# Patient Record
Sex: Female | Born: 1935 | Race: White | Hispanic: No | Marital: Married | Smoking: Never smoker
Health system: Southern US, Community
[De-identification: ages and names within clinical notes are randomized; demographics above are authoritative.]

## PROBLEM LIST (undated history)

## (undated) DIAGNOSIS — M1990 Unspecified osteoarthritis, unspecified site: Secondary | ICD-10-CM

## (undated) DIAGNOSIS — D126 Benign neoplasm of colon, unspecified: Secondary | ICD-10-CM

## (undated) DIAGNOSIS — K589 Irritable bowel syndrome without diarrhea: Secondary | ICD-10-CM

## (undated) DIAGNOSIS — Z8489 Family history of other specified conditions: Secondary | ICD-10-CM

## (undated) DIAGNOSIS — I739 Peripheral vascular disease, unspecified: Secondary | ICD-10-CM

## (undated) DIAGNOSIS — H269 Unspecified cataract: Secondary | ICD-10-CM

## (undated) DIAGNOSIS — K219 Gastro-esophageal reflux disease without esophagitis: Secondary | ICD-10-CM

## (undated) DIAGNOSIS — E538 Deficiency of other specified B group vitamins: Secondary | ICD-10-CM

## (undated) DIAGNOSIS — D510 Vitamin B12 deficiency anemia due to intrinsic factor deficiency: Secondary | ICD-10-CM

## (undated) DIAGNOSIS — I6529 Occlusion and stenosis of unspecified carotid artery: Secondary | ICD-10-CM

## (undated) DIAGNOSIS — T7840XA Allergy, unspecified, initial encounter: Secondary | ICD-10-CM

## (undated) DIAGNOSIS — I2510 Atherosclerotic heart disease of native coronary artery without angina pectoris: Secondary | ICD-10-CM

## (undated) DIAGNOSIS — E785 Hyperlipidemia, unspecified: Secondary | ICD-10-CM

## (undated) DIAGNOSIS — E039 Hypothyroidism, unspecified: Secondary | ICD-10-CM

## (undated) DIAGNOSIS — M810 Age-related osteoporosis without current pathological fracture: Secondary | ICD-10-CM

## (undated) DIAGNOSIS — R197 Diarrhea, unspecified: Secondary | ICD-10-CM

## (undated) DIAGNOSIS — I774 Celiac artery compression syndrome: Secondary | ICD-10-CM

## (undated) DIAGNOSIS — R011 Cardiac murmur, unspecified: Secondary | ICD-10-CM

## (undated) DIAGNOSIS — Z8719 Personal history of other diseases of the digestive system: Secondary | ICD-10-CM

## (undated) DIAGNOSIS — M797 Fibromyalgia: Secondary | ICD-10-CM

## (undated) DIAGNOSIS — K824 Cholesterolosis of gallbladder: Secondary | ICD-10-CM

## (undated) DIAGNOSIS — Z87442 Personal history of urinary calculi: Secondary | ICD-10-CM

## (undated) DIAGNOSIS — I10 Essential (primary) hypertension: Secondary | ICD-10-CM

## (undated) HISTORY — PX: OTHER SURGICAL HISTORY: SHX169

## (undated) HISTORY — DX: Celiac artery compression syndrome: I77.4

## (undated) HISTORY — DX: Unspecified cataract: H26.9

## (undated) HISTORY — DX: Essential (primary) hypertension: I10

## (undated) HISTORY — PX: COLONOSCOPY: SHX174

## (undated) HISTORY — PX: THYROID LOBECTOMY: SHX420

## (undated) HISTORY — DX: Benign neoplasm of colon, unspecified: D12.6

## (undated) HISTORY — DX: Hypothyroidism, unspecified: E03.9

## (undated) HISTORY — PX: UPPER GASTROINTESTINAL ENDOSCOPY: SHX188

## (undated) HISTORY — PX: ABDOMINAL HYSTERECTOMY: SHX81

## (undated) HISTORY — DX: Occlusion and stenosis of unspecified carotid artery: I65.29

## (undated) HISTORY — DX: Age-related osteoporosis without current pathological fracture: M81.0

## (undated) HISTORY — PX: BIOPSY THYROID: PRO38

## (undated) HISTORY — DX: Vitamin B12 deficiency anemia due to intrinsic factor deficiency: D51.0

## (undated) HISTORY — DX: Cholesterolosis of gallbladder: K82.4

## (undated) HISTORY — DX: Allergy, unspecified, initial encounter: T78.40XA

## (undated) HISTORY — DX: Irritable bowel syndrome, unspecified: K58.9

## (undated) HISTORY — DX: Diarrhea, unspecified: R19.7

## (undated) HISTORY — DX: Peripheral vascular disease, unspecified: I73.9

## (undated) HISTORY — PX: APPENDECTOMY: SHX54

## (undated) HISTORY — DX: Hyperlipidemia, unspecified: E78.5

## (undated) HISTORY — DX: Unspecified osteoarthritis, unspecified site: M19.90

## (undated) HISTORY — DX: Deficiency of other specified B group vitamins: E53.8

## (undated) HISTORY — DX: Atherosclerotic heart disease of native coronary artery without angina pectoris: I25.10

---

## 2000-03-05 ENCOUNTER — Other Ambulatory Visit: Admission: RE | Admit: 2000-03-05 | Discharge: 2000-03-05 | Payer: Self-pay | Admitting: Gynecology

## 2000-04-09 ENCOUNTER — Ambulatory Visit (HOSPITAL_COMMUNITY): Admission: RE | Admit: 2000-04-09 | Discharge: 2000-04-09 | Payer: Self-pay | Admitting: Internal Medicine

## 2000-04-09 ENCOUNTER — Encounter: Payer: Self-pay | Admitting: Internal Medicine

## 2000-05-27 ENCOUNTER — Encounter (INDEPENDENT_AMBULATORY_CARE_PROVIDER_SITE_OTHER): Payer: Self-pay | Admitting: Specialist

## 2000-05-27 ENCOUNTER — Ambulatory Visit (HOSPITAL_COMMUNITY): Admission: RE | Admit: 2000-05-27 | Discharge: 2000-05-27 | Payer: Self-pay | Admitting: Gastroenterology

## 2001-04-29 ENCOUNTER — Ambulatory Visit (HOSPITAL_COMMUNITY): Admission: RE | Admit: 2001-04-29 | Discharge: 2001-04-29 | Payer: Self-pay | Admitting: Internal Medicine

## 2001-04-29 ENCOUNTER — Encounter: Payer: Self-pay | Admitting: Internal Medicine

## 2004-01-25 ENCOUNTER — Other Ambulatory Visit: Admission: RE | Admit: 2004-01-25 | Discharge: 2004-01-25 | Payer: Self-pay | Admitting: Gynecology

## 2004-03-28 ENCOUNTER — Encounter: Payer: Self-pay | Admitting: Internal Medicine

## 2004-03-28 LAB — CONVERTED CEMR LAB

## 2004-07-19 ENCOUNTER — Ambulatory Visit: Payer: Self-pay | Admitting: Internal Medicine

## 2004-07-23 ENCOUNTER — Ambulatory Visit: Payer: Self-pay | Admitting: Internal Medicine

## 2004-07-30 ENCOUNTER — Ambulatory Visit: Payer: Self-pay | Admitting: Internal Medicine

## 2004-08-15 ENCOUNTER — Ambulatory Visit: Payer: Self-pay | Admitting: Gastroenterology

## 2004-08-20 ENCOUNTER — Ambulatory Visit: Payer: Self-pay | Admitting: Gastroenterology

## 2004-09-04 ENCOUNTER — Ambulatory Visit: Payer: Self-pay | Admitting: Gastroenterology

## 2004-09-04 LAB — HM COLONOSCOPY: HM Colonoscopy: NORMAL

## 2005-01-20 ENCOUNTER — Ambulatory Visit: Payer: Self-pay | Admitting: Internal Medicine

## 2005-02-04 ENCOUNTER — Ambulatory Visit: Payer: Self-pay

## 2005-04-28 HISTORY — PX: CORONARY ARTERY BYPASS GRAFT: SHX141

## 2005-05-08 ENCOUNTER — Ambulatory Visit: Payer: Self-pay | Admitting: Internal Medicine

## 2005-08-27 ENCOUNTER — Ambulatory Visit: Payer: Self-pay | Admitting: Internal Medicine

## 2005-09-17 ENCOUNTER — Ambulatory Visit: Payer: Self-pay | Admitting: Internal Medicine

## 2005-09-20 ENCOUNTER — Inpatient Hospital Stay (HOSPITAL_COMMUNITY): Admission: EM | Admit: 2005-09-20 | Discharge: 2005-10-02 | Payer: Self-pay | Admitting: Family Medicine

## 2005-09-20 ENCOUNTER — Ambulatory Visit: Payer: Self-pay | Admitting: Internal Medicine

## 2005-09-24 ENCOUNTER — Encounter: Payer: Self-pay | Admitting: Vascular Surgery

## 2005-10-13 ENCOUNTER — Ambulatory Visit: Payer: Self-pay | Admitting: Cardiology

## 2005-10-18 ENCOUNTER — Ambulatory Visit: Payer: Self-pay | Admitting: Internal Medicine

## 2005-10-23 ENCOUNTER — Encounter (HOSPITAL_COMMUNITY): Admission: RE | Admit: 2005-10-23 | Discharge: 2006-01-21 | Payer: Self-pay | Admitting: Internal Medicine

## 2005-11-19 ENCOUNTER — Ambulatory Visit: Payer: Self-pay | Admitting: Internal Medicine

## 2005-11-28 ENCOUNTER — Ambulatory Visit: Payer: Self-pay | Admitting: Internal Medicine

## 2005-12-03 ENCOUNTER — Ambulatory Visit: Payer: Self-pay

## 2005-12-03 ENCOUNTER — Encounter: Payer: Self-pay | Admitting: Internal Medicine

## 2006-01-22 ENCOUNTER — Encounter (HOSPITAL_COMMUNITY): Admission: RE | Admit: 2006-01-22 | Discharge: 2006-04-22 | Payer: Self-pay | Admitting: Internal Medicine

## 2006-01-27 ENCOUNTER — Ambulatory Visit: Payer: Self-pay | Admitting: Internal Medicine

## 2006-02-05 ENCOUNTER — Ambulatory Visit: Payer: Self-pay | Admitting: Internal Medicine

## 2006-02-05 LAB — CONVERTED CEMR LAB
ALT: 12 units/L (ref 0–40)
AST: 19 units/L (ref 0–37)
BUN: 12 mg/dL (ref 6–23)
Chol/HDL Ratio, serum: 3.6
Cholesterol: 191 mg/dL (ref 0–200)
Creatinine, Ser: 1 mg/dL (ref 0.4–1.2)
HDL: 52.5 mg/dL (ref >39.0)
LDL Cholesterol: 122 mg/dL — ABNORMAL HIGH (ref 0–99)
Potassium: 3.7 meq/L (ref 3.5–5.1)
TSH: 5.11 microintl units/mL (ref 0.35–5.50)
Triglyceride fasting, serum: 85 mg/dL (ref 0–149)
VLDL: 17 mg/dL (ref 0–40)

## 2006-06-29 ENCOUNTER — Ambulatory Visit: Payer: Self-pay | Admitting: Internal Medicine

## 2006-06-29 LAB — CONVERTED CEMR LAB
ALT: 13 units/L (ref 0–40)
AST: 20 units/L (ref 0–37)
Anti Nuclear Antibody(ANA): NEGATIVE
Cholesterol: 196 mg/dL (ref 0–200)
HDL: 82.3 mg/dL (ref >39.0)
LDL Cholesterol: 94 mg/dL (ref 0–99)
Sed Rate: 13 mm/hr (ref 0–25)
Total CHOL/HDL Ratio: 2.4
Triglycerides: 98 mg/dL (ref 0–149)
VLDL: 20 mg/dL (ref 0–40)

## 2006-11-17 ENCOUNTER — Ambulatory Visit: Payer: Self-pay | Admitting: Internal Medicine

## 2006-11-18 ENCOUNTER — Ambulatory Visit: Payer: Self-pay

## 2006-11-23 ENCOUNTER — Ambulatory Visit: Payer: Self-pay | Admitting: Internal Medicine

## 2006-12-16 ENCOUNTER — Ambulatory Visit: Payer: Self-pay | Admitting: Endocrinology

## 2007-01-08 ENCOUNTER — Encounter: Payer: Self-pay | Admitting: Internal Medicine

## 2007-01-08 DIAGNOSIS — Z862 Personal history of diseases of the blood and blood-forming organs and certain disorders involving the immune mechanism: Secondary | ICD-10-CM | POA: Insufficient documentation

## 2007-01-08 DIAGNOSIS — E785 Hyperlipidemia, unspecified: Secondary | ICD-10-CM | POA: Insufficient documentation

## 2007-01-08 DIAGNOSIS — K2940 Chronic atrophic gastritis without bleeding: Secondary | ICD-10-CM | POA: Insufficient documentation

## 2007-01-08 DIAGNOSIS — E039 Hypothyroidism, unspecified: Secondary | ICD-10-CM | POA: Insufficient documentation

## 2007-01-08 DIAGNOSIS — Z87442 Personal history of urinary calculi: Secondary | ICD-10-CM | POA: Insufficient documentation

## 2007-01-08 DIAGNOSIS — I739 Peripheral vascular disease, unspecified: Secondary | ICD-10-CM | POA: Insufficient documentation

## 2007-01-08 DIAGNOSIS — I2510 Atherosclerotic heart disease of native coronary artery without angina pectoris: Secondary | ICD-10-CM | POA: Insufficient documentation

## 2007-01-08 DIAGNOSIS — I10 Essential (primary) hypertension: Secondary | ICD-10-CM | POA: Insufficient documentation

## 2007-01-08 DIAGNOSIS — Z9089 Acquired absence of other organs: Secondary | ICD-10-CM | POA: Insufficient documentation

## 2007-01-08 DIAGNOSIS — Z9079 Acquired absence of other genital organ(s): Secondary | ICD-10-CM | POA: Insufficient documentation

## 2007-01-19 ENCOUNTER — Encounter: Payer: Self-pay | Admitting: Internal Medicine

## 2007-01-19 ENCOUNTER — Ambulatory Visit: Payer: Self-pay | Admitting: Internal Medicine

## 2007-01-25 ENCOUNTER — Ambulatory Visit: Payer: Self-pay | Admitting: Internal Medicine

## 2007-01-25 LAB — CONVERTED CEMR LAB
Glucose, 1 Hour GTT: 197 mg/dL — ABNORMAL HIGH (ref 120–170)
Glucose, 2 hour: 164 mg/dL — ABNORMAL HIGH (ref 70–139)
Glucose, Fasting: 96 mg/dL (ref 70–99)
Hgb A1c MFr Bld: 5.8 % (ref 4.6–6.0)

## 2007-03-12 ENCOUNTER — Ambulatory Visit: Payer: Self-pay | Admitting: Internal Medicine

## 2007-03-12 LAB — CONVERTED CEMR LAB
Basophils Absolute: 0.1 10*3/uL (ref 0.0–0.1)
Basophils Relative: 1.8 % — ABNORMAL HIGH (ref 0.0–1.0)
Eosinophils Absolute: 0.1 10*3/uL (ref 0.0–0.6)
Eosinophils Relative: 1.2 % (ref 0.0–5.0)
HCT: 35.8 % — ABNORMAL LOW (ref 36.0–46.0)
Hemoglobin: 12.1 g/dL (ref 12.0–15.0)
Lymphocytes Relative: 15.8 % (ref 12.0–46.0)
MCHC: 33.9 g/dL (ref 30.0–36.0)
MCV: 83.5 fL (ref 78.0–100.0)
Monocytes Absolute: 0.6 10*3/uL (ref 0.2–0.7)
Monocytes Relative: 9.8 % (ref 3.0–11.0)
Neutro Abs: 4.8 10*3/uL (ref 1.4–7.7)
Neutrophils Relative %: 71.4 % (ref 43.0–77.0)
Platelets: 143 10*3/uL — ABNORMAL LOW (ref 150–400)
RBC: 4.29 M/uL (ref 3.87–5.11)
RDW: 12.9 % (ref 11.5–14.6)
Vitamin B-12: 262 pg/mL (ref 211–911)
WBC: 6.6 10*3/uL (ref 4.5–10.5)

## 2007-03-15 ENCOUNTER — Encounter: Payer: Self-pay | Admitting: Internal Medicine

## 2007-03-18 ENCOUNTER — Encounter: Payer: Self-pay | Admitting: Internal Medicine

## 2007-04-01 LAB — HM PAP SMEAR: HM Pap smear: NORMAL

## 2007-05-04 ENCOUNTER — Ambulatory Visit: Payer: Self-pay | Admitting: Internal Medicine

## 2007-05-06 ENCOUNTER — Ambulatory Visit: Payer: Self-pay | Admitting: Internal Medicine

## 2007-05-11 ENCOUNTER — Ambulatory Visit: Payer: Self-pay | Admitting: Gastroenterology

## 2007-05-11 LAB — CONVERTED CEMR LAB
ALT: 16 units/L (ref 0–35)
AST: 25 units/L (ref 0–37)
Albumin: 3.8 g/dL (ref 3.5–5.2)
Alkaline Phosphatase: 43 units/L (ref 39–117)
BUN: 18 mg/dL (ref 6–23)
Basophils Absolute: 0.1 10*3/uL (ref 0.0–0.1)
Basophils Relative: 2.6 % — ABNORMAL HIGH (ref 0.0–1.0)
Bilirubin, Direct: 0.1 mg/dL (ref 0.0–0.3)
CO2: 30 meq/L (ref 19–32)
Calcium: 9.1 mg/dL (ref 8.4–10.5)
Chloride: 106 meq/L (ref 96–112)
Creatinine, Ser: 0.7 mg/dL (ref 0.4–1.2)
Eosinophils Absolute: 0.1 10*3/uL (ref 0.0–0.6)
Eosinophils Relative: 1.8 % (ref 0.0–5.0)
GFR calc Af Amer: 106 mL/min
GFR calc non Af Amer: 87 mL/min
Glucose, Bld: 106 mg/dL — ABNORMAL HIGH (ref 70–99)
HCT: 35.9 % — ABNORMAL LOW (ref 36.0–46.0)
Hemoglobin: 12.1 g/dL (ref 12.0–15.0)
Lymphocytes Relative: 23.3 % (ref 12.0–46.0)
MCHC: 33.6 g/dL (ref 30.0–36.0)
MCV: 82.3 fL (ref 78.0–100.0)
Monocytes Absolute: 0.3 10*3/uL (ref 0.2–0.7)
Monocytes Relative: 6.8 % (ref 3.0–11.0)
Neutro Abs: 3.1 10*3/uL (ref 1.4–7.7)
Neutrophils Relative %: 65.5 % (ref 43.0–77.0)
Platelets: 162 10*3/uL (ref 150–400)
Potassium: 3.5 meq/L (ref 3.5–5.1)
RBC: 4.36 M/uL (ref 3.87–5.11)
RDW: 14.4 % (ref 11.5–14.6)
Sodium: 143 meq/L (ref 135–145)
TSH: 1.28 microintl units/mL (ref 0.35–5.50)
Total Bilirubin: 0.7 mg/dL (ref 0.3–1.2)
Total Protein: 6 g/dL (ref 6.0–8.3)
WBC: 4.7 10*3/uL (ref 4.5–10.5)

## 2007-05-14 ENCOUNTER — Ambulatory Visit: Payer: Self-pay | Admitting: Gastroenterology

## 2007-05-14 ENCOUNTER — Encounter: Payer: Self-pay | Admitting: Internal Medicine

## 2007-05-17 ENCOUNTER — Ambulatory Visit (HOSPITAL_COMMUNITY): Admission: RE | Admit: 2007-05-17 | Discharge: 2007-05-17 | Payer: Self-pay | Admitting: Gastroenterology

## 2007-05-17 ENCOUNTER — Ambulatory Visit: Payer: Self-pay | Admitting: Internal Medicine

## 2007-05-20 ENCOUNTER — Ambulatory Visit: Payer: Self-pay | Admitting: Gastroenterology

## 2007-05-20 LAB — CONVERTED CEMR LAB: CA 125: 7.1 units/mL (ref 0.0–30.2)

## 2007-05-25 ENCOUNTER — Ambulatory Visit: Payer: Self-pay | Admitting: Cardiology

## 2007-05-27 ENCOUNTER — Encounter: Payer: Self-pay | Admitting: Internal Medicine

## 2007-05-31 ENCOUNTER — Telehealth: Payer: Self-pay | Admitting: Internal Medicine

## 2007-06-21 ENCOUNTER — Ambulatory Visit: Payer: Self-pay | Admitting: Internal Medicine

## 2007-06-24 ENCOUNTER — Encounter: Payer: Self-pay | Admitting: Internal Medicine

## 2007-07-01 ENCOUNTER — Ambulatory Visit: Payer: Self-pay | Admitting: Cardiovascular Disease

## 2007-07-19 ENCOUNTER — Encounter: Payer: Self-pay | Admitting: Internal Medicine

## 2007-09-02 ENCOUNTER — Ambulatory Visit: Payer: Self-pay | Admitting: *Deleted

## 2007-09-24 ENCOUNTER — Ambulatory Visit: Payer: Self-pay | Admitting: Internal Medicine

## 2007-09-24 DIAGNOSIS — M79609 Pain in unspecified limb: Secondary | ICD-10-CM | POA: Insufficient documentation

## 2007-09-30 ENCOUNTER — Ambulatory Visit: Payer: Self-pay

## 2007-10-25 ENCOUNTER — Ambulatory Visit: Payer: Self-pay | Admitting: Internal Medicine

## 2007-10-25 DIAGNOSIS — I774 Celiac artery compression syndrome: Secondary | ICD-10-CM | POA: Insufficient documentation

## 2007-12-15 ENCOUNTER — Telehealth: Payer: Self-pay | Admitting: Internal Medicine

## 2007-12-26 ENCOUNTER — Telehealth: Payer: Self-pay | Admitting: Internal Medicine

## 2007-12-31 ENCOUNTER — Ambulatory Visit: Payer: Self-pay | Admitting: Internal Medicine

## 2008-01-07 ENCOUNTER — Ambulatory Visit: Payer: Self-pay

## 2008-01-25 ENCOUNTER — Telehealth: Payer: Self-pay | Admitting: Internal Medicine

## 2008-02-14 ENCOUNTER — Telehealth: Payer: Self-pay | Admitting: Internal Medicine

## 2008-02-18 ENCOUNTER — Ambulatory Visit: Payer: Self-pay | Admitting: Internal Medicine

## 2008-03-04 ENCOUNTER — Encounter: Payer: Self-pay | Admitting: Internal Medicine

## 2008-04-13 ENCOUNTER — Ambulatory Visit: Payer: Self-pay | Admitting: *Deleted

## 2008-07-19 ENCOUNTER — Telehealth: Payer: Self-pay | Admitting: Internal Medicine

## 2008-08-15 ENCOUNTER — Telehealth: Payer: Self-pay | Admitting: Internal Medicine

## 2008-08-18 ENCOUNTER — Ambulatory Visit: Payer: Self-pay | Admitting: Internal Medicine

## 2008-08-19 ENCOUNTER — Encounter: Payer: Self-pay | Admitting: Internal Medicine

## 2008-08-21 LAB — CONVERTED CEMR LAB
ALT: 27 units/L (ref 0–35)
AST: 40 units/L — ABNORMAL HIGH (ref 0–37)
Albumin: 4.1 g/dL (ref 3.5–5.2)
Alkaline Phosphatase: 57 units/L (ref 39–117)
BUN: 20 mg/dL (ref 6–23)
Basophils Absolute: 0 10*3/uL (ref 0.0–0.1)
Basophils Relative: 1 % (ref 0.0–3.0)
Bilirubin Urine: NEGATIVE
Bilirubin, Direct: 0.1 mg/dL (ref 0.0–0.3)
CO2: 32 meq/L (ref 19–32)
Calcium: 8.9 mg/dL (ref 8.4–10.5)
Chloride: 107 meq/L (ref 96–112)
Creatinine, Ser: 0.7 mg/dL (ref 0.4–1.2)
Eosinophils Absolute: 0.1 10*3/uL (ref 0.0–0.7)
Eosinophils Relative: 2.1 % (ref 0.0–5.0)
GFR calc non Af Amer: 86.99 mL/min (ref >60)
Glucose, Bld: 97 mg/dL (ref 70–99)
H Pylori IgG: NEGATIVE
HCT: 35.4 % — ABNORMAL LOW (ref 36.0–46.0)
Hemoglobin: 12 g/dL (ref 12.0–15.0)
Hgb A1c MFr Bld: 6 % (ref 4.6–6.5)
Ketones, ur: NEGATIVE mg/dL
Leukocytes, UA: NEGATIVE
Lipase: 46 units/L (ref 11.0–59.0)
Lymphocytes Relative: 20.2 % (ref 12.0–46.0)
Lymphs Abs: 0.8 10*3/uL (ref 0.7–4.0)
MCHC: 33.8 g/dL (ref 30.0–36.0)
MCV: 86.6 fL (ref 78.0–100.0)
Monocytes Absolute: 0.3 10*3/uL (ref 0.1–1.0)
Monocytes Relative: 7.7 % (ref 3.0–12.0)
Neutro Abs: 2.9 10*3/uL (ref 1.4–7.7)
Neutrophils Relative %: 69 % (ref 43.0–77.0)
Nitrite: NEGATIVE
Platelets: 161 10*3/uL (ref 150.0–400.0)
Potassium: 3.4 meq/L — ABNORMAL LOW (ref 3.5–5.1)
RBC: 4.08 M/uL (ref 3.87–5.11)
RDW: 13.4 % (ref 11.5–14.6)
Sodium: 146 meq/L — ABNORMAL HIGH (ref 135–145)
Specific Gravity, Urine: <=1.005 (ref 1.000–1.030)
TSH: 0.84 microintl units/mL (ref 0.35–5.50)
Total Bilirubin: 0.8 mg/dL (ref 0.3–1.2)
Total Protein, Urine: NEGATIVE mg/dL
Total Protein: 7.1 g/dL (ref 6.0–8.3)
Urine Glucose: NEGATIVE mg/dL
Urobilinogen, UA: 0.2 (ref 0.0–1.0)
WBC: 4.1 10*3/uL — ABNORMAL LOW (ref 4.5–10.5)
pH: 6.5 (ref 5.0–8.0)

## 2008-08-22 ENCOUNTER — Ambulatory Visit: Payer: Self-pay | Admitting: Gastroenterology

## 2008-09-20 ENCOUNTER — Ambulatory Visit: Payer: Self-pay | Admitting: Internal Medicine

## 2008-09-27 ENCOUNTER — Ambulatory Visit: Payer: Self-pay | Admitting: Internal Medicine

## 2008-09-27 DIAGNOSIS — H8110 Benign paroxysmal vertigo, unspecified ear: Secondary | ICD-10-CM | POA: Insufficient documentation

## 2008-09-28 ENCOUNTER — Ambulatory Visit: Payer: Self-pay | Admitting: *Deleted

## 2009-01-16 ENCOUNTER — Ambulatory Visit: Payer: Self-pay

## 2009-01-16 ENCOUNTER — Encounter: Payer: Self-pay | Admitting: Internal Medicine

## 2009-01-18 ENCOUNTER — Telehealth: Payer: Self-pay | Admitting: Internal Medicine

## 2009-01-19 ENCOUNTER — Telehealth: Payer: Self-pay | Admitting: Internal Medicine

## 2009-02-01 ENCOUNTER — Telehealth: Payer: Self-pay | Admitting: Internal Medicine

## 2009-02-05 ENCOUNTER — Ambulatory Visit: Payer: Self-pay | Admitting: Internal Medicine

## 2009-02-05 DIAGNOSIS — I498 Other specified cardiac arrhythmias: Secondary | ICD-10-CM | POA: Insufficient documentation

## 2009-02-07 LAB — CONVERTED CEMR LAB
ALT: 21 units/L (ref 0–35)
AST: 31 units/L (ref 0–37)
Albumin: 4.4 g/dL (ref 3.5–5.2)
Alkaline Phosphatase: 52 units/L (ref 39–117)
BUN: 16 mg/dL (ref 6–23)
Basophils Absolute: 0 10*3/uL (ref 0.0–0.1)
Basophils Relative: 0.6 % (ref 0.0–3.0)
Bilirubin, Direct: 0 mg/dL (ref 0.0–0.3)
CO2: 29 meq/L (ref 19–32)
Calcium: 9.5 mg/dL (ref 8.4–10.5)
Chloride: 103 meq/L (ref 96–112)
Creatinine, Ser: 0.9 mg/dL (ref 0.4–1.2)
Eosinophils Absolute: 0.1 10*3/uL (ref 0.0–0.7)
Eosinophils Relative: 1.1 % (ref 0.0–5.0)
GFR calc non Af Amer: 65 mL/min (ref >60)
Glucose, Bld: 80 mg/dL (ref 70–99)
HCT: 35.2 % — ABNORMAL LOW (ref 36.0–46.0)
Hemoglobin: 11.8 g/dL — ABNORMAL LOW (ref 12.0–15.0)
Lymphocytes Relative: 21.6 % (ref 12.0–46.0)
Lymphs Abs: 1.1 10*3/uL (ref 0.7–4.0)
MCHC: 33.5 g/dL (ref 30.0–36.0)
MCV: 87.1 fL (ref 78.0–100.0)
Monocytes Absolute: 0.5 10*3/uL (ref 0.1–1.0)
Monocytes Relative: 10.6 % (ref 3.0–12.0)
Neutro Abs: 3.4 10*3/uL (ref 1.4–7.7)
Neutrophils Relative %: 66.1 % (ref 43.0–77.0)
Platelets: 168 10*3/uL (ref 150.0–400.0)
Potassium: 4.3 meq/L (ref 3.5–5.1)
RBC: 4.04 M/uL (ref 3.87–5.11)
RDW: 14 % (ref 11.5–14.6)
Sodium: 142 meq/L (ref 135–145)
TSH: 0.62 microintl units/mL (ref 0.35–5.50)
Total Bilirubin: 0.8 mg/dL (ref 0.3–1.2)
Total Protein: 7.4 g/dL (ref 6.0–8.3)
WBC: 5.1 10*3/uL (ref 4.5–10.5)

## 2009-02-19 ENCOUNTER — Telehealth: Payer: Self-pay | Admitting: Internal Medicine

## 2009-03-15 ENCOUNTER — Telehealth: Payer: Self-pay | Admitting: Internal Medicine

## 2009-03-19 ENCOUNTER — Ambulatory Visit: Payer: Self-pay | Admitting: Internal Medicine

## 2009-04-16 ENCOUNTER — Telehealth: Payer: Self-pay | Admitting: Internal Medicine

## 2009-05-16 ENCOUNTER — Ambulatory Visit: Payer: Self-pay | Admitting: Family Medicine

## 2009-05-16 ENCOUNTER — Encounter: Payer: Self-pay | Admitting: Internal Medicine

## 2009-05-28 ENCOUNTER — Encounter: Payer: Self-pay | Admitting: Internal Medicine

## 2009-06-07 ENCOUNTER — Ambulatory Visit: Payer: Self-pay | Admitting: Internal Medicine

## 2009-06-07 DIAGNOSIS — M810 Age-related osteoporosis without current pathological fracture: Secondary | ICD-10-CM | POA: Insufficient documentation

## 2009-06-07 LAB — CONVERTED CEMR LAB
Calcium: 9.5 mg/dL (ref 8.4–10.5)
Creatinine, Ser: 0.8 mg/dL (ref 0.4–1.2)

## 2009-06-08 ENCOUNTER — Telehealth: Payer: Self-pay | Admitting: Internal Medicine

## 2009-06-12 ENCOUNTER — Encounter: Payer: Self-pay | Admitting: Internal Medicine

## 2009-06-28 ENCOUNTER — Ambulatory Visit (HOSPITAL_COMMUNITY): Admission: RE | Admit: 2009-06-28 | Discharge: 2009-06-28 | Payer: Self-pay | Admitting: Orthopedic Surgery

## 2009-07-30 ENCOUNTER — Ambulatory Visit: Payer: Self-pay | Admitting: Internal Medicine

## 2009-07-30 LAB — CONVERTED CEMR LAB
Bilirubin Urine: NEGATIVE
Blood in Urine, dipstick: NEGATIVE
Glucose, Urine, Semiquant: NEGATIVE
Ketones, urine, test strip: NEGATIVE
Nitrite: NEGATIVE
Protein, U semiquant: NEGATIVE
Specific Gravity, Urine: <1.005
Urobilinogen, UA: 0.2
WBC Urine, dipstick: NEGATIVE
pH: 5

## 2009-08-14 ENCOUNTER — Encounter: Payer: Self-pay | Admitting: Internal Medicine

## 2009-08-27 ENCOUNTER — Ambulatory Visit: Payer: Self-pay | Admitting: Internal Medicine

## 2009-08-27 DIAGNOSIS — I6529 Occlusion and stenosis of unspecified carotid artery: Secondary | ICD-10-CM | POA: Insufficient documentation

## 2009-09-20 ENCOUNTER — Telehealth: Payer: Self-pay | Admitting: Internal Medicine

## 2009-10-19 ENCOUNTER — Telehealth: Payer: Self-pay | Admitting: Internal Medicine

## 2010-01-01 ENCOUNTER — Encounter: Payer: Self-pay | Admitting: Internal Medicine

## 2010-01-01 ENCOUNTER — Ambulatory Visit: Payer: Self-pay

## 2010-01-02 ENCOUNTER — Ambulatory Visit: Payer: Self-pay | Admitting: Internal Medicine

## 2010-01-02 DIAGNOSIS — IMO0002 Reserved for concepts with insufficient information to code with codable children: Secondary | ICD-10-CM | POA: Insufficient documentation

## 2010-01-08 ENCOUNTER — Telehealth: Payer: Self-pay | Admitting: Internal Medicine

## 2010-01-09 ENCOUNTER — Ambulatory Visit: Payer: Self-pay | Admitting: Internal Medicine

## 2010-01-09 ENCOUNTER — Telehealth (INDEPENDENT_AMBULATORY_CARE_PROVIDER_SITE_OTHER): Payer: Self-pay | Admitting: *Deleted

## 2010-02-26 LAB — HM MAMMOGRAPHY: HM Mammogram: NORMAL

## 2010-04-08 ENCOUNTER — Telehealth: Payer: Self-pay | Admitting: Internal Medicine

## 2010-04-24 ENCOUNTER — Telehealth: Payer: Self-pay | Admitting: Internal Medicine

## 2010-05-20 ENCOUNTER — Encounter: Payer: Self-pay | Admitting: Internal Medicine

## 2010-05-28 NOTE — Progress Notes (Signed)
Summary: RESULTS  Phone Note Call from Patient Call back at Home Phone [phone]   Caller: Patient Call For: Jacques Navy MD Summary of Call: Pt requesting results of xrays done last week. Please advise. Initial call taken by: Verdell Face,  January 08, 2010 10:49 AM  Follow-up for Phone Call        called pt - still with a lot of pain. will see tomorrow at 11:15 Follow-up by: Jacques Navy MD,  January 08, 2010 6:36 PM

## 2010-05-28 NOTE — Progress Notes (Signed)
  Phone Note Refill Request Message from:  Fax from Pharmacy on Sep 20, 2009 4:27 PM  Refills Requested: Medication #1:  FUROSEMIDE 40 MG  TABS 1 by mouth  once daily Initial call taken by: Ami Bullins CMA,  Sep 20, 2009 4:27 PM    Prescriptions: FUROSEMIDE 40 MG  TABS (FUROSEMIDE) 1 by mouth  once daily  #30 x 6   Entered by:   Ami Bullins CMA   Authorized by:   Jacques Navy MD   Signed by:   Bill Salinas CMA on 09/20/2009   Method used:   Electronically to        [facility]* (retail)       [address]       [place], [place]  [address]       Ph: [phone]       Fax: [phone]   RxID:   [id number]

## 2010-05-28 NOTE — Procedures (Signed)
Summary: Colonoscopy   Colonoscopy  Procedure date:  09/04/2004  Findings:      Results: Normal. Location:  [facility].    Procedures Next Due Date:    Colonoscopy: 08/2009  Patient Name: [name], [name] MRN: [id number] Procedure Procedures: Colonoscopy CPT: 40981.  Personnel: Endoscopist: Vania Rea. Jarold Motto, MD.  Exam Location: Exam performed in Outpatient Clinic. Outpatient  Patient Consent: Procedure, Alternatives, Risks and Benefits discussed, consent obtained, from patient. Consent was obtained by the RN.  Indications  Surveillance of: Adenomatous Polyp(s).  History  Current Medications: Patient is not currently taking Coumadin.  Pre-Exam Physical: Performed Sep 04, 2004. Cardio-pulmonary exam, Rectal exam, Abdominal exam, Extremity exam, Mental status exam WNL.  Exam Exam: Extent of exam reached: Cecum, extent intended: Cecum.  The cecum was identified by appendiceal orifice and IC valve. Patient position: on left side. Duration of exam: 20 minutes. Colon retroflexion performed. Images taken. ASA Classification: II. Tolerance: excellent.  Monitoring: Pulse and BP monitoring, Oximetry used. Supplemental O2 given. at 2 Liters.  Colon Prep Used Golytely for colon prep. Prep results: excellent.  Sedation Meds: Patient assessed and found to be appropriate for moderate (conscious) sedation. Fentanyl 50 mcg. given IV. Versed 5 mg. given IV.  Instrument(s): CF 140L. Serial [id number].  Findings - NORMAL EXAM: Cecum to Rectum. Not Seen: Polyps. AVM's. Colitis. Tumors. Melanosis. Crohn's. Diverticulosis. Hemorrhoids.   Assessment Normal examination.  Events  Unplanned Interventions: No intervention was required.  Plans Medication Plan: Referring provider to order medications.  Patient Education: Patient given standard instructions for: Patient instructed to get routine colonoscopy every 5 years.  Disposition: After procedure patient  sent to recovery. After recovery patient sent home.  Scheduling/Referral: Follow-Up prn.   CC:   Illene Regulus, MD  This report was created from the original endoscopy report, which was reviewed and signed by the above listed endoscopist.

## 2010-05-28 NOTE — Progress Notes (Signed)
Summary: benicar  Phone Note Refill Request Message from:  Fax from Pharmacy on August 15, 2008 4:18 PM  Refills Requested: Medication #1:  BENICAR 40 MG TABS Take 1 tablet by mouth once a day   Last Refilled: 07/15/2008 [address] [phone]   Method Requested: Electronic Initial call taken by: Orlan Leavens,  August 15, 2008 4:18 PM      Prescriptions: BENICAR 40 MG TABS (OLMESARTAN MEDOXOMIL) Take 1 tablet by mouth once a day  #30 x 2   Entered by:   Orlan Leavens   Authorized by:   Jacques Navy MD   Signed by:   Orlan Leavens on 08/15/2008   Method used:   Electronically to        [facility]* (retail)       [address]       [place], [place]  [address]       Ph: [phone]       Fax: [phone]   RxID:   [id number]

## 2010-05-28 NOTE — Assessment & Plan Note (Signed)
Summary: per flag/md 1115 appt--stc   Vital Signs:  Patient profile:   77 year old female Height:      65 inches Weight:      111 pounds BMI:     18.54 O2 Sat:      98 % on Room air Temp:     98.1 degrees F oral Pulse rate:   54 / minute BP sitting:   140 / 64  (left arm) Cuff size:   regular  Vitals Entered By: Bill Salinas CMA (January 09, 2010 11:55 AM)  O2 Flow:  Room air CC: follow up visit on back pain/ ab   Primary Care Provider:  Illene Regulus, MD  CC:  follow up visit on back pain/ ab.  History of Present Illness: Patient seen by Dr. Yetta Barre for back pain - note reviewed. She did come to have lumbar spine films that reveal DDD L3-4. She was started on etodolac and Vicodin. She still has pain but it does seem a little better. She describes a shooting discomfort  down the anterior thigh, to the knee, to the foot. She also has pain in the right buttock. She denies muscle weakness, loss of sensation. Her discomfort has limited her activities.   Current Medications (verified): 1)  Benicar 40 Mg Tabs (Olmesartan Medoxomil) .... Take 1 Tablet By Mouth Once A Day 2)  Synthroid 75 Mcg Tabs (Levothyroxine Sodium) .... Take One Tablet By Mouth Once Daily. 3)  Aspirin 81 Mg  Tabs (Aspirin) .... Take One Tablet Daily 4)  Carafate 1 Gm  Tabs (Sucralfate) .... Take As Needed 5)  Neuro B-12 Forte S 1000 Mcg/ml Inj Soln (Cyanocobalamin) .... Take Monthly 6)  Coreg 12.5 Mg Tabs (Carvedilol) .... 1/2 Tab  By Mouth Two Times A Day 7)  Ayr Saline Nasal Drops 0.65 %  Soln (Saline) .... As Needed 8)  Furosemide 40 Mg  Tabs (Furosemide) .Marland Kitchen.. 1 By Mouth  Once Daily 9)  Actos 30 Mg  Tabs (Pioglitazone Hcl) .... Take 1 Tablet By Mouth Once A Day 10)  Lipitor 80 Mg  Tabs (Atorvastatin Calcium) .... Once Daily 11)  Florastor 250 Mg Caps (Saccharomyces Boulardii) .... Take One By Mouth Once Daily 12)  Norvasc 10 Mg Tabs (Amlodipine Besylate) .... Take 1 Tablet By Mouth Once A Day 13)  Calcium  .Marland Kitchen.. 1 By Mouth Daily 14)  Icaps  Caps (Multiple Vitamins-Minerals) .... Take 1 Capsule By Mouth Once A Day. 15)  Etodolac 400 Mg Tabs (Etodolac) .... One By Mouth Two Times A Day With Food As Needed For Low Back Pain 16)  Hydrocodone-Acetaminophen 7.5-500 Mg Tabs (Hydrocodone-Acetaminophen) .... One By Mouth Three Times A Day As Needed For Low Back Pain  Allergies (verified): 1)  ! Sulfa 2)  ! Codeine  Past History:  Past Medical History: Last updated: 08/27/2009 APPENDECTOMY, HX OF (ICD-V45.79) HYSTERECTOMY, HX OF (ICD-V45.77) RENAL CALCULUS, HX OF (ICD-V13.01) PERIPHERAL VASCULAR DISEASE (ICD-443.9) ANEMIA, PERNICIOUS, HX OF (ICD-V12.3) ATROPHIC GASTRITIS (ICD-535.10) HYPOTHYROIDISM (ICD-244.9) HYPERLIPIDEMIA (ICD-272.4) HYPERTENSION (ICD-401.9) CORONARY ARTERY DISEASE (ICD-414.00) s/p cabg Carotid artery stenosis    --u/s 9/10: 40-59% Bilaterally. stable.  Weight loss Median Arcuate Ligament - follwed by Dr. Madilyn Fireman.   Past Surgical History: Last updated: 08/22/2008 Coronary artery bypass graft June '07 LIMA-LAD, SVX-Cx Hysterectomy needle bx thyroid Appendectomy thyroid right lobectomy  Family History: Last updated: 08/22/2008 Family History Hypertension Family History of Breast Cancer: Neice No FH of Colon Cancer: Family History of Prostate Cancer:  Brother Family History of Colon  Polyps: Aunt Family History of Diabetes: Grandmother, Aunt, Uncle Family History of Heart Disease: Mother Family History of Kidney Disease: Sister??  Social History: Last updated: 08/22/2008 Retired Never Smoked Alcohol Use - no Illicit Drug Use - no Patient gets regular exercise.  Review of Systems       The patient complains of difficulty walking.  The patient denies anorexia, fever, weight loss, weight gain, decreased hearing, chest pain, syncope, dyspnea on exertion, headaches, abdominal pain, incontinence, muscle weakness, abnormal bleeding, and enlarged lymph nodes.     Physical Exam  General:  Well-developed,well-nourished,in no acute distress; alert,appropriate and cooperative throughout examination Head:  normocephalic and atraumatic.   Eyes:  C&S clear Neck:  supple.   Lungs:  normal respiratory effort.   Heart:  normal rate and regular rhythm.   Msk:  back exam: able to stand w/o assist; flex to 100 degrees before pain; nl gait, toe walk. Mild discomfort in the back with heel walk. Able to step-up to exam table w/o assist. Nl SLR sitting, nl DTR patellar tendon. Nl sensation to light touch. Decreased deep vibratory sensation at right ankle and foot. No CVAT. Minimal tenderness to deep palpation right buttock.  Neurologic:  alert & oriented X3 and cranial nerves II-XII intact.   Skin:  turgor normal and color normal.   Psych:  Oriented X3, normally interactive, and good eye contact.     Impression & Recommendations:  Problem # 1:  LOW BACK PAIN, ACUTE (ICD-724.2) Patient with low back pain and DDD at L3-4 by plain x-ray. Exam without radiculopathy! Pain is better  Plan - no indication of radiculopathy or need for MRI           continue NSAID and vicodin-limit as much as possible           OK to resume stretch and exercise           call for increased pain or progressive paresthesia.  Her updated medication list for this problem includes:    Aspirin 81 Mg Tabs (Aspirin) .Marland Kitchen... Take one tablet daily    Etodolac 400 Mg Tabs (Etodolac) ..... One by mouth two times a day with food as needed for low back pain    Hydrocodone-acetaminophen 7.5-500 Mg Tabs (Hydrocodone-acetaminophen) ..... One by mouth three times a day as needed for low back pain  Complete Medication List: 1)  Benicar 40 Mg Tabs (Olmesartan medoxomil) .... Take 1 tablet by mouth once a day 2)  Synthroid 75 Mcg Tabs (Levothyroxine sodium) .... Take one tablet by mouth once daily. 3)  Aspirin 81 Mg Tabs (Aspirin) .... Take one tablet daily 4)  Carafate 1 Gm Tabs (Sucralfate) .... Take  as needed 5)  Neuro B-12 Forte S 1000 Mcg/ml Inj Soln (Cyanocobalamin) .... Take monthly 6)  Coreg 12.5 Mg Tabs (Carvedilol) .... 1/2 tab  by mouth two times a day 7)  Ayr Saline Nasal Drops 0.65 % Soln (Saline) .... As needed 8)  Furosemide 40 Mg Tabs (Furosemide) .Marland Kitchen.. 1 by mouth  once daily 9)  Actos 30 Mg Tabs (Pioglitazone hcl) .... Take 1 tablet by mouth once a day 10)  Lipitor 80 Mg Tabs (Atorvastatin calcium) .... Once daily 11)  Florastor 250 Mg Caps (Saccharomyces boulardii) .... Take one by mouth once daily 12)  Norvasc 10 Mg Tabs (Amlodipine besylate) .... Take 1 tablet by mouth once a day 13)  Calcium  .Marland KitchenMarland Kitchen. 1 by mouth daily 14)  Icaps Caps (Multiple vitamins-minerals) .... Take 1 capsule  by mouth once a day. 15)  Etodolac 400 Mg Tabs (Etodolac) .... One by mouth two times a day with food as needed for low back pain 16)  Hydrocodone-acetaminophen 7.5-500 Mg Tabs (Hydrocodone-acetaminophen) .... One by mouth three times a day as needed for low back pain

## 2010-05-28 NOTE — Op Note (Signed)
Summary: [facility]  [facility]   Imported By: Lester [place] 06/14/2009 09:06:17  _____________________________________________________________________  External Attachment:    Type:   Image     Comment:   External Document

## 2010-05-28 NOTE — Miscellaneous (Signed)
Summary: Orders Update   Clinical Lists Changes  Orders: Added new Referral order of Radiology Referral (Radiology) - Signed 

## 2010-05-28 NOTE — Medication Information (Signed)
Summary: [facility]   Imported By: Lester [place] 06/14/2009 09:03:27  _____________________________________________________________________  External Attachment:    Type:   Image     Comment:   External Document

## 2010-05-28 NOTE — Assessment & Plan Note (Signed)
Summary: DR MEN PT/NO SLOT--LOWER BACK PAIN---STC--Rm 9   Vital Signs:  Patient profile:   77 year old female Height:      65 inches Weight:      110.25 pounds BMI:     18.41 O2 Sat:      96 % on Room air Temp:     98.0 degrees F oral Pulse rate:   60 / minute Pulse rhythm:   regular Resp:     18 per minute BP sitting:   130 / 54  (left arm) Cuff size:   regular  Vitals Entered By: Mervin Kung CMA Duncan Dull) (January 02, 2010 11:37 AM)  O2 Flow:  Room air CC: Rm 9  Pt states she has low back pain radiating to right leg x 1 week. OTC Tylenol not helping the pain., Back pain Pain Assessment Patient in pain? yes     Location: lower back pain radiating to right leg Onset of pain  x 1 week   Primary Care Provider:  Illene Regulus, MD  CC:  Rm 9  Pt states she has low back pain radiating to right leg x 1 week. OTC Tylenol not helping the pain. and Back pain.  History of Present Illness:  Back Pain      This is a 77 year old woman who presents with Back pain.  The symptoms began 1 week ago.  The intensity is described as moderate.  The patient reports rest pain, but denies fever, chills, weakness, loss of sensation, fecal incontinence, urinary incontinence, urinary retention, dysuria, inability to work, and inability to care for self.  The pain is located in the right low back.  The pain began gradually.  The pain radiates to the right leg below the knee.  The pain is made worse by flexion.  The pain is made better by inactivity and heat.  Risk factors for serious underlying conditions include age >= 50 years and history of osteoporosis.    Preventive Screening-Counseling & Management  Alcohol-Tobacco     Alcohol drinks/day: 0     Smoking Status: never     Tobacco Counseling: not indicated; no tobacco use  Hep-HIV-STD-Contraception     Hepatitis Risk: no risk noted     HIV Risk: no risk noted     STD Risk: no risk noted      Drug Use:  no.    Medications Prior to  Update: 1)  Benicar 40 Mg Tabs (Olmesartan Medoxomil) .... Take 1 Tablet By Mouth Once A Day 2)  Synthroid 75 Mcg Tabs (Levothyroxine Sodium) .... Take One Tablet By Mouth Once Daily. 3)  Aspirin 81 Mg  Tabs (Aspirin) .... Take One Tablet Daily 4)  Carafate 1 Gm  Tabs (Sucralfate) .... Take As Needed 5)  Neuro B-12 Forte S 1000 Mcg/ml Inj Soln (Cyanocobalamin) .... Take Monthly 6)  Coreg 12.5 Mg Tabs (Carvedilol) .... 1/2 Tab  By Mouth Two Times A Day 7)  Ayr Saline Nasal Drops 0.65 %  Soln (Saline) .... As Needed 8)  Furosemide 40 Mg  Tabs (Furosemide) .Marland Kitchen.. 1 By Mouth  Once Daily 9)  Actos 30 Mg  Tabs (Pioglitazone Hcl) .... Take 1 Tablet By Mouth Once A Day 10)  Lipitor 80 Mg  Tabs (Atorvastatin Calcium) .... Once Daily 11)  Florastor 250 Mg Caps (Saccharomyces Boulardii) .... Take One By Mouth Once Daily 12)  Norvasc 10 Mg Tabs (Amlodipine Besylate) .... Take 1 Tablet By Mouth Once A Day 13)  Calcium .Marland KitchenMarland KitchenMarland Kitchen  1 By Mouth Daily  Current Medications (verified): 1)  Benicar 40 Mg Tabs (Olmesartan Medoxomil) .... Take 1 Tablet By Mouth Once A Day 2)  Synthroid 75 Mcg Tabs (Levothyroxine Sodium) .... Take One Tablet By Mouth Once Daily. 3)  Aspirin 81 Mg  Tabs (Aspirin) .... Take One Tablet Daily 4)  Carafate 1 Gm  Tabs (Sucralfate) .... Take As Needed 5)  Neuro B-12 Forte S 1000 Mcg/ml Inj Soln (Cyanocobalamin) .... Take Monthly 6)  Coreg 12.5 Mg Tabs (Carvedilol) .... 1/2 Tab  By Mouth Two Times A Day 7)  Ayr Saline Nasal Drops 0.65 %  Soln (Saline) .... As Needed 8)  Furosemide 40 Mg  Tabs (Furosemide) .Marland Kitchen.. 1 By Mouth  Once Daily 9)  Actos 30 Mg  Tabs (Pioglitazone Hcl) .... Take 1 Tablet By Mouth Once A Day 10)  Lipitor 80 Mg  Tabs (Atorvastatin Calcium) .... Once Daily 11)  Florastor 250 Mg Caps (Saccharomyces Boulardii) .... Take One By Mouth Once Daily 12)  Norvasc 10 Mg Tabs (Amlodipine Besylate) .... Take 1 Tablet By Mouth Once A Day 13)  Calcium .Marland Kitchen.. 1 By Mouth Daily 14)  Icaps  Caps  (Multiple Vitamins-Minerals) .... Take 1 Capsule By Mouth Once A Day. 15)  Etodolac 400 Mg Tabs (Etodolac) .... One By Mouth Two Times A Day With Food As Needed For Low Back Pain 16)  Hydrocodone-Acetaminophen 7.5-500 Mg Tabs (Hydrocodone-Acetaminophen) .... One By Mouth Three Times A Day As Needed For Low Back Pain  Allergies: 1)  ! Sulfa 2)  ! Codeine  Past History:  Past Medical History: Last updated: 08/27/2009 APPENDECTOMY, HX OF (ICD-V45.79) HYSTERECTOMY, HX OF (ICD-V45.77) RENAL CALCULUS, HX OF (ICD-V13.01) PERIPHERAL VASCULAR DISEASE (ICD-443.9) ANEMIA, PERNICIOUS, HX OF (ICD-V12.3) ATROPHIC GASTRITIS (ICD-535.10) HYPOTHYROIDISM (ICD-244.9) HYPERLIPIDEMIA (ICD-272.4) HYPERTENSION (ICD-401.9) CORONARY ARTERY DISEASE (ICD-414.00) s/p cabg Carotid artery stenosis    --u/s 9/10: 40-59% Bilaterally. stable.  Weight loss Median Arcuate Ligament - follwed by Dr. Madilyn Fireman.   Past Surgical History: Last updated: 08/22/2008 Coronary artery bypass graft June '07 LIMA-LAD, SVX-Cx Hysterectomy needle bx thyroid Appendectomy thyroid right lobectomy  Family History: Last updated: 08/22/2008 Family History Hypertension Family History of Breast Cancer: Neice No FH of Colon Cancer: Family History of Prostate Cancer:  Brother Family History of Colon Polyps: Aunt Family History of Diabetes: Grandmother, Aunt, Uncle Family History of Heart Disease: Mother Family History of Kidney Disease: Sister??  Social History: Last updated: 08/22/2008 Retired Never Smoked Alcohol Use - no Illicit Drug Use - no Patient gets regular exercise.  Risk Factors: Alcohol Use: 0 (01/02/2010) Exercise: yes (08/22/2008)  Risk Factors: Smoking Status: never (01/02/2010)  Family History: Reviewed history from 08/22/2008 and no changes required. Family History Hypertension Family History of Breast Cancer: Neice No FH of Colon Cancer: Family History of Prostate Cancer:  Brother Family History  of Colon Polyps: Aunt Family History of Diabetes: Grandmother, Aunt, Uncle Family History of Heart Disease: Mother Family History of Kidney Disease: Sister??  Social History: Reviewed history from 08/22/2008 and no changes required. Retired Never Smoked Alcohol Use - no Illicit Drug Use - no Patient gets regular exercise. HIV Risk:  no risk noted Hepatitis Risk:  no risk noted STD Risk:  no risk noted  Review of Systems  The patient denies anorexia, fever, weight loss, weight gain, chest pain, syncope, dyspnea on exertion, peripheral edema, prolonged cough, abdominal pain, hematuria, suspicious skin lesions, and angioedema.   MS:  Complains of low back pain; denies loss of strength and  muscle aches. Neuro:  Denies brief paralysis, disturbances in coordination, numbness, tingling, and weakness.  Physical Exam  General:  alert, well-developed, well-nourished, well-hydrated, appropriate dress, normal appearance, healthy-appearing, and cooperative to examination.   Head:  normocephalic, atraumatic, no abnormalities observed, and no abnormalities palpated.   Neck:  supple, full ROM, no masses, no thyromegaly, no thyroid nodules or tenderness, no JVD, normal carotid upstroke, no carotid bruits, and no cervical lymphadenopathy.   Lungs:  normal respiratory effort, no intercostal retractions, no accessory muscle use, normal breath sounds, no dullness, no fremitus, no crackles, and no wheezes.   Heart:  normal rate, regular rhythm, no murmur, no gallop, no rub, and no JVD.   Abdomen:  soft, non-tender, normal bowel sounds, no distention, no masses, no guarding, no rigidity, no rebound tenderness, no abdominal hernia, no inguinal hernia, no hepatomegaly, and no splenomegaly.   Msk:  normal ROM, no joint tenderness, no joint swelling, no joint warmth, no redness over joints, no joint deformities, no joint instability, and no crepitation.     Detailed Back/Spine Exam  General:     Well-developed, well-nourished, in no acute distress; alert and oriented x 3.    Gait:    Normal heel-toe gait pattern bilaterally.    Lumbosacral Exam:  Inspection-deformity:    Normal Palpation-spinal tenderness:  Normal Range of Motion:    Forward Flexion:   10 degrees    Hyperextension:   30 degrees    Right Lateral Bend:   35 degrees    Left Lateral Bend:   35 degrees Lying Straight Leg Raise:    Right:  negative    Left:  negative Sitting Straight Leg Raise:    Right:  negative    Left:  negative Reverse Straight Leg Raise:    Right:  negative    Left:  negative Contralateral Straight Leg Raise:    Right:  negative    Left:  negative Sciatic Notch:    There is no sciatic notch tenderness. Toe Walking:    Right:  normal    Left:  normal Heel Walking:    Right:  normal    Left:  normal   Impression & Recommendations:  Problem # 1:  HERNIATED DISC (ICD-722.2) Assessment New try depo-medrol IM to reduce inflammation  Problem # 2:  LOW BACK PAIN, ACUTE (ICD-724.2) Assessment: New will check xrays to look for Vertebral fracture, DDD, lytic lesion, etc. Her updated medication list for this problem includes:    Aspirin 81 Mg Tabs (Aspirin) .Marland Kitchen... Take one tablet daily    Etodolac 400 Mg Tabs (Etodolac) ..... One by mouth two times a day with food as needed for low back pain    Hydrocodone-acetaminophen 7.5-500 Mg Tabs (Hydrocodone-acetaminophen) ..... One by mouth three times a day as needed for low back pain  Orders: T-Lumbar Spine Complete, 5 Views [id number]) Depo- Medrol 80mg  (J1040) Admin of Therapeutic Inj  intramuscular or subcutaneous (13086)  Complete Medication List: 1)  Benicar 40 Mg Tabs (Olmesartan medoxomil) .... Take 1 tablet by mouth once a day 2)  Synthroid 75 Mcg Tabs (Levothyroxine sodium) .... Take one tablet by mouth once daily. 3)  Aspirin 81 Mg Tabs (Aspirin) .... Take one tablet daily 4)  Carafate 1 Gm Tabs (Sucralfate) .... Take as  needed 5)  Neuro B-12 Forte S 1000 Mcg/ml Inj Soln (Cyanocobalamin) .... Take monthly 6)  Coreg 12.5 Mg Tabs (Carvedilol) .... 1/2 tab  by mouth two times a day 7)  Ayr Saline Nasal  Drops 0.65 % Soln (Saline) .... As needed 8)  Furosemide 40 Mg Tabs (Furosemide) .Marland Kitchen.. 1 by mouth  once daily 9)  Actos 30 Mg Tabs (Pioglitazone hcl) .... Take 1 tablet by mouth once a day 10)  Lipitor 80 Mg Tabs (Atorvastatin calcium) .... Once daily 11)  Florastor 250 Mg Caps (Saccharomyces boulardii) .... Take one by mouth once daily 12)  Norvasc 10 Mg Tabs (Amlodipine besylate) .... Take 1 tablet by mouth once a day 13)  Calcium  .Marland KitchenMarland Kitchen. 1 by mouth daily 14)  Icaps Caps (Multiple vitamins-minerals) .... Take 1 capsule by mouth once a day. 15)  Etodolac 400 Mg Tabs (Etodolac) .... One by mouth two times a day with food as needed for low back pain 16)  Hydrocodone-acetaminophen 7.5-500 Mg Tabs (Hydrocodone-acetaminophen) .... One by mouth three times a day as needed for low back pain  Patient Instructions: 1)  Please schedule a follow-up appointment in 2 weeks. 2)  Most patients (90%) with low back pain will improve with time (2-6 weeks). Keep active but avoid activities that are painful. Apply moist heat and/or ice to lower back several times a day. Prescriptions: HYDROCODONE-ACETAMINOPHEN 7.5-500 MG TABS (HYDROCODONE-ACETAMINOPHEN) One by mouth three times a day as needed for low back pain  #35 x 0   Entered and Authorized by:   Etta Grandchild MD   Signed by:   Etta Grandchild MD on 01/02/2010   Method used:   Print then Give to Patient   RxID:   [id number] ETODOLAC 400 MG TABS (ETODOLAC) One by mouth two times a day with food as needed for low back pain  #25 x 0   Entered and Authorized by:   Etta Grandchild MD   Signed by:   Etta Grandchild MD on 01/02/2010   Method used:   Print then Give to Patient   RxID:   [id number]   Current Allergies (reviewed today): ! SULFA ! CODEINE   Medication  Administration  Injection # 1:    Medication: Depo- Medrol 80mg     Diagnosis: LOW BACK PAIN, ACUTE (ICD-724.2)    Route: IM    Site: RUOQ gluteus    Exp Date: 08/25/2012    Lot #: [id number]    Mfr: [employer]    Patient tolerated injection without complications    Given by: Mervin Kung CMA (AAMA) (January 02, 2010 12:07 PM)  Orders Added: 1)  T-Lumbar Spine Complete, 5 Views [71110TC] 2)  Depo- Medrol 80mg  [J1040] 3)  Admin of Therapeutic Inj  intramuscular or subcutaneous [96372] 4)  Est. Patient Level IV [95188]

## 2010-05-28 NOTE — Progress Notes (Signed)
Summary: Benicar refill  Phone Note Refill Request Message from:  Fax from Pharmacy on October 19, 2009 11:38 AM  Refills Requested: Medication #1:  BENICAR 40 MG TABS Take 1 tablet by mouth once a day Patient aware rf was completed.  Initial call taken by: Lucious Groves,  October 19, 2009 11:38 AM    Prescriptions: BENICAR 40 MG TABS (OLMESARTAN MEDOXOMIL) Take 1 tablet by mouth once a day  #30 x 12   Entered by:   Lucious Groves   Authorized by:   Jacques Navy MD   Signed by:   Lucious Groves on 10/19/2009   Method used:   Faxed to ...       [facility]* (retail)       [address]       [place], [place]  [address]       Ph: [phone]       Fax: [phone]   RxID:   [id number]

## 2010-05-28 NOTE — Progress Notes (Signed)
  Phone Note Refill Request Message from:  Fax from Pharmacy on March 15, 2009 9:09 AM  Refills Requested: Medication #1:  FUROSEMIDE 40 MG  TABS 1 by mouth  once daily Initial call taken by: Ami Bullins CMA,  March 15, 2009 9:10 AM    Prescriptions: FUROSEMIDE 40 MG  TABS (FUROSEMIDE) 1 by mouth  once daily  #30 x 3   Entered by:   Ami Bullins CMA   Authorized by:   Jacques Navy MD   Signed by:   Bill Salinas CMA on 03/15/2009   Method used:   Electronically to        [facility]* (retail)       [address]       [place], [place]  [address]       Ph: [phone]       Fax: [phone]   RxID:   [id number]

## 2010-05-28 NOTE — Progress Notes (Signed)
Summary: Samples  Phone Note Call from Patient Call back at Home Phone [phone]   Caller: Patient Call For: Jacques Navy MD Summary of Call: Patient came into the office requesting samples for Benicar. She is in the donut hole. Initial call taken by: Irma Newness,  February 01, 2009 4:08 PM  Follow-up for Phone Call        samples given Follow-up by: Lamar Sprinkles, CMA,  February 01, 2009 4:17 PM

## 2010-05-28 NOTE — Letter (Signed)
   [facility] [address] [place], [place]  [address] Phone: [phone]      March 15, 2007   [name] [address] [place], [place] [address]  RE:  LAB RESULTS  Dear  [name],  The following is an interpretation of your most recent lab tests.  Please take note of any instructions provided or changes to medications that have resulted from your lab work.     CBC:  Stable - no changes needed  B12 level is normal  Please call for questions.   Sincerely Yours,    Jacques Navy MD

## 2010-05-28 NOTE — Progress Notes (Signed)
  Phone Note Other Incoming   Summary of Call: After reviewing pt's chart, pt will be due for Tetanus shot at her next appt. Pt has never had Zostavax. Her last Bone Density screening was 10/13/2001 on exam had shown mild osteoporosis. Her last mammogram was in Nov and was normal. this info. will be updated in preventive screening tab.  Initial call taken by: Ami Bullins CMA,  April 16, 2009 3:02 PM  Follow-up for Phone Call        Thanks! Please have the patient schedule a f/u DXA Follow-up by: Jacques Navy MD,  April 16, 2009 4:25 PM  Additional Follow-up for Phone Call Additional follow up Details #1::        Spoke with pt and sent her to schedulers to set up repeat DXA Additional Follow-up by: Ami Bullins CMA,  April 16, 2009 4:48 PM      Preventive Care Screening  Bone Density:    Date:  10/13/2001    Results:  abnormal

## 2010-05-28 NOTE — Miscellaneous (Signed)
Summary: Orders Update  Clinical Lists Changes  Orders: Added new Test order of TLB-CBC Platelet - w/Differential (85025-CBCD) - Signed Added new Test order of TLB-B12, Serum-Total ONLY (40981-X91) - Signed

## 2010-05-28 NOTE — Miscellaneous (Signed)
Summary: Orders Update  Clinical Lists Changes  Orders: Added new Referral order of Cardiology Referral (Cardiology) - Signed 

## 2010-05-28 NOTE — Assessment & Plan Note (Signed)
Summary: PER PT REC'D LETTER TO SCHED-GO OVER BONE DENSITY--STC   Vital Signs:  Patient profile:   77 year old female Height:      65 inches Weight:      105 pounds BMI:     17.54 O2 Sat:      98 % on Room air Temp:     97.9 degrees F oral Pulse rate:   59 / minute BP sitting:   142 / 68  (left arm) Cuff size:   regular  Vitals Entered By: Bill Salinas CMA (June 07, 2009 3:01 PM)  O2 Flow:  Room air CC: Office visit to discuss Bone Density results/ ab   Primary Care Provider:  Illene Regulus, MD  CC:  Office visit to discuss Bone Density results/ ab.  History of Present Illness: Patient had a DXA scan with osteoporotic readings for both hips: -2.6 left , -2.8 right. she is here to discuss treatment options.  She has been found to have an abdominal bruits   Current Medications (verified): 1)  Benicar 40 Mg Tabs (Olmesartan Medoxomil) .... Take 1 Tablet By Mouth Once A Day 2)  Synthroid 75 Mcg Tabs (Levothyroxine Sodium) .... Take One Tablet By Mouth Once Daily. 3)  Aspirin 81 Mg  Tabs (Aspirin) .... Take One Tablet Daily 4)  Carafate 1 Gm  Tabs (Sucralfate) .... Take As Needed 5)  Neuro B-12 Forte S 1000 Mcg/ml Inj Soln (Cyanocobalamin) .... Take Monthly 6)  Coreg 12.5 Mg Tabs (Carvedilol) .Marland Kitchen.. 1 By Mouth Two Times A Day 7)  Ayr Saline Nasal Drops 0.65 %  Soln (Saline) .... As Needed 8)  Furosemide 40 Mg  Tabs (Furosemide) .Marland Kitchen.. 1 By Mouth  Once Daily 9)  Actos 30 Mg  Tabs (Pioglitazone Hcl) .... Take 1 Tablet By Mouth Once A Day 10)  Lipitor 80 Mg  Tabs (Atorvastatin Calcium) .... Once Daily 11)  Florastor 250 Mg Caps (Saccharomyces Boulardii) .... Take One By Mouth Once Daily 12)  Norvasc 10 Mg Tabs (Amlodipine Besylate) .... Take 1 Tablet By Mouth Once A Day 13)  Calcium .Marland Kitchen.. 1 By Mouth Daily  Allergies (verified): 1)  ! Sulfa 2)  ! Codeine PMH-FH-SH reviewed-no changes except otherwise noted  Physical Exam  General:  Well-developed,well-nourished,in no acute  distress; alert,appropriate and cooperative throughout examination Eyes:  vision grossly intact, corneas and lenses clear, and no injection.   Lungs:  normal respiratory effort and normal breath sounds.   Heart:  normal rate and regular rhythm.   Msk:  normal ROM and no joint tenderness.   Pulses:  2+ radial Neurologic:  alert & oriented X3, cranial nerves II-XII intact, and gait normal.   Skin:  turgor normal and color normal.   Psych:  Oriented X3, normally interactive, and good eye contact.     Impression & Recommendations:  Problem # 1:  OSTEOPOROSIS (ICD-733.00) Reviewed DXA scan. Discussed treatment options.  Plan - patient is a good candidate for reclast infusion treatment and she concurs.   Orders: TLB-Calcium (82310-CA) TLB-Creatinine, Blood (82565-CREA)  (greater than 80% of visit spent on education and counseling in regard to osteoporosis and treatment.) Total face to face time 20 min.  Complete Medication List: 1)  Benicar 40 Mg Tabs (Olmesartan medoxomil) .... Take 1 tablet by mouth once a day 2)  Synthroid 75 Mcg Tabs (Levothyroxine sodium) .... Take one tablet by mouth once daily. 3)  Aspirin 81 Mg Tabs (Aspirin) .... Take one tablet daily 4)  Carafate 1 Gm  Tabs (Sucralfate) .... Take as needed 5)  Neuro B-12 Forte S 1000 Mcg/ml Inj Soln (Cyanocobalamin) .... Take monthly 6)  Coreg 12.5 Mg Tabs (Carvedilol) .Marland Kitchen.. 1 by mouth two times a day 7)  Ayr Saline Nasal Drops 0.65 % Soln (Saline) .... As needed 8)  Furosemide 40 Mg Tabs (Furosemide) .Marland Kitchen.. 1 by mouth  once daily 9)  Actos 30 Mg Tabs (Pioglitazone hcl) .... Take 1 tablet by mouth once a day 10)  Lipitor 80 Mg Tabs (Atorvastatin calcium) .... Once daily 11)  Florastor 250 Mg Caps (Saccharomyces boulardii) .... Take one by mouth once daily 12)  Norvasc 10 Mg Tabs (Amlodipine besylate) .... Take 1 tablet by mouth once a day 13)  Calcium  .Marland KitchenMarland Kitchen. 1 by mouth daily

## 2010-05-28 NOTE — Procedures (Signed)
Summary: EGD Report/[facility]  [facility]/ [facility]   Imported By: Esmeralda Links D'jimraou 05/25/2007 13:28:57  _____________________________________________________________________  External Attachment:    Type:   Image     Comment:   External Document

## 2010-05-28 NOTE — Miscellaneous (Signed)
Summary: BONE DENSITY  Clinical Lists Changes  Orders: Added new Test order of T-Bone Densitometry (77080) - Signed Added new Test order of T-Lumbar Vertebral Assessment (77082) - Signed 

## 2010-05-28 NOTE — Assessment & Plan Note (Signed)
Summary: 6 WK F/U PER PT/#/CD   Vital Signs:  Patient profile:   77 year old female Height:      65 inches Weight:      105 pounds BMI:     17.54 O2 Sat:      98 % on Room air Temp:     98.0 degrees F oral Pulse rate:   53 / minute BP sitting:   116 / 58  (left arm) Cuff size:   regular  Vitals Entered By: Bill Salinas CMA (March 19, 2009 2:09 PM)  O2 Flow:  Room air CC: Pt here for 6 week follow up on low heart rate. Since reducing her coreg pt's states her heart rate has improved. pt has never had zostavax injection and no longer get pap smears. Pt has a bone density but doesn't remember how long its been. I will order her chart to update this info/ ab   Primary Care Provider:  Illene Regulus, MD  CC:  Pt here for 6 week follow up on low heart rate. Since reducing her coreg pt's states her heart rate has improved. pt has never had zostavax injection and no longer get pap smears. Pt has a bone density but doesn't remember how long its been. I will order her chart to update this info/ ab.  History of Present Illness: See Dr. Diamantina Monks note of October 11th. She came in with bradycardia that was symptomatic with heart rate in the 40's. All lab reviewed from that visit which was normal except for Hgb 11.8. Coreg was reduced coreg to 12.5 mg two times a day. With this change she saw a rapid improvement. she has had occasional episodes of low rate but nothing as bad as before reduction in  dose. Her BP has done well on the lower dose.  Current Medications (verified): 1)  Benicar 40 Mg Tabs (Olmesartan Medoxomil) .... Take 1 Tablet By Mouth Once A Day 2)  Synthroid 75 Mcg Tabs (Levothyroxine Sodium) .... Take One Tablet By Mouth Once Daily. 3)  Aspirin 81 Mg  Tabs (Aspirin) .... Take One Tablet Daily 4)  Carafate 1 Gm  Tabs (Sucralfate) .... Take As Needed 5)  Neuro B-12 Forte S 1000 Mcg/ml Inj Soln (Cyanocobalamin) .... Take Monthly 6)  Coreg 12.5 Mg Tabs (Carvedilol) .Marland Kitchen.. 1 By Mouth  Two Times A Day 7)  Ayr Saline Nasal Drops 0.65 %  Soln (Saline) .... As Needed 8)  Furosemide 40 Mg  Tabs (Furosemide) .Marland Kitchen.. 1 By Mouth  Once Daily 9)  Actos 30 Mg  Tabs (Pioglitazone Hcl) .... Take 1 Tablet By Mouth Once A Day 10)  Lipitor 80 Mg  Tabs (Atorvastatin Calcium) .... Once Daily 11)  Florastor 250 Mg Caps (Saccharomyces Boulardii) .... Take One By Mouth Once Daily 12)  Norvasc 10 Mg Tabs (Amlodipine Besylate) .... Take 1 Tablet By Mouth Once A Day  Allergies (verified): 1)  ! Sulfa 2)  ! Codeine  Past History:  Past Medical History: Last updated: 02/05/2009 APPENDECTOMY, HX OF (ICD-V45.79) HYSTERECTOMY, HX OF (ICD-V45.77) RENAL CALCULUS, HX OF (ICD-V13.01) PERIPHERAL VASCULAR DISEASE (ICD-443.9) ANEMIA, PERNICIOUS, HX OF (ICD-V12.3) ATROPHIC GASTRITIS (ICD-535.10) HYPOTHYROIDISM (ICD-244.9) HYPERLIPIDEMIA (ICD-272.4) HYPERTENSION (ICD-401.9) CORONARY ARTERY DISEASE (ICD-414.00) s/p cabg Weight loss Median Arcuate Ligament - follwed by Dr. Madilyn Fireman.   Past Surgical History: Last updated: 08/22/2008 Coronary artery bypass graft June '07 LIMA-LAD, SVX-Cx Hysterectomy needle bx thyroid Appendectomy thyroid right lobectomy  Family History: Last updated: 08/22/2008 Family History Hypertension Family History of Breast  Cancer: Neice No FH of Colon Cancer: Family History of Prostate Cancer:  Brother Family History of Colon Polyps: Aunt Family History of Diabetes: Grandmother, Aunt, Uncle Family History of Heart Disease: Mother Family History of Kidney Disease: Sister??  Social History: Last updated: 08/22/2008 Retired Never Smoked Alcohol Use - no Illicit Drug Use - no Patient gets regular exercise.  Review of Systems  The patient denies anorexia, fever, weight loss, weight gain, hoarseness, chest pain, syncope, peripheral edema, prolonged cough, hemoptysis, abdominal pain, hematuria, genital sores, suspicious skin lesions, transient blindness, unusual weight  change, enlarged lymph nodes, and angioedema.    Physical Exam  General:  WNWD white female who appears slightly pale but in no distress Head:  normocephalic and atraumatic.   Eyes:  corneas and lenses clear and no injection.   Neck:  supple, no masses, and no thyromegaly.   Lungs:  normal respiratory effort and normal breath sounds.   Heart:  normal rate, regular rhythm, and no JVD.  I-II/VI systolic murmur at LSB Abdomen:  soft and normal bowel sounds.   Msk:  normal ROM, no joint swelling, no joint warmth, and no joint instability.   Pulses:  2+ radial pulse Neurologic:  alert & oriented X3, cranial nerves II-XII intact, strength normal in all extremities, gait normal, and DTRs symmetrical and normal.   Skin:  turgor normal, no ulcerations, and no edema.   Cervical Nodes:  no anterior cervical adenopathy and no posterior cervical adenopathy.   Psych:  Oriented X3, memory intact for recent and remote, normally interactive, and good eye contact.     Impression & Recommendations:  Problem # 1:  BRADYCARDIA (ICD-427.89) This problem is essentially resolved by reducing dose of coreg. Her BP remains well controlled.  Plan - no further evaluation at this time.          For recurrent problems with symptomatic bradycardia she is to call.  Her updated medication list for this problem includes:    Aspirin 81 Mg Tabs (Aspirin) .Marland Kitchen... Take one tablet daily    Coreg 12.5 Mg Tabs (Carvedilol) .Marland Kitchen... 1 by mouth two times a day  Complete Medication List: 1)  Benicar 40 Mg Tabs (Olmesartan medoxomil) .... Take 1 tablet by mouth once a day 2)  Synthroid 75 Mcg Tabs (Levothyroxine sodium) .... Take one tablet by mouth once daily. 3)  Aspirin 81 Mg Tabs (Aspirin) .... Take one tablet daily 4)  Carafate 1 Gm Tabs (Sucralfate) .... Take as needed 5)  Neuro B-12 Forte S 1000 Mcg/ml Inj Soln (Cyanocobalamin) .... Take monthly 6)  Coreg 12.5 Mg Tabs (Carvedilol) .Marland Kitchen.. 1 by mouth two times a day 7)  Ayr  Saline Nasal Drops 0.65 % Soln (Saline) .... As needed 8)  Furosemide 40 Mg Tabs (Furosemide) .Marland Kitchen.. 1 by mouth  once daily 9)  Actos 30 Mg Tabs (Pioglitazone hcl) .... Take 1 tablet by mouth once a day 10)  Lipitor 80 Mg Tabs (Atorvastatin calcium) .... Once daily 11)  Florastor 250 Mg Caps (Saccharomyces boulardii) .... Take one by mouth once daily 12)  Norvasc 10 Mg Tabs (Amlodipine besylate) .... Take 1 tablet by mouth once a day   Preventive Care Screening  Mammogram:    Date:  03/02/2009    Results:  normal   Last Pneumovax:    Date:  06/27/2004    Results:  given

## 2010-05-28 NOTE — Letter (Signed)
Summary: [name]   Imported By: Esmeralda Links D'jimraou 03/29/2007 13:04:20  _____________________________________________________________________  External Attachment:    Type:   Image     Comment:   External Document

## 2010-05-28 NOTE — Consult Note (Signed)
Summary: Corrin Parker, MD  Corrin Parker, MD   Imported By: Maryln Gottron 07/27/2007 14:54:44  _____________________________________________________________________  External Attachment:    Type:   Image     Comment:   External Document

## 2010-05-28 NOTE — Assessment & Plan Note (Signed)
Summary: R LEG IS SWELLING /[employer]WS $50   Vital Signs:  Patient Profile:   77 Years Old Female Height:     62 inches Weight:      104 pounds Temp:     97.2 degrees F oral Pulse rate:   57 / minute BP sitting:   159 / 62  (left arm)  Vitals Entered By: Tora Perches (Sep 24, 2007 3:49 PM)                 PCP:  Norins  Chief Complaint:  Multiple medical problems or concerns.  History of Present Illness: C/o swelling of R leg x 1 wk, some pain too    Current Allergies (reviewed today): ! SULFA ! CODEINE ! SULFA  Past Medical History:    Reviewed history from 05/06/2007 and no changes required:       BACK PAIN, LUMBAR (ICD-724.2)       ABNORMAL GLUCOSE NEC (ICD-790.29)       APPENDECTOMY, HX OF (ICD-V45.79)       HYSTERECTOMY, HX OF (ICD-V45.77)       RENAL CALCULUS, HX OF (ICD-V13.01)       PERIPHERAL VASCULAR DISEASE (ICD-443.9)       ANEMIA, PERNICIOUS, HX OF (ICD-V12.3)       ATROPHIC GASTRITIS (ICD-535.10)       HYPOTHYROIDISM (ICD-244.9)       HYPERLIPIDEMIA (ICD-272.4)       HYPERTENSION (ICD-401.9)       CORONARY ARTERY DISEASE (ICD-414.00)         Past Surgical History:    Reviewed history from 05/06/2007 and no changes required:       Coronary artery bypass graft June '07 LIMA-LAD, SVX-Cx       Hysterectomy       needle bx thyroid   Family History:    Family History Hypertension  Social History:    Retired    Never Smoked   Risk Factors:  Tobacco use:  never    Physical Exam  General:     Well-developed,well-nourished,in no acute distress; alert,appropriate and cooperative throughout examination Mouth:     Oral mucosa and oropharynx without lesions or exudates.  Teeth in good repair. Lungs:     Normal respiratory effort, chest expands symmetrically. Lungs are clear to auscultation, no crackles or wheezes. Heart:     Normal rate and regular rhythm. S1 and S2 normal without gallop, murmur, click, rub or other extra sounds. Abdomen:  Bowel sounds positive,abdomen soft and non-tender without masses, organomegaly or hernias noted. Msk:     No deformity or scoliosis noted of thoracic or lumbar spine.  Calves NT Pulses:     R and L carotid,radial,femoral,dorsalis pedis and posterior tibial pulses are full and equal bilaterally Extremities:     trace left pedal edema and trace right pedal edema, R>L.   Skin:     Intact without suspicious lesions or rashes Psych:     Cognition and judgment appear intact. Alert and cooperative with normal attention span and concentration. No apparent delusions, illusions, hallucinations    Impression & Recommendations:  Problem # 1:  EDEMA (ICD-782.3) R>L Assessment: Deteriorated Poss. due to a combination of Actos and Diltiazem (new). She will f/u with Dr Debby Bud to discuss Rx. She can use compr stockings, leg elevation Her updated medication list for this problem includes:    Furosemide 20 Mg Tabs (Furosemide) .Marland Kitchen... 2 by mouth once daily  Orders: Cardiology Referral (Cardiology)   Problem # 2:  LEG PAIN (ICD-729.5) R Assessment: New Likely due to #1. Get Ven Doppler to r/o DVT (unlikely) Orders: Cardiology Referral (Cardiology)   Complete Medication List: 1)  Benicar 40mg   .... Take one tablet daily 2)  Synthroid 50 Mcg Tabs (Levothyroxine sodium) .... Take one tablet daily 3)  Aspirin 81 Mg Tabs (Aspirin) .... Take one tablet daily 4)  Allergy Shots  .... Take weekly 5)  Carafate 1 Gm Tabs (Sucralfate) .... Take as needed 6)  Neuro B-12 Forte S 1000 Mcg/ml Inj Soln (Cyanocobalamin) .... Take monthly 7)  Ocuvite Adult Formula Caps (Multiple vitamins-minerals) .... Take one daily 8)  Coreg 25 Mg Tabs (Carvedilol) .... Take one tablet twice daily 9)  Astelin 137 Mcg/spray Soln (Azelastine hcl) .... As needed 10)  Ayr Saline Nasal Drops 0.65 % Soln (Saline) .... As needed 11)  Furosemide 20 Mg Tabs (Furosemide) .... 2 by mouth once daily 12)  Actos 30 Mg Tabs (Pioglitazone  hcl) .... Take 1 tablet by mouth once a day 13)  Cartia Xt 180 Mg Cp24 (Diltiazem hcl coated beads) .Marland Kitchen.. 1 by mouth once daily 14)  Lipitor 80 Mg Tabs (Atorvastatin calcium) .... Once daily   Patient Instructions: 1)  Please schedule a follow-up appointment in 1 month Dr Debby Bud.   ]

## 2010-05-28 NOTE — Assessment & Plan Note (Signed)
Summary: 9 MONTH ROV/SL  Medications Added SYNTHROID 75 MCG TABS (LEVOTHYROXINE SODIUM) Take one tablet by mouth once daily. NORVASC 10 MG TABS (AMLODIPINE BESYLATE) Take 1 tablet by mouth once a day      Allergies Added:   Referring Provider:  Oliver Barre, MD Primary Provider:  Illene Regulus, MD   History of Present Illness: [name] is 77 year old woman with a history of hypertension as well as coronary artery disease, status post two-vessel bypass in May 2007.  She also has hyperlipidemia, hypothyroidism, moderate carotid artery stenosis and weight loss. She returns for routine f/u.    From a cardiac standpoint doing well. Walking regularly. Denies CP or shortness of breath. Still struggling with weight loss but seems to have leveled off some. Having occasional PVCs at night and is aware of heartbeat. No orthopnea, PND, syncope.  Occasional ab pain.   Has been following with Dr. Madilyn Valdez for median arcuate ligament syndrome who feels that this is not responsible for her weight loss (I agree).     Current Medications (verified): 1)  Benicar 40 Mg Tabs (Olmesartan Medoxomil) .... Take 1 Tablet By Mouth Once A Day 2)  Synthroid 75 Mcg Tabs (Levothyroxine Sodium) .... Take One Tablet By Mouth Once Daily. 3)  Aspirin 81 Mg  Tabs (Aspirin) .... Take One Tablet Daily 4)  Carafate 1 Gm  Tabs (Sucralfate) .... Take As Needed 5)  Neuro B-12 Forte S 1000 Mcg/ml Inj Soln (Cyanocobalamin) .... Take Monthly 6)  Coreg 25 Mg  Tabs (Carvedilol) .... Take One Tablet Twice Daily 7)  Ayr Saline Nasal Drops 0.65 %  Soln (Saline) .... As Needed 8)  Furosemide 40 Mg  Tabs (Furosemide) .Marland Kitchen.. 1 By Mouth  Once Daily 9)  Actos 30 Mg  Tabs (Pioglitazone Hcl) .... Take 1 Tablet By Mouth Once A Day 10)  Lipitor 80 Mg  Tabs (Atorvastatin Calcium) .... Once Daily 11)  Florastor 250 Mg Caps (Saccharomyces Boulardii) .... Take One By Mouth Once Daily 12)  Norvasc 10 Mg Tabs (Amlodipine Besylate) .... Take 1  Tablet By Mouth Once A Day  Allergies (verified): 1)  ! Sulfa 2)  ! Codeine  Past History:  Past Medical History: BACK PAIN, LUMBAR (ICD-724.2) ABNORMAL GLUCOSE NEC (ICD-790.29) APPENDECTOMY, HX OF (ICD-V45.79) HYSTERECTOMY, HX OF (ICD-V45.77) RENAL CALCULUS, HX OF (ICD-V13.01) PERIPHERAL VASCULAR DISEASE (ICD-443.9) ANEMIA, PERNICIOUS, HX OF (ICD-V12.3) ATROPHIC GASTRITIS (ICD-535.10) HYPOTHYROIDISM (ICD-244.9) HYPERLIPIDEMIA (ICD-272.4) HYPERTENSION (ICD-401.9) CORONARY ARTERY DISEASE (ICD-414.00) s/p cabg Weight loss Median Arcuate Ligament - follwed by Dr. Madilyn Valdez.   Review of Systems       As per HPI and past medical history; otherwise all systems negative.   Vital Signs:  Patient profile:   77 year old female Height:      65 inches Weight:      102 pounds BMI:     17.04 Pulse rate:   57 / minute BP sitting:   118 / 64  (left arm)  Vitals Entered By: Laurance Flatten CMA (Sep 20, 2008 12:30 PM)  Physical Exam  General:  Very thin: well appearing. no resp difficulty HEENT: normal Neck: supple. no JVD. Carotids 2+ bilat; not bruits. No lymphadenopathy or thryomegaly appreciated. Cor: PMI nondisplaced. Regular rate & rhythm. No rubs, gallops, murmur. Lungs: clear Abdomen: soft, nontender, nondistended. No hepatosplenomegaly. + soft bruit.  No masses. Good bowel sounds. Extremities: no cyanosis, clubbing, rash, edema Neuro: alert & orientedx3, cranial nerves grossly intact. moves all 4 extremities w/o difficulty. affect pleasant  Impression & Recommendations:  Problem # 1:  CORONARY ARTERY DISEASE (ICD-414.00) Stable. No evidence of ischemia. Continue current regimen.  Problem # 2:  HYPERTENSION (ICD-401.9) Well controlled. Continue current rx.  Problem # 3:  HYPERLIPIDEMIA (ICD-272.4) Goal LDL <70.  Continue lipitor. Followed by Dr. Arthur Valdez.  Other Orders: EKG w/ Interpretation (93000)  Patient Instructions: 1)  Your physician recommends that you  schedule a follow-up appointment in: 12 MONTHS

## 2010-05-28 NOTE — Progress Notes (Signed)
Summary: SAMPLES  Phone Note Call from Patient Call back at Home Phone [phone]   Caller: Patient Call For: Jacques Navy MD Summary of Call: Pt requesting samples of Lipitor, please let pt know. Pt will be in [place] between 2 and 4pm this afternoon if she can pick up. Initial call taken by: Verdell Face,  February 19, 2009 10:21 AM  Follow-up for Phone Call        Lipitor 80 x 3 wks provided, Pt informed  Follow-up by: Lamar Sprinkles, CMA,  February 20, 2009 8:56 AM

## 2010-05-28 NOTE — Assessment & Plan Note (Signed)
Summary: gastritis flare-up/men/cd   Vital Signs:  Patient profile:   77 year old female Height:      64 inches Weight:      105.25 pounds BMI:     18.13 O2 Sat:      98 % Temp:     97.3 degrees F oral Pulse rate:   60 / minute BP sitting:   138 / 54  (right arm) Cuff size:   regular  Vitals Entered By: Windell Norfolk (August 18, 2008 1:29 PM)  CC: gastritis flare   Primary Care Provider:  Norins  CC:  gastritis flare.  History of Present Illness: hx of atrophic gastritis; now with symptoms of pain to the epigastric area; also had some diarrha /loose stool only last wk now resolved except for cramping as well; overall this episode seemed to start last wk  (about 7 days); character is dull , off and on, radiates across the whole upper abd area, has ongoing back pain so not sure it radiates to the back; lots of nausea and near vomiting; no dysphagia but some increased eructation today; does not think she has blood in the stool although one day 3 wks ago there was episode of pink smear on the paper; wt is up 1 lb from 6/09; no fever  or unusual chills; did not happen to try antacids; still taking the carafate  consistently recently although when she is feeling well she does stop it; it does seem to help; last EGD approx 04/2007 - neg except for the atrohpic gastriits; had last colonoscopy approx 4 yrs per pt - normal per pt - with recommendation at 7 yrs she thinks (no record on the EMR, and paper chart not available); had CT angio abdomen with renal stones noted, as well as critical stenosis of the celiac artery - saw dr dayes who thinks she more likely has a ligament that presses on the celiac artery and though she did not need surgury and not likely to account for any wt loss;   Problems Prior to Update: 1)  Hematochezia  (ICD-578.1) 2)  Abdominal Tenderness Other Specified Site  (ICD-789.69) 3)  Abdominal Pain, Upper  (ICD-789.09) 4)  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) 5)   Leg Pain  (ICD-729.5) 6)  Edema  (ICD-782.3) 7)  Weight Loss, Abnormal  (ICD-783.21) 8)  Back Pain, Lumbar  (ICD-724.2) 9)  Abnormal Glucose Nec  (ICD-790.29) 10)  Appendectomy, Hx of  (ICD-V45.79) 11)  Hysterectomy, Hx of  (ICD-V45.77) 12)  Renal Calculus, Hx of  (ICD-V13.01) 13)  Peripheral Vascular Disease  (ICD-443.9) 14)  Anemia, Pernicious, Hx of  (ICD-V12.3) 15)  Atrophic Gastritis  (ICD-535.10) 16)  Hypothyroidism  (ICD-244.9) 17)  Hyperlipidemia  (ICD-272.4) 18)  Hypertension  (ICD-401.9) 19)  Coronary Artery Disease  (ICD-414.00)  Medications Prior to Update: 1)  Benicar 40 Mg Tabs (Olmesartan Medoxomil) .... Take 1 Tablet By Mouth Once A Day 2)  Synthroid 50 Mcg  Tabs (Levothyroxine Sodium) .... Take One Tablet Daily 3)  Aspirin 81 Mg  Tabs (Aspirin) .... Take One Tablet Daily 4)  Allergy Shots .... Take Weekly 5)  Carafate 1 Gm  Tabs (Sucralfate) .... Take As Needed 6)  Neuro B-12 Forte S 1000 Mcg/ml Inj Soln (Cyanocobalamin) .... Take Monthly 7)  Ocuvite Adult Formula   Caps (Multiple Vitamins-Minerals) .... Take One Daily 8)  Coreg 25 Mg  Tabs (Carvedilol) .... Take One Tablet Twice Daily 9)  Astelin 137 Mcg/spray  Soln (Azelastine Hcl) .Marland KitchenMarland KitchenMarland Kitchen  As Needed 10)  Ayr Saline Nasal Drops 0.65 %  Soln (Saline) .... As Needed 11)  Furosemide 40 Mg  Tabs (Furosemide) .Marland Kitchen.. 1 By Mouth  Once Daily 12)  Actos 30 Mg  Tabs (Pioglitazone Hcl) .... Take 1 Tablet By Mouth Once A Day 13)  Dilt-Xr 240 Mg  Cp24 (Diltiazem Hcl) .Marland Kitchen.. 1 By Mouth Once Daily 14)  Lipitor 80 Mg  Tabs (Atorvastatin Calcium) .... Once Daily  Current Medications (verified): 1)  Benicar 40 Mg Tabs (Olmesartan Medoxomil) .... Take 1 Tablet By Mouth Once A Day 2)  Synthroid 50 Mcg  Tabs (Levothyroxine Sodium) .... Take One Tablet Daily 3)  Aspirin 81 Mg  Tabs (Aspirin) .... Take One Tablet Daily 4)  Allergy Shots .... Take Weekly 5)  Carafate 1 Gm  Tabs (Sucralfate) .... Take As Needed 6)  Neuro B-12 Forte S 1000  Mcg/ml Inj Soln (Cyanocobalamin) .... Take Monthly 7)  Ocuvite Adult Formula   Caps (Multiple Vitamins-Minerals) .... Take One Daily 8)  Coreg 25 Mg  Tabs (Carvedilol) .... Take One Tablet Twice Daily 9)  Astelin 137 Mcg/spray  Soln (Azelastine Hcl) .... As Needed 10)  Ayr Saline Nasal Drops 0.65 %  Soln (Saline) .... As Needed 11)  Furosemide 40 Mg  Tabs (Furosemide) .Marland Kitchen.. 1 By Mouth  Once Daily 12)  Actos 30 Mg  Tabs (Pioglitazone Hcl) .... Take 1 Tablet By Mouth Once A Day 13)  Dilt-Xr 240 Mg  Cp24 (Diltiazem Hcl) .Marland Kitchen.. 1 By Mouth Once Daily 14)  Lipitor 80 Mg  Tabs (Atorvastatin Calcium) .... Once Daily  Allergies (verified): 1)  ! Sulfa 2)  ! Codeine 3)  ! Sulfa  Past History:  Past Medical History:    BACK PAIN, LUMBAR (ICD-724.2)    ABNORMAL GLUCOSE NEC (ICD-790.29)    APPENDECTOMY, HX OF (ICD-V45.79)    HYSTERECTOMY, HX OF (ICD-V45.77)    RENAL CALCULUS, HX OF (ICD-V13.01)    PERIPHERAL VASCULAR DISEASE (ICD-443.9)    ANEMIA, PERNICIOUS, HX OF (ICD-V12.3)    ATROPHIC GASTRITIS (ICD-535.10)    HYPOTHYROIDISM (ICD-244.9)    HYPERLIPIDEMIA (ICD-272.4)    HYPERTENSION (ICD-401.9)    CORONARY ARTERY DISEASE (ICD-414.00)     (05/06/2007)  Past Surgical History:    Coronary artery bypass graft June '07 LIMA-LAD, SVX-Cx    Hysterectomy    needle bx thyroid     (05/06/2007)  Social History:    Retired    Never Smoked     (09/24/2007)  Risk Factors:    Alcohol Use: N/A    >5 drinks/d w/in last 3 months: N/A    Caffeine Use: N/A    Diet: N/A    Exercise: N/A  Risk Factors:    Smoking Status: never (09/24/2007)    Packs/Day: N/A    Cigars/wk: N/A    Pipe Use/wk: N/A    Cans of tobacco/wk: N/A    Passive Smoke Exposure: N/A  Social History:    Reviewed history from 09/24/2007 and no changes required:       Retired       Never Smoked  Review of Systems       all otherwise negative - no specific GU symptoms such as dysuria, freq or urgency  Physical Exam   General:  alert and underweight appearing.   Head:  normocephalic and atraumatic.   Eyes:  vision grossly intact, pupils equal, and pupils round.   Ears:  R ear normal and L ear normal.   Nose:  no external  deformity and no nasal discharge.   Mouth:  no gingival abnormalities and pharynx pink and moist.   Neck:  supple and no masses.   Lungs:  normal respiratory effort and normal breath sounds.   Heart:  normal rate, regular rhythm, and no gallop.   Abdomen:  soft, non-tender, and normal bowel sounds.  except for mild upper med abd , as well as lower mid suprapubic area without guarding, or rebound Msk:  no flank tenderness Extremities:  no edema, no ulcers    Impression & Recommendations:  Problem # 1:  ABDOMINAL PAIN, UPPER (ICD-789.09)  Her updated medication list for this problem includes:    Aspirin 81 Mg Tabs (Aspirin) .Marland Kitchen... Take one tablet daily  Orders: TLB-H. Pylori Abs(Helicobacter Pylori) (86677-HELICO) TLB-BMP (Basic Metabolic Panel-BMET) (80048-METABOL) TLB-CBC Platelet - w/Differential (85025-CBCD) TLB-Hepatic/Liver Function Pnl (80076-HEPATIC) TLB-Lipase (83690-LIPASE) Misc. Referral (Misc. Ref) Gastroenterology Referral (GI)  unclear etiology - to check abd u/s, h pylori and routine labs, cont carafate, f/u with GI, consider trial off lipitor  Problem # 2:  ABDOMINAL TENDERNESS OTHER SPECIFIED SITE [phone])  lower suprapubic area - diff includes UTI, proctitis,  - to check urine studies  Orders: TLB-Udip w/ Micro (81001-URINE) T-Culture, Urine (54098-11914) Gastroenterology Referral (GI)  Problem # 3:  HEMATOCHEZIA (ICD-578.1)  minimal by hx and not clear if related to chief complaint - will refer back to dr patterson/GI  Orders: Gastroenterology Referral (GI)  Complete Medication List: 1)  Benicar 40 Mg Tabs (Olmesartan medoxomil) .... Take 1 tablet by mouth once a day 2)  Synthroid 50 Mcg Tabs (Levothyroxine sodium) .... Take one tablet daily  3)  Aspirin 81 Mg Tabs (Aspirin) .... Take one tablet daily 4)  Allergy Shots  .... Take weekly 5)  Carafate 1 Gm Tabs (Sucralfate) .... Take as needed 6)  Neuro B-12 Forte S 1000 Mcg/ml Inj Soln (Cyanocobalamin) .... Take monthly 7)  Ocuvite Adult Formula Caps (Multiple vitamins-minerals) .... Take one daily 8)  Coreg 25 Mg Tabs (Carvedilol) .... Take one tablet twice daily 9)  Astelin 137 Mcg/spray Soln (Azelastine hcl) .... As needed 10)  Ayr Saline Nasal Drops 0.65 % Soln (Saline) .... As needed 11)  Furosemide 40 Mg Tabs (Furosemide) .Marland Kitchen.. 1 by mouth  once daily 12)  Actos 30 Mg Tabs (Pioglitazone hcl) .... Take 1 tablet by mouth once a day 13)  Dilt-xr 240 Mg Cp24 (Diltiazem hcl) .Marland Kitchen.. 1 by mouth once daily 14)  Lipitor 80 Mg Tabs (Atorvastatin calcium) .... Once daily  Other Orders: TLB-TSH (Thyroid Stimulating Hormone) (84443-TSH) TLB-A1C / Hgb A1C (Glycohemoglobin) (83036-A1C)  Patient Instructions: 1)  Continue all medications that you may have been taking previously 2)  Please go to the Lab in the basement for your blood and urine tests today  3)  You will be contacted about the referral(s) to: ultrasound, and GI - dr Jarold Motto 4)  Please schedule an appointment with your primary doctor as needed

## 2010-05-28 NOTE — Letter (Signed)
   [facility] [address] [place], [place]  [address] Phone: [phone]      May 28, 2009   [facility] [address] [place], [place] [address]  RE:  LAB RESULTS  Dear  Ms. SAFRAN,  The following is an interpretation of your most recent lab tests.  Please take note of any instructions provided or changes to medications that have resulted from your lab work.     Bone density study reveaals osteopenia at the spine and osteoporosis at the hips. The 10 year risk for any fracture is 26.9% (avg 17.6%), for hip fracture 12.5% (avg 5.0%)   Please schedule an appointment to discuss treatment options.   Sincerely Yours,    Jacques Navy MD

## 2010-05-28 NOTE — Progress Notes (Signed)
----   Converted from flag ---- ---- 01/08/2010 6:35 PM, Jacques Navy MD wrote: add pt to tomorrow, weds, at 11:15AM ------------------------------ 11:15A appt added to 03/11/10 appt sched w/Dr Norins

## 2010-05-28 NOTE — Assessment & Plan Note (Signed)
Summary: YEARLY F/U /CY   Visit Type:  Follow-up Referring Provider:  Oliver Barre, MD Primary Provider:  Illene Regulus, MD  CC:  NO complaints.  History of Present Illness: Ms. Loving is 77 year old woman with a history of hypertension as well as coronary artery disease, status post two-vessel bypass in May 2007.  She also has hyperlipidemia, hypothyroidism, moderate carotid artery stenosis and weight loss.   Previously followed with Dr. Madilyn Fireman for median arcuate ligament syndrome who feels that this is not responsible for her weight loss (I agree).    She returns for routine f/u.    From a cardiac standpoint doing fairly well. Walking regularly and gardening. Howeve continues to feel fatigued. HR in the 40-50s range. Wr previously cut coreg back from 25 bid to 12.5 bid. No syncope or presyncope.  Denies CP or shortness of breath. Weight loss has leveled off. +indigestion.   Bloodwork from 08/14/09: TC 185 TG 73 HDL 106 LDL 80  Current Medications (verified): 1)  Benicar 40 Mg Tabs (Olmesartan Medoxomil) .... Take 1 Tablet By Mouth Once A Day 2)  Synthroid 75 Mcg Tabs (Levothyroxine Sodium) .... Take One Tablet By Mouth Once Daily. 3)  Aspirin 81 Mg  Tabs (Aspirin) .... Take One Tablet Daily 4)  Carafate 1 Gm  Tabs (Sucralfate) .... Take As Needed 5)  Neuro B-12 Forte S 1000 Mcg/ml Inj Soln (Cyanocobalamin) .... Take Monthly 6)  Coreg 12.5 Mg Tabs (Carvedilol) .Marland Kitchen.. 1 By Mouth Two Times A Day 7)  Ayr Saline Nasal Drops 0.65 %  Soln (Saline) .... As Needed 8)  Furosemide 40 Mg  Tabs (Furosemide) .Marland Kitchen.. 1 By Mouth  Once Daily 9)  Actos 30 Mg  Tabs (Pioglitazone Hcl) .... Take 1 Tablet By Mouth Once A Day 10)  Lipitor 80 Mg  Tabs (Atorvastatin Calcium) .... Once Daily 11)  Florastor 250 Mg Caps (Saccharomyces Boulardii) .... Take One By Mouth Once Daily 12)  Norvasc 10 Mg Tabs (Amlodipine Besylate) .... Take 1 Tablet By Mouth Once A Day 13)  Calcium .Marland Kitchen.. 1 By Mouth Daily  Allergies  (verified): 1)  ! Sulfa 2)  ! Codeine  Past History:  Past Medical History: APPENDECTOMY, HX OF (ICD-V45.79) HYSTERECTOMY, HX OF (ICD-V45.77) RENAL CALCULUS, HX OF (ICD-V13.01) PERIPHERAL VASCULAR DISEASE (ICD-443.9) ANEMIA, PERNICIOUS, HX OF (ICD-V12.3) ATROPHIC GASTRITIS (ICD-535.10) HYPOTHYROIDISM (ICD-244.9) HYPERLIPIDEMIA (ICD-272.4) HYPERTENSION (ICD-401.9) CORONARY ARTERY DISEASE (ICD-414.00) s/p cabg Carotid artery stenosis    --u/s 9/10: 40-59% Bilaterally. stable.  Weight loss Median Arcuate Ligament - follwed by Dr. Madilyn Fireman.   Review of Systems       As per HPI and past medical history; otherwise all systems negative.   Vital Signs:  Patient profile:   77 year old female Height:      65 inches Weight:      108 pounds BMI:     18.04 Pulse rate:   54 / minute Resp:     16 per minute BP sitting:   132 / 60  (right arm)  Vitals Entered By: Marrion Coy, CNA (Aug 27, 2009 2:23 PM) CC: NO complaints   Physical Exam  General:  Thin. a bit fatigued appearing. no resp difficulty HEENT: normal Neck: supple. no JVD. Carotids 2+ bilat; + bilat radiated bruits. No lymphadenopathy or thryomegaly appreciated. Cor: PMI nondisplaced. Bradycardic and regular.No rubs, gallops. 2/6 systolic murmur RSB. Lungs: clear Abdomen: soft, nontender, nondistended. No hepatosplenomegaly. No bruits or masses. Good bowel sounds. Extremities: no cyanosis, clubbing, rash, edema Neuro: alert &  orientedx3, cranial nerves grossly intact. moves all 4 extremities w/o difficulty. affect pleasant    Impression & Recommendations:  Problem # 1:  CORONARY ARTERY DISEASE (ICD-414.00) Stable. No evidence of ischemia. Continue current regimen.  Problem # 2:  FATIGUE / MALAISE (ICD-780.79) Likely multifactorial. We will decrease carvedilol to 6.25 two times a day and see if this helps.  Problem # 3:  CAROTID ARTERY DISEASE (ICD-433.10) Asymptomatic. Due for f/u ultrasound in  September.  Problem # 4:  HYPERTENSION (ICD-401.9) BP looks good now. May creep up as we decrease carvedilol. Can titrate other meds as needed.  Other Orders: EKG w/ Interpretation (93000) Carotid Duplex (Carotid Duplex)  Patient Instructions: 1)  Decrease Carvedilol to 6.25mg  two times a day  2)  Your physician has requested that you have a carotid duplex. This test is an ultrasound of the carotid arteries in your neck. It looks at blood flow through these arteries that supply the brain with blood. Allow one hour for this exam. There are no restrictions or special instructions.  Needs in September 3)  Follow up in 1 year

## 2010-05-28 NOTE — Assessment & Plan Note (Signed)
Summary: FLU SHOT  MEN  STC  Nurse Visit    Prior Medications: BENICAR 40 MG TABS (OLMESARTAN MEDOXOMIL) Take 1 tablet by mouth once a day SYNTHROID 50 MCG  TABS (LEVOTHYROXINE SODIUM) take one tablet daily ASPIRIN 81 MG  TABS (ASPIRIN) take one tablet daily ALLERGY SHOTS () Take weekly CARAFATE 1 GM  TABS (SUCRALFATE) take as needed NEURO B-12 FORTE S 1000 MCG/ML INJ SOLN (CYANOCOBALAMIN) take monthly OCUVITE ADULT FORMULA   CAPS (MULTIPLE VITAMINS-MINERALS) take one daily COREG 25 MG  TABS (CARVEDILOL) take one tablet twice daily ASTELIN 137 MCG/SPRAY  SOLN (AZELASTINE HCL) as needed AYR SALINE NASAL DROPS 0.65 %  SOLN (SALINE) as needed FUROSEMIDE 40 MG  TABS (FUROSEMIDE) 1 by mouth  once daily ACTOS 30 MG  TABS (PIOGLITAZONE HCL) Take 1 tablet by mouth once a day DILT-XR 240 MG  CP24 (DILTIAZEM HCL) 1 by mouth once daily LIPITOR 80 MG  TABS (ATORVASTATIN CALCIUM) once daily Current Allergies: ! SULFA ! CODEINE ! SULFA    Orders Added: 1)  Admin 1st Vaccine [90471] 2)  Flu Vaccine 26yrs + Baden.Dew    ]        Flu Vaccine Consent Questions     Do you have a history of severe allergic reactions to this vaccine? no    Any prior history of allergic reactions to egg and/or gelatin? no    Do you have a sensitivity to the preservative Thimersol? no    Do you have a past history of Guillan-Barre Syndrome? no    Do you currently have an acute febrile illness? no    Have you ever had a severe reaction to latex? no    Vaccine information given and explained to patient? yes    Are you currently pregnant? no    Lot Number:[id number]   Exp Date:10/25/2008   Site Given  right Deltoid IMu

## 2010-05-28 NOTE — Assessment & Plan Note (Signed)
Summary: BP CK/MEN/NML  PT COMING IN AT 10:15/NML  Nurse Visit   Vital Signs:  Patient Profile:   77 Years Old Female CC:      BP CHECK Height:     62 inches Pulse rate:   58 / minute BP sitting:   177 / 74  (left arm) Cuff size:   small  Vitals Entered By: Zackery Barefoot CMA (May 17, 2007 11:07 AM)                 Prior Medications: BENICAR 40MG  () take one tablet daily SIMVASTATIN 40 MG  TABS (SIMVASTATIN) Take 1 tablet by mouth once a day SYNTHROID 50 MCG  TABS (LEVOTHYROXINE SODIUM) take one tablet daily ASPIRIN 81 MG  TABS (ASPIRIN) take one tablet daily ALLERGY SHOTS () Take weekly CARAFATE 1 GM  TABS (SUCRALFATE) take as needed NEURO B-12 FORTE S 1000 MCG/ML INJ SOLN (CYANOCOBALAMIN) take monthly OCUVITE ADULT FORMULA   CAPS (MULTIPLE VITAMINS-MINERALS) take one daily COREG 25 MG  TABS (CARVEDILOL) take one tablet twice daily ASTELIN 137 MCG/SPRAY  SOLN (AZELASTINE HCL) as needed AYR SALINE NASAL DROPS 0.65 %  SOLN (SALINE) as needed FUROSEMIDE 20 MG  TABS (FUROSEMIDE) 2 by mouth once daily ACTOS 30 MG  TABS (PIOGLITAZONE HCL) Take 1 tablet by mouth once a day Current Allergies: ! SULFA ! CODEINE ! Jaylen.Erps      ]  Appended Document: Orders Update    Clinical Lists Changes  Orders: Added new Service order of Est. Patient Level I ([address]) - Signed

## 2010-05-28 NOTE — Assessment & Plan Note (Signed)
Summary: cloudy urine,pain right side/cd   Vital Signs:  Patient profile:   77 year old female Height:      65 inches Weight:      107 pounds BMI:     17.87 O2 Sat:      98 % on Room air Temp:     97.6 degrees F oral Pulse rate:   62 / minute BP sitting:   134 / 70  (left arm) Cuff size:   regular  Vitals Entered By: Bill Salinas CMA (July 30, 2009 4:51 PM)  O2 Flow:  Room air CC: cloudy urine with right side pain/ ab   Primary Care Provider:  Illene Regulus, MD  CC:  cloudy urine with right side pain/ ab.  History of Present Illness: Patient presents for a change in her urine: hazy, dark, with a metallic odor. She has not had fever or chills, no flank pain, no pain with urination, no bladder spasm. She has had increased frequency and urgency. Dip U/A negative with sp. gr. 1.005.  Current Medications (verified): 1)  Benicar 40 Mg Tabs (Olmesartan Medoxomil) .... Take 1 Tablet By Mouth Once A Day 2)  Synthroid 75 Mcg Tabs (Levothyroxine Sodium) .... Take One Tablet By Mouth Once Daily. 3)  Aspirin 81 Mg  Tabs (Aspirin) .... Take One Tablet Daily 4)  Carafate 1 Gm  Tabs (Sucralfate) .... Take As Needed 5)  Neuro B-12 Forte S 1000 Mcg/ml Inj Soln (Cyanocobalamin) .... Take Monthly 6)  Coreg 12.5 Mg Tabs (Carvedilol) .Marland Kitchen.. 1 By Mouth Two Times A Day 7)  Ayr Saline Nasal Drops 0.65 %  Soln (Saline) .... As Needed 8)  Furosemide 40 Mg  Tabs (Furosemide) .Marland Kitchen.. 1 By Mouth  Once Daily 9)  Actos 30 Mg  Tabs (Pioglitazone Hcl) .... Take 1 Tablet By Mouth Once A Day 10)  Lipitor 80 Mg  Tabs (Atorvastatin Calcium) .... Once Daily 11)  Florastor 250 Mg Caps (Saccharomyces Boulardii) .... Take One By Mouth Once Daily 12)  Norvasc 10 Mg Tabs (Amlodipine Besylate) .... Take 1 Tablet By Mouth Once A Day 13)  Calcium .Marland Kitchen.. 1 By Mouth Daily  Allergies (verified): 1)  ! Sulfa 2)  ! Codeine  Past History:  Past Medical History: Last updated: 02/05/2009 APPENDECTOMY, HX OF  (ICD-V45.79) HYSTERECTOMY, HX OF (ICD-V45.77) RENAL CALCULUS, HX OF (ICD-V13.01) PERIPHERAL VASCULAR DISEASE (ICD-443.9) ANEMIA, PERNICIOUS, HX OF (ICD-V12.3) ATROPHIC GASTRITIS (ICD-535.10) HYPOTHYROIDISM (ICD-244.9) HYPERLIPIDEMIA (ICD-272.4) HYPERTENSION (ICD-401.9) CORONARY ARTERY DISEASE (ICD-414.00) s/p cabg Weight loss Median Arcuate Ligament - follwed by Dr. Madilyn Fireman.   Past Surgical History: Last updated: 08/22/2008 Coronary artery bypass graft June '07 LIMA-LAD, SVX-Cx Hysterectomy needle bx thyroid Appendectomy thyroid right lobectomy PMH-FH-SH reviewed-no changes except otherwise noted, PSH reviewed for relevance, FH reviewed for relevance  Review of Systems  The patient denies anorexia, fever, weight loss, weight gain, decreased hearing, chest pain, peripheral edema, headaches, melena, hematuria, incontinence, muscle weakness, difficulty walking, depression, and enlarged lymph nodes.    Physical Exam  General:  Well-developed,well-nourished,in no acute distress; alert,appropriate and cooperative throughout examination Head:  Normocephalic and atraumatic without obvious abnormalities. No apparent alopecia or balding. Lungs:  Normal respiratory effort, chest expands symmetrically. Lungs are clear to auscultation, no crackles or wheezes. Heart:  Normal rate and regular rhythm. S1 and S2 normal without gallop, murmur, click, rub or other extra sounds. Abdomen:  no flank pain. No tenderness to deep palpation.   Impression & Recommendations:  Problem # 1:  UTI (ICD-599.0)  Patient  with suspicious symptoms for a bladder infection despite a negative dip U/A  Plan cipro 250mg  two times a day x 5  Her updated medication list for this problem includes:    Ciprofloxacin Hcl 250 Mg Tabs (Ciprofloxacin hcl) .Marland Kitchen... 1 by mouth two times a day x 5 for possible bladder infection  Orders: UA Dipstick w/o Micro (manual) ([address])  Complete Medication List: 1)  Benicar 40 Mg Tabs  (Olmesartan medoxomil) .... Take 1 tablet by mouth once a day 2)  Synthroid 75 Mcg Tabs (Levothyroxine sodium) .... Take one tablet by mouth once daily. 3)  Aspirin 81 Mg Tabs (Aspirin) .... Take one tablet daily 4)  Carafate 1 Gm Tabs (Sucralfate) .... Take as needed 5)  Neuro B-12 Forte S 1000 Mcg/ml Inj Soln (Cyanocobalamin) .... Take monthly 6)  Coreg 12.5 Mg Tabs (Carvedilol) .Marland Kitchen.. 1 by mouth two times a day 7)  Ayr Saline Nasal Drops 0.65 % Soln (Saline) .... As needed 8)  Furosemide 40 Mg Tabs (Furosemide) .Marland Kitchen.. 1 by mouth  once daily 9)  Actos 30 Mg Tabs (Pioglitazone hcl) .... Take 1 tablet by mouth once a day 10)  Lipitor 80 Mg Tabs (Atorvastatin calcium) .... Once daily 11)  Florastor 250 Mg Caps (Saccharomyces boulardii) .... Take one by mouth once daily 12)  Norvasc 10 Mg Tabs (Amlodipine besylate) .... Take 1 tablet by mouth once a day 13)  Calcium  .Marland KitchenMarland Kitchen. 1 by mouth daily 14)  Ciprofloxacin Hcl 250 Mg Tabs (Ciprofloxacin hcl) .Marland Kitchen.. 1 by mouth two times a day x 5 for possible bladder infection Prescriptions: CIPROFLOXACIN HCL 250 MG TABS (CIPROFLOXACIN HCL) 1 by mouth two times a day x 5 for possible bladder infection  #10 x 0   Entered and Authorized by:   Jacques Navy MD   Signed by:   Jacques Navy MD on 07/30/2009   Method used:   Electronically to        [facility] (retail)       [address]       [place], [place]  [address]       Ph: [phone]       Fax: [phone]   RxID:   [id number]   Laboratory Results   Urine Tests   Date/Time Reported: Ami Bullins CMA  July 30, 2009 4:58 PM   Routine Urinalysis   Color: yellow Appearance: Hazy Glucose: negative   (Normal Range: Negative) Bilirubin: negative   (Normal Range: Negative) Ketone: negative   (Normal Range: Negative) Spec. Gravity: <1.005   (Normal Range: 1.003-1.035) Blood: negative   (Normal Range: Negative) pH: 5.0   (Normal Range: 5.0-8.0) Protein: negative    (Normal Range: Negative) Urobilinogen: 0.2   (Normal Range: 0-1) Nitrite: negative   (Normal Range: Negative) Leukocyte Esterace: negative   (Normal Range: Negative)

## 2010-05-28 NOTE — Letter (Signed)
   [facility] [address] [place], [place]  [address] Phone: [phone]      March 15, 2007   TYYNE CLIETT [address] [place], [place] [address]  RE:  LAB RESULTS  Dear  Ms. CROY,  The following is an interpretation of your most recent lab tests.  Please take note of any instructions provided or changes to medications that have resulted from your lab work.     CBC:  Stable - no changes needed B12 was low normal  I would recommend continuing B12 replacement.  Call for questions   Sincerely Yours,    Jacques Navy MD

## 2010-05-28 NOTE — Assessment & Plan Note (Signed)
Summary: FU-STC   Vital Signs:  Patient Profile:   77 Years Old Female Height:     62 inches Weight:      102 pounds Temp:     98.8 degrees F oral Pulse rate:   58 / minute BP sitting:   163 / 72  (left arm) Cuff size:   small  Vitals Entered By: Zackery Barefoot CMA (June 21, 2007 3:54 PM)                 PCP:  Donald Memoli  Chief Complaint:  FOLLOW UP.  History of Present Illness: Called last week: Still with wighgt loss and post-prandial nausea and discomfort. Has had a very thorough GI work-up. CT-Angio- no RAS, but critical stenosis of the celiac artery.  Patient still with elevated BP.    Current Allergies: ! SULFA ! CODEINE ! SULFA        Impression & Recommendations:  Problem # 1:  WEIGHT LOSS, ABNORMAL (ICD-783.21) Patient continues to loose weight. She has had a complete GI evaluation: endoscopy, colonoscopy, Abdominal U/s, celiac panel, multiple labs including CEA 19-9, CA 125 per Dr Jarold Motto with normal results. 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 This is a possible cause of her symptoms.  Plan: refer to either Tonny Bollman @ [facility] or Liliane Bade.  Problem # 2:  HYPERTENSION (ICD-401.9)  Her updated medication list for this problem includes:    Coreg 25 Mg Tabs (Carvedilol) .Marland Kitchen... Take one tablet twice daily    Furosemide 20 Mg Tabs (Furosemide) .Marland Kitchen... 2 by mouth once daily    Cartia Xt 180 Mg Cp24 (Diltiazem hcl coated beads) .Marland Kitchen... 1 by mouth once daily  BP today: 163/72 Prior BP: 177/74 (05/17/2007)  Labs Reviewed: Creat: 0.7 (05/11/2007) Chol: 196 (06/29/2006)   HDL: 82.3 (06/29/2006)   LDL: 94 (06/29/2006)   TG: 98 (06/29/2006) CT angio with no evidence of RAS.  Plan: continue present medications. Add diltia XT 180 mg daily to regimen.   Complete Medication List: 1)  Benicar 40mg   .... Take one tablet daily 2)   Simvastatin 40 Mg Tabs (Simvastatin) .... Take 1 tablet by mouth once a day 3)  Synthroid 50 Mcg Tabs (Levothyroxine sodium) .... Take one tablet daily 4)  Aspirin 81 Mg Tabs (Aspirin) .... Take one tablet daily 5)  Allergy Shots  .... Take weekly 6)  Carafate 1 Gm Tabs (Sucralfate) .... Take as needed 7)  Neuro B-12 Forte S 1000 Mcg/ml Inj Soln (Cyanocobalamin) .... Take monthly 8)  Ocuvite Adult Formula Caps (Multiple vitamins-minerals) .... Take one daily 9)  Coreg 25 Mg Tabs (Carvedilol) .... Take one tablet twice daily 10)  Astelin 137 Mcg/spray Soln (Azelastine hcl) .... As needed 11)  Ayr Saline Nasal Drops 0.65 % Soln (Saline) .... As needed 12)  Furosemide 20 Mg Tabs (Furosemide) .... 2 by mouth once daily 13)  Actos 30 Mg Tabs (Pioglitazone hcl) .... Take 1 tablet by mouth once a day 14)  Cartia Xt 180 Mg Cp24 (Diltiazem hcl coated beads) .Marland Kitchen.. 1 by mouth once daily     Prescriptions: CARTIA XT 180 MG  CP24 (DILTIAZEM HCL COATED BEADS) 1 by mouth once daily  #30 x prn   Entered and Authorized by:   Jacques Navy MD   Signed by:   Jacques Navy MD on 06/21/2007   Method used:  Electronically sent to ...       [facility]*       [address]       [place], [place]  [address]       Ph: [phone]       Fax: [phone]   RxID:   [id number]  ]

## 2010-05-28 NOTE — Progress Notes (Signed)
  Phone Note Other Incoming   Summary of Call: patient wrote 8/21 with BPs on increased dose of diltiazem. She has better control with  reading at home of 134/49. She did report a problem with bradycardia to the 40's with symptoms. Initial call taken by: Jacques Navy MD,  December 26, 2007 2:29 PM  Follow-up for Phone Call        called patient. She is doing OK. She has not had recurrent symptomatic bradycardia and her BP has been doing well. She does have a chronic, asymptomatic bradycardia.   Will not change her meds at this time. She is encouraged to call for an appointment with Dr. Jones Broom in September. Follow-up by: Jacques Navy MD,  December 26, 2007 2:30 PM

## 2010-05-28 NOTE — Progress Notes (Signed)
  Phone Note Refill Request   Refills Requested: Medication #1:  FUROSEMIDE 40 MG  TABS 1 by mouth  once daily Initial call taken by: Ami Bullins CMA,  January 18, 2009 4:24 PM    Prescriptions: FUROSEMIDE 40 MG  TABS (FUROSEMIDE) 1 by mouth  once daily  #30 x 0   Entered by:   Ami Bullins CMA   Authorized by:   Jacques Navy MD   Signed by:   Bill Salinas CMA on 01/18/2009   Method used:   Electronically to        [facility]* (retail)       [address]       [place], [place]  [address]       Ph: [phone]       Fax: [phone]   RxID:   [id number]

## 2010-05-28 NOTE — Progress Notes (Signed)
Summary: carvediol  Phone Note Refill Request Message from:  Fax from Pharmacy on June 08, 2009 1:41 PM  Refills Requested: Medication #1:  COREG 12.5 MG TABS 1 by mouth two times a day  Method Requested: Electronic Initial call taken by: Orlan Leavens,  June 08, 2009 1:41 PM    Prescriptions: COREG 12.5 MG TABS (CARVEDILOL) 1 by mouth two times a day  #60 x 6   Entered by:   Orlan Leavens   Authorized by:   Jacques Navy MD   Signed by:   Orlan Leavens on 06/08/2009   Method used:   Electronically to        [facility]* (retail)       [address]       [place], [place]  [address]       Ph: [phone]       Fax: [phone]   RxID:   [id number]

## 2010-05-28 NOTE — Progress Notes (Signed)
Summary: Furosemide  Phone Note Refill Request   Refills Requested: Medication #1:  FUROSEMIDE 20 MG  TABS 2 by mouth once daily Initial call taken by: Lamar Sprinkles,  May 31, 2007 4:05 PM      Prescriptions: FUROSEMIDE 20 MG  TABS (FUROSEMIDE) 2 by mouth once daily  #60 x 3   Entered by:   Lamar Sprinkles   Authorized by:   Jacques Navy MD   Signed by:   Lamar Sprinkles on 05/31/2007   Method used:   Electronically sent to ...       [facility]       [address]       [place], [place]  [address]       Ph: [phone]       Fax: [phone]   RxID:   [id number]

## 2010-05-28 NOTE — Assessment & Plan Note (Signed)
Summary: low heartrate//norins out of office   Vital Signs:  Patient profile:   77 year old female Height:      65 inches (165.10 cm) Weight:      107.4 pounds (48.82 kg) O2 Sat:      97 % Temp:     98.4 degrees F (36.89 degrees C) oral Pulse rate:   57 / minute BP sitting:   152 / 42  (left arm) Cuff size:   regular  Vitals Entered By: Orlan Leavens (February 05, 2009 3:51 PM) CC: Low heart rate/ dizziness. Pt states on sat evening  (P) drop 48. Felt fine on sunday. This am (P) 50. Check BP this am 144/50 Is Patient Diabetic? No Pain Assessment Patient in pain? no        Primary Care Provider:  Illene Regulus, MD  CC:  Low heart rate/ dizziness. Pt states on sat evening  (P) drop 48. Felt fine on sunday. This am (P) 50. Check BP this am 144/50.  History of Present Illness: here today with complaint of fatigue and low heart rate onset of symptoms was 1 month ago ago. course has been gradual onset and now occurs in intermittent pattern. symptom characterized as "sinking feeling inside my body" problem associated with dizziness and light headed feeling but not associated with CP, headache or vision changes . symptoms improved by rest - sitting to recover. symptoms worsened with exertional activity. + prior hx of same symptoms "when i had my heart attck".   Clinical Review Panels:  CBC   WBC:  4.1 (08/18/2008)   RBC:  4.08 (08/18/2008)   Hgb:  12.0 (08/18/2008)   Hct:  35.4 (08/18/2008)   Platelets:  161.0 (08/18/2008)   MCV  86.6 (08/18/2008)   MCHC  33.8 (08/18/2008)   RDW  13.4 (08/18/2008)   PMN:  69.0 (08/18/2008)   Lymphs:  20.2 (08/18/2008)   Monos:  7.7 (08/18/2008)   Eosinophils:  2.1 (08/18/2008)   Basophil:  1.0 (08/18/2008)  Complete Metabolic Panel   Glucose:  97 (08/18/2008)   Sodium:  146 (08/18/2008)   Potassium:  3.4 (08/18/2008)   Chloride:  107 (08/18/2008)   CO2:  32 (08/18/2008)   BUN:  20 (08/18/2008)   Creatinine:  0.7 (08/18/2008)  Albumin:  4.1 (08/18/2008)   Total Protein:  7.1 (08/18/2008)   Calcium:  8.9 (08/18/2008)   Total Bili:  0.8 (08/18/2008)   Alk Phos:  57 (08/18/2008)   SGPT (ALT):  27 (08/18/2008)   SGOT (AST):  40 (08/18/2008)   Current Medications (verified): 1)  Benicar 40 Mg Tabs (Olmesartan Medoxomil) .... Take 1 Tablet By Mouth Once A Day 2)  Synthroid 75 Mcg Tabs (Levothyroxine Sodium) .... Take One Tablet By Mouth Once Daily. 3)  Aspirin 81 Mg  Tabs (Aspirin) .... Take One Tablet Daily 4)  Carafate 1 Gm  Tabs (Sucralfate) .... Take As Needed 5)  Neuro B-12 Forte S 1000 Mcg/ml Inj Soln (Cyanocobalamin) .... Take Monthly 6)  Coreg 25 Mg  Tabs (Carvedilol) .... Take One Tablet Twice Daily 7)  Ayr Saline Nasal Drops 0.65 %  Soln (Saline) .... As Needed 8)  Furosemide 40 Mg  Tabs (Furosemide) .Marland Kitchen.. 1 By Mouth  Once Daily 9)  Actos 30 Mg  Tabs (Pioglitazone Hcl) .... Take 1 Tablet By Mouth Once A Day 10)  Lipitor 80 Mg  Tabs (Atorvastatin Calcium) .... Once Daily 11)  Florastor 250 Mg Caps (Saccharomyces Boulardii) .... Take One By Mouth Once Daily  12)  Norvasc 10 Mg Tabs (Amlodipine Besylate) .... Take 1 Tablet By Mouth Once A Day  Allergies (verified): 1)  ! Sulfa 2)  ! Codeine  Past History:  Past Medical History: APPENDECTOMY, HX OF (ICD-V45.79) HYSTERECTOMY, HX OF (ICD-V45.77) RENAL CALCULUS, HX OF (ICD-V13.01) PERIPHERAL VASCULAR DISEASE (ICD-443.9) ANEMIA, PERNICIOUS, HX OF (ICD-V12.3) ATROPHIC GASTRITIS (ICD-535.10) HYPOTHYROIDISM (ICD-244.9) HYPERLIPIDEMIA (ICD-272.4) HYPERTENSION (ICD-401.9) CORONARY ARTERY DISEASE (ICD-414.00) s/p cabg Weight loss Median Arcuate Ligament - follwed by Dr. Madilyn Fireman.   Past Surgical History: Reviewed history from 08/22/2008 and no changes required. Coronary artery bypass graft June '07 LIMA-LAD, SVX-Cx Hysterectomy needle bx thyroid Appendectomy thyroid right lobectomy  Review of Systems       The patient complains of weight loss.  The  patient denies anorexia, fever, weight gain, syncope, peripheral edema, abdominal pain, and depression.    Physical Exam  General:  alert, well-developed, well-nourished, and cooperative to examination.    Lungs:  normal respiratory effort, no intercostal retractions or use of accessory muscles; normal breath sounds bilaterally - no crackles and no wheezes.    Heart:  normal rate, regular rhythm, no murmur, and no rub. BLE without edema.   Impression & Recommendations:  Problem # 1:  BRADYCARDIA (ICD-427.89) mild and appears chronic based on chart review of OV thru 2009 - usually in 50s however pt feels her fatigue symptoms are worse because of HR going into 40-50s will reduce Coreg by 1/2- not on other chronotropic meds - and monitor BP control EKG reviewed - no ischemic change also check for lab abnormalities that may be contrib to fatigue - see next Her updated medication list for this problem includes:    Aspirin 81 Mg Tabs (Aspirin) .Marland Kitchen... Take one tablet daily    Coreg 12.5 Mg Tabs (Carvedilol) .Marland Kitchen... 1 by mouth two times a day  Orders: TLB-BMP (Basic Metabolic Panel-BMET) (80048-METABOL) TLB-CBC Platelet - w/Differential (85025-CBCD) TLB-Hepatic/Liver Function Pnl (80076-HEPATIC) TLB-TSH (Thyroid Stimulating Hormone) (84443-TSH)  Problem # 2:  HYPERTENSION (ICD-401.9)  Her updated medication list for this problem includes:    Benicar 40 Mg Tabs (Olmesartan medoxomil) .Marland Kitchen... Take 1 tablet by mouth once a day    Coreg 12.5 Mg Tabs (Carvedilol) .Marland Kitchen... 1 by mouth two times a day    Furosemide 40 Mg Tabs (Furosemide) .Marland Kitchen... 1 by mouth  once daily    Norvasc 10 Mg Tabs (Amlodipine besylate) .Marland Kitchen... Take 1 tablet by mouth once a day  Orders: TLB-BMP (Basic Metabolic Panel-BMET) (80048-METABOL) TLB-CBC Platelet - w/Differential (85025-CBCD) TLB-Hepatic/Liver Function Pnl (80076-HEPATIC) TLB-TSH (Thyroid Stimulating Hormone) (84443-TSH)  BP today: 152/42 Prior BP: 132/60 (09/27/2008)   Labs Reviewed: K+: 3.4 (08/18/2008) Creat: : 0.7 (08/18/2008)   Chol: 196 (06/29/2006)   HDL: 82.3 (06/29/2006)   LDL: 94 (06/29/2006)   TG: 98 (06/29/2006)  Problem # 3:  HYPOTHYROIDISM (ICD-244.9)  Her updated medication list for this problem includes:    Synthroid 75 Mcg Tabs (Levothyroxine sodium) .Marland Kitchen... Take one tablet by mouth once daily.  Orders: TLB-BMP (Basic Metabolic Panel-BMET) (80048-METABOL) TLB-CBC Platelet - w/Differential (85025-CBCD) TLB-Hepatic/Liver Function Pnl (80076-HEPATIC) TLB-TSH (Thyroid Stimulating Hormone) (84443-TSH)  Labs Reviewed: TSH: 0.84 (08/18/2008)    HgBA1c: 6.0 (08/18/2008) Chol: 196 (06/29/2006)   HDL: 82.3 (06/29/2006)   LDL: 94 (06/29/2006)   TG: 98 (06/29/2006)  Problem # 4:  FATIGUE (ICD-780.79) esp with cont weight loss and progressive symptoms in last month - r/o underlying med issues with labs  Complete Medication List: 1)  Benicar 40 Mg Tabs (Olmesartan medoxomil) .Marland KitchenMarland KitchenMarland Kitchen  Take 1 tablet by mouth once a day 2)  Synthroid 75 Mcg Tabs (Levothyroxine sodium) .... Take one tablet by mouth once daily. 3)  Aspirin 81 Mg Tabs (Aspirin) .... Take one tablet daily 4)  Carafate 1 Gm Tabs (Sucralfate) .... Take as needed 5)  Neuro B-12 Forte S 1000 Mcg/ml Inj Soln (Cyanocobalamin) .... Take monthly 6)  Coreg 12.5 Mg Tabs (Carvedilol) .Marland Kitchen.. 1 by mouth two times a day 7)  Ayr Saline Nasal Drops 0.65 % Soln (Saline) .... As needed 8)  Furosemide 40 Mg Tabs (Furosemide) .Marland Kitchen.. 1 by mouth  once daily 9)  Actos 30 Mg Tabs (Pioglitazone hcl) .... Take 1 tablet by mouth once a day 10)  Lipitor 80 Mg Tabs (Atorvastatin calcium) .... Once daily 11)  Florastor 250 Mg Caps (Saccharomyces boulardii) .... Take one by mouth once daily 12)  Norvasc 10 Mg Tabs (Amlodipine besylate) .... Take 1 tablet by mouth once a day  Patient Instructions: 1)  labwork today - your results will be posted on the phone tree for review in next 24-48 hours.  2)  reduce coreg by half  to lessen effect on your heart rate - new prescription sent to your pharmacy 3)  Please schedule a follow-up appointment in 6 weeks with dr. Debby Bud. Prescriptions: COREG 12.5 MG TABS (CARVEDILOL) 1 by mouth two times a day  #60 x 2   Entered and Authorized by:   Newt Lukes MD   Signed by:   Newt Lukes MD on 02/05/2009   Method used:   Electronically to        [facility]* (retail)       [address]       [place], [place]  [address]       Ph: [phone]       Fax: [phone]   RxID:   [id number]

## 2010-05-28 NOTE — Letter (Signed)
   [facility] [address] [place], [place]  [address] Phone: [phone]      May 27, 2007   [name] [address] [place], [place] [address]  RE:  LAB RESULTS  Dear  [name],  The following is an interpretation of your most recent lab tests.  Please take note of any instructions provided or changes to medications that have resulted from your lab work.     CT angio reveals a stenosis (narrowing) of the celiac artery.   This stenosis of the celiac artery may be causing you some abdominal pain which could be worse with eating. I will send a copy of the report to Dr. Arbie Cookey and Dr. Jarold Motto for their imput.   Sincerely Yours,    Jacques Navy MD

## 2010-05-28 NOTE — Progress Notes (Signed)
  Phone Note Refill Request Message from:  Fax from Pharmacy on February 14, 2008 4:19 PM  Refills Requested: Medication #1:  BENICAR 40MG  take one tablet daily [facility] [id number] [phone]   Method Requested: Electronic Initial call taken by: Payton Spark CMA,  February 14, 2008 4:20 PM  Follow-up for Phone Call        Rx sent electronic. Follow-up by: Zackery Barefoot CMA,  February 14, 2008 6:20 PM    New/Updated Medications: BENICAR 40 MG TABS (OLMESARTAN MEDOXOMIL) Take 1 tablet by mouth once a day   Prescriptions: BENICAR 40 MG TABS (OLMESARTAN MEDOXOMIL) Take 1 tablet by mouth once a day  #30 x 5   Entered by:   Zackery Barefoot CMA   Authorized by:   Jacques Navy MD   Signed by:   Zackery Barefoot CMA on 02/14/2008   Method used:   Electronically to        [facility] (retail)       [address]       [place], [place]  [address]       Ph: [phone]       Fax: [phone]   RxID:   [id number]

## 2010-05-30 NOTE — Progress Notes (Signed)
Summary: SAMPLES   Phone Note Call from Patient Call back at Home Phone [phone]   Summary of Call: Patient is requesting samples.  Initial call taken by: Lamar Sprinkles, CMA,  April 08, 2010 9:33 AM  Follow-up for Phone Call        Pt informed  Follow-up by: Lamar Sprinkles, CMA,  April 09, 2010 8:51 AM    Prescriptions: ACTOS 30 MG  TABS (PIOGLITAZONE HCL) Take 1 tablet by mouth once a day  #30 x 0   Entered by:   Lamar Sprinkles, CMA   Authorized by:   Jacques Navy MD   Signed by:   Lamar Sprinkles, CMA on 04/09/2010   Method used:   Samples Given   RxID:   [id number] BENICAR 40 MG TABS (OLMESARTAN MEDOXOMIL) Take 1 tablet by mouth once a day  #14 x 0   Entered by:   Lamar Sprinkles, CMA   Authorized by:   Jacques Navy MD   Signed by:   Lamar Sprinkles, CMA on 04/09/2010   Method used:   Samples Given   RxID:   [id number]

## 2010-05-30 NOTE — Progress Notes (Signed)
  Phone Note Refill Request Message from:  Fax from Pharmacy on April 24, 2010 1:35 PM  Refills Requested: Medication #1:  COREG 12.5 MG TABS 1/2 tab  by mouth two times a day Initial call taken by: Ami Bullins CMA,  April 24, 2010 1:35 PM    Prescriptions: COREG 12.5 MG TABS (CARVEDILOL) 1/2 tab  by mouth two times a day  #30 x 3   Entered by:   Ami Bullins CMA   Authorized by:   Jacques Navy MD   Signed by:   Bill Salinas CMA on 04/24/2010   Method used:   Electronically to        [facility] (retail)       [address]       [place], [place]  [address]       Ph: [phone]       Fax: [phone]   RxID:   [id number]

## 2010-06-13 ENCOUNTER — Other Ambulatory Visit: Payer: Self-pay | Admitting: Internal Medicine

## 2010-06-13 ENCOUNTER — Encounter (INDEPENDENT_AMBULATORY_CARE_PROVIDER_SITE_OTHER): Payer: Self-pay | Admitting: *Deleted

## 2010-06-13 ENCOUNTER — Other Ambulatory Visit: Payer: Medicare Other

## 2010-06-13 ENCOUNTER — Ambulatory Visit (INDEPENDENT_AMBULATORY_CARE_PROVIDER_SITE_OTHER): Payer: Medicare Other | Admitting: Internal Medicine

## 2010-06-13 ENCOUNTER — Encounter: Payer: Self-pay | Admitting: Internal Medicine

## 2010-06-13 DIAGNOSIS — I10 Essential (primary) hypertension: Secondary | ICD-10-CM

## 2010-06-13 DIAGNOSIS — E785 Hyperlipidemia, unspecified: Secondary | ICD-10-CM

## 2010-06-13 DIAGNOSIS — M810 Age-related osteoporosis without current pathological fracture: Secondary | ICD-10-CM

## 2010-06-13 DIAGNOSIS — M545 Low back pain, unspecified: Secondary | ICD-10-CM

## 2010-06-13 LAB — BASIC METABOLIC PANEL
BUN: 20 mg/dL (ref 6–23)
CO2: 29 mEq/L (ref 19–32)
Calcium: 9.1 mg/dL (ref 8.4–10.5)
Chloride: 107 mEq/L (ref 96–112)
Creatinine, Ser: 0.8 mg/dL (ref 0.4–1.2)
GFR: 74.2 mL/min (ref >60.00)
Glucose, Bld: 89 mg/dL (ref 70–99)
Potassium: 3.8 mEq/L (ref 3.5–5.1)
Sodium: 143 mEq/L (ref 135–145)

## 2010-06-13 LAB — CALCIUM: Calcium: 9.1 mg/dL (ref 8.4–10.5)

## 2010-06-13 LAB — LIPID PANEL
Cholesterol: 193 mg/dL (ref 0–200)
HDL: 90.6 mg/dL (ref >39.00)
LDL Cholesterol: 88 mg/dL (ref 0–99)
Total CHOL/HDL Ratio: 2
Triglycerides: 72 mg/dL (ref 0.0–149.0)
VLDL: 14.4 mg/dL (ref 0.0–40.0)

## 2010-06-13 LAB — HEPATIC FUNCTION PANEL
ALT: 19 U/L (ref 0–35)
AST: 33 U/L (ref 0–37)
Albumin: 4 g/dL (ref 3.5–5.2)
Alkaline Phosphatase: 37 U/L — ABNORMAL LOW (ref 39–117)
Bilirubin, Direct: 0.1 mg/dL (ref 0.0–0.3)
Total Bilirubin: 0.5 mg/dL (ref 0.3–1.2)
Total Protein: 6.8 g/dL (ref 6.0–8.3)

## 2010-06-19 NOTE — Assessment & Plan Note (Signed)
Summary: Disuss reclast, sciatica..Discuss Med changes/sls,cma   Vital Signs:  Patient profile:   77 year old female Height:      63.75 inches Weight:      110 pounds BMI:     19.10 O2 Sat:      96 % on Room air Temp:     97.9 degrees F oral Pulse rate:   57 / minute Resp:     12 per minute BP sitting:   146 / 58  (left arm)  Vitals Entered By: Burnard Leigh CMA(AAMA) (June 13, 2010 2:14 PM)  O2 Flow:  Room air CC: Discuss Reclast & Scatia..Discuss Zostavax vaccine..Medication Sample request/sls,cma Is Patient Diabetic? No   Primary Care Provider:  Illene Regulus, MD  CC:  Discuss Reclast & Shane Crutch..Discuss Zostavax vaccine..Medication Sample request/sls and cma.  History of Present Illness: [name] presents to have reclast infusion scheduled- she is more than a year out.  She continues to have sciatica - now local back pain without radiation to the legs.  She has  been followed by Dr. Jarold Motto for atrophic gastritis.   Current Medications (verified): 1)  Benicar 40 Mg Tabs (Olmesartan Medoxomil) .... Take 1 Tablet By Mouth Once A Day 2)  Synthroid 75 Mcg Tabs (Levothyroxine Sodium) .... Take One Tablet By Mouth Once Daily. 3)  Aspirin 81 Mg  Tabs (Aspirin) .... Take One Tablet Daily 4)  Carafate 1 Gm  Tabs (Sucralfate) .... Take As Needed 5)  Neuro B-12 Forte S 1000 Mcg/ml Inj Soln (Cyanocobalamin) .... Take Monthly 6)  Coreg 12.5 Mg Tabs (Carvedilol) .... 1/2 Tab  By Mouth Two Times A Day 7)  Ayr Saline Nasal Drops 0.65 %  Soln (Saline) .... As Needed 8)  Furosemide 40 Mg  Tabs (Furosemide) .Marland Kitchen.. 1 By Mouth  Once Daily 9)  Actos 30 Mg  Tabs (Pioglitazone Hcl) .... Take 1 Tablet By Mouth Once A Day 10)  Lipitor 80 Mg  Tabs (Atorvastatin Calcium) .... Once Daily 11)  Florastor 250 Mg Caps (Saccharomyces Boulardii) .... Take One By Mouth Once Daily 12)  Norvasc 10 Mg Tabs (Amlodipine Besylate) .... Take 1 Tablet By Mouth Once A Day 13)  Calcium .Marland Kitchen.. 1 By Mouth  Daily 14)  Icaps  Caps (Multiple Vitamins-Minerals) .... Take 1 Capsule By Mouth Once A Day. 15)  Etodolac 400 Mg Tabs (Etodolac) .... One By Mouth Two Times A Day With Food As Needed For Low Back Pain 16)  Hydrocodone-Acetaminophen 7.5-500 Mg Tabs (Hydrocodone-Acetaminophen) .... Take Only Prn 17)  Fluticasone Propionate 50 Mcg/act Susp (Fluticasone Propionate) .... Use 2 Sprays To Each Nostril Qd  Allergies (verified): 1)  ! Sulfa 2)  ! Codeine  Past History:  Past Medical History: Last updated: 08/27/2009 APPENDECTOMY, HX OF (ICD-V45.79) HYSTERECTOMY, HX OF (ICD-V45.77) RENAL CALCULUS, HX OF (ICD-V13.01) PERIPHERAL VASCULAR DISEASE (ICD-443.9) ANEMIA, PERNICIOUS, HX OF (ICD-V12.3) ATROPHIC GASTRITIS (ICD-535.10) HYPOTHYROIDISM (ICD-244.9) HYPERLIPIDEMIA (ICD-272.4) HYPERTENSION (ICD-401.9) CORONARY ARTERY DISEASE (ICD-414.00) s/p cabg Carotid artery stenosis    --u/s 9/10: 40-59% Bilaterally. stable.  Weight loss Median Arcuate Ligament - follwed by Dr. Madilyn Fireman.   Past Surgical History: Last updated: 08/22/2008 Coronary artery bypass graft June '07 LIMA-LAD, SVX-Cx Hysterectomy needle bx thyroid Appendectomy thyroid right lobectomy FH reviewed for relevance, SH/Risk Factors reviewed for relevance  Review of Systems  The patient denies anorexia, weight loss, weight gain, chest pain, syncope, dyspnea on exertion, abdominal pain, severe indigestion/heartburn, muscle weakness, difficulty walking, and enlarged lymph nodes.    Physical Exam  General:  Well-developed,well-nourished,in no acute distress; alert,appropriate and cooperative throughout examination Head:  normocephalic and atraumatic.   Eyes:  C&S clear Neck:  supple.   Lungs:  normal respiratory effort.   Heart:  normal rate and regular rhythm.   Neurologic:  alert & oriented X3, cranial nerves II-XII intact, and gait normal.   Skin:  turgor normal and color normal.   Psych:  Oriented X3, normally  interactive, and good eye contact.     Impression & Recommendations:  Problem # 1:  OSTEOPOROSIS (ICD-733.00) Last DXA in Jan '11, due for follow-up in Jan '13. She is due for annual reclast infusion.  Plan - lab today: creatinine and calcium          to be set up for reclast.  Problem # 2:  LOW BACK PAIN, ACUTE (ICD-724.2) A continued problem. She cannot take NSAIDs due to atrophic gastritis. Her pain has changed and is motly in her back.   Plan - continued exercise           consider return to physical therapy.  Her updated medication list for this problem includes:    Aspirin 81 Mg Tabs (Aspirin) .Marland Kitchen... Take one tablet daily    Etodolac 400 Mg Tabs (Etodolac) ..... One by mouth two times a day with food as needed for low back pain    Hydrocodone-acetaminophen 7.5-500 Mg Tabs (Hydrocodone-acetaminophen) .Marland Kitchen... Take only prn  Problem # 3:  HYPERLIPIDEMIA (ICD-272.4) Due for follow up lab  Her updated medication list for this problem includes:    Lipitor 80 Mg Tabs (Atorvastatin calcium) ..... Once daily  Orders: TLB-Lipid Panel (80061-LIPID) TLB-Hepatic/Liver Function Pnl (80076-HEPATIC)  Addendum - reasonable control of lipids with LDL 88.  Plan - continue present medication.  Complete Medication List: 1)  Benicar 40 Mg Tabs (Olmesartan medoxomil) .... Take 1 tablet by mouth once a day 2)  Synthroid 75 Mcg Tabs (Levothyroxine sodium) .... Take one tablet by mouth once daily. 3)  Aspirin 81 Mg Tabs (Aspirin) .... Take one tablet daily 4)  Carafate 1 Gm Tabs (Sucralfate) .... Take as needed 5)  Neuro B-12 Forte S 1000 Mcg/ml Inj Soln (Cyanocobalamin) .... Take monthly 6)  Coreg 12.5 Mg Tabs (Carvedilol) .... 1/2 tab  by mouth two times a day 7)  Ayr Saline Nasal Drops 0.65 % Soln (Saline) .... As needed 8)  Furosemide 40 Mg Tabs (Furosemide) .Marland Kitchen.. 1 by mouth  once daily 9)  Actos 30 Mg Tabs (Pioglitazone hcl) .... Take 1 tablet by mouth once a day 10)  Lipitor 80 Mg Tabs  (Atorvastatin calcium) .... Once daily 11)  Florastor 250 Mg Caps (Saccharomyces boulardii) .... Take one by mouth once daily 12)  Norvasc 10 Mg Tabs (Amlodipine besylate) .... Take 1 tablet by mouth once a day 13)  Calcium  .Marland KitchenMarland Kitchen. 1 by mouth daily 14)  Icaps Caps (Multiple vitamins-minerals) .... Take 1 capsule by mouth once a day. 15)  Etodolac 400 Mg Tabs (Etodolac) .... One by mouth two times a day with food as needed for low back pain 16)  Hydrocodone-acetaminophen 7.5-500 Mg Tabs (Hydrocodone-acetaminophen) .... Take only prn 17)  Fluticasone Propionate 50 Mcg/act Susp (Fluticasone propionate) .... Use 2 sprays to each nostril qd  Other Orders: TLB-BMP (Basic Metabolic Panel-BMET) (80048-METABOL) TLB-Calcium (82310-CA)  Patient: [name] Note: All result statuses are Final unless otherwise noted.  Tests: (1) BMP (METABOL)   Sodium  143 mEq/L                   135-145   Potassium                 3.8 mEq/L                   3.5-5.1   Chloride                  107 mEq/L                   96-112   Carbon Dioxide            29 mEq/L                    19-32   Glucose                   89 mg/dL                    16-10   BUN                       20 mg/dL                    9-60   Creatinine                0.8 mg/dL                   4.5-4.0   Calcium                   9.1 mg/dL                   9.8-11.9   GFR                       74.20 mL/min                >60.00  Tests: (2) Calcuim (CA)   Calcium                   9.1 mg/dL                   1.4-78.2  Tests: (3) Lipid Panel (LIPID)   Cholesterol               193 mg/dL                   9-562     ATP III Classification            Desirable:  < 200 mg/dL                    Borderline High:  200 - 239 mg/dL               High:  > = 240 mg/dL   Triglycerides             72.0 mg/dL                  1.3-086.5     Normal:  <150 mg/dL     Borderline High:  784 - 199 mg/dL   HDL                       69.62  mg/dL                 >  39.00   VLDL Cholesterol          14.4 mg/dL                  1.6-10.9   LDL Cholesterol           88 mg/dL                    6-04  CHO/HDL Ratio:  CHD Risk                             2                    Men          Women     1/2 Average Risk     3.4          3.3     Average Risk          5.0          4.4     2X Average Risk          9.6          7.1     3X Average Risk          15.0          11.0                           Tests: (4) Hepatic/Liver Function Panel (HEPATIC)   Total Bilirubin           0.5 mg/dL                   5.4-0.9   Direct Bilirubin          0.1 mg/dL                   8.1-1.9   Alkaline Phosphatase [L]  37 U/L                      39-117   AST                       33 U/L                      0-37   ALT                       19 U/L                      0-35   Total Protein             6.8 g/dL                    1.4-7.8   Albumin                   4.0 g/dL                    2.9-5.6  Orders Added: 1)  TLB-BMP (Basic Metabolic Panel-BMET) [80048-METABOL] 2)  TLB-Calcium [82310-CA] 3)  TLB-Lipid Panel [80061-LIPID] 4)  TLB-Hepatic/Liver Function Pnl [80076-HEPATIC] 5)  Est. Patient Level III [21308]   Immunization History:  Influenza Immunization History:    Influenza:  historical (02/26/2010)   Immunization History:  Influenza  Immunization History:    Influenza:  Historical (02/26/2010)   Preventive Care Screening  Mammogram:    Date:  02/26/2010    Results:  normal

## 2010-07-12 ENCOUNTER — Telehealth: Payer: Self-pay | Admitting: Internal Medicine

## 2010-07-15 ENCOUNTER — Other Ambulatory Visit: Payer: Self-pay | Admitting: Internal Medicine

## 2010-07-15 ENCOUNTER — Other Ambulatory Visit: Payer: Medicare Other

## 2010-07-15 ENCOUNTER — Encounter (INDEPENDENT_AMBULATORY_CARE_PROVIDER_SITE_OTHER): Payer: Self-pay | Admitting: *Deleted

## 2010-07-15 DIAGNOSIS — M810 Age-related osteoporosis without current pathological fracture: Secondary | ICD-10-CM

## 2010-07-15 LAB — CREATININE, SERUM: Creatinine, Ser: 0.8 mg/dL (ref 0.4–1.2)

## 2010-07-15 LAB — CALCIUM: Calcium: 8.4 mg/dL (ref 8.4–10.5)

## 2010-07-25 NOTE — Progress Notes (Signed)
Summary: Reclast labs  Phone Note Call from Patient Call back at Home Phone [phone]   Summary of Call: Pt states that hospital told her that her labs were outdated (06/13/10) for Reclast. Pt request new order for labs to be done. Initial call taken by: Burnard Leigh [facility]),  July 12, 2010 4:30 PM  Follow-up for Phone Call        labs have been ordered. Pt informed. Follow-up by: Burnard Leigh [facility]),  July 15, 2010 8:14 AM

## 2010-08-13 ENCOUNTER — Encounter (HOSPITAL_COMMUNITY): Payer: Medicare Other

## 2010-08-13 ENCOUNTER — Ambulatory Visit (HOSPITAL_COMMUNITY): Payer: Medicare Other | Attending: Internal Medicine

## 2010-08-13 DIAGNOSIS — M810 Age-related osteoporosis without current pathological fracture: Secondary | ICD-10-CM | POA: Insufficient documentation

## 2010-08-15 ENCOUNTER — Telehealth: Payer: Self-pay | Admitting: *Deleted

## 2010-08-15 DIAGNOSIS — IMO0002 Reserved for concepts with insufficient information to code with codable children: Secondary | ICD-10-CM

## 2010-08-15 DIAGNOSIS — M79609 Pain in unspecified limb: Secondary | ICD-10-CM

## 2010-08-15 NOTE — Telephone Encounter (Signed)
Patient requesting referral for physical therapy

## 2010-08-23 NOTE — Telephone Encounter (Signed)
Pt aware that referral was entered, Please help w/this as original req to MD was 4/19

## 2010-08-23 NOTE — Telephone Encounter (Signed)
K. Order in

## 2010-08-30 ENCOUNTER — Other Ambulatory Visit: Payer: Self-pay | Admitting: *Deleted

## 2010-08-30 MED ORDER — CARVEDILOL 12.5 MG PO TABS: 6.2500 mg | ORAL_TABLET | Freq: Two times a day (BID) | ORAL | Status: DC

## 2010-09-10 NOTE — Assessment & Plan Note (Signed)
[facility]                         GASTROENTEROLOGY OFFICE NOTE   NAME:[name], [name]                     MRN:          [id number]  DATE:05/11/2007                            DOB:          Jun 15, 1932    HISTORY:  [name] is a 77 year old white female who has pernicious anemia  and chronic atrophic gastritis.  She has had some nausea and vague  epigastric discomfort, associated with anorexia, weight loss, and is  referred through the courtesy of Dr. Rosalyn Gess. Norins for evaluation.   PAST MEDICAL HISTORY:  The patient is also followed by Dr. Alfonse Alpers.  Gegick and has a history of chronic thyroid dysfunction, on Synthroid 50  mcg daily.  She has had some glucose intolerance, but is not felt to be  diabetic.  She has had extensive laboratory evaluation by Dr. Dagoberto Ligas and  Dr. Debby Bud, without any etiology determined of her mild anorexia and  weight loss.  I followed her for many years and she has B12 deficiency  and is on B12 replacement therapy.  She in the past has had some  nonspecific diarrhea and has had a thorough GI workups including a  colonoscopy, multiple colon biopsies, CT scans, ultrasounds and  endoscopic examinations.  She also had a small bowel biopsy in 2002,  along with celiac antibodies, all of which were normal.   She recently has been found to have coronary artery disease and had  bypass surgery this past fall.  Associated with that was an initial  weight loss, but her anorexia and weight loss have pretty much  continued, although her weight is stable.  In addition to chronic  thyroid dysfunction and coronary artery disease, she has osteoporosis  with a long history of multiple drug allergies.   FAMILY HISTORY:  Remarkable for multiple types of cancer, but no colon  cancer.  Her mother does have cholelithiasis, and several members have  diabetes.   CURRENT MEDICATIONS:  1. Lasix 20 mg daily.  2. Benicar 40 mg daily.  3.  Carvedilol 25 mg twice daily.  4. Simvastatin 40 mg daily.  5. Synthroid 50 mcg daily.  6. Aspirin 81 mg daily.  7. B12 injections every three months.  8. Actos 30 mg daily.  9. Allergy injections.   ALLERGIES:  SULFA AND CODEINE.   LABORATORY DATA:  A recent hemoglobin A1c was 5.8%.  She had a negative  ANA, a sedimentation rate of 13, normal liver function tests.  CBC has  shown a hemoglobin of 12.1, hematocrit 35.8.   PHYSICAL EXAMINATION:  GENERAL:  She is a thin, slightly pale-appearing  white female, in no acute distress, appearing older than her stated age.  VITAL SIGNS:  Weight 106 pounds, with her normal weight being  approximately 121 pounds.  Blood pressure 218/58, pulse 64 and regular.  HEENT:  I could not appreciate stigmata of chronic liver disease.  CHEST:  Clear.  HEART:  She appeared to be in a regular rhythm without significant  murmurs, gallops or rubs.  ABDOMEN:  I could not appreciate hepatosplenomegaly, abdominal masses or  significant tenderness.  Bowel  sounds are normal.   ASSESSMENT:  1. Systolic hypertension in a patient who has coronary artery disease.  2. Anorexia and weight loss, with a long history of pernicious anemia      - rule out gastric atypia.  3. Chronic thyroid dysfunction, on Synthroid.  4. New onset glucose intolerance, raises the possibility of underlying      chronic pancreatic disease.   RECOMMENDATIONS:  1. Repeat laboratory data including electrolytes as per her wishes.  2. Outpatient ultrasonography and endoscopy.  3. Continue current medications.  4. I will have Dr. Rosalyn Gess. Norins check with her again for her      systolic hypertension.He relates to increase lasix to 40 mg/day.     Vania Rea. Jarold Motto, MD, Caleen Essex, FAGA  Electronically Signed    DRP/MedQ  DD: 05/11/2007  DT: 05/11/2007  Job #: [id number]   cc:   Rosalyn Gess. Norins, MD  Bevelyn Buckles. Bensimhon, MD

## 2010-09-10 NOTE — Assessment & Plan Note (Signed)
[facility]                            CARDIOLOGY OFFICE NOTE   NAME:[name], [name]                     MRN:          [id number]  DATE:12/31/2007                            DOB:          Oct 19, 1932    PRIMARY CARE PHYSICIAN:  Rosalyn Gess. Norins, MD   INTERVAL HISTORY:  Ms. Alguire is 77 year old woman with a history of  hypertension as well as coronary artery disease, status post two-vessel  bypass in May 2007.  She also has hyperlipidemia, hypothyroidism, and  moderate carotid artery stenosis.   Overt the past year, she has had a credible amount of difficulty with  weight loss.  She has had exhaustive evaluation by GI.  She also got a  CT scan of her belly due to hypertension to rule out renal artery  stenosis.  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  She was referred to Dr. Madilyn Fireman and Dr. Excell Seltzer for  further evaluation on looking at that they felt that she had good  collaterals and despite it was not the reason for her weight loss.   She returns today for routine visit.  Overall, she is doing fairly well.  She is exercising on a treadmill every day without any chest pain or  shortness of breath.  She does have indigestion at times.  Recently, her  blood pressure has been creeping up, usually it is in the 130s, but  recently she had 1 reading in the 190s.  Her diltiazem was increased.  Subsequently, she has now had some bradycardia and says her heart rate  has gotten as low as 41.  She denies syncope or presyncope.   CURRENT MEDICATIONS:  1. Lasix 40 mg a day.  2. Benicar 40 a day.  3. Carvedilol 25 b.i.d.  4. Simvastatin 40 a day.  5. Synthroid 50 mcg a day.  6. Aspirin 81 a day.  7. Actos 30 a day.  8. Diltiazem 240 a day.   PHYSICAL EXAMINATION:  GENERAL:  She is in no acute distress, ambulatory  around the clinic without any respiratory difficulty.  VITAL SIGNS:  Blood pressure is 200/60 and  heart rate is 55.  HEENT:  Normal.  NECK:  Supple.  No JVD.  Carotids are 2+ bilateral without bruits.  There is no lymphadenopathy or thyromegaly.  CARDIAC:  She is bradycardic and regular.  A 2/6 systolic ejection  murmur at the left sternal border.  No rub or gallop.  LUNGS:  Clear.  ABDOMEN:  Soft, nontender, and nondistended.  No hepatosplenomegaly.  No  bruits.  No masses.  Good bowel sounds.  EXTREMITIES:  Warm with no cyanosis, clubbing or edema.  No rash.  NEURO:  Alert and oriented x3.  Cranial nerves II-XII are grossly  intact.  Moves all 4 extremities difficulty.  Affect is pleasant.   EKG shows sinus bradycardia at a rate of 55 with no ST-T wave  abnormalities.   ASSESSMENT AND PLAN:  1. Coronary artery disease.  This is stable.  No evidence of ischemia.      Continue  current therapy.  2. Hypertension.  Blood pressure is elevated.  I suspect, she has a      component of white coat hypertension, given her bradycardia.  We      will switch her diltiazem over to Norvasc 10.  She will follow with      Dr. Debby Bud.  I could consider adding spironolactone for help with      her blood pressure control.  3. Carotid artery stenosis.  She is due for her ultrasound.  4. Hyperlipidemia is followed by Dr. Debby Bud as well.  Continue her      statin.  Goal LDL is less than 70.   DISPOSITION:  We will see her back in 9 months for followup.     Bevelyn Buckles. Bensimhon, MD  Electronically Signed    DRB/MedQ  DD: 12/31/2007  DT: 01/01/2008  Job #: [id number]

## 2010-09-10 NOTE — Consult Note (Signed)
NEW PATIENT CONSULTATION   [name], [name]  DOB:  02/23/1933                                       09/02/2007  ZOXWR#:[id number]   REFERRING PHYSICIAN:  Dr. Tonny Bollman.   PRIMARY CARE PHYSICIAN:  Dr. Illene Regulus.   GASTROENTEROLOGIST:  Dr. Sheryn Bison.   REASON FOR CONSULTATION:  Weight loss and chronic abdominal pain.   HISTORY:  The patient is a 77 year old female with a long history of  gastrointestinal complaints.  She has a long history of pernicious  anemia and has undergone extensive workup by Dr. Sheryn Bison for  weight loss and abdominal pain.  Workup includes an upper GI endoscopy,  colonoscopy, CT scans, abdominal ultrasounds, and small bowel biopsy.   Workup with CT angiogram of the abdomen revealed a proximal celiac  artery stenosis.  The superior mesenteric artery widely patent.  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   The patient reports significant weight loss despite reasonable appetite  and caloric intake.  She notes weight loss dating back to coronary  artery bypass that was carried out in May of 2007.  She has continued to  have approximately 20 pound weight loss since that time.  Does feel her  caloric intake is adequate.  Eats three regular meals a day including in  addition to a bedtime snack.   She notes occasional nausea.  Does have some cramping, central abdominal  discomfort, bloating, gas, and occasional loose stools.  Denies melena  or hematochezia.  No frank vomiting.   PAST MEDICAL HISTORY:  1. Coronary artery disease status post coronary artery bypass 2007.  2. Hypothyroidism.  3. Pernicious anemia.  4. Retention.  5. Hyperlipidemia.   PAST SURGICAL HISTORY:  1. Coronary artery bypass 2007.  2. Hysterectomy 1981.   MEDICATIONS:  Carvedilol 25 mg Valdez.i.d., Benicar 40 mg daily, furosemide  40 mg daily, Lipitor, or 80 mg daily, Diltiazem XR 180 mg daily, Actos  30 mg daily, aspirin 81 mg daily, allergy shots once weekly, B12 shot  once monthly, Carafate p.r.n.   ALLERGIES:  Sulfa.   FAMILY HISTORY:  Mother died of heart disease at age 1.  Father died at  age 64 of lung cancer.  She has a sister who died at age 62 of lung  cancer.   SOCIAL HISTORY:  The patient is married.  Has no children.  She lives  locally with her husband.  No history of tobacco or alcohol use.  Exercises irregularly.   REVIEW OF SYSTEMS:  Refer to patient encounter form.  Complete 12 point  review of systems was performed.  Pertinent positive findings include  weight loss, heart murmur, reflux and hiatal hernia, muscle and joint  discomfort.   PHYSICAL EXAM:  Alert, oriented 74-year female.  No acute distress.  Vital Signs:  BP is 181/76, pulse 58 per minute and regular,  respirations 16 per minute.  HEENT:  Mouth and throat are clear.  Normocephalic.  No scleral icterus.  Neck:  Supple.  No thyromegaly or  adenopathy.  Cardiovascular:  Normal heart sounds without murmurs.  Regular rate and rhythm.  No carotid bruits.  No gallops or rubs.  Chest:  Clear equal air entry bilaterally.  Abdomen:  Soft.  Mild  epigastric tenderness.  Central abdominal bruit.  No masses or  organomegaly.  Extremities:  No peripheral edema.  2+ femoral pulses  bilaterally.  Full range of motion.  Neurologic:  Cranial nerves intact.  Strength equal bilaterally.  Normal reflexes.  Lymphatics:  No  adenopathy.  Skin:  Warm, dry, and intact.   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   IMPRESSION:  1. Chronic persistent weight loss with adequate caloric intake and      mild abdominal pain.  CT scan suspicious for median arcuate      ligament compression of celiac axis.  2. Pernicious anemia.  3. Hypertension.  4.  Coronary artery disease.  5. Hypothyroidism.  6. Hyperlipidemia.   RECOMMENDATIONS:  Symptoms are vague, although the patient does have  persistent weight loss.  No defining symptoms of mesenteric ischemia  associated with median arcuate ligament syndrome.   I have recommended followup in 6 months with accurate weight measurement  and caloric intake.  Will obtain a protein levels and blood work on the  next visit.   Balinda Quails, M.D.  Electronically Signed   PGH/MEDQ  D:  09/02/2007  T:  09/03/2007  Job:  [id number]   cc:   Veverly Fells. Excell Seltzer, MD  Rosalyn Gess. Norins, MD  Vania Rea. Jarold Motto, MD, Caleen Essex, FAGA  Alfonse Alpers Dagoberto Ligas, M.D.

## 2010-09-10 NOTE — Letter (Signed)
July 01, 2007    Sally Gess. Norins, MD  [address]  [place], [place] [address]   RE:  [name], [name]  MRN:  [id number]  /  DOB:  07/02/1932   Dear Casimiro Needle,   It was my pleasure to see [name] as an outpatient at the [facility]  peripheral vascular office on July 01, 2007.   As you know, [name] is a delightful 76 year old woman who is  referred for evaluation of celiac artery stenosis in the setting of  chronic abdominal pain and weight loss.   HISTORY OF PRESENT ILLNESS:  [name] has longstanding  gastrointestinal symptoms.  She has a long history of pernicious anemia  and recently was evaluated by [name] with gastroenterology.  She  has had extensive GI evaluation in the past that has included upper  endoscopy as well as colonoscopy, multiple colon biopsies, CT scans and  abdominal ultrasounds. She has also had a small bowel biopsy that was  negative for celiac antibodies.   Her blood pressure has been exceedingly difficult to control and she  recently underwent a CT angiogram of the abdomen to evaluate for renal  artery stenosis.  While her renal arteries were widely patent, critical  stenosis of the proximal celiac artery was identified.  The superior  mesenteric artery was widely patent. With the finding of severe celiac  artery stenosis, I was asked to evaluate [name] for the clinical  significance of this finding.   She reports persistent weight loss in spite of a reasonable appetite as  well as reasonably good caloric intake.  [name] complains of  postprandial nausea as well as chronic diarrhea.  She has mild  epigastric pain with eating but this does not limit her oral intake.  She eats 3 meals a day as well as an afternoon snack and a 200 calories  bedtime snack.  In spite of her intake, she reports a nearly early 25  pound weight loss since coronary bypass surgery in May 2007. She has  been able to remain active and she  exercises regularly on a treadmill  with 30 minutes of walking 5 times a week as well as biking. She has no  exertional symptoms. She denies chest pain, dyspnea, orthopnea, PND or  edema.   CURRENT MEDICATIONS:  1. Lasix 20 mg two daily.  2. Benicar 40 mg daily.  3. Carvedilol 25 mg twice daily.  4. Simvastatin 40 mg daily.  5. Synthroid 50 mcg daily.  6. Aspirin 81 mg daily.  7. Actos 30 mg daily.  8. Diltiazem 180 mg daily.  9. B12 injections.   ALLERGIES:  SULFA and CODEINE.   PAST MEDICAL HISTORY:  1. Coronary artery disease status post CABG in 2007.  2. Hypothyroidism.  3. Pernicious anemia.  4. Total abdominal hysterectomy in 1981.  5. Malignant hypertension.  6. Hyperlipidemia.   FAMILY HISTORY:  The patient's mother died of heart disease at age 54.  Father died at age 28 of lung cancer.  She has a sister who died at age  63 of lung cancer.   SOCIAL HISTORY:  The patient is married.  She has no children.  She  lives locally with her husband.  No history of cigarettes or alcohol.  Regular exercise as noted above.   REVIEW OF SYSTEMS:  A complete 12-point review of systems was performed.  Pertinent positives included seasonal allergies, hiatal hernia, diffuse  joint pain secondary to arthritis. Other systems were  reviewed and are  negative except as outlined in the history of present illness.   PHYSICAL EXAM:  The patient is alert and oriented.  She is in no acute  distress.  She is a thin female who appears older than her stated age.  Weight 102, blood pressure 210/70 right arm, 210/80 left arm, heart rate  53, respiratory rate 16.  HEENT:  Normal.  NECK:  Normal carotid upstrokes without bruits.  Jugular venous pressure  is normal.  No thyromegaly or thyroid nodules are palpable.  LUNGS:  Clear bilaterally.  HEART:  Regular rate and rhythm without murmurs or gallops.  The apex is  discreet and nondisplaced.  ABDOMEN:  Soft and nontender.  There is a loud  epigastric bruit. The  aorta is easily palpable but is not enlarged.  BACK:  There is no CVA tenderness.  EXTREMITIES:  No clubbing, cyanosis or edema.  Peripheral pulses 2+ and  equal throughout.  SKIN:  Warm and dry without rash.  NEUROLOGIC:  Cranial nerves II-XII are intact.  Strength 5/5 and equal  in the arms and legs.  LYMPHATICS:  No adenopathy.   CT scan of the abdomen with contrast as noted above shows widely patent  renal arteries with severe stenosis of the proximal celiac artery  approximately 1 cm into the vessel.  The superior mesenteric artery is  widely patent.   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  I have  reviewed her CT angiogram in detail.  The appearance of a critical  stenosis that is not ostial in nature is classic for external  compression from the median arcuate ligament, i.e. median arcuate  ligament syndrome.  The big question is whether this has anything to do  with her clinical symptoms.  While her clinical history is not classic  for intestinal angina, she continues to lose weight in spite of what  sounds like adeuate caloric intake. She also has a widely patent  superior mesenteric artery which generally supplies intestinal  collaterals which are sufficient for intestinal blood flow.  There is a  lot of controversy regarding this syndrome because clinical response to  surgery is widely variable even in the setting of severe stenosis of the  celiac artery.  If we decide treatment is necessary, the only treatment  for this problem is surgical decompression of the vessel.  I have looked  through the literature and do not see of any reports of endovascular of  treatment.  I am going to review her study and clinical presentation  with vascular surgery to get a second opinion.   Casimiro Needle, I will be in touch after I review her CT findings with one of  the vascular surgeons.  I reviewed thes recommendations in detail with  [name]  and her husband today.   Thanks again for asking me to see this very nice lady.  Please feel free  to call at any time with questions.    Sincerely,      Veverly Fells. Excell Seltzer, MD  Electronically Signed    MDC/MedQ  DD: 07/01/2007  DT: 07/02/2007  Job #: [id number]   CC:   Vania Rea. Jarold Motto, MD, Caleen Essex, FAGA  Bevelyn Buckles. Bensimhon, MD

## 2010-09-10 NOTE — Assessment & Plan Note (Signed)
OFFICE VISIT   [name], [name]  DOB:  1932/05/18                                       04/13/2008  UEAVW#:[id number]   The patient is followed up again for her history of chronic abdominal  pain and weight loss and an issue of possible median arcuate ligament  syndrome.   She has a long history of pernicious anemia, weight loss and abdominal  pain.   CT angiography has revealed stenosis of the proximal celiac artery  consistent with median arcuate ligament syndrome.  Superior mesenteric  artery widely patent.   Her recent appetite has been fairly good and mild abdominal pain.  No  frank postprandial pain.  Her weight today is 103.5 pounds.   Denies nausea or vomiting.  No diarrhea, mild constipation.  Continues  to eat 3 meals daily with a bedtime snack.   PHYSICAL EXAMINATION:  General:  A moderately frail-appearing 77-year-  old female.  No acute distress.  Vital Signs:  BP is 165/55 and pulse 62  per minute.  Abdomen:  Soft, nontender.  Aorta easily palpable, normal  in size.  Mid abdominal bruit.  Normal bowel sounds.  No organomegaly or  masses.   The patient seems fairly stable from a GI point of view.  I do not feel  she is having significant mesenteric issues with regard to ischemia.  The question of median arcuate ligament syndrome is always difficult to  decipher.  I favor at present continued follow-up and will see her again  in 6 months.   Balinda Quails, M.D.  Electronically Signed   PGH/MEDQ  D:  04/13/2008  T:  04/14/2008  Job:  [id number]   cc:   Veverly Fells. Excell Seltzer, MD  Rosalyn Gess. Norins, MD  Vania Rea. Jarold Motto, MD, Caleen Essex, FAGA

## 2010-09-10 NOTE — Assessment & Plan Note (Signed)
[facility]                           PRIMARY CARE OFFICE NOTE   NAME:CAMPBELLImojean, Valdez                     MRN:          [id number]  DATE:11/23/2006                            DOB:          11/21/1932    Valdez Valdez is a 77 year old woman last seen June 29, 2006.  She  presents today because her blood pressure has been running high for the  past week or so with systolics greater than 180 at times, diastolics in  the high 80s.  Patient has been asymptomatic.   Patient does report she has been having increasing amounts of pain and  discomfort starting in her left neck, radiating down to  her scapular  area, trapezius area and down her entire arm to her hands.  She has had  some paresthesias with numbness and tingling.  She has had no loss of  strength.  She has had no total loss of sensation.  She does have a  known history of two bulging disks in the cervical spine.   CURRENT MEDICATIONS:  1. Benicar/hydrochlorothiazide 40/25 daily.  2. Coreg 12.5 mg b.i.d.  3. Pravachol 40 mg one half tablet daily.  4. Synthroid 50 mcg daily.  5. Aspirin 81 mg daily.  6. B12 every two to three months.  7. Allergy shots weekly.  8. Carafate 1 g a.c. and h.s.  9. Ocu-Vite.  10.Patient uses Astelin nasal spray and nasal saline p.r.n.   PHYSICAL EXAMINATION:  VITAL SIGNS:  Temperature was not taken.  Blood  pressure 187/76 on the right, 183/81 on the left, pulse 64.  GENERAL ANESTHESIA:  This is a slender woman in no acute distress.  EXTREMITIES:  Patient has good range of motion of her shoulder, elbow,  wrists and fingers.  She has normal DTRs at the biceps and radial  tendons.  She had normal grip strength.   ASSESSMENT/PLAN:  1. Arm and neck pain.  Suspect the patient has inflammation or      irritation of her cervical nerve roots possibly from her bulging      disks.  She has no clear-cut severe radiculopathy and would not be      a candidate for any  emergency surgical intervention nor is there a      need for MRI scanning.  PLAN:  Prednisone bursts and taper given      the patient's chronic gastritis and inability to take nonsteroidal      drugs using prednisone 30 mg daily x3, 20 mg daily x3, 10 mg daily      x6 and then off.  2. Hypertension.  Patient's blood pressure has been elevated in the      144 range over her last two visits, but currently is markedly      elevated at this time.  This may be secondary to pain.      Nonetheless, in the face of chronic elevation of her blood      pressure, will modify her medications by increasing her Coreg to 25      mg b.i.d., continuing Benicar and  hydrochlorothiazide at their present doses.  Should this fail to      control the patient's blood      pressure, would consider discontinuing the hydrochlorothiazide      portion of Benicar/hydrochlorothiazide and starting a root diuretic      instead.     Rosalyn Gess Norins, MD  Electronically Signed    MEN/MedQ  DD: 11/23/2006  DT: 11/24/2006  Job #: [id number]

## 2010-09-10 NOTE — Assessment & Plan Note (Signed)
[facility]                            CARDIOLOGY OFFICE NOTE   NAME:[name], Medal                     MRN:          [id number]  DATE:11/17/2006                            DOB:          1932-09-15    PRIMARY CARE PHYSICIAN:  Rosalyn Gess. Norins, MD   INTERVAL HISTORY:  Ms. Renshaw is a  77 year old woman with a history  of coronary artery disease with left main disease, status post two-  vessel bypass with Dr. Donata Clay in May 2007.  Postoperatively she had  some atrial fibrillation and was treated briefly with amiodarone.  Remainder of medical history is significant for hypertension,  hyperlipidemia, hypothyroidism and carotid artery stenosis.  She is here  for routine follow-up.   She says she is doing quite well.  She has been exercising at the gym,  walking on the treadmill about 2.2 miles an hour without any chest pain.  She said every once in awhile she does feel a transient gurgle in her  chest but no real angina.  She also notes some pain in her back and left  shoulder, which has been chronic for her.  She has not had any heart  failure.  She has been compliant with her medications.   On physical exam, she is well-appearing, in no acute distress, ambulates  around the clinic without any respiratory difficulty.  Blood pressure initially 140/70, on manual recheck 190/70, heart rate  60, weight is 114.  HEENT:  Normal.  NECK:  Supple.  There is no JVD.  Carotids are 2+ bilaterally with a  question of a bruit on the left.  No lymphadenopathy or thyromegaly.  CARDIAC:  Regular rate and rhythm.  No murmur, rub or gallop.  LUNGS:  Clear.  ABDOMEN:  Soft, nontender, nondistended, no hepatosplenomegaly, no  bruits, no masses.  Good bowel sounds.  EXTREMITIES:  Warm with no cyanosis, clubbing or edema.  No rash.  NEUROLOGIC:  Alert and oriented x3.  Cranial nerves II-XII are intact.  Moves all four extremities without difficulty.  Affect is  pleasant.   EKG shows sinus bradycardia at a rate of 55.  No significant ST-T wave  abnormalities.   ASSESSMENT AND PLAN:  1. Coronary artery disease, status post bypass.  This is stable.  No      evidence of angina.  Continue on medical therapy.  I congratulated      her on her exercise program and urged her to continue and try to      get up to 3 miles an hour on the treadmill.  2. Hypertension.  This has always been labile.  I have asked her to      keep a blood pressure log at home and follow up with me or Dr.      Debby Bud.  She says usually home blood pressure is about 130.  I told      her if her blood pressure is routinely over 140, she should contact      [place] in the near future.  3. Hyperlipidemia, followed by Dr. Debby Bud.  Given her  coronary artery      disease, would titrate her statin to keep her LDL below 70.  4. Carotid artery stenosis.  This was extensively evaluated in the      hospital and thought to be nonobstructive.  We will get a carotid      ultrasound to follow up.   DISPOSITION:  Return to clinic in 6 months.     Bevelyn Buckles. Bensimhon, MD  Electronically Signed    DRB/MedQ  DD: 11/17/2006  DT: 11/18/2006  Job #: [id number]

## 2010-09-10 NOTE — Assessment & Plan Note (Signed)
[facility]                            CARDIOLOGY OFFICE NOTE   NAME:[name], [name]                     MRN:          [id number]  DATE:05/04/2007                            DOB:          1932-08-21    PRIMARY CARE PHYSICIAN:  Rosalyn Gess. Norins, MD.   ENDOCRINOLOGIST:  Alfonse Alpers. Dagoberto Ligas, M.D.   INTERVAL HISTORY:  Ms. Kneisley is a 77 year old woman with a history of  coronary artery disease status post two vessel bypass with Dr. Donata Clay  in May of 2007.  She also has a history of hypertension, hyperlipidemia,  and hypothyroidism.  She returns for routine follow up.   From a cardiac perspective she is doing well.  She continues to walk on  her treadmill without any chest pain or shortness of breath.  However,  she has been troubled by persistent weight loss and has now lost almost  25 pounds since her bypass surgery.   CURRENT MEDICATIONS:  1. Synthroid 50 mcg a day.  2. Aspirin 81.  3. Allergy shots.  4. Coreg 25 b.i.d.  5. Benicar 40 a day.  6. Lasix 20 a day.  7. Simvastatin 40 a day.  8. Actos 30 a day.   PHYSICAL EXAMINATION:  She is thin and relatively well appearing.  No  acute distress.  Ambulates around the clinic without respiratory  difficulty.  Blood pressure is 119/80.  Heart rate is 57 and weight is  106.  HEENT:  Normal.  NECK:  Supple.  No jugular venous distension.  Carotids are 2+  bilaterally without bruits.  There is no lymphadenopathy or thyromegaly.  CARDIAC:  PMI is nondisplaced.  She is regular.  No murmur, rub or  gallop.  LUNGS:  Clear.  ABDOMEN:  Soft, nontender, nondistended.  No hepatosplenomegaly.  No  bruits.  No masses.  Good bowel sounds.  EXTREMITIES:  Warm.  No cyanosis, clubbing or edema.  No rash.  NEURO:  Alert and oriented x3.  Cranial nerves II-XII are intact.  Moves  all four extremities without difficulty.  Affect is pleasant.   EKG shows sinus bradycardia, rate of 57.  No ST-T wave  abnormalities.   ASSESSMENT/PLAN:  1. Coronary artery disease - post bypass surgery.  She is quite      stable.  Continue current therapy.  2. Hypertension - well controlled.  3. Hyperlipidemia - is followed by Dr. Dagoberto Ligas and Dr. Debby Bud.  Given      her coronary artery disease, goal of  LDL is less than 70.      Continue statin.  4. Weight loss - This is quite concerning; however, she has two very      good internists, Dr. Dagoberto Ligas and Dr. Debby Bud on the case, and      hopefully this will be self-limiting.     Bevelyn Buckles. Bensimhon, MD  Electronically Signed    DRB/MedQ  DD: 05/04/2007  DT: 05/04/2007  Job #: [id number]   cc:   Rosalyn Gess. Norins, MD  Alfonse Alpers. Dagoberto Ligas, M.D.

## 2010-09-10 NOTE — Assessment & Plan Note (Signed)
OFFICE VISIT   [name], [name]  DOB:  [birth year]                                       09/28/2008  NFAOZ#:[id number]   The patient returns to the office today for continued followup of her  chronic abdominal pain and weight loss with possible median arcuate  ligament syndrome.   She does continue to have moderate GI symptoms.  Occasional abdominal  discomfort.  She describes her weight as being very stable.  I did weigh  her in the office today and on our scale her weight is unchanged over  the past 6 months at 103.5 pounds.   She has occasional diarrhea.  Bowel habits are at least daily.  She is  able to eat three meals daily.  Denies nausea or vomiting.   PHYSICAL EXAMINATION:  General:  Evaluation reveals a 75-hour-old  female, moderately frail.  No acute distress.  Vital signs:  BP 144/57,  pulse 50 per minute, respirations 18 minute.  Abdomen:  Soft and  nontender.  No palpable masses.  No organomegaly.  Normal bowel sounds  without bruits.   The patient remains fairly stable from a GI standpoint.  Her weight is  unchanged.  I do not plan to do any further workup at this time and have  discharged her from further scheduled followup unless she should develop  any new problems.   Balinda Quails, M.D.  Electronically Signed   PGH/MEDQ  D:  09/28/2008  T:  09/29/2008  Job:  [id number]   cc:   Rosalyn Gess. Norins, MD  Barry Dienes. Eloise Harman, M.D.  Veverly Fells. Excell Seltzer, MD

## 2010-09-13 NOTE — Consult Note (Signed)
[name], [name]              ACCOUNT NO.:  [id number]   MEDICAL RECORD NO.:  [id number]          PATIENT TYPE:  INP   LOCATION:  3734                         FACILITY:  [facility]   PHYSICIAN:  Kerin Perna, M.D.  DATE OF BIRTH:  [birth year]   DATE OF CONSULTATION:  09/23/2005  DATE OF DISCHARGE:                                   CONSULTATION   PRIMARY CARE PHYSICIAN:  Dr. Debby Bud   REASON FOR CONSULTATION:  Left main stenosis with progressive unstable  angina.   CHIEF COMPLAINT:  Dizziness, chest pain, shortness of breath, decreased  exercise tolerance.   HISTORY OF PRESENT ILLNESS:  I was asked to evaluate this 77 year old white  female patient for potential surgical coronary revascularization for  recently diagnosed significant left main stenosis. The patient was admitted  to the hospital on Sep 20, 2005 for a diagnosis of unstable angina. Her  cardiac enzymes were negative and she was placed on IV nitroglycerin,  heparin and a beta blocker, aspirin and a Statin. She had a preceding  several-week history of exertional chest pain relieved by rest and  nitroglycerin. She also had associated increasing dyspnea on exertion,  decreasing exercise tolerance and easy fatigue and dizziness which was at  times more orthostatic than exertional.  She remained stable over the  holiday weekend and underwent diagnostic catheterization today by Dr.  Tedra Senegal.  A CT scan of the chest was done due to a potential right lower lobe  pulmonary nodule however this showed no pulmonary nodule and no aortic  pathology as well.  She was found to have an ostial left main stenosis of 50-  60% with a stenosis at the origin of the circumflex as well. A dominant  right coronary artery was non-diseased. Her ejection fraction was 60% and  left ventricular end diastolic pressure was 23 mmHg. There is no evidence of  significant mitral regurgitation or aortic insufficiency. Based on her  coronary symptoms and  coronary anatomy and left main stenosis she was felt  to be a potential candidate for coronary revascularization.   PAST MEDICAL HISTORY:  1.  Hypertension.  2.  Hyperlipidemia.  3.  Positive family history for coronary disease.  4.  Pernicious anemia.  5.  Hypothyroidism.  6.  Seasonal allergies.  7.  History of kidney stones.  8.  Atrophic gastritis.   PAST SURGICAL HISTORY:  Status post hysterectomy, thyroid biopsy, and  alveoloplasty augmentation of the left maxillary gum (this was performed on  Sep 09, 2005).   ALLERGIES:  SULFA.   HOME MEDICATIONS:  1.  Pravachol 20 mg daily.  2.  Benicar-HCTZ 40/25 daily.  3.  Synthroid 50 mcg daily.  4.  Norvasc 5 mg daily.  5.  Toprol XL 25 mg.   SOCIAL HISTORY:  She is married and lives in [place] with her husband. She  is retired from office work. She has no alcohol or tobacco history.   FAMILY HISTORY:  Mother died at age 32 of an MI. Her father had lung cancer.  Her husband had coronary artery bypass surgery in [facility] at [facility].   REVIEW OF SYSTEMS:  Overall a constitutional review is positive for mild  recent weight loss related to her gum surgery and poor oral intake. ENT  review is negative for difficulty swallowing. The gum surgery is healing  adequately. Thoracic review is negative for thoracic trauma or recent  productive cough or URI. Cardiac review is positive for symptoms of unstable  angina with left main stenosis.  No history of cardiac murmur or prior  cardiac disease. GI review is positive for atrophic gastritis and pernicious  anemia. Negative for GI bleeding, hepatitis or jaundice. Urologic is  positive for kidney stones. Neurologic review is negative for stroke or  seizure.  Vascular review is negative for deep venous thrombosis,  claudication or TIA. Her medical record indicates she does have a history of  femoral bruit.  Endocrine review is negative for diabetes, positive for  hypothyroidism.   Neurologic review is negative for stroke or seizure.   PHYSICAL EXAMINATION:  VITAL SIGNS:  The patient is 5 feet, 5 inches and  weights 118-120 pounds. Blood pressure 150/70, pulse 80, respirations 18.  GENERAL APPEARANCE:  Is that of a pleasant, well kept white female in no  distress.  HEENT EXAM:  Reveals her to be normocephalic. EOMs full. Pharynx clear.  Dentition good.  NECK:  Without JVD, mass or thyromegaly. She has soft bilateral carotid  bruits.  CARDIAC EXAM:  Reveals a regular rate and rhythm without S3 gallop. There is  a soft 1-6 holosystolic murmur.  LUNGS:  Clear and there is no thoracic deformity.  ABDOMEN:  Nontender without pulsatile mass, no organomegaly.  EXTREMITIES:  Reveal no clubbing, cyanosis or edema. Peripheral pulses are  2+. She has soft bilateral femoral bruits. There is no evidence of venous  insufficiency of the lower extremities.  NEUROLOGIC EXAM:  Intact. She is alert and oriented.   LABORATORY DATA:  I reviewed the coronary arteriograms with Dr. Tedra Senegal and I  feel that she has a significant left main stenosis and preserved LV systolic  function.   PLAN:  The patient would benefit from surgical revascularization with bypass  grafts to the LAD and circumflex marginal and possibly the ramus  intermediate or diagonal. I have discussed the details of surgery with the  patient  and her husband and at this point she is willing to proceed with surgery.  The surgery will be scheduled for later in the week at the first available  operative date.   Thank you for the consultation.      Kerin Perna, M.D.  Electronically Signed     PV/MEDQ  D:  09/23/2005  T:  09/23/2005  Job:  [id number]   cc:   Rosalyn Gess. Norins, M.D. [facility]  [address]  [place]  [place] [address]

## 2010-09-13 NOTE — Op Note (Signed)
NAMEJANNELL, [name]              ACCOUNT NO.:  [id number]   MEDICAL RECORD NO.:  [id number]          PATIENT TYPE:  INP   LOCATION:  2307                         FACILITY:  [facility]   PHYSICIAN:  Kerin Perna, M.D.  DATE OF BIRTH:  1933/03/05   DATE OF PROCEDURE:  09/25/2005  DATE OF DISCHARGE:                                 OPERATIVE REPORT   OPERATION:  Coronary bypass grafting x2 (left internal mammary artery to  LAD, saphenous vein graft to obtuse marginal).   PREOPERATIVE DIAGNOSIS:  Left main stenosis with unstable angina.   POSTOPERATIVE DIAGNOSIS:  Left main stenosis with unstable angina.   SURGEON:  Kerin Perna, M.D.   ASSISTANTS:  1.  Salvatore Decent Dorris Fetch, M.D.  2.  Coral Ceo, PA-C.   ANESTHESIA:  General by Dr. Sharee Holster   INDICATIONS:  The patient is a 77 year old female with history of chest  pain, weakness, dizziness and a stress test which was not diagnostic for  myocardial ischemia.  She underwent cardiac catheterization by Dr. Riley Kill.  This demonstrated a stenosis of the left main as well as the ostium of the  circumflex.  It was felt to be consistent with causing ischemia to represent  her symptoms.  A cardiothoracic surgical consultation was requested and I  reviewed the patient's cardiac catheterization and discussed the results of  that study with the patient and her family.  I agree with Dr. Riley Kill and  his associates that surgical revascularization would be indicated based on  her coronary anatomy and her coronary disease and critical locations.  I  reviewed the indications and major aspects of heart bypass surgery with the  patient and her family.  I discussed the alternatives to surgical therapy as  well.  I reviewed with them the risks to her of coronary bypass surgery  including risks of MI, CVA, bleeding, infection, and death.  She understood  these implications for the surgery and agreed to proceed with operation as  planned  under what I felt was an informed consent.   OPERATIVE FINDINGS:  The patient's saphenous vein was harvested  endoscopically from the right leg.  The internal mammary artery was small  but had good flow.  The coronaries were very small.  The circumflex marginal  was a 1 mm vessel.  The mammary artery was a 1 mm vessel.  The major and  dominant vessel on the anterior wall of the left ventricle was the diagonal  branch to LAD and the mammary artery was grafted to that vessel at its  takeoff from the LAD.  The patient required 2 units of packed cells while on  bypass for a hemoglobin of 7 g.  The patient was given the aprotinin  protocol at half-dose because of her exposure to heparin and risk for  bleeding.  Her preoperative renal function was acceptable for the aprotinin.   PROCEDURE:  The patient was brought to the operating room and placed supine  on the operating table.  General anesthesia was induced under invasive  hemodynamic monitoring.  The chest, abdomen and legs were prepped with  Betadine and draped as a sterile field.  A sternal incision was made as the  saphenous vein was harvested endoscopically from the right leg.  The  internal mammary artery was harvested as a pedicle graft from its origin at  subclavian vessels.  The sternal retractor was placed and the pericardium  was opened and suspended.  Heparin was administered and the ACT was  documented as being therapeutic.  Pursestrings were placed in the ascending  aorta and right atrium and the patient was cannulated and placed on bypass.  The coronaries were identified for grafting.  The mammary artery and vein  grafts were prepared for the distal anastomoses.  A cardioplegia catheter  was placed in the ascending aorta.  The patient was cooled to 32 degrees and  aortic crossclamp was applied.  There was 500 mL of cold blood cardioplegia  delivered to the aortic root with good cardioplegic arrest.  There was  evidence of some  mild aortic insufficiency which was dealt with by manually  compressing the left ventricle to direct the cardioplegia through the  coronaries.  It should be noted that an aortogram preoperatively did not  show significant aortic insufficiency.  After good cardioplegic arrest had  been achieved, the distal coronary anastomoses were performed.   The first distal anastomosis was the circumflex marginal.  This had a very  calcified plaque proximally.  It was a small 1 mm vessel.  A reverse  saphenous vein was sewn end-to-side with running 8-0 Prolene.  There was  adequate flow through the graft.  The second distal anastomosis was to the  diagonal branch of the LAD very close to its takeoff from the LAD.  It was a  1.4 mm vessel.  The left internal mammary artery pedicle was brought through  an opening created in the left lateral pericardium was brought down on the  LAD and sewn end-to-side with running 8-0 Prolene.  There was good flow  through the anastomosis after briefly releasing the pedicle bulldog on the  mammary pedicle.  The pedicle was secured to the epicardium and cardioplegia  was redosed.   While crossclamp was still in place, the proximal anastomosis was performed  on the aorta using a 4 mm punch and running 6-0 Prolene.  Prior to tying  down the proximal anastomosis, air was vented from the coronaries and the  left side of the heart.   The crossclamp was then removed and the air was aspirated from the vein  graft with a 27-gauge needle.  The heart was cardioverted back to a regular  rhythm.  The patient was rewarmed and reperfused.  The proximal and distal  anastomoses were checked and found to be hemostatic.  When the patient  reached 37 degrees and after pacing wires had been applied, the lungs re-  expanded and the ventilator was resumed.  The patient was then weaned off bypass on low-dose dopamine.  Blood pressure and cardiac output were stable.  Protamine was administered  without adverse reaction.  The cannula was  removed and mediastinum was irrigated warm antibiotic irrigation.  Leg  incision was irrigated and closed in a standard fashion.  The superior  pericardial fat was closed over the aorta.  Two mediastinal and a left  pleural chest tube were placed and brought out through separate incisions.  The sternum was closed with interrupted steel wire.  The pectoralis fascia  was closed with running #1 Vicryl.  Subcutaneous and skin layers were closed  with  a running Vicryl and sterile dressings were applied.  Total bypass time  was 85 minutes with crossclamp time of 46 minutes.      Kerin Perna, M.D.  Electronically Signed     PV/MEDQ  D:  09/25/2005  T:  09/26/2005  Job:  [id number]   cc:   [facility] office   [facility]

## 2010-09-13 NOTE — Cardiovascular Report (Signed)
[name], [name]              ACCOUNT NO.:  [id number]   MEDICAL RECORD NO.:  [id number]          PATIENT TYPE:  INP   LOCATION:  3734                         FACILITY:  [facility]   PHYSICIAN:  Arturo Morton. Riley Kill, M.D. [facility] OF BIRTH:  08/20/1932   DATE OF PROCEDURE:  09/23/2005  DATE OF DISCHARGE:                              CARDIAC CATHETERIZATION   INDICATIONS:  [name] is a 77 year old who has had some recurrent chest  discomfort.  Her radionuclide imaging study was negative for significant  ischemia.  However, she had onset of typical angina during exercise.  This  was last done in October, although the symptoms have been slightly worse.  The current study is done to assess coronary anatomy.   PROCEDURE:  1.  Left heart catheterization.  2.  Selective coronary arteriography.  3.  Selective left ventriculography.  4.  Aortic root aortography.   DESCRIPTION OF PROCEDURE:  The patient was brought to the catheterization  laboratory, prepped and draped in usual fashion. Through an anterior  puncture, the femoral artery was entered. A 6-French sheath was placed.  With a diagnostic left 4 catheter, there was damping in the left main.  We  then backed out, and we selected a JL-3.5 diagnostic catheter.  Prior to  this, views of the right coronary were obtained with the diagnostic 3.5  catheter; there was some very slight damping, but we were able to do some  injections.  On top of this, we went ahead and we used a JL-4 guiding  catheter to try to take nonselective shots just outside the left main to  better visualize this area.  In addition, central aortic and left  ventricular pressures were measured with the pigtail.  We did a proximal  aortic root shot to better lay out the left main as well and also to assess  for aortic regurgitation since some was seen during the guiding catheter  injections.  There were no complications. I reviewed the findings with the  patient's  husband, and the patient was taken to the holding area in  satisfactory clinical condition.   HEMODYNAMIC DATA.:  1.  Central aortic pressure 159/64, mean 105.  2.  Left ventricular pressure 168/23.  3.  No gradient on pullback across aortic valve.   ANGIOGRAPHIC DATA.:  1.  Ventriculography was done in the RAO projection.  Overall systolic      function was vigorous without definite wall motion abnormalities.  There      was some trace mitral regurgitation.  2.  Aortic root aortography reveals trace aortic regurgitation.  Aortic root      does not appear to be dilated.  3.  There is clearly some ostial narrowing that involves the left main.  The      exact severity of this is uncertain.  I had estimated it around 50%.      There is mid stenosis of about 40% and calcification in the mid portion      of the left anterior descending artery. This likewise does not appear to      be critical. The  LAD bifurcates distally into a large diagonal and LAD.      The ostium of the circumflex has about 40% narrowing, and this is also      associated with a small intermediate branch that has about 70% ostial      narrowing.  There is mild luminal irregularity in the distal circumflex.   CONCLUSION:  1.  Preserved overall left ventricular function.  2.  No evidence of aortic dissection or significant aortic regurgitation by      contrast aortography.  3.  No significant stenosis in the right coronary artery.  4.  50% ostial left main stenosis.   I suspect we will need to review this with the cardiovascular surgeons.  She  is a reasonable surgical candidate.  I will review this with my colleagues  as well.      Arturo Morton. Riley Kill, M.D. [facility]  Electronically Signed     TDS/MEDQ  D:  09/23/2005  T:  09/23/2005  Job:  [id number]   cc:   Arturo Morton. Riley Kill, M.D. [facility]  [address]  [address]  [place]  [place] [address]   Cardiovascular Laboratory   Arvilla Meres, M.D. [facility]  [facility]   [address]  [place]  [place] [address]   Rosalyn Gess. Norins, M.D. [facility]  [address]  [place]  [place] [address]

## 2010-09-13 NOTE — Discharge Summary (Signed)
[name], [name]              ACCOUNT NO.:  [id number]   MEDICAL RECORD NO.:  [id number]          PATIENT TYPE:  INP   LOCATION:  2025                         FACILITY:  [facility]   PHYSICIAN:  Kerin Perna, M.D.  DATE OF BIRTH:  1933/04/07   DATE OF ADMISSION:  09/20/2005  DATE OF DISCHARGE:  10/02/2005                                 DISCHARGE SUMMARY   CARDIOLOGIST:  Dr. Arvilla Meres.   ADMISSION DIAGNOSIS:  Recurrent chest discomfort.   DISCHARGE DIAGNOSES:  1.  Coronary artery disease with left main stenosis with unstable angina,      status post coronary artery bypass graft x2.  2.  Hypertension.  3.  Postoperative atrial fibrillation.  4.  Hyperlipidemia.  5.  Positive family history for coronary artery disease.  6.  Pernicious anemia.  7.  Hypothyroidism.  8.  Seasonal allergies.  9.  History of kidney stones.  10. Atrophic gastritis.   CONSULTATIONS:  1.  On Sep 20, 2005, Dr. Gala Romney for cardiology was consulted.  2.  On Sep 23, 2005, Dr. Kathlee Nations Trigt was consulted for cardiovascular      thoracic surgery.   PROCEDURES:  1.  On May 92, 2007, the patient underwent cardiac catheterization by Dr.      Arturo Morton. Stuckey.  2.  On Sep 25, 2005, the patient underwent coronary artery bypass grafting      x2.  Left internal mammary artery to the LAD, saphenous vein graft to      the obtuse marginal (by Dr. Kathlee Nations Trigt).   HISTORY AND PHYSICAL:  This is 77 year old white female patient for  potential surgical revascularization for recently diagnosed significant left  main stenosis.  The patient was admitted to the hospital on Sep 20, 2005 for  diagnosis of unstable angina.  Her cardiac enzymes were negative and she was  placed on IV nitroglycerin, heparin and a beta blocker, aspirin and a  statin.  The patient had received several week history of exertional chest  pain relieved by rest and nitroglycerin.  She also had associated increasing  dyspnea on  exertion, decrease exercise tolerance and easy fatigue and  dizziness which at times was more orthostatic than exertional.  She remained  stable for the holiday weekend and underwent diagnostic cardiac  catheterization evaluated by Dr. Riley Kill.  A CT scan of the chest was done  due to a potential right lower lobe pulmonary nodule; however, this showed  no pulmonary nodule and no aortic pathology as well.  She was found to have  an ostial left main stenosis of 50-60% with stenosis at the origin of the  circumflex as well.  A dominant right coronary artery with mild disease.  Her ejection fraction was 60%.  Left ventricular end-diastolic pressure was  23 mmHg.  There is no evidence of significant mitral regurgitation or aortic  insufficiency.  Based on her coronary symptoms and coronary anatomy and left  main stenosis, she was felt to be a potential candidate for coronary  revascularization.   As the patient was awaiting surgery, the patient remained chest pain  free  and stable.   HOSPITAL COURSE:  On postoperative day #1, the patient was doing well and  her vital signs were stable.  The patient was being paced for a slow heart  rate.  She was hemodynamically stable.   On postoperative day #2, the patient went into atrial fibrillation.  The  patient was started on amiodarone and she had converted into normal sinus  rhythm. The patient remained in normal sinus rhythm until postoperative day  #6 when she went back into atrial fibrillation at a rate controlled rate of  81 beats per minute.  It was best thought that the patient will then begin  Coumadin.   The patient did have a left pleural effusion on chest x-ray.  She was  receiving Lasix.  The patient went for a left thoracentesis on October 01, 2005  and 650 mL of bloody fluid was removed.   The patient did have some volume overload.  After surgery, she was treated  with Lasix and potassium.  This had resolved at the time of discharge.   The  patient is ambulating well with a steady gait on her own.  Her blood  pressure and heart rate remained stable.  The patient is continuing her  breathing exercises.   PHYSICAL EXAMINATION:  VITAL SIGNS:  Blood pressure 126/60, heart rate 68,  respirations 18, temperature 98.2, O2 saturation 94% on room air.  I&O  negative and 900.  Weight 252.4 kg (preoperative weight was 54 kg).  On  telemetry, the patient's atrial fibrillation is 81 beats per minute.  She is  in and out of normal sinus rhythm.  CARDIOVASCULAR:  Atrial fibrillation and regular rate and regular rhythm.  RESPIRATORY:  Clear to auscultation.  ABDOMEN:  Benign.  EXTREMITIES:  1+ pitting edema right lower extremity.  Left lower extremity  trace of edema.  SKIN:  Incision is clear, dry and intact.   CONDITION ON DISCHARGE:  Good.   DISPOSITION:  The patient will be discharged to home.   ALLERGIES:  The patient is allergic to SULFA reaction swelling and rash.  The patient has a MILK intolerance.   MEDICATIONS:  1.  Aspirin 325 mg p.o. daily.  2.  Toprol XL 25 mg p.o. daily.  3.  Pravachol 20 mg p.o. daily.  4.  Synthroid 0.5 mg p.o. daily.  5.  Benicar/HCTZ 40/25 mg p.o. daily.  6.  Amiodarone 400 mg b.i.d. x12 days and then 400 mg p.o. daily for 14 days      and then 200 mg daily for 3 months.  7.  Ultram 1-2 tablets every 6 hours as needed.  8.  The patient will begin Coumadin prior to discharge.  Her PT/INR will be      monitored by [facility].  The patient's dose will be      arranged at the time of discharge.   DISCHARGE INSTRUCTIONS:  The patient is instructed to follow a low-fat, low-  salt diet.  She was to continue her breathing exercise.  The patient is to  ambulate three to four times daily and increase activity as tolerated.  The  patient is to do no driving or heavy lifting greater than 10 pounds for three weeks.  The patient may shower and clean her wounds with mild soap and  water.   She is to call the office if she experiences any incision problems  such as erythema, drainage, temperature greater than 101.5.   FOLLOW UP:  The patient has a follow-up appointment with Dr. Donata Clay on  October 24, 2005 at 12:30 p.m.  The patient is to call Dr. Prescott Gum office  for an appointment in two weeks.  The patient will have a PT/INR appointment  at [facility] on October 04, 2005.      Constance Holster, [place]      Kerin Perna, M.D.  Electronically Signed    JMW/MEDQ  D:  10/01/2005  T:  10/01/2005  Job:  [id number]   cc:   Patient's chart   Kerin Perna, M.D.  [address]  [place]  [place] [address]   Arvilla Meres, M.D. [facility]  [facility]  [address]  [place]  [place] [address]   Arturo Morton. Riley Kill, M.D. [facility]  [address]  [address]  [place]  [place] [address]   Rosalyn Gess. Norins, M.D. [facility]  [address]  [place]  [place] [address]

## 2010-09-13 NOTE — H&P (Signed)
NAMEJENNAVIEVE, Valdez NO.:  [id number]   MEDICAL RECORD NO.:  [id number]          PATIENT TYPE:  EMS   LOCATION:  MAJO                         FACILITY:  [facility]   PHYSICIAN:  Arvilla Meres, M.D. LHCDATE OF BIRTH:  July 26, 1932   DATE OF ADMISSION:  09/20/2005  DATE OF DISCHARGE:                                HISTORY & PHYSICAL   PRIMARY CARE PHYSICIAN:  Illene Regulus, M.D.   PRIMARY CARDIOLOGIST:  Arvilla Meres, M.D.   CHIEF COMPLAINT:  Chest pain.   HISTORY OF PRESENT ILLNESS:  [name] is a 77 year old white female with  no known history of coronary artery disease.  She has approximately a one  week history of general malaise and orthostatic dizziness.  She has a long  history of exertional chest pain described as a pressure.  Because of this,  she had an exercise Myoview in October 2006 which was without ischemia and  normal, and her EF was normal.  In general, her symptoms are exertional and  rate a 2-3/10.  They resolve with rest.   Today, [name] had an episode of chest pain that reached a 6/10 and was  associated with shortness of breath.  It was exertional but it was more  sustained than usual and the intensity was greater.  She went to Urgent Care  where she was evaluated and sent by EMS to the emergency room.  She states  that she has had some episodes of pain in the emergency room, but her  symptoms greatly improved with sublingual nitroglycerin x2.  She also notes  that she feels her heart pounding when she sits up.  Of note, her heart rate  did increase from 93 to 17 which she sat up for the exam.   PAST MEDICAL HISTORY:  1.  Status post gait exercise treadmill Myoview on October 2006 with no      ischemia and an ejection fraction of 80%.  2.  Hypertension.  3.  Hyperlipidemia.  4.  Family history of coronary artery disease.  5.  Presumed peripheral vascular disease with a history of aortic and      bilateral femoral bruits.  6.  Pernicious anemia.  7.  Hypothyroidism.  8.  Seasonal allergies.  9.  History of nephrolithiasis.  10. Atrophic gastritis with polyps.   SURGICAL HISTORY:  1.  She is status post hysterectomy.  2.  Status post thyroid biopsy.  3.  Dental surgery on Sep 09, 2005.   ALLERGIES:  SULFA.   MEDICATIONS:  1.  Vicodin (secondary to recent dental surgery).  2.  Pravachol 20 mg every day.  3.  Benicar/HCTZ 40/25 every day.  4.  Synthroid 50 mcg every day.  5.  Norvasc 5 mg every day.  6.  She was previously taking Toprol but this was discontinued secondary to      bradycardia.   SOCIAL HISTORY:  [place] lives in [place] her husband and is retired from  office work at [employer].  She has no history of alcohol, tobacco or drug  abuse.   FAMILY HISTORY:  Mother  died at age 63 of an MI with no previous history of  heart disease.  Her father died at age 75 of lung cancer without a history  of heart disease.  She has no siblings with coronary artery disease.   REVIEW OF SYSTEMS:  She has had some chills today.  She has had some  orthostatic dizziness.  She has some dyspnea on exertion which is chronic  and has not changed recently.  She has myalgias and arthralgias.  She has  occasional reflux symptoms but none today.  Review of systems is otherwise  negative.   PHYSICAL EXAMINATION:  VITAL SIGNS:  Temperature is 97.  Initial blood  pressure 185/88, initial heart rate 124 at rest, respiratory rate 18 and O2  saturation 100% on room air.  GENERAL:  She is a well-developed elderly white female in no acute distress.  HEENT:  Her head is normocephalic and atraumatic with pupils equal, round  and  reactive to light and accommodation.  Extraocular movements intact.  Sclera clear.  Nares without discharge.  NECK:  Supple and there is no lymphadenopathy, thyromegaly or JVD noted, but  she has bilateral carotid bruits.  CARDIOVASCULAR:  Heart is regular rate and rhythm with an S1-S2 and  a soft  systolic ejection murmur of 2/6 is noted.  distal pulses are 2+ with  bilateral femoral bruits as well as an abdominal bruits noted.  LUNGS:  Clear to auscultation bilaterally.  SKIN:  No rashes or lesions are noted.  ABDOMEN:  Soft and nontender with active bowel sounds.  EXTREMITIES:  There is no cyanosis, clubbing or edema.  MUSCULOSKELETAL:  There is no joint deformity or effusions and no spine or  CVA tenderness.  NEUROLOGICAL:  She is alert and oriented.  Cranial nerves II-XII grossly  intact.   LABORATORY DATA:  EKG is sinus tachycardia with less than 1 mL of lateral ST  depression.   LABORATORY VALUES:  Sodium 137, potassium 3.6. chloride 106, CO2 27, BUN 22,  creatinine 1.2, glucose 101.  BNP 65.  Point of care markers negative x1.  D-  dimer 0.61.   IMPRESSION:  1.  Chest pain:  She will be admitted and MI will be ruled out.  Will pursue      cardiac catheterization on Tuesday.  If the cardiac catheterization is      negative for coronary artery disease consideration can be given to      evaluate her for pulmonary embolus with a chest CT with contrast.  She      will be anticoagulated with heparin and IV nitroglycerin at 3 mL.  She      will be continued on her home medications, and we will restart low dose      beta blocker with parameters.  She will be continued on her home dose      Pravachol and a lipid profile will be checked in a.m.  2.  Peripheral arterial disease:  She will follow up as an outpatient with      carotid Dopplers and ABIs.  She is asymptomatic for this and no      inpatient workup is indicated.   [name] is otherwise stable and will be continued on her home  medications.      Theodore Demark, P.A. [facility]      Arvilla Meres, M.D. [facility]  Electronically Signed    RB/MEDQ  D:  09/20/2005  T:  09/20/2005  Job:  [id number]

## 2010-09-13 NOTE — Assessment & Plan Note (Signed)
[facility]                              CARDIOLOGY OFFICE NOTE   NAME:CAMPBELLAnnella, Prowell                     MRN:          [id number]  DATE:11/28/2005                            DOB:          1932/11/20    PATIENT IDENTIFICATION:  Ms. Lietz is a 77 year old woman who returns for  her post bypass follow up.   PROBLEM LIST:  1.  Coronary artery disease.      1.  Cardiac catheterization in May of 2007 revealed significant left          main disease.  She subsequently underwent LIMA to the LAD and          saphenous vein graft to the marginal by Dr. Donata Clay.  2.  Postoperative atrial fibrillation on amiodarone.  3.  Hypertension.  4.  Hyperlipidemia.  5.  Hypothyroidism.   CURRENT MEDICATIONS:  1.  Amiodarone 200 a day.  2.  Benicar/HCTZ 40/25.  3.  Coreg 12.5 b.i.d.  4.  Pravachol 20 a day.  5.  Synthroid 50 mcg a day.  6.  Aspirin 81.  7.  Carafate.  8.  Allergy shots.   ALLERGIES:  1.  SULFA.  2.  CODEINE.   INTERVAL HISTORY:  Ms. Budge returns for followup.  Overall, she is doing  quite well.  She has been going to cardiac rehab religiously and doing well.  However, while in cardiac rehab noticed that her blood pressure has been  quite labile.  She has had systolics as high as 190, but occasionally her  blood pressure is as low as 100.  She has also noticed a significant amount  of lower extremity edema over the past few days.  We changed her Lopressor  over to Coreg, which she started last week, and noticed that her blood  pressure has come down some.  She denies any significant orthopnea or PND.  She denied any palpitations.  Her weight is up a bit.   PHYSICAL EXAM:  She is well appearing, no acute distress.  Blood pressure is  138/70 manually, her heart rate is 60, her weight is 128, which is up about  9 pounds since June.  HEENT:  Sclera anicteric, EOMI, there is no xanthelasmas.  Mucous membranes  are moist.  NECK:   Supple.  JVP is elevated to about 10 cmH2O with prominent CV waves.  CARDIAC:  Regular rate and rhythm with a 2/6 tricuspid regurgitation murmur.  LUNGS:  Clear.  ABDOMEN:  Soft, nontender, nondistended, there is no hepatosplenomegaly.  Good bowel sounds.  EXTREMITIES:  Warm with no cyanosis or clubbing.  There is about 1+ edema  bilaterally.  NEURO:  She is alert and oriented x3.  Cranial nerves II-XII are intact.  She moves all 4 extremities without difficulty.  EKG shows sinus rhythm with  first degree AV block and poor R-wave progression.   ASSESSMENT AND PLAN:  1.  Coronary artery disease.  She is doing quite well after bypass without      any signs of recurrent angina.  2.  Hypertension.  This is  quite labile.  She does have some very      significant fluid overload and will add Lasix and see how she responds.      I suspect she may need either to titrate her Coreg up or add Norvasc,      depending on her heart rate and blood pressure.  3.  Tricuspid regurgitation.  She has evidence of tricuspid regurgitation on      examination.  I suspect this may be due to fluid overload, but will get      an echocardiogram to further evaluate.  4.  Hyperlipidemia.  Followed by Dr. Debby Bud.  Given her coronary artery      disease, she would need to get an LDL below 70.  5.  Postoperative atrial fibrillation.  This is quiescent, we can stop her      amiodarone.                                Bevelyn Buckles. Bensimhon, MD    DRB/MedQ  DD:  11/28/2005  DT:  11/28/2005  Job #:  [id number]   cc:   Rosalyn Gess. Norins, MD

## 2010-09-13 NOTE — Assessment & Plan Note (Signed)
[facility]                              CARDIOLOGY OFFICE NOTE   NAME:CAMPBELLNolan, Lasser                     MRN:          [id number]  DATE:01/27/2006                            DOB:          [birth year]    PRIMARY CARE PHYSICIAN:  Rosalyn Gess. Norins, MD   PATIENT IDENTIFICATION:  [name] is a 77 year old woman who returns for  routine follow-up.   PROBLEM LIST:  1. Coronary artery disease with left main disease.      a.     Status post CABG with a LIMA to the LAD and saphenous vein graft       to the marginal by Dr. Donata Clay May 2007.  2. Postoperative atrial fibrillation, treated with short course of      amiodarone, normal LV function.  3. Hypertension.  4. Hyperlipidemia.  5. Hypothyroidism.  6. History of carotid artery stenosis, which was evaluated extensively in      the hospital prior to her bypass.  Thought to be nonobstructive.   CURRENT MEDICATIONS:  1. Benicar/HCT 40/25 mg.  2. Coreg 12.5 mg b.i.d.  3. Pravachol 20 mg a day.  4. Synthroid 50 mcg a day.  5. Aspirin 81 mg a day.  6. Lasix 20 mg a day.  7. Potassium 20 mEq a day.  8. Ocuvite.   ALLERGIES:  SULFA, CODEINE.   INTERVAL HISTORY:  Ms. Strader returns today for routine follow-up.  She is  doing quite well.  She just graduated cardiac rehab.  She denies any chest  pain.  She does have occasional minimal shortness of breath with activity.  Denies any palpitations, orthopnea or PND.  They have been watching her  blood pressure in cardiac rehab and it has been much improved.  Systolics,  however, have been running from 82 to 160 but overall about 120.   PHYSICAL EXAMINATION:  GENERAL:  She is in no acute distress.  She ambulates  around the clinic without any difficulty.  VITAL SIGNS:  Blood pressure manually is 144/54, heart rate is 55, weight is  118.  HEENT:  Sclerae anicteric, EOMI.  There is no xanthelasma.  Mucous membranes  are moist.  NECK:  Supple.   JVP is 5-6 cm.  Carotids are 2+ bilaterally.  There is a  soft left bruit.  No lymphadenopathy or thyromegaly.  CARDIAC:  Bradycardic and regular.  No murmurs, rubs or gallops.  LUNGS:  Clear.  ABDOMEN:  Soft, nontender, nondistended.  No hepatosplenomegaly.  No bruits.  No masses.  EXTREMITIES:  Warm with no cyanosis, clubbing or edema.   ASSESSMENT AND PLAN:  1. Coronary artery disease.  She is status post bypass surgery.  She is      doing quite well.  Continue current regimen.  2. Hypertension.  Fairly good control though this is somewhat labile.      Continue current regimen.  She will follow up with Dr. Debby Bud.  3. Hyperlipidemia.  Continue Pravachol.  Follow up with Dr. Debby Bud.  4. Carotid artery stenosis.  She will need a yearly ultrasound.   DISPOSITION:  Return to clinic for routine follow-up in 9 months.       Bevelyn Buckles. Bensimhon, MD     DRB/MedQ  DD:  01/27/2006  DT:  01/28/2006  Job #:  [id number]   cc:   Rosalyn Gess. Norins, MD

## 2010-09-17 ENCOUNTER — Encounter: Payer: Self-pay | Admitting: Internal Medicine

## 2010-09-20 ENCOUNTER — Encounter: Payer: Self-pay | Admitting: Internal Medicine

## 2010-09-20 ENCOUNTER — Ambulatory Visit (INDEPENDENT_AMBULATORY_CARE_PROVIDER_SITE_OTHER): Payer: Medicare Other | Admitting: Internal Medicine

## 2010-09-20 VITALS — BP 144/58 | HR 62 | Ht 64.0 in | Wt 107.0 lb

## 2010-09-20 DIAGNOSIS — I6529 Occlusion and stenosis of unspecified carotid artery: Secondary | ICD-10-CM

## 2010-09-20 DIAGNOSIS — K432 Incisional hernia without obstruction or gangrene: Secondary | ICD-10-CM

## 2010-09-20 DIAGNOSIS — I2510 Atherosclerotic heart disease of native coronary artery without angina pectoris: Secondary | ICD-10-CM

## 2010-09-20 DIAGNOSIS — I10 Essential (primary) hypertension: Secondary | ICD-10-CM

## 2010-09-20 DIAGNOSIS — R011 Cardiac murmur, unspecified: Secondary | ICD-10-CM

## 2010-09-20 NOTE — Assessment & Plan Note (Signed)
Doing well. No signiifcant angina. Continue current regimen.

## 2010-09-20 NOTE — Assessment & Plan Note (Signed)
Stable. Asymptomatic. Due for f/u u/s in September. Continue statin.

## 2010-09-20 NOTE — Assessment & Plan Note (Signed)
BP mildly elevated here but controlled at home. Continue current regimen.

## 2010-09-20 NOTE — Progress Notes (Signed)
HPI:  [name] is 77 year old woman with a history of hypertension as well as coronary artery disease, status post two-vessel bypass in May 2007.  She also has hyperlipidemia, hypothyroidism, moderate carotid artery stenosis and weight loss.   Previously followed with Dr. Madilyn Valdez for median arcuate ligament syndrome who feels that this is not responsible for her weight loss.    Carotid stenosis 40-59% bilaterally (stable) - on u/s 9/11.  She returns for routine f/u.   From a cardiac standpoint doing fairly well. Denies CP or shortness of breath.  Has been trying to continue with her walking program but struggling with sciatica.  Continues to feel fatigued. At last visit cut coreg back from  12.5 bid to 6.25 bid due to HRs in 40-50s. No syncope or presyncope.  Weight loss has leveled off.  No neuro sx. Continues to struggle with indigestion.     ROS: All systems negative except as listed in HPI, PMH and Problem List.  Past Medical History  Diagnosis Date  . Renal calculus   . PVD (peripheral vascular disease)   . Anemia, pernicious   . Atrophic gastritis   . Hypothyroidism   . HLD (hyperlipidemia)   . HTN (hypertension)   . CAD (coronary artery disease)   . Carotid artery stenosis   . Median arcuate ligament syndrome     Current Outpatient Prescriptions  Medication Sig Dispense Refill  . amLODipine (NORVASC) 10 MG tablet Take 10 mg by mouth daily.        Marland Kitchen aspirin 81 MG tablet Take 81 mg by mouth daily.        Marland Kitchen atorvastatin (LIPITOR) 80 MG tablet Take 80 mg by mouth daily.        . Calcium Citrate-Vitamin D (CITRACAL + D PO) Take by mouth daily.        . carvedilol (COREG) 12.5 MG tablet Take 0.5 tablets (6.25 mg total) by mouth 2 (two) times daily.  30 tablet  5  . cyanocobalamin (,VITAMIN B-12,) 1000 MCG/ML injection Inject 1,000 mcg into the muscle every 30 (thirty) days.        . fluticasone (FLONASE) 50 MCG/ACT nasal spray 2 sprays by Nasal route daily as needed.       .  furosemide (LASIX) 40 MG tablet Take 40 mg by mouth daily.        Marland Kitchen levothyroxine (SYNTHROID, LEVOTHROID) 75 MCG tablet Take 75 mcg by mouth daily.        . Multiple Vitamins-Minerals (ICAPS) CAPS Take by mouth daily.        Marland Kitchen olmesartan (BENICAR) 40 MG tablet Take 40 mg by mouth daily.        . pioglitazone (ACTOS) 30 MG tablet Take 30 mg by mouth daily.        . sodium chloride (OCEAN) 0.65 % nasal spray 1 spray by Nasal route as needed.        . sucralfate (CARAFATE) 1 G tablet Take 1 g by mouth as needed.        . ONE TOUCH ULTRA TEST test strip as directed.      Marland Kitchen DISCONTD: etodolac (LODINE) 400 MG tablet Take 400 mg by mouth 2 (two) times daily.        Marland Kitchen DISCONTD: HYDROcodone-acetaminophen (LORTAB) 7.5-500 MG per tablet Take 1 tablet by mouth every 6 (six) hours as needed.        Marland Kitchen DISCONTD: saccharomyces boulardii (FLORASTOR) 250 MG capsule Take 250 mg by mouth daily.  PHYSICAL EXAM: Filed Vitals:   09/20/10 1447  BP: 144/58  Pulse: 62   General:  Thin Well appearing. No resp difficulty HEENT: normal Neck: supple. JVP flat. Carotids 2+ bilaterally; + bilat bruits. No lymphadenopathy or thryomegaly appreciated. Cor: PMI normal. Regular rate & rhythm. No rubs, gallops. 2/6 systolic murmur RSB. Crisp s2 Lungs: clear Abdomen: soft, nontender, nondistended. No hepatosplenomegaly. No bruits or masses. Good bowel sounds.  Small probable incisional hernia at bottom of CABG scar. Reducible but mildly tender Extremities: no cyanosis, clubbing, rash, edema Neuro: alert & orientedx3, cranial nerves grossly intact. Moves all 4 extremities w/o difficulty. Affect pleasant.    ECG: NSR 62 TWI v1 & v2. ST scooping throughout.   ASSESSMENT & PLAN:

## 2010-09-20 NOTE — Assessment & Plan Note (Signed)
Mild reducible hernia at bottom of CABG scar. Will refer to [facility] to evaluate to reassure her.

## 2010-09-20 NOTE — Assessment & Plan Note (Signed)
Echo to check mild AS murmur.

## 2010-09-20 NOTE — Patient Instructions (Addendum)
You have been referred to [facility] for incisional hernia Your physician has requested that you have an echocardiogram. Echocardiography is a painless test that uses sound waves to create images of your heart. It provides your doctor with information about the size and shape of your heart and how well your heart's chambers and valves are working. This procedure takes approximately one hour. There are no restrictions for this procedure.  NEEDS IN [facility] Your physician has requested that you have a carotid duplex. This test is an ultrasound of the carotid arteries in your neck. It looks at blood flow through these arteries that supply the brain with blood. Allow one hour for this exam. There are no restrictions or special instructions.  NEEDS IN [place] Your physician wants you to follow-up in: 1 year.  You will receive a reminder letter in the mail two months in advance. If you don't receive a letter, please call our office to schedule the follow-up appointment.

## 2010-09-25 ENCOUNTER — Ambulatory Visit: Payer: Medicare Other | Attending: Internal Medicine | Admitting: Physical Therapy

## 2010-09-25 DIAGNOSIS — M545 Low back pain, unspecified: Secondary | ICD-10-CM | POA: Insufficient documentation

## 2010-09-25 DIAGNOSIS — IMO0001 Reserved for inherently not codable concepts without codable children: Secondary | ICD-10-CM | POA: Insufficient documentation

## 2010-09-25 DIAGNOSIS — M6281 Muscle weakness (generalized): Secondary | ICD-10-CM | POA: Insufficient documentation

## 2010-09-25 DIAGNOSIS — M25569 Pain in unspecified knee: Secondary | ICD-10-CM | POA: Insufficient documentation

## 2010-09-27 ENCOUNTER — Ambulatory Visit: Payer: Medicare Other | Attending: Internal Medicine | Admitting: Physical Therapy

## 2010-09-27 DIAGNOSIS — IMO0001 Reserved for inherently not codable concepts without codable children: Secondary | ICD-10-CM | POA: Insufficient documentation

## 2010-09-27 DIAGNOSIS — M6281 Muscle weakness (generalized): Secondary | ICD-10-CM | POA: Insufficient documentation

## 2010-09-27 DIAGNOSIS — M545 Low back pain, unspecified: Secondary | ICD-10-CM | POA: Insufficient documentation

## 2010-09-27 DIAGNOSIS — M25569 Pain in unspecified knee: Secondary | ICD-10-CM | POA: Insufficient documentation

## 2010-10-01 ENCOUNTER — Ambulatory Visit: Payer: Medicare Other | Admitting: Physical Therapy

## 2010-10-04 ENCOUNTER — Ambulatory Visit: Payer: Medicare Other | Admitting: Physical Therapy

## 2010-10-04 ENCOUNTER — Other Ambulatory Visit: Payer: Self-pay | Admitting: *Deleted

## 2010-10-04 MED ORDER — AMLODIPINE BESYLATE 10 MG PO TABS: 10.0000 mg | ORAL_TABLET | Freq: Every day | ORAL | Status: DC

## 2010-10-09 ENCOUNTER — Ambulatory Visit: Payer: Medicare Other | Admitting: Rehabilitation

## 2010-10-16 ENCOUNTER — Ambulatory Visit: Payer: Medicare Other | Admitting: Physical Therapy

## 2010-10-23 ENCOUNTER — Ambulatory Visit: Payer: Medicare Other | Admitting: Physical Therapy

## 2010-10-25 ENCOUNTER — Ambulatory Visit: Payer: Medicare Other | Admitting: Physical Therapy

## 2010-10-28 ENCOUNTER — Other Ambulatory Visit (INDEPENDENT_AMBULATORY_CARE_PROVIDER_SITE_OTHER): Payer: Medicare Other

## 2010-10-28 ENCOUNTER — Ambulatory Visit: Payer: Medicare Other | Attending: Internal Medicine | Admitting: Physical Therapy

## 2010-10-28 ENCOUNTER — Ambulatory Visit (INDEPENDENT_AMBULATORY_CARE_PROVIDER_SITE_OTHER): Payer: Medicare Other | Admitting: Internal Medicine

## 2010-10-28 ENCOUNTER — Encounter: Payer: Self-pay | Admitting: Internal Medicine

## 2010-10-28 DIAGNOSIS — M545 Low back pain, unspecified: Secondary | ICD-10-CM | POA: Insufficient documentation

## 2010-10-28 DIAGNOSIS — E785 Hyperlipidemia, unspecified: Secondary | ICD-10-CM

## 2010-10-28 DIAGNOSIS — M6281 Muscle weakness (generalized): Secondary | ICD-10-CM | POA: Insufficient documentation

## 2010-10-28 DIAGNOSIS — IMO0001 Reserved for inherently not codable concepts without codable children: Secondary | ICD-10-CM | POA: Insufficient documentation

## 2010-10-28 DIAGNOSIS — I10 Essential (primary) hypertension: Secondary | ICD-10-CM

## 2010-10-28 DIAGNOSIS — R29898 Other symptoms and signs involving the musculoskeletal system: Secondary | ICD-10-CM

## 2010-10-28 DIAGNOSIS — M25569 Pain in unspecified knee: Secondary | ICD-10-CM | POA: Insufficient documentation

## 2010-10-28 LAB — LIPID PANEL
Cholesterol: 162 mg/dL (ref 0–200)
HDL: 85.2 mg/dL (ref >39.00)
LDL Cholesterol: 60 mg/dL (ref 0–99)
Total CHOL/HDL Ratio: 2
Triglycerides: 83 mg/dL (ref 0.0–149.0)
VLDL: 16.6 mg/dL (ref 0.0–40.0)

## 2010-10-28 LAB — CK: Total CK: 168 U/L (ref 7–177)

## 2010-10-28 LAB — HEPATIC FUNCTION PANEL
ALT: 20 U/L (ref 0–35)
AST: 30 U/L (ref 0–37)
Albumin: 4.1 g/dL (ref 3.5–5.2)
Alkaline Phosphatase: 35 U/L — ABNORMAL LOW (ref 39–117)
Bilirubin, Direct: 0.1 mg/dL (ref 0.0–0.3)
Total Bilirubin: 0.5 mg/dL (ref 0.3–1.2)
Total Protein: 7 g/dL (ref 6.0–8.3)

## 2010-10-29 ENCOUNTER — Telehealth: Payer: Self-pay | Admitting: Internal Medicine

## 2010-10-29 ENCOUNTER — Encounter: Payer: Self-pay | Admitting: Internal Medicine

## 2010-10-29 LAB — ANTI-NUCLEAR AB-TITER (ANA TITER): ANA Titer 1: 1:160 {titer} — ABNORMAL HIGH

## 2010-10-29 LAB — ANA: Anti Nuclear Antibody(ANA): POSITIVE — AB

## 2010-10-29 NOTE — Telephone Encounter (Signed)
Please call patient: liver functions are normal. CK total is 168, normal is up to 177. No evidence of muscle inflammation from lipitor - please continue.

## 2010-10-29 NOTE — Progress Notes (Signed)
  Subjective:    Patient ID: [name], female    DOB: Feb 15, 1933, 77 y.o.   MRN: [id number]  HPI [name] presents with a concern that high dose lipitor may be contributing to LE weakness and muscle tenderness in the LE. She is on medication for a h/o CAD. She denies generalized diffuse myalgias. Previous labs reviewed: normal lLFTs and well controlled lipids. She has never had CK total checked.  Past Medical History  Diagnosis Date  . Renal calculus   . PVD (peripheral vascular disease)   . Anemia, pernicious   . Atrophic gastritis   . Hypothyroidism   . HLD (hyperlipidemia)   . HTN (hypertension)   . CAD (coronary artery disease)   . Carotid artery stenosis   . Median arcuate ligament syndrome    Past Surgical History  Procedure Date  . Coronary artery bypass graft   . Appendectomy   . Hysterectomy - unknown type   . Biopsy thyroid   . Thyroid lobectomy    Family History  Problem Relation Age of Onset  . Hypertension    . Breast cancer    . Prostate cancer    . Colon polyps    . Diabetes    . Heart disease    . Kidney disease    . Heart disease Mother   . Cancer Brother     prostate  . Cancer Other     niece - breast cancer   History   Social History  . Marital Status: Married    Spouse Name: N/A    Number of Children: N/A  . Years of Education: 12   Occupational History  . retired   .     Social History Main Topics  . Smoking status: Never Smoker   . Smokeless tobacco: Never Used  . Alcohol Use: No  . Drug Use: No  . Sexually Active: Not on file   Other Topics Concern  . Not on file   Social History Narrative   [name], Married. Lives with husband. Had been a very active woman.       Review of Systems  Constitutional: Negative.   Respiratory: Negative.   Cardiovascular: Negative.   Gastrointestinal: Negative.   Musculoskeletal: Positive for myalgias.       Leg weakness - working with PT  Psychiatric/Behavioral: Negative.       Objective:   Physical Exam Vitals noted Gen'l - a very thin white woman in no distress HEENT - C&S clear Pulmonary - normal respirations, lungs CTAP Cor - RRR MSK - calves are tender to palpation, no significant edema, no joint changes knee or ankle       Assessment & Plan:  Leg pain - discussed the benefit and risks of statin therapy in a person with known CAD s/p CABG.   Plan - lab: total CK, if elevated will need to consider alternative "statin" i.e. Crestor.   Addendum: Liver functions normal. CK total is 168 (nl up to 177)

## 2010-10-29 NOTE — Telephone Encounter (Signed)
Informed pt .

## 2010-10-31 ENCOUNTER — Encounter: Payer: Self-pay | Admitting: Internal Medicine

## 2010-10-31 ENCOUNTER — Other Ambulatory Visit: Payer: Self-pay | Admitting: Internal Medicine

## 2010-10-31 ENCOUNTER — Ambulatory Visit: Payer: Medicare Other | Admitting: Physical Therapy

## 2010-10-31 DIAGNOSIS — R768 Other specified abnormal immunological findings in serum: Secondary | ICD-10-CM

## 2010-10-31 DIAGNOSIS — R5381 Other malaise: Secondary | ICD-10-CM

## 2010-11-11 ENCOUNTER — Other Ambulatory Visit: Payer: Self-pay | Admitting: *Deleted

## 2010-11-11 MED ORDER — FUROSEMIDE 40 MG PO TABS: 40.0000 mg | ORAL_TABLET | Freq: Every day | ORAL | Status: DC

## 2010-11-15 ENCOUNTER — Other Ambulatory Visit: Payer: Self-pay

## 2010-11-15 MED ORDER — OLMESARTAN MEDOXOMIL 40 MG PO TABS: 40.0000 mg | ORAL_TABLET | Freq: Every day | ORAL | Status: DC

## 2010-11-15 NOTE — Telephone Encounter (Signed)
rx sent to pharmacy

## 2010-11-22 ENCOUNTER — Telehealth: Payer: Self-pay | Admitting: Internal Medicine

## 2010-11-22 NOTE — Telephone Encounter (Signed)
Will make sure ok w/DB and call her back on MON

## 2010-11-22 NOTE — Telephone Encounter (Signed)
Per pt call, pt is having eye surgery August 7th and pt MD wants pt to stop taking aspirin 7 days prior to surgery pt wants to double check w/ Dr. Gala Romney that it is all right for pt to stop taking aspirin. Please return pt call to advise/discuss.

## 2010-11-25 ENCOUNTER — Encounter: Payer: Self-pay | Admitting: Internal Medicine

## 2010-11-25 ENCOUNTER — Telehealth: Payer: Self-pay | Admitting: *Deleted

## 2010-11-25 NOTE — Telephone Encounter (Signed)
Ok for appt wed

## 2010-11-25 NOTE — Telephone Encounter (Signed)
ok 

## 2010-11-25 NOTE — Telephone Encounter (Signed)
Spoke w/pt - she is worried about her BP. It has been elevated x 3 days readings - 182/74, 173/77, 170/63 & 174/60. She c/o some dizziness Saturday and began to monitor BP more closely. Also has dull h/a in back of head. Scheduled for open apt Wed AM and advised to call back w/any change in symptoms. She has cataract surgery on 8/7 which makes her more worried about elevation.

## 2010-11-26 NOTE — Telephone Encounter (Signed)
Pt aware.

## 2010-11-27 ENCOUNTER — Ambulatory Visit (INDEPENDENT_AMBULATORY_CARE_PROVIDER_SITE_OTHER): Payer: Medicare Other | Admitting: Internal Medicine

## 2010-11-27 VITALS — BP 168/60 | HR 65 | Temp 98.2°F | Wt 103.0 lb

## 2010-11-27 DIAGNOSIS — I10 Essential (primary) hypertension: Secondary | ICD-10-CM

## 2010-11-27 MED ORDER — DILTIAZEM HCL ER COATED BEADS 300 MG PO CP24: 300.0000 mg | ORAL_CAPSULE | Freq: Every day | ORAL | Status: DC

## 2010-11-27 NOTE — Progress Notes (Signed)
  Subjective:    Patient ID: [name], female    DOB: 16-Aug-1932, 77 y.o.   MRN: [id number]  HPI [name] has had several episodes of dizziness. Past Saturday she had prolonged dizziness and felt bad/weak. She had her BP checked and it was in the 180s. This has been a pattern. No epistaxis, no double vision, no focal neurologic symptoms. Hypertension control has been a problem. Chart reviewed: she had Renal U/S '09 that was normal; CT angio to r/o RAS '09 was normal. She has been unable to increase dose of coreg due to bradycardia. She is on a maximum dose of Benicar and Norvasc. She does not want to increase her furosemide.  PMH, FamHx and SocHx reviewed for any changes and relevance.    Review of Systems Constitutional normal HEENT- normal Cor - no complaints Pul - no SOB/DOE; does have post nasal drip GI - negative.     Objective:   Physical Exam Vitals reveiwed - SBP elevated HEENT C&S Clear Cor - RRR, II/VI systolic murmur Chest - clear to A&P Abd - soft Neuro - A&O x 3, CN II-XII normal, normal gait.        Assessment & Plan:

## 2010-11-27 NOTE — Patient Instructions (Signed)
Blood pressure - running a bit high but without symptoms. Reviewed all your meds and the best choice is to increase dosing of calcium channel blockers: stop the norvasc. Start Diltiazem CD 300mg  once a day. If the BP doesn't come down the next step is to look at increasing the furosemide. Keep track of your blood pressure readings and let me know.

## 2010-11-29 ENCOUNTER — Telehealth: Payer: Self-pay | Admitting: *Deleted

## 2010-11-29 NOTE — Telephone Encounter (Signed)
Good control

## 2010-11-29 NOTE — Assessment & Plan Note (Signed)
Patinet with poorly controlled hypertension despite a 4 drug regimen.   Plan - d/c/ amlodipine           Start diltiazem 300mg  qd           BP check in 5-7 days.

## 2010-11-29 NOTE — Telephone Encounter (Signed)
Pt checked BP today and yesterday  11/28/2010 am reading 120/74 11/29/2010 am reading 122/60  FYI

## 2011-01-06 ENCOUNTER — Ambulatory Visit (HOSPITAL_COMMUNITY): Payer: Medicare Other | Attending: Internal Medicine | Admitting: Radiology

## 2011-01-06 ENCOUNTER — Encounter (INDEPENDENT_AMBULATORY_CARE_PROVIDER_SITE_OTHER): Payer: Medicare Other | Admitting: Cardiology

## 2011-01-06 DIAGNOSIS — I6529 Occlusion and stenosis of unspecified carotid artery: Secondary | ICD-10-CM

## 2011-01-06 DIAGNOSIS — I080 Rheumatic disorders of both mitral and aortic valves: Secondary | ICD-10-CM | POA: Insufficient documentation

## 2011-01-06 DIAGNOSIS — I379 Nonrheumatic pulmonary valve disorder, unspecified: Secondary | ICD-10-CM | POA: Insufficient documentation

## 2011-01-06 DIAGNOSIS — R011 Cardiac murmur, unspecified: Secondary | ICD-10-CM | POA: Insufficient documentation

## 2011-01-06 DIAGNOSIS — E785 Hyperlipidemia, unspecified: Secondary | ICD-10-CM | POA: Insufficient documentation

## 2011-01-06 DIAGNOSIS — I10 Essential (primary) hypertension: Secondary | ICD-10-CM | POA: Insufficient documentation

## 2011-01-06 DIAGNOSIS — I079 Rheumatic tricuspid valve disease, unspecified: Secondary | ICD-10-CM | POA: Insufficient documentation

## 2011-01-07 ENCOUNTER — Ambulatory Visit (INDEPENDENT_AMBULATORY_CARE_PROVIDER_SITE_OTHER): Payer: Medicare Other | Admitting: Internal Medicine

## 2011-01-07 VITALS — BP 148/78 | HR 58 | Temp 97.6°F | Wt 104.0 lb

## 2011-01-07 DIAGNOSIS — I10 Essential (primary) hypertension: Secondary | ICD-10-CM

## 2011-01-07 MED ORDER — HYDRALAZINE HCL 25 MG PO TABS: ORAL_TABLET | ORAL | Status: DC

## 2011-01-07 NOTE — Assessment & Plan Note (Signed)
Highly variable BP with excursions to the SBP = 200 range on CCB, B-blocker (coreg), ARB and diuretic.   Plan - d/c benicar after first two days of hydralazine., continue all other meds           Hydralazine 25 mg once a day x 2 days, bid x 2 days and then tid.

## 2011-01-07 NOTE — Patient Instructions (Signed)
Diificult to control blood pressure which is highly variable. No symptoms or abnormal findinds on exam.  Plan - start hydralazine 25 mg: 1 tablet per day x 2 days, then 1 tablet twice a day x 2 days and then 1 tablet three times a day           Stop the benicar after the first two days of hydralazine           Continue the cardiazem, the coreg and the furosemide           Continue to monitor your BP at home and call if too high or too low.

## 2011-01-07 NOTE — Progress Notes (Signed)
  Subjective:    Patient ID: [name], female    DOB: [birth year], 77 y.o.   MRN: [id number]  HPI Mrs. Pemberton presents for follow-up of BP. She has had readings in the 140s at home. On several occasions, e.g. For carotid doppler or 2 Decho she would have very high readings in the 200's/100's. She does have a mild headache when pressure is really high but otherwise she feels fine.  While at Dr. Ewell Poe office she had readings in the 120 range. Reviewed chart - CT angio abdomen in '09 with widely patent renal arteries.   Review of Systems System review is negative for any constitutional, cardiac, pulmonary, GI or neuro symptoms or complaints     Objective:   Physical Exam Vitals - BP noted. ON repeat BP 200/60 right and left arm. Gen'l - very thin white woman in no distress HEENT - normal Cor - RRR Neuro - A&O x 3, CN II-XII normal        Assessment & Plan:

## 2011-01-08 NOTE — Progress Notes (Signed)
Pt was notified.  

## 2011-01-13 NOTE — Progress Notes (Signed)
Pt was notified.  

## 2011-02-13 ENCOUNTER — Ambulatory Visit (INDEPENDENT_AMBULATORY_CARE_PROVIDER_SITE_OTHER): Payer: Medicare Other | Admitting: Internal Medicine

## 2011-02-13 ENCOUNTER — Encounter: Payer: Self-pay | Admitting: Internal Medicine

## 2011-02-13 VITALS — BP 160/62 | HR 65 | Temp 98.4°F | Resp 16 | Wt 106.2 lb

## 2011-02-13 DIAGNOSIS — I10 Essential (primary) hypertension: Secondary | ICD-10-CM

## 2011-02-13 MED ORDER — OLMESARTAN MEDOXOMIL-HCTZ 40-12.5 MG PO TABS: 1.0000 | ORAL_TABLET | Freq: Every day | ORAL | Status: DC

## 2011-02-13 NOTE — Patient Instructions (Signed)

## 2011-02-13 NOTE — Progress Notes (Signed)
  Subjective:    Patient ID: [name], female    DOB: [birth year], 77 y.o.   MRN: [id number]  Hypertension This is a chronic problem. The current episode started more than 1 year ago. The problem has been gradually worsening since onset. The problem is uncontrolled. Pertinent negatives include no anxiety, blurred vision, chest pain, headaches, malaise/fatigue, neck pain, orthopnea, palpitations, peripheral edema, PND, shortness of breath or sweats. Past treatments include central alpha agonists, calcium channel blockers, diuretics and beta blockers. The current treatment provides mild improvement. Compliance problems include psychosocial issues.       Review of Systems  Constitutional: Negative.  Negative for malaise/fatigue.  HENT: Negative for neck pain.   Eyes: Negative.  Negative for blurred vision.  Respiratory: Negative for apnea, cough, choking, chest tightness, shortness of breath, wheezing and stridor.   Cardiovascular: Negative for chest pain, palpitations, orthopnea, leg swelling and PND.  Gastrointestinal: Negative for nausea, vomiting, abdominal pain, diarrhea, constipation, blood in stool, abdominal distention and anal bleeding.  Genitourinary: Negative.  Negative for dysuria, hematuria and difficulty urinating.  Musculoskeletal: Negative.   Skin: Negative.   Neurological: Positive for dizziness. Negative for tremors, seizures, syncope, facial asymmetry, speech difficulty, weakness, light-headedness, numbness and headaches.  Hematological: Negative.   Psychiatric/Behavioral: Negative.        Objective:   Physical Exam  Vitals reviewed. Constitutional: She is oriented to person, place, and time. She appears well-developed and well-nourished.  HENT:  Head: Normocephalic and atraumatic.  Mouth/Throat: Oropharynx is clear and moist. No oropharyngeal exudate.  Eyes: Conjunctivae are normal. Right eye exhibits no discharge. Left eye exhibits no discharge. No scleral  icterus.  Neck: Normal range of motion. Neck supple. No JVD present. No tracheal deviation present. No thyromegaly present.  Cardiovascular: Normal rate, regular rhythm and intact distal pulses.  Exam reveals no gallop and no friction rub.   Murmur heard. Pulmonary/Chest: Effort normal and breath sounds normal. No stridor. No respiratory distress. She has no wheezes. She has no rales. She exhibits no tenderness.  Abdominal: Soft. Bowel sounds are normal. She exhibits no distension and no mass. There is no tenderness. There is no rebound and no guarding.  Musculoskeletal: Normal range of motion. She exhibits no edema and no tenderness.  Lymphadenopathy:    She has no cervical adenopathy.  Neurological: She is oriented to person, place, and time. She displays normal reflexes. She exhibits normal muscle tone.  Skin: Skin is warm and dry. No rash noted. She is not diaphoretic. No erythema. No pallor.  Psychiatric: She has a normal mood and affect. Her behavior is normal. Judgment and thought content normal.      Lab Results  Component Value Date   WBC 5.1 02/05/2009   HGB 11.8* 02/05/2009   HCT 35.2* 02/05/2009   PLT 168.0 02/05/2009   GLUCOSE 89 06/13/2010   CHOL 162 10/28/2010   TRIG 83.0 10/28/2010   HDL 85.20 10/28/2010   LDLCALC 60 10/28/2010   ALT 20 10/28/2010   AST 30 10/28/2010   NA 143 06/13/2010   K 3.8 06/13/2010   CL 107 06/13/2010   CREATININE 0.8 07/15/2010   BUN 20 06/13/2010   CO2 29 06/13/2010   TSH 0.62 02/05/2009   HGBA1C 6.0 08/18/2008      Assessment & Plan:

## 2011-02-13 NOTE — Assessment & Plan Note (Signed)
She was not taking Benicar so I put her back on that and she was only taking lasix once a day so I put her on HCTZ with the Benicar, she will continue all of the other meds

## 2011-02-14 ENCOUNTER — Ambulatory Visit: Payer: Medicare Other | Admitting: Internal Medicine

## 2011-02-18 ENCOUNTER — Telehealth: Payer: Self-pay | Admitting: *Deleted

## 2011-02-18 NOTE — Telephone Encounter (Signed)
Reviewed patient's chart - no RAS. Advised her to continue all present medications and to restart furosemide. Call back thursday

## 2011-02-18 NOTE — Telephone Encounter (Signed)
Pt calling she states her BP has been running high, She saw Dr Yetta Barre for this on 02/13/2011, Pt states she was taken off lasix and put on Benicar/ HCTZ. Pt has been keepin a check on her BP today and this afternoon about 12 noon pts BP was 206/64 Pt took her BP again while she was on the phone with me at 6:07pm her BP was 201/ 78. Pt states she is having no symptoms currently. She states she feels a little fatigue but no headache. What do you advise for pt.

## 2011-02-20 ENCOUNTER — Telehealth: Payer: Self-pay | Admitting: *Deleted

## 2011-02-20 NOTE — Telephone Encounter (Signed)
Pt calling with an update in her BP readings  This am BP reading was 148/46 This pm BP reading was 127/39 Heart Rate 53 FYI

## 2011-02-21 NOTE — Telephone Encounter (Signed)
Noted and looking good

## 2011-02-27 ENCOUNTER — Ambulatory Visit (INDEPENDENT_AMBULATORY_CARE_PROVIDER_SITE_OTHER): Payer: Medicare Other | Admitting: Internal Medicine

## 2011-02-27 ENCOUNTER — Encounter: Payer: Self-pay | Admitting: Internal Medicine

## 2011-02-27 DIAGNOSIS — I10 Essential (primary) hypertension: Secondary | ICD-10-CM

## 2011-02-28 NOTE — Progress Notes (Signed)
  Subjective:    Patient ID: [name], female    DOB: 12-20-1932, 77 y.o.   MRN: [id number]  HPI Mrs. Kirshenbaum presents for Blood pressure follow-up. She has wide excursion of BP readings and is currently on a complex regimen of 5 medications: b-blocker, CCB, ARB, diuretic and central vasodilator. Her readings from home are better with SBP generally in the 140s. She has been feeling better, w/o headaches, visual changes, epistaxis. She is tolerating the current regimen well.  I have reviewed the patient's medical history in detail and updated the computerized patient record.   Review of Systems System review is negative for any constitutional, cardiac, pulmonary, GI or neuro symptoms or complaints other than as described in the HPI.     Objective:   Physical Exam Vitals noted with BP 168/40 Gen'l - thin, white woman in no distress HEENT- C&S clear, PERRLA Cor- RRR PUlm - normal respirations       Assessment & Plan:

## 2011-02-28 NOTE — Assessment & Plan Note (Signed)
BP Readings from Last 3 Encounters:  02/27/11 168/40  02/13/11 160/62  01/07/11 148/78   Improved BP control by her report of home readings. She has r/o for RAS or other underlying cause for HTN.  Plan - continue her present regimen.           ROV 3-4 months or prn

## 2011-03-05 ENCOUNTER — Other Ambulatory Visit: Payer: Self-pay | Admitting: *Deleted

## 2011-03-05 MED ORDER — CARVEDILOL 12.5 MG PO TABS: 6.2500 mg | ORAL_TABLET | Freq: Two times a day (BID) | ORAL | Status: DC

## 2011-05-13 ENCOUNTER — Telehealth: Payer: Self-pay | Admitting: *Deleted

## 2011-05-13 MED ORDER — ATORVASTATIN CALCIUM 80 MG PO TABS: 80.0000 mg | ORAL_TABLET | Freq: Every day | ORAL | Status: DC

## 2011-05-13 NOTE — Telephone Encounter (Signed)
Refill request atorvastatin calcium 80mg .

## 2011-06-03 ENCOUNTER — Other Ambulatory Visit: Payer: Self-pay | Admitting: *Deleted

## 2011-06-03 DIAGNOSIS — I10 Essential (primary) hypertension: Secondary | ICD-10-CM

## 2011-06-03 MED ORDER — OLMESARTAN MEDOXOMIL-HCTZ 40-12.5 MG PO TABS: 1.0000 | ORAL_TABLET | Freq: Every day | ORAL | Status: DC

## 2011-06-09 ENCOUNTER — Encounter: Payer: Self-pay | Admitting: Internal Medicine

## 2011-06-09 ENCOUNTER — Ambulatory Visit (INDEPENDENT_AMBULATORY_CARE_PROVIDER_SITE_OTHER): Payer: Medicare Other | Admitting: Internal Medicine

## 2011-06-09 DIAGNOSIS — M545 Low back pain, unspecified: Secondary | ICD-10-CM

## 2011-06-09 DIAGNOSIS — M62838 Other muscle spasm: Secondary | ICD-10-CM

## 2011-06-09 MED ORDER — CYCLOBENZAPRINE HCL 5 MG PO TABS: 5.0000 mg | ORAL_TABLET | Freq: Two times a day (BID) | ORAL | Status: AC | PRN
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
## 2011-06-30 ENCOUNTER — Telehealth: Payer: Self-pay | Admitting: Internal Medicine

## 2011-06-30 NOTE — Telephone Encounter (Signed)
Busy

## 2011-06-30 NOTE — Telephone Encounter (Signed)
New msg Pt wants to know should she continue with Dr Gala Romney or see another physician please call her back

## 2011-07-01 NOTE — Telephone Encounter (Signed)
Spoke with pt, per dr bensimhon the pt can see someone else here. The pt request to see dr cooper. Follow up appt made.

## 2011-07-15 ENCOUNTER — Other Ambulatory Visit: Payer: Self-pay | Admitting: *Deleted

## 2011-07-15 MED ORDER — FUROSEMIDE 40 MG PO TABS: 40.0000 mg | ORAL_TABLET | Freq: Every day | ORAL | Status: DC

## 2011-07-31 ENCOUNTER — Ambulatory Visit: Payer: Medicare Other | Admitting: Internal Medicine

## 2011-08-25 ENCOUNTER — Encounter: Payer: Self-pay | Admitting: Internal Medicine

## 2011-08-25 ENCOUNTER — Ambulatory Visit (INDEPENDENT_AMBULATORY_CARE_PROVIDER_SITE_OTHER): Payer: Medicare Other | Admitting: Internal Medicine

## 2011-08-25 ENCOUNTER — Ambulatory Visit (INDEPENDENT_AMBULATORY_CARE_PROVIDER_SITE_OTHER)
Admission: RE | Admit: 2011-08-25 | Discharge: 2011-08-25 | Disposition: A | Discharge status: Home / Self Care | Payer: Medicare Other | Source: Ambulatory Visit | Attending: Internal Medicine | Admitting: Internal Medicine

## 2011-08-25 VITALS — BP 140/68 | HR 54 | Temp 97.8°F | Resp 16

## 2011-08-25 DIAGNOSIS — R071 Chest pain on breathing: Secondary | ICD-10-CM

## 2011-08-25 DIAGNOSIS — E785 Hyperlipidemia, unspecified: Secondary | ICD-10-CM

## 2011-08-25 DIAGNOSIS — I10 Essential (primary) hypertension: Secondary | ICD-10-CM

## 2011-08-25 DIAGNOSIS — E039 Hypothyroidism, unspecified: Secondary | ICD-10-CM

## 2011-08-25 DIAGNOSIS — J309 Allergic rhinitis, unspecified: Secondary | ICD-10-CM

## 2011-08-25 DIAGNOSIS — R7309 Other abnormal glucose: Secondary | ICD-10-CM

## 2011-08-25 DIAGNOSIS — R0789 Other chest pain: Secondary | ICD-10-CM

## 2011-08-25 MED ORDER — FLUTICASONE PROPIONATE 50 MCG/ACT NA SUSP: 2.0000 | Freq: Every day | NASAL | Status: DC | PRN

## 2011-08-25 NOTE — Progress Notes (Signed)
Subjective:    Patient ID: [name], female    DOB: 1932/10/28, 78 y.o.   MRN: [id number]  Chest Pain  This is a recurrent problem. The current episode started in the past 7 days. The onset quality is gradual. The problem occurs intermittently. The problem has been unchanged. The pain is present in the lateral region (right lateral rib cage). The pain is at a severity of 2/10. The pain is moderate. The quality of the pain is described as sharp. The pain does not radiate. Associated symptoms include back pain (chronic, unchanged). Pertinent negatives include no abdominal pain, claudication, cough, diaphoresis, dizziness, exertional chest pressure, fever, headaches, hemoptysis, irregular heartbeat, leg pain, lower extremity edema, malaise/fatigue, nausea, near-syncope, numbness, orthopnea, palpitations, PND, shortness of breath, sputum production, syncope, vomiting or weakness. The pain is aggravated by movement. She has tried nothing for the symptoms.  Pertinent negatives for past medical history include no seizures.      Review of Systems  Constitutional: Negative for fever, chills, malaise/fatigue, diaphoresis, activity change, appetite change, fatigue and unexpected weight change.  HENT: Positive for ear pain (pressure and popping in her left ear), congestion, rhinorrhea, sneezing and postnasal drip. Negative for hearing loss, nosebleeds, sore throat, facial swelling, drooling, mouth sores, trouble swallowing, neck pain, neck stiffness, dental problem, voice change, sinus pressure, tinnitus and ear discharge.   Eyes: Negative.   Respiratory: Negative for apnea, cough, hemoptysis, sputum production, choking, chest tightness, shortness of breath and stridor.   Cardiovascular: Positive for chest pain. Negative for palpitations, orthopnea, claudication, leg swelling, syncope, PND and near-syncope.  Gastrointestinal: Negative for nausea, vomiting, abdominal pain, diarrhea, constipation, blood in  stool, abdominal distention, anal bleeding and rectal pain.  Genitourinary: Negative for dysuria, urgency, frequency, hematuria, flank pain, decreased urine volume, enuresis, difficulty urinating and dyspareunia.  Musculoskeletal: Positive for back pain (chronic, unchanged). Negative for myalgias, joint swelling, arthralgias and gait problem.  Skin: Negative for color change, pallor, rash and wound.  Neurological: Negative for dizziness, tremors, seizures, syncope, facial asymmetry, speech difficulty, weakness, light-headedness, numbness and headaches.  Hematological: Negative for adenopathy. Does not bruise/bleed easily.  Psychiatric/Behavioral: Negative.        Objective:   Physical Exam  Vitals reviewed. Constitutional: She is oriented to person, place, and time. She appears well-developed and well-nourished. No distress.  HENT:  Head: No trismus in the jaw.  Right Ear: Hearing, tympanic membrane, external ear and ear canal normal.  Left Ear: Hearing, tympanic membrane, external ear and ear canal normal.  Nose: Mucosal edema and rhinorrhea present. No nose lacerations, sinus tenderness, nasal deformity, septal deviation or nasal septal hematoma. No epistaxis.  No foreign bodies. Right sinus exhibits no maxillary sinus tenderness and no frontal sinus tenderness. Left sinus exhibits no maxillary sinus tenderness and no frontal sinus tenderness.  Mouth/Throat: Mucous membranes are normal. Mucous membranes are not pale, not dry and not cyanotic. No uvula swelling. No oropharyngeal exudate, posterior oropharyngeal edema, posterior oropharyngeal erythema or tonsillar abscesses.  Eyes: Conjunctivae are normal. Right eye exhibits no discharge. Left eye exhibits no discharge. No scleral icterus.  Neck: Normal range of motion. Neck supple. No JVD present. No tracheal deviation present. No thyromegaly present.  Cardiovascular: Normal rate, regular rhythm and intact distal pulses.  Exam reveals no  gallop and no friction rub.   Murmur heard. Pulmonary/Chest: Effort normal and breath sounds normal. No accessory muscle usage or stridor. Not tachypneic. No respiratory distress. She has no decreased breath sounds. She has no wheezes.  She has no rhonchi. She has no rales. Chest wall is not dull to percussion. She exhibits no mass, no tenderness, no bony tenderness, no laceration, no crepitus, no edema, no deformity, no swelling and no retraction.  Abdominal: Soft. Bowel sounds are normal. She exhibits no distension and no mass. There is no tenderness. There is no rebound and no guarding.  Musculoskeletal: Normal range of motion. She exhibits no edema and no tenderness.  Lymphadenopathy:    She has no cervical adenopathy.  Neurological: She is oriented to person, place, and time.  Skin: Skin is warm and dry. No rash noted. She is not diaphoretic. No erythema. No pallor.  Psychiatric: She has a normal mood and affect. Her behavior is normal. Judgment and thought content normal.      Lab Results  Component Value Date   WBC 5.1 02/05/2009   HGB 11.8* 02/05/2009   HCT 35.2* 02/05/2009   PLT 168.0 02/05/2009   GLUCOSE 89 06/13/2010   CHOL 162 10/28/2010   TRIG 83.0 10/28/2010   HDL 85.20 10/28/2010   LDLCALC 60 10/28/2010   ALT 20 10/28/2010   AST 30 10/28/2010   NA 143 06/13/2010   K 3.8 06/13/2010   CL 107 06/13/2010   CREATININE 0.8 07/15/2010   BUN 20 06/13/2010   CO2 29 06/13/2010   TSH 0.62 02/05/2009   HGBA1C 6.0 08/18/2008      Assessment & Plan:

## 2011-08-25 NOTE — Assessment & Plan Note (Signed)
Her BP is well controlled, I will check her lytes and renal function 

## 2011-08-25 NOTE — Assessment & Plan Note (Signed)
I will check her TSH today 

## 2011-08-25 NOTE — Assessment & Plan Note (Signed)
She has not been using the flonase so I asked her to restart it to help with the AR and ETD

## 2011-08-25 NOTE — Patient Instructions (Signed)
Allergic Rhinitis Allergic rhinitis is when the mucous membranes in the nose respond to allergens. Allergens are particles in the air that cause your body to have an allergic reaction. This causes you to release allergic antibodies. Through a chain of events, these eventually cause you to release histamine into the blood stream (hence the use of antihistamines). Although meant to be protective to the body, it is this release that causes your discomfort, such as frequent sneezing, congestion and an itchy runny nose.  CAUSES  The pollen allergens may come from grasses, trees, and weeds. This is seasonal allergic rhinitis, or "hay fever." Other allergens cause year-round allergic rhinitis (perennial allergic rhinitis) such as house dust mite allergen, pet dander and mold spores.  SYMPTOMS   Nasal stuffiness (congestion).   Runny, itchy nose with sneezing and tearing of the eyes.   There is often an itching of the mouth, eyes and ears.  It cannot be cured, but it can be controlled with medications. DIAGNOSIS  If you are unable to determine the offending allergen, skin or blood testing may find it. TREATMENT   Avoid the allergen.   Medications and allergy shots (immunotherapy) can help.   Hay fever may often be treated with antihistamines in pill or nasal spray forms. Antihistamines block the effects of histamine. There are over-the-counter medicines that may help with nasal congestion and swelling around the eyes. Check with your caregiver before taking or giving this medicine.  If the treatment above does not work, there are many new medications your caregiver can prescribe. Stronger medications may be used if initial measures are ineffective. Desensitizing injections can be used if medications and avoidance fails. Desensitization is when a patient is given ongoing shots until the body becomes less sensitive to the allergen. Make sure you follow up with your caregiver if problems continue. SEEK  MEDICAL CARE IF:   You develop fever (more than 100.5 F (38.1 C).   You develop a cough that does not stop easily (persistent).   You have shortness of breath.   You start wheezing.   Symptoms interfere with normal daily activities.  Document Released: 01/07/2001 Document Revised: 04/03/2011 Document Reviewed: 07/19/2008 [facility] Patient Information 2012 [place], [place].Chest Wall Pain Chest wall pain is pain in or around the bones and muscles of your chest. It may take up to 6 weeks to get better. It may take longer if you must stay physically active in your work and activities.  CAUSES  Chest wall pain may happen on its own. However, it may be caused by:  A viral illness like the flu.   Injury.   Coughing.   Exercise.   Arthritis.   Fibromyalgia.   Shingles.  HOME CARE INSTRUCTIONS   Avoid overtiring physical activity. Try not to strain or perform activities that cause pain. This includes any activities using your chest or your abdominal and side muscles, especially if heavy weights are used.   Put ice on the sore area.   Put ice in a plastic bag.   Place a towel between your skin and the bag.   Leave the ice on for 15 to 20 minutes per hour while awake for the first 2 days.   Only take over-the-counter or prescription medicines for pain, discomfort, or fever as directed by your caregiver.  SEEK IMMEDIATE MEDICAL CARE IF:   Your pain increases, or you are very uncomfortable.   You have a fever.   Your chest pain becomes worse.   You have  new, unexplained symptoms.   You have nausea or vomiting.   You feel sweaty or lightheaded.   You have a cough with phlegm (sputum), or you cough up blood.  MAKE SURE YOU:   Understand these instructions.   Will watch your condition.   Will get help right away if you are not doing well or get worse.  Document Released: 04/14/2005 Document Revised: 04/03/2011 Document Reviewed: 12/09/2010 [facility] Patient  Information 2012 [place], [place].

## 2011-08-25 NOTE — Assessment & Plan Note (Signed)
I will check a CXR to look for rib fracture, pneumothorax, etc and will check a d-dimer to look for PE

## 2011-08-26 ENCOUNTER — Ambulatory Visit (INDEPENDENT_AMBULATORY_CARE_PROVIDER_SITE_OTHER): Payer: Medicare Other | Admitting: Endocrinology

## 2011-08-26 VITALS — BP 132/68 | HR 57 | Temp 97.8°F | Wt 109.0 lb

## 2011-08-26 DIAGNOSIS — B029 Zoster without complications: Secondary | ICD-10-CM

## 2011-08-26 MED ORDER — VALACYCLOVIR HCL 1 G PO TABS: 1000.0000 mg | ORAL_TABLET | Freq: Three times a day (TID) | ORAL | Status: DC

## 2011-08-26 MED ORDER — MEPERIDINE HCL 50 MG PO TABS: 50.0000 mg | ORAL_TABLET | ORAL | Status: AC | PRN

## 2011-08-26 NOTE — Progress Notes (Signed)
Subjective:    Patient ID: [name], female    DOB: 10-Mar-1933, 78 y.o.   MRN: [id number]  HPI Pt states few days of moderate pain at the left buttock.  Today she has assoc rash.   Past Medical History  Diagnosis Date  . Renal calculus   . PVD (peripheral vascular disease)   . Anemia, pernicious   . Atrophic gastritis   . Hypothyroidism   . HLD (hyperlipidemia)   . HTN (hypertension)   . CAD (coronary artery disease)   . Carotid artery stenosis   . Median arcuate ligament syndrome     Past Surgical History  Procedure Date  . Coronary artery bypass graft   . Appendectomy   . Hysterectomy - unknown type   . Biopsy thyroid   . Thyroid lobectomy   . Abdominal hysterectomy     History   Social History  . Marital Status: Married    Spouse Name: N/A    Number of Children: N/A  . Years of Education: 12   Occupational History  . retired   .     Social History Main Topics  . Smoking status: Never Smoker   . Smokeless tobacco: Never Used  . Alcohol Use: No  . Drug Use: No  . Sexually Active: Not on file   Other Topics Concern  . Not on file   Social History Narrative   [name], Married. Lives with husband. Had been a very active woman.    Current Outpatient Prescriptions on File Prior to Visit  Medication Sig Dispense Refill  . aspirin 81 MG tablet Take 81 mg by mouth daily.        Marland Kitchen atorvastatin (LIPITOR) 80 MG tablet Take 1 tablet (80 mg total) by mouth daily.  30 tablet  11  . Calcium Citrate-Vitamin D (CITRACAL + D PO) Take by mouth daily.        . carvedilol (COREG) 12.5 MG tablet Take 0.5 tablets (6.25 mg total) by mouth 2 (two) times daily.  30 tablet  5  . cholecalciferol (VITAMIN D) 1000 UNITS tablet Take 1,000 Units by mouth daily.      . cyanocobalamin (,VITAMIN B-12,) 1000 MCG/ML injection Inject 1,000 mcg into the muscle every 30 (thirty) days.        Marland Kitchen diltiazem (CARDIZEM CD) 300 MG 24 hr capsule Take 1 capsule (300 mg total) by mouth daily.  30  capsule  11  . fluticasone (FLONASE) 50 MCG/ACT nasal spray Place 2 sprays into the nose daily as needed.  16 g  11  . furosemide (LASIX) 40 MG tablet Take 1 tablet (40 mg total) by mouth daily.  30 tablet  5  . hydrALAZINE (APRESOLINE) 25 MG tablet Titrate up the medication: 1 per day x 2 days, 1 twice a day x 2 days  then 1 tablet three times a day.  90 tablet  11  . levothyroxine (SYNTHROID, LEVOTHROID) 75 MCG tablet Take 75 mcg by mouth daily.        . Multiple Vitamins-Minerals (ICAPS) CAPS Take by mouth daily.        Marland Kitchen olmesartan-hydrochlorothiazide (BENICAR HCT) 40-12.5 MG per tablet Take 1 tablet by mouth daily.  30 tablet  5  . ONE TOUCH ULTRA TEST test strip as directed.      . Potassium 99 MG TABS Take by mouth.      . sodium chloride (OCEAN) 0.65 % nasal spray 1 spray by Nasal route as needed.        Marland Kitchen  sucralfate (CARAFATE) 1 G tablet Take 1 g by mouth as needed.        . traMADol (ULTRAM) 50 MG tablet Take 50 mg by mouth every 6 (six) hours as needed.          Allergies  Allergen Reactions  . Codeine   . Sulfonamide Derivatives     Family History  Problem Relation Age of Onset  . Hypertension    . Breast cancer    . Prostate cancer    . Colon polyps    . Diabetes    . Heart disease    . Kidney disease    . Heart disease Mother   . Cancer Brother     prostate  . Cancer Other     niece - breast cancer    BP 132/68  Pulse 57  Temp(Src) 97.8 F (36.6 C) (Oral)  Wt 109 lb (49.442 kg)  SpO2 99%    Review of Systems Denies fever.      Objective:   Physical Exam VITAL SIGNS:  See vs page GENERAL: no distress Left buttock:  Moderate polypapular rash.       Assessment & Plan:  Zoster, new

## 2011-08-26 NOTE — Patient Instructions (Addendum)
Here is a stronger pain pill.  You may want to try 1/2 pill at first. i have sent a prescription to your pharmacy, for an anti-virus pill.   I hope you feel better soon.  If you don't feel better in 2 weeks, please call back.   Shingles Shingles is caused by the same virus that causes chickenpox. The first feelings may be pain or tingling. A rash will follow in a couple days. The rash may occur on any area of the body. Long-lasting pain is more likely in an elderly person. It can last months to years. There are medicines that can help prevent pain if you start taking them early. HOME CARE    Place cool cloths on the rash.   Only take medicine as told by your doctor.   You may use calamine lotion to relieve itchy skin.   Avoid touching:   Babies.   Children with inflamed skin (eczema).   People who have gotten transplanted organs.   People with chronic illnesses, such as leukemia and AIDS.   If the rash is on the face, you may need to see a specialist. Keep all appointments. Shingles must be kept away from the eyes, if possible.   Keep all appointments.   Avoid touching the eyes or eye area, if possible.  GET HELP RIGHT AWAY IF:    You have any pain on the face or eye.   Your medicines do not help.   Your redness or puffiness (swelling) spreads.   You have a fever.   You notice any red lines going away from the rash area.  MAKE SURE YOU:    Understand these instructions.   Will watch your condition.   Will get help right away if you are not doing well or get worse.  Document Released: 10/01/2007 Document Revised: 04/03/2011 Document Reviewed: 10/01/2007 [facility] Patient Information 2012 [place], [place].

## 2011-08-29 ENCOUNTER — Telehealth: Payer: Self-pay

## 2011-08-29 MED ORDER — PREGABALIN 50 MG PO CAPS: 50.0000 mg | ORAL_CAPSULE | Freq: Three times a day (TID) | ORAL | Status: DC

## 2011-08-29 NOTE — Telephone Encounter (Signed)
pls call in the lyrica

## 2011-08-29 NOTE — Telephone Encounter (Signed)
cxr shows normal lungs but she has osteopenia and arthritis in her neck

## 2011-08-29 NOTE — Telephone Encounter (Signed)
Patient notified

## 2011-08-29 NOTE — Telephone Encounter (Signed)
Please advise on xray results. Thanks.

## 2011-08-29 NOTE — Telephone Encounter (Signed)
Patient called [name] stating that she was seen by Dr Everardo All 08/26/11. Pt was diagnosed with shingles and given pain medication which is not helping. Please advise Thanks

## 2011-09-02 ENCOUNTER — Ambulatory Visit: Payer: Medicare Other | Admitting: Cardiovascular Disease

## 2011-09-16 ENCOUNTER — Other Ambulatory Visit: Payer: Self-pay | Admitting: *Deleted

## 2011-09-16 DIAGNOSIS — I10 Essential (primary) hypertension: Secondary | ICD-10-CM

## 2011-09-16 MED ORDER — OLMESARTAN MEDOXOMIL-HCTZ 40-12.5 MG PO TABS: 1.0000 | ORAL_TABLET | Freq: Every day | ORAL | Status: DC

## 2011-09-16 NOTE — Telephone Encounter (Signed)
PATIENT AWARE MEDICATION SAMPLES TO PICK UP AND TO MAKE APPT. WITH MD

## 2011-10-08 ENCOUNTER — Encounter: Payer: Self-pay | Admitting: Cardiovascular Disease

## 2011-10-08 ENCOUNTER — Ambulatory Visit (INDEPENDENT_AMBULATORY_CARE_PROVIDER_SITE_OTHER): Payer: Medicare Other | Admitting: Cardiovascular Disease

## 2011-10-08 VITALS — BP 170/60 | HR 55 | Ht 64.0 in | Wt 107.4 lb

## 2011-10-08 DIAGNOSIS — I340 Nonrheumatic mitral (valve) insufficiency: Secondary | ICD-10-CM

## 2011-10-08 DIAGNOSIS — I059 Rheumatic mitral valve disease, unspecified: Secondary | ICD-10-CM

## 2011-10-08 DIAGNOSIS — I2510 Atherosclerotic heart disease of native coronary artery without angina pectoris: Secondary | ICD-10-CM

## 2011-10-08 NOTE — Progress Notes (Signed)
HPI:  76 six-year-old woman presenting for followup evaluation. The patient has coronary artery disease status post CABG in 2007. She is also followed for asymptomatic carotid stenosis. She has been previously seen by Dr. Gala Romney and I will be assuming her cardiac care from here forward.  At the time of her visit last year, a heart murmur was noted. She underwent an echocardiogram demonstrated normal left ventricular function with moderate mitral regurgitation. 1 year followup is recommended. A carotid duplex was last performed in September 2012 and this demonstrated moderate carotid disease bilaterally in the 40-59% range. 1 year followup is recommended.  Overall the patient is feeling well. She had shingles earlier this year and had a very difficult time with that. Her recovery has been prolonged. She is back to exercising 15 minutes a day and was previously doing 30 minutes a day. She has no exertional chest tightness or shortness of breath. She denies palpitations, lightheadedness, or syncope.    Outpatient Encounter Prescriptions as of 10/08/2011  Medication Sig Dispense Refill  . aspirin 81 MG tablet Take 81 mg by mouth daily.        Marland Kitchen atorvastatin (LIPITOR) 80 MG tablet Take 1 tablet (80 mg total) by mouth daily.  30 tablet  11  . Calcium Citrate-Vitamin D (CITRACAL + D PO) Take by mouth daily.        . carvedilol (COREG) 12.5 MG tablet Take 0.5 tablets (6.25 mg total) by mouth 2 (two) times daily.  30 tablet  5  . cholecalciferol (VITAMIN D) 1000 UNITS tablet Take 2,000 Units by mouth daily.       . cyanocobalamin (,VITAMIN B-12,) 1000 MCG/ML injection Inject 1,000 mcg into the muscle every 30 (thirty) days.        Marland Kitchen diltiazem (CARDIZEM CD) 300 MG 24 hr capsule Take 1 capsule (300 mg total) by mouth daily.  30 capsule  11  . fluticasone (FLONASE) 50 MCG/ACT nasal spray Place 2 sprays into the nose daily as needed.  16 g  11  . furosemide (LASIX) 40 MG tablet Take 1 tablet (40 mg total)  by mouth daily.  30 tablet  5  . hydrALAZINE (APRESOLINE) 25 MG tablet Take 25 mg by mouth 3 (three) times daily.      Marland Kitchen levothyroxine (SYNTHROID, LEVOTHROID) 75 MCG tablet Take 75 mcg by mouth daily.        . Multiple Vitamins-Minerals (ICAPS) CAPS Take by mouth daily.        Marland Kitchen olmesartan-hydrochlorothiazide (BENICAR HCT) 40-12.5 MG per tablet Take 1 tablet by mouth daily.  28 tablet  0  . ONE TOUCH ULTRA TEST test strip as directed.      . Potassium 99 MG TABS Take by mouth.      . sodium chloride (OCEAN) 0.65 % nasal spray Place 1 spray into the nose as needed.        . sucralfate (CARAFATE) 1 G tablet Take 1 g by mouth as needed.        . traMADol (ULTRAM) 50 MG tablet Take 50 mg by mouth every 6 (six) hours as needed.        Marland Kitchen DISCONTD: hydrALAZINE (APRESOLINE) 25 MG tablet Titrate up the medication: 1 per day x 2 days, 1 twice a day x 2 days  then 1 tablet three times a day.  90 tablet  11  . DISCONTD: pregabalin (LYRICA) 50 MG capsule Take 1 capsule (50 mg total) by mouth 3 (three) times daily.  90 capsule  3  . DISCONTD: valACYclovir (VALTREX) 1000 MG tablet Take 1 tablet (1,000 mg total) by mouth 3 (three) times daily.  21 tablet  0    Allergies  Allergen Reactions  . Codeine   . Sulfonamide Derivatives     Past Medical History  Diagnosis Date  . Renal calculus   . PVD (peripheral vascular disease)   . Anemia, pernicious   . Atrophic gastritis   . Hypothyroidism   . HLD (hyperlipidemia)   . HTN (hypertension)   . CAD (coronary artery disease)   . Carotid artery stenosis   . Median arcuate ligament syndrome     ROS: Negative except as per HPI  Ht 5\' 4"  (1.626 m)  Wt 48.716 kg (107 lb 6.4 oz)  BMI 18.44 kg/m2  PHYSICAL EXAM: Pt is alert and oriented, pleasant woman in NAD HEENT: normal Neck: JVP - normal, carotids 2+= with bilateral bruits Lungs: CTA bilaterally CV: RRR with grade 2/6 holosystolic murmur at the apex, heard well with the patient in the left lateral  decubitus Abd: soft, NT, Positive BS, no hepatomegaly Ext: no C/C/E, distal pulses intact and equal Skin: warm/dry no rash  EKG:  Normal sinus rhythm with nonspecific intraventricular block, new from previous tracing.  2D ECHO: Study Conclusions  - Left ventricle: The cavity size was normal. Wall thickness was normal. Systolic function was normal. The estimated ejection fraction was in the range of 55% to 60%. Wall motion was normal; there were no regional wall motion abnormalities. Features are consistent with a pseudonormal left ventricular filling pattern, with concomitant abnormal relaxation and increased filling pressure (grade 2 diastolic dysfunction). Doppler parameters are consistent with high ventricular filling pressure. - Aortic valve: Mild regurgitation. - Mitral valve: Moderate regurgitation. - Left atrium: The atrium was mildly dilated. - Pulmonary arteries: Systolic pressure was mildly to moderately increased. PA peak pressure: 43mm Hg (S).  ASSESSMENT AND PLAN: 1. CAD status post CABG. The patient remains stable. She has no anginal symptoms. She does have a new intraventricular conduction delay, but this is nonspecific and I do not see any clinical evidence of progressive coronary disease. She had typical anginal symptoms predating her CABG and these have not recurred. She will remain on her same medical program.  2. Mitral regurgitation, moderate. This is consistent with her physical exam today. She is due for a followup echocardiogram in September.  3. Hypertension, essential. Patient's blood pressure is elevated today. She has not yet taken her Benicar. Her blood pressure yesterday morning at home was 135/50. Continue observation and current medications for now.  4. Dyslipidemia. The patient is on high-dose Lipitor. Lipids last summer demonstrated a cholesterol of 162, triglycerides 83, HDL 85, and LDL 60. She will continue her same program.  5. Carotid stenosis  without history of stroke. Continue medical management. She has moderate carotid disease will undergo repeat carotid duplex in September.  Tonny Bollman 10/08/2011 11:55 AM

## 2011-10-08 NOTE — Patient Instructions (Addendum)
Your physician wants you to follow-up in 12 months. You will receive a reminder letter in the mail two months in advance. If you don't receive a letter, please call our office to schedule the follow-up appointment.  Your physician has requested that you have an echocardiogram. Echocardiography is a painless test that uses sound waves to create images of your heart. It provides your doctor with information about the size and shape of your heart and how well your heart's chambers and valves are working. This procedure takes approximately one hour. There are no restrictions for this procedure.To be done in September 2013

## 2011-10-09 ENCOUNTER — Encounter: Payer: Self-pay | Admitting: Internal Medicine

## 2011-10-09 ENCOUNTER — Other Ambulatory Visit (INDEPENDENT_AMBULATORY_CARE_PROVIDER_SITE_OTHER): Payer: Medicare Other

## 2011-10-09 ENCOUNTER — Ambulatory Visit (INDEPENDENT_AMBULATORY_CARE_PROVIDER_SITE_OTHER): Payer: Medicare Other | Admitting: Internal Medicine

## 2011-10-09 VITALS — BP 158/70 | HR 60 | Temp 97.9°F | Resp 16 | Ht 64.0 in | Wt 107.0 lb

## 2011-10-09 DIAGNOSIS — E785 Hyperlipidemia, unspecified: Secondary | ICD-10-CM

## 2011-10-09 DIAGNOSIS — I10 Essential (primary) hypertension: Secondary | ICD-10-CM

## 2011-10-09 LAB — LIPID PANEL
Cholesterol: 155 mg/dL (ref 0–200)
HDL: 88.5 mg/dL (ref >39.00)
LDL Cholesterol: 57 mg/dL (ref 0–99)
Total CHOL/HDL Ratio: 2
Triglycerides: 46 mg/dL (ref 0.0–149.0)
VLDL: 9.2 mg/dL (ref 0.0–40.0)

## 2011-10-09 LAB — HEPATIC FUNCTION PANEL
ALT: 18 U/L (ref 0–35)
AST: 26 U/L (ref 0–37)
Albumin: 3.8 g/dL (ref 3.5–5.2)
Alkaline Phosphatase: 35 U/L — ABNORMAL LOW (ref 39–117)
Bilirubin, Direct: 0.1 mg/dL (ref 0.0–0.3)
Total Bilirubin: 0.8 mg/dL (ref 0.3–1.2)
Total Protein: 6.8 g/dL (ref 6.0–8.3)

## 2011-10-09 NOTE — Patient Instructions (Addendum)
Blood pressure - the blood pressure is always variable. In order to better manage your BP we need to get more DATA. Please for the next 7-10 days check your blood pressure a couple of times a day, at different times, so we can see the overall trend and make the appropriate adjustment in your medication as needed.   Diabetes - per Dr. Sharl Ma - please call that office to have labs faxed to me: [phone]  Cholesterol - will check labs today with recommendations to follow. Will also check liver functions, kidney function and recheck sodium and potassium (all part of a single panel)  Heart - per Dr. Excell Seltzer - you are doing well.   Arthritis - up to date with Dr. Dareen Piano.

## 2011-10-12 ENCOUNTER — Encounter: Payer: Self-pay | Admitting: Internal Medicine

## 2011-10-12 NOTE — Progress Notes (Signed)
  Subjective:    Patient ID: [name], female    DOB: January 19, 1933, 78 y.o.   MRN: [id number]  HPI [name] presents with concerns about her blood pressure. She has been having occasional elevated readings: SBP 150's. She has been asymptomatic: no headache, nosebleeds, change in vision, other symptoms.  Past Medical History  Diagnosis Date  . Renal calculus   . PVD (peripheral vascular disease)   . Anemia, pernicious   . Atrophic gastritis   . Hypothyroidism   . HLD (hyperlipidemia)   . HTN (hypertension)   . CAD (coronary artery disease)   . Carotid artery stenosis   . Median arcuate ligament syndrome    Past Surgical History  Procedure Date  . Coronary artery bypass graft   . Appendectomy   . Hysterectomy - unknown type   . Biopsy thyroid   . Thyroid lobectomy   . Abdominal hysterectomy    Family History  Problem Relation Age of Onset  . Hypertension    . Breast cancer    . Prostate cancer    . Colon polyps    . Diabetes    . Heart disease    . Kidney disease    . Heart disease Mother   . Cancer Brother     prostate  . Cancer Other     niece - breast cancer   History   Social History  . Marital Status: Married    Spouse Name: N/A    Number of Children: N/A  . Years of Education: 12   Occupational History  . retired   .     Social History Main Topics  . Smoking status: Never Smoker   . Smokeless tobacco: Never Used  . Alcohol Use: No  . Drug Use: No  . Sexually Active: Not on file   Other Topics Concern  . Not on file   Social History Narrative   [name], Married. Lives with husband. Had been a very active woman.       Review of Systems System review is negative for any constitutional, cardiac, pulmonary, GI or neuro symptoms or complaints other than as described in the HPI.     Objective:   Physical Exam Filed Vitals:   10/09/11 1003  BP: 158/70  Pulse: 60  Temp: 97.9 F (36.6 C)  Resp: 16   Slender white woman in no  distress Cor- 2+ radial pulse Pulm - normal respirations. Neuro - A&O x 3, normal gait and station.       Assessment & Plan:

## 2011-10-12 NOTE — Assessment & Plan Note (Signed)
MIld elevations in BP on a complex medical regimen with multiple drugs. She has been ruled out for RAS BP Readings from Last 3 Encounters:  10/09/11 158/70  10/08/11 170/60  08/26/11 132/68   Plan  Home monitoring with changes to be based on average readings  Plan - monitor at home  -  Call if SBP consistently above 145, diastolics above 90

## 2011-10-29 ENCOUNTER — Other Ambulatory Visit: Payer: Self-pay | Admitting: *Deleted

## 2011-10-29 DIAGNOSIS — I10 Essential (primary) hypertension: Secondary | ICD-10-CM

## 2011-10-29 MED ORDER — OLMESARTAN MEDOXOMIL-HCTZ 40-12.5 MG PO TABS: 1.0000 | ORAL_TABLET | Freq: Every day | ORAL | Status: DC

## 2011-11-11 ENCOUNTER — Other Ambulatory Visit: Payer: Self-pay | Admitting: *Deleted

## 2011-11-11 MED ORDER — CARVEDILOL 12.5 MG PO TABS: 6.2500 mg | ORAL_TABLET | Freq: Two times a day (BID) | ORAL | Status: DC

## 2011-11-11 NOTE — Telephone Encounter (Signed)
Carvedilol sent to [facility]

## 2011-11-21 ENCOUNTER — Other Ambulatory Visit: Payer: Self-pay | Admitting: *Deleted

## 2011-11-21 MED ORDER — DILTIAZEM HCL ER COATED BEADS 300 MG PO CP24: 300.0000 mg | ORAL_CAPSULE | Freq: Every day | ORAL | Status: DC

## 2011-11-21 NOTE — Telephone Encounter (Signed)
Medication sent to [email or web address].diltiazem

## 2011-11-27 ENCOUNTER — Other Ambulatory Visit: Payer: Self-pay | Admitting: Internal Medicine

## 2011-11-27 DIAGNOSIS — M810 Age-related osteoporosis without current pathological fracture: Secondary | ICD-10-CM

## 2011-11-27 MED ORDER — ZOLEDRONIC ACID 5 MG/100ML IV SOLN: 5.0000 mg | Freq: Once | INTRAVENOUS | Status: DC

## 2011-11-28 ENCOUNTER — Encounter (HOSPITAL_COMMUNITY)
Admission: RE | Admit: 2011-11-28 | Discharge: 2011-11-28 | Disposition: A | Discharge status: Home / Self Care | Payer: Medicare Other | Source: Ambulatory Visit | Attending: Internal Medicine | Admitting: Internal Medicine

## 2011-11-28 ENCOUNTER — Encounter (HOSPITAL_COMMUNITY): Payer: Medicare Other

## 2011-11-28 ENCOUNTER — Other Ambulatory Visit (HOSPITAL_COMMUNITY): Payer: Self-pay | Admitting: *Deleted

## 2011-11-28 DIAGNOSIS — M810 Age-related osteoporosis without current pathological fracture: Secondary | ICD-10-CM | POA: Insufficient documentation

## 2011-11-28 MED ORDER — ZOLEDRONIC ACID 5 MG/100ML IV SOLN
5.0000 mg | Freq: Once | INTRAVENOUS | Status: AC
  Administered 2011-11-28: 5 mg via INTRAVENOUS

## 2012-01-05 ENCOUNTER — Telehealth: Payer: Self-pay | Admitting: Internal Medicine

## 2012-01-05 NOTE — Telephone Encounter (Signed)
Caller: Raysa/Patient; Patient Name: [name]; PCP: Illene Regulus (Adults only); Best Callback Phone Number: [phone] Calling about BP elevated on and off and recently feeling dizzy. BP= 178/61  pulse 59 0943, . BP =196/71 in L arm Pulse =70 @ 1200,  She is having some Epigastric pain- more frequent episodes especailly after she takes her meds. She had ear infection 2 months ago and sometimes ear still hurts on and off and feels "full". She has Allergies, eyes feel scratcy and look puffy. Triage and Care advice per Dizziness and Hypertension Protocol and appointment advised within 4 hours. PLEASE CALL HER BACK TO LET HER KNOW IF SHE CAN BE SEEN BY DR. Debby Bud TODAY OR IF SHE NEEDS TO BE SEEN IN UC OR ER.

## 2012-01-05 NOTE — Telephone Encounter (Signed)
H/o elevated BPs in the past. Current symptoms do not sound emergent. May add on to tomorrow's schedule.  Allergy - continue flonase 1 spray each nostril bid    Ok to take allegra or claritin to help with itchy eyes., etc

## 2012-01-05 NOTE — Telephone Encounter (Signed)
SPOKE WITH PATIENT. SHE IS DOING OK. WILL CONTINUE HER CURRENT MEDS. PATIENT TO CALL IN AM AND BE WORKED IN TO SEE DR. Debby Bud.

## 2012-01-07 ENCOUNTER — Encounter: Payer: Self-pay | Admitting: Internal Medicine

## 2012-01-07 ENCOUNTER — Ambulatory Visit (INDEPENDENT_AMBULATORY_CARE_PROVIDER_SITE_OTHER): Payer: Medicare Other | Admitting: Internal Medicine

## 2012-01-07 VITALS — BP 172/60 | HR 60 | Temp 98.4°F | Resp 16 | Wt 106.0 lb

## 2012-01-07 DIAGNOSIS — I10 Essential (primary) hypertension: Secondary | ICD-10-CM

## 2012-01-07 MED ORDER — OLMESARTAN MEDOXOMIL 40 MG PO TABS: 40.0000 mg | ORAL_TABLET | Freq: Every day | ORAL | Status: DC

## 2012-01-07 NOTE — Progress Notes (Signed)
Subjective:    Patient ID: [name], female    DOB: 22-Jan-1933, 78 y.o.   MRN: [id number]  HPI [name] presents for consultation in regard to her blood pressure. She is on 5(6) medications: CCB, BB, ARB,vasodilator and diuretic, and still she will have frequent excursions of elevated SBP. She has been ruled out for pheo, she had CTangio '09 with patent renal arteries. Her BP can drop to normal range for SBP but low DBP and in general she is feeling bad. Have reviewed all her medications and recent studies.   Past Medical History  Diagnosis Date  . Renal calculus   . PVD (peripheral vascular disease)   . Anemia, pernicious   . Atrophic gastritis   . Hypothyroidism   . HLD (hyperlipidemia)   . HTN (hypertension)   . CAD (coronary artery disease)   . Carotid artery stenosis   . Median arcuate ligament syndrome    Past Surgical History  Procedure Date  . Coronary artery bypass graft   . Appendectomy   . Hysterectomy - unknown type   . Biopsy thyroid   . Thyroid lobectomy   . Abdominal hysterectomy    Family History  Problem Relation Age of Onset  . Hypertension    . Breast cancer    . Prostate cancer    . Colon polyps    . Diabetes    . Heart disease    . Kidney disease    . Heart disease Mother   . Cancer Brother     prostate  . Cancer Other     niece - breast cancer   History   Social History  . Marital Status: Married    Spouse Name: N/A    Number of Children: N/A  . Years of Education: 12   Occupational History  . retired   .     Social History Main Topics  . Smoking status: Never Smoker   . Smokeless tobacco: Never Used  . Alcohol Use: No  . Drug Use: No  . Sexually Active: Not on file   Other Topics Concern  . Not on file   Social History Narrative   [name], Married. Lives with husband. Had been a very active woman.    Current Outpatient Prescriptions on File Prior to Visit  Medication Sig Dispense Refill  . aspirin 81 MG tablet Take  81 mg by mouth daily.        Marland Kitchen atorvastatin (LIPITOR) 80 MG tablet Take 1 tablet (80 mg total) by mouth daily.  30 tablet  11  . Calcium Citrate-Vitamin D (CITRACAL + D PO) Take by mouth daily.        . carvedilol (COREG) 12.5 MG tablet Take 0.5 tablets (6.25 mg total) by mouth 2 (two) times daily.  30 tablet  11  . cholecalciferol (VITAMIN D) 1000 UNITS tablet Take 2,000 Units by mouth daily.       . cyanocobalamin (,VITAMIN B-12,) 1000 MCG/ML injection Inject 1,000 mcg into the muscle every 30 (thirty) days.        Marland Kitchen diltiazem (CARDIZEM CD) 300 MG 24 hr capsule Take 1 capsule (300 mg total) by mouth daily.  30 capsule  11  . fluticasone (FLONASE) 50 MCG/ACT nasal spray Place 2 sprays into the nose daily as needed.  16 g  11  . furosemide (LASIX) 40 MG tablet Take 1 tablet (40 mg total) by mouth daily.  30 tablet  5  . hydrALAZINE (APRESOLINE) 25 MG  tablet Take 25 mg by mouth 3 (three) times daily.      Marland Kitchen levothyroxine (SYNTHROID, LEVOTHROID) 75 MCG tablet Take 75 mcg by mouth daily.        . Multiple Vitamins-Minerals (ICAPS) CAPS Take by mouth daily.        . ONE TOUCH ULTRA TEST test strip as directed.      . Potassium 99 MG TABS Take by mouth.      . sodium chloride (OCEAN) 0.65 % nasal spray Place 1 spray into the nose as needed.        . sucralfate (CARAFATE) 1 G tablet Take 1 g by mouth as needed.        . traMADol (ULTRAM) 50 MG tablet Take 50 mg by mouth every 6 (six) hours as needed.        . zoledronic acid (RECLAST) 5 MG/100ML SOLN Inject 100 mLs (5 mg total) into the vein once. Short Stay  100 mL  0  . olmesartan (BENICAR) 40 MG tablet Take 1 tablet (40 mg total) by mouth daily.  30 tablet  11      Review of Systems System review is negative for any constitutional, cardiac, pulmonary, GI or neuro symptoms or complaints other than as described in the HPI. Generally feels lousy.      Objective:   Physical Exam Filed Vitals:   01/07/12 1139  BP: 172/60  Pulse: 60  Temp:  98.4 F (36.9 C)  Resp: 16   BP Readings from Last 3 Encounters:  01/07/12 172/60  11/28/11 146/60  10/09/11 158/70   Gen'l very thin older woman in no distress HEENT - C&S clear Cor- RRR, no murmur Pulm - normal respirations Neuro - nonfocal       Assessment & Plan:

## 2012-01-07 NOTE — Assessment & Plan Note (Signed)
Difficult to manage on 6 agents.  Plan - change Benicar/Hct to Benicar   Continue all other meds   To see Dr. Excell Seltzer Friday September 20th

## 2012-01-07 NOTE — Patient Instructions (Addendum)
Blood pressure problems: running from too high to normal level (139) at which time you feel even worse. You are on 6 medications altogether.  Plan  continue your present medications except that the Benicar/Hct is changed to Benicar alone. Until you use up your Benicar/Hct do not take the    furosemide.  We will get Dr. Earmon Phoenix help in adjusting your medications. Appointment Friday, Sept 20th at 11:30 AM

## 2012-01-08 ENCOUNTER — Other Ambulatory Visit: Payer: Self-pay

## 2012-01-08 ENCOUNTER — Ambulatory Visit (HOSPITAL_COMMUNITY): Payer: Medicare Other | Attending: Cardiovascular Disease | Admitting: Radiology

## 2012-01-08 DIAGNOSIS — I369 Nonrheumatic tricuspid valve disorder, unspecified: Secondary | ICD-10-CM | POA: Insufficient documentation

## 2012-01-08 DIAGNOSIS — I10 Essential (primary) hypertension: Secondary | ICD-10-CM | POA: Insufficient documentation

## 2012-01-08 DIAGNOSIS — I080 Rheumatic disorders of both mitral and aortic valves: Secondary | ICD-10-CM | POA: Insufficient documentation

## 2012-01-08 DIAGNOSIS — I739 Peripheral vascular disease, unspecified: Secondary | ICD-10-CM | POA: Insufficient documentation

## 2012-01-08 DIAGNOSIS — I340 Nonrheumatic mitral (valve) insufficiency: Secondary | ICD-10-CM

## 2012-01-08 DIAGNOSIS — I2510 Atherosclerotic heart disease of native coronary artery without angina pectoris: Secondary | ICD-10-CM | POA: Insufficient documentation

## 2012-01-08 DIAGNOSIS — R011 Cardiac murmur, unspecified: Secondary | ICD-10-CM | POA: Insufficient documentation

## 2012-01-09 NOTE — Progress Notes (Signed)
Echocardiogram performed on 01/08/12.  

## 2012-01-16 ENCOUNTER — Ambulatory Visit (INDEPENDENT_AMBULATORY_CARE_PROVIDER_SITE_OTHER): Payer: Medicare Other | Admitting: Cardiovascular Disease

## 2012-01-16 ENCOUNTER — Encounter: Payer: Self-pay | Admitting: Cardiovascular Disease

## 2012-01-16 VITALS — BP 138/54 | HR 58 | Resp 18 | Ht 64.0 in | Wt 108.8 lb

## 2012-01-16 DIAGNOSIS — I10 Essential (primary) hypertension: Secondary | ICD-10-CM

## 2012-01-16 NOTE — Progress Notes (Signed)
HPI:  78 year old woman presenting for followup evaluation. The patient has coronary artery disease status post CABG in 2007. She also has asymptomatic carotid stenosis and moderate mitral regurgitation. She's had a lot of difficulty with blood pressure control. She does not feel well and she attributes this to her medication. She feels tired and notes a general lack of energy. Her blood pressure has been all over the board. She has an occasional chest discomfort with inspiration. She does not have exertional chest pain or pressure. She has mild dyspnea with exertion. She denies edema, orthopnea, PND, or palpitations.  Outpatient Encounter Prescriptions as of 01/16/2012  Medication Sig Dispense Refill  . aspirin 81 MG tablet Take 81 mg by mouth daily.        Marland Kitchen atorvastatin (LIPITOR) 80 MG tablet Take 1 tablet (80 mg total) by mouth daily.  30 tablet  11  . Calcium Citrate-Vitamin D (CITRACAL + D PO) Take by mouth daily.        . carvedilol (COREG) 12.5 MG tablet Take 0.5 tablets (6.25 mg total) by mouth 2 (two) times daily.  30 tablet  11  . cholecalciferol (VITAMIN D) 1000 UNITS tablet Take 2,000 Units by mouth daily.       . cyanocobalamin (,VITAMIN B-12,) 1000 MCG/ML injection Inject 1,000 mcg into the muscle every 30 (thirty) days.        Marland Kitchen diltiazem (CARDIZEM CD) 300 MG 24 hr capsule Take 1 capsule (300 mg total) by mouth daily.  30 capsule  11  . fluticasone (FLONASE) 50 MCG/ACT nasal spray Place 2 sprays into the nose daily as needed.  16 g  11  . hydrALAZINE (APRESOLINE) 25 MG tablet Take 25 mg by mouth 3 (three) times daily.      Marland Kitchen levothyroxine (SYNTHROID, LEVOTHROID) 75 MCG tablet Take 75 mcg by mouth daily.        . Multiple Vitamins-Minerals (ICAPS) CAPS Take by mouth daily.        Marland Kitchen olmesartan (BENICAR) 40 MG tablet Take 1 tablet (40 mg total) by mouth daily.  30 tablet  11  . ONE TOUCH ULTRA TEST test strip as directed.      . Potassium 99 MG TABS Take by mouth.      . sodium  chloride (OCEAN) 0.65 % nasal spray Place 1 spray into the nose as needed.        . sucralfate (CARAFATE) 1 G tablet Take 1 g by mouth as needed.        . traMADol (ULTRAM) 50 MG tablet Take 50 mg by mouth every 6 (six) hours as needed.        . zoledronic acid (RECLAST) 5 MG/100ML SOLN Inject 100 mLs (5 mg total) into the vein once. Short Stay  100 mL  0  . furosemide (LASIX) 40 MG tablet Take 1 tablet (40 mg total) by mouth daily.  30 tablet  5    Allergies  Allergen Reactions  . Codeine   . Sulfonamide Derivatives     Past Medical History  Diagnosis Date  . Renal calculus   . PVD (peripheral vascular disease)   . Anemia, pernicious   . Atrophic gastritis   . Hypothyroidism   . HLD (hyperlipidemia)   . HTN (hypertension)   . CAD (coronary artery disease)   . Carotid artery stenosis   . Median arcuate ligament syndrome     ROS: Negative except as per HPI  BP 138/54  Pulse 58  Resp  18  Ht 5\' 4"  (1.626 m)  Wt 49.351 kg (108 lb 12.8 oz)  BMI 18.68 kg/m2  SpO2 96%  PHYSICAL EXAM: Pt is alert and oriented, pleasant, thin woman in NAD HEENT: normal Neck: JVP - normal, carotids 2+= without bruits Lungs: CTA bilaterally CV: RRR with grade 2/6 holosystolic murmur best heard at the apex, paradoxical splitting of S2  Abd: soft, NT, Positive BS, no hepatomegaly Ext: no C/C/E, distal pulses intact and equal Skin: warm/dry no rash  EKG:  Sinus bradycardia 51 beats per minute, right axis deviation, nonspecific intraventricular block.  ASSESSMENT AND PLAN: 1. Malignant hypertension, difficult control. The patient has a wide pulse pressure. Her blood pressure reading today is good, but home readings have not been well-controlled. We discussed proper technique for measuring blood pressure. She has not been sitting down and resting for 5 minutes before blood pressure readings and I think this will be important for her to do so. We may be over medicating her. For now, she will continue  on combination of carvedilol, diltiazem, hydralazine, and Benicar/HCT. She will record about 3 weeks of blood pressure readings and then will bring these in for review. We'll make further adjustments based on her home readings. I will see her back in scheduled office followup in 3 months.  2. CAD status post CABG. No anginal symptoms at present. Her chest pain is pleuritic and atypical. I think she should continue on aspirin 81 mg and Lipitor for secondary risk reduction.  3. Asymptomatic carotid stenosis. Continue serial carotid ultrasounds at 12 month intervals. Her last carotid duplex from 2012 was reviewed.  4. Mitral regurgitation. Her recent echo results were reviewed and we will continue a period of observation. Probably will followup with a repeat echo next year.  Tonny Bollman 01/16/2012 5:29 PM

## 2012-01-16 NOTE — Patient Instructions (Addendum)
Your physician wants you to follow-up in:  3 months. You will receive a reminder letter in the mail two months in advance. If you don't receive a letter, please call our office to schedule the follow-up appointment.  Please check blood pressure 4-5 days per week.  Keep record of readings and call to let us know what they are in 3-4 weeks.

## 2012-01-21 ENCOUNTER — Other Ambulatory Visit: Payer: Self-pay | Admitting: *Deleted

## 2012-01-26 ENCOUNTER — Other Ambulatory Visit: Payer: Self-pay | Admitting: *Deleted

## 2012-01-26 MED ORDER — HYDRALAZINE HCL 25 MG PO TABS: 25.0000 mg | ORAL_TABLET | Freq: Three times a day (TID) | ORAL | Status: DC

## 2012-02-24 ENCOUNTER — Other Ambulatory Visit: Payer: Self-pay

## 2012-02-24 MED ORDER — CARVEDILOL 12.5 MG PO TABS: 12.5000 mg | ORAL_TABLET | Freq: Two times a day (BID) | ORAL | Status: DC

## 2012-03-31 ENCOUNTER — Other Ambulatory Visit: Payer: Self-pay | Admitting: *Deleted

## 2012-03-31 MED ORDER — HYDRALAZINE HCL 25 MG PO TABS: 25.0000 mg | ORAL_TABLET | Freq: Three times a day (TID) | ORAL | Status: DC

## 2012-04-02 ENCOUNTER — Telehealth: Payer: Self-pay | Admitting: Cardiovascular Disease

## 2012-04-02 NOTE — Telephone Encounter (Signed)
Walk in pt Form " Pt Dropped off BP readings" Sent to Lauren/Cooper 04/02/12/KM

## 2012-04-15 ENCOUNTER — Encounter: Payer: Self-pay | Admitting: Cardiovascular Disease

## 2012-04-15 ENCOUNTER — Ambulatory Visit (INDEPENDENT_AMBULATORY_CARE_PROVIDER_SITE_OTHER): Payer: Medicare Other | Admitting: Cardiovascular Disease

## 2012-04-15 VITALS — BP 154/46 | HR 57 | Ht 64.0 in | Wt 107.0 lb

## 2012-04-15 DIAGNOSIS — I10 Essential (primary) hypertension: Secondary | ICD-10-CM

## 2012-04-15 DIAGNOSIS — I2510 Atherosclerotic heart disease of native coronary artery without angina pectoris: Secondary | ICD-10-CM

## 2012-04-15 NOTE — Patient Instructions (Addendum)
Your physician wants you to follow-up in: 6 MONTHS with Dr Excell Seltzer.  You will receive a reminder letter in the mail two months in advance. If you don't receive a letter, please call our office to schedule the follow-up appointment.  Your physician has requested that you have a carotid duplex in 6 MONTHS. This test is an ultrasound of the carotid arteries in your neck. It looks at blood flow through these arteries that supply the brain with blood. Allow one hour for this exam. There are no restrictions or special instructions.  Your physician recommends that you continue on your current medications as directed. Please refer to the Current Medication list given to you today.

## 2012-04-15 NOTE — Progress Notes (Signed)
HPI:  [name] returns for followup evaluation. She is a 78 year old woman who was seen last in September. She has a history of coronary artery disease status post CABG in 2007. She's also followed for asymptomatic carotid stenosis and moderate mitral regurgitation. She's had malignant hypertension and has had some difficulty tolerating antihypertensive medicines. She has a wide pulse pressure.  Overall she is doing fairly well at the present time. She brought in blood pressure readings last week and they look pretty good. She denies chest pain. She has some shortness of breath unchanged over time. She coughs when she takes deep breaths. She denies orthopnea, PND, or edema. She has no exertional chest discomfort. She denies palpitations or syncope, but has occasional lightheadedness when standing.  Outpatient Encounter Prescriptions as of 04/15/2012  Medication Sig Dispense Refill  . aspirin 81 MG tablet Take 81 mg by mouth daily.        Marland Kitchen atorvastatin (LIPITOR) 80 MG tablet Take 1 tablet (80 mg total) by mouth daily.  30 tablet  11  . Calcium Citrate-Vitamin D (CITRACAL + D PO) Take by mouth daily.        . carvedilol (COREG) 12.5 MG tablet Take 1 tablet (12.5 mg total) by mouth 2 (two) times daily with a meal.  60 tablet  11  . cholecalciferol (VITAMIN D) 1000 UNITS tablet Take 2,000 Units by mouth daily.       . cyanocobalamin (,VITAMIN B-12,) 1000 MCG/ML injection Inject 1,000 mcg into the muscle every 30 (thirty) days.        Marland Kitchen diltiazem (CARDIZEM CD) 300 MG 24 hr capsule Take 1 capsule (300 mg total) by mouth daily.  30 capsule  11  . fluticasone (FLONASE) 50 MCG/ACT nasal spray Place 2 sprays into the nose daily as needed.  16 g  11  . furosemide (LASIX) 40 MG tablet Take 1 tablet (40 mg total) by mouth daily.  30 tablet  5  . hydrALAZINE (APRESOLINE) 25 MG tablet Take 1 tablet (25 mg total) by mouth 3 (three) times daily.  90 tablet  1  . levothyroxine (SYNTHROID, LEVOTHROID) 75 MCG  tablet Take 75 mcg by mouth daily.        . Multiple Vitamins-Minerals (ICAPS) CAPS Take by mouth daily.        Marland Kitchen olmesartan (BENICAR) 40 MG tablet Take 1 tablet (40 mg total) by mouth daily.  30 tablet  11  . ONE TOUCH ULTRA TEST test strip as directed.      . Potassium 99 MG TABS Take by mouth.      . sodium chloride (OCEAN) 0.65 % nasal spray Place 1 spray into the nose as needed.        . sucralfate (CARAFATE) 1 G tablet Take 1 g by mouth as needed.        . traMADol (ULTRAM) 50 MG tablet Take 50 mg by mouth every 6 (six) hours as needed.        . zoledronic acid (RECLAST) 5 MG/100ML SOLN Inject 100 mLs (5 mg total) into the vein once. Short Stay  100 mL  0    Allergies  Allergen Reactions  . Codeine   . Sulfonamide Derivatives     Past Medical History  Diagnosis Date  . Renal calculus   . PVD (peripheral vascular disease)   . Anemia, pernicious   . Atrophic gastritis   . Hypothyroidism   . HLD (hyperlipidemia)   . HTN (hypertension)   .  CAD (coronary artery disease)   . Carotid artery stenosis   . Median arcuate ligament syndrome     ROS: Negative except as per HPI  BP 154/46  Pulse 57  Ht 5\' 4"  (1.626 m)  Wt 48.535 kg (107 lb)  BMI 18.37 kg/m2  SpO2 99%  PHYSICAL EXAM: Pt is alert and oriented, NAD HEENT: normal Neck: JVP - normal, carotids 2+= without bruits Lungs: CTA bilaterally CV: RRR with grade 2/6 holosystolic murmur at the apex and grade 2/6 systolic ejection murmur at the base Abd: soft, NT, Positive BS, no hepatomegaly Ext: no C/C/E, distal pulses intact and equal Skin: warm/dry no rash  Echo (9/122013):  Study Conclusions  - Left ventricle: The cavity size was normal. Wall thickness was normal. Systolic function was normal. The estimated ejection fraction was in the range of 55% to 60%. - Aortic valve: Moderate regurgitation. - Mitral valve: Moderate regurgitation. - Left atrium: The atrium was mildly dilated. - Pulmonary arteries: PA peak  pressure: 42mm Hg (S).  ASSESSMENT AND PLAN: 1. CAD status post CABG. The patient is stable without anginal symptoms. She will be continued on her current medical program which includes aspirin for antiplatelet therapy, high-dose atorvastatin, and multiple antihypertensive agents.  2. Malignant hypertension. Blood pressure control is reasonably good. I think we have struck a good balance of blood pressure control and tolerance of medication. I would like to see her back in 6 months for followup.  3. Carotid stenosis, asymptomatic. Followup carotid duplex scan when she returns for her six-month office visit.  4. Moderate mitral regurgitation and aortic insufficiency. Followup echo in September 2014 which will be a one year interval study.  [name] 04/15/2012 2:55 PM

## 2012-04-16 ENCOUNTER — Ambulatory Visit (INDEPENDENT_AMBULATORY_CARE_PROVIDER_SITE_OTHER): Payer: Medicare Other | Admitting: Internal Medicine

## 2012-04-16 ENCOUNTER — Encounter: Payer: Self-pay | Admitting: Internal Medicine

## 2012-04-16 VITALS — BP 118/52 | HR 52 | Temp 98.1°F | Ht 64.0 in | Wt 106.2 lb

## 2012-04-16 DIAGNOSIS — I10 Essential (primary) hypertension: Secondary | ICD-10-CM

## 2012-04-16 MED ORDER — MECLIZINE HCL 12.5 MG PO TABS: 12.5000 mg | ORAL_TABLET | Freq: Three times a day (TID) | ORAL | Status: DC | PRN

## 2012-04-16 NOTE — Progress Notes (Signed)
Subjective:    Patient ID: [name], female    DOB: 01/25/1933, 78 y.o.   MRN: [id number]  HPI [name] presents acutely for sudden on-set of dizziness. She reports that this is worse with change of position, neck flexion or rotation makes it worse. She has had some discomfort in the neck and the left ear. She has just a minor scratchy throat. She has been coughing with deep inspirations but no wheezing and no SOB  She saw Dr. Excell Seltzer 12/19 and is stable from a cardiac perspective and in regard to her  BP  PMH, FamHx and SocHx reviewed for any changes and relevance. Current Outpatient Prescriptions on File Prior to Visit  Medication Sig Dispense Refill  . aspirin 81 MG tablet Take 81 mg by mouth daily.        Marland Kitchen atorvastatin (LIPITOR) 80 MG tablet Take 1 tablet (80 mg total) by mouth daily.  30 tablet  11  . Calcium Citrate-Vitamin D (CITRACAL + D PO) Take by mouth daily.        . carvedilol (COREG) 12.5 MG tablet Take 1 tablet (12.5 mg total) by mouth 2 (two) times daily with a meal.  60 tablet  11  . cholecalciferol (VITAMIN D) 1000 UNITS tablet Take 2,000 Units by mouth daily.       . cyanocobalamin (,VITAMIN B-12,) 1000 MCG/ML injection Inject 1,000 mcg into the muscle every 30 (thirty) days.        Marland Kitchen diltiazem (CARDIZEM CD) 300 MG 24 hr capsule Take 1 capsule (300 mg total) by mouth daily.  30 capsule  11  . fluticasone (FLONASE) 50 MCG/ACT nasal spray Place 2 sprays into the nose daily as needed.  16 g  11  . furosemide (LASIX) 40 MG tablet Take 1 tablet (40 mg total) by mouth daily.  30 tablet  5  . hydrALAZINE (APRESOLINE) 25 MG tablet Take 1 tablet (25 mg total) by mouth 3 (three) times daily.  90 tablet  1  . levothyroxine (SYNTHROID, LEVOTHROID) 75 MCG tablet Take 75 mcg by mouth daily.        . Multiple Vitamins-Minerals (ICAPS) CAPS Take by mouth daily.        Marland Kitchen olmesartan (BENICAR) 40 MG tablet Take 1 tablet (40 mg total) by mouth daily.  30 tablet  11  . ONE TOUCH  ULTRA TEST test strip as directed.      . Potassium 99 MG TABS Take by mouth.      . sodium chloride (OCEAN) 0.65 % nasal spray Place 1 spray into the nose as needed.        . sucralfate (CARAFATE) 1 G tablet Take 1 g by mouth as needed.        . traMADol (ULTRAM) 50 MG tablet Take 50 mg by mouth every 6 (six) hours as needed.        . zoledronic acid (RECLAST) 5 MG/100ML SOLN Inject 100 mLs (5 mg total) into the vein once. Short Stay  100 mL  0       Review of Systems    System review is negative for any constitutional, cardiac, pulmonary, GI or neuro symptoms or complaints other than as described in the HPI.  Objective:   Physical Exam Filed Vitals:   04/16/12 1541  BP: 118/52  Pulse: 52  Temp: 98.1 F (36.7 C)   gen'l - thin white woman in no distress HEENT - TM left is normal but poor movement with insufflation.  Minor sinus tenderness to percussion. Cor - RRR Pulm - normal respirations, no rales or wheezes, no increased WOB. Neuro - A&O x 3, XCN II-XII normal, no tremor, nl gait       Assessment & Plan:  1. Dizziness - normal exam with no sign of any neurologic abnormality. Suspect this is labyrinthitis = inner ear. Plan Meclizine 12.5 mg every 6 hours as needed  2. Sinus congestion and eustachian tube dysfunction giving sinus pressure and fullness in the ear. Plan Behind the counter Sudafed (generic is fine) 30 mg twice a day.

## 2012-04-16 NOTE — Patient Instructions (Addendum)
Dizziness - normal exam with no sign of any neurologic abnormality. Suspect this is labyrinthitis = inner ear. Plan Meclizine 12.5 mg every 6 hours as needed  Sinus congestion and eustachian tube dysfunction giving sinus pressure and fullness in the ear. Plan Behind the counter Sudafed (generic is fine) 30 mg twice a day.  Blood pressure is great - continue present medication.   Labyrinthitis (Inner Ear Inflammation) Your exam shows you have an inner ear disturbance or labyrinthitis. The cause of this condition is not known. But it may be due to a virus infection. The symptoms of labyrinthitis include vertigo or dizziness made worse by motion, nausea and vomiting. The onset of labyrinthitis may be very sudden. It usually lasts for a few days and then clears up over 1-2 weeks. The treatment of an inner ear disturbance includes bed rest and medications to reduce dizziness, nausea, and vomiting. You should stay away from alcohol, tranquilizers, caffeine, nicotine, or any medicine your doctor thinks may make your symptoms worse. Further testing may be needed to evaluate your hearing and balance system. Please see your doctor or go to the emergency room right away if you have:  Increasing vertigo, earache, loss of hearing, or ear drainage.   Headache, blurred vision, trouble walking, fainting, or fever.   Persistent vomiting, dehydration, or extreme weakness.  Document Released: 04/14/2005 Document Revised: 07/07/2011 Document Reviewed: 09/30/2006 [facility] Patient Information 2013 [place], [place].

## 2012-04-17 ENCOUNTER — Ambulatory Visit: Payer: Medicare Other | Admitting: Family Medicine

## 2012-04-22 NOTE — Assessment & Plan Note (Signed)
BP Readings from Last 3 Encounters:  04/16/12 118/52  04/15/12 154/46  01/16/12 138/54   Good control.  Plan - continue present medications

## 2012-04-23 ENCOUNTER — Ambulatory Visit (INDEPENDENT_AMBULATORY_CARE_PROVIDER_SITE_OTHER): Payer: Medicare Other | Admitting: Internal Medicine

## 2012-04-23 ENCOUNTER — Encounter: Payer: Self-pay | Admitting: Internal Medicine

## 2012-04-23 VITALS — BP 132/60 | HR 61 | Temp 97.8°F

## 2012-04-23 DIAGNOSIS — R7309 Other abnormal glucose: Secondary | ICD-10-CM

## 2012-04-23 DIAGNOSIS — R7303 Prediabetes: Secondary | ICD-10-CM

## 2012-04-23 DIAGNOSIS — J019 Acute sinusitis, unspecified: Secondary | ICD-10-CM

## 2012-04-23 DIAGNOSIS — J9801 Acute bronchospasm: Secondary | ICD-10-CM

## 2012-04-23 MED ORDER — BENZONATATE 200 MG PO CAPS: 200.0000 mg | ORAL_CAPSULE | Freq: Three times a day (TID) | ORAL | Status: DC | PRN

## 2012-04-23 MED ORDER — LEVOFLOXACIN 500 MG PO TABS: 500.0000 mg | ORAL_TABLET | Freq: Every day | ORAL | Status: DC

## 2012-04-23 NOTE — Patient Instructions (Addendum)
It was good to see you today. Levaquin antibiotics and prescription cough medication - Your prescription(s) have been submitted to your pharmacy. Please take as directed and contact our office if you believe you are having problem(s) with the medication(s). If cough unimproved in next 72h, call to reconsider steroids as discussed Alternate between ibuprofen and tylenol for aches, pain and fever symptoms as discussed Hydrate, rest and call if worse or unimproved

## 2012-04-23 NOTE — Progress Notes (Signed)
  Subjective:    HPI  complains of head cold symptoms, ?sinusitus Onset >1 week ago, initially improved then relapsing and worse symptoms  First associated with rhinorrhea, sneezing, sore throat, mild headache and fatigue Now increased sinus pressure and mild-mod chest congestion, yellow-green discharge from chest and nasal No relief with OTC meds -robitussin Precipitated by sick contacts and weather change  Past Medical History  Diagnosis Date  . Renal calculus   . PVD (peripheral vascular disease)   . Anemia, pernicious   . Atrophic gastritis   . Hypothyroidism   . HLD (hyperlipidemia)   . HTN (hypertension)   . CAD (coronary artery disease)   . Carotid artery stenosis   . Median arcuate ligament syndrome     Review of Systems Constitutional: No night sweats, no unexpected weight change Pulmonary: No pleurisy or hemoptysis Cardiovascular: No chest pain or palpitations     Objective:   Physical Exam BP 132/60  Pulse 61  Temp 97.8 F (36.6 C) (Oral)  SpO2 97% GEN: mildly ill appearing and audible chest congestion, croupy cough spells HENT: NCAT, mild sinus tenderness bilaterally, nares with thick discharge and turbinate swelling, oropharynx mild erythema and PND, no exudate Eyes: Vision grossly intact, no conjunctivitis Lungs: few scattered rhonchi and soft end exp wheeze, no increased work of breathing Cardiovascular: Regular rate and rhythm, no bilateral edema  Lab Results  Component Value Date   WBC 5.1 02/05/2009   HGB 11.8* 02/05/2009   HCT 35.2* 02/05/2009   PLT 168.0 02/05/2009   GLUCOSE 89 06/13/2010   CHOL 155 10/09/2011   TRIG 46.0 10/09/2011   HDL 88.50 10/09/2011   LDLCALC 57 10/09/2011   ALT 18 10/09/2011   AST 26 10/09/2011   NA 143 06/13/2010   K 3.8 06/13/2010   CL 107 06/13/2010   CREATININE 0.8 07/15/2010   BUN 20 06/13/2010   CO2 29 06/13/2010   TSH 0.62 02/05/2009   HGBA1C 6.0 08/18/2008     Assessment & Plan:  Viral URI > progression to  acute sinusitis Cough, postnasal drip related to above + bronchospasm Dizziness OV 12/20 reviewed - symptoms have improved    Empiric antibiotics prescribed due to symptom duration greater than 7 days and progression despite OTC symptomatic care Consider steroids due to bronchospasm cough if unimproved with antibiotics and tessalon (hx same with allergies giving quick response but would like to avoid same due to glucose intolerance) Prescription cough suppression - new prescriptions done Symptomatic care with Tylenol or Advil, decongestants, antihistamine, hydration and rest -  Saline irrigation and salt gargle advised as needed

## 2012-05-07 ENCOUNTER — Other Ambulatory Visit: Payer: Self-pay | Admitting: *Deleted

## 2012-05-07 MED ORDER — ATORVASTATIN CALCIUM 80 MG PO TABS: 80.0000 mg | ORAL_TABLET | Freq: Every day | ORAL | Status: DC

## 2012-06-21 ENCOUNTER — Other Ambulatory Visit: Payer: Self-pay | Admitting: General Practice

## 2012-06-21 MED ORDER — HYDRALAZINE HCL 25 MG PO TABS: 25.0000 mg | ORAL_TABLET | Freq: Three times a day (TID) | ORAL | Status: DC

## 2012-06-21 NOTE — Telephone Encounter (Signed)
Med filled.  

## 2012-06-28 ENCOUNTER — Encounter: Payer: Self-pay | Admitting: Internal Medicine

## 2012-06-28 ENCOUNTER — Ambulatory Visit (INDEPENDENT_AMBULATORY_CARE_PROVIDER_SITE_OTHER): Payer: Medicare Other | Admitting: Internal Medicine

## 2012-06-28 VITALS — BP 128/68 | HR 60 | Temp 98.5°F | Resp 10 | Wt 110.0 lb

## 2012-06-28 DIAGNOSIS — J309 Allergic rhinitis, unspecified: Secondary | ICD-10-CM

## 2012-06-28 DIAGNOSIS — M26629 Arthralgia of temporomandibular joint, unspecified side: Secondary | ICD-10-CM

## 2012-06-28 DIAGNOSIS — I10 Essential (primary) hypertension: Secondary | ICD-10-CM

## 2012-06-28 DIAGNOSIS — M2669 Other specified disorders of temporomandibular joint: Secondary | ICD-10-CM

## 2012-06-28 DIAGNOSIS — R32 Unspecified urinary incontinence: Secondary | ICD-10-CM

## 2012-06-28 NOTE — Progress Notes (Signed)
Subjective:    Patient ID: [name], female    DOB: [birth year], 79 y.o.   MRN: [id number]  HPI [name] continues to have trouble with left ear pain despite use of flonase and claritin. She will have some pain that involves the neck and towards the jaw.  She is having urinary incontinence: a long standing problem. She will have spontaneous leakage without warning. She had been on medication for OAB in the past without results/improvement  Past Medical History  Diagnosis Date  . Renal calculus   . PVD (peripheral vascular disease)   . Anemia, pernicious   . Atrophic gastritis   . Hypothyroidism   . HLD (hyperlipidemia)   . HTN (hypertension)   . CAD (coronary artery disease)   . Carotid artery stenosis   . Median arcuate ligament syndrome   . Arthritis     bursitis    Past Surgical History  Procedure Laterality Date  . Coronary artery bypass graft    . Appendectomy    . Hysterectomy - unknown type    . Biopsy thyroid    . Thyroid lobectomy    . Abdominal hysterectomy     Family History  Problem Relation Age of Onset  . Hypertension    . Breast cancer    . Prostate cancer    . Colon polyps    . Diabetes    . Heart disease    . Kidney disease    . Heart disease Mother   . Cancer Brother     prostate  . Cancer Other     niece - breast cancer   History   Social History  . Marital Status: Married    Spouse Name: N/A    Number of Children: N/A  . Years of Education: 12   Occupational History  . retired   .     Social History Main Topics  . Smoking status: Never Smoker   . Smokeless tobacco: Never Used  . Alcohol Use: No  . Drug Use: No  . Sexually Active: Not on file   Other Topics Concern  . Not on file   Social History Narrative   [name], Married. Lives with husband. Had been a very active woman.    Current Outpatient Prescriptions on File Prior to Visit  Medication Sig Dispense Refill  . aspirin 81 MG tablet Take 81 mg by mouth daily.         Marland Kitchen atorvastatin (LIPITOR) 80 MG tablet Take 1 tablet (80 mg total) by mouth daily.  30 tablet  11  . benzonatate (TESSALON) 200 MG capsule Take 1 capsule (200 mg total) by mouth 3 (three) times daily as needed for cough.  30 capsule  1  . Calcium Citrate-Vitamin D (CITRACAL + D PO) Take by mouth daily.        . carvedilol (COREG) 12.5 MG tablet Take 1 tablet (12.5 mg total) by mouth 2 (two) times daily with a meal.  60 tablet  11  . cholecalciferol (VITAMIN D) 1000 UNITS tablet Take 2,000 Units by mouth daily.       . cyanocobalamin (,VITAMIN B-12,) 1000 MCG/ML injection Inject 1,000 mcg into the muscle every 30 (thirty) days.        Marland Kitchen diltiazem (CARDIZEM CD) 300 MG 24 hr capsule Take 1 capsule (300 mg total) by mouth daily.  30 capsule  11  . fluticasone (FLONASE) 50 MCG/ACT nasal spray Place 2 sprays into the nose daily as needed.  16 g  11  . furosemide (LASIX) 40 MG tablet Take 1 tablet (40 mg total) by mouth daily.  30 tablet  5  . hydrALAZINE (APRESOLINE) 25 MG tablet Take 1 tablet (25 mg total) by mouth 3 (three) times daily.  90 tablet  1  . levothyroxine (SYNTHROID, LEVOTHROID) 75 MCG tablet Take 75 mcg by mouth daily.        . meclizine (ANTIVERT) 12.5 MG tablet Take 1 tablet (12.5 mg total) by mouth 3 (three) times daily as needed.  30 tablet  0  . Multiple Vitamins-Minerals (ICAPS) CAPS Take by mouth daily.        Marland Kitchen olmesartan (BENICAR) 40 MG tablet Take 1 tablet (40 mg total) by mouth daily.  30 tablet  11  . ONE TOUCH ULTRA TEST test strip as directed.      . Potassium 99 MG TABS Take by mouth.      . sodium chloride (OCEAN) 0.65 % nasal spray Place 1 spray into the nose as needed.        . sucralfate (CARAFATE) 1 G tablet Take 1 g by mouth as needed.        . traMADol (ULTRAM) 50 MG tablet Take 50 mg by mouth every 6 (six) hours as needed.        . zoledronic acid (RECLAST) 5 MG/100ML SOLN Inject 100 mLs (5 mg total) into the vein once. Short Stay  100 mL  0   No current  facility-administered medications on file prior to visit.      Review of Systems System review is negative for any constitutional, cardiac, pulmonary, GI or neuro symptoms or complaints other than as described in the HPI.     Objective:   Physical Exam Filed Vitals:   06/28/12 0942  BP: 128/68  Pulse: 60  Temp: 98.5 F (36.9 C)  Resp: 10   Gen'l - very thin white woman in no distress HEENT - EAC left is clear, TM appears normal. Tender at the left TMJ to pressure. There is crepitus in this joint. Nodes - no adenopathy. Cor - RRR Pulm - lungs are clear       Assessment & Plan:  1. Urinary incontinence - it has been several years since any medication for over active bladder was tried.  Plan  trial of Myrbetriq 25 mg once a day - if it helps the diagnosis if made = overactive bladder  If myrbetriq does not work will refer to Urology for better and more detailed evaluation of bladder function.

## 2012-06-28 NOTE — Assessment & Plan Note (Signed)
BP Readings from Last 3 Encounters:  06/28/12 128/68  04/23/12 132/60  04/16/12 118/52   Good control on present medication.

## 2012-06-28 NOTE — Assessment & Plan Note (Signed)
Good control of symptoms on present regimen.

## 2012-06-28 NOTE — Patient Instructions (Addendum)
1. Tender on exam at the left TMJ. Pain history and distribution is consistent with TMJ inflammation.  Plan Aspercreme to external joint  Follow up with dentist to rule out malocclusion or need for bite block  NSAID of choice if needed  Small bites.   2. Urinary incontinence - it has been several years since any medication for over active bladder was tried.  Plan  trial of Myrbetriq 25 mg once a day - if it helps the diagnosis if made = overactive bladder  If myrbetriq does not work will refer to Urology for better and more detailed evaluation of bladder function.   Temporomandibular Problems  Temporomandibular joint (TMJ) dysfunction means there are problems with the joint between your jaw and your skull. This is a joint lined by cartilage like other joints in your body but also has a small disc in the joint which keeps the bones from rubbing on each other. These joints are like other joints and can get inflamed (sore) from arthritis and other problems. When this joint gets sore, it can cause headaches and pain in the jaw and the face. CAUSES  Usually the arthritic types of problems are caused by soreness in the joint. Soreness in the joint can also be caused by overuse. This may come from grinding your teeth. It may also come from mis-alignment in the joint. DIAGNOSIS Diagnosis of this condition can often be made by history and exam. Sometimes your caregiver may need X-rays or an MRI scan to determine the exact cause. It may be necessary to see your dentist to determine if your teeth and jaws are lined up correctly. TREATMENT  Most of the time this problem is not serious; however, sometimes it can persist (become chronic). When this happens medications that will cut down on inflammation (soreness) help. Sometimes a shot of cortisone into the joint will be helpful. If your teeth are not aligned it may help for your dentist to make a splint for your mouth that can help this problem. If no physical  problems can be found, the problem may come from tension. If tension is found to be the cause, biofeedback or relaxation techniques may be helpful. HOME CARE INSTRUCTIONS   Later in the day, applications of ice packs may be helpful. Ice can be used in a plastic bag with a towel around it to prevent frostbite to skin. This may be used about every 2 hours for 20 to 30 minutes, as needed while awake, or as directed by your caregiver.  Only take over-the-counter or prescription medicines for pain, discomfort, or fever as directed by your caregiver.  If physical therapy was prescribed, follow your caregiver's directions.  Wear mouth appliances as directed if they were given. Document Released: 01/07/2001 Document Revised: 07/07/2011 Document Reviewed: 04/16/2008 [facility] Patient Information 2013 [place], [place].

## 2012-06-28 NOTE — Assessment & Plan Note (Signed)
Tender on exam at the left TMJ. Pain history and distribution is consistent with TMJ inflammation.  Plan Aspercreme to external joint  Follow up with dentist to rule out malocclusion or need for bite block  NSAID of choice if needed  Small bites.

## 2012-07-07 ENCOUNTER — Other Ambulatory Visit: Payer: Self-pay

## 2012-07-07 MED ORDER — FUROSEMIDE 40 MG PO TABS: 40.0000 mg | ORAL_TABLET | Freq: Every day | ORAL | Status: DC

## 2012-07-09 ENCOUNTER — Encounter: Payer: Self-pay | Admitting: Gastroenterology

## 2012-07-27 ENCOUNTER — Encounter: Payer: Self-pay | Admitting: Gastroenterology

## 2012-07-27 ENCOUNTER — Ambulatory Visit (INDEPENDENT_AMBULATORY_CARE_PROVIDER_SITE_OTHER): Payer: Medicare Other | Admitting: Gastroenterology

## 2012-07-27 ENCOUNTER — Other Ambulatory Visit (INDEPENDENT_AMBULATORY_CARE_PROVIDER_SITE_OTHER): Payer: Medicare Other

## 2012-07-27 VITALS — BP 158/60 | HR 48 | Ht 64.0 in | Wt 107.8 lb

## 2012-07-27 DIAGNOSIS — R1013 Epigastric pain: Secondary | ICD-10-CM

## 2012-07-27 DIAGNOSIS — D649 Anemia, unspecified: Secondary | ICD-10-CM

## 2012-07-27 DIAGNOSIS — M810 Age-related osteoporosis without current pathological fracture: Secondary | ICD-10-CM

## 2012-07-27 DIAGNOSIS — G8929 Other chronic pain: Secondary | ICD-10-CM

## 2012-07-27 DIAGNOSIS — R1310 Dysphagia, unspecified: Secondary | ICD-10-CM

## 2012-07-27 LAB — FERRITIN: Ferritin: 5.6 ng/mL — ABNORMAL LOW (ref 10.0–291.0)

## 2012-07-27 LAB — IBC PANEL
Iron: 25 ug/dL — ABNORMAL LOW (ref 42–145)
Saturation Ratios: 5.6 % — ABNORMAL LOW (ref 20.0–50.0)
Transferrin: 320.4 mg/dL (ref 212.0–360.0)

## 2012-07-27 LAB — VITAMIN B12: Vitamin B-12: 857 pg/mL (ref 211–911)

## 2012-07-27 LAB — FOLATE: Folate: 23.3 ng/mL (ref >5.9)

## 2012-07-27 NOTE — Patient Instructions (Signed)
Your physician has requested that you go to the basement for the following lab work before leaving today: Anemia Panel and Celiac Panel.  You have been scheduled for an esophageal manometry at [facility] on 08-02-2012 at 1 PM. Please arrive 30 minutes prior to your procedure for registration. You will need to go to outpatient registration (1st floor of the hospital) first. Make certain to bring your insurance cards as well as a complete list of medications.  Please remember the following:  1) Nothing to eat or drink after 12:00 midnight on the night before your test.  2) Hold all diabetic medications/insulin the morning of the test. You may eat and take your medications after the test.  3) For 3 days prior to your test do not take: Dexilant, Prevacid, Nexium, Protonix, Aciphex, Zegerid, Pantoprazole, Prilosec or omeprazole.  4) For 2 days prior to your test, do not take: Reglan, Tagamet, Zantac, Axid or Pepcid.  5) You MAY use an antacid such as Rolaids or Tums up to 12 hours prior to your test.  It will take at least 2 weeks to receive the results of this test from your physician. ------------------------------------------ ABOUT ESOPHAGEAL MANOMETRY Esophageal manometry (muh-NOM-uh-tree) is a test that gauges how well your esophagus works. Your esophagus is the long, muscular tube that connects your throat to your stomach. Esophageal manometry measures the rhythmic muscle contractions (peristalsis) that occur in your esophagus when you swallow. Esophageal manometry also measures the coordination and force exerted by the muscles of your esophagus.  During esophageal manometry, a thin, flexible tube (catheter) that contains sensors is passed through your nose, down your esophagus and into your stomach. Esophageal manometry can be helpful in diagnosing some mostly uncommon disorders that affect your esophagus.  Why it's done Esophageal manometry is used to evaluate the movement  (motility) of food through the esophagus and into the stomach. The test measures how well the circular bands of muscle (sphincters) at the top and bottom of your esophagus open and close, as well as the pressure, strength and pattern of the wave of esophageal muscle contractions that moves food along.  What you can expect Esophageal manometry is an outpatient procedure done without sedation. Most people tolerate it well. You may be asked to change into a hospital gown before the test starts.  During esophageal manometry  While you are sitting up, a member of your health care team sprays your throat with a numbing medication or puts numbing gel in your nose or both.  A catheter is guided through your nose into your esophagus. The catheter may be sheathed in a water-filled sleeve. It doesn't interfere with your breathing. However, your eyes may water, and you may gag. You may have a slight nosebleed from irritation.  After the catheter is in place, you may be asked to lie on your back on an exam table, or you may be asked to remain seated.  You then swallow small sips of water. As you do, a computer connected to the catheter records the pressure, strength and pattern of your esophageal muscle contractions.  During the test, you'll be asked to breathe slowly and smoothly, remain as still as possible, and swallow only when you're asked to do so.  A member of your health care team may move the catheter down into your stomach while the catheter continues its measurements.  The catheter then is slowly withdrawn. The test usually lasts 20 to 30 minutes.  After esophageal manometry  When your  esophageal manometry is complete, you may return to your normal activities  This test typically takes 30-45 minutes to complete. _____________________________________________________________________________________________________                                               We are excited to introduce MyChart, a  new best-in-class service that provides you online access to important information in your electronic medical record. We want to make it easier for you to view your health information - all in one secure location - when and where you need it. We expect MyChart will enhance the quality of care and service we provide.  When you register for MyChart, you can:    View your test results.    Request appointments and receive appointment reminders via email.    Request medication renewals.    View your medical history, allergies, medications and immunizations.    Communicate with your physician's office through a password-protected site.    Conveniently print information such as your medication lists.  To find out if MyChart is right for you, please talk to a member of our clinical staff today. We will gladly answer your questions about this free health and wellness tool.  If you are age 82 or older and want a member of your family to have access to your record, you must provide written consent by completing a proxy form available at our office. Please speak to our clinical staff about guidelines regarding accounts for patients younger than age 60.  As you activate your MyChart account and need any technical assistance, please call the MyChart technical support line at [phone] [phone]) or email your question to [email or web address]. If you email your question(s), please include your name, a return phone number and the best time to reach you.  If you have non-urgent health-related questions, you can send a message to our office through MyChart at [email or web address]. If you have a medical emergency, call 911.  Thank you for using MyChart as your new health and wellness resource!   MyChart licensed from [employer],  4540-9811. Patents Pending.

## 2012-07-27 NOTE — Progress Notes (Signed)
History of Present Illness:  This is a very nice 79 year old Caucasian female with diffuse atherosclerosis and previous coronary artery bypass surgery with known chronic mild ischemic bowel syndrome.  Her GI complaints have markedly improved since her coronary artery bypass surgery several years ago, and she really denies current significant abdominal pain, but does have nonspecific gas and bloating.  Other diagnoses have included pernicious anemia, and she uses when necessary Carafate for regurgitation.  She apparently recently had ENT evaluation because of a postnasal drip-strangling sensation, and was apparently told by her ENT physician that she had acid reflux.  However, this patient does not make acid, and has had that confirmed previous gastric analysis, and has + serologic markers for PA.  To me, she denies dysphagia but does have nonspecific occasional belching and burping.  Her appetite is good andher weight is stable.  There is no history of melena, hematochezia, or any current hepatobiliary complaints.  She is on multiple medications listed and reviewed her chart  including many cardiovascular medications, vitamin supplements, Flonase, Synthroid, and daily aspirin 81 mg.  She has a history of chronic pernicious anemia is been on B12 replacement therapy.  Family history is noncontributory.  She is up-to-date on her colonoscopies.   I have reviewed this patient's present history, medical and surgical past history, allergies and medications.     ROS:   All systems were reviewed and are negative unless otherwise stated in the HPI.    Physical Exam: Blood pressure 158/60, pulse 48 and regular, and weight 107 with BMI of 18.49.  99% oxygen saturation.  Cannot appreciate stigmata of chronic liver disease. General well developed well nourished patient in no acute distress, appearing their stated age Eyes PERRLA, no icterus, fundoscopic exam per opthamologist Skin no lesions noted Neck supple, no  adenopathy, no thyroid enlargement, no tenderness Chest clear to percussion and auscultation Heart no significant murmurs, gallops or rubs noted Abdomen no hepatosplenomegaly masses or tenderness, BS normal.  Extremities no acute joint lesions, edema, phlebitis or evidence of cellulitis. Neurologic patient oriented x 3, cranial nerves intact, no focal neurologic deficits noted. Psychological mental status normal and normal affect.  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  Since her cardiac by-pass surgery,he is fairly asymptomatic in terms of her previous chronic abdominal pain which required numerous GI evaluations.  She has nonspecific regurgitation at this time, most assuredly of nonacidic material.  I've asked continue his when necessary Carafate, we will check followup anemia and celiac profiles.  Esophageal manometry scheduled for review.  Otherwise she is to continue all medications as listed and reviewed per Dr. Debby Bud.  Please copy this note to Dr. Illene Regulus and to Dr. Tonny Bollman in cardiology.  I do not think followup endoscopy or colonoscopy indicated at this time.   No diagnosis found.

## 2012-07-28 LAB — CELIAC PANEL 10
Endomysial Screen: NEGATIVE
Gliadin IgA: 7.3 U/mL (ref <20)
Gliadin IgG: 15.9 U/mL (ref <20)
IgA: 236 mg/dL (ref 69–380)
Tissue Transglut Ab: 9.1 U/mL (ref <20)
Tissue Transglutaminase Ab, IgA: 6.6 U/mL (ref <20)

## 2012-08-02 ENCOUNTER — Encounter (HOSPITAL_COMMUNITY)
Admission: RE | Disposition: A | Discharge status: Home / Self Care | Payer: Self-pay | Source: Ambulatory Visit | Attending: Gastroenterology

## 2012-08-02 ENCOUNTER — Ambulatory Visit (HOSPITAL_COMMUNITY)
Admission: RE | Admit: 2012-08-02 | Discharge: 2012-08-02 | Disposition: A | Discharge status: Home / Self Care | Payer: Medicare Other | Source: Ambulatory Visit | Attending: Gastroenterology | Admitting: Gastroenterology

## 2012-08-02 DIAGNOSIS — K219 Gastro-esophageal reflux disease without esophagitis: Secondary | ICD-10-CM | POA: Insufficient documentation

## 2012-08-02 HISTORY — PX: ESOPHAGEAL MANOMETRY: SHX5429

## 2012-08-02 SURGERY — MANOMETRY, ESOPHAGUS

## 2012-08-02 MED ORDER — LIDOCAINE VISCOUS 2 % MT SOLN
OROMUCOSAL | Status: AC
  Filled 2012-08-02: qty 15

## 2012-08-03 ENCOUNTER — Other Ambulatory Visit: Payer: Self-pay

## 2012-08-03 MED ORDER — FERROUS FUM-IRON POLYSACCH 162-115.2 MG PO CAPS: 1.0000 | ORAL_CAPSULE | Freq: Every day | ORAL | Status: DC

## 2012-08-04 ENCOUNTER — Encounter (HOSPITAL_COMMUNITY): Payer: Self-pay | Admitting: Gastroenterology

## 2012-08-04 NOTE — Progress Notes (Signed)
Pt called back twice yesterday when I was out. Sheri wrote her a letter giving her results and ordered Tandem This am , pt reports the script was not at the Pharmacy; called [facility] who is having trouble with electronic scripts. Gave a verbal and reminder in for repeat labs in 2 months. Pt stated understanding.

## 2012-08-12 ENCOUNTER — Telehealth: Payer: Self-pay | Admitting: Gastroenterology

## 2012-08-12 NOTE — Telephone Encounter (Signed)
Pt called for results of the EM. Informed her the Manometry was normal and continue her reflux meds. Pt reports she's never been on any reflux meds, just Carafate. Continue Carafate; she states it helps? Thanks.

## 2012-09-14 ENCOUNTER — Other Ambulatory Visit: Payer: Self-pay

## 2012-09-14 MED ORDER — HYDRALAZINE HCL 25 MG PO TABS: 25.0000 mg | ORAL_TABLET | Freq: Three times a day (TID) | ORAL | Status: DC

## 2012-10-06 ENCOUNTER — Encounter (INDEPENDENT_AMBULATORY_CARE_PROVIDER_SITE_OTHER): Payer: Medicare Other

## 2012-10-06 ENCOUNTER — Ambulatory Visit (INDEPENDENT_AMBULATORY_CARE_PROVIDER_SITE_OTHER): Payer: Medicare Other | Admitting: Cardiovascular Disease

## 2012-10-06 ENCOUNTER — Encounter: Payer: Self-pay | Admitting: Cardiovascular Disease

## 2012-10-06 VITALS — BP 160/40 | HR 50 | Ht 64.0 in | Wt 108.0 lb

## 2012-10-06 DIAGNOSIS — I10 Essential (primary) hypertension: Secondary | ICD-10-CM

## 2012-10-06 DIAGNOSIS — R011 Cardiac murmur, unspecified: Secondary | ICD-10-CM

## 2012-10-06 DIAGNOSIS — I2510 Atherosclerotic heart disease of native coronary artery without angina pectoris: Secondary | ICD-10-CM

## 2012-10-06 DIAGNOSIS — I340 Nonrheumatic mitral (valve) insufficiency: Secondary | ICD-10-CM

## 2012-10-06 DIAGNOSIS — I6529 Occlusion and stenosis of unspecified carotid artery: Secondary | ICD-10-CM

## 2012-10-06 DIAGNOSIS — I059 Rheumatic mitral valve disease, unspecified: Secondary | ICD-10-CM

## 2012-10-06 NOTE — Progress Notes (Signed)
HPI:  [name] returns for followup evaluation. She is a 79 year old woman who was seen last in September. She has a history of coronary artery disease status post CABG in 2007. She's also followed for asymptomatic carotid stenosis and moderate mitral regurgitation. She's had malignant hypertension and has had some difficulty tolerating antihypertensive medicines. She has a wide pulse pressure.  The patient is doing well from a cardiac perspective. She complains of shortness of breath with certain activities such as walking up a hill. This resolves quickly with rest. There is no change in her exertional dyspnea. She denies chest pain or chest pressure. She is most limited by back problems. She has not had leg swelling, orthopnea, PND, or palpitations. Recent blood pressure check was 130/50.   Outpatient Encounter Prescriptions as of 10/06/2012  Medication Sig Dispense Refill  . aspirin 81 MG tablet Take 81 mg by mouth daily.        Marland Kitchen atorvastatin (LIPITOR) 80 MG tablet Take 1 tablet (80 mg total) by mouth daily.  30 tablet  11  . Calcium Citrate-Vitamin D (CITRACAL + D PO) Take by mouth daily.        . carvedilol (COREG) 12.5 MG tablet Take 1 tablet (12.5 mg total) by mouth 2 (two) times daily with a meal.  60 tablet  11  . cholecalciferol (VITAMIN D) 1000 UNITS tablet Take 2,000 Units by mouth daily.       . cyanocobalamin (,VITAMIN B-12,) 1000 MCG/ML injection Inject 1,000 mcg into the muscle every 30 (thirty) days.        Marland Kitchen diltiazem (CARDIZEM CD) 300 MG 24 hr capsule Take 1 capsule (300 mg total) by mouth daily.  30 capsule  11  . ferrous fumarate-iron polysaccharide complex (TANDEM) 162-115.2 MG CAPS Take 1 capsule by mouth daily with breakfast.  30 capsule  2  . fluticasone (FLONASE) 50 MCG/ACT nasal spray Place 2 sprays into the nose daily as needed.  16 g  11  . furosemide (LASIX) 40 MG tablet Take 1 tablet (40 mg total) by mouth daily.  30 tablet  5  . hydrALAZINE (APRESOLINE) 25 MG  tablet Take 1 tablet (25 mg total) by mouth 3 (three) times daily.  90 tablet  1  . levothyroxine (SYNTHROID, LEVOTHROID) 75 MCG tablet Take 75 mcg by mouth daily.        . Multiple Vitamins-Minerals (ICAPS) CAPS Take by mouth daily.        Marland Kitchen olmesartan (BENICAR) 40 MG tablet Take 1 tablet (40 mg total) by mouth daily.  30 tablet  11  . Potassium 99 MG TABS Take by mouth.      . sodium chloride (OCEAN) 0.65 % nasal spray Place 1 spray into the nose as needed.        . sucralfate (CARAFATE) 1 G tablet Take 1 g by mouth as needed.        . traMADol (ULTRAM) 50 MG tablet Take 50 mg by mouth every 6 (six) hours as needed.        . zoledronic acid (RECLAST) 5 MG/100ML SOLN Inject 100 mLs (5 mg total) into the vein once. Short Stay  100 mL  0  . ONE TOUCH ULTRA TEST test strip as directed.       No facility-administered encounter medications on file as of 10/06/2012.    Allergies  Allergen Reactions  . Codeine   . Sulfonamide Derivatives     Past Medical History  Diagnosis Date  .  Renal calculus   . PVD (peripheral vascular disease)   . Anemia, pernicious   . Atrophic gastritis   . Hypothyroidism   . HLD (hyperlipidemia)   . HTN (hypertension)   . CAD (coronary artery disease)   . Carotid artery stenosis   . Median arcuate ligament syndrome   . Arthritis     bursitis     ROS: Negative except as per HPI  BP 160/40  Pulse 50  Ht 5\' 4"  (1.626 m)  Wt 108 lb (48.988 kg)  BMI 18.53 kg/m2  PHYSICAL EXAM: Pt is alert and oriented, pleasant, thin elderly woman in NAD HEENT: normal Neck: JVP - normal, carotids 2+= with bilateral bruits Lungs: CTA bilaterally CV: RRR with a paradoxical S2 and grade 2/6 holosystolic murmur heard at the apex in the left lateral decubitus position Abd: soft, NT, Positive BS, no hepatomegaly Ext: no C/C/E, distal pulses intact and equal Skin: warm/dry no rash  EKG:  Sinus bradycardia 50 beats per minute, left bundle branch block.  2-D echocardiogram  01/08/2012: Left ventricle: The cavity size was normal. Wall thickness was normal. Systolic function was normal. The estimated ejection fraction was in the range of 55% to 60%.  ------------------------------------------------------------ Aortic valve: Mildly thickened, mildly calcified leaflets. Doppler: Moderate regurgitation. VTI ratio of LVOT to aortic valve: 0.73. Indexed valve area: 0.99cm^2/m^2 (VTI). Peak velocity ratio of LVOT to aortic valve: 0.73. Indexed valve area: 0.99cm^2/m^2 (Vmax). Mean gradient: 10mm Hg (S). Peak gradient: 19mm Hg (S).  ------------------------------------------------------------ Mitral valve: Mildly thickened leaflets . Doppler: Moderate regurgitation. Peak gradient: 9mm Hg (D).  ------------------------------------------------------------ Left atrium: The atrium was mildly dilated.  ------------------------------------------------------------ Right ventricle: The cavity size was normal. Wall thickness was normal. Systolic function was normal.  ------------------------------------------------------------ Pulmonic valve: Structurally normal valve. Cusp separation was normal. Doppler: Transvalvular velocity was within the normal range. No regurgitation.  ------------------------------------------------------------ Tricuspid valve: Structurally normal valve. Leaflet separation was normal. Doppler: Transvalvular velocity was within the normal range. Mild regurgitation.  ------------------------------------------------------------ Right atrium: The atrium was normal in size.  ------------------------------------------------------------ Pericardium: There was no pericardial effusion.  ASSESSMENT AND PLAN: 1. Coronary artery disease, native vessel. The patient is stable without anginal symptoms. She's had previous CABG. Will continue her medical program without changes.  2. Carotid stenosis no history of stroke or TIA. Continue medical therapy  with aspirin and a statin drug. She will have a surveillance carotid duplex scan later today.  3. Mitral regurgitation, moderate. Will repeat an echocardiogram in September at a one-year interval from her previous. She does have exertional dyspnea, but no change in symptoms. Her exam is consistent with moderate MR.  4. Hypertension. Systolic blood pressure is above goal, but diastolic blood pressure is only 40 mm mercury. I think the safest thing to do is continue her current program and not escalate her antihypertensive therapy.  I'll see her back in one year for followup unless problems arise  Tonny Bollman 10/06/2012 11:42 AM

## 2012-10-06 NOTE — Patient Instructions (Addendum)
Your physician has requested that you have an echocardiogram in September. Echocardiography is a painless test that uses sound waves to create images of your heart. It provides your doctor with information about the size and shape of your heart and how well your heart's chambers and valves are working. This procedure takes approximately one hour. There are no restrictions for this procedure.  Your physician wants you to follow-up in: 1 YEAR with Dr Excell Seltzer.  You will receive a reminder letter in the mail two months in advance. If you don't receive a letter, please call our office to schedule the follow-up appointment.  Your physician recommends that you continue on your current medications as directed. Please refer to the Current Medication list given to you today.

## 2012-10-18 ENCOUNTER — Encounter: Payer: Self-pay | Admitting: *Deleted

## 2012-10-18 NOTE — Telephone Encounter (Signed)
Message copied by Florene Glen on Mon Oct 18, 2012  2:24 PM ------      Message from: Florene Glen      Created: Wed Aug 04, 2012  9:31 AM       Repeat iron studies after Tandem ------

## 2012-10-26 ENCOUNTER — Other Ambulatory Visit (INDEPENDENT_AMBULATORY_CARE_PROVIDER_SITE_OTHER): Payer: Medicare Other

## 2012-10-26 ENCOUNTER — Encounter: Payer: Self-pay | Admitting: Gastroenterology

## 2012-10-26 ENCOUNTER — Ambulatory Visit (INDEPENDENT_AMBULATORY_CARE_PROVIDER_SITE_OTHER): Payer: Medicare Other | Admitting: Gastroenterology

## 2012-10-26 VITALS — BP 180/62 | HR 55 | Ht 64.0 in | Wt 106.4 lb

## 2012-10-26 DIAGNOSIS — E039 Hypothyroidism, unspecified: Secondary | ICD-10-CM

## 2012-10-26 DIAGNOSIS — R1013 Epigastric pain: Secondary | ICD-10-CM

## 2012-10-26 DIAGNOSIS — D509 Iron deficiency anemia, unspecified: Secondary | ICD-10-CM

## 2012-10-26 DIAGNOSIS — K3189 Other diseases of stomach and duodenum: Secondary | ICD-10-CM

## 2012-10-26 DIAGNOSIS — I774 Celiac artery compression syndrome: Secondary | ICD-10-CM

## 2012-10-26 DIAGNOSIS — K30 Functional dyspepsia: Secondary | ICD-10-CM

## 2012-10-26 DIAGNOSIS — G8929 Other chronic pain: Secondary | ICD-10-CM

## 2012-10-26 LAB — CBC WITH DIFFERENTIAL/PLATELET
Basophils Absolute: 0 10*3/uL (ref 0.0–0.1)
Basophils Relative: 0.3 % (ref 0.0–3.0)
Eosinophils Absolute: 0 10*3/uL (ref 0.0–0.7)
Eosinophils Relative: 0.1 % (ref 0.0–5.0)
HCT: 38.6 % (ref 36.0–46.0)
Hemoglobin: 12.6 g/dL (ref 12.0–15.0)
Lymphocytes Relative: 19.1 % (ref 12.0–46.0)
Lymphs Abs: 1.1 10*3/uL (ref 0.7–4.0)
MCHC: 32.8 g/dL (ref 30.0–36.0)
MCV: 77.7 fl — ABNORMAL LOW (ref 78.0–100.0)
Monocytes Absolute: 0.6 10*3/uL (ref 0.1–1.0)
Monocytes Relative: 9.6 % (ref 3.0–12.0)
Neutro Abs: 4.2 10*3/uL (ref 1.4–7.7)
Neutrophils Relative %: 70.9 % (ref 43.0–77.0)
Platelets: 224 10*3/uL (ref 150.0–400.0)
RBC: 4.96 Mil/uL (ref 3.87–5.11)
RDW: 26 % — ABNORMAL HIGH (ref 11.5–14.6)
WBC: 6 10*3/uL (ref 4.5–10.5)

## 2012-10-26 LAB — BASIC METABOLIC PANEL
BUN: 21 mg/dL (ref 6–23)
CO2: 30 mEq/L (ref 19–32)
Calcium: 9.8 mg/dL (ref 8.4–10.5)
Chloride: 103 mEq/L (ref 96–112)
Creatinine, Ser: 1 mg/dL (ref 0.4–1.2)
GFR: 59.03 mL/min — ABNORMAL LOW (ref >60.00)
Glucose, Bld: 87 mg/dL (ref 70–99)
Potassium: 3.9 mEq/L (ref 3.5–5.1)
Sodium: 140 mEq/L (ref 135–145)

## 2012-10-26 LAB — IBC PANEL
Iron: 102 ug/dL (ref 42–145)
Saturation Ratios: 23.5 % (ref 20.0–50.0)
Transferrin: 309.5 mg/dL (ref 212.0–360.0)

## 2012-10-26 LAB — VITAMIN B12: Vitamin B-12: 577 pg/mL (ref 211–911)

## 2012-10-26 LAB — FOLATE: Folate: >24.8 ng/mL (ref >5.9)

## 2012-10-26 LAB — HEPATIC FUNCTION PANEL
ALT: 22 U/L (ref 0–35)
AST: 28 U/L (ref 0–37)
Albumin: 4.3 g/dL (ref 3.5–5.2)
Alkaline Phosphatase: 43 U/L (ref 39–117)
Bilirubin, Direct: 0 mg/dL (ref 0.0–0.3)
Total Bilirubin: 0.5 mg/dL (ref 0.3–1.2)
Total Protein: 7.7 g/dL (ref 6.0–8.3)

## 2012-10-26 LAB — FERRITIN: Ferritin: 9.3 ng/mL — ABNORMAL LOW (ref 10.0–291.0)

## 2012-10-26 LAB — TSH: TSH: 0.8 u[IU]/mL (ref 0.35–5.50)

## 2012-10-26 MED ORDER — ALIGN 4 MG PO CAPS: 1.0000 | ORAL_CAPSULE | Freq: Every day | ORAL | Status: DC

## 2012-10-26 MED ORDER — SUCRALFATE 1 G PO TABS: 1.0000 g | ORAL_TABLET | ORAL | Status: DC | PRN

## 2012-10-26 NOTE — Progress Notes (Signed)
History of Present Illness: This is a very complex 79 year old Caucasian female with multiple, multiple medical and cardiovascular issues with suspected chronic low grade ischemic bowel issues.  She recently had increased regurgitation, and esophageal manometry was performed and was essentially normal.  She's had some relief of her postprandial epigastric pain with liquid Carafate.  As part of her workup she was found to have rather marked iron deficiency and is been on oral iron replacement therapy which is causing dark stools and some constipation.  She has chronic functional constipation with gas and bloating.  She's on over 15 medications listed and reviewed her chart.  Despite all these complaints she's not had anorexia or significant weight loss, melena or hematochezia.  Last endoscopy and colonoscopy was several years ago but were unremarkable.  Celiac antibodies were normal.   Current Medications, Allergies, Past Medical History, Past Surgical History, Family History and Social History were reviewed in [employer] record.  ROS: All systems were reviewed and are negative unless otherwise stated in the HPI.     PE: Awake and alert no acute distress.  Blood pressure 180/62, pulse 55 and regular and weight 106 with BMI of 18.25.    Assessment and plan: Chronic iron deficiency in an elderly lady with chronic low-grade ischemic bowel problems who is otherwise doing fairly well from a GI standpoint.  She is on multiple multiple medications including daily aspirin 81 mg,  blood pressure medications, and a chronic dyspepsia seems to have responded to liquid Carafate.  I will check her stool IFOB , repeat CBC and iron studies, and refill her liquid Carafate suspension.  She is a poor candidate for any type of endoscopic procedure with her cardiovascular issues.  Colonoscopy was negative in 2006, and endoscopy in 2009.  Review of angiogram of her mesenteric arteries in 2009 shows  probable celiac axis compression from her median arcuate ligament without real atherogenesis of her other mesenteric arteries or other significant abnormalities..  She is followed by Dr. Tonny Bollman in cardiology, and Dr. Illene Regulus in internal medicine.  We could refer her to vascular surgery for consultation, but I do not think she is a good surgical candidate for any type of celiac axis surgery, but this is a consideration.  I will repeat her blood work first and decide about CVT has referral. Followed by Dr. Earmon Phoenix input and Dr. Debby Bud input also.  I've also started her on Benefiber 1 tablespoon sprinkled in her food twice a day with liberal by mouth fluids, also a trial of daily probiotic therapy.  Please copy Dr. Tonny Bollman in cardiology and Dr. Illene Regulus in internal medicine Encounter Diagnosis  Name Primary?  Marland Kitchen Anemia, iron deficiency Yes

## 2012-10-26 NOTE — Patient Instructions (Addendum)
Your physician has requested that you go to the basement for the lab work before leaving today.  We have given you samples of Align. This puts good bacteria back into your colon. You should take 1 capsule by mouth once daily. If this works well for you, it can be purchased over the counter.  We have sent the following medications to your pharmacy for you to pick up at your convenience: Carafate.  Please use over the counter Benefiber one teaspoon twice daily with meals.

## 2012-11-01 NOTE — Telephone Encounter (Signed)
Opened in error; pt has had labs drawn

## 2012-11-08 ENCOUNTER — Other Ambulatory Visit (INDEPENDENT_AMBULATORY_CARE_PROVIDER_SITE_OTHER): Payer: Medicare Other

## 2012-11-08 DIAGNOSIS — D509 Iron deficiency anemia, unspecified: Secondary | ICD-10-CM

## 2012-11-08 LAB — FECAL OCCULT BLOOD, IMMUNOCHEMICAL: Fecal Occult Bld: NEGATIVE

## 2012-11-17 ENCOUNTER — Telehealth: Payer: Self-pay | Admitting: Internal Medicine

## 2012-11-17 NOTE — Telephone Encounter (Signed)
Pt made an appt for Monday, but would like to be worked in this week if possible.  She has a rash and problem with her toe.

## 2012-11-17 NOTE — Telephone Encounter (Signed)
Ok to add on tomorrow

## 2012-11-17 NOTE — Telephone Encounter (Signed)
Pt is coming at 10:30 July 24.

## 2012-11-18 ENCOUNTER — Encounter: Payer: Self-pay | Admitting: Internal Medicine

## 2012-11-18 ENCOUNTER — Ambulatory Visit (INDEPENDENT_AMBULATORY_CARE_PROVIDER_SITE_OTHER): Payer: Medicare Other

## 2012-11-18 ENCOUNTER — Ambulatory Visit (INDEPENDENT_AMBULATORY_CARE_PROVIDER_SITE_OTHER): Payer: Medicare Other | Admitting: Internal Medicine

## 2012-11-18 ENCOUNTER — Other Ambulatory Visit: Payer: Self-pay

## 2012-11-18 VITALS — BP 130/66 | HR 51 | Temp 98.3°F | Wt 106.0 lb

## 2012-11-18 DIAGNOSIS — R233 Spontaneous ecchymoses: Secondary | ICD-10-CM

## 2012-11-18 LAB — URINALYSIS, ROUTINE W REFLEX MICROSCOPIC
Bilirubin Urine: NEGATIVE
Hgb urine dipstick: NEGATIVE
Ketones, ur: NEGATIVE
Nitrite: NEGATIVE
Specific Gravity, Urine: 1.015 (ref 1.000–1.030)
Total Protein, Urine: NEGATIVE
Urine Glucose: NEGATIVE
Urobilinogen, UA: 0.2 (ref 0.0–1.0)
pH: 6 (ref 5.0–8.0)

## 2012-11-18 MED ORDER — DILTIAZEM HCL ER COATED BEADS 300 MG PO CP24: 300.0000 mg | ORAL_CAPSULE | Freq: Every day | ORAL | Status: DC

## 2012-11-18 NOTE — Progress Notes (Signed)
Subjective:    Patient ID: [name], female    DOB: [birth year], 79 y.o.   MRN: [id number]  HPI [name] presents for evaluation of a petechial rash distal LE R>L. She has not had any symptoms of infectious disease recently: no fever, chills, cough. No bleeding from gums or other unexplained bleeding except for a subconjunctival hemorrhage. There is no pain or discomfort, the redness worsens with hot shower. No h/o hematologic disease, no new medications.  Past Medical History  Diagnosis Date  . Renal calculus   . PVD (peripheral vascular disease)   . Anemia, pernicious   . Atrophic gastritis   . Hypothyroidism   . HLD (hyperlipidemia)   . HTN (hypertension)   . CAD (coronary artery disease)   . Carotid artery stenosis   . Median arcuate ligament syndrome   . Arthritis     bursitis    Past Surgical History  Procedure Laterality Date  . Coronary artery bypass graft    . Appendectomy    . Biopsy thyroid    . Thyroid lobectomy    . Abdominal hysterectomy    . Esophageal manometry N/A 08/02/2012    Procedure: ESOPHAGEAL MANOMETRY (EM);  Surgeon: Mardella Layman, MD;  Location: [facility];  Service: Endoscopy;  Laterality: N/A;   Family History  Problem Relation Age of Onset  . Heart disease Mother   . Prostate cancer Brother   . Breast cancer Other     niece - breast cancer  . Diabetes Sister   . Lung cancer Father   . Lung cancer Sister    History   Social History  . Marital Status: Married    Spouse Name: N/A    Number of Children: N/A  . Years of Education: 12   Occupational History  . retired   .     Social History Main Topics  . Smoking status: Never Smoker   . Smokeless tobacco: Never Used  . Alcohol Use: No  . Drug Use: No  . Sexually Active: Not on file   Other Topics Concern  . Not on file   Social History Narrative   [name], Married. Lives with husband. Had been a very active woman.    Current Outpatient Prescriptions on File Prior  to Visit  Medication Sig Dispense Refill  . aspirin 81 MG tablet Take 81 mg by mouth daily.        Marland Kitchen atorvastatin (LIPITOR) 80 MG tablet Take 1 tablet (80 mg total) by mouth daily.  30 tablet  11  . Calcium Citrate-Vitamin D (CITRACAL + D PO) Take by mouth daily.        . carvedilol (COREG) 12.5 MG tablet Take 1 tablet (12.5 mg total) by mouth 2 (two) times daily with a meal.  60 tablet  11  . cholecalciferol (VITAMIN D) 1000 UNITS tablet Take 2,000 Units by mouth daily.       . cyanocobalamin (,VITAMIN B-12,) 1000 MCG/ML injection Inject 1,000 mcg into the muscle every 30 (thirty) days.        . ferrous fumarate-iron polysaccharide complex (TANDEM) 162-115.2 MG CAPS Take 1 capsule by mouth daily with breakfast.  30 capsule  2  . fluticasone (FLONASE) 50 MCG/ACT nasal spray Place 2 sprays into the nose daily as needed.  16 g  11  . furosemide (LASIX) 40 MG tablet Take 1 tablet (40 mg total) by mouth daily.  30 tablet  5  . levothyroxine (SYNTHROID, LEVOTHROID) 75 MCG tablet  Take 75 mcg by mouth daily.        . Multiple Vitamins-Minerals (ICAPS) CAPS Take by mouth daily.        Marland Kitchen olmesartan (BENICAR) 40 MG tablet Take 1 tablet (40 mg total) by mouth daily.  30 tablet  11  . ONE TOUCH ULTRA TEST test strip as directed.      . Potassium 99 MG TABS Take by mouth.      . Probiotic Product (ALIGN) 4 MG CAPS Take 1 capsule by mouth daily.  14 capsule  0  . sodium chloride (OCEAN) 0.65 % nasal spray Place 1 spray into the nose as needed.        . sucralfate (CARAFATE) 1 G tablet Take 1 tablet (1 g total) by mouth as needed.  30 tablet  11  . traMADol (ULTRAM) 50 MG tablet Take 50 mg by mouth every 6 (six) hours as needed.        . zoledronic acid (RECLAST) 5 MG/100ML SOLN Inject 100 mLs (5 mg total) into the vein once. Short Stay  100 mL  0   No current facility-administered medications on file prior to visit.      Review of Systems  Constitutional: Negative.   HENT: Negative.   Eyes: Negative.    Respiratory: Negative.   Cardiovascular: Negative.   Gastrointestinal: Negative.   Endocrine: Negative.   Genitourinary: Negative.   Musculoskeletal: Negative.   Skin: Positive for color change and rash. Negative for pallor.  Allergic/Immunologic: Negative.   Neurological: Negative.   Hematological: Negative.   Psychiatric/Behavioral: Negative.        Objective:   Physical Exam Filed Vitals:   11/18/12 1041  BP: 130/66  Pulse: 51  Temp: 98.3 F (36.8 C)   gen'l- slender woman in no distress HEENT_ C&S clear, PERRLA Nodes - negative cervical region Cor - 2+ radial pulse Pulm - normal  Derm - nonblanching pinpoint rash from ankles to mid-calve. Non tender, not hot.       Assessment & Plan:  Petechial rash - no systemic symptoms of disease Plan R/o vasculitis and other rheumatologic disease   Recent Results (from the past 2160 hour(s))  BASIC METABOLIC PANEL     Status: Abnormal   Collection Time    10/26/12  4:00 PM      Result Value Range   Sodium 140  135 - 145 mEq/L   Potassium 3.9  3.5 - 5.1 mEq/L   Chloride 103  96 - 112 mEq/L   CO2 30  19 - 32 mEq/L   Glucose, Bld 87  70 - 99 mg/dL   BUN 21  6 - 23 mg/dL   Creatinine, Ser 1.0  0.4 - 1.2 mg/dL   Calcium 9.8  8.4 - 16.1 mg/dL   GFR 09.60 (*) >45.40 mL/min  HEPATIC FUNCTION PANEL     Status: None   Collection Time    10/26/12  4:00 PM      Result Value Range   Total Bilirubin 0.5  0.3 - 1.2 mg/dL   Bilirubin, Direct 0.0  0.0 - 0.3 mg/dL   Alkaline Phosphatase 43  39 - 117 U/L   AST 28  0 - 37 U/L   ALT 22  0 - 35 U/L   Total Protein 7.7  6.0 - 8.3 g/dL   Albumin 4.3  3.5 - 5.2 g/dL  CBC WITH DIFFERENTIAL     Status: Abnormal   Collection Time    10/26/12  4:00  PM      Result Value Range   WBC 6.0  4.5 - 10.5 K/uL   RBC 4.96  3.87 - 5.11 Mil/uL   Hemoglobin 12.6  12.0 - 15.0 g/dL   HCT 40.9  81.1 - 91.4 %   MCV 77.7 (*) 78.0 - 100.0 fl   MCHC 32.8  30.0 - 36.0 g/dL   RDW 78.2 (*) 95.6 - 21.3  %   Platelets 224.0  150.0 - 400.0 K/uL   Neutrophils Relative % 70.9  43.0 - 77.0 %   Lymphocytes Relative 19.1  12.0 - 46.0 %   Monocytes Relative 9.6  3.0 - 12.0 %   Eosinophils Relative 0.1  0.0 - 5.0 %   Basophils Relative 0.3  0.0 - 3.0 %   Neutro Abs 4.2  1.4 - 7.7 K/uL   Lymphs Abs 1.1  0.7 - 4.0 K/uL   Monocytes Absolute 0.6  0.1 - 1.0 K/uL   Eosinophils Absolute 0.0  0.0 - 0.7 K/uL   Basophils Absolute 0.0  0.0 - 0.1 K/uL  TSH     Status: None   Collection Time    10/26/12  4:00 PM      Result Value Range   TSH 0.80  0.35 - 5.50 uIU/mL  IBC PANEL     Status: None   Collection Time    10/26/12  4:00 PM      Result Value Range   Iron 102  42 - 145 ug/dL   Transferrin 086.5  784.6 - 360.0 mg/dL   Saturation Ratios 96.2  20.0 - 50.0 %  FERRITIN     Status: Abnormal   Collection Time    10/26/12  4:00 PM      Result Value Range   Ferritin 9.3 (*) 10.0 - 291.0 ng/mL  VITAMIN B12     Status: None   Collection Time    10/26/12  4:00 PM      Result Value Range   Vitamin B-12 577  211 - 911 pg/mL  FOLATE     Status: None   Collection Time    10/26/12  4:00 PM      Result Value Range   Folate >24.8  >5.9 ng/mL  FECAL OCCULT BLOOD, IMMUNOCHEMICAL     Status: None   Collection Time    11/08/12 10:23 AM      Result Value Range   Fecal Occult Bld Negative  Negative  URINALYSIS, ROUTINE W REFLEX MICROSCOPIC     Status: None   Collection Time    11/18/12 11:30 AM      Result Value Range   Color, Urine LT. YELLOW  Yellow;Lt. Yellow   APPearance CLEAR  Clear   Specific Gravity, Urine 1.015  1.000-1.030   pH 6.0  5.0 - 8.0   Total Protein, Urine NEGATIVE  Negative   Urine Glucose NEGATIVE  Negative   Ketones, ur NEGATIVE  Negative   Bilirubin Urine NEGATIVE  Negative   Hgb urine dipstick NEGATIVE  Negative   Urobilinogen, UA 0.2  0.0 - 1.0   Leukocytes, UA SMALL  Negative   Nitrite NEGATIVE  Negative   WBC, UA 11-20/hpf  0-2/hpf   RBC / HPF 0-2/hpf  0-2/hpf    Mucus, UA Presence of  None   Squamous Epithelial / LPF Rare(0-4/hpf)  Rare(0-4/hpf)   Bacteria, UA Rare(<10/hpf)  None  ANA     Status: Abnormal   Collection Time    11/18/12 11:30 AM  Result Value Range   ANA POS (*) NEGATIVE  C3 AND C4     Status: Abnormal   Collection Time    11/18/12 11:30 AM      Result Value Range   C3 Complement 54 (*) 90 - 180 mg/dL   C4 Complement 24  10 - 40 mg/dL  ANTI-NUCLEAR AB-TITER (ANA TITER)     Status: Abnormal   Collection Time    11/18/12 11:30 AM      Result Value Range   ANA Titer 1 1:640 (*) <1:40     Comment:       Reference Ranges:     1:40 - 1:80 Weakly positive, usually not clinically significant.     > or = to 1:160 Result may be clinically significant.                                                                              ANA Pattern 1 HOMOGENOUS (*)   COMPLEMENT, TOTAL     Status: None   Collection Time    11/18/12 12:26 PM      Result Value Range   Compl, Total (CH50) 50  31 - 60 U/mL   Plan With normal complement, positive ANA at 1:640 will bring her back for punch biopsy of rash  May need rheumatology consult.

## 2012-11-18 NOTE — Patient Instructions (Addendum)
Rash - non blanching red spots = petechial rash. You have had a lot of labwork July 1 which revealed normal blood count, platelet count, liver functions, kidney functions, and iron levels.   Plan Ruling out other causes: urinalysis, complement levels (immune system check), ANA - connective tissue inflammation.  If these tests come back normal will need to consider a small biopsy.  Can look up more information about a petechial rash at [email or web address].  See podiatrist for your great toe nail function

## 2012-11-19 ENCOUNTER — Other Ambulatory Visit: Payer: Self-pay

## 2012-11-19 LAB — ANTI-NUCLEAR AB-TITER (ANA TITER): ANA Titer 1: 1:640 {titer} — ABNORMAL HIGH

## 2012-11-19 LAB — C3 AND C4
C3 Complement: 54 mg/dL — ABNORMAL LOW (ref 90–180)
C4 Complement: 24 mg/dL (ref 10–40)

## 2012-11-19 LAB — ANA: Anti Nuclear Antibody(ANA): POSITIVE — AB

## 2012-11-19 MED ORDER — HYDRALAZINE HCL 25 MG PO TABS: 25.0000 mg | ORAL_TABLET | Freq: Three times a day (TID) | ORAL | Status: DC

## 2012-11-20 ENCOUNTER — Encounter: Payer: Self-pay | Admitting: Internal Medicine

## 2012-11-20 LAB — COMPLEMENT, TOTAL: Compl, Total (CH50): 50 U/mL (ref 31–60)

## 2012-11-22 ENCOUNTER — Ambulatory Visit: Payer: Medicare Other | Admitting: Internal Medicine

## 2012-11-22 ENCOUNTER — Telehealth: Payer: Self-pay

## 2012-11-22 MED ORDER — CIPROFLOXACIN HCL 250 MG PO TABS: 250.0000 mg | ORAL_TABLET | Freq: Two times a day (BID) | ORAL | Status: DC

## 2012-11-22 NOTE — Telephone Encounter (Signed)
Message copied by Noreene Larsson on Mon Nov 22, 2012  9:38 AM ------      Message from: Jacques Navy      Created: Sat Nov 20, 2012 10:52 PM       1. Possible UTI - check with her about any symptoms.      2. Needs to be scheduled for a punch biopsy of the rash on her legs.            Thanks ------

## 2012-11-22 NOTE — Telephone Encounter (Signed)
cipro sent to pharmacy

## 2012-11-22 NOTE — Telephone Encounter (Signed)
Call in cipro 250 mg bid x 5 for uti. Thanks

## 2012-11-22 NOTE — Telephone Encounter (Signed)
Phone call to pt, she states she has had some burning and urinary frequency. She was transferred to the front desk to schedule the punch biopsy

## 2012-11-24 ENCOUNTER — Encounter: Payer: Self-pay | Admitting: Internal Medicine

## 2012-11-24 ENCOUNTER — Ambulatory Visit (INDEPENDENT_AMBULATORY_CARE_PROVIDER_SITE_OTHER): Payer: Medicare Other | Admitting: Internal Medicine

## 2012-11-24 VITALS — BP 134/48 | HR 44 | Temp 97.1°F | Wt 109.0 lb

## 2012-11-24 DIAGNOSIS — D489 Neoplasm of uncertain behavior, unspecified: Secondary | ICD-10-CM

## 2012-11-24 NOTE — Progress Notes (Signed)
  Subjective:    Patient ID: [name], female    DOB: 1932/06/20, 79 y.o.   MRN: [id number]  HPI [name] was seen several days ago for evaluation of a petechial rash bilateral distal LE. Labs returned with an ANA 1:640, normal complement levels. She returns for diagnostic punch biopsy.  PMH, FamHx and SocHx reviewed for any changes and relevance.  Meds - reviewed   Review of Systems System review is negative for any constitutional, cardiac, pulmonary, GI or neuro symptoms or complaints other than as described in the HPI.     Objective:   Physical Exam Filed Vitals:   11/24/12 1535  BP: 134/48  Pulse: 44  Temp: 97.1 F (36.2 C)   Derm - petechial rash distal lower extremities  Procedure: punch biopsy Indication: petechial rash r/o vasculitis.  Consent: after full explanation of risks of bleeding, infection and complications patient gives written/oral consent Location: distal right lower extremity Anesthesia: 2% xylocaine with epinephrine Prep: betadine followed by alcohol Using a  3 Mm punch the specimen was sampled. Specimen sent to pathology Wound: closed wound with 4-o ethilon, single suture Patient tolerated procedure well. They are given routine wound precautions. They will return in 1 week for suture removal.        Assessment & Plan:  Petechial rash - possibly vasculitis.

## 2012-11-26 ENCOUNTER — Encounter: Payer: Self-pay | Admitting: Internal Medicine

## 2012-12-02 ENCOUNTER — Encounter: Payer: Self-pay | Admitting: Internal Medicine

## 2012-12-02 ENCOUNTER — Ambulatory Visit (INDEPENDENT_AMBULATORY_CARE_PROVIDER_SITE_OTHER): Payer: Medicare Other | Admitting: Internal Medicine

## 2012-12-02 VITALS — BP 128/52 | HR 46 | Temp 97.1°F | Wt 107.4 lb

## 2012-12-02 DIAGNOSIS — R001 Bradycardia, unspecified: Secondary | ICD-10-CM

## 2012-12-02 DIAGNOSIS — I498 Other specified cardiac arrhythmias: Secondary | ICD-10-CM

## 2012-12-02 DIAGNOSIS — R233 Spontaneous ecchymoses: Secondary | ICD-10-CM

## 2012-12-02 DIAGNOSIS — Z4802 Encounter for removal of sutures: Secondary | ICD-10-CM

## 2012-12-02 MED ORDER — CARVEDILOL 6.25 MG PO TABS: 6.2500 mg | ORAL_TABLET | Freq: Two times a day (BID) | ORAL | Status: DC

## 2012-12-02 NOTE — Patient Instructions (Addendum)
1. Skin rash - may be a drug reaction but NOT vasculitits Plan Stop iron  2. Bradycardia (slow heart rate) - your rate did come up from 46 at rest to 60 after 30 sec step test - this is good Plan Reduce dose of carvedilol to 6.25 mg twice a day  Monitor your heart rate and let me know if it stays low.

## 2012-12-04 NOTE — Progress Notes (Signed)
  Subjective:    Patient ID: [name], female    DOB: [birth year], 79 y.o.   MRN: [id number]  HPI [name] had a punch biopsy of a petechial rash on distal lower extremities. The wound has healed nicely and she is for single suture removal.  PMH, FamHx and SocHx reviewed for any changes and relevance. Reviewed all meds - no change from last visit   Review of Systems System review is negative for any constitutional, cardiac, pulmonary, GI or neuro symptoms or complaints other than as described in the HPI.     Objective:   Physical Exam Filed Vitals:   12/02/12 1418  BP: 128/52  Pulse: 46  Temp: 97.1 F (36.2 C)   Derm- punch biopsy would distal right LE - w/o erythema or drainage. Suture remove w/o difficulty       Assessment & Plan:  1. Petechial rash - final path report: no vasculitis. Findings c/w possible mobiliform drug reaction.  Plan Stop iron supplement as last drug added.  2. Bradycardia - HR in the mid 40's. With 30 sec step-test HR came up to low 60'2.  Plan Reduce Coreg dose to 6.25 mg bid  Monitor heart rate.

## 2012-12-28 ENCOUNTER — Telehealth: Payer: Self-pay | Admitting: Internal Medicine

## 2012-12-28 NOTE — Telephone Encounter (Signed)
Rash is doing better, but is not gone

## 2012-12-28 NOTE — Telephone Encounter (Signed)
Good news.

## 2012-12-30 ENCOUNTER — Other Ambulatory Visit (HOSPITAL_COMMUNITY): Payer: Self-pay | Admitting: Internal Medicine

## 2012-12-30 ENCOUNTER — Encounter (HOSPITAL_COMMUNITY): Payer: Self-pay

## 2012-12-30 ENCOUNTER — Ambulatory Visit (HOSPITAL_COMMUNITY)
Admission: RE | Admit: 2012-12-30 | Discharge: 2012-12-30 | Disposition: A | Discharge status: Home / Self Care | Payer: Medicare Other | Source: Ambulatory Visit | Attending: Internal Medicine | Admitting: Internal Medicine

## 2012-12-30 DIAGNOSIS — M810 Age-related osteoporosis without current pathological fracture: Secondary | ICD-10-CM | POA: Insufficient documentation

## 2012-12-30 MED ORDER — SODIUM CHLORIDE 0.9 % IV SOLN
Freq: Once | INTRAVENOUS | Status: AC
  Administered 2012-12-30: 11:00:00 via INTRAVENOUS

## 2012-12-30 MED ORDER — ZOLEDRONIC ACID 5 MG/100ML IV SOLN
5.0000 mg | Freq: Once | INTRAVENOUS | Status: AC
  Administered 2012-12-30: 5 mg via INTRAVENOUS
  Filled 2012-12-30: qty 100

## 2012-12-30 NOTE — Progress Notes (Signed)
bp high on admit and d/c.  Pt states she has a bp monitor at home.  Advised pt to check her bp later at home and to follow up with MD for questions/concerns.  Pt is not lightheaded or dizzy.  Pt states I walked a long way into the building.

## 2013-01-10 ENCOUNTER — Other Ambulatory Visit (HOSPITAL_COMMUNITY): Payer: Medicare Other

## 2013-01-10 ENCOUNTER — Ambulatory Visit (HOSPITAL_COMMUNITY): Payer: Medicare Other | Attending: Cardiovascular Disease | Admitting: Radiology

## 2013-01-10 DIAGNOSIS — E785 Hyperlipidemia, unspecified: Secondary | ICD-10-CM | POA: Insufficient documentation

## 2013-01-10 DIAGNOSIS — I2510 Atherosclerotic heart disease of native coronary artery without angina pectoris: Secondary | ICD-10-CM

## 2013-01-10 DIAGNOSIS — I080 Rheumatic disorders of both mitral and aortic valves: Secondary | ICD-10-CM | POA: Insufficient documentation

## 2013-01-10 DIAGNOSIS — I498 Other specified cardiac arrhythmias: Secondary | ICD-10-CM | POA: Insufficient documentation

## 2013-01-10 DIAGNOSIS — R011 Cardiac murmur, unspecified: Secondary | ICD-10-CM

## 2013-01-10 DIAGNOSIS — I739 Peripheral vascular disease, unspecified: Secondary | ICD-10-CM | POA: Insufficient documentation

## 2013-01-10 DIAGNOSIS — I340 Nonrheumatic mitral (valve) insufficiency: Secondary | ICD-10-CM

## 2013-01-10 DIAGNOSIS — I079 Rheumatic tricuspid valve disease, unspecified: Secondary | ICD-10-CM | POA: Insufficient documentation

## 2013-01-10 DIAGNOSIS — I10 Essential (primary) hypertension: Secondary | ICD-10-CM

## 2013-01-10 DIAGNOSIS — I25810 Atherosclerosis of coronary artery bypass graft(s) without angina pectoris: Secondary | ICD-10-CM | POA: Insufficient documentation

## 2013-01-10 NOTE — Progress Notes (Signed)
Echocardiogram performed.  

## 2013-01-19 ENCOUNTER — Other Ambulatory Visit: Payer: Self-pay

## 2013-01-19 MED ORDER — OLMESARTAN MEDOXOMIL 40 MG PO TABS: 40.0000 mg | ORAL_TABLET | Freq: Every day | ORAL | Status: DC

## 2013-01-27 ENCOUNTER — Ambulatory Visit (INDEPENDENT_AMBULATORY_CARE_PROVIDER_SITE_OTHER)
Admission: RE | Admit: 2013-01-27 | Discharge: 2013-01-27 | Disposition: A | Discharge status: Home / Self Care | Payer: Medicare Other | Source: Ambulatory Visit | Attending: Internal Medicine | Admitting: Internal Medicine

## 2013-01-27 ENCOUNTER — Encounter: Payer: Self-pay | Admitting: Internal Medicine

## 2013-01-27 ENCOUNTER — Ambulatory Visit (INDEPENDENT_AMBULATORY_CARE_PROVIDER_SITE_OTHER): Payer: Medicare Other | Admitting: Internal Medicine

## 2013-01-27 ENCOUNTER — Other Ambulatory Visit (INDEPENDENT_AMBULATORY_CARE_PROVIDER_SITE_OTHER): Payer: Medicare Other

## 2013-01-27 VITALS — BP 140/64 | HR 51 | Temp 97.0°F | Wt 106.8 lb

## 2013-01-27 DIAGNOSIS — M810 Age-related osteoporosis without current pathological fracture: Secondary | ICD-10-CM

## 2013-01-27 DIAGNOSIS — D509 Iron deficiency anemia, unspecified: Secondary | ICD-10-CM

## 2013-01-27 DIAGNOSIS — R1031 Right lower quadrant pain: Secondary | ICD-10-CM

## 2013-01-27 LAB — IBC PANEL
Iron: 45 ug/dL (ref 42–145)
Saturation Ratios: 11.1 % — ABNORMAL LOW (ref 20.0–50.0)
Transferrin: 289.8 mg/dL (ref 212.0–360.0)

## 2013-01-27 LAB — HEMOGLOBIN AND HEMATOCRIT, BLOOD
HCT: 38.6 % (ref 36.0–46.0)
Hemoglobin: 12.9 g/dL (ref 12.0–15.0)

## 2013-01-27 NOTE — Progress Notes (Signed)
Subjective:    Patient ID: [name], female    DOB: 1932/12/08, 79 y.o.   MRN: [id number]  HPI In the interval since her last visit she developed a stress fracture of the right foot 2 weeks ago. Dr. Dareen Piano made the diagnosis: x-ray revealed thin bones. She is wearing a wooden shoe. She still has pain and swelling. She is to return to Dr. Dareen Piano for follow up soon.   Onalee Hua encouraged her to come in: for several weeks she has been feeling weak since she stopped iron replacement.  She is having pain in the right groin with some radiation centrally. Origin seems to be more posterior.   She is having mild nausea, frequent BM, borborygmi.  Past Medical History  Diagnosis Date  . Renal calculus   . PVD (peripheral vascular disease)   . Anemia, pernicious   . Atrophic gastritis   . Hypothyroidism   . HLD (hyperlipidemia)   . HTN (hypertension)   . CAD (coronary artery disease)   . Carotid artery stenosis   . Median arcuate ligament syndrome   . Arthritis     bursitis    Past Surgical History  Procedure Laterality Date  . Coronary artery bypass graft    . Appendectomy    . Biopsy thyroid    . Thyroid lobectomy    . Abdominal hysterectomy    . Esophageal manometry N/A 08/02/2012    Procedure: ESOPHAGEAL MANOMETRY (EM);  Surgeon: Mardella Layman, MD;  Location: [facility];  Service: Endoscopy;  Laterality: N/A;   Family History  Problem Relation Age of Onset  . Heart disease Mother   . Prostate cancer Brother   . Breast cancer Other     niece - breast cancer  . Diabetes Sister   . Lung cancer Father   . Lung cancer Sister    History   Social History  . Marital Status: Married    Spouse Name: N/A    Number of Children: N/A  . Years of Education: 12   Occupational History  . retired   .     Social History Main Topics  . Smoking status: Never Smoker   . Smokeless tobacco: Never Used  . Alcohol Use: No  . Drug Use: No  . Sexual Activity: Not on file    Other Topics Concern  . Not on file   Social History Narrative   [name], Married. Lives with husband. Had been a very active woman.    Current Outpatient Prescriptions on File Prior to Visit  Medication Sig Dispense Refill  . aspirin 81 MG tablet Take 81 mg by mouth daily.        Marland Kitchen atorvastatin (LIPITOR) 80 MG tablet Take 1 tablet (80 mg total) by mouth daily.  30 tablet  11  . Calcium Citrate-Vitamin D (CITRACAL + D PO) Take by mouth daily.        . carvedilol (COREG) 6.25 MG tablet Take 3.125 mg by mouth 2 (two) times daily with a meal.      . cholecalciferol (VITAMIN D) 1000 UNITS tablet Take 2,000 Units by mouth daily.       . ciprofloxacin (CIPRO) 250 MG tablet Take 1 tablet (250 mg total) by mouth 2 (two) times daily.  10 tablet  0  . cyanocobalamin (,VITAMIN B-12,) 1000 MCG/ML injection Inject 1,000 mcg into the muscle every 30 (thirty) days.        Marland Kitchen diltiazem (CARDIZEM CD) 300 MG 24 hr capsule Take  1 capsule (300 mg total) by mouth daily.  30 capsule  11  . fluticasone (FLONASE) 50 MCG/ACT nasal spray Place 2 sprays into the nose daily as needed.  16 g  11  . furosemide (LASIX) 40 MG tablet Take 1 tablet (40 mg total) by mouth daily.  30 tablet  5  . hydrALAZINE (APRESOLINE) 25 MG tablet Take 1 tablet (25 mg total) by mouth 3 (three) times daily.  90 tablet  1  . levothyroxine (SYNTHROID, LEVOTHROID) 75 MCG tablet Take 75 mcg by mouth daily.        . Multiple Vitamins-Minerals (ICAPS) CAPS Take by mouth daily.        Marland Kitchen olmesartan (BENICAR) 40 MG tablet Take 1 tablet (40 mg total) by mouth daily.  30 tablet  5  . ONE TOUCH ULTRA TEST test strip as directed.      . Potassium 99 MG TABS Take by mouth.      . Probiotic Product (ALIGN) 4 MG CAPS Take 1 capsule by mouth daily.  14 capsule  0  . sodium chloride (OCEAN) 0.65 % nasal spray Place 1 spray into the nose as needed.        . sucralfate (CARAFATE) 1 G tablet Take 1 tablet (1 g total) by mouth as needed.  30 tablet  11  .  traMADol (ULTRAM) 50 MG tablet Take 50 mg by mouth every 6 (six) hours as needed.        . zoledronic acid (RECLAST) 5 MG/100ML SOLN Inject 100 mLs (5 mg total) into the vein once. Short Stay  100 mL  0  . ferrous fumarate-iron polysaccharide complex (TANDEM) 162-115.2 MG CAPS Take 1 capsule by mouth daily with breakfast.  30 capsule  2  . olmesartan (BENICAR) 40 MG tablet Take 1 tablet (40 mg total) by mouth daily.  30 tablet  11   No current facility-administered medications on file prior to visit.      Review of Systems System review is negative for any constitutional, cardiac, pulmonary, GI or neuro symptoms or complaints other than as described in the HPI.     Objective:   Physical Exam Filed Vitals:   01/27/13 1533  BP: 140/64  Pulse: 51  Temp: 97 F (36.1 C)   Wt Readings from Last 3 Encounters:  01/27/13 106 lb 12.8 oz (48.444 kg)  12/02/12 107 lb 6.4 oz (48.716 kg)  11/24/12 109 lb (49.442 kg)   Gen'l - thin white woman in no distress, wearing a wooden shoe right foot. Cor- RRR Pulm - normal respirations Abd- not tender, + BS Ext - normal passive ROM hips, no tenderness with pressure against the greater trochanter. Normal weight-bearing and ambulation.  Hip X-ray:BILATERAL HIP WITH PELVIS - 4+ VIEW  COMPARISON: None  FINDINGS:  Osseous demineralization.  Hip and SI joint spaces preserved.  Bone island left ilium.  No acute fracture, dislocation, or bone destruction.  IMPRESSION:  No acute osseous abnormalities.  Osseous demineralization.         Assessment & Plan:  Left groin pain - unremarkable MSK exam and normal x-ray pelvis and hip. Suspect her discomfort is of a muscular origin.  Plan For continued pain - physical therapy consultation for groin pain.

## 2013-01-27 NOTE — Patient Instructions (Addendum)
Right groin pain - no mass or abnormality on exam. Some pain in the area with passive movement of the joint suggesting degenerative hip disease.  Plan X-rays of both hips  Recommendations to follow.  Hip Pain The hips join the upper legs to the lower pelvis. The bones, cartilage, tendons, and muscles of the hip joint perform a lot of work each day holding your body weight and allowing you to move around. Hip pain is a common symptom. It can range from a minor ache to severe pain on 1 or both hips. Pain may be felt on the inside of the hip joint near the groin, or the outside near the buttocks and upper thigh. There may be swelling or stiffness as well. It occurs more often when a person walks or performs activity. There are many reasons hip pain can develop. CAUSES  It is important to work with your caregiver to identify the cause since many conditions can impact the bones, cartilage, muscles, and tendons of the hips. Causes for hip pain include:  Broken (fractured) bones.  Separation of the thighbone from the hip socket (dislocation).  Torn cartilage of the hip joint.  Swelling (inflammation) of a tendon (tendonitis), the sac within the hip joint (bursitis), or a joint.  A weakening in the abdominal wall (hernia), affecting the nerves to the hip.  Arthritis in the hip joint or lining of the hip joint.  Pinched nerves in the back, hip, or upper thigh.  A bulging disc in the spine (herniated disc).  Rarely, bone infection or cancer. DIAGNOSIS  The location of your hip pain will help your caregiver understand what may be causing the pain. A diagnosis is based on your medical history, your symptoms, results from your physical exam, and results from diagnostic tests. Diagnostic tests may include X-ray exams, a computerized magnetic scan (magnetic resonance imaging, MRI), or bone scan. TREATMENT  Treatment will depend on the cause of your hip pain. Treatment may include:  Limiting  activities and resting until symptoms improve.  Crutches or other walking supports (a cane or brace).  Ice, elevation, and compression.  Physical therapy or home exercises.  Shoe inserts or special shoes.  Losing weight.  Medications to reduce pain.  Undergoing surgery. HOME CARE INSTRUCTIONS   Only take over-the-counter or prescription medicines for pain, discomfort, or fever as directed by your caregiver.  Put ice on the injured area:  Put ice in a plastic bag.  Place a towel between your skin and the bag.  Leave the ice on for 15-20 minutes at a time, 3-4 times a day.  Keep your leg raised (elevated) when possible to lessen swelling.  Avoid activities that cause pain.  Follow specific exercises as directed by your caregiver.  Sleep with a pillow between your legs on your most comfortable side.  Record how often you have hip pain, the location of the pain, and what it feels like. This information may be helpful to you and your caregiver.  Ask your caregiver about returning to work or sports and whether you should drive.  Follow up with your caregiver for further exams, therapy, or testing as directed. SEEK MEDICAL CARE IF:   Your pain or swelling continues or worsens after 1 week.  You are feeling unwell or have chills.  You have increasing difficulty with walking.  You have a loss of sensation or other new symptoms.  You have questions or concerns. SEEK IMMEDIATE MEDICAL CARE IF:   You cannot put  weight on the affected hip.  You have fallen.  You have a sudden increase in pain and swelling in your hip.  You have a fever. MAKE SURE YOU:   Understand these instructions.  Will watch your condition.  Will get help right away if you are not doing well or get worse. Document Released: 10/02/2009 Document Revised: 07/07/2011 Document Reviewed: 10/02/2009 [facility] Patient Information 2014 [place], [place].

## 2013-01-28 ENCOUNTER — Encounter: Payer: Self-pay | Admitting: Internal Medicine

## 2013-01-31 NOTE — Assessment & Plan Note (Signed)
Patient with generalized weakness with a h/o iron deficiency anemia.  Plan F/u H/H and Iron levels  Addendum: Fe level normal, % sat a little low, Hgb normal  (pt notified via MyChart)

## 2013-02-14 ENCOUNTER — Other Ambulatory Visit: Payer: Self-pay

## 2013-02-14 MED ORDER — HYDRALAZINE HCL 25 MG PO TABS: 25.0000 mg | ORAL_TABLET | Freq: Three times a day (TID) | ORAL | Status: DC

## 2013-04-25 ENCOUNTER — Other Ambulatory Visit: Payer: Self-pay

## 2013-04-25 MED ORDER — HYDRALAZINE HCL 25 MG PO TABS: 25.0000 mg | ORAL_TABLET | Freq: Three times a day (TID) | ORAL | Status: DC

## 2013-05-09 ENCOUNTER — Other Ambulatory Visit: Payer: Self-pay

## 2013-05-09 MED ORDER — ATORVASTATIN CALCIUM 80 MG PO TABS: 80.0000 mg | ORAL_TABLET | Freq: Every day | ORAL | Status: DC

## 2013-05-23 ENCOUNTER — Telehealth: Payer: Self-pay

## 2013-05-23 NOTE — Telephone Encounter (Signed)
Phone call to patient letting her know Benicar 40 mg samples are at our front desk for pick up. She states he husband will pick them up tomorrow.

## 2013-06-09 ENCOUNTER — Other Ambulatory Visit: Payer: Self-pay

## 2013-06-09 MED ORDER — CARVEDILOL 6.25 MG PO TABS: 3.1250 mg | ORAL_TABLET | Freq: Two times a day (BID) | ORAL | Status: DC

## 2013-06-20 ENCOUNTER — Telehealth: Payer: Self-pay | Admitting: *Deleted

## 2013-06-22 ENCOUNTER — Other Ambulatory Visit: Payer: Medicare Other

## 2013-06-22 ENCOUNTER — Ambulatory Visit (INDEPENDENT_AMBULATORY_CARE_PROVIDER_SITE_OTHER): Payer: Medicare Other | Admitting: Internal Medicine

## 2013-06-22 ENCOUNTER — Encounter: Payer: Self-pay | Admitting: Internal Medicine

## 2013-06-22 VITALS — BP 160/62 | HR 59 | Temp 97.4°F | Wt 105.0 lb

## 2013-06-22 DIAGNOSIS — R109 Unspecified abdominal pain: Secondary | ICD-10-CM

## 2013-06-22 LAB — URINALYSIS, ROUTINE W REFLEX MICROSCOPIC
Bilirubin Urine: NEGATIVE
Hgb urine dipstick: NEGATIVE
Ketones, ur: NEGATIVE
Leukocytes, UA: NEGATIVE
Nitrite: NEGATIVE
Specific Gravity, Urine: 1.02 (ref 1.000–1.030)
Total Protein, Urine: NEGATIVE
Urine Glucose: NEGATIVE
Urobilinogen, UA: 0.2 (ref 0.0–1.0)
WBC, UA: NONE SEEN — AB (ref 0–?)
pH: 7 (ref 5.0–8.0)

## 2013-06-22 NOTE — Progress Notes (Signed)
I took patient's blood pressure a second time and it is 190/72

## 2013-06-22 NOTE — Patient Instructions (Signed)
Pain in the right flank - on exam no mass, no enlarged liver, no rebound to suggest entrapment, not tender to percussion over the kidney. Could this be an unusual presentation of a stone; possibly a pulled muscle.  Plan Urinalysis today  If the urinalysis is normal and the pain continues or gets worse the next diagnostic test will be a CT scan.

## 2013-06-22 NOTE — Progress Notes (Signed)
Pre visit review using our clinic review tool, if applicable. No additional management support is needed unless otherwise documented below in the visit note. 

## 2013-06-22 NOTE — Progress Notes (Signed)
Subjective:    Patient ID: [name], female    DOB: Sep 13, 1932, 80 y.o.   MRN: [id number]  HPI Mrs. Wyeth presents for pain in the right flank, just below her rib cage and above the pelvic crest. The pain started about 1 week ago. No recollection of injury or strain, no urinary track symptoms, no change in urine. Aggravating factor - get up out of a chair hurts, walking hurts. Pain does not keep her up at night. No paresthesia right leg. No meal associated pain.   Past Medical History  Diagnosis Date  . Renal calculus   . PVD (peripheral vascular disease)   . Anemia, pernicious   . Atrophic gastritis   . Hypothyroidism   . HLD (hyperlipidemia)   . HTN (hypertension)   . CAD (coronary artery disease)   . Carotid artery stenosis   . Median arcuate ligament syndrome   . Arthritis     bursitis    Past Surgical History  Procedure Laterality Date  . Coronary artery bypass graft    . Appendectomy    . Biopsy thyroid    . Thyroid lobectomy    . Abdominal hysterectomy    . Esophageal manometry N/A 08/02/2012    Procedure: ESOPHAGEAL MANOMETRY (EM);  Surgeon: Sable Feil, MD;  Location: [facility];  Service: Endoscopy;  Laterality: N/A;   Family History  Problem Relation Age of Onset  . Heart disease Mother   . Prostate cancer Brother   . Breast cancer Other     niece - breast cancer  . Diabetes Sister   . Lung cancer Father   . Lung cancer Sister    History   Social History  . Marital Status: Married    Spouse Name: N/A    Number of Children: N/A  . Years of Education: 12   Occupational History  . retired   .     Social History Main Topics  . Smoking status: Never Smoker   . Smokeless tobacco: Never Used  . Alcohol Use: No  . Drug Use: No  . Sexual Activity: Not on file   Other Topics Concern  . Not on file   Social History Narrative   [name], Married. Lives with husband. Had been a very active woman.    Current Outpatient Prescriptions on  File Prior to Visit  Medication Sig Dispense Refill  . aspirin 81 MG tablet Take 81 mg by mouth daily.        Marland Kitchen atorvastatin (LIPITOR) 80 MG tablet Take 1 tablet (80 mg total) by mouth daily.  30 tablet  11  . Calcium Citrate-Vitamin D (CITRACAL + D PO) Take by mouth daily.        . carvedilol (COREG) 6.25 MG tablet Take 0.5 tablets (3.125 mg total) by mouth 2 (two) times daily with a meal.  60 tablet  5  . cholecalciferol (VITAMIN D) 1000 UNITS tablet Take 2,000 Units by mouth daily.       . cyanocobalamin (,VITAMIN B-12,) 1000 MCG/ML injection Inject 1,000 mcg into the muscle every 30 (thirty) days.        Marland Kitchen diltiazem (CARDIZEM CD) 300 MG 24 hr capsule Take 1 capsule (300 mg total) by mouth daily.  30 capsule  11  . fluticasone (FLONASE) 50 MCG/ACT nasal spray Place 2 sprays into the nose daily as needed.  16 g  11  . furosemide (LASIX) 40 MG tablet Take 1 tablet (40 mg total) by mouth daily.  30 tablet  5  . hydrALAZINE (APRESOLINE) 25 MG tablet Take 1 tablet (25 mg total) by mouth 3 (three) times daily.  90 tablet  1  . levothyroxine (SYNTHROID, LEVOTHROID) 75 MCG tablet Take 75 mcg by mouth daily.        . Multiple Vitamins-Minerals (ICAPS) CAPS Take by mouth daily.        Marland Kitchen olmesartan (BENICAR) 40 MG tablet Take 1 tablet (40 mg total) by mouth daily.  30 tablet  5  . ONE TOUCH ULTRA TEST test strip as directed.      . Potassium 99 MG TABS Take by mouth.      . Probiotic Product (ALIGN) 4 MG CAPS Take 1 capsule by mouth daily.  14 capsule  0  . sodium chloride (OCEAN) 0.65 % nasal spray Place 1 spray into the nose as needed.        . sucralfate (CARAFATE) 1 G tablet Take 1 tablet (1 g total) by mouth as needed.  30 tablet  11  . traMADol (ULTRAM) 50 MG tablet Take 50 mg by mouth every 6 (six) hours as needed.        . zoledronic acid (RECLAST) 5 MG/100ML SOLN Inject 100 mLs (5 mg total) into the vein once. Short Stay  100 mL  0  . olmesartan (BENICAR) 40 MG tablet Take 1 tablet (40 mg  total) by mouth daily.  30 tablet  11   No current facility-administered medications on file prior to visit.      Review of Systems Current Outpatient Prescriptions on File Prior to Visit  Medication Sig Dispense Refill  . aspirin 81 MG tablet Take 81 mg by mouth daily.        Marland Kitchen atorvastatin (LIPITOR) 80 MG tablet Take 1 tablet (80 mg total) by mouth daily.  30 tablet  11  . Calcium Citrate-Vitamin D (CITRACAL + D PO) Take by mouth daily.        . carvedilol (COREG) 6.25 MG tablet Take 0.5 tablets (3.125 mg total) by mouth 2 (two) times daily with a meal.  60 tablet  5  . cholecalciferol (VITAMIN D) 1000 UNITS tablet Take 2,000 Units by mouth daily.       . cyanocobalamin (,VITAMIN B-12,) 1000 MCG/ML injection Inject 1,000 mcg into the muscle every 30 (thirty) days.        Marland Kitchen diltiazem (CARDIZEM CD) 300 MG 24 hr capsule Take 1 capsule (300 mg total) by mouth daily.  30 capsule  11  . fluticasone (FLONASE) 50 MCG/ACT nasal spray Place 2 sprays into the nose daily as needed.  16 g  11  . furosemide (LASIX) 40 MG tablet Take 1 tablet (40 mg total) by mouth daily.  30 tablet  5  . hydrALAZINE (APRESOLINE) 25 MG tablet Take 1 tablet (25 mg total) by mouth 3 (three) times daily.  90 tablet  1  . levothyroxine (SYNTHROID, LEVOTHROID) 75 MCG tablet Take 75 mcg by mouth daily.        . Multiple Vitamins-Minerals (ICAPS) CAPS Take by mouth daily.        Marland Kitchen olmesartan (BENICAR) 40 MG tablet Take 1 tablet (40 mg total) by mouth daily.  30 tablet  5  . ONE TOUCH ULTRA TEST test strip as directed.      . Potassium 99 MG TABS Take by mouth.      . Probiotic Product (ALIGN) 4 MG CAPS Take 1 capsule by mouth daily.  14 capsule  0  . sodium chloride (OCEAN) 0.65 % nasal spray Place 1 spray into the nose as needed.        . sucralfate (CARAFATE) 1 G tablet Take 1 tablet (1 g total) by mouth as needed.  30 tablet  11  . traMADol (ULTRAM) 50 MG tablet Take 50 mg by mouth every 6 (six) hours as needed.        .  zoledronic acid (RECLAST) 5 MG/100ML SOLN Inject 100 mLs (5 mg total) into the vein once. Short Stay  100 mL  0  . olmesartan (BENICAR) 40 MG tablet Take 1 tablet (40 mg total) by mouth daily.  30 tablet  11   No current facility-administered medications on file prior to visit.       Objective:   Physical Exam Filed Vitals:   06/22/13 1537  BP: 160/62  Pulse: 59  Temp: 97.4 F (36.3 C)   Wt Readings from Last 3 Encounters:  06/22/13 105 lb (47.628 kg)  01/27/13 106 lb 12.8 oz (48.444 kg)  12/02/12 107 lb 6.4 oz (48.716 kg)   BP Readings from Last 3 Encounters:  06/22/13 160/62  01/27/13 140/64  12/30/12 180/52   Gen'l- slender woman in no distress Cor - 2+ RRR, quiet precordium PUlm - normal respirations Abd - Hypoactive BS, no HSM, no guarding or rebound, definite tenderness to percussion and deep palpation right abd 4 cm below costal margin, no mass.  Neuro - A&O x 3       Assessment & Plan:  Abdominal pain R flank - no masses on exam. Concern for possible nephrolithiasis or other  Plan U/A for microscopic hematuria  Addendum - U/A negative  Plan For persistent pain will obtain CT abd/pelvis

## 2013-06-23 ENCOUNTER — Encounter: Payer: Self-pay | Admitting: Internal Medicine

## 2013-07-06 ENCOUNTER — Ambulatory Visit (INDEPENDENT_AMBULATORY_CARE_PROVIDER_SITE_OTHER): Payer: Medicare Other | Admitting: Internal Medicine

## 2013-07-06 ENCOUNTER — Encounter: Payer: Self-pay | Admitting: Internal Medicine

## 2013-07-06 VITALS — BP 150/58 | HR 50 | Temp 98.2°F | Resp 14 | Wt 106.5 lb

## 2013-07-06 DIAGNOSIS — R195 Other fecal abnormalities: Secondary | ICD-10-CM

## 2013-07-06 MED ORDER — LOSARTAN POTASSIUM 100 MG PO TABS: 100.0000 mg | ORAL_TABLET | Freq: Every day | ORAL | Status: DC

## 2013-07-06 NOTE — Progress Notes (Signed)
Pre visit review using our clinic review tool, if applicable. No additional management support is needed unless otherwise documented below in the visit note. 

## 2013-07-06 NOTE — Progress Notes (Signed)
Subjective:    Patient ID: [name], female    DOB: [birth year], 80 y.o.   MRN: [id number]  HPI Mrs. Bevans presents for several days of rectal bleeding with blood on the toilet tissue at time of BM. She did have abdominal cramping, no rectal pain. No blood on the underwear. Last colonoscopy '06 - negative study but had a adenomatous polyp in '02. No hemorrhoids were noted on '06 study.  Past Medical History  Diagnosis Date  . Renal calculus   . PVD (peripheral vascular disease)   . Anemia, pernicious   . Atrophic gastritis   . Hypothyroidism   . HLD (hyperlipidemia)   . HTN (hypertension)   . CAD (coronary artery disease)   . Carotid artery stenosis   . Median arcuate ligament syndrome   . Arthritis     bursitis    Past Surgical History  Procedure Laterality Date  . Coronary artery bypass graft    . Appendectomy    . Biopsy thyroid    . Thyroid lobectomy    . Abdominal hysterectomy    . Esophageal manometry N/A 08/02/2012    Procedure: ESOPHAGEAL MANOMETRY (EM);  Surgeon: Sable Feil, MD;  Location: [facility];  Service: Endoscopy;  Laterality: N/A;   Family History  Problem Relation Age of Onset  . Heart disease Mother   . Prostate cancer Brother   . Breast cancer Other     niece - breast cancer  . Diabetes Sister   . Lung cancer Father   . Lung cancer Sister    History   Social History  . Marital Status: Married    Spouse Name: N/A    Number of Children: N/A  . Years of Education: 12   Occupational History  . retired   .     Social History Main Topics  . Smoking status: Never Smoker   . Smokeless tobacco: Never Used  . Alcohol Use: No  . Drug Use: No  . Sexual Activity: Not on file   Other Topics Concern  . Not on file   Social History Narrative   [name], Married. Lives with husband. Had been a very active woman.    Current Outpatient Prescriptions on File Prior to Visit  Medication Sig Dispense Refill  . aspirin 81 MG tablet Take  81 mg by mouth daily.        Marland Kitchen atorvastatin (LIPITOR) 80 MG tablet Take 1 tablet (80 mg total) by mouth daily.  30 tablet  11  . Calcium Citrate-Vitamin D (CITRACAL + D PO) Take by mouth daily.        . carvedilol (COREG) 6.25 MG tablet Take 0.5 tablets (3.125 mg total) by mouth 2 (two) times daily with a meal.  60 tablet  5  . cholecalciferol (VITAMIN D) 1000 UNITS tablet Take 2,000 Units by mouth daily.       . cyanocobalamin (,VITAMIN B-12,) 1000 MCG/ML injection Inject 1,000 mcg into the muscle every 30 (thirty) days.        Marland Kitchen diltiazem (CARDIZEM CD) 300 MG 24 hr capsule Take 1 capsule (300 mg total) by mouth daily.  30 capsule  11  . fluticasone (FLONASE) 50 MCG/ACT nasal spray Place 2 sprays into the nose daily as needed.  16 g  11  . furosemide (LASIX) 40 MG tablet Take 1 tablet (40 mg total) by mouth daily.  30 tablet  5  . hydrALAZINE (APRESOLINE) 25 MG tablet Take 1 tablet (25 mg  total) by mouth 3 (three) times daily.  90 tablet  1  . levothyroxine (SYNTHROID, LEVOTHROID) 75 MCG tablet Take 75 mcg by mouth daily.        . Multiple Vitamins-Minerals (ICAPS) CAPS Take by mouth daily.        Marland Kitchen olmesartan (BENICAR) 40 MG tablet Take 1 tablet (40 mg total) by mouth daily.  30 tablet  11  . olmesartan (BENICAR) 40 MG tablet Take 1 tablet (40 mg total) by mouth daily.  30 tablet  5  . ONE TOUCH ULTRA TEST test strip as directed.      . Potassium 99 MG TABS Take by mouth.      . Probiotic Product (ALIGN) 4 MG CAPS Take 1 capsule by mouth daily.  14 capsule  0  . sodium chloride (OCEAN) 0.65 % nasal spray Place 1 spray into the nose as needed.        . sucralfate (CARAFATE) 1 G tablet Take 1 tablet (1 g total) by mouth as needed.  30 tablet  11  . traMADol (ULTRAM) 50 MG tablet Take 50 mg by mouth every 6 (six) hours as needed.        . zoledronic acid (RECLAST) 5 MG/100ML SOLN Inject 100 mLs (5 mg total) into the vein once. Short Stay  100 mL  0   No current facility-administered medications  on file prior to visit.      Review of Systems System review is negative for any constitutional, cardiac, pulmonary, GI or neuro symptoms or complaints other than as described in the HPI.     Objective:   Physical Exam Filed Vitals:   07/06/13 0906  BP: 150/58  Pulse: 50  Temp: 98.2 F (36.8 C)  Resp: 14   WNWD woman in no distress Cor - RRR Rectal - NST, no external lesions, no palpable internal lesion, mucus heme positive       Assessment & Plan:  Blood in the stool - she has a h/o adenomatous polyp. She is hemodynamically stable  Plan Refer to GI - diagnostic colonoscopy,.

## 2013-07-06 NOTE — Patient Instructions (Signed)
I hope your headache gets better.  On exam no hemorrhoids were seen or felt. There is blood in the mucus/stool. You do have a history of polyps in '02  Plan Referral to GI for further evaluation.

## 2013-07-07 ENCOUNTER — Encounter: Payer: Self-pay | Admitting: Gastroenterology

## 2013-07-13 ENCOUNTER — Other Ambulatory Visit: Payer: Self-pay

## 2013-07-13 MED ORDER — HYDRALAZINE HCL 25 MG PO TABS: 25.0000 mg | ORAL_TABLET | Freq: Three times a day (TID) | ORAL | Status: DC

## 2013-08-01 ENCOUNTER — Telehealth: Payer: Self-pay

## 2013-08-01 ENCOUNTER — Telehealth: Payer: Self-pay | Admitting: *Deleted

## 2013-08-01 MED ORDER — FUROSEMIDE 40 MG PO TABS: 40.0000 mg | ORAL_TABLET | Freq: Every day | ORAL | Status: DC

## 2013-08-01 NOTE — Telephone Encounter (Signed)
The patient called and is hoping to get her Lasix 40mg  refilled.  She knows she is no longer a patient of Dr.Norins and is in the process of finding another physician.  She states, she was told she would be able to get this medicine filled by a different physician in the practice without becoming a patient of that provider   Pharmacy - [facility]

## 2013-08-01 NOTE — Telephone Encounter (Signed)
Wants to know if Dr Burt Knack can refill rx until she has a PCP. Former Dr Linda Hedges pt.

## 2013-08-04 NOTE — Telephone Encounter (Signed)
I spoke with Dr Burt Knack and he is okay with helping fill the pt's medications while they are obtaining a new PCP. I spoke with the pt and she said that Dr Daylene Posey office just called her and they are going to help with prescriptions. She will call back if she has any further problems.

## 2013-08-19 ENCOUNTER — Ambulatory Visit (INDEPENDENT_AMBULATORY_CARE_PROVIDER_SITE_OTHER): Payer: Medicare Other | Admitting: Gastroenterology

## 2013-08-19 ENCOUNTER — Encounter: Payer: Self-pay | Admitting: Gastroenterology

## 2013-08-19 VITALS — BP 140/60 | HR 64 | Ht 64.0 in | Wt 107.6 lb

## 2013-08-19 DIAGNOSIS — R195 Other fecal abnormalities: Secondary | ICD-10-CM

## 2013-08-19 MED ORDER — MOVIPREP 100 G PO SOLR: 1.0000 | Freq: Once | ORAL | Status: DC

## 2013-08-19 NOTE — Patient Instructions (Signed)
You will be set up for a colonoscopy for Hemocult positive stool, change in her bowels.

## 2013-08-19 NOTE — Progress Notes (Signed)
HPI: This is a    very pleasant 80 year old woman whom I am meeting for the first time today.  3rd week of bronchitis, recently added prednisone and has been on abx.  She also had seasonal allergies.  She has had several colonoscopies through Dr. Verl Valdez over the years. Her first was in 1993, her next one was in 2002, her next Was in 2006. I reviewed all of these reports and any available associated pathology. I cannot find any evidence that she has ever had a polyp removed from her colon despite it being written that she had adenomatous polyps removed on index examination.  FOBT testing Dr. Sharlett Valdez 2014 was negative.  Cbc 01/2013 was normal  Was heme + on rectal exam by Dr. Linda Valdez 06/2013.  About 3 weeks with abdominal pains. Post prandial nausea.  This has improved.  Also lower abd discomfort.   Minor change in her bowels.  Her bowels have returned to normal.  The pain has resolved.  Overall her weight has been fairly stable.  She has seen intermittent red blood in her stool   Review of systems: Pertinent positive and negative review of systems were noted in the above HPI section. Complete review of systems was performed and was otherwise normal.    Past Medical History  Diagnosis Date  . Renal calculus   . PVD (peripheral vascular disease)   . Anemia, pernicious   . Atrophic gastritis   . Hypothyroidism   . HLD (hyperlipidemia)   . HTN (hypertension)   . CAD (coronary artery disease)   . Carotid artery stenosis   . Median arcuate ligament syndrome   . Arthritis     bursitis     Past Surgical History  Procedure Laterality Date  . Coronary artery bypass graft    . Appendectomy    . Biopsy thyroid    . Thyroid lobectomy    . Abdominal hysterectomy    . Esophageal manometry N/A 08/02/2012    Procedure: ESOPHAGEAL MANOMETRY (EM);  Surgeon: Sally Feil, MD;  Location: [facility];  Service: Endoscopy;  Laterality: N/A;    Current Outpatient  Prescriptions  Medication Sig Dispense Refill  . aspirin 81 MG tablet Take 81 mg by mouth daily.        Marland Kitchen atorvastatin (LIPITOR) 80 MG tablet Take 1 tablet (80 mg total) by mouth daily.  30 tablet  11  . Calcium Citrate-Vitamin D (CITRACAL + D PO) Take by mouth daily.        . carvedilol (COREG) 6.25 MG tablet Take 0.5 tablets (3.125 mg total) by mouth 2 (two) times daily with a meal.  60 tablet  5  . cholecalciferol (VITAMIN D) 1000 UNITS tablet Take 2,000 Units by mouth daily.       . cyanocobalamin (,VITAMIN B-12,) 1000 MCG/ML injection Inject 1,000 mcg into the muscle every 30 (thirty) days.        Marland Kitchen diltiazem (CARDIZEM CD) 300 MG 24 hr capsule Take 1 capsule (300 mg total) by mouth daily.  30 capsule  11  . fluticasone (FLONASE) 50 MCG/ACT nasal spray Place 2 sprays into the nose daily as needed.  16 g  11  . furosemide (LASIX) 40 MG tablet Take 1 tablet (40 mg total) by mouth daily.  30 tablet  5  . hydrALAZINE (APRESOLINE) 25 MG tablet Take 1 tablet (25 mg total) by mouth 3 (three) times daily.  90 tablet  1  . levothyroxine (SYNTHROID, LEVOTHROID) 75 MCG tablet  Take 75 mcg by mouth daily.        Marland Kitchen losartan (COZAAR) 100 MG tablet Take 1 tablet (100 mg total) by mouth daily.  30 tablet  11  . Multiple Vitamins-Minerals (ICAPS) CAPS Take by mouth daily.        Marland Kitchen olmesartan (BENICAR) 40 MG tablet Take 1 tablet (40 mg total) by mouth daily.  30 tablet  11  . ONE TOUCH ULTRA TEST test strip as directed.      . Potassium 99 MG TABS Take by mouth.      . Probiotic Product (ALIGN) 4 MG CAPS Take 1 capsule by mouth daily.  14 capsule  0  . sodium chloride (OCEAN) 0.65 % nasal spray Place 1 spray into the nose as needed.        . sucralfate (CARAFATE) 1 G tablet Take 1 tablet (1 g total) by mouth as needed.  30 tablet  11  . traMADol (ULTRAM) 50 MG tablet Take 50 mg by mouth every 6 (six) hours as needed.        . zoledronic acid (RECLAST) 5 MG/100ML SOLN Inject 100 mLs (5 mg total) into the vein  once. Short Stay  100 mL  0   No current facility-administered medications for this visit.    Allergies as of 08/19/2013 - Review Complete 07/07/2013  Allergen Reaction Noted  . Codeine  01/08/2007  . Sulfonamide derivatives  01/08/2007    Family History  Problem Relation Age of Onset  . Heart disease Mother   . Prostate cancer Brother   . Breast cancer Other     niece - breast cancer  . Diabetes Sister   . Lung cancer Father   . Lung cancer Sister     History   Social History  . Marital Status: Married    Spouse Name: N/A    Number of Children: N/A  . Years of Education: 12   Occupational History  . retired   .     Social History Main Topics  . Smoking status: Never Smoker   . Smokeless tobacco: Never Used  . Alcohol Use: No  . Drug Use: No  . Sexual Activity: Not on file   Other Topics Concern  . Not on file   Social History Narrative   [name], Married. Lives with husband. Had been a very active woman.       Physical Exam: BP 140/60  Pulse 64  Ht 5\' 4"  (1.626 m)  Wt 107 lb 9.6 oz (48.807 kg)  BMI 18.46 kg/m2 Constitutional: generally well-appearing Psychiatric: alert and oriented x3 Eyes: extraocular movements intact Mouth: oral pharynx moist, no lesions Neck: supple no lymphadenopathy Cardiovascular: heart regular rate and rhythm Lungs: clear to auscultation bilaterally Abdomen: soft, nontender, nondistended, no obvious ascites, no peritoneal signs, normal bowel sounds Extremities: no lower extremity edema bilaterally Skin: no lesions on visible extremities  rectal exam deferred for upcoming colonoscopy   Assessment and plan: 80 y.o. female with  Hemoccult-positive stool, recent change in bowels, lower abdominal discomfort  I would like to proceed with colonoscopy at her soonest convenience. Her abdominal discomfort and her change in bowels have really returned to normal however she is Hemoccult-positive on examination by her primary care  physician last month. She does not have a history of adenomatous polyps despite charting otherwise in previous records.

## 2013-08-25 ENCOUNTER — Encounter: Payer: Self-pay | Admitting: Family Medicine

## 2013-08-25 ENCOUNTER — Ambulatory Visit (INDEPENDENT_AMBULATORY_CARE_PROVIDER_SITE_OTHER): Payer: Medicare Other | Admitting: Family Medicine

## 2013-08-25 VITALS — BP 132/68 | HR 70 | Wt 106.0 lb

## 2013-08-25 DIAGNOSIS — E785 Hyperlipidemia, unspecified: Secondary | ICD-10-CM

## 2013-08-25 DIAGNOSIS — I2510 Atherosclerotic heart disease of native coronary artery without angina pectoris: Secondary | ICD-10-CM

## 2013-08-25 DIAGNOSIS — Z23 Encounter for immunization: Secondary | ICD-10-CM

## 2013-08-25 DIAGNOSIS — I10 Essential (primary) hypertension: Secondary | ICD-10-CM

## 2013-08-25 DIAGNOSIS — M810 Age-related osteoporosis without current pathological fracture: Secondary | ICD-10-CM

## 2013-08-25 DIAGNOSIS — E039 Hypothyroidism, unspecified: Secondary | ICD-10-CM

## 2013-08-25 NOTE — Progress Notes (Signed)
Pre visit review using our clinic review tool, if applicable. No additional management support is needed unless otherwise documented below in the visit note. 

## 2013-08-25 NOTE — Progress Notes (Signed)
Subjective:    Patient ID: [name], female    DOB: 11/16/1932, 80 y.o.   MRN: [id number]  HPI Patient is seen to establish care. She has multiple past medical problems. History of hypothyroidism, hyperlipidemia, hypertension, CAD, peripheral vascular disease, history of kidney stone, osteoporosis. She is seen by multiple specialists including cardiology, endocrinology, in rheumatology. It was thought at one point she had lupus but this apparently was ruled out.  She has bilateral carotid disease with carotid stenosis 40-59% range and gets yearly carotid Dopplers. She is followed by endocrinology regarding her hypothyroidism as well as osteoporosis and hyperlipidemia.  Hypertension with multiple medications. Blood pressure stable. No recent orthostasis.  She has had cough mostly nonproductive over past month or so. She took cough suppressants intermittently. No fever. No chills. No dyspnea. She was recently placed on course of prednisone for some wheezing and that has helped her cough somewhat. She is not aware of any obvious wheezing at this time. No reported appetite or weight changes. No history of Prevnar 13.  Past Medical History  Diagnosis Date  . Renal calculus   . PVD (peripheral vascular disease)   . Anemia, pernicious   . Atrophic gastritis   . Hypothyroidism   . HLD (hyperlipidemia)   . HTN (hypertension)   . CAD (coronary artery disease)   . Carotid artery stenosis   . Median arcuate ligament syndrome   . Arthritis     bursitis   . Irritable bowel syndrome (IBS)    Past Surgical History  Procedure Laterality Date  . Coronary artery bypass graft    . Appendectomy    . Biopsy thyroid    . Thyroid lobectomy    . Abdominal hysterectomy    . Esophageal manometry N/A 08/02/2012    Procedure: ESOPHAGEAL MANOMETRY (EM);  Surgeon: Sable Feil, MD;  Location: [facility];  Service: Endoscopy;  Laterality: N/A;    reports that she has never smoked. She has never  used smokeless tobacco. She reports that she does not drink alcohol or use illicit drugs. family history includes Breast cancer in her other; Diabetes in her sister; Heart disease in her mother; Lung cancer in her father and sister; Prostate cancer in her brother; Stomach cancer in her cousin. There is no history of Colon cancer or Esophageal cancer. Allergies  Allergen Reactions  . Codeine   . Sulfonamide Derivatives       Review of Systems  Constitutional: Negative for fatigue and unexpected weight change.  Eyes: Negative for visual disturbance.  Respiratory: Negative for cough, chest tightness, shortness of breath and wheezing.   Cardiovascular: Negative for chest pain, palpitations and leg swelling.  Endocrine: Negative for polydipsia and polyuria.  Neurological: Negative for dizziness, seizures, syncope, weakness, light-headedness and headaches.       Objective:   Physical Exam  Constitutional: She appears well-developed and well-nourished.  Neck: Neck supple. No thyromegaly present.  Cardiovascular: Normal rate and regular rhythm.   Pulmonary/Chest: Effort normal and breath sounds normal. No respiratory distress. She has no wheezes. She has no rales.  Musculoskeletal: She exhibits no edema.          Assessment & Plan:  #1 hypothyroidism. Followed by endocrinology #2 history of peripheral vascular disease. She gets yearly carotid Dopplers through vascular lab #3 health maintenance. Prevnar 13 given #4 hypertension which is stable. Continue current medications #5 hyperlipidemia. Continue Lipitor. Lipids are also being followed through endocrinology #6 osteoporosis. She gets once yearly Reclast and  takes regular calcium and vitamin D. No recent falls #7 recent cough. Presumably secondary to acute bronchitis. Nonfocal exam today cough is slowly improving. Observe for now

## 2013-08-26 ENCOUNTER — Telehealth: Payer: Self-pay | Admitting: Family Medicine

## 2013-08-26 DIAGNOSIS — D126 Benign neoplasm of colon, unspecified: Secondary | ICD-10-CM

## 2013-08-26 HISTORY — DX: Benign neoplasm of colon, unspecified: D12.6

## 2013-08-26 NOTE — Telephone Encounter (Signed)
emmi mailed to patient °

## 2013-08-31 ENCOUNTER — Ambulatory Visit (AMBULATORY_SURGERY_CENTER): Payer: Medicare Other | Admitting: Gastroenterology

## 2013-08-31 ENCOUNTER — Encounter: Payer: Self-pay | Admitting: Gastroenterology

## 2013-08-31 VITALS — BP 167/65 | HR 56 | Temp 98.1°F | Resp 16 | Ht 64.0 in | Wt 107.0 lb

## 2013-08-31 DIAGNOSIS — D126 Benign neoplasm of colon, unspecified: Secondary | ICD-10-CM

## 2013-08-31 DIAGNOSIS — R195 Other fecal abnormalities: Secondary | ICD-10-CM

## 2013-08-31 MED ORDER — SODIUM CHLORIDE 0.9 % IV SOLN: 500.0000 mL | INTRAVENOUS | Status: DC

## 2013-08-31 NOTE — Progress Notes (Signed)
Called to room to assist during endoscopic procedure.  Patient ID and intended procedure confirmed with present staff. Received instructions for my participation in the procedure from the performing physician.  

## 2013-08-31 NOTE — Progress Notes (Signed)
  [facility] Post-op Note  Patient: [name]  Procedure(s) Performed: colonoscopy  Patient Location: [facility] - Recovery Area  Anesthesia Type: Deep Sedation/Propofol  Level of Consciousness: awake, oriented and patient cooperative  Airway and Oxygen Therapy: Patient Spontanous Breathing  Post-op Pain: none  Post-op Assessment:  Post-op Vital signs reviewed, Patient's Cardiovascular Status Stable, Respiratory Function Stable, Patent Airway, No signs of Nausea or vomiting and Pain level controlled  Post-op Vital Signs: Reviewed and stable  Complications: No apparent anesthesia complications  Myrtha Tonkovich E Ellsie Violette 10:50 AM

## 2013-08-31 NOTE — Patient Instructions (Signed)
YOU HAD AN ENDOSCOPIC PROCEDURE TODAY AT THE [facility]: Refer to the procedure report that was given to you for any specific questions about what was found during the examination.  If the procedure report does not answer your questions, please call your gastroenterologist to clarify.  If you requested that your care partner not be given the details of your procedure findings, then the procedure report has been included in a sealed envelope for you to review at your convenience later.  YOU SHOULD EXPECT: Some feelings of bloating in the abdomen. Passage of more gas than usual.  Walking can help get rid of the air that was put into your GI tract during the procedure and reduce the bloating. If you had a lower endoscopy (such as a colonoscopy or flexible sigmoidoscopy) you may notice spotting of blood in your stool or on the toilet paper. If you underwent a bowel prep for your procedure, then you may not have a normal bowel movement for a few days.  DIET: Your first meal following the procedure should be a light meal and then it is ok to progress to your normal diet.  A half-sandwich or bowl of soup is an example of a good first meal.  Heavy or fried foods are harder to digest and may make you feel nauseous or bloated.  Likewise meals heavy in dairy and vegetables can cause extra gas to form and this can also increase the bloating.  Drink plenty of fluids but you should avoid alcoholic beverages for 24 hours.  ACTIVITY: Your care partner should take you home directly after the procedure.  You should plan to take it easy, moving slowly for the rest of the day.  You can resume normal activity the day after the procedure however you should NOT DRIVE or use heavy machinery for 24 hours (because of the sedation medicines used during the test).    SYMPTOMS TO REPORT IMMEDIATELY: A gastroenterologist can be reached at any hour.  During normal business hours, 8:30 AM to 5:00 PM Monday through Friday,  call [phone].  After hours and on weekends, please call the GI answering service at [phone] who will take a message and have the physician on call contact you.   Following lower endoscopy (colonoscopy or flexible sigmoidoscopy):  Excessive amounts of blood in the stool  Significant tenderness or worsening of abdominal pains  Swelling of the abdomen that is new, acute  Fever of 100F or higher  FOLLOW UP: If any biopsies were taken you will be contacted by phone or by letter within the next 1-3 weeks.  Call your gastroenterologist if you have not heard about the biopsies in 3 weeks.  Our staff will call the home number listed on your records the next business day following your procedure to check on you and address any questions or concerns that you may have at that time regarding the information given to you following your procedure. This is a courtesy call and so if there is no answer at the home number and we have not heard from you through the emergency physician on call, we will assume that you have returned to your regular daily activities without incident.  SIGNATURES/CONFIDENTIALITY: You and/or your care partner have signed paperwork which will be entered into your electronic medical record.  These signatures attest to the fact that that the information above on your After Visit Summary has been reviewed and is understood.  Full responsibility of the confidentiality of this   discharge information lies with you and/or your care-partner.    Resume medications. Information given on polyps with discharge instructions. 

## 2013-08-31 NOTE — Op Note (Signed)
[facility]  [address] [place], [address]   COLONOSCOPY PROCEDURE REPORT  PATIENT: [name], [name]  MR#: [id number] BIRTHDATE: 11-08-1932 , 17  yrs. old GENDER: Female ENDOSCOPIST: Milus Banister, MD PROCEDURE DATE:  08/31/2013 PROCEDURE:   Colonoscopy with snare polypectomy First Screening Colonoscopy - Avg.  risk and is 50 yrs.  old or older - No.  Prior Negative Screening - Now for repeat screening. N/A  History of Adenoma - Now for follow-up colonoscopy & has been > or = to 3 yrs.  N/A  Polyps Removed Today? Yes. ASA CLASS:   Class II INDICATIONS:FOBT positive stool. MEDICATIONS: MAC sedation, administered by CRNA and propofol (Diprivan) 150mg  IV  DESCRIPTION OF PROCEDURE:   After the risks benefits and alternatives of the procedure were thoroughly explained, informed consent was obtained.  A digital rectal exam revealed no abnormalities of the rectum.   The LB [id number]  endoscope was introduced through the anus and advanced to the cecum, which was identified by both the appendix and ileocecal valve. No adverse events experienced.   The quality of the prep was excellent.  The instrument was then slowly withdrawn as the colon was fully examined.  COLON FINDINGS: Two polyps were found, removed and sent to pathology.  One was 98mm across, sessile, located in descending segment, removed with cold snare.  The other was 1.5cm across, heaped up, soft, located in rectum (10cm from anus).  This was removed with snare/cautery and sent to path (jar 2).  The examination was otherwise normal.  Retroflexed views revealed no abnormalities. The time to cecum=3 minutes 42 seconds.  Withdrawal time=10 minutes 55 seconds.  The scope was withdrawn and the procedure completed. COMPLICATIONS: There were no complications.  ENDOSCOPIC IMPRESSION: Two polyps were found, removed and sent to pathology. The examination was otherwise  normal.  RECOMMENDATIONS: Await pathology results   eSigned:  Milus Banister, MD 08/31/2013 10:50 AM   cc: Eduard Clos, MD

## 2013-09-01 ENCOUNTER — Telehealth: Payer: Self-pay | Admitting: *Deleted

## 2013-09-01 NOTE — Telephone Encounter (Signed)
  Follow up Call-  Call back number 08/31/2013  Post procedure Call Back phone  # [phone]  Permission to leave phone message Yes     Patient questions:  Do you have a fever, pain , or abdominal swelling? no Pain Score  0 *  Have you tolerated food without any problems? yes  Have you been able to return to your normal activities? yes  Do you have any questions about your discharge instructions: Diet   no Medications  no Follow up visit  no  Do you have questions or concerns about your Care? no  Actions: * If pain score is 4 or above: No action needed, pain <4.

## 2013-09-09 ENCOUNTER — Encounter: Payer: Self-pay | Admitting: Gastroenterology

## 2013-09-13 ENCOUNTER — Ambulatory Visit: Payer: Medicare Other | Admitting: Family Medicine

## 2013-09-23 ENCOUNTER — Telehealth: Payer: Self-pay | Admitting: Family Medicine

## 2013-09-23 MED ORDER — HYDRALAZINE HCL 25 MG PO TABS: 25.0000 mg | ORAL_TABLET | Freq: Three times a day (TID) | ORAL | Status: DC

## 2013-09-23 NOTE — Telephone Encounter (Signed)
[name], [name] is requesting a re-fill on hydrALAZINE (APRESOLINE) 25 MG tablet

## 2013-09-23 NOTE — Telephone Encounter (Signed)
Rx sent to pharmacy   

## 2013-10-06 ENCOUNTER — Ambulatory Visit (INDEPENDENT_AMBULATORY_CARE_PROVIDER_SITE_OTHER): Payer: Medicare Other | Admitting: Cardiovascular Disease

## 2013-10-06 ENCOUNTER — Encounter: Payer: Self-pay | Admitting: Cardiovascular Disease

## 2013-10-06 VITALS — BP 169/64 | HR 67 | Ht 64.0 in | Wt 103.0 lb

## 2013-10-06 DIAGNOSIS — I739 Peripheral vascular disease, unspecified: Secondary | ICD-10-CM

## 2013-10-06 DIAGNOSIS — E785 Hyperlipidemia, unspecified: Secondary | ICD-10-CM

## 2013-10-06 DIAGNOSIS — I2510 Atherosclerotic heart disease of native coronary artery without angina pectoris: Secondary | ICD-10-CM

## 2013-10-06 NOTE — Patient Instructions (Addendum)
We will request recent lab results from Dr Cindra Eves office.  If you did not have a CBC checked then we will order this test.  Your physician wants you to follow-up in: 1 YEAR with Dr Burt Knack. You will receive a reminder letter in the mail two months in advance. If you don't receive a letter, please call our office to schedule the follow-up appointment.  Your physician has requested that you have an echocardiogram in 1 YEAR. Echocardiography is a painless test that uses sound waves to create images of your heart. It provides your doctor with information about the size and shape of your heart and how well your heart's chambers and valves are working. This procedure takes approximately one hour. There are no restrictions for this procedure.  Your physician recommends that you continue on your current medications as directed. Please refer to the Current Medication list given to you today.  Your physician has requested that you have a carotid duplex. This test is an ultrasound of the carotid arteries in your neck. It looks at blood flow through these arteries that supply the brain with blood. Allow one hour for this exam. There are no restrictions or special instructions.

## 2013-10-08 NOTE — Progress Notes (Signed)
HPI:  [name] returns for followup evaluation. She is a 80 year old woman who was seen last one year ago. She has a history of coronary artery disease status post CABG in 2007. She's also followed for asymptomatic carotid stenosis and moderate mitral regurgitation. She's had malignant hypertension and has had some difficulty tolerating antihypertensive medicines. She has a wide pulse pressure.  The patient reports a difficult Spring as she had a long bout with bronchitis. Has several complaints including generalized fatigue, dyspnea with exertion, and dizziness. No reports of near syncope. Minor chest pain but no exertional component. Pain located in sternal area. Also complains of red spots on hands and concerned about these. No associated pain in hands.  Outpatient Encounter Prescriptions as of 10/06/2013  Medication Sig  . albuterol (PROVENTIL HFA;VENTOLIN HFA) 108 (90 BASE) MCG/ACT inhaler Inhale 1 puff into the lungs every 6 (six) hours as needed for wheezing or shortness of breath.  Marland Kitchen aspirin 81 MG tablet Take 81 mg by mouth daily.    Marland Kitchen atorvastatin (LIPITOR) 80 MG tablet Take 1 tablet (80 mg total) by mouth daily.  . Calcium Citrate-Vitamin D (CITRACAL + D PO) Take by mouth daily.    . carvedilol (COREG) 6.25 MG tablet Take 0.5 tablets (3.125 mg total) by mouth 2 (two) times daily with a meal.  . cholecalciferol (VITAMIN D) 1000 UNITS tablet Take 2,000 Units by mouth daily.   . cyanocobalamin (,VITAMIN B-12,) 1000 MCG/ML injection Inject 1,000 mcg into the muscle every 30 (thirty) days.    Marland Kitchen diltiazem (CARDIZEM CD) 300 MG 24 hr capsule Take 1 capsule (300 mg total) by mouth daily.  . fluticasone (FLONASE) 50 MCG/ACT nasal spray Place 2 sprays into the nose daily as needed.  . furosemide (LASIX) 40 MG tablet Take 1 tablet (40 mg total) by mouth daily.  . hydrALAZINE (APRESOLINE) 25 MG tablet Take 1 tablet (25 mg total) by mouth 3 (three) times daily.  Marland Kitchen levothyroxine (SYNTHROID,  LEVOTHROID) 75 MCG tablet Take 75 mcg by mouth daily.    Marland Kitchen losartan (COZAAR) 100 MG tablet Take 1 tablet (100 mg total) by mouth daily.  . Multiple Vitamins-Minerals (ICAPS) CAPS Take by mouth daily.    . ONE TOUCH ULTRA TEST test strip as directed.  . Potassium 99 MG TABS Take by mouth.  . Probiotic Product (ALIGN) 4 MG CAPS Take 1 capsule by mouth daily.  . sodium chloride (OCEAN) 0.65 % nasal spray Place 1 spray into the nose as needed.    . sucralfate (CARAFATE) 1 G tablet Take 1 tablet (1 g total) by mouth as needed.  . traMADol (ULTRAM) 50 MG tablet Take 50 mg by mouth every 6 (six) hours as needed.   . zoledronic acid (RECLAST) 5 MG/100ML SOLN Inject 100 mLs (5 mg total) into the vein once. Short Stay  . [DISCONTINUED] chlorpheniramine-HYDROcodone (TUSSIONEX) 10-8 MG/5ML LQCR Take 5 mLs by mouth every 6 (six) hours as needed for cough.    Allergies  Allergen Reactions  . Actos [Pioglitazone]     Swelling  . Codeine   . Sulfonamide Derivatives     Past Medical History  Diagnosis Date  . Renal calculus   . PVD (peripheral vascular disease)   . Anemia, pernicious   . Atrophic gastritis   . Hypothyroidism   . HLD (hyperlipidemia)   . HTN (hypertension)   . CAD (coronary artery disease)   . Carotid artery stenosis   . Median arcuate ligament syndrome   .  Arthritis     bursitis   . Irritable bowel syndrome (IBS)     ROS: Negative except as per HPI  BP 169/64  Pulse 67  Ht 5\' 4"  (1.626 m)  Wt 46.72 kg (103 lb)  BMI 17.67 kg/m2  PHYSICAL EXAM: Pt is alert and oriented, pleasant, thin elderly woman in NAD HEENT: normal Neck: JVP - normal, carotids 2+= with bilateral bruits Lungs: CTA bilaterally CV: RRR with a paradoxical S2 and grade 2/6 holosystolic murmur heard at the apex  Abd: soft, NT, Positive BS, no hepatomegaly Ext: no C/C/E, distal pulses intact and equal Skin: warm/dry, petechial rash on hands  EKG:  Sinus bradycardia 56 beats per minute, left bundle  branch block. No change from last year's tracing.  2-D echocardiogram 01/10/2013: Study Conclusions  - Left ventricle: The cavity size was normal. There was mild focal basal hypertrophy of the septum. Systolic function was normal. The estimated ejection fraction was in the range of 55% to 60%. Wall motion was normal; there were no regional wall motion abnormalities. Doppler parameters are consistent with abnormal left ventricular relaxation (grade 1 diastolic dysfunction). Doppler parameters are consistent with high ventricular filling pressure. - Aortic valve: Mild regurgitation. - Mitral valve: Mild regurgitation.  ASSESSMENT AND PLAN: 1. Coronary artery disease, native vessel. The patient is stable without anginal symptoms. She's had previous CABG. Will continue her medical program without changes. Chest pain is not consistent with angina.  2. Carotid stenosis no history of stroke or TIA. Continue medical therapy with aspirin and a statin drug. Last doppler showed stable 40-59% bilateral ICA stenosis. Due for a repeat study.  3. Mitral regurgitation, mild-moderate. Will repeat an echocardiogram in September at a one-year interval from her previous. Her exam is consistent with moderate MR.  4. Hypertension. Reports BP earlier today an another MD office was 130/70. Will continue same Rx.  5. Petechial rash: will review recent labs from Dr Cindra Eves office. Will add a CBC if this hasn't been checked. No signs of major bleeding.  I'll see her back in one year for followup unless problems arise  Sherren Mocha 10/08/2013 2:31 PM

## 2013-10-13 ENCOUNTER — Other Ambulatory Visit (HOSPITAL_COMMUNITY): Payer: Self-pay | Admitting: Cardiology

## 2013-10-13 DIAGNOSIS — I6529 Occlusion and stenosis of unspecified carotid artery: Secondary | ICD-10-CM

## 2013-10-19 ENCOUNTER — Ambulatory Visit (HOSPITAL_COMMUNITY): Payer: Medicare Other | Attending: Cardiology | Admitting: Cardiology

## 2013-10-19 DIAGNOSIS — Z951 Presence of aortocoronary bypass graft: Secondary | ICD-10-CM | POA: Insufficient documentation

## 2013-10-19 DIAGNOSIS — I10 Essential (primary) hypertension: Secondary | ICD-10-CM | POA: Insufficient documentation

## 2013-10-19 DIAGNOSIS — E785 Hyperlipidemia, unspecified: Secondary | ICD-10-CM | POA: Insufficient documentation

## 2013-10-19 DIAGNOSIS — I6529 Occlusion and stenosis of unspecified carotid artery: Secondary | ICD-10-CM

## 2013-10-19 DIAGNOSIS — I2510 Atherosclerotic heart disease of native coronary artery without angina pectoris: Secondary | ICD-10-CM | POA: Insufficient documentation

## 2013-10-19 DIAGNOSIS — I658 Occlusion and stenosis of other precerebral arteries: Secondary | ICD-10-CM | POA: Insufficient documentation

## 2013-10-19 NOTE — Progress Notes (Signed)
Carotid duplex completed 

## 2013-11-10 ENCOUNTER — Telehealth: Payer: Self-pay | Admitting: Gastroenterology

## 2013-11-10 NOTE — Telephone Encounter (Signed)
Pt has epigastric discomfort with nausea when she eats,  Loose stools with occasional fecal incontinence.  She is using carafate tablet with little relief.  I spoke with Dr Hilarie Fredrickson who suggested she try omeprazole OTC 40 mg q am and make a slurry with her carafate.  She was advised and I will send to Dr Ardis Hughs for review and further recommendations.

## 2013-11-11 NOTE — Telephone Encounter (Signed)
I have scheduled her for an office visit with Tye Savoy RNP for 11/17/13 at 2:00.  Patient is aware of the appt date and time.

## 2013-11-11 NOTE — Telephone Encounter (Signed)
I agree, needs rov, next available with me or extender.  Thanks

## 2013-11-16 ENCOUNTER — Encounter: Payer: Self-pay | Admitting: *Deleted

## 2013-11-17 ENCOUNTER — Encounter: Payer: Self-pay | Admitting: Nurse Practitioner

## 2013-11-17 ENCOUNTER — Other Ambulatory Visit (INDEPENDENT_AMBULATORY_CARE_PROVIDER_SITE_OTHER): Payer: Medicare Other

## 2013-11-17 ENCOUNTER — Ambulatory Visit (INDEPENDENT_AMBULATORY_CARE_PROVIDER_SITE_OTHER): Payer: Medicare Other | Admitting: Nurse Practitioner

## 2013-11-17 VITALS — BP 178/68 | HR 72 | Ht 64.0 in | Wt 102.0 lb

## 2013-11-17 DIAGNOSIS — D510 Vitamin B12 deficiency anemia due to intrinsic factor deficiency: Secondary | ICD-10-CM

## 2013-11-17 DIAGNOSIS — K219 Gastro-esophageal reflux disease without esophagitis: Secondary | ICD-10-CM

## 2013-11-17 DIAGNOSIS — R110 Nausea: Secondary | ICD-10-CM

## 2013-11-17 DIAGNOSIS — R1013 Epigastric pain: Secondary | ICD-10-CM

## 2013-11-17 DIAGNOSIS — R634 Abnormal weight loss: Secondary | ICD-10-CM

## 2013-11-17 LAB — CBC
HCT: 41.6 % (ref 36.0–46.0)
Hemoglobin: 13.5 g/dL (ref 12.0–15.0)
MCHC: 32.6 g/dL (ref 30.0–36.0)
MCV: 84.2 fl (ref 78.0–100.0)
Platelets: 210 10*3/uL (ref 150.0–400.0)
RBC: 4.94 Mil/uL (ref 3.87–5.11)
RDW: 15 % (ref 11.5–15.5)
WBC: 5.4 10*3/uL (ref 4.0–10.5)

## 2013-11-17 NOTE — Progress Notes (Signed)
     History of Present Illness:   Patient is a 80 year old female known to Dr. Ardis Hughs. She was evaluated late April for Hemoccult positive stools, abdominal pain and bowel changes. Colonoscopy done early May with removal of an adenomatous colon polyp and a tubulovillous adenomatous polyp (with high-grade dysplasia).    Patient comes in today for what she describes as a flareup of gastritis. Last week patient called the office, was advised to take an antacid which helped. No further pain but complains of belching and postprandial nausea . She has taken Carafate as needed for years and now taking it on a regular basis. She has not started any new medications other than losartan 3 months ago. Her weight is down 5-6 pounds over the last couple of months. Patient unsure when the symptoms actually started although I see she mentioned abdominal pain and nausea at her visit here in late April. She takes a baby aspirin, no other NSAIDs. Patient apparently had labs done at [facility] recently, I have requested those.   Current Medications, Allergies, Past Medical History, Past Surgical History, Family History and Social History were reviewed in [employer] record.  Physical Exam: General: Pleasant, thin, white female in no acute distress Head: Normocephalic and atraumatic Eyes:  sclerae anicteric, conjunctiva pink  Ears: Normal auditory acuity Lungs: Clear throughout to auscultation Heart: Regular rate and rhythm Abdomen: Soft, non distended, non-tender. No masses, no hepatomegaly. Normal bowel sounds Musculoskeletal: Symmetrical with no gross deformities  Extremities: No edema  Neurological: Alert oriented x 4, grossly nonfocal Psychological:  Alert and cooperative. Normal mood and affect  Assessment and Recommendations:   80 year old female with at least a two month history of intermittent epigastric pain, belching and postprandial nausea associated with a documented 5 pound  weight loss. Recent comprehensive metabolic profile by PCP was unremarkable. Will check a CBC. We discussed upper endoscopy as well as ultrasound. Patient hesitant about doing EGD. Will obtain ultrasound of the abdomen but if negative we may need to proceed with upper endoscopy if she is willing. Will discuss this again with her at time ultrasound results given. She is concerned about adding a daily PPI. Follow up with Dr. Ardis Hughs in 2 months

## 2013-11-17 NOTE — Patient Instructions (Addendum)
You have been scheduled for an endoscopy with Dr. Ardis Hughs. Please follow written instructions given to you at your visit today. If you use inhalers (even only as needed), please bring them with you on the day of your procedure. Your physician has requested that you go to www.startemmi.com and enter the access code given to you at your visit today. This web site gives a general overview about your procedure. However, you should still follow specific instructions given to you by our office regarding your preparation for the procedure.  You have a follow up visit scheduled with Dr. Ardis Hughs for January 27, 2014 at 845 am. To reschedule you can call [phone].  You have been scheduled for an abdominal ultrasound at [facility] (1st floor of hospital) on 11-22-2013 at 8 am. Please arrive 15 minutes prior to your appointment for registration. Make certain not to have anything to eat or drink 6 hours prior to your appointment. Should you need to reschedule your appointment, please contact radiology at [phone]. This test typically takes about 30 minutes to perform.  Your physician has requested that you go to the basement for the following lab work before leaving today: CBC

## 2013-11-18 DIAGNOSIS — R634 Abnormal weight loss: Secondary | ICD-10-CM | POA: Insufficient documentation

## 2013-11-18 DIAGNOSIS — R110 Nausea: Secondary | ICD-10-CM | POA: Insufficient documentation

## 2013-11-18 DIAGNOSIS — R1013 Epigastric pain: Secondary | ICD-10-CM | POA: Insufficient documentation

## 2013-11-20 NOTE — Progress Notes (Signed)
I agree with the plan above 

## 2013-11-21 ENCOUNTER — Encounter: Payer: Self-pay | Admitting: Gastroenterology

## 2013-11-22 ENCOUNTER — Ambulatory Visit (HOSPITAL_COMMUNITY)
Admission: RE | Admit: 2013-11-22 | Discharge: 2013-11-22 | Disposition: A | Discharge status: Home / Self Care | Payer: Medicare Other | Source: Ambulatory Visit | Attending: Nurse Practitioner | Admitting: Nurse Practitioner

## 2013-11-22 DIAGNOSIS — N281 Cyst of kidney, acquired: Secondary | ICD-10-CM | POA: Insufficient documentation

## 2013-11-22 DIAGNOSIS — K824 Cholesterolosis of gallbladder: Secondary | ICD-10-CM | POA: Diagnosis not present

## 2013-11-22 DIAGNOSIS — K219 Gastro-esophageal reflux disease without esophagitis: Secondary | ICD-10-CM

## 2013-11-22 DIAGNOSIS — D510 Vitamin B12 deficiency anemia due to intrinsic factor deficiency: Secondary | ICD-10-CM

## 2013-11-22 DIAGNOSIS — K8020 Calculus of gallbladder without cholecystitis without obstruction: Secondary | ICD-10-CM | POA: Insufficient documentation

## 2013-11-22 DIAGNOSIS — R634 Abnormal weight loss: Secondary | ICD-10-CM | POA: Diagnosis not present

## 2013-11-22 DIAGNOSIS — R110 Nausea: Secondary | ICD-10-CM

## 2013-11-23 ENCOUNTER — Telehealth: Payer: Self-pay | Admitting: Family Medicine

## 2013-11-23 MED ORDER — CARVEDILOL 6.25 MG PO TABS: 3.1250 mg | ORAL_TABLET | Freq: Two times a day (BID) | ORAL | Status: DC

## 2013-11-23 NOTE — Telephone Encounter (Signed)
Rx sent to pharmacy   

## 2013-11-23 NOTE — Telephone Encounter (Signed)
[place], [place] is requesting re-fill on diltiazem (CARDIZEM CD) 300 MG 24 hr capsule

## 2013-11-28 ENCOUNTER — Telehealth: Payer: Self-pay | Admitting: Family Medicine

## 2013-11-28 MED ORDER — DILTIAZEM HCL ER COATED BEADS 300 MG PO CP24: 300.0000 mg | ORAL_CAPSULE | Freq: Every day | ORAL | Status: DC

## 2013-11-28 NOTE — Telephone Encounter (Signed)
Rx sent to pharmacy   

## 2013-11-28 NOTE — Telephone Encounter (Signed)
Pt req rx on diltiazem (CARDIZEM CD) 300 MG 24 hr capsule   Pharmacy; [facility]    Phone number; [phone]

## 2013-11-29 ENCOUNTER — Telehealth: Payer: Self-pay | Admitting: Gastroenterology

## 2013-11-29 NOTE — Telephone Encounter (Signed)
Returned pt's call @1650 . Pt called because she had 3 diarrhea loose stools and did not know whether to come for EGD tomorrow . Diarrhea seems to have stopped now and with pt's history and reason for EGD ,told pt I felt like that was another reason she may need to continue with the procedure . Pt said she also felt like she needed to have the procedure. Pt.and I discussed keeping herself hydrated with gatorade and crackers if she could tolerate i and unless she gets worse or shows other symptoms to procede with endoscopy.

## 2013-11-30 ENCOUNTER — Ambulatory Visit (AMBULATORY_SURGERY_CENTER): Payer: Medicare Other | Admitting: Gastroenterology

## 2013-11-30 ENCOUNTER — Encounter: Payer: Self-pay | Admitting: Gastroenterology

## 2013-11-30 VITALS — BP 156/50 | HR 48 | Temp 98.9°F | Resp 20 | Ht 64.0 in | Wt 102.0 lb

## 2013-11-30 DIAGNOSIS — K2940 Chronic atrophic gastritis without bleeding: Secondary | ICD-10-CM

## 2013-11-30 DIAGNOSIS — K2970 Gastritis, unspecified, without bleeding: Secondary | ICD-10-CM

## 2013-11-30 DIAGNOSIS — K2990 Gastroduodenitis, unspecified, without bleeding: Secondary | ICD-10-CM

## 2013-11-30 DIAGNOSIS — K3189 Other diseases of stomach and duodenum: Secondary | ICD-10-CM

## 2013-11-30 DIAGNOSIS — K219 Gastro-esophageal reflux disease without esophagitis: Secondary | ICD-10-CM

## 2013-11-30 MED ORDER — SODIUM CHLORIDE 0.9 % IV SOLN: 500.0000 mL | INTRAVENOUS | Status: DC

## 2013-11-30 MED ORDER — OMEPRAZOLE 20 MG PO CPDR: 20.0000 mg | DELAYED_RELEASE_CAPSULE | Freq: Every day | ORAL | Status: DC

## 2013-11-30 NOTE — Patient Instructions (Signed)
Discharge instructions given with verbal understanding. Biopsies taken. Resume previous medications. YOU HAD AN ENDOSCOPIC PROCEDURE TODAY AT THE [facility]: Refer to the procedure report that was given to you for any specific questions about what was found during the examination.  If the procedure report does not answer your questions, please call your gastroenterologist to clarify.  If you requested that your care partner not be given the details of your procedure findings, then the procedure report has been included in a sealed envelope for you to review at your convenience later.  YOU SHOULD EXPECT: Some feelings of bloating in the abdomen. Passage of more gas than usual.  Walking can help get rid of the air that was put into your GI tract during the procedure and reduce the bloating. If you had a lower endoscopy (such as a colonoscopy or flexible sigmoidoscopy) you may notice spotting of blood in your stool or on the toilet paper. If you underwent a bowel prep for your procedure, then you may not have a normal bowel movement for a few days.  DIET: Your first meal following the procedure should be a light meal and then it is ok to progress to your normal diet.  A half-sandwich or bowl of soup is an example of a good first meal.  Heavy or fried foods are harder to digest and may make you feel nauseous or bloated.  Likewise meals heavy in dairy and vegetables can cause extra gas to form and this can also increase the bloating.  Drink plenty of fluids but you should avoid alcoholic beverages for 24 hours.  ACTIVITY: Your care partner should take you home directly after the procedure.  You should plan to take it easy, moving slowly for the rest of the day.  You can resume normal activity the day after the procedure however you should NOT DRIVE or use heavy machinery for 24 hours (because of the sedation medicines used during the test).    SYMPTOMS TO REPORT IMMEDIATELY: A gastroenterologist  can be reached at any hour.  During normal business hours, 8:30 AM to 5:00 PM Monday through Friday, call [phone].  After hours and on weekends, please call the GI answering service at [phone] who will take a message and have the physician on call contact you.   Following upper endoscopy (EGD)  Vomiting of blood or coffee ground material  New chest pain or pain under the shoulder blades  Painful or persistently difficult swallowing  New shortness of breath  Fever of 100F or higher  Black, tarry-looking stools  FOLLOW UP: If any biopsies were taken you will be contacted by phone or by letter within the next 1-3 weeks.  Call your gastroenterologist if you have not heard about the biopsies in 3 weeks.  Our staff will call the home number listed on your records the next business day following your procedure to check on you and address any questions or concerns that you may have at that time regarding the information given to you following your procedure. This is a courtesy call and so if there is no answer at the home number and we have not heard from you through the emergency physician on call, we will assume that you have returned to your regular daily activities without incident.  SIGNATURES/CONFIDENTIALITY: You and/or your care partner have signed paperwork which will be entered into your electronic medical record.  These signatures attest to the fact that that the information above on your After   Visit Summary has been reviewed and is understood.  Full responsibility of the confidentiality of this discharge information lies with you and/or your care-partner. 

## 2013-11-30 NOTE — Progress Notes (Signed)
A/ox3, pleased with MAC, report to RN 

## 2013-11-30 NOTE — Op Note (Signed)
[place]  [employer] [place], [id number]   ENDOSCOPY PROCEDURE REPORT  PATIENT: [name], [name]  MR#: [id number] BIRTHDATE: 21-Apr-1933 , 78  yrs. old GENDER: Female ENDOSCOPIST: Milus Banister, MD PROCEDURE DATE:  11/30/2013 PROCEDURE:  EGD w/ biopsy ASA CLASS:     Class III INDICATIONS:  nausea, dyspepsia. MEDICATIONS: MAC sedation, administered by CRNA and Propofol (Diprivan) 160 mg IV TOPICAL ANESTHETIC: none  DESCRIPTION OF PROCEDURE: After the risks benefits and alternatives of the procedure were thoroughly explained, informed consent was obtained.  The LB [id number] endoscope was introduced through the mouth and advanced to the second portion of the duodenum. Without limitations.  The instrument was slowly withdrawn as the mucosa was fully examined.   There was mild to moderate pan-gastritis.  The antrum was biopsied and sent to pathology.  There was a 37mm soft polypoid nodule in mid body of the stomach, this was biopsied and sent to pathology.  The examination was otherwise normal.  Retroflexed views revealed no abnormalities.     The scope was then withdrawn from the patient and the procedure completed.  COMPLICATIONS: There were no complications. ENDOSCOPIC IMPRESSION: There was mild to moderate pan-gastritis.  The antrum was biopsied and sent to pathology.  There was a 44mm soft polypoid nodule in mid body of the stomach, this was biopsied and sent to pathology.  The examination was otherwise normal.  RECOMMENDATIONS: Await final pathology results.  For now, please start once daily OTC prilosec or prevacid for your gastritis.  If biopsies show H. pylori, you will be started on appropriate antibiotics.  If there is no H.  pylori and your symptoms do not improve after 3-4 weeks of DAILY antiacid medicine, will likely refer you to a general surgeon to consider gallbladder removal for possibly symptomatic gallstones.   eSigned:   Milus Banister, MD 11/30/2013 11:31 AM   CC: Carolann Littler, MD

## 2013-12-01 ENCOUNTER — Telehealth: Payer: Self-pay | Admitting: *Deleted

## 2013-12-01 NOTE — Telephone Encounter (Signed)
  Follow up Call-  Call back number 11/30/2013 08/31/2013  Post procedure Call Back phone  # [phone]  Permission to leave phone message Yes Yes     Patient questions:  Do you have a fever, pain , or abdominal swelling? No. Pain Score  0 *  Have you tolerated food without any problems? Yes.    Have you been able to return to your normal activities? Yes.    Do you have any questions about your discharge instructions: Diet   No. Medications  No. Follow up visit  No.  Do you have questions or concerns about your Care? No.  Actions: * If pain score is 4 or above: No action needed, pain <4.

## 2013-12-06 ENCOUNTER — Encounter: Payer: Self-pay | Admitting: Gastroenterology

## 2013-12-29 ENCOUNTER — Other Ambulatory Visit: Payer: Self-pay

## 2013-12-29 MED ORDER — HYDRALAZINE HCL 25 MG PO TABS: 25.0000 mg | ORAL_TABLET | Freq: Three times a day (TID) | ORAL | Status: DC

## 2014-01-16 ENCOUNTER — Ambulatory Visit (HOSPITAL_COMMUNITY): Payer: Medicare Other

## 2014-01-16 ENCOUNTER — Ambulatory Visit (HOSPITAL_COMMUNITY)
Admission: RE | Admit: 2014-01-16 | Discharge: 2014-01-16 | Disposition: A | Discharge status: Home / Self Care | Payer: Medicare Other | Source: Ambulatory Visit | Attending: Internal Medicine | Admitting: Internal Medicine

## 2014-01-16 ENCOUNTER — Other Ambulatory Visit (HOSPITAL_COMMUNITY): Payer: Self-pay | Admitting: Internal Medicine

## 2014-01-16 DIAGNOSIS — M810 Age-related osteoporosis without current pathological fracture: Secondary | ICD-10-CM | POA: Diagnosis not present

## 2014-01-16 MED ORDER — SODIUM CHLORIDE 0.9 % IV SOLN
Freq: Once | INTRAVENOUS | Status: AC
  Administered 2014-01-16: 11:00:00 via INTRAVENOUS

## 2014-01-16 MED ORDER — ZOLEDRONIC ACID 5 MG/100ML IV SOLN
5.0000 mg | Freq: Once | INTRAVENOUS | Status: AC
  Administered 2014-01-16: 5 mg via INTRAVENOUS
  Filled 2014-01-16: qty 100

## 2014-01-16 NOTE — Progress Notes (Signed)
Pt's BP on arrival to Short Stay elevated and at discharge elevated, pt. To follow-up with primary doctor regarding BP. Pt. Stated" Sometimes my medication quits working and they have to change it around.

## 2014-01-16 NOTE — Discharge Instructions (Signed)

## 2014-01-18 ENCOUNTER — Ambulatory Visit (INDEPENDENT_AMBULATORY_CARE_PROVIDER_SITE_OTHER): Payer: Medicare Other | Admitting: Family Medicine

## 2014-01-18 ENCOUNTER — Encounter: Payer: Self-pay | Admitting: Family Medicine

## 2014-01-18 VITALS — BP 180/60 | HR 59 | Wt 103.0 lb

## 2014-01-18 DIAGNOSIS — I10 Essential (primary) hypertension: Secondary | ICD-10-CM

## 2014-01-18 MED ORDER — SPIRONOLACTONE 25 MG PO TABS: 25.0000 mg | ORAL_TABLET | Freq: Every day | ORAL | Status: DC

## 2014-01-18 NOTE — Progress Notes (Signed)
Pre visit review using our clinic review tool, if applicable. No additional management support is needed unless otherwise documented below in the visit note. 

## 2014-01-18 NOTE — Patient Instructions (Addendum)
HOLD potassium supplement for now Start Spironolactone once daily

## 2014-01-18 NOTE — Progress Notes (Signed)
Subjective:    Patient ID: [name], female    DOB: 04-15-1933, 80 y.o.   MRN: [id number]  Hypertension Associated symptoms include headaches. Pertinent negatives include no chest pain, palpitations or shortness of breath.   Patient has long standing history of resistant hypertension. She's had several elevated readings recently as high as 192/60. She's also some occasional headaches. She takes several medications for blood pressure currently including Coreg, diltiazem, furosemide, hydralazine, and losartan. She's compliant with therapy. Low sodium diet. No alcohol.  Patient had CT angiogram 2009 no renal artery stenosis. She thinks she may have had previous intolerance with amlodipine but is not sure. She had a skin rash with some type of blood pressure medication previously  Her chronic problems include history of gastritis, peripheral vascular disease, CAD, hypothyroidism, hyperlipidemia, osteoporosis. Nonsmoker  Past Medical History  Diagnosis Date  . Renal calculus   . PVD (peripheral vascular disease)   . Anemia, pernicious   . Atrophic gastritis   . Hypothyroidism   . HLD (hyperlipidemia)   . HTN (hypertension)   . CAD (coronary artery disease)   . Carotid artery stenosis   . Median arcuate ligament syndrome   . Arthritis     bursitis   . Irritable bowel syndrome (IBS)   . Tubular adenoma of colon 08/2013    with focal high grade dysplasia  . B12 deficiency   . Gallbladder polyp    Past Surgical History  Procedure Laterality Date  . Coronary artery bypass graft    . Appendectomy    . Biopsy thyroid    . Thyroid lobectomy    . Abdominal hysterectomy    . Esophageal manometry N/A 08/02/2012    Procedure: ESOPHAGEAL MANOMETRY (EM);  Surgeon: Sable Feil, MD;  Location: [facility];  Service: Endoscopy;  Laterality: N/A;    reports that she has never smoked. She has never used smokeless tobacco. She reports that she does not drink alcohol or use illicit  drugs. family history includes Breast cancer in her other; Cholelithiasis in her mother; Diabetes in her sister; Heart disease in her mother; Lung cancer in her father and sister; Prostate cancer in her brother; Stomach cancer in her cousin. There is no history of Colon cancer or Esophageal cancer. Allergies  Allergen Reactions  . Actos [Pioglitazone]     Swelling  . Codeine   . Sulfonamide Derivatives       Review of Systems  Constitutional: Negative for fatigue.  Eyes: Negative for visual disturbance.  Respiratory: Negative for cough, chest tightness, shortness of breath and wheezing.   Cardiovascular: Negative for chest pain, palpitations and leg swelling.  Neurological: Positive for headaches. Negative for dizziness, seizures, syncope, weakness and light-headedness.       Objective:   Physical Exam  Constitutional: She is oriented to person, place, and time. She appears well-developed and well-nourished. No distress.  Cardiovascular: Normal rate and regular rhythm.  Exam reveals no gallop.   Murmur heard. Pulmonary/Chest: Effort normal and breath sounds normal. No respiratory distress. She has no wheezes. She has no rales.  Musculoskeletal: She exhibits no edema.  Neurological: She is alert and oriented to person, place, and time.          Assessment & Plan:  Resistant hypertension. Patient currently on five drug regimen. No evidence for renal artery stenosis by previous screening test and these were several years ago. She has isolated systolic hypertension. History of normal renal function by most recent labs. Start Aldactone  25 mg once daily. Hold potassium supplement. Continue other medications. Followup in 2 weeks and check basic metabolic panel then. Consider renal duplex scan if not improving with the above. Also, confirm she's had previous intolerance to amlodipine-if not, might consider changing diltiazem to amlodipine.  We took her blood pressure today both sitting  and standing and obtained 338 systolic with both readings.

## 2014-01-25 ENCOUNTER — Telehealth: Payer: Self-pay | Admitting: Gastroenterology

## 2014-01-26 NOTE — Telephone Encounter (Signed)
Pt aware not kepp appt for friday

## 2014-01-27 ENCOUNTER — Ambulatory Visit: Payer: Medicare Other | Admitting: Gastroenterology

## 2014-02-01 ENCOUNTER — Encounter: Payer: Self-pay | Admitting: Family Medicine

## 2014-02-01 ENCOUNTER — Ambulatory Visit (INDEPENDENT_AMBULATORY_CARE_PROVIDER_SITE_OTHER): Payer: Medicare Other | Admitting: Family Medicine

## 2014-02-01 VITALS — BP 170/60 | HR 55 | Temp 97.6°F | Wt 103.0 lb

## 2014-02-01 DIAGNOSIS — I10 Essential (primary) hypertension: Secondary | ICD-10-CM

## 2014-02-01 DIAGNOSIS — Z23 Encounter for immunization: Secondary | ICD-10-CM

## 2014-02-01 LAB — BASIC METABOLIC PANEL
BUN: 18 mg/dL (ref 6–23)
CO2: 25 mEq/L (ref 19–32)
Calcium: 9.4 mg/dL (ref 8.4–10.5)
Chloride: 105 mEq/L (ref 96–112)
Creatinine, Ser: 1 mg/dL (ref 0.4–1.2)
GFR: 57.47 mL/min — ABNORMAL LOW (ref >60.00)
Glucose, Bld: 84 mg/dL (ref 70–99)
Potassium: 3.5 mEq/L (ref 3.5–5.1)
Sodium: 140 mEq/L (ref 135–145)

## 2014-02-01 MED ORDER — AMLODIPINE BESYLATE 5 MG PO TABS: 5.0000 mg | ORAL_TABLET | Freq: Every day | ORAL | Status: DC

## 2014-02-01 NOTE — Progress Notes (Signed)
Pre visit review using our clinic review tool, if applicable. No additional management support is needed unless otherwise documented below in the visit note. 

## 2014-02-01 NOTE — Patient Instructions (Signed)
Stop the Diltiazem Start Amlodipine 5 mg once daily

## 2014-02-01 NOTE — Progress Notes (Signed)
   Subjective:    Patient ID: [name], female    DOB: 1933/02/23, 80 y.o.   MRN: [id number]  HPI Followup persistent hypertension. Refer to recent note  "Patient has long standing history of resistant hypertension. She's had several elevated readings recently as high as 192/60. She's also some occasional headaches. She takes several medications for blood pressure currently including Coreg, diltiazem, furosemide, hydralazine, and losartan. She's compliant with therapy. Low sodium diet. No alcohol.  Patient had CT angiogram 2009 no renal artery stenosis. She thinks she may have had previous intolerance with amlodipine but is not sure. She had a skin rash with some type of blood pressure medication previously "  We held her potassium and started aldosterone antagonist with Aldactone. She has not seen much improvement a home readings yet. No headaches. No chest pains. No increased edema. Nonsmoker. No alcohol use. No nonsteroidal use.  Past Medical History  Diagnosis Date  . Renal calculus   . PVD (peripheral vascular disease)   . Anemia, pernicious   . Atrophic gastritis   . Hypothyroidism   . HLD (hyperlipidemia)   . HTN (hypertension)   . CAD (coronary artery disease)   . Carotid artery stenosis   . Median arcuate ligament syndrome   . Arthritis     bursitis   . Irritable bowel syndrome (IBS)   . Tubular adenoma of colon 08/2013    with focal high grade dysplasia  . B12 deficiency   . Gallbladder polyp    Past Surgical History  Procedure Laterality Date  . Coronary artery bypass graft    . Appendectomy    . Biopsy thyroid    . Thyroid lobectomy    . Abdominal hysterectomy    . Esophageal manometry N/A 08/02/2012    Procedure: ESOPHAGEAL MANOMETRY (EM);  Surgeon: Sable Feil, MD;  Location: [facility];  Service: Endoscopy;  Laterality: N/A;    reports that she has never smoked. She has never used smokeless tobacco. She reports that she does not drink alcohol or use  illicit drugs. family history includes Breast cancer in her other; Cholelithiasis in her mother; Diabetes in her sister; Heart disease in her mother; Lung cancer in her father and sister; Prostate cancer in her brother; Stomach cancer in her cousin. There is no history of Colon cancer or Esophageal cancer. Allergies  Allergen Reactions  . Actos [Pioglitazone]     Swelling  . Codeine   . Sulfonamide Derivatives          Review of Systems  Constitutional: Negative for fatigue.  Eyes: Negative for visual disturbance.  Respiratory: Negative for cough, chest tightness, shortness of breath and wheezing.   Cardiovascular: Negative for chest pain, palpitations and leg swelling.  Neurological: Negative for dizziness, seizures, syncope, weakness, light-headedness and headaches.       Objective:   Physical Exam  Constitutional: She appears well-developed and well-nourished.  Cardiovascular: Normal rate and regular rhythm.   Pulmonary/Chest: Effort normal and breath sounds normal. No respiratory distress. She has no wheezes. She has no rales.  Musculoskeletal: She exhibits no edema.          Assessment & Plan:  Resistant hypertension. She has basically isolated systolic high blood pressure. Try amlodipine 5 mg daily in place of diltiazem. Continue all other medications. Check basic metabolic panel. Followup in 2 weeks. Renal duplex scan at that point if not improving

## 2014-02-15 ENCOUNTER — Encounter: Payer: Self-pay | Admitting: Family Medicine

## 2014-02-15 ENCOUNTER — Ambulatory Visit (INDEPENDENT_AMBULATORY_CARE_PROVIDER_SITE_OTHER): Payer: Medicare Other | Admitting: Family Medicine

## 2014-02-15 VITALS — BP 150/74 | HR 68 | Temp 98.0°F | Wt 100.0 lb

## 2014-02-15 DIAGNOSIS — I10 Essential (primary) hypertension: Secondary | ICD-10-CM

## 2014-02-15 NOTE — Progress Notes (Signed)
   Subjective:    Patient ID: [name], female    DOB: 24-Nov-1932, 80 y.o.   MRN: [id number]  Abdominal Pain Associated symptoms include nausea. Pertinent negatives include no vomiting.   Patient is here for followup hypertension. She has resistant hypertension has had previous renal duplex scan did not show any renal artery stenosis. We had switched her from diltiazem to amlodipine last visit for persistent elevated systolic blood pressure. She also remains on losartan, carvedilol, spirinolactone, furosemide, and hydralazine. Blood pressure is improved around 712 range systolic.  She's had frequent abdominal cramping and some intermittent nausea without vomiting and diarrhea. She's had chronic GI complaints and had EGD over the summer which showed some nonspecific gastritis. She has refused acid suppressors in the past. She is encouraged to followup with GI  Past Medical History  Diagnosis Date  . Renal calculus   . PVD (peripheral vascular disease)   . Anemia, pernicious   . Atrophic gastritis   . Hypothyroidism   . HLD (hyperlipidemia)   . HTN (hypertension)   . CAD (coronary artery disease)   . Carotid artery stenosis   . Median arcuate ligament syndrome   . Arthritis     bursitis   . Irritable bowel syndrome (IBS)   . Tubular adenoma of colon 08/2013    with focal high grade dysplasia  . B12 deficiency   . Gallbladder polyp    Past Surgical History  Procedure Laterality Date  . Coronary artery bypass graft    . Appendectomy    . Biopsy thyroid    . Thyroid lobectomy    . Abdominal hysterectomy    . Esophageal manometry N/A 08/02/2012    Procedure: ESOPHAGEAL MANOMETRY (EM);  Surgeon: Sable Feil, MD;  Location: [facility];  Service: Endoscopy;  Laterality: N/A;    reports that she has never smoked. She has never used smokeless tobacco. She reports that she does not drink alcohol or use illicit drugs. family history includes Breast cancer in her other;  Cholelithiasis in her mother; Diabetes in her sister; Heart disease in her mother; Lung cancer in her father and sister; Prostate cancer in her brother; Stomach cancer in her cousin. There is no history of Colon cancer or Esophageal cancer. Allergies  Allergen Reactions  . Actos [Pioglitazone]     Swelling  . Codeine   . Sulfonamide Derivatives       Review of Systems  Constitutional: Negative for appetite change and unexpected weight change.  Respiratory: Negative for shortness of breath.   Cardiovascular: Negative for chest pain and leg swelling.  Gastrointestinal: Positive for nausea and abdominal pain. Negative for vomiting and blood in stool.       Objective:   Physical Exam  Constitutional: She appears well-developed and well-nourished.  Neck: Neck supple. No thyromegaly present.  Cardiovascular: Normal rate and regular rhythm.   Pulmonary/Chest: Effort normal and breath sounds normal. No respiratory distress. She has no wheezes. She has no rales.  Musculoskeletal: She exhibits no edema.          Assessment & Plan:  Hypertension. Improved with repeat reading left arm seated 152/70. This is almost 20 points down compared to last visit.  recommend continue amlodipine and offer other usual medications. Routine followup 3 months. She is encouraged followup with GI regarding her chronic abdominal issues

## 2014-03-09 ENCOUNTER — Telehealth: Payer: Self-pay | Admitting: *Deleted

## 2014-03-09 MED ORDER — SUCRALFATE 1 GM/10ML PO SUSP: 1.0000 g | Freq: Two times a day (BID) | ORAL | Status: DC

## 2014-03-09 NOTE — Telephone Encounter (Signed)
The pt has been given carafate suspension instead of tablet, she states it does not work as well.  She will keep appt with Dr Ardis Hughs

## 2014-04-13 ENCOUNTER — Telehealth: Payer: Self-pay

## 2014-04-13 MED ORDER — HYDRALAZINE HCL 25 MG PO TABS: 25.0000 mg | ORAL_TABLET | Freq: Three times a day (TID) | ORAL | Status: DC

## 2014-04-13 NOTE — Telephone Encounter (Signed)
Rx request for hydralazine HCL 25 mg tab- Atke 1 tablet by mouth 3 times daily #90  Pharm:  [facility]

## 2014-04-13 NOTE — Telephone Encounter (Signed)
Rx sent to pharmacy   

## 2014-04-17 ENCOUNTER — Encounter: Payer: Self-pay | Admitting: Gastroenterology

## 2014-04-17 ENCOUNTER — Ambulatory Visit (INDEPENDENT_AMBULATORY_CARE_PROVIDER_SITE_OTHER): Payer: Medicare Other | Admitting: Gastroenterology

## 2014-04-17 ENCOUNTER — Other Ambulatory Visit (INDEPENDENT_AMBULATORY_CARE_PROVIDER_SITE_OTHER): Payer: Medicare Other

## 2014-04-17 VITALS — BP 140/50 | HR 72 | Ht 64.0 in | Wt 100.0 lb

## 2014-04-17 DIAGNOSIS — R109 Unspecified abdominal pain: Secondary | ICD-10-CM

## 2014-04-17 DIAGNOSIS — R634 Abnormal weight loss: Secondary | ICD-10-CM

## 2014-04-17 LAB — COMPREHENSIVE METABOLIC PANEL
ALT: 37 U/L — ABNORMAL HIGH (ref 0–35)
AST: 34 U/L (ref 0–37)
Albumin: 4 g/dL (ref 3.5–5.2)
Alkaline Phosphatase: 46 U/L (ref 39–117)
BUN: 17 mg/dL (ref 6–23)
CO2: 30 mEq/L (ref 19–32)
Calcium: 8.8 mg/dL (ref 8.4–10.5)
Chloride: 106 mEq/L (ref 96–112)
Creatinine, Ser: 0.8 mg/dL (ref 0.4–1.2)
GFR: 77.93 mL/min (ref >60.00)
Glucose, Bld: 70 mg/dL (ref 70–99)
Potassium: 3.4 mEq/L — ABNORMAL LOW (ref 3.5–5.1)
Sodium: 143 mEq/L (ref 135–145)
Total Bilirubin: 0.5 mg/dL (ref 0.2–1.2)
Total Protein: 7 g/dL (ref 6.0–8.3)

## 2014-04-17 LAB — CBC WITH DIFFERENTIAL/PLATELET
Basophils Absolute: 0 10*3/uL (ref 0.0–0.1)
Basophils Relative: 0.5 % (ref 0.0–3.0)
Eosinophils Absolute: 0 10*3/uL (ref 0.0–0.7)
Eosinophils Relative: 0 % (ref 0.0–5.0)
HCT: 39.9 % (ref 36.0–46.0)
Hemoglobin: 13 g/dL (ref 12.0–15.0)
Lymphocytes Relative: 20 % (ref 12.0–46.0)
Lymphs Abs: 1.4 10*3/uL (ref 0.7–4.0)
MCHC: 32.6 g/dL (ref 30.0–36.0)
MCV: 85 fl (ref 78.0–100.0)
Monocytes Absolute: 0.7 10*3/uL (ref 0.1–1.0)
Monocytes Relative: 10.1 % (ref 3.0–12.0)
Neutro Abs: 4.8 10*3/uL (ref 1.4–7.7)
Neutrophils Relative %: 69.4 % (ref 43.0–77.0)
Platelets: 202 10*3/uL (ref 150.0–400.0)
RBC: 4.69 Mil/uL (ref 3.87–5.11)
RDW: 16.3 % — ABNORMAL HIGH (ref 11.5–15.5)
WBC: 6.9 10*3/uL (ref 4.0–10.5)

## 2014-04-17 NOTE — Patient Instructions (Addendum)
You will have labs checked today in the basement lab.  Please head down after you check out with the front desk  (cbc, cmet).   You have been scheduled for a CT scan of the abdomen and pelvis at [facility] ([address]---this is in the same building as [facility]).   You are scheduled on 04/19/14 at 1030 am. You should arrive 15 minutes prior to your appointment time for registration. Please follow the written instructions below on the day of your exam:  WARNING: IF YOU ARE ALLERGIC TO IODINE/X-RAY DYE, PLEASE NOTIFY RADIOLOGY IMMEDIATELY AT [phone]! YOU WILL BE GIVEN A 13 HOUR PREMEDICATION PREP.  1) Do not eat or drink anything after 630 am (4 hours prior to your test) 2) You have been given 2 bottles of oral contrast to drink. The solution may taste better if refrigerated, but do NOT add ice or any other liquid to this solution. Shake well before drinking.    Drink 1 bottle of contrast @ 930 am (2 hours prior to your exam)  Drink 1 bottle of contrast @ 830 am (1 hour prior to your exam)  You may take any medications as prescribed with a small amount of water except for the following: Metformin, Glucophage, Glucovance, Avandamet, Riomet, Fortamet, Actoplus Met, Janumet, Glumetza or Metaglip. The above medications must be held the day of the exam AND 48 hours after the exam.  The purpose of you drinking the oral contrast is to aid in the visualization of your intestinal tract. The contrast solution may cause some diarrhea. Before your exam is started, you will be given a small amount of fluid to drink. Depending on your individual set of symptoms, you may also receive an intravenous injection of x-ray contrast/dye. Plan on being at [facility] for 30 minutes or long, depending on the type of exam you are having performed.  This test typically takes 30-45 minutes to complete.  If you have any questions regarding your exam or if you need to reschedule, you may  call the CT department at [phone] between the hours of 8:00 am and 5:00 pm, Monday-Friday.  ________________________________________________________________________  If these tests are not helpful, you will likely be referred to see surgeon to consider GB removal.

## 2014-04-17 NOTE — Progress Notes (Signed)
Review of pertinent gastrointestinal problems: 1. Adenomatous polyps in colon: several colonoscopies Dr. Verl Blalock over the years. Her first was in 1993, her next one was in 2002, her next was in 2006. No polyps removed in those 3 procedures. Colonoscopy Dr. Ardis Hughs 08/2013 (for FOBT + stool); 2 polyps removed, both TAs, the largest (1.5cm rectum) contained HGD but appeared completely removed endoscopically. 2. Nausea, dyspepsia: EGD Ardis Hughs 12/2013 found mild to moderate gastritis, biopsies from stomach showed no H. Pylori, no neoplasm 3. Cholelithiasis: noted on [place] 10/2013   HPI: This is a  very pleasant 80 year old woman whom I last saw at the time of an upper endoscopy 2-3 months ago. See those results summarized above.  Has been feeling poorly for months.   Carafate is helping,  Has 3-4 good days per week.    Can still have episodes of abd pains, last for 2-3 hours, epigastric, associated with nausea.  Also has alternating bowel habits (loose to scibbolous stools).  Weighed 107#  08/2013 in GI office.  Today 100#    Past Medical History  Diagnosis Date  . Renal calculus   . PVD (peripheral vascular disease)   . Anemia, pernicious   . Atrophic gastritis   . Hypothyroidism   . HLD (hyperlipidemia)   . HTN (hypertension)   . CAD (coronary artery disease)   . Carotid artery stenosis   . Median arcuate ligament syndrome   . Arthritis     bursitis   . Irritable bowel syndrome (IBS)   . Tubular adenoma of colon 08/2013    with focal high grade dysplasia  . B12 deficiency   . Gallbladder polyp     Past Surgical History  Procedure Laterality Date  . Coronary artery bypass graft    . Appendectomy    . Biopsy thyroid    . Thyroid lobectomy    . Abdominal hysterectomy    . Esophageal manometry N/A 08/02/2012    Procedure: ESOPHAGEAL MANOMETRY (EM);  Surgeon: Sable Feil, MD;  Location: [facility];  Service: Endoscopy;  Laterality: N/A;    Current Outpatient  Prescriptions  Medication Sig Dispense Refill  . amLODipine (NORVASC) 5 MG tablet Take 1 tablet (5 mg total) by mouth daily. 30 tablet 6  . aspirin 81 MG tablet Take 81 mg by mouth daily.      Marland Kitchen atorvastatin (LIPITOR) 80 MG tablet Take 1 tablet (80 mg total) by mouth daily. 30 tablet 11  . Calcium Citrate-Vitamin D (CITRACAL + D PO) Take by mouth daily.      . carvedilol (COREG) 6.25 MG tablet Take 0.5 tablets (3.125 mg total) by mouth 2 (two) times daily with a meal. 60 tablet 5  . cholecalciferol (VITAMIN D) 1000 UNITS tablet Take 2,000 Units by mouth daily.     . cyanocobalamin (,VITAMIN B-12,) 1000 MCG/ML injection Inject 1,000 mcg into the muscle every 30 (thirty) days.      . fluticasone (FLONASE) 50 MCG/ACT nasal spray Place 2 sprays into the nose daily as needed. 16 g 11  . furosemide (LASIX) 40 MG tablet Take 1 tablet (40 mg total) by mouth daily. 30 tablet 5  . hydrALAZINE (APRESOLINE) 25 MG tablet Take 1 tablet (25 mg total) by mouth 3 (three) times daily. 90 tablet 2  . levothyroxine (SYNTHROID, LEVOTHROID) 75 MCG tablet Take 75 mcg by mouth daily.      Marland Kitchen losartan (COZAAR) 100 MG tablet Take 1 tablet (100 mg total) by mouth daily. 30 tablet 11  .  Multiple Vitamins-Minerals (ICAPS) CAPS Take by mouth daily.      Marland Kitchen omeprazole (PRILOSEC) 20 MG capsule Take 1 capsule (20 mg total) by mouth daily. 90 capsule 3  . ONE TOUCH ULTRA TEST test strip as directed.    . Probiotic Product (ALIGN) 4 MG CAPS Take 1 capsule by mouth daily. 14 capsule 0  . sodium chloride (OCEAN) 0.65 % nasal spray Place 1 spray into the nose as needed.      Marland Kitchen spironolactone (ALDACTONE) 25 MG tablet Take 1 tablet (25 mg total) by mouth daily. 30 tablet 5  . sucralfate (CARAFATE) 1 GM/10ML suspension Take 10 mLs (1 g total) by mouth 2 (two) times daily. 420 mL 3  . traMADol (ULTRAM) 50 MG tablet Take 50 mg by mouth every 6 (six) hours as needed.     . zoledronic acid (RECLAST) 5 MG/100ML SOLN Inject 100 mLs (5 mg  total) into the vein once. Short Stay 100 mL 0   No current facility-administered medications for this visit.    Allergies as of 04/17/2014 - Review Complete 04/17/2014  Allergen Reaction Noted  . Actos [pioglitazone]  10/06/2013  . Codeine  01/08/2007  . Sulfonamide derivatives  01/08/2007    Family History  Problem Relation Age of Onset  . Heart disease Mother   . Prostate cancer Brother   . Breast cancer Other     niece - breast cancer  . Diabetes Sister   . Lung cancer Father   . Lung cancer Sister   . Colon cancer Neg Hx   . Esophageal cancer Neg Hx   . Stomach cancer Cousin   . Cholelithiasis Mother     History   Social History  . Marital Status: Married    Spouse Name: N/A    Number of Children: N/A  . Years of Education: 12   Occupational History  . retired   .     Social History Main Topics  . Smoking status: Never Smoker   . Smokeless tobacco: Never Used  . Alcohol Use: No  . Drug Use: No  . Sexual Activity: Not on file   Other Topics Concern  . Not on file   Social History Narrative   [name], Married. Lives with husband. Had been a very active woman.      Physical Exam: BP 140/50 mmHg  Pulse 72  Ht 5\' 4"  (1.626 m)  Wt 100 lb (45.36 kg)  BMI 17.16 kg/m2 Constitutional: generally well-appearing Psychiatric: alert and oriented x3 Abdomen: soft, mildly tender mid epigastrium, nondistended, no obvious ascites, no peritoneal signs, normal bowel sounds     Assessment and plan: 80 y.o. female with intermittent abdominal pains, chronic nausea, weight loss  She has lost 7 pounds since her visit to our office about 6 months ago. She has chronic nausea, intermittent abdominal discomforts. She does have gallstones or gallbladder perhaps these are causing her symptoms. I am more struck with her weight loss and her abdominal mild tenderness on exam. She has not had cross-sectional imaging and I will set up CT scan abdomen and pelvis. I do have moderate  concern today for occult neoplasm such as pancreatic. She is going to have a basic set of labs today as well including CBC and complete metabolic profile. If the workup above is negative then I will likely send her to a surgeon to discuss gallbladder resection.

## 2014-04-19 ENCOUNTER — Ambulatory Visit (INDEPENDENT_AMBULATORY_CARE_PROVIDER_SITE_OTHER)
Admission: RE | Admit: 2014-04-19 | Discharge: 2014-04-19 | Disposition: A | Discharge status: Home / Self Care | Payer: Medicare Other | Source: Ambulatory Visit | Attending: Gastroenterology | Admitting: Gastroenterology

## 2014-04-19 DIAGNOSIS — R634 Abnormal weight loss: Secondary | ICD-10-CM

## 2014-04-19 DIAGNOSIS — R109 Unspecified abdominal pain: Secondary | ICD-10-CM

## 2014-04-19 MED ORDER — IOHEXOL 300 MG/ML  SOLN
100.0000 mL | Freq: Once | INTRAMUSCULAR | Status: AC | PRN
  Administered 2014-04-19: 100 mL via INTRAVENOUS

## 2014-04-20 ENCOUNTER — Encounter: Payer: Self-pay | Admitting: Family Medicine

## 2014-04-20 ENCOUNTER — Other Ambulatory Visit: Payer: Self-pay

## 2014-04-20 ENCOUNTER — Ambulatory Visit (INDEPENDENT_AMBULATORY_CARE_PROVIDER_SITE_OTHER): Payer: Medicare Other | Admitting: Family Medicine

## 2014-04-20 VITALS — BP 140/68 | HR 69 | Temp 98.2°F | Wt 99.9 lb

## 2014-04-20 DIAGNOSIS — M25552 Pain in left hip: Secondary | ICD-10-CM

## 2014-04-20 DIAGNOSIS — M7062 Trochanteric bursitis, left hip: Secondary | ICD-10-CM

## 2014-04-20 DIAGNOSIS — R21 Rash and other nonspecific skin eruption: Secondary | ICD-10-CM

## 2014-04-20 DIAGNOSIS — M5410 Radiculopathy, site unspecified: Secondary | ICD-10-CM

## 2014-04-20 DIAGNOSIS — K8020 Calculus of gallbladder without cholecystitis without obstruction: Secondary | ICD-10-CM

## 2014-04-20 MED ORDER — METHYLPREDNISOLONE ACETATE 40 MG/ML IJ SUSP
40.0000 mg | Freq: Once | INTRAMUSCULAR | Status: AC
  Administered 2014-04-20: 40 mg via INTRA_ARTICULAR

## 2014-04-20 NOTE — Progress Notes (Signed)
Subjective:    Patient ID: [name], female    DOB: 09/03/1932, 80 y.o.   MRN: [id number]  HPI Patient seen with left hip and left lower extremity pain. Her location of pain is mostly lateral hip but she also has some radiation occasionally down to the lower leg. Denies any consistent back pain. Possibly some mild numbness leg and thigh times at and occasional weakness. No loss of bladder or bowel control. Pain is achy quality 8 out of 10 in severity. She's tried Tylenol and tramadol without much improvement. She also has some lateral tenderness in the left hip.  Also describing somewhat petechial type rash lower legs. No recent increased edema. No easy bleeding or bruising.  Slightly pruritic rash. No alleviating or aggravating factors.    Past Medical History  Diagnosis Date  . Renal calculus   . PVD (peripheral vascular disease)   . Anemia, pernicious   . Atrophic gastritis   . Hypothyroidism   . HLD (hyperlipidemia)   . HTN (hypertension)   . CAD (coronary artery disease)   . Carotid artery stenosis   . Median arcuate ligament syndrome   . Arthritis     bursitis   . Irritable bowel syndrome (IBS)   . Tubular adenoma of colon 08/2013    with focal high grade dysplasia  . B12 deficiency   . Gallbladder polyp    Past Surgical History  Procedure Laterality Date  . Coronary artery bypass graft    . Appendectomy    . Biopsy thyroid    . Thyroid lobectomy    . Abdominal hysterectomy    . Esophageal manometry N/A 08/02/2012    Procedure: ESOPHAGEAL MANOMETRY (EM);  Surgeon: Sable Feil, MD;  Location: [facility];  Service: Endoscopy;  Laterality: N/A;    reports that she has never smoked. She has never used smokeless tobacco. She reports that she does not drink alcohol or use illicit drugs. family history includes Breast cancer in her other; Cholelithiasis in her mother; Diabetes in her sister; Heart disease in her mother; Lung cancer in her father and sister;  Prostate cancer in her brother; Stomach cancer in her cousin. There is no history of Colon cancer or Esophageal cancer. Allergies  Allergen Reactions  . Actos [Pioglitazone]     Swelling  . Codeine   . Sulfonamide Derivatives       Review of Systems  Constitutional: Negative for fever, chills and appetite change.  Respiratory: Negative for cough and shortness of breath.   Cardiovascular: Negative for leg swelling.  Gastrointestinal: Negative for abdominal pain.  Musculoskeletal: Negative for back pain.  Skin: Positive for rash.  Neurological: Positive for weakness and numbness.  Hematological: Negative for adenopathy.       Objective:   Physical Exam  Constitutional: She appears well-developed and well-nourished.  Cardiovascular: Normal rate and regular rhythm.   Pulmonary/Chest: Effort normal and breath sounds normal. No respiratory distress. She has no wheezes. She has no rales.  Musculoskeletal:  Left hip reveals slightly restricted range of motion. She has tenderness over the greater trochanteric bursa region. Straight leg raise is negative. No pitting edema leg and no calf tenderness  Neurological:  She has mild weakness with dorsiflexion on the left. Trace ankle reflex bilaterally and 1+ knee bilaterally  Skin: Rash noted.  Lower legs- few scattered non-blanching petechiae.  She also has a few scattered slightly scaly slightly erythematous eczematous lesions.  These are nontender.  Assessment & Plan:  Left lower extremity pain. She has some definite tenderness over the greater trochanteric bursa but not convinced this is causing all of her pain. Question component of lumbar radiculitis.  We discussed risks and benefits of corticosteroid injection into her left greater trochanter bursa. Suspect she has some component of neuropathic pain as well. Consider low-dose gabapentin.  After discussing risks and benefits of corticosteroid injection, pt consented.  We  prepped skin over her left greater trochanteric bursa with betadine.  Using 25 gauge one and one half inch needle, injected 2 cc of plain xylocaine and 40 mg of depomedrol with no difficulty and pt tolerated well.  Skin rash.  She has a few petechiae only lower legs and some areas of eczematous rash.  Use OTC steroid cream and  Prescription steroid if not improving with that.

## 2014-04-20 NOTE — Progress Notes (Signed)
Pre visit review using our clinic review tool, if applicable. No additional management support is needed unless otherwise documented below in the visit note. 

## 2014-04-20 NOTE — Patient Instructions (Signed)
Hip Bursitis Bursitis is a swelling and soreness (inflammation) of a fluid-filled sac (bursa). This sac overlies and protects the joints.  CAUSES   Injury.  Overuse of the muscles surrounding the joint.  Arthritis.  Gout.  Infection.  Cold weather.  Inadequate warm-up and conditioning prior to activities. The cause may not be known.  SYMPTOMS   Mild to severe irritation.  Tenderness and swelling over the outside of the hip.  Pain with motion of the hip.  If the bursa becomes infected, a fever may be present. Redness, tenderness, and warmth will develop over the hip. Symptoms usually lessen in 3 to 4 weeks with treatment, but can come back. TREATMENT If conservative treatment does not work, your caregiver may advise draining the bursa and injecting cortisone into the area. This may speed up the healing process. This may also be used as an initial treatment of choice. HOME CARE INSTRUCTIONS   Apply ice to the affected area for 15-20 minutes every 3 to 4 hours while awake for the first 2 days. Put the ice in a plastic bag and place a towel between the bag of ice and your skin.  Rest the painful joint as much as possible, but continue to put the joint through a normal range of motion at least 4 times per day. When the pain lessens, begin normal, slow movements and usual activities to help prevent stiffness of the hip.  Only take over-the-counter or prescription medicines for pain, discomfort, or fever as directed by your caregiver.  Use crutches to limit weight bearing on the hip joint, if advised.  Elevate your painful hip to reduce swelling. Use pillows for propping and cushioning your legs and hips.  Gentle massage may provide comfort and decrease swelling. SEEK IMMEDIATE MEDICAL CARE IF:   Your pain increases even during treatment, or you are not improving.  You have a fever.  You have heat and inflammation over the involved bursa.  You have any other questions or  concerns. MAKE SURE YOU:   Understand these instructions.  Will watch your condition.  Will get help right away if you are not doing well or get worse. Document Released: 10/04/2001 Document Revised: 07/07/2011 Document Reviewed: 05/03/2008 ExitCare Patient Information 2015 [employer]. This information is not intended to replace advice given to you by your health care provider. Make sure you discuss any questions you have with your health care provider.  

## 2014-05-11 ENCOUNTER — Ambulatory Visit (INDEPENDENT_AMBULATORY_CARE_PROVIDER_SITE_OTHER): Payer: Self-pay | Admitting: General Surgery

## 2014-05-11 DIAGNOSIS — K8020 Calculus of gallbladder without cholecystitis without obstruction: Secondary | ICD-10-CM | POA: Diagnosis not present

## 2014-05-11 NOTE — H&P (Signed)
History of Present Illness [name]; 05/11/2014 11:03 AM) Patient words: new patient- eval GB.  The patient is a 81 year old female who presents for evaluation of gall stones. The patient is a very nice 81 year old female who is referred by Dr. Ardis Valdez for an evaluation of symptomatic cholelithiasis. Patient states that she has had some weight loss, and bouts of abdominal pain that is a 60 state with nausea and vomiting and diarrhea. Patient has had an extensive workup to include endoscopy, ultrasound and CT scans. Patient is unable to arrive at conclusion to her symptomatology. Ultrasound has revealed a 3 mm polyp as well as multiple stones.   Other Problems [name], [place]; 05/11/2014 10:43 AM) Arthritis Back Pain Cholelithiasis Gastroesophageal Reflux Disease Heart murmur High blood pressure Kidney Stone  Past Surgical History [name], [place]; 05/11/2014 10:43 AM) Cataract Surgery Bilateral. Coronary Artery Bypass Graft Hysterectomy (due to cancer) - Partial Oral Surgery Thyroid Surgery  Diagnostic Studies History [name], [place]; 05/11/2014 10:43 AM) Colonoscopy within last year Mammogram within last year Pap Smear >5 years ago  Allergies [name]; 05/11/2014 10:45 AM) Actos *ANTIDIABETICS* Codeine Phosphate *ANALGESICS - OPIOID* Sulfonamide Derivatives  Medication History [name]; 05/11/2014 10:49 AM) Carafate (1GM/10ML Suspension, Oral as directed) Active. AmLODIPine Besylate (5MG  Tablet, Oral daily) Active. Aspirin Childrens (81MG  Tablet Chewable, dai Oral) Active. Atorvastatin Calcium (80MG  Tablet, Oral daily) Active. Calcium 600 (1500MG  Tablet, Oral daily) Active. Vitamin D (1000UNIT Capsule, Oral daily) Active. Coreg (6.25MG  Tablet, Oral daily) Active. Flonase Allergy Relief (50MCG/ACT Suspension, Nasal as needed) Active. Lasix (40MG  Tablet, Oral daily) Active. HydrALAZINE HCl (25MG  Tablet,  Oral daily) Active. Synthroid (75MCG Tablet, Oral daily) Active. Losartan Potassium (100MG  Tablet, Oral daily) Active. Multivitamin Adults 50+ (Oral daily) Active. Probiotic + Omega-3 (Oral daily) Active. Spironolactone (25MG  Tablet, Oral daily) Active. TraMADol HCl (50MG  Tablet, Oral as needed) Active. Reclast (5MG /100ML Solution, Intravenous yearly) Active.  Social History [name], [place]; 05/11/2014 10:43 AM) Caffeine use Coffee. No alcohol use No drug use Tobacco use Never smoker.  Family History [name], [place]; 05/11/2014 10:43 AM) Arthritis Mother, Sister. Hypertension Brother, Sister. Prostate Cancer Brother. Respiratory Condition Father.  Pregnancy / Birth History [name], [place]; 05/11/2014 10:43 AM) Age at menarche 65 years. Gravida 0 Para 0  Review of Systems [name]; 05/11/2014 11:03 AM) General Present- Weight Loss. Not Present- Appetite Loss, Chills, Fatigue, Fever, Night Sweats and Weight Gain. Skin Present- Dryness. Not Present- Change in Wart/Mole, Hives, Jaundice, New Lesions, Non-Healing Wounds, Rash and Ulcer. HEENT Present- Seasonal Allergies and Wears glasses/contact lenses. Not Present- Earache, Hearing Loss, Hoarseness, Nose Bleed, Oral Ulcers, Ringing in the Ears, Sinus Pain, Sore Throat, Visual Disturbances and Yellow Eyes. Respiratory Not Present- Bloody sputum, Chronic Cough, Difficulty Breathing, Snoring and Wheezing. Breast Not Present- Breast Mass, Breast Pain, Nipple Discharge and Skin Changes. Cardiovascular Not Present- Chest Pain, Difficulty Breathing Lying Down, Leg Cramps, Palpitations, Rapid Heart Rate, Shortness of Breath and Swelling of Extremities. Gastrointestinal Present- Abdominal Pain, Indigestion and Nausea. Not Present- Bloating, Bloody Stool, Change in Bowel Habits, Chronic diarrhea, Constipation, Difficulty Swallowing, Excessive gas, Gets full quickly at meals, Hemorrhoids, Rectal Pain and  Vomiting. Female Genitourinary Present- Frequency and Urgency. Not Present- Nocturia, Painful Urination and Pelvic Pain. Musculoskeletal Present- Back Pain, Joint Pain and Muscle Pain. Not Present- Joint Stiffness, Muscle Weakness and Swelling of Extremities. Neurological Not Present- Decreased Memory, Fainting, Headaches, Numbness, Seizures, Tingling, Tremor, Trouble walking and Weakness. Psychiatric Not Present- Anxiety, Bipolar, Change  in Sleep Pattern, Depression, Fearful and Frequent crying. Endocrine Not Present- Cold Intolerance, Excessive Hunger, Hair Changes, Heat Intolerance, Hot flashes and New Diabetes. Hematology Not Present- Easy Bruising, Excessive bleeding, Gland problems, HIV and Persistent Infections.   Vitals [name] [place] Valdez; 05/11/2014 10:44 AM) 05/11/2014 10:44 AM Weight: 97.2 lb Height: 64in Body Surface Area: 1.41 m Body Mass Index: 16.68 kg/m Temp.: 98.89F(Oral)  Pulse: 64 (Regular)  Resp.: 12 (Unlabored)  BP: 142/78 (Sitting, Left Arm, Standard)    Physical Exam [name]; 05/11/2014 11:03 AM) General Mental Status-Alert. General Appearance-Consistent with stated age. Hydration-Well hydrated. Voice-Normal.  Head and Neck Head-normocephalic, atraumatic with no lesions or palpable masses.  Chest and Lung Exam Chest and lung exam reveals -quiet, even and easy respiratory effort with no use of accessory muscles and on auscultation, normal breath sounds, no adventitious sounds and normal vocal resonance. Inspection Chest Wall - Normal. Back - normal.  Cardiovascular Cardiovascular examination reveals -on palpation PMI is normal in location and amplitude, no palpable S3 or S4. Normal cardiac borders., normal heart sounds, regular rate and rhythm with no murmurs, carotid auscultation reveals no bruits and normal pedal pulses bilaterally.  Abdomen Inspection Normal Exam - No Hernias. Skin - Scar - no surgical  scars. Palpation/Percussion Normal exam - Soft, Non Tender, No Rebound tenderness, No Rigidity (guarding) and No hepatosplenomegaly. Auscultation Normal exam - Bowel sounds normal.  Neurologic Neurologic evaluation reveals -alert and oriented x 3 with no impairment of recent or remote memory. Mental Status-Normal.  Musculoskeletal Normal Exam - Left-Upper Extremity Strength Normal and Lower Extremity Strength Normal. Normal Exam - Right-Upper Extremity Strength Normal, Lower Extremity Weakness.    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 I did have long discussion with the patient in regards to the fact that secondary to the fact that her symptomatology is not necessarily classic this may not resolve her symptoms. I did offer her a HIDA scan to check the function of her gallbladder. The patient states that secondary to timing with the upcoming valve replacement of her husband she would like to proceed with surgery at this time.  1. Will proceed with laparoscopic cholecystectomy 2. Risks and benefits were discussed with the patient to generally include, but not limited to: infection, bleeding, possible need for post op ERCP, damage to the bile ducts, bile leak, and possible need for further surgery. Alternatives were offered and described. All questions were answered and the patient voiced understanding of the procedure and wishes to proceed at this point with a laparoscopic cholecystectomy

## 2014-05-15 ENCOUNTER — Other Ambulatory Visit: Payer: Self-pay

## 2014-05-15 MED ORDER — ATORVASTATIN CALCIUM 80 MG PO TABS: 80.0000 mg | ORAL_TABLET | Freq: Every day | ORAL | Status: DC

## 2014-05-15 NOTE — Telephone Encounter (Signed)
Rx refill for atorvastatin calcium 80 mg- Take 1 tablet by mouth daily #30  Pharm:  [facility]  Rx sent to pharmacy.

## 2014-05-17 DIAGNOSIS — E539 Vitamin B deficiency, unspecified: Secondary | ICD-10-CM | POA: Diagnosis not present

## 2014-05-18 ENCOUNTER — Encounter: Payer: Self-pay | Admitting: Family Medicine

## 2014-05-18 ENCOUNTER — Ambulatory Visit (INDEPENDENT_AMBULATORY_CARE_PROVIDER_SITE_OTHER): Payer: Medicare Other | Admitting: Family Medicine

## 2014-05-18 VITALS — BP 134/68 | HR 64 | Temp 97.4°F | Wt 99.0 lb

## 2014-05-18 DIAGNOSIS — I10 Essential (primary) hypertension: Secondary | ICD-10-CM | POA: Diagnosis not present

## 2014-05-18 NOTE — Progress Notes (Signed)
   Subjective:    Patient ID: [name], female    DOB: [birth year], 81 y.o.   MRN: [id number]  HPI Patient here for follow-up hypertension. She is on multiple medications. A few months ago we added spirinolactone and changed her from diltiazem to amlodipine. Her blood pressures have been much better. By home readings she is consistently getting less than 150/90. She's not had any recent dizziness. She's had some gallbladder issues with gallstones and feels this is impaired her appetite and she has had some recent mild weight loss. She plans to have surgery soon. Recent electrolytes in December were unremarkable except potassium 3.4. She is focusing on high potassium foods  Past Medical History  Diagnosis Date  . Renal calculus   . PVD (peripheral vascular disease)   . Anemia, pernicious   . Atrophic gastritis   . Hypothyroidism   . HLD (hyperlipidemia)   . HTN (hypertension)   . CAD (coronary artery disease)   . Carotid artery stenosis   . Median arcuate ligament syndrome   . Arthritis     bursitis   . Irritable bowel syndrome (IBS)   . Tubular adenoma of colon 08/2013    with focal high grade dysplasia  . B12 deficiency   . Gallbladder polyp    Past Surgical History  Procedure Laterality Date  . Coronary artery bypass graft    . Appendectomy    . Biopsy thyroid    . Thyroid lobectomy    . Abdominal hysterectomy    . Esophageal manometry N/A 08/02/2012    Procedure: ESOPHAGEAL MANOMETRY (EM);  Surgeon: Sable Feil, MD;  Location: [facility];  Service: Endoscopy;  Laterality: N/A;    reports that she has never smoked. She has never used smokeless tobacco. She reports that she does not drink alcohol or use illicit drugs. family history includes Breast cancer in her other; Cholelithiasis in her mother; Diabetes in her sister; Heart disease in her mother; Lung cancer in her father and sister; Prostate cancer in her brother; Stomach cancer in her cousin. There is no history  of Colon cancer or Esophageal cancer. Allergies  Allergen Reactions  . Actos [Pioglitazone]     Swelling  . Codeine   . Sulfonamide Derivatives       Review of Systems  Constitutional: Negative for fatigue.  Eyes: Negative for visual disturbance.  Respiratory: Negative for cough, chest tightness, shortness of breath and wheezing.   Cardiovascular: Negative for chest pain, palpitations and leg swelling.  Neurological: Negative for dizziness, seizures, syncope, weakness, light-headedness and headaches.       Objective:   Physical Exam  Constitutional: She appears well-developed and well-nourished.  Neck: Neck supple. No JVD present.  Cardiovascular: Normal rate and regular rhythm.   Pulmonary/Chest: Effort normal and breath sounds normal. No respiratory distress. She has no wheezes. She has no rales.  Musculoskeletal: She exhibits no edema.          Assessment & Plan:  Hypertension. Improved. Continue current medications. Information on DASH diet given. We'll plan routine follow-up in 6 months and sooner as needed

## 2014-05-18 NOTE — Patient Instructions (Signed)
DASH Eating Plan °DASH stands for "Dietary Approaches to Stop Hypertension." The DASH eating plan is a healthy eating plan that has been shown to reduce high blood pressure (hypertension). Additional health benefits may include reducing the risk of type 2 diabetes mellitus, heart disease, and stroke. The DASH eating plan may also help with weight loss. °WHAT DO I NEED TO KNOW ABOUT THE DASH EATING PLAN? °For the DASH eating plan, you will follow these general guidelines: °· Choose foods with a percent daily value for sodium of less than 5% (as listed on the food label). °· Use salt-free seasonings or herbs instead of table salt or sea salt. °· Check with your health care provider or pharmacist before using salt substitutes. °· Eat lower-sodium products, often labeled as "lower sodium" or "no salt added." °· Eat fresh foods. °· Eat more vegetables, fruits, and low-fat dairy products. °· Choose whole grains. Look for the word "whole" as the first word in the ingredient list. °· Choose fish and skinless chicken or turkey more often than red meat. Limit fish, poultry, and meat to 6 oz (170 g) each day. °· Limit sweets, desserts, sugars, and sugary drinks. °· Choose heart-healthy fats. °· Limit cheese to 1 oz (28 g) per day. °· Eat more home-cooked food and less restaurant, buffet, and fast food. °· Limit fried foods. °· Cook foods using methods other than frying. °· Limit canned vegetables. If you do use them, rinse them well to decrease the sodium. °· When eating at a restaurant, ask that your food be prepared with less salt, or no salt if possible. °WHAT FOODS CAN I EAT? °Seek help from a dietitian for individual calorie needs. °Grains °Whole grain or whole wheat bread. Brown rice. Whole grain or whole wheat pasta. Quinoa, bulgur, and whole grain cereals. Low-sodium cereals. Corn or whole wheat flour tortillas. Whole grain cornbread. Whole grain crackers. Low-sodium crackers. °Vegetables °Fresh or frozen vegetables  (raw, steamed, roasted, or grilled). Low-sodium or reduced-sodium tomato and vegetable juices. Low-sodium or reduced-sodium tomato sauce and paste. Low-sodium or reduced-sodium canned vegetables.  °Fruits °All fresh, canned (in natural juice), or frozen fruits. °Meat and Other Protein Products °Ground beef (85% or leaner), grass-fed beef, or beef trimmed of fat. Skinless chicken or turkey. Ground chicken or turkey. Pork trimmed of fat. All fish and seafood. Eggs. Dried beans, peas, or lentils. Unsalted nuts and seeds. Unsalted canned beans. °Dairy °Low-fat dairy products, such as skim or 1% milk, 2% or reduced-fat cheeses, low-fat ricotta or cottage cheese, or plain low-fat yogurt. Low-sodium or reduced-sodium cheeses. °Fats and Oils °Tub margarines without trans fats. Light or reduced-fat mayonnaise and salad dressings (reduced sodium). Avocado. Safflower, olive, or canola oils. Natural peanut or almond butter. °Other °Unsalted popcorn and pretzels. °The items listed above may not be a complete list of recommended foods or beverages. Contact your dietitian for more options. °WHAT FOODS ARE NOT RECOMMENDED? °Grains °White bread. White pasta. White rice. Refined cornbread. Bagels and croissants. Crackers that contain trans fat. °Vegetables °Creamed or fried vegetables. Vegetables in a cheese sauce. Regular canned vegetables. Regular canned tomato sauce and paste. Regular tomato and vegetable juices. °Fruits °Dried fruits. Canned fruit in light or heavy syrup. Fruit juice. °Meat and Other Protein Products °Fatty cuts of meat. Ribs, chicken wings, bacon, sausage, bologna, salami, chitterlings, fatback, hot dogs, bratwurst, and packaged luncheon meats. Salted nuts and seeds. Canned beans with salt. °Dairy °Whole or 2% milk, cream, half-and-half, and cream cheese. Whole-fat or sweetened yogurt. Full-fat   cheeses or blue cheese. Nondairy creamers and whipped toppings. Processed cheese, cheese spreads, or cheese  curds. °Condiments °Onion and garlic salt, seasoned salt, table salt, and sea salt. Canned and packaged gravies. Worcestershire sauce. Tartar sauce. Barbecue sauce. Teriyaki sauce. Soy sauce, including reduced sodium. Steak sauce. Fish sauce. Oyster sauce. Cocktail sauce. Horseradish. Ketchup and mustard. Meat flavorings and tenderizers. Bouillon cubes. Hot sauce. Tabasco sauce. Marinades. Taco seasonings. Relishes. °Fats and Oils °Butter, stick margarine, lard, shortening, ghee, and bacon fat. Coconut, palm kernel, or palm oils. Regular salad dressings. °Other °Pickles and olives. Salted popcorn and pretzels. °The items listed above may not be a complete list of foods and beverages to avoid. Contact your dietitian for more information. °WHERE CAN I FIND MORE INFORMATION? °National Heart, [employer], [employer] Institute: www.nhlbi.nih.gov/health/health-topics/topics/dash/ °Document Released: 04/03/2011 Document Revised: 08/29/2013 Document Reviewed: 02/16/2013 °ExitCare® Patient Information ©2015 [employer], [employer]. This information is not intended to replace advice given to you by your health care provider. Make sure you discuss any questions you have with your health care provider. ° °

## 2014-05-18 NOTE — Progress Notes (Signed)
Pre visit review using our clinic review tool, if applicable. No additional management support is needed unless otherwise documented below in the visit note. 

## 2014-05-25 DIAGNOSIS — L300 Nummular dermatitis: Secondary | ICD-10-CM | POA: Diagnosis not present

## 2014-06-02 ENCOUNTER — Telehealth: Payer: Self-pay | Admitting: Cardiovascular Disease

## 2014-06-02 NOTE — Telephone Encounter (Signed)
I spoke with the pt and she needs to have her gallbladder removed. The pt has been evaluated by [facility] and they will not schedule surgery until the pt is cleared by Cardiology. Pt scheduled to see Dr Burt Knack 06/14/14.

## 2014-06-02 NOTE — Telephone Encounter (Signed)
New Message      Patient needs surgical clearance for a procedure.  Please give patient a call.  Thanks

## 2014-06-14 ENCOUNTER — Encounter: Payer: Self-pay | Admitting: Cardiovascular Disease

## 2014-06-14 ENCOUNTER — Ambulatory Visit (INDEPENDENT_AMBULATORY_CARE_PROVIDER_SITE_OTHER): Payer: Medicare Other | Admitting: Cardiovascular Disease

## 2014-06-14 VITALS — BP 115/62 | HR 69 | Ht 64.0 in | Wt 97.8 lb

## 2014-06-14 DIAGNOSIS — I2510 Atherosclerotic heart disease of native coronary artery without angina pectoris: Secondary | ICD-10-CM | POA: Diagnosis not present

## 2014-06-14 DIAGNOSIS — I10 Essential (primary) hypertension: Secondary | ICD-10-CM | POA: Diagnosis not present

## 2014-06-14 DIAGNOSIS — I340 Nonrheumatic mitral (valve) insufficiency: Secondary | ICD-10-CM

## 2014-06-14 NOTE — Patient Instructions (Signed)
Your physician has requested that you have an echocardiogram. Echocardiography is a painless test that uses sound waves to create images of your heart. It provides your doctor with information about the size and shape of your heart and how well your heart's chambers and valves are working. This procedure takes approximately one hour. There are no restrictions for this procedure.  Your physician has recommended you make the following change in your medication: 1. STOP Hydralazine  Your physician wants you to follow-up in: 6 MONTHS with Dr Burt Knack.  You will receive a reminder letter in the mail two months in advance. If you don't receive a letter, please call our office to schedule the follow-up appointment.

## 2014-06-14 NOTE — Progress Notes (Addendum)
Cardiology Office Note   Date:  06/14/2014   ID:  [name], DOB 11/07/1932, MRN [id number]  PCP:  [name], MD  Cardiologist:  Sherren Mocha, MD    Chief Complaint  Patient presents with  . Surgical clearance  . Coronary Artery Disease     History of Present Illness: [name] is a 81 y.o. female who presents for preoperative cardiac assessment for upcoming cholecystectomy. She's developed decreased appetite with postprandial abdominal discomfort and diarrhea. She has been diagnosed with cholelithiasis.   She has a history of coronary artery disease status post CABG in 2007. She's also followed for asymptomatic carotid stenosis and moderate mitral regurgitation. She's had malignant hypertension and has had some difficulty tolerating antihypertensive medicines. She has a wide pulse pressure.  Since I saw her last in June 2015, there have been some changes in her antihypertensive regimen. Diltiazem was changed to amlodipine and aldactone was added.   From a symptomatic perspective, she complains of generalized fatigue. Sometimes she feels 'groggy.' She denies chest pain, shortness of breath, edema, orthopnea, or PND. Rare palpitations are unchanged over time. She does complain of episodes of dizziness sometimes with postural changes.  Past Medical History  Diagnosis Date  . Renal calculus   . PVD (peripheral vascular disease)   . Anemia, pernicious   . Atrophic gastritis   . Hypothyroidism   . HLD (hyperlipidemia)   . HTN (hypertension)   . CAD (coronary artery disease)   . Carotid artery stenosis   . Median arcuate ligament syndrome   . Arthritis     bursitis   . Irritable bowel syndrome (IBS)   . Tubular adenoma of colon 08/2013    with focal high grade dysplasia  . B12 deficiency   . Gallbladder polyp     Past Surgical History  Procedure Laterality Date  . Coronary artery bypass graft    . Appendectomy    . Biopsy thyroid    . Thyroid  lobectomy    . Abdominal hysterectomy    . Esophageal manometry N/A 08/02/2012    Procedure: ESOPHAGEAL MANOMETRY (EM);  Surgeon: Sable Feil, MD;  Location: [facility];  Service: Endoscopy;  Laterality: N/A;    Current Outpatient Prescriptions  Medication Sig Dispense Refill  . amLODipine (NORVASC) 5 MG tablet Take 1 tablet (5 mg total) by mouth daily. 30 tablet 6  . aspirin 81 MG tablet Take 81 mg by mouth daily.      Marland Kitchen atorvastatin (LIPITOR) 80 MG tablet Take 1 tablet (80 mg total) by mouth daily. 30 tablet 5  . Calcium Citrate-Vitamin D (CITRACAL + D PO) Take by mouth daily.      . carvedilol (COREG) 6.25 MG tablet Take 0.5 tablets (3.125 mg total) by mouth 2 (two) times daily with a meal. 60 tablet 5  . cholecalciferol (VITAMIN D) 1000 UNITS tablet Take 2,000 Units by mouth daily.     . cyanocobalamin (,VITAMIN B-12,) 1000 MCG/ML injection Inject 1,000 mcg into the muscle every 30 (thirty) days.    . fluticasone (FLONASE) 50 MCG/ACT nasal spray Place 2 sprays into the nose daily as needed. 16 g 11  . levothyroxine (SYNTHROID, LEVOTHROID) 75 MCG tablet Take 75 mcg by mouth daily.      Marland Kitchen losartan (COZAAR) 100 MG tablet Take 1 tablet (100 mg total) by mouth daily. 30 tablet 11  . Multiple Vitamins-Minerals (ICAPS) CAPS Take by mouth daily.      . Probiotic Product (ALIGN)  4 MG CAPS Take 1 capsule by mouth daily. 14 capsule 0  . sodium chloride (OCEAN) 0.65 % nasal spray Place 1 spray into the nose as needed.      Marland Kitchen spironolactone (ALDACTONE) 25 MG tablet Take 1 tablet (25 mg total) by mouth daily. 30 tablet 5  . sucralfate (CARAFATE) 1 GM/10ML suspension Take 10 mLs (1 g total) by mouth 2 (two) times daily. 420 mL 3  . traMADol (ULTRAM) 50 MG tablet Take 50 mg by mouth every 6 (six) hours as needed.      No current facility-administered medications for this visit.    Allergies:   Actos; Sulfonamide derivatives; and Codeine   Social History:  The patient  reports that she has  never smoked. She has never used smokeless tobacco. She reports that she does not drink alcohol or use illicit drugs.   Family History:  The patient's  family history includes Breast cancer in her other; Cholelithiasis in her mother; Diabetes in her sister; Heart disease in her mother; Lung cancer in her father and sister; Prostate cancer in her brother; Stomach cancer in her cousin. There is no history of Colon cancer or Esophageal cancer.    ROS:  Please see the history of present illness.  Otherwise, review of systems is positive for left hip pain, abdominal pain, diarrhea, nausea, decreased appetite, back pain, easy bruising.  All other systems are reviewed and negative.    PHYSICAL EXAM: VS:  BP 115/62 mmHg  Pulse 69  Ht 5\' 4"  (1.626 m)  Wt 97 lb 12.8 oz (44.362 kg)  BMI 16.78 kg/m2  SpO2 99% , BMI Body mass index is 16.78 kg/(m^2). GEN: Thin, very pleasant woman, in no acute distress HEENT: normal Neck: no JVD, no masses, no carotid bruits Cardiac: Regular rate and rhythm with grade 2/6 holosystolic murmur at the LV apex                Respiratory:  clear to auscultation bilaterally, normal work of breathing GI: soft, mild upper abdominal tenderness without rebound or guarding, nondistended, + BS MS: no deformity or atrophy Ext: no pretibial edema Skin: warm and dry, no rash Neuro:  Strength and sensation are intact Psych: euthymic mood, full affect  EKG:  EKG is ordered today. The ekg ordered today shows sinus bradycardia with left bundle branch block pattern, no change from previous.  Recent Labs: 04/17/2014: ALT 37*; BUN 17; Creatinine 0.8; Hemoglobin 13.0; Platelets 202.0; Potassium 3.4*; Sodium 143   Lipid Panel     Component Value Date/Time   CHOL 155 10/09/2011 1111   TRIG 46.0 10/09/2011 1111   TRIG 85 02/05/2006 0818   HDL 88.50 10/09/2011 1111   CHOLHDL 2 10/09/2011 1111   CHOLHDL 3.6 CALC 02/05/2006 0818   VLDL 9.2 10/09/2011 1111   LDLCALC 57 10/09/2011  1111      Wt Readings from Last 3 Encounters:  06/14/14 97 lb 12.8 oz (44.362 kg)  05/18/14 99 lb (44.906 kg)  04/20/14 99 lb 14.4 oz (45.314 kg)     ASSESSMENT AND PLAN: 1.  Coronary artery disease, native vessel. The patient has no symptoms of angina. She can achieve greater than 4 metabolic equivalents without any cardiopulmonary symptoms. Will continue her current medical program with aspirin, a beta blocker, and a statin drug. His long as her echocardiogram is stable, she can proceed with gallbladder surgery without further cardiac evaluation and I would anticipate that she would be at low risk of cardiac complications.  2. Carotid stenosis no history of stroke or TIA. Continue medical therapy with aspirin and a statin drug. Last doppler reviewed from 6.25.2015 showed stable 40-59% bilateral ICA stenosis. Yearly follow-up recommended.  3. Mitral regurgitation, moderate. Last assessment by echo September 2014 as above.   I would like for her to have a repeat echocardiogram to evaluate degree of mitral regurgitation and exclude any changes in LV function or dimensions.  4. Hypertension. Med changes as outlined in the HPI with better control of BP since addition of aldactone and change to amlodipine. However, the patient has progressive fatigue and some episodes of lightheadedness. Her blood pressure is relatively low at her office visit today. She also reports some low readings at home. I recommended that she hold hydralazine for the time being. She will continue to monitor blood pressure. She will call if readings increase to greater than 140/90.   Current medicines are reviewed with the patient today.  The patient does not have concerns regarding medicines.  The following changes have been made:  Hold hydralazine  Labs/ tests ordered today include:   Orders Placed This Encounter  Procedures  . EKG 12-Lead  . 2D Echocardiogram without contrast    Disposition:   FU 6  months  Signed, Sherren Mocha, MD  06/14/2014 9:24 AM    [facility] [place], [place], [place]  [address] Phone: [phone]; Fax: [phone]   ADDENDUM: Echo stable - see result below: 2D Echo: Study Conclusions  - Left ventricle: The cavity size was normal. Systolic function was normal. The estimated ejection fraction was in the range of 55% to 60%. Wall motion was normal; there were no regional wall motion abnormalities. Features are consistent with a pseudonormal left ventricular filling pattern, with concomitant abnormal relaxation and increased filling pressure (grade 2 diastolic dysfunction). - Ventricular septum: Septal motion showed moderate paradox. These changes are consistent with intraventricular conduction delay. - Aortic valve: Moderate thickening and calcification, consistent with sclerosis. There was moderate regurgitation. - Mitral valve: Mildly thickened leaflets . There was mild regurgitation. - Atrial septum: There was increased thickness of the septum, consistent with lipomatous hypertrophy. - Tricuspid valve: There was mild regurgitation.  As above, can proceed with cholecystectomy at low risk of cardiac complications.  Sherren Mocha 07/02/2014 8:42 PM

## 2014-06-15 ENCOUNTER — Encounter: Payer: Self-pay | Admitting: Cardiovascular Disease

## 2014-06-19 ENCOUNTER — Other Ambulatory Visit (HOSPITAL_COMMUNITY): Payer: Medicare Other

## 2014-06-22 ENCOUNTER — Ambulatory Visit (HOSPITAL_COMMUNITY): Payer: Medicare Other | Attending: Cardiology | Admitting: Radiology

## 2014-06-22 DIAGNOSIS — I340 Nonrheumatic mitral (valve) insufficiency: Secondary | ICD-10-CM

## 2014-06-22 NOTE — Progress Notes (Signed)
Echocardiogram performed.  

## 2014-06-26 ENCOUNTER — Telehealth: Payer: Self-pay | Admitting: Cardiovascular Disease

## 2014-06-26 ENCOUNTER — Encounter: Payer: Self-pay | Admitting: Cardiovascular Disease

## 2014-06-26 NOTE — Telephone Encounter (Signed)
This encounter was created in error - please disregard.

## 2014-06-26 NOTE — Telephone Encounter (Signed)
New msg       Pt returning call from earlier.    Please call back.

## 2014-06-26 NOTE — Telephone Encounter (Signed)
error 

## 2014-06-29 DIAGNOSIS — H04123 Dry eye syndrome of bilateral lacrimal glands: Secondary | ICD-10-CM | POA: Diagnosis not present

## 2014-06-29 DIAGNOSIS — Z961 Presence of intraocular lens: Secondary | ICD-10-CM | POA: Diagnosis not present

## 2014-06-29 DIAGNOSIS — H16103 Unspecified superficial keratitis, bilateral: Secondary | ICD-10-CM | POA: Diagnosis not present

## 2014-06-29 DIAGNOSIS — H3531 Nonexudative age-related macular degeneration: Secondary | ICD-10-CM | POA: Diagnosis not present

## 2014-07-06 DIAGNOSIS — D519 Vitamin B12 deficiency anemia, unspecified: Secondary | ICD-10-CM | POA: Diagnosis not present

## 2014-07-07 ENCOUNTER — Other Ambulatory Visit: Payer: Self-pay | Admitting: *Deleted

## 2014-07-07 MED ORDER — LOSARTAN POTASSIUM 100 MG PO TABS: 100.0000 mg | ORAL_TABLET | Freq: Every day | ORAL | Status: DC

## 2014-07-18 DIAGNOSIS — M9903 Segmental and somatic dysfunction of lumbar region: Secondary | ICD-10-CM | POA: Diagnosis not present

## 2014-07-18 DIAGNOSIS — M9905 Segmental and somatic dysfunction of pelvic region: Secondary | ICD-10-CM | POA: Diagnosis not present

## 2014-07-18 DIAGNOSIS — M5413 Radiculopathy, cervicothoracic region: Secondary | ICD-10-CM | POA: Diagnosis not present

## 2014-07-18 DIAGNOSIS — M9902 Segmental and somatic dysfunction of thoracic region: Secondary | ICD-10-CM | POA: Diagnosis not present

## 2014-07-18 DIAGNOSIS — M5416 Radiculopathy, lumbar region: Secondary | ICD-10-CM | POA: Diagnosis not present

## 2014-07-19 ENCOUNTER — Other Ambulatory Visit: Payer: Self-pay | Admitting: Family Medicine

## 2014-07-19 MED ORDER — SPIRONOLACTONE 25 MG PO TABS: 25.0000 mg | ORAL_TABLET | Freq: Every day | ORAL | Status: DC

## 2014-09-12 ENCOUNTER — Other Ambulatory Visit: Payer: Self-pay | Admitting: *Deleted

## 2014-09-12 MED ORDER — AMLODIPINE BESYLATE 5 MG PO TABS: 5.0000 mg | ORAL_TABLET | Freq: Every day | ORAL | Status: DC

## 2014-09-21 DIAGNOSIS — D513 Other dietary vitamin B12 deficiency anemia: Secondary | ICD-10-CM | POA: Diagnosis not present

## 2014-09-26 DIAGNOSIS — H43813 Vitreous degeneration, bilateral: Secondary | ICD-10-CM | POA: Diagnosis not present

## 2014-09-26 DIAGNOSIS — H3531 Nonexudative age-related macular degeneration: Secondary | ICD-10-CM | POA: Diagnosis not present

## 2014-09-28 DIAGNOSIS — M150 Primary generalized (osteo)arthritis: Secondary | ICD-10-CM | POA: Diagnosis not present

## 2014-09-28 DIAGNOSIS — M797 Fibromyalgia: Secondary | ICD-10-CM | POA: Diagnosis not present

## 2014-10-04 ENCOUNTER — Ambulatory Visit: Payer: Self-pay | Admitting: General Surgery

## 2014-10-04 DIAGNOSIS — K8020 Calculus of gallbladder without cholecystitis without obstruction: Secondary | ICD-10-CM | POA: Diagnosis not present

## 2014-10-04 NOTE — H&P (Signed)
istory of Present Illness Ralene Ok MD; 10/04/2014 2:41 PM) The patient is a 81 year old female who presents for evaluation of gall stones. The patient is a very nice 81 year old female who is referred by Dr. Ardis Hughs for an evaluation of symptomatic cholelithiasis. Patient states that she has had some weight loss, and bouts of abdominal pain that is a 60 state with nausea and vomiting and diarrhea. Patient has had an extensive workup to include endoscopy, ultrasound and CT scans. Patient is unable to arrive at conclusion to her symptomatology. Ultrasound has revealed a 3 mm polyp as well as multiple stones. Patient had put off her surgery secondary to her husband having a valve replacement. Patient has continued with similar symptoms.   Allergies Elbert Ewings, CMA; 10/04/2014 2:11 PM) Actos *ANTIDIABETICS* Codeine Phosphate *ANALGESICS - OPIOID* Sulfonamide Derivatives  Medication History Elbert Ewings, CMA; 10/04/2014 2:12 PM) Carafate (1GM/10ML Suspension, Oral as directed) Active. AmLODIPine Besylate (5MG  Tablet, Oral daily) Active. Aspirin Childrens (81MG  Tablet Chewable, dai Oral) Active. Atorvastatin Calcium (80MG  Tablet, Oral daily) Active. Calcium 600 (1500MG  Tablet, Oral daily) Active. Vitamin D (1000UNIT Capsule, Oral daily) Active. Coreg (6.25MG  Tablet, Oral daily) Active. Flonase Allergy Relief (50MCG/ACT Suspension, Nasal as needed) Active. Lasix (40MG  Tablet, Oral daily) Active. HydrALAZINE HCl (25MG  Tablet, Oral daily) Active. Synthroid (75MCG Tablet, Oral daily) Active. Losartan Potassium (100MG  Tablet, Oral daily) Active. Multivitamin Adults 50+ (Oral daily) Active. Probiotic + Omega-3 (Oral daily) Active. Spironolactone (25MG  Tablet, Oral daily) Active. TraMADol HCl (50MG  Tablet, Oral as needed) Active. Reclast (5MG /100ML Solution, Intravenous yearly) Active. Medications Reconciled  Review of Systems Ralene Ok MD; 10/04/2014 2:41 PM) General  Present- Weight Loss. Not Present- Appetite Loss, Chills, Fatigue, Fever, Night Sweats and Weight Gain. Skin Present- Dryness. Not Present- Change in Wart/Mole, Hives, Jaundice, New Lesions, Non-Healing Wounds, Rash and Ulcer. HEENT Present- Seasonal Allergies and Wears glasses/contact lenses. Not Present- Earache, Hearing Loss, Hoarseness, Nose Bleed, Oral Ulcers, Ringing in the Ears, Sinus Pain, Sore Throat, Visual Disturbances and Yellow Eyes. Respiratory Not Present- Bloody sputum, Chronic Cough, Difficulty Breathing, Snoring and Wheezing. Breast Not Present- Breast Mass, Breast Pain, Nipple Discharge and Skin Changes. Cardiovascular Not Present- Chest Pain, Difficulty Breathing Lying Down, Leg Cramps, Palpitations, Rapid Heart Rate, Shortness of Breath and Swelling of Extremities. Gastrointestinal Present- Abdominal Pain, Indigestion and Nausea. Not Present- Bloating, Bloody Stool, Change in Bowel Habits, Chronic diarrhea, Constipation, Difficulty Swallowing, Excessive gas, Gets full quickly at meals, Hemorrhoids, Rectal Pain and Vomiting. Female Genitourinary Present- Frequency and Urgency. Not Present- Nocturia, Painful Urination and Pelvic Pain. Musculoskeletal Present- Back Pain, Joint Pain and Muscle Pain. Not Present- Joint Stiffness, Muscle Weakness and Swelling of Extremities. Neurological Not Present- Decreased Memory, Fainting, Headaches, Numbness, Seizures, Tingling, Tremor, Trouble walking and Weakness. Psychiatric Not Present- Anxiety, Bipolar, Change in Sleep Pattern, Depression, Fearful and Frequent crying. Endocrine Not Present- Cold Intolerance, Excessive Hunger, Hair Changes, Heat Intolerance, Hot flashes and New Diabetes. Hematology Not Present- Easy Bruising, Excessive bleeding, Gland problems, HIV and Persistent Infections.   Vitals Elbert Ewings CMA; 10/04/2014 2:12 PM) 10/04/2014 2:12 PM Weight: 99.4 lb Height: 64in Body Surface Area: 1.43 m Body Mass Index: 17.06  kg/m Temp.: 98.72F(Oral)  Pulse: 76 (Regular)  Resp.: 17 (Unlabored)  BP: 116/90 (Sitting, Left Arm, Standard)    Physical Exam Ralene Ok MD; 10/04/2014 2:41 PM) General Mental Status-Alert. General Appearance-Consistent with stated age. Hydration-Well hydrated. Voice-Normal.  Head and Neck Head-normocephalic, atraumatic with no lesions or palpable masses.  Chest and Lung Exam Chest  and lung exam reveals -quiet, even and easy respiratory effort with no use of accessory muscles and on auscultation, normal breath sounds, no adventitious sounds and normal vocal resonance. Inspection Chest Wall - Normal. Back - normal.  Cardiovascular Cardiovascular examination reveals -on palpation PMI is normal in location and amplitude, no palpable S3 or S4. Normal cardiac borders., normal heart sounds, regular rate and rhythm with no murmurs, carotid auscultation reveals no bruits and normal pedal pulses bilaterally.  Abdomen Inspection Normal Exam - No Hernias. Skin - Scar - no surgical scars. Palpation/Percussion Normal exam - Soft, Non Tender, No Rebound tenderness, No Rigidity (guarding) and No hepatosplenomegaly. Auscultation Normal exam - Bowel sounds normal.  Neurologic Neurologic evaluation reveals -alert and oriented x 3 with no impairment of recent or remote memory. Mental Status-Normal.  Musculoskeletal Normal Exam - Left-Upper Extremity Strength Normal and Lower Extremity Strength Normal. Normal Exam - Right-Upper Extremity Strength Normal, Lower Extremity Weakness.    Assessment & Plan Ralene Ok MD; 10/04/2014 2:43 PM) SYMPTOMATIC CHOLELITHIASIS (574.20  K80.20) Impression: 81 year old female with likely symptomatic cholelithiasis. Patient continued with similar type of abdominal pain. I rediscussed the possibility of obtaining a HIDA scan. The patient will like to proceed with surgery at this time and avoid HIDA scan. I did discuss  with her there is a possibility that this may not relieve her entire abdominal symptomatology. The patient is aware of this would like to proceed with surgery.  1. Will proceed with laparoscopic cholecystectomy 2. Risks and benefits were discussed with the patient to generally include, but not limited to: infection, bleeding, possible need for post op ERCP, damage to the bile ducts, bile leak, and possible need for further surgery. Alternatives were offered and described. All questions were answered and the patient voiced understanding of the procedure and wishes to proceed at this point with a laparoscopic cholecystectomy

## 2014-10-09 DIAGNOSIS — R7309 Other abnormal glucose: Secondary | ICD-10-CM | POA: Diagnosis not present

## 2014-10-09 DIAGNOSIS — Z8639 Personal history of other endocrine, nutritional and metabolic disease: Secondary | ICD-10-CM | POA: Diagnosis not present

## 2014-10-09 DIAGNOSIS — M810 Age-related osteoporosis without current pathological fracture: Secondary | ICD-10-CM | POA: Diagnosis not present

## 2014-10-11 DIAGNOSIS — M810 Age-related osteoporosis without current pathological fracture: Secondary | ICD-10-CM | POA: Diagnosis not present

## 2014-10-11 DIAGNOSIS — Z8639 Personal history of other endocrine, nutritional and metabolic disease: Secondary | ICD-10-CM | POA: Diagnosis not present

## 2014-10-11 DIAGNOSIS — R636 Underweight: Secondary | ICD-10-CM | POA: Diagnosis not present

## 2014-10-11 DIAGNOSIS — R7309 Other abnormal glucose: Secondary | ICD-10-CM | POA: Diagnosis not present

## 2014-10-16 ENCOUNTER — Encounter (HOSPITAL_COMMUNITY)
Admission: RE | Admit: 2014-10-16 | Discharge: 2014-10-16 | Disposition: A | Discharge status: Home / Self Care | Payer: Medicare Other | Source: Ambulatory Visit | Attending: General Surgery | Admitting: General Surgery

## 2014-10-16 ENCOUNTER — Encounter (HOSPITAL_COMMUNITY): Payer: Self-pay

## 2014-10-16 DIAGNOSIS — Z01812 Encounter for preprocedural laboratory examination: Secondary | ICD-10-CM | POA: Insufficient documentation

## 2014-10-16 DIAGNOSIS — K8080 Other cholelithiasis without obstruction: Secondary | ICD-10-CM | POA: Insufficient documentation

## 2014-10-16 HISTORY — DX: Gastro-esophageal reflux disease without esophagitis: K21.9

## 2014-10-16 HISTORY — DX: Family history of other specified conditions: Z84.89

## 2014-10-16 HISTORY — DX: Personal history of other diseases of the digestive system: Z87.19

## 2014-10-16 HISTORY — DX: Fibromyalgia: M79.7

## 2014-10-16 HISTORY — DX: Personal history of urinary calculi: Z87.442

## 2014-10-16 HISTORY — DX: Cardiac murmur, unspecified: R01.1

## 2014-10-16 LAB — BASIC METABOLIC PANEL
Anion gap: 6 (ref 5–15)
BUN: 14 mg/dL (ref 6–20)
CO2: 28 mmol/L (ref 22–32)
Calcium: 9 mg/dL (ref 8.9–10.3)
Chloride: 110 mmol/L (ref 101–111)
Creatinine, Ser: 0.86 mg/dL (ref 0.44–1.00)
GFR calc Af Amer: >60 mL/min (ref >60)
GFR calc non Af Amer: >60 mL/min (ref >60)
Glucose, Bld: 103 mg/dL — ABNORMAL HIGH (ref 65–99)
Potassium: 3.8 mmol/L (ref 3.5–5.1)
Sodium: 144 mmol/L (ref 135–145)

## 2014-10-16 LAB — CBC
HCT: 39.9 % (ref 36.0–46.0)
Hemoglobin: 12.9 g/dL (ref 12.0–15.0)
MCH: 27.7 pg (ref 26.0–34.0)
MCHC: 32.3 g/dL (ref 30.0–36.0)
MCV: 85.8 fL (ref 78.0–100.0)
Platelets: 157 10*3/uL (ref 150–400)
RBC: 4.65 MIL/uL (ref 3.87–5.11)
RDW: 13.7 % (ref 11.5–15.5)
WBC: 4.6 10*3/uL (ref 4.0–10.5)

## 2014-10-16 NOTE — Pre-Procedure Instructions (Addendum)
[name]  10/16/2014      [facility] - [place], [place] [address] Phone: [phone] Fax: [phone]  [id number] - [place], [place] [address] [facility] [place] [address] Phone: [phone] Fax: [phone]    Your procedure is scheduled on 10/24/14  Report to [facility] short stay admitting at 800 A.M.  Call this number if you have problems the morning of surgery:  [phone]   Remember:  Do not eat food or drink liquids after midnight.  Take these medicines the morning of surgery with A SIP OF WATER tramadol, tylenol if needed,amlodipine, carvedilol(coreg),sucralfate    STOP all herbel meds, nsaids (aleve,naproxen,advil,ibuprofen) 5 days prior to surgery starting 10/19/14 including all vitamins ,calcium,aspirin,    Do not wear jewelry, make-up or nail polish.  Do not wear lotions, powders, or perfumes.  You may wear deodorant.  Do not shave 48 hours prior to surgery.  Men may shave face and neck.  Do not bring valuables to the hospital.  [facility] is not responsible for any belongings or valuables.  Contacts, dentures or bridgework may not be worn into surgery.  Leave your suitcase in the car.  After surgery it may be brought to your room.  For patients admitted to the hospital, discharge time will be determined by your treatment team.  Patients discharged the day of surgery will not be allowed to drive home.   Name and phone number of your driver:    Special instructions:   Special Instructions: [facility] - Preparing for Surgery  Before surgery, you can play an important role.  Because skin is not sterile, your skin needs to be as free of germs as possible.  You can reduce the number of germs on you skin by washing with CHG (chlorahexidine gluconate) soap before surgery.  CHG is an antiseptic cleaner which kills germs and bonds with the skin to continue killing germs even  after washing.  Please DO NOT use if you have an allergy to CHG or antibacterial soaps.  If your skin becomes reddened/irritated stop using the CHG and inform your nurse when you arrive at Short Stay.  Do not shave (including legs and underarms) for at least 48 hours prior to the first CHG shower.  You may shave your face.  Please follow these instructions carefully:   1.  Shower with CHG Soap the night before surgery and the morning of Surgery.  2.  If you choose to wash your hair, wash your hair first as usual with your normal shampoo.  3.  After you shampoo, rinse your hair and body thoroughly to remove the Shampoo.  4.  Use CHG as you would any other liquid soap.  You can apply chg directly  to the skin and wash gently with scrungie or a clean washcloth.  5.  Apply the CHG Soap to your body ONLY FROM THE NECK DOWN.  Do not use on open wounds or open sores.  Avoid contact with your eyes ears, mouth and genitals (private parts).  Wash genitals (private parts)       with your normal soap.  6.  Wash thoroughly, paying special attention to the area where your surgery will be performed.  7.  Thoroughly rinse your body with warm water from the neck down.  8.  DO NOT shower/wash with your normal soap after using and rinsing off the CHG Soap.  9.  Pat yourself dry with a clean towel.            10.  Wear clean pajamas.            11.  Place clean sheets on your bed the night of your first shower and do not sleep with pets.  Day of Surgery  Do not apply any lotions/deodorants the morning of surgery.  Please wear clean clothes to the hospital/surgery center.  Please read over the following fact sheets that you were given. Pain Booklet, Coughing and Deep Breathing and Surgical Site Infection Prevention

## 2014-10-23 MED ORDER — CEFAZOLIN SODIUM-DEXTROSE 2-3 GM-% IV SOLR
2.0000 g | INTRAVENOUS | Status: AC
  Administered 2014-10-24: 2 g via INTRAVENOUS
  Filled 2014-10-23: qty 50

## 2014-10-24 ENCOUNTER — Encounter (HOSPITAL_COMMUNITY): Payer: Self-pay | Admitting: *Deleted

## 2014-10-24 ENCOUNTER — Ambulatory Visit (HOSPITAL_COMMUNITY)
Admission: RE | Admit: 2014-10-24 | Discharge: 2014-10-24 | Disposition: A | Discharge status: Home / Self Care | Payer: Medicare Other | Source: Ambulatory Visit | Attending: General Surgery | Admitting: General Surgery

## 2014-10-24 ENCOUNTER — Encounter (HOSPITAL_COMMUNITY)
Admission: RE | Disposition: A | Discharge status: Home / Self Care | Payer: Self-pay | Source: Ambulatory Visit | Attending: General Surgery

## 2014-10-24 ENCOUNTER — Ambulatory Visit (HOSPITAL_COMMUNITY): Payer: Medicare Other | Admitting: Anesthesiology

## 2014-10-24 DIAGNOSIS — I080 Rheumatic disorders of both mitral and aortic valves: Secondary | ICD-10-CM | POA: Insufficient documentation

## 2014-10-24 DIAGNOSIS — D649 Anemia, unspecified: Secondary | ICD-10-CM | POA: Diagnosis not present

## 2014-10-24 DIAGNOSIS — M1990 Unspecified osteoarthritis, unspecified site: Secondary | ICD-10-CM | POA: Insufficient documentation

## 2014-10-24 DIAGNOSIS — I2510 Atherosclerotic heart disease of native coronary artery without angina pectoris: Secondary | ICD-10-CM | POA: Diagnosis not present

## 2014-10-24 DIAGNOSIS — M797 Fibromyalgia: Secondary | ICD-10-CM | POA: Insufficient documentation

## 2014-10-24 DIAGNOSIS — Z951 Presence of aortocoronary bypass graft: Secondary | ICD-10-CM | POA: Diagnosis not present

## 2014-10-24 DIAGNOSIS — K589 Irritable bowel syndrome without diarrhea: Secondary | ICD-10-CM | POA: Diagnosis not present

## 2014-10-24 DIAGNOSIS — I10 Essential (primary) hypertension: Secondary | ICD-10-CM | POA: Diagnosis not present

## 2014-10-24 DIAGNOSIS — I739 Peripheral vascular disease, unspecified: Secondary | ICD-10-CM | POA: Diagnosis not present

## 2014-10-24 DIAGNOSIS — E039 Hypothyroidism, unspecified: Secondary | ICD-10-CM | POA: Diagnosis not present

## 2014-10-24 DIAGNOSIS — K8010 Calculus of gallbladder with chronic cholecystitis without obstruction: Secondary | ICD-10-CM | POA: Insufficient documentation

## 2014-10-24 DIAGNOSIS — K8020 Calculus of gallbladder without cholecystitis without obstruction: Secondary | ICD-10-CM | POA: Diagnosis not present

## 2014-10-24 HISTORY — PX: CHOLECYSTECTOMY: SHX55

## 2014-10-24 SURGERY — LAPAROSCOPIC CHOLECYSTECTOMY
Anesthesia: General | Site: Abdomen

## 2014-10-24 MED ORDER — ROCURONIUM BROMIDE 50 MG/5ML IV SOLN
INTRAVENOUS | Status: AC
  Filled 2014-10-24: qty 1

## 2014-10-24 MED ORDER — ONDANSETRON HCL 4 MG/2ML IJ SOLN
INTRAMUSCULAR | Status: DC | PRN
  Administered 2014-10-24: 4 mg via INTRAVENOUS

## 2014-10-24 MED ORDER — SODIUM CHLORIDE 0.9 % IJ SOLN: 3.0000 mL | Freq: Two times a day (BID) | INTRAMUSCULAR | Status: DC

## 2014-10-24 MED ORDER — HYDRALAZINE HCL 20 MG/ML IJ SOLN
INTRAMUSCULAR | Status: AC
  Filled 2014-10-24: qty 1

## 2014-10-24 MED ORDER — SODIUM CHLORIDE 0.9 % IJ SOLN: 3.0000 mL | INTRAMUSCULAR | Status: DC | PRN

## 2014-10-24 MED ORDER — ROCURONIUM BROMIDE 100 MG/10ML IV SOLN
INTRAVENOUS | Status: DC | PRN
  Administered 2014-10-24: 30 mg via INTRAVENOUS

## 2014-10-24 MED ORDER — ACETAMINOPHEN 325 MG PO TABS: 650.0000 mg | ORAL_TABLET | ORAL | Status: DC | PRN

## 2014-10-24 MED ORDER — ARTIFICIAL TEARS OP OINT
TOPICAL_OINTMENT | OPHTHALMIC | Status: AC
  Filled 2014-10-24: qty 3.5

## 2014-10-24 MED ORDER — HYDROMORPHONE HCL 1 MG/ML IJ SOLN
0.2500 mg | INTRAMUSCULAR | Status: DC | PRN
  Administered 2014-10-24: 0.5 mg via INTRAVENOUS
  Administered 2014-10-24 (×2): 0.25 mg via INTRAVENOUS

## 2014-10-24 MED ORDER — PROPOFOL 10 MG/ML IV BOLUS
INTRAVENOUS | Status: DC | PRN
  Administered 2014-10-24: 70 mg via INTRAVENOUS

## 2014-10-24 MED ORDER — FENTANYL CITRATE (PF) 250 MCG/5ML IJ SOLN
INTRAMUSCULAR | Status: AC
  Filled 2014-10-24: qty 5

## 2014-10-24 MED ORDER — DEXAMETHASONE SODIUM PHOSPHATE 4 MG/ML IJ SOLN
INTRAMUSCULAR | Status: DC | PRN
  Administered 2014-10-24: 4 mg via INTRAVENOUS

## 2014-10-24 MED ORDER — OXYCODONE HCL 5 MG PO TABS: 5.0000 mg | ORAL_TABLET | ORAL | Status: DC | PRN

## 2014-10-24 MED ORDER — HYDROMORPHONE HCL 1 MG/ML IJ SOLN
INTRAMUSCULAR | Status: AC
  Administered 2014-10-24: 0.25 mg via INTRAVENOUS
  Filled 2014-10-24: qty 1

## 2014-10-24 MED ORDER — OXYCODONE-ACETAMINOPHEN 5-325 MG PO TABS: 1.0000 | ORAL_TABLET | ORAL | Status: DC | PRN

## 2014-10-24 MED ORDER — LIDOCAINE HCL (CARDIAC) 20 MG/ML IV SOLN
INTRAVENOUS | Status: DC | PRN
  Administered 2014-10-24: 60 mg via INTRATRACHEAL
  Administered 2014-10-24: 60 mg via INTRAVENOUS

## 2014-10-24 MED ORDER — SUCCINYLCHOLINE CHLORIDE 20 MG/ML IJ SOLN
INTRAMUSCULAR | Status: AC
  Filled 2014-10-24: qty 1

## 2014-10-24 MED ORDER — SUGAMMADEX SODIUM 200 MG/2ML IV SOLN
INTRAVENOUS | Status: AC
  Filled 2014-10-24: qty 2

## 2014-10-24 MED ORDER — 0.9 % SODIUM CHLORIDE (POUR BTL) OPTIME
TOPICAL | Status: DC | PRN
  Administered 2014-10-24: 1000 mL

## 2014-10-24 MED ORDER — PROPOFOL 10 MG/ML IV BOLUS
INTRAVENOUS | Status: AC
  Filled 2014-10-24: qty 20

## 2014-10-24 MED ORDER — SUGAMMADEX SODIUM 200 MG/2ML IV SOLN
INTRAVENOUS | Status: DC | PRN
  Administered 2014-10-24: 100 mg via INTRAVENOUS

## 2014-10-24 MED ORDER — LIDOCAINE HCL (CARDIAC) 20 MG/ML IV SOLN
INTRAVENOUS | Status: AC
  Filled 2014-10-24: qty 15

## 2014-10-24 MED ORDER — CHLORHEXIDINE GLUCONATE 4 % EX LIQD
1.0000 | Freq: Once | CUTANEOUS | Status: DC
Start: 2014-10-25 — End: 2014-10-24

## 2014-10-24 MED ORDER — LACTATED RINGERS IV SOLN
INTRAVENOUS | Status: DC
  Administered 2014-10-24 (×2): via INTRAVENOUS

## 2014-10-24 MED ORDER — DEXAMETHASONE SODIUM PHOSPHATE 4 MG/ML IJ SOLN
INTRAMUSCULAR | Status: AC
Start: 2014-10-24 — End: 2014-10-24
  Filled 2014-10-24: qty 1

## 2014-10-24 MED ORDER — ONDANSETRON HCL 4 MG/2ML IJ SOLN
INTRAMUSCULAR | Status: AC
  Filled 2014-10-24: qty 2

## 2014-10-24 MED ORDER — BUPIVACAINE HCL (PF) 0.25 % IJ SOLN
INTRAMUSCULAR | Status: AC
  Filled 2014-10-24: qty 30

## 2014-10-24 MED ORDER — FENTANYL CITRATE (PF) 100 MCG/2ML IJ SOLN
INTRAMUSCULAR | Status: DC | PRN
  Administered 2014-10-24 (×3): 50 ug via INTRAVENOUS

## 2014-10-24 MED ORDER — ACETAMINOPHEN 650 MG RE SUPP: 650.0000 mg | RECTAL | Status: DC | PRN

## 2014-10-24 MED ORDER — BUPIVACAINE HCL 0.25 % IJ SOLN
INTRAMUSCULAR | Status: DC | PRN
  Administered 2014-10-24: 5 mL

## 2014-10-24 MED ORDER — OXYCODONE HCL 5 MG PO TABS
ORAL_TABLET | ORAL | Status: AC
  Filled 2014-10-24: qty 2

## 2014-10-24 MED ORDER — SODIUM CHLORIDE 0.9 % IV SOLN: 250.0000 mL | INTRAVENOUS | Status: DC | PRN

## 2014-10-24 SURGICAL SUPPLY — 49 items
2/3MM BLADELESS TROCAR ×6 IMPLANT
APL SKNCLS STERI-STRIP NONHPOA (GAUZE/BANDAGES/DRESSINGS) ×1
BAG SPEC RTRVL LRG 6X4 10 (ENDOMECHANICALS)
BENZOIN TINCTURE PRP APPL 2/3 (GAUZE/BANDAGES/DRESSINGS) ×3 IMPLANT
CANISTER SUCTION 2500CC (MISCELLANEOUS) ×3 IMPLANT
CHLORAPREP W/TINT 26ML (MISCELLANEOUS) ×3 IMPLANT
CLIP LIGATING HEMO O LOK GREEN (MISCELLANEOUS) ×5 IMPLANT
CLOSURE WOUND 1/2 X4 (GAUZE/BANDAGES/DRESSINGS) ×1
COVER SURGICAL LIGHT HANDLE (MISCELLANEOUS) ×3 IMPLANT
COVER TRANSDUCER ULTRASND (DRAPES) ×3 IMPLANT
DEVICE TROCAR PUNCTURE CLOSURE (ENDOMECHANICALS) ×3 IMPLANT
ELECT REM PT RETURN 9FT ADLT (ELECTROSURGICAL) ×3
ELECTRODE REM PT RTRN 9FT ADLT (ELECTROSURGICAL) ×1 IMPLANT
GAUZE SPONGE 2X2 8PLY STRL LF (GAUZE/BANDAGES/DRESSINGS) ×1 IMPLANT
GLOVE BIO SURGEON STRL SZ 6.5 (GLOVE) ×1 IMPLANT
GLOVE BIO SURGEON STRL SZ7 (GLOVE) ×2 IMPLANT
GLOVE BIO SURGEON STRL SZ7.5 (GLOVE) ×3 IMPLANT
GLOVE BIO SURGEONS STRL SZ 6.5 (GLOVE) ×1
GLOVE BIOGEL PI IND STRL 7.0 (GLOVE) IMPLANT
GLOVE BIOGEL PI IND STRL 7.5 (GLOVE) IMPLANT
GLOVE BIOGEL PI IND STRL 8 (GLOVE) IMPLANT
GLOVE BIOGEL PI INDICATOR 7.0 (GLOVE) ×6
GLOVE BIOGEL PI INDICATOR 7.5 (GLOVE) ×2
GLOVE BIOGEL PI INDICATOR 8 (GLOVE) ×2
GOWN STRL REUS W/ TWL LRG LVL3 (GOWN DISPOSABLE) ×2 IMPLANT
GOWN STRL REUS W/ TWL XL LVL3 (GOWN DISPOSABLE) ×1 IMPLANT
GOWN STRL REUS W/TWL LRG LVL3 (GOWN DISPOSABLE) ×9
GOWN STRL REUS W/TWL XL LVL3 (GOWN DISPOSABLE) ×6
KIT BASIN OR (CUSTOM PROCEDURE TRAY) ×3 IMPLANT
KIT ROOM TURNOVER OR (KITS) ×3 IMPLANT
NDL INSUFFLATION 14GA 120MM (NEEDLE) ×1 IMPLANT
NEEDLE INSUFFLATION 14GA 120MM (NEEDLE) ×3 IMPLANT
NS IRRIG 1000ML POUR BTL (IV SOLUTION) ×3 IMPLANT
PAD ARMBOARD 7.5X6 YLW CONV (MISCELLANEOUS) ×6 IMPLANT
POUCH SPECIMEN RETRIEVAL 10MM (ENDOMECHANICALS) IMPLANT
SCISSORS LAP 5X35 DISP (ENDOMECHANICALS) ×3 IMPLANT
SET IRRIG TUBING LAPAROSCOPIC (IRRIGATION / IRRIGATOR) ×1 IMPLANT
SLEEVE ENDOPATH XCEL 5M (ENDOMECHANICALS) ×1 IMPLANT
SPECIMEN JAR SMALL (MISCELLANEOUS) ×3 IMPLANT
SPONGE GAUZE 2X2 STER 10/PKG (GAUZE/BANDAGES/DRESSINGS) ×2
STRIP CLOSURE SKIN 1/2X4 (GAUZE/BANDAGES/DRESSINGS) ×1 IMPLANT
SUT MNCRL AB 3-0 PS2 18 (SUTURE) ×3 IMPLANT
TAPE CLOTH SOFT 2X10 (GAUZE/BANDAGES/DRESSINGS) ×2 IMPLANT
TOWEL OR 17X24 6PK STRL BLUE (TOWEL DISPOSABLE) ×3 IMPLANT
TOWEL OR 17X26 10 PK STRL BLUE (TOWEL DISPOSABLE) ×1 IMPLANT
TRAY LAPAROSCOPIC (CUSTOM PROCEDURE TRAY) ×3 IMPLANT
TROCAR XCEL NON-BLD 11X100MML (ENDOMECHANICALS) ×1 IMPLANT
TROCAR XCEL NON-BLD 5MMX100MML (ENDOMECHANICALS) ×3 IMPLANT
TUBING INSUFFLATION (TUBING) ×3 IMPLANT

## 2014-10-24 NOTE — Op Note (Signed)
10/24/2014  10:55 AM  PATIENT:  [name]  81 y.o. female  PRE-OPERATIVE DIAGNOSIS:  GALLSTONES  POST-OPERATIVE DIAGNOSIS:  GALLSTONES  PROCEDURE:  Procedure(s): Microscopic CHOLECYSTECTOMY (N/A)  SURGEON:  Surgeon(s) and Role:    * Ralene Ok, MD - Primary   ASSISTANTS: Jacqulyn Ducking, RN   ANESTHESIA:   local and general  EBL:  Total I/O In: 1000 [I.V.:1000] Out: -   BLOOD ADMINISTERED:none  DRAINS: none   LOCAL MEDICATIONS USED:  BUPIVICAINE   SPECIMEN:  Source of Specimen:  gallbladder  DISPOSITION OF SPECIMEN:  PATHOLOGY  COUNTS:  YES  TOURNIQUET:  * No tourniquets in log *  DICTATION: .Dragon Dictation The patient was taken to the operating and placed in the supine position with bilateral SCDs in place. The patient was prepped and draped in the usual sterile fashion. A time out was called and all facts were verified. A pneumoperitoneum was obtained via A Veress needle technique to a pressure of 69mm of mercury.  A 27mm trochar was then placed in the right upper quadrant under visualization, and there were no injuries to any abdominal organs. A 5 mm port was then placed in the umbilical region after infiltrating with local anesthesia under direct visualization. A second and third epigastric 81mm port were placed in the epigastrum and right lower quadrant port placement under direct visualization, respectively.The gallbladder was identified and retracted, the peritoneum was then sharply dissected from the gallbladder and this dissection was carried down to Calot's triangle. The gallbladder was identified and stripped away circumferentially and seen going into the gallbladder 360, the critical angle was obtained.  2 clips were placed proximally one distally and the cystic duct transected. The cystic artery was identified and 2 clips placed proximally and one distally and transected. We then proceeded to remove the gallbladder off the hepatic fossa with Bovie  cautery. A retrieval bag was then placed in the abdomen and gallbladder placed in the bag. The hepatic fossa was then reexamined and hemostasis was achieved with Bovie cautery and was excellent at the end of the case. The subhepatic fossa and perihepatic fossa was then irrigated until the effluent was clear. The 15mm mm trocar fascia was reapproximated with the Endo Close #1 Vicryl x1. The pneumoperitoneum was evacuated and all trochars removed under direct visulalization. The skin was then closed with 4-0 Monocryl and the skin dressed with Steri-Strips, gauze, and tape. The patient was awaken from general anesthesia and taken to the recovery room in stable condition.   PLAN OF CARE: Discharge to home after PACU  PATIENT DISPOSITION:  PACU - hemodynamically stable.   Delay start of Pharmacological VTE agent (>24hrs) due to surgical blood loss or risk of bleeding: not applicable

## 2014-10-24 NOTE — Anesthesia Postprocedure Evaluation (Signed)
  Anesthesia Post-op Note  Patient: [name]  Procedure(s) Performed: Procedure(s): MICROSCOPIC  CHOLECYSTECTOMY (N/A)  Patient Location: PACU  Anesthesia Type:General  Level of Consciousness: awake and alert   Airway and Oxygen Therapy: Patient Spontanous Breathing  Post-op Pain: mild  Post-op Assessment: Post-op Vital signs reviewed, Patient's Cardiovascular Status Stable and Respiratory Function Stable  Post-op Vital Signs: Reviewed  Filed Vitals:   10/24/14 1140  BP:   Pulse: 58  Temp:   Resp: 14    Complications: No apparent anesthesia complications

## 2014-10-24 NOTE — Discharge Instructions (Signed)
[facility] ______[facility] °LAPAROSCOPIC SURGERY: POST OP INSTRUCTIONS °Always review your discharge instruction sheet given to you by the facility where your surgery was performed. °IF YOU HAVE DISABILITY OR FAMILY LEAVE FORMS, YOU MUST BRING THEM TO THE OFFICE FOR PROCESSING.   °DO NOT GIVE THEM TO YOUR DOCTOR. ° °1. A prescription for pain medication may be given to you upon discharge.  Take your pain medication as prescribed, if needed.  If narcotic pain medicine is not needed, then you may take acetaminophen (Tylenol) or ibuprofen (Advil) as needed. °2. Take your usually prescribed medications unless otherwise directed. °3. If you need a refill on your pain medication, please contact your pharmacy.  They will contact our office to request authorization. Prescriptions will not be filled after 5pm or on week-ends. °4. You should follow a light diet the first few days after arrival home, such as soup and crackers, etc.  Be sure to include lots of fluids daily. °5. Most patients will experience some swelling and bruising in the area of the incisions.  Ice packs will help.  Swelling and bruising can take several days to resolve.  °6. It is common to experience some constipation if taking pain medication after surgery.  Increasing fluid intake and taking a stool softener (such as Colace) will usually help or prevent this problem from occurring.  A mild laxative (Milk of Magnesia or Miralax) should be taken according to package instructions if there are no bowel movements after 48 hours. °7. Unless discharge instructions indicate otherwise, you may remove your bandages 24-48 hours after surgery, and you may shower at that time.  You may have steri-strips (small skin tapes) in place directly over the incision.  These strips should be left on the skin for 7-10 days.  If your surgeon used skin glue on the incision, you may shower in 24 hours.  The glue will flake off over the next 2-3 weeks.  Any sutures or  staples will be removed at the office during your follow-up visit. °8. ACTIVITIES:  You may resume regular (light) daily activities beginning the next day--such as daily self-care, walking, climbing stairs--gradually increasing activities as tolerated.  You may have sexual intercourse when it is comfortable.  Refrain from any heavy lifting or straining until approved by your doctor. °a. You may drive when you are no longer taking prescription pain medication, you can comfortably wear a seatbelt, and you can safely maneuver your car and apply brakes. °b. RETURN TO WORK:  __________________________________________________________ °9. You should see your doctor in the office for a follow-up appointment approximately 2-3 weeks after your surgery.  Make sure that you call for this appointment within a day or two after you arrive home to insure a convenient appointment time. °10. OTHER INSTRUCTIONS: __________________________________________________________________________________________________________________________ __________________________________________________________________________________________________________________________ °WHEN TO CALL YOUR DOCTOR: °1. Fever over 101.0 °2. Inability to urinate °3. Continued bleeding from incision. °4. Increased pain, redness, or drainage from the incision. °5. Increasing abdominal pain ° °The clinic staff is available to answer your questions during regular business hours.  Please don’t hesitate to call and ask to speak to one of the nurses for clinical concerns.  If you have a medical emergency, go to the nearest emergency room or call 911.  A surgeon from [facility] is always on call at the hospital. °[address], [address], [place], [place]  [address] ? [address] [address], [place], [place]   [address] °[phone] ? [phone] ? FAX [phone] °Web site:   [email or web address] °

## 2014-10-24 NOTE — Anesthesia Preprocedure Evaluation (Addendum)
Anesthesia Evaluation  Patient identified by MRN, date of birth, ID band Patient awake    Reviewed: Allergy & Precautions, H&P , NPO status , Patient's Chart, lab work & pertinent test results, reviewed documented beta blocker date and time   Airway Mallampati: II  TM Distance: >3 FB Neck ROM: Full    Dental no notable dental hx. (+) Partial Upper, Partial Lower, Dental Advisory Given   Pulmonary neg pulmonary ROS,  breath sounds clear to auscultation  Pulmonary exam normal       Cardiovascular hypertension, Pt. on medications and Pt. on home beta blockers + CAD, + CABG and + Peripheral Vascular Disease + dysrhythmias + Valvular Problems/Murmurs AI and MR Rhythm:Regular Rate:Normal     Neuro/Psych negative neurological ROS  negative psych ROS   GI/Hepatic Neg liver ROS, GERD-  Medicated,  Endo/Other  Hypothyroidism   Renal/GU negative Renal ROS  negative genitourinary   Musculoskeletal  (+) Arthritis -, Osteoarthritis,  Fibromyalgia -  Abdominal   Peds  Hematology negative hematology ROS (+) anemia ,   Anesthesia Other Findings   Reproductive/Obstetrics negative OB ROS                            Anesthesia Physical Anesthesia Plan  ASA: III  Anesthesia Plan: General   Post-op Pain Management:    Induction: Intravenous  Airway Management Planned: Oral ETT  Additional Equipment:   Intra-op Plan:   Post-operative Plan: Extubation in OR  Informed Consent: I have reviewed the patients History and Physical, chart, labs and discussed the procedure including the risks, benefits and alternatives for the proposed anesthesia with the patient or authorized representative who has indicated his/her understanding and acceptance.   Dental advisory given  Plan Discussed with: CRNA  Anesthesia Plan Comments:         Anesthesia Quick Evaluation

## 2014-10-24 NOTE — H&P (View-Only) (Signed)
istory of Present Illness Ralene Ok MD; 10/04/2014 2:41 PM) The patient is a 81 year old female who presents for evaluation of gall stones. The patient is a very nice 81 year old female who is referred by Dr. Ardis Hughs for an evaluation of symptomatic cholelithiasis. Patient states that she has had some weight loss, and bouts of abdominal pain that is a 60 state with nausea and vomiting and diarrhea. Patient has had an extensive workup to include endoscopy, ultrasound and CT scans. Patient is unable to arrive at conclusion to her symptomatology. Ultrasound has revealed a 3 mm polyp as well as multiple stones. Patient had put off her surgery secondary to her husband having a valve replacement. Patient has continued with similar symptoms.   Allergies Elbert Ewings, CMA; 10/04/2014 2:11 PM) Actos *ANTIDIABETICS* Codeine Phosphate *ANALGESICS - OPIOID* Sulfonamide Derivatives  Medication History Elbert Ewings, CMA; 10/04/2014 2:12 PM) Carafate (1GM/10ML Suspension, Oral as directed) Active. AmLODIPine Besylate (5MG  Tablet, Oral daily) Active. Aspirin Childrens (81MG  Tablet Chewable, dai Oral) Active. Atorvastatin Calcium (80MG  Tablet, Oral daily) Active. Calcium 600 (1500MG  Tablet, Oral daily) Active. Vitamin D (1000UNIT Capsule, Oral daily) Active. Coreg (6.25MG  Tablet, Oral daily) Active. Flonase Allergy Relief (50MCG/ACT Suspension, Nasal as needed) Active. Lasix (40MG  Tablet, Oral daily) Active. HydrALAZINE HCl (25MG  Tablet, Oral daily) Active. Synthroid (75MCG Tablet, Oral daily) Active. Losartan Potassium (100MG  Tablet, Oral daily) Active. Multivitamin Adults 50+ (Oral daily) Active. Probiotic + Omega-3 (Oral daily) Active. Spironolactone (25MG  Tablet, Oral daily) Active. TraMADol HCl (50MG  Tablet, Oral as needed) Active. Reclast (5MG /100ML Solution, Intravenous yearly) Active. Medications Reconciled  Review of Systems Ralene Ok MD; 10/04/2014 2:41 PM) General  Present- Weight Loss. Not Present- Appetite Loss, Chills, Fatigue, Fever, Night Sweats and Weight Gain. Skin Present- Dryness. Not Present- Change in Wart/Mole, Hives, Jaundice, New Lesions, Non-Healing Wounds, Rash and Ulcer. HEENT Present- Seasonal Allergies and Wears glasses/contact lenses. Not Present- Earache, Hearing Loss, Hoarseness, Nose Bleed, Oral Ulcers, Ringing in the Ears, Sinus Pain, Sore Throat, Visual Disturbances and Yellow Eyes. Respiratory Not Present- Bloody sputum, Chronic Cough, Difficulty Breathing, Snoring and Wheezing. Breast Not Present- Breast Mass, Breast Pain, Nipple Discharge and Skin Changes. Cardiovascular Not Present- Chest Pain, Difficulty Breathing Lying Down, Leg Cramps, Palpitations, Rapid Heart Rate, Shortness of Breath and Swelling of Extremities. Gastrointestinal Present- Abdominal Pain, Indigestion and Nausea. Not Present- Bloating, Bloody Stool, Change in Bowel Habits, Chronic diarrhea, Constipation, Difficulty Swallowing, Excessive gas, Gets full quickly at meals, Hemorrhoids, Rectal Pain and Vomiting. Female Genitourinary Present- Frequency and Urgency. Not Present- Nocturia, Painful Urination and Pelvic Pain. Musculoskeletal Present- Back Pain, Joint Pain and Muscle Pain. Not Present- Joint Stiffness, Muscle Weakness and Swelling of Extremities. Neurological Not Present- Decreased Memory, Fainting, Headaches, Numbness, Seizures, Tingling, Tremor, Trouble walking and Weakness. Psychiatric Not Present- Anxiety, Bipolar, Change in Sleep Pattern, Depression, Fearful and Frequent crying. Endocrine Not Present- Cold Intolerance, Excessive Hunger, Hair Changes, Heat Intolerance, Hot flashes and New Diabetes. Hematology Not Present- Easy Bruising, Excessive bleeding, Gland problems, HIV and Persistent Infections.   Vitals Elbert Ewings CMA; 10/04/2014 2:12 PM) 10/04/2014 2:12 PM Weight: 99.4 lb Height: 64in Body Surface Area: 1.43 m Body Mass Index: 17.06  kg/m Temp.: 98.37F(Oral)  Pulse: 76 (Regular)  Resp.: 17 (Unlabored)  BP: 116/90 (Sitting, Left Arm, Standard)    Physical Exam Ralene Ok MD; 10/04/2014 2:41 PM) General Mental Status-Alert. General Appearance-Consistent with stated age. Hydration-Well hydrated. Voice-Normal.  Head and Neck Head-normocephalic, atraumatic with no lesions or palpable masses.  Chest and Lung Exam Chest  and lung exam reveals -quiet, even and easy respiratory effort with no use of accessory muscles and on auscultation, normal breath sounds, no adventitious sounds and normal vocal resonance. Inspection Chest Wall - Normal. Back - normal.  Cardiovascular Cardiovascular examination reveals -on palpation PMI is normal in location and amplitude, no palpable S3 or S4. Normal cardiac borders., normal heart sounds, regular rate and rhythm with no murmurs, carotid auscultation reveals no bruits and normal pedal pulses bilaterally.  Abdomen Inspection Normal Exam - No Hernias. Skin - Scar - no surgical scars. Palpation/Percussion Normal exam - Soft, Non Tender, No Rebound tenderness, No Rigidity (guarding) and No hepatosplenomegaly. Auscultation Normal exam - Bowel sounds normal.  Neurologic Neurologic evaluation reveals -alert and oriented x 3 with no impairment of recent or remote memory. Mental Status-Normal.  Musculoskeletal Normal Exam - Left-Upper Extremity Strength Normal and Lower Extremity Strength Normal. Normal Exam - Right-Upper Extremity Strength Normal, Lower Extremity Weakness.    Assessment & Plan Ralene Ok MD; 10/04/2014 2:43 PM) SYMPTOMATIC CHOLELITHIASIS (574.20  K80.20) Impression: 81 year old female with likely symptomatic cholelithiasis. Patient continued with similar type of abdominal pain. I rediscussed the possibility of obtaining a HIDA scan. The patient will like to proceed with surgery at this time and avoid HIDA scan. I did discuss  with her there is a possibility that this may not relieve her entire abdominal symptomatology. The patient is aware of this would like to proceed with surgery.  1. Will proceed with laparoscopic cholecystectomy 2. Risks and benefits were discussed with the patient to generally include, but not limited to: infection, bleeding, possible need for post op ERCP, damage to the bile ducts, bile leak, and possible need for further surgery. Alternatives were offered and described. All questions were answered and the patient voiced understanding of the procedure and wishes to proceed at this point with a laparoscopic cholecystectomy

## 2014-10-24 NOTE — Interval H&P Note (Signed)
History and Physical Interval Note:  10/24/2014 7:40 AM  [name]  has presented today for surgery, with the diagnosis of GALLSTONES  The various methods of treatment have been discussed with the patient and family. After consideration of risks, benefits and other options for treatment, the patient has consented to  Procedure(s): MICROSCOPIC  CHOLECYSTECTOMY (N/A) as a surgical intervention .  The patient's history has been reviewed, patient examined, no change in status, stable for surgery.  I have reviewed the patient's chart and labs.  Questions were answered to the patient's satisfaction.     Rosario Jacks., Anne Hahn

## 2014-10-24 NOTE — Transfer of Care (Signed)
Immediate Anesthesia Transfer of Care Note  Patient: [name]  Procedure(s) Performed: Procedure(s): MICROSCOPIC  CHOLECYSTECTOMY (N/A)  Patient Location: PACU  Anesthesia Type:General  Level of Consciousness: awake, alert  and oriented  Airway & Oxygen Therapy: Patient Spontanous Breathing and Patient connected to nasal cannula oxygen  Post-op Assessment: Report given to RN and Post -op Vital signs reviewed and stable  Post vital signs: Reviewed and stable  Last Vitals:  Filed Vitals:   10/24/14 0839  BP: 174/49  Pulse: 57  Temp: 49.6 C    Complications: No apparent anesthesia complications

## 2014-10-24 NOTE — Anesthesia Procedure Notes (Signed)
Procedure Name: Intubation Date/Time: 10/24/2014 10:02 AM Performed by: Maryland Pink Pre-anesthesia Checklist: Patient identified, Emergency Drugs available, Suction available, Patient being monitored and Timeout performed Patient Re-evaluated:Patient Re-evaluated prior to inductionOxygen Delivery Method: Circle system utilized Preoxygenation: Pre-oxygenation with 100% oxygen Intubation Type: IV induction Ventilation: Mask ventilation without difficulty and Oral airway inserted - appropriate to patient size Laryngoscope Size: Mac and 3 Grade View: Grade I Tube type: Oral Tube size: 7.0 mm Number of attempts: 1 Airway Equipment and Method: Stylet and LTA kit utilized Placement Confirmation: ETT inserted through vocal cords under direct vision,  positive ETCO2 and breath sounds checked- equal and bilateral Secured at: 19 cm Tube secured with: Tape Dental Injury: Teeth and Oropharynx as per pre-operative assessment

## 2014-10-25 ENCOUNTER — Encounter (HOSPITAL_COMMUNITY): Payer: Self-pay | Admitting: General Surgery

## 2014-10-31 ENCOUNTER — Other Ambulatory Visit (HOSPITAL_COMMUNITY): Payer: Self-pay | Admitting: Cardiovascular Disease

## 2014-10-31 DIAGNOSIS — I6523 Occlusion and stenosis of bilateral carotid arteries: Secondary | ICD-10-CM

## 2014-11-03 DIAGNOSIS — D519 Vitamin B12 deficiency anemia, unspecified: Secondary | ICD-10-CM | POA: Diagnosis not present

## 2014-11-06 ENCOUNTER — Encounter (HOSPITAL_COMMUNITY): Payer: Medicare Other

## 2014-11-07 ENCOUNTER — Other Ambulatory Visit: Payer: Self-pay | Admitting: *Deleted

## 2014-11-07 MED ORDER — ATORVASTATIN CALCIUM 80 MG PO TABS: 80.0000 mg | ORAL_TABLET | Freq: Every day | ORAL | Status: DC

## 2014-11-07 NOTE — Telephone Encounter (Signed)
Rx done. 

## 2014-11-09 ENCOUNTER — Ambulatory Visit (HOSPITAL_COMMUNITY): Payer: Medicare Other | Attending: Internal Medicine

## 2014-11-09 DIAGNOSIS — I6523 Occlusion and stenosis of bilateral carotid arteries: Secondary | ICD-10-CM | POA: Diagnosis not present

## 2014-11-16 ENCOUNTER — Ambulatory Visit (INDEPENDENT_AMBULATORY_CARE_PROVIDER_SITE_OTHER): Payer: Medicare Other | Admitting: Family Medicine

## 2014-11-16 ENCOUNTER — Encounter: Payer: Self-pay | Admitting: Family Medicine

## 2014-11-16 VITALS — BP 140/70 | HR 60 | Temp 98.0°F | Wt 100.0 lb

## 2014-11-16 DIAGNOSIS — I10 Essential (primary) hypertension: Secondary | ICD-10-CM | POA: Diagnosis not present

## 2014-11-16 DIAGNOSIS — E785 Hyperlipidemia, unspecified: Secondary | ICD-10-CM

## 2014-11-16 DIAGNOSIS — I2510 Atherosclerotic heart disease of native coronary artery without angina pectoris: Secondary | ICD-10-CM | POA: Diagnosis not present

## 2014-11-16 NOTE — Patient Instructions (Signed)
Check to see if Dr Buddy Duty checked lipid panel recently If not, go ahead and schedule fasting lipid panel.

## 2014-11-16 NOTE — Progress Notes (Signed)
Pre visit review using our clinic review tool, if applicable. No additional management support is needed unless otherwise documented below in the visit note. 

## 2014-11-16 NOTE — Progress Notes (Signed)
Subjective:    Patient ID: [name], female    DOB: 1933/03/13, 81 y.o.   MRN: [id number]  HPI Patient seen for routine six-month follow-up. She had gallbladder removed 3 weeks ago and that went without complication. She is regaining her appetite and has gained 1 pound from last visit. She has chronic problems of hyperlipidemia, hypertension, CAD, peripheral vascular disease, osteoporosis history, hypothyroidism. Hypothyroidism followed by endocrinology. She had recent carotid Dopplers with no progression of disease.  Blood pressures been challenging to control. Currently takes regimen of amlodipine, carvedilol, Aldactone, and losartan. No recent dizziness. No chest pains. No peripheral edema issues.  Past Medical History  Diagnosis Date  . PVD (peripheral vascular disease)   . Anemia, pernicious   . Atrophic gastritis   . Hypothyroidism   . HLD (hyperlipidemia)   . HTN (hypertension)   . CAD (coronary artery disease)   . Carotid artery stenosis   . Median arcuate ligament syndrome   . Arthritis     bursitis   . Irritable bowel syndrome (IBS)   . Tubular adenoma of colon 08/2013    with focal high grade dysplasia  . B12 deficiency   . Gallbladder polyp   . Family history of adverse reaction to anesthesia     extreme nausea brother and sister  . Heart murmur   . History of kidney stones   . GERD (gastroesophageal reflux disease)   . History of hiatal hernia   . Fibromyalgia    Past Surgical History  Procedure Laterality Date  . Appendectomy    . Biopsy thyroid    . Thyroid lobectomy    . Abdominal hysterectomy    . Esophageal manometry N/A 08/02/2012    Procedure: ESOPHAGEAL MANOMETRY (EM);  Surgeon: Sable Feil, MD;  Location: [facility];  Service: Endoscopy;  Laterality: N/A;  . Coronary artery bypass graft  07  . Cholecystectomy N/A 10/24/2014    Procedure: MICROSCOPIC  CHOLECYSTECTOMY;  Surgeon: Ralene Ok, MD;  Location: [place];  Service: General;   Laterality: N/A;    reports that she has never smoked. She has never used smokeless tobacco. She reports that she does not drink alcohol or use illicit drugs. family history includes Breast cancer in her other; Cholelithiasis in her mother; Diabetes in her sister; Heart disease in her mother; Lung cancer in her father and sister; Prostate cancer in her brother; Stomach cancer in her cousin. There is no history of Colon cancer or Esophageal cancer. Allergies  Allergen Reactions  . Actos [Pioglitazone] Swelling    Swelling  . Sulfonamide Derivatives Swelling  . Codeine Nausea And Vomiting      Review of Systems  Constitutional: Negative for appetite change, fatigue and unexpected weight change.  Eyes: Negative for visual disturbance.  Respiratory: Negative for cough, chest tightness, shortness of breath and wheezing.   Cardiovascular: Negative for chest pain, palpitations and leg swelling.  Gastrointestinal: Negative for nausea and vomiting.  Endocrine: Negative for polydipsia and polyuria.  Genitourinary: Negative for dysuria.  Neurological: Negative for dizziness, seizures, syncope, weakness, light-headedness and headaches.  Psychiatric/Behavioral: Negative for confusion.       Objective:   Physical Exam  Constitutional: She is oriented to person, place, and time. She appears well-developed and well-nourished. No distress.  Neck: Neck supple. No thyromegaly present.  Cardiovascular: Normal rate and regular rhythm.   Pulmonary/Chest: Effort normal and breath sounds normal. No respiratory distress. She has no wheezes. She has no rales.  Musculoskeletal: She  exhibits no edema.  Neurological: She is alert and oriented to person, place, and time.          Assessment & Plan:  #1 hypertension. Stable. Continue current medications #2 hyperlipidemia. Ordered future fasting labs with lipid panel and hepatic panel. #3 CAD history. No recent chest pains. She is encouraged to keep  regular follow-up with cardiology.  Reminder for yearly flu vaccine

## 2014-11-21 ENCOUNTER — Other Ambulatory Visit: Payer: Self-pay | Admitting: Family Medicine

## 2014-11-21 ENCOUNTER — Telehealth: Payer: Self-pay | Admitting: Family Medicine

## 2014-11-21 ENCOUNTER — Other Ambulatory Visit: Payer: Medicare Other

## 2014-11-21 DIAGNOSIS — E785 Hyperlipidemia, unspecified: Secondary | ICD-10-CM

## 2014-11-21 NOTE — Telephone Encounter (Signed)
Due to pt's husband having an appointment at [facility] tomorrow morning, pt is requesting to have labs drawn over there.  Please enter labs that were scheduled at [facility] for today to have collected at [facility] tomorrow.  Those labs were lipid, BMP and hepatic function.

## 2014-11-21 NOTE — Telephone Encounter (Signed)
Labs ordered and changed to [place] office.

## 2014-11-22 ENCOUNTER — Other Ambulatory Visit (INDEPENDENT_AMBULATORY_CARE_PROVIDER_SITE_OTHER): Payer: Medicare Other

## 2014-11-22 DIAGNOSIS — E785 Hyperlipidemia, unspecified: Secondary | ICD-10-CM | POA: Diagnosis not present

## 2014-11-22 LAB — BASIC METABOLIC PANEL
BUN: 18 mg/dL (ref 6–23)
CO2: 30 mEq/L (ref 19–32)
Calcium: 9.1 mg/dL (ref 8.4–10.5)
Chloride: 107 mEq/L (ref 96–112)
Creatinine, Ser: 0.77 mg/dL (ref 0.40–1.20)
GFR: 76.65 mL/min (ref >60.00)
Glucose, Bld: 86 mg/dL (ref 70–99)
Potassium: 4 mEq/L (ref 3.5–5.1)
Sodium: 143 mEq/L (ref 135–145)

## 2014-11-22 LAB — LIPID PANEL
Cholesterol: 146 mg/dL (ref 0–200)
HDL: 69.2 mg/dL (ref >39.00)
LDL Cholesterol: 66 mg/dL (ref 0–99)
NonHDL: 76.8
Total CHOL/HDL Ratio: 2
Triglycerides: 53 mg/dL (ref 0.0–149.0)
VLDL: 10.6 mg/dL (ref 0.0–40.0)

## 2014-11-22 LAB — HEPATIC FUNCTION PANEL
ALT: 13 U/L (ref 0–35)
AST: 22 U/L (ref 0–37)
Albumin: 3.8 g/dL (ref 3.5–5.2)
Alkaline Phosphatase: 38 U/L — ABNORMAL LOW (ref 39–117)
Bilirubin, Direct: 0.1 mg/dL (ref 0.0–0.3)
Total Bilirubin: 0.5 mg/dL (ref 0.2–1.2)
Total Protein: 6.6 g/dL (ref 6.0–8.3)

## 2014-11-30 ENCOUNTER — Other Ambulatory Visit: Payer: Self-pay | Admitting: *Deleted

## 2014-11-30 ENCOUNTER — Other Ambulatory Visit: Payer: Self-pay | Admitting: Gastroenterology

## 2014-11-30 DIAGNOSIS — M5416 Radiculopathy, lumbar region: Secondary | ICD-10-CM | POA: Diagnosis not present

## 2014-11-30 DIAGNOSIS — M5413 Radiculopathy, cervicothoracic region: Secondary | ICD-10-CM | POA: Diagnosis not present

## 2014-11-30 DIAGNOSIS — M9902 Segmental and somatic dysfunction of thoracic region: Secondary | ICD-10-CM | POA: Diagnosis not present

## 2014-11-30 DIAGNOSIS — M9905 Segmental and somatic dysfunction of pelvic region: Secondary | ICD-10-CM | POA: Diagnosis not present

## 2014-11-30 DIAGNOSIS — M9903 Segmental and somatic dysfunction of lumbar region: Secondary | ICD-10-CM | POA: Diagnosis not present

## 2014-11-30 MED ORDER — SPIRONOLACTONE 25 MG PO TABS: 25.0000 mg | ORAL_TABLET | Freq: Every day | ORAL | Status: DC

## 2014-11-30 NOTE — Telephone Encounter (Signed)
Rx done. 

## 2014-12-19 DIAGNOSIS — Z1231 Encounter for screening mammogram for malignant neoplasm of breast: Secondary | ICD-10-CM | POA: Diagnosis not present

## 2015-01-02 ENCOUNTER — Encounter: Payer: Self-pay | Admitting: Family Medicine

## 2015-01-04 DIAGNOSIS — E539 Vitamin B deficiency, unspecified: Secondary | ICD-10-CM | POA: Diagnosis not present

## 2015-01-11 DIAGNOSIS — M9903 Segmental and somatic dysfunction of lumbar region: Secondary | ICD-10-CM | POA: Diagnosis not present

## 2015-01-11 DIAGNOSIS — M5413 Radiculopathy, cervicothoracic region: Secondary | ICD-10-CM | POA: Diagnosis not present

## 2015-01-11 DIAGNOSIS — M9905 Segmental and somatic dysfunction of pelvic region: Secondary | ICD-10-CM | POA: Diagnosis not present

## 2015-01-11 DIAGNOSIS — M5416 Radiculopathy, lumbar region: Secondary | ICD-10-CM | POA: Diagnosis not present

## 2015-01-11 DIAGNOSIS — M9902 Segmental and somatic dysfunction of thoracic region: Secondary | ICD-10-CM | POA: Diagnosis not present

## 2015-01-19 ENCOUNTER — Other Ambulatory Visit: Payer: Self-pay | Admitting: Family Medicine

## 2015-02-02 DIAGNOSIS — E539 Vitamin B deficiency, unspecified: Secondary | ICD-10-CM | POA: Diagnosis not present

## 2015-02-06 ENCOUNTER — Other Ambulatory Visit: Payer: Self-pay | Admitting: Family Medicine

## 2015-02-12 DIAGNOSIS — M810 Age-related osteoporosis without current pathological fracture: Secondary | ICD-10-CM | POA: Diagnosis not present

## 2015-03-01 DIAGNOSIS — M5413 Radiculopathy, cervicothoracic region: Secondary | ICD-10-CM | POA: Diagnosis not present

## 2015-03-01 DIAGNOSIS — M9905 Segmental and somatic dysfunction of pelvic region: Secondary | ICD-10-CM | POA: Diagnosis not present

## 2015-03-01 DIAGNOSIS — M5416 Radiculopathy, lumbar region: Secondary | ICD-10-CM | POA: Diagnosis not present

## 2015-03-01 DIAGNOSIS — M9902 Segmental and somatic dysfunction of thoracic region: Secondary | ICD-10-CM | POA: Diagnosis not present

## 2015-03-01 DIAGNOSIS — M9903 Segmental and somatic dysfunction of lumbar region: Secondary | ICD-10-CM | POA: Diagnosis not present

## 2015-03-02 ENCOUNTER — Encounter: Payer: Self-pay | Admitting: Family Medicine

## 2015-03-02 ENCOUNTER — Ambulatory Visit (INDEPENDENT_AMBULATORY_CARE_PROVIDER_SITE_OTHER): Payer: Medicare Other | Admitting: Family Medicine

## 2015-03-02 VITALS — BP 100/80 | HR 64 | Temp 97.8°F | Ht 64.0 in | Wt 101.4 lb

## 2015-03-02 DIAGNOSIS — M25512 Pain in left shoulder: Secondary | ICD-10-CM

## 2015-03-02 NOTE — Progress Notes (Signed)
HPI:  Acute visit for L shoulder pain: -started about 5 days ago after she tripped  and caught herself on her L knee and L arm -moderate soreness (achy and sometimes sharp) developed over the next few days in the L shoulder, L upper back and L arm -denies inability to use arm or shoulder, severe initial pain or pop, weakness, numbness, head or neck injury, malaise or LOC ROS: See pertinent positives and negatives per HPI.  Past Medical History  Diagnosis Date  . PVD (peripheral vascular disease) ([place])   . Anemia, pernicious   . Atrophic gastritis   . Hypothyroidism   . HLD (hyperlipidemia)   . HTN (hypertension)   . CAD (coronary artery disease)   . Carotid artery stenosis   . Median arcuate ligament syndrome ([place])   . Arthritis     bursitis   . Irritable bowel syndrome (IBS)   . Tubular adenoma of colon 08/2013    with focal high grade dysplasia  . B12 deficiency   . Gallbladder polyp   . Family history of adverse reaction to anesthesia     extreme nausea brother and sister  . Heart murmur   . History of kidney stones   . GERD (gastroesophageal reflux disease)   . History of hiatal hernia   . Fibromyalgia     Past Surgical History  Procedure Laterality Date  . Appendectomy    . Biopsy thyroid    . Thyroid lobectomy    . Abdominal hysterectomy    . Esophageal manometry N/A 08/02/2012    Procedure: ESOPHAGEAL MANOMETRY (EM);  Surgeon: Sable Feil, MD;  Location: [facility];  Service: Endoscopy;  Laterality: N/A;  . Coronary artery bypass graft  07  . Cholecystectomy N/A 10/24/2014    Procedure: MICROSCOPIC  CHOLECYSTECTOMY;  Surgeon: Ralene Ok, MD;  Location: [facility] OR;  Service: General;  Laterality: N/A;    Family History  Problem Relation Age of Onset  . Heart disease Mother   . Prostate cancer Brother   . Breast cancer Other     niece - breast cancer  . Diabetes Sister   . Lung cancer Father   . Lung cancer Sister   . Colon cancer Neg Hx   .  Esophageal cancer Neg Hx   . Stomach cancer Cousin   . Cholelithiasis Mother     Social History   Social History  . Marital Status: Married    Spouse Name: N/A  . Number of Children: N/A  . Years of Education: 12   Occupational History  . retired   .     Social History Main Topics  . Smoking status: Never Smoker   . Smokeless tobacco: Never Used  . Alcohol Use: No  . Drug Use: No  . Sexual Activity: Not Asked   Other Topics Concern  . None   Social History Narrative   [name], Married. Lives with husband. Had been a very active woman.     Current outpatient prescriptions:  .  acetaminophen (TYLENOL) 500 MG tablet, Take 500 mg by mouth every 6 (six) hours as needed (pain)., Disp: , Rfl:  .  amLODipine (NORVASC) 5 MG tablet, Take 1 tablet (5 mg total) by mouth daily., Disp: 30 tablet, Rfl: 6 .  aspirin EC 81 MG tablet, Take 81 mg by mouth at bedtime., Disp: , Rfl:  .  atorvastatin (LIPITOR) 80 MG tablet, TAKE 1 TABLET BY MOUTH AT BEDTIME., Disp: 30 tablet, Rfl: 5 .  azelastine (  ASTELIN) 0.1 % nasal spray, Place 1 spray into both nostrils 2 (two) times daily as needed for rhinitis or allergies. Use in each nostril as directed, Disp: , Rfl:  .  Calcium Carbonate-Vitamin D (CALTRATE 600+D PO), Take 600 mg by mouth daily with lunch., Disp: , Rfl:  .  CARAFATE 1 GM/10ML suspension, TAKE 10 MLS (1 G TOTAL) BY MOUTH 2 (TWO) TIMES DAILY., Disp: 420 mL, Rfl: 3 .  carvedilol (COREG) 6.25 MG tablet, TAKE 1/2 TABLET BY MOUTH 2 TIMES DAILY, Disp: 60 tablet, Rfl: 2 .  Cholecalciferol (VITAMIN D) 2000 UNITS tablet, Take 2,000 Units by mouth daily with lunch., Disp: , Rfl:  .  cyanocobalamin (,VITAMIN B-12,) 1000 MCG/ML injection, Inject 1,000 mcg into the muscle every 30 (thirty) days. On or about the end of each month, Disp: , Rfl:  .  fluticasone (FLONASE) 50 MCG/ACT nasal spray, Place 2 sprays into the nose daily as needed. (Patient taking differently: Place 1 spray into both nostrils daily  as needed for allergies. ), Disp: 16 g, Rfl: 11 .  levothyroxine (SYNTHROID, LEVOTHROID) 75 MCG tablet, Take 75 mcg by mouth at bedtime. , Disp: , Rfl:  .  losartan (COZAAR) 100 MG tablet, Take 1 tablet (100 mg total) by mouth daily. (Patient taking differently: Take 100 mg by mouth at bedtime. ), Disp: 30 tablet, Rfl: 11 .  Multiple Vitamins-Minerals (PRESERVISION AREDS 2) CAPS, Take 1 tablet by mouth daily with lunch., Disp: , Rfl:  .  OVER THE COUNTER MEDICATION, Place 1 drop into both eyes 2 (two) times daily as needed (dry eyes). Preservative free over the counter eye drop for dry eyes, Disp: , Rfl:  .  oxyCODONE-acetaminophen (ROXICET) 5-325 MG per tablet, Take 1-2 tablets by mouth every 4 (four) hours as needed., Disp: 30 tablet, Rfl: 0 .  saccharomyces boulardii (FLORASTOR) 250 MG capsule, Take 250 mg by mouth daily with lunch., Disp: , Rfl:  .  sodium chloride (OCEAN) 0.65 % nasal spray, Place 1 spray into both nostrils 3 (three) times daily as needed for congestion. , Disp: , Rfl:  .  spironolactone (ALDACTONE) 25 MG tablet, Take 1 tablet (25 mg total) by mouth daily., Disp: 30 tablet, Rfl: 5 .  traMADol (ULTRAM) 50 MG tablet, Take 50 mg by mouth every 6 (six) hours as needed (pain). , Disp: , Rfl:   EXAM:  Filed Vitals:   03/02/15 1307  BP: 100/80  Pulse: 64  Temp: 97.8 F (36.6 C)    Body mass index is 17.4 kg/(m^2).  GENERAL: vitals reviewed and listed above, alert, oriented, appears well hydrated and in no acute distress  HEENT: atraumatic, conjunttiva clear, no obvious abnormalities on inspection of external nose and ears  NECK: no obvious masses on inspection  MS: moves all extremities without noticeable abnormality; she can easily remove her shirt and lift both arms above her head and in all directions, no signs of trauma, bruising or swelling on inspection of arms, shoulders, chest and back, normal ROM of head/neck and arms; some muscle TTP in multiple muscle groups  (RTC, traps, rhomboids and triceps), no bony TTP, normal strength, sensation to light touch and DTRs in upper extremities bilat, normal radial pulses  PSYCH: pleasant and cooperative, no obvious depression or anxiety  ASSESSMENT AND PLAN:  Discussed the following assessment and plan:  Shoulder pain, left  -suspect muscular strain given symptoms and exam -discussed potential etiologies and fx or nerve injury unlikely -opted for observation, analgesic, heat and HEP  -  follow up in 4 weeks -Patient advised to return or notify a doctor immediately if symptoms worsen or persist or new concerns arise.  Patient Instructions  BEFORE YOU LEAVE: -rotator cuff exercises -schedule follow up in 1 month, follow up sooner if worsening or other concerns  Tylenol 500-1000mg  up to 3 times daily as needed for pain  Do the exercises 3-4 days per week  Heat can also help muscle soreness      Montee Tallman R.

## 2015-03-02 NOTE — Progress Notes (Signed)
Pre visit review using our clinic review tool, if applicable. No additional management support is needed unless otherwise documented below in the visit note. 

## 2015-03-02 NOTE — Patient Instructions (Signed)
BEFORE YOU LEAVE: -rotator cuff exercises -schedule follow up in 1 month, follow up sooner if worsening or other concerns  Tylenol 500-1000mg  up to 3 times daily as needed for pain  Do the exercises 3-4 days per week  Heat can also help muscle soreness

## 2015-03-07 DIAGNOSIS — M5413 Radiculopathy, cervicothoracic region: Secondary | ICD-10-CM | POA: Diagnosis not present

## 2015-03-07 DIAGNOSIS — M9905 Segmental and somatic dysfunction of pelvic region: Secondary | ICD-10-CM | POA: Diagnosis not present

## 2015-03-07 DIAGNOSIS — M5416 Radiculopathy, lumbar region: Secondary | ICD-10-CM | POA: Diagnosis not present

## 2015-03-07 DIAGNOSIS — M9902 Segmental and somatic dysfunction of thoracic region: Secondary | ICD-10-CM | POA: Diagnosis not present

## 2015-03-07 DIAGNOSIS — M9903 Segmental and somatic dysfunction of lumbar region: Secondary | ICD-10-CM | POA: Diagnosis not present

## 2015-03-13 DIAGNOSIS — M5413 Radiculopathy, cervicothoracic region: Secondary | ICD-10-CM | POA: Diagnosis not present

## 2015-03-13 DIAGNOSIS — M9902 Segmental and somatic dysfunction of thoracic region: Secondary | ICD-10-CM | POA: Diagnosis not present

## 2015-03-13 DIAGNOSIS — M9905 Segmental and somatic dysfunction of pelvic region: Secondary | ICD-10-CM | POA: Diagnosis not present

## 2015-03-13 DIAGNOSIS — M5416 Radiculopathy, lumbar region: Secondary | ICD-10-CM | POA: Diagnosis not present

## 2015-03-13 DIAGNOSIS — M9903 Segmental and somatic dysfunction of lumbar region: Secondary | ICD-10-CM | POA: Diagnosis not present

## 2015-03-14 ENCOUNTER — Ambulatory Visit (HOSPITAL_COMMUNITY)
Admission: RE | Admit: 2015-03-14 | Discharge: 2015-03-14 | Disposition: A | Discharge status: Home / Self Care | Payer: Medicare Other | Source: Ambulatory Visit | Attending: Internal Medicine | Admitting: Internal Medicine

## 2015-03-14 ENCOUNTER — Encounter (HOSPITAL_COMMUNITY): Payer: Self-pay

## 2015-03-14 DIAGNOSIS — M810 Age-related osteoporosis without current pathological fracture: Secondary | ICD-10-CM | POA: Diagnosis not present

## 2015-03-14 DIAGNOSIS — K219 Gastro-esophageal reflux disease without esophagitis: Secondary | ICD-10-CM | POA: Insufficient documentation

## 2015-03-14 MED ORDER — ZOLEDRONIC ACID 5 MG/100ML IV SOLN
5.0000 mg | Freq: Once | INTRAVENOUS | Status: AC
  Administered 2015-03-14: 5 mg via INTRAVENOUS
  Filled 2015-03-14: qty 100

## 2015-03-14 MED ORDER — SODIUM CHLORIDE 0.9 % IV SOLN
INTRAVENOUS | Status: DC
  Administered 2015-03-14: 14:00:00 via INTRAVENOUS

## 2015-03-14 NOTE — Discharge Instructions (Signed)

## 2015-03-15 DIAGNOSIS — R062 Wheezing: Secondary | ICD-10-CM | POA: Diagnosis not present

## 2015-03-15 DIAGNOSIS — K219 Gastro-esophageal reflux disease without esophagitis: Secondary | ICD-10-CM | POA: Diagnosis not present

## 2015-03-15 DIAGNOSIS — J301 Allergic rhinitis due to pollen: Secondary | ICD-10-CM | POA: Diagnosis not present

## 2015-03-26 DIAGNOSIS — D519 Vitamin B12 deficiency anemia, unspecified: Secondary | ICD-10-CM | POA: Diagnosis not present

## 2015-03-27 DIAGNOSIS — H353132 Nonexudative age-related macular degeneration, bilateral, intermediate dry stage: Secondary | ICD-10-CM | POA: Diagnosis not present

## 2015-04-05 ENCOUNTER — Ambulatory Visit: Payer: Medicare Other | Admitting: Family Medicine

## 2015-04-23 ENCOUNTER — Other Ambulatory Visit: Payer: Self-pay | Admitting: Family Medicine

## 2015-05-02 DIAGNOSIS — E539 Vitamin B deficiency, unspecified: Secondary | ICD-10-CM | POA: Diagnosis not present

## 2015-05-02 DIAGNOSIS — D519 Vitamin B12 deficiency anemia, unspecified: Secondary | ICD-10-CM | POA: Diagnosis not present

## 2015-05-21 ENCOUNTER — Ambulatory Visit (INDEPENDENT_AMBULATORY_CARE_PROVIDER_SITE_OTHER): Payer: Medicare Other | Admitting: Family Medicine

## 2015-05-21 ENCOUNTER — Encounter: Payer: Self-pay | Admitting: Family Medicine

## 2015-05-21 VITALS — BP 160/64 | HR 72 | Temp 98.1°F | Ht 64.0 in | Wt 101.7 lb

## 2015-05-21 DIAGNOSIS — I10 Essential (primary) hypertension: Secondary | ICD-10-CM

## 2015-05-21 DIAGNOSIS — M5416 Radiculopathy, lumbar region: Secondary | ICD-10-CM | POA: Diagnosis not present

## 2015-05-21 MED ORDER — AMLODIPINE BESYLATE 10 MG PO TABS: 10.0000 mg | ORAL_TABLET | Freq: Every day | ORAL | Status: DC

## 2015-05-21 NOTE — Progress Notes (Signed)
Subjective:    Patient ID: [name], female    DOB: Feb 07, 1933, 82 y.o.   MRN: [id number]  HPI  patient here for the following issues   Hypertension. Currently on regimen including amlodipine, carvedilol , losartan , and Aldactone. Compliant with therapy. She's had some recent elevated readings one was as high as 99991111 systolic. She's not had any chest pains or headaches. Denies any peripheral edema. She does not use alcohol. No recent nonsteroidal use.   She has history of CAD. No recent chest pains. She has hypothyroidism followed by endocrinology.   She complains of some recent pains radiating from her left lower lumbar area into the buttock and occasionally down the left lower extremity. She has not had difficulties with ambulation. No loss of urine or bowel control. She's not had any recent appetite or weight changes. She's never had any imaging of her lumbar spine.  Past Medical History  Diagnosis Date  . PVD (peripheral vascular disease) ([place])   . Anemia, pernicious   . Atrophic gastritis   . Hypothyroidism   . HLD (hyperlipidemia)   . HTN (hypertension)   . CAD (coronary artery disease)   . Carotid artery stenosis   . Median arcuate ligament syndrome ([place])   . Arthritis     bursitis   . Irritable bowel syndrome (IBS)   . Tubular adenoma of colon 08/2013    with focal high grade dysplasia  . B12 deficiency   . Gallbladder polyp   . Family history of adverse reaction to anesthesia     extreme nausea brother and sister  . Heart murmur   . History of kidney stones   . GERD (gastroesophageal reflux disease)   . History of hiatal hernia   . Fibromyalgia    Past Surgical History  Procedure Laterality Date  . Appendectomy    . Biopsy thyroid    . Thyroid lobectomy    . Abdominal hysterectomy    . Esophageal manometry N/A 08/02/2012    Procedure: ESOPHAGEAL MANOMETRY (EM);  Surgeon: Sable Feil, MD;  Location: [facility];  Service: Endoscopy;  Laterality: N/A;   . Coronary artery bypass graft  07  . Cholecystectomy N/A 10/24/2014    Procedure: MICROSCOPIC  CHOLECYSTECTOMY;  Surgeon: Ralene Ok, MD;  Location: [facility];  Service: General;  Laterality: N/A;    reports that she has never smoked. She has never used smokeless tobacco. She reports that she does not drink alcohol or use illicit drugs. family history includes Breast cancer in her other; Cholelithiasis in her mother; Diabetes in her sister; Heart disease in her mother; Lung cancer in her father and sister; Prostate cancer in her brother; Stomach cancer in her cousin. There is no history of Colon cancer or Esophageal cancer. Allergies  Allergen Reactions  . Actos [Pioglitazone] Swelling    Swelling  . Sulfonamide Derivatives Swelling  . Codeine Nausea And Vomiting      Review of Systems  Constitutional: Negative for fatigue.  Eyes: Negative for visual disturbance.  Respiratory: Negative for cough, chest tightness, shortness of breath and wheezing.   Cardiovascular: Negative for chest pain, palpitations and leg swelling.  Neurological: Negative for dizziness, seizures, syncope, weakness, light-headedness and headaches.       Objective:   Physical Exam  Constitutional: She appears well-developed and well-nourished.  Cardiovascular: Normal rate and regular rhythm.   Pulmonary/Chest: Effort normal and breath sounds normal. No respiratory distress. She has no wheezes. She has no rales.  Musculoskeletal: She exhibits no edema.  Straight leg raise are negative  Neurological:  Symmetric knee. Slightly diminished reflex ankle her right. She has slight decreased strength with left dorsiflexion compared to the right. Plantar flexion is maintained          Assessment & Plan:   #1 hypertension. Suboptimal control. Titrate amlodipine to 10 mg daily. Watch closely for peripheral edema issues  #2 left lumbar radiculopathy symptoms. Patient does have question of slight loss of strength  with left dorsiflexion also slightly diminished reflex-left ankle. Watch closely for any progression. Her symptoms have been worse at night but fairly tolerable overall. She is ambulating without much difficulty. Reassess at 1 month at follow-up. If not improved at that point consider MRI lumbar spine and sooner if she develops any weakness between now and then

## 2015-05-21 NOTE — Progress Notes (Signed)
Pre visit review using our clinic review tool, if applicable. No additional management support is needed unless otherwise documented below in the visit note. 

## 2015-05-28 ENCOUNTER — Telehealth: Payer: Self-pay | Admitting: Family Medicine

## 2015-05-28 NOTE — Telephone Encounter (Signed)
Patient Name: SERENIA SMARR  DOB: 12-08-1932    Initial Comment caller states she has a question about her medication   Nurse Assessment  Nurse: Verlin Fester, RN, [facility] Date/Time Eilene Ghazi Time): 05/28/2015 9:53:30 AM  Confirm and document reason for call. If symptomatic, describe symptoms. You must click the next button to save text entered. ---,Patient states she saw the doctor recently and he told her she could take her Coreg 1/2 tablet twice a daily at just one tablet dailly. When she got her medicine her Amlodipine 5mg  was changed to 10 mg. States she didn't remember him telling her that he was increasing this medicine.  Has the patient traveled out of the country within the last 30 days? ---Not Applicable  Does the patient have any new or worsening symptoms? ---No  Please document clinical information provided and list any resource used. ---Looked up patient record and instructed her that it does say increase to 10mg  on 05/21/15. Patient asks that we check with Dr. to make sure that is correct     Guidelines    Guideline Title Affirmed Question Affirmed Notes       Final Disposition User   Clinical Call Verlin Fester, RN, [facility]

## 2015-05-28 NOTE — Telephone Encounter (Signed)
Spoke with patient and did make her aware that her amlodipine was increased to 10mg  daily. She is agreeable.

## 2015-06-07 ENCOUNTER — Other Ambulatory Visit: Payer: Self-pay | Admitting: Family Medicine

## 2015-06-11 DIAGNOSIS — D519 Vitamin B12 deficiency anemia, unspecified: Secondary | ICD-10-CM | POA: Diagnosis not present

## 2015-06-22 ENCOUNTER — Ambulatory Visit (INDEPENDENT_AMBULATORY_CARE_PROVIDER_SITE_OTHER): Payer: Medicare Other | Admitting: Family Medicine

## 2015-06-22 VITALS — BP 150/70 | HR 70 | Temp 97.8°F | Ht 64.0 in | Wt 102.8 lb

## 2015-06-22 DIAGNOSIS — I10 Essential (primary) hypertension: Secondary | ICD-10-CM

## 2015-06-22 DIAGNOSIS — Z862 Personal history of diseases of the blood and blood-forming organs and certain disorders involving the immune mechanism: Secondary | ICD-10-CM

## 2015-06-22 LAB — COMPREHENSIVE METABOLIC PANEL
ALT: 18 U/L (ref 0–35)
AST: 28 U/L (ref 0–37)
Albumin: 4.3 g/dL (ref 3.5–5.2)
Alkaline Phosphatase: 40 U/L (ref 39–117)
BUN: 24 mg/dL — ABNORMAL HIGH (ref 6–23)
CO2: 31 mEq/L (ref 19–32)
Calcium: 9.8 mg/dL (ref 8.4–10.5)
Chloride: 105 mEq/L (ref 96–112)
Creatinine, Ser: 1.25 mg/dL — ABNORMAL HIGH (ref 0.40–1.20)
GFR: 43.76 mL/min — ABNORMAL LOW (ref >60.00)
Glucose, Bld: 95 mg/dL (ref 70–99)
Potassium: 5.1 mEq/L (ref 3.5–5.1)
Sodium: 142 mEq/L (ref 135–145)
Total Bilirubin: 0.5 mg/dL (ref 0.2–1.2)
Total Protein: 7 g/dL (ref 6.0–8.3)

## 2015-06-22 LAB — VITAMIN B12: Vitamin B-12: 584 pg/mL (ref 211–911)

## 2015-06-22 NOTE — Progress Notes (Signed)
Subjective:    Patient ID: [name], female    DOB: October 02, 1932, 82 y.o.   MRN: [id number]  HPI Follow-up hypertension. Refer to recent note. We increased her amlodipine to 10 mg once daily. She's not had any headaches or peripheral edema or other side effects. Blood pressure stable by home readings. Denies any recent dizziness. Compliant with therapy. She also remains on Aldactone and losartan.  History of reported pernicious anemia. She apparently has gotten B12 injections from a clinic down in [facility] for quite some time. No recent B12 levels. She has not had evidence for any recent anemia. Requesting B12 level today.  Past Medical History  Diagnosis Date  . PVD (peripheral vascular disease) ([place])   . Anemia, pernicious   . Atrophic gastritis   . Hypothyroidism   . HLD (hyperlipidemia)   . HTN (hypertension)   . CAD (coronary artery disease)   . Carotid artery stenosis   . Median arcuate ligament syndrome ([place])   . Arthritis     bursitis   . Irritable bowel syndrome (IBS)   . Tubular adenoma of colon 08/2013    with focal high grade dysplasia  . B12 deficiency   . Gallbladder polyp   . Family history of adverse reaction to anesthesia     extreme nausea brother and sister  . Heart murmur   . History of kidney stones   . GERD (gastroesophageal reflux disease)   . History of hiatal hernia   . Fibromyalgia    Past Surgical History  Procedure Laterality Date  . Appendectomy    . Biopsy thyroid    . Thyroid lobectomy    . Abdominal hysterectomy    . Esophageal manometry N/A 08/02/2012    Procedure: ESOPHAGEAL MANOMETRY (EM);  Surgeon: Sable Feil, MD;  Location: [facility];  Service: Endoscopy;  Laterality: N/A;  . Coronary artery bypass graft  07  . Cholecystectomy N/A 10/24/2014    Procedure: MICROSCOPIC  CHOLECYSTECTOMY;  Surgeon: Ralene Ok, MD;  Location: [place];  Service: General;  Laterality: N/A;    reports that she has never smoked. She  has never used smokeless tobacco. She reports that she does not drink alcohol or use illicit drugs. family history includes Breast cancer in her other; Cholelithiasis in her mother; Diabetes in her sister; Heart disease in her mother; Lung cancer in her father and sister; Prostate cancer in her brother; Stomach cancer in her cousin. There is no history of Colon cancer or Esophageal cancer. Allergies  Allergen Reactions  . Actos [Pioglitazone] Swelling    Swelling  . Sulfonamide Derivatives Swelling  . Codeine Nausea And Vomiting      Review of Systems  Constitutional: Negative for fatigue.  Eyes: Negative for visual disturbance.  Respiratory: Negative for cough, chest tightness, shortness of breath and wheezing.   Cardiovascular: Negative for chest pain, palpitations and leg swelling.  Neurological: Negative for dizziness, seizures, syncope, weakness, light-headedness and headaches.       Objective:   Physical Exam  Constitutional: She appears well-developed and well-nourished.  Eyes: Pupils are equal, round, and reactive to light.  Neck: Neck supple. No JVD present. No thyromegaly present.  Cardiovascular: Normal rate and regular rhythm.  Exam reveals no gallop.   Pulmonary/Chest: Effort normal and breath sounds normal. No respiratory distress. She has no wheezes. She has no rales.  Musculoskeletal: She exhibits no edema.  Neurological: She is alert.          Assessment &  Plan:  #1 hypertension. Improved. Continue current medications. Continue close monitoring Repeat basic metabolic panel  #2 history of pernicious anemia. Recheck B12 level

## 2015-06-22 NOTE — Progress Notes (Signed)
Pre visit review using our clinic review tool, if applicable. No additional management support is needed unless otherwise documented below in the visit note. 

## 2015-07-02 DIAGNOSIS — H5213 Myopia, bilateral: Secondary | ICD-10-CM | POA: Diagnosis not present

## 2015-07-02 DIAGNOSIS — H353132 Nonexudative age-related macular degeneration, bilateral, intermediate dry stage: Secondary | ICD-10-CM | POA: Diagnosis not present

## 2015-07-02 DIAGNOSIS — H04123 Dry eye syndrome of bilateral lacrimal glands: Secondary | ICD-10-CM | POA: Diagnosis not present

## 2015-07-02 DIAGNOSIS — Z961 Presence of intraocular lens: Secondary | ICD-10-CM | POA: Diagnosis not present

## 2015-07-04 ENCOUNTER — Encounter: Payer: Self-pay | Admitting: Family Medicine

## 2015-07-04 ENCOUNTER — Ambulatory Visit (INDEPENDENT_AMBULATORY_CARE_PROVIDER_SITE_OTHER): Payer: Medicare Other | Admitting: Family Medicine

## 2015-07-04 VITALS — BP 160/70 | HR 67 | Temp 98.3°F | Wt 104.1 lb

## 2015-07-04 DIAGNOSIS — M7582 Other shoulder lesions, left shoulder: Secondary | ICD-10-CM

## 2015-07-04 DIAGNOSIS — M778 Other enthesopathies, not elsewhere classified: Secondary | ICD-10-CM

## 2015-07-04 MED ORDER — METHYLPREDNISOLONE ACETATE 80 MG/ML IJ SUSP
80.0000 mg | Freq: Once | INTRAMUSCULAR | Status: AC
  Administered 2015-07-04: 80 mg via INTRA_ARTICULAR

## 2015-07-04 NOTE — Progress Notes (Signed)
Pre visit review using our clinic review tool, if applicable. No additional management support is needed unless otherwise documented below in the visit note. 

## 2015-07-04 NOTE — Progress Notes (Signed)
Subjective:    Patient ID: [name], female    DOB: May 01, 1932, 82 y.o.   MRN: [id number]  HPI Patient seen for left shoulder pain. Back in November 2016 she fell on outstretched hand and noticed some shoulder pain then. She was seen here but no x-rays were done. She denied any history of bruising or swelling. Her pain eventually improved after couple weeks but then last week she noticed recurrence of left shoulder pain. Poorly localized. Pain is mostly subacromial region and achy quality Worse at night. Not improved with ice, heat, or Tylenol. Worse with abduction.  Occasional pain base cervical spine but no classic radiculopathy symptoms. Denies upper extremity numbness or weakness.  Past Medical History  Diagnosis Date  . PVD (peripheral vascular disease) ([place])   . Anemia, pernicious   . Atrophic gastritis   . Hypothyroidism   . HLD (hyperlipidemia)   . HTN (hypertension)   . CAD (coronary artery disease)   . Carotid artery stenosis   . Median arcuate ligament syndrome ([place])   . Arthritis     bursitis   . Irritable bowel syndrome (IBS)   . Tubular adenoma of colon 08/2013    with focal high grade dysplasia  . B12 deficiency   . Gallbladder polyp   . Family history of adverse reaction to anesthesia     extreme nausea brother and sister  . Heart murmur   . History of kidney stones   . GERD (gastroesophageal reflux disease)   . History of hiatal hernia   . Fibromyalgia    Past Surgical History  Procedure Laterality Date  . Appendectomy    . Biopsy thyroid    . Thyroid lobectomy    . Abdominal hysterectomy    . Esophageal manometry N/A 08/02/2012    Procedure: ESOPHAGEAL MANOMETRY (EM);  Surgeon: Sable Feil, MD;  Location: [facility];  Service: Endoscopy;  Laterality: N/A;  . Coronary artery bypass graft  07  . Cholecystectomy N/A 10/24/2014    Procedure: MICROSCOPIC  CHOLECYSTECTOMY;  Surgeon: Ralene Ok, MD;  Location: [facility];  Service: General;   Laterality: N/A;    reports that she has never smoked. She has never used smokeless tobacco. She reports that she does not drink alcohol or use illicit drugs. family history includes Breast cancer in her other; Cholelithiasis in her mother; Diabetes in her sister; Heart disease in her mother; Lung cancer in her father and sister; Prostate cancer in her brother; Stomach cancer in her cousin. There is no history of Colon cancer or Esophageal cancer. Allergies  Allergen Reactions  . Actos [Pioglitazone] Swelling    Swelling  . Sulfonamide Derivatives Swelling  . Codeine Nausea And Vomiting      Review of Systems  Constitutional: Negative for fever and chills.  Respiratory: Negative for cough and shortness of breath.   Cardiovascular: Negative for chest pain.  Neurological: Negative for weakness and numbness.       Objective:   Physical Exam  Constitutional: She appears well-developed and well-nourished.  Cardiovascular: Normal rate and regular rhythm.   Pulmonary/Chest: Effort normal and breath sounds normal. No respiratory distress. She has no wheezes. She has no rales.  Musculoskeletal: She exhibits no edema.  Full range of motion both shoulders. No acromioclavicular tenderness. No biceps tenderness. Patient has pain with abduction against resistance but none with internal rotation. Tender to palpation left subacromial region. No visible swelling. No erythema or warmth  Neurological:  No upper extremity weakness noted.  Symmetric reflexes.          Assessment & Plan:  Left shoulder pain. Doubt rotator cuff tear. Suspect rotator cuff tendinitis. Not relieved with ice or heat.  Discussed risks and benefits of corticosteroid injection and patient consented.  After prepping skin with betadine, injected 1 mL depomedrol and 2 cc of plain xylocaine with 25 gauge one and one half inch needle using posterior lateral approach and pt tolerated well. Recommend gentle range of motion  exercises. Consider x-ray next week if not improving.

## 2015-07-04 NOTE — Patient Instructions (Signed)
Rotator Cuff Tendinitis  Rotator cuff tendinitis is inflammation of the tough, cord-like bands that connect muscle to bone (tendons) in your rotator cuff. Your rotator cuff is the collection of all the muscles and tendons that connect your arm to your shoulder. Your rotator cuff holds the head of your upper arm bone (humerus) in the cup (fossa) of your shoulder blade (scapula).  CAUSES  Rotator cuff tendinitis is usually caused by overusing the joint involved.   SIGNS AND SYMPTOMS  · Deep ache in the shoulder also felt on the outside upper arm over the shoulder muscle.  · Point tenderness over the area that is injured.  · Pain comes on gradually and becomes worse with lifting the arm to the side (abduction) or turning it inward (internal rotation).  · May lead to a chronic tear: When a rotator cuff tendon becomes inflamed, it runs the risk of losing its blood supply, causing some tendon fibers to die. This increases the risk that the tendon can fray and partially or completely tear.  DIAGNOSIS  Rotator cuff tendinitis is diagnosed by taking a medical history, performing a physical exam, and reviewing results of imaging exams. The medical history is useful to help determine the type of rotator cuff injury. The physical exam will include looking at the injured shoulder, feeling the injured area, and watching you do range-of-motion exercises. X-ray exams are typically done to rule out other causes of shoulder pain, such as fractures. MRI is the imaging exam usually used for significant shoulder injuries. Sometimes a dye study called CT arthrogram is done, but it is not as widely used as MRI. In some institutions, special ultrasound tests may also be used to aid in the diagnosis.  TREATMENT   Less Severe Cases  · Use of a sling to rest the shoulder for a short period of time. Prolonged use of the sling can cause stiffness, weakness, and loss of motion of the shoulder joint.  · Anti-inflammatory medicines, such as  ibuprofen or naproxen sodium, may be prescribed.  More Severe Cases  · Physical therapy.  · Use of steroid injections into the shoulder joint.  · Surgery.  HOME CARE INSTRUCTIONS   · Use a sling or splint until the pain decreases. Prolonged use of the sling can cause stiffness, weakness, and loss of motion of the shoulder joint.  · Apply ice to the injured area:    Put ice in a plastic bag.    Place a towel between your skin and the bag.    Leave the ice on for 20 minutes, 2-3 times a day.  · Try to avoid use other than gentle range of motion while your shoulder is painful. Use the shoulder and exercise only as directed by your health care provider. Stop exercises or range of motion if pain or discomfort increases, unless directed otherwise by your health care provider.  · Only take over-the-counter or prescription medicines for pain, discomfort, or fever as directed by your health care provider.  · If you were given a shoulder sling and straps (immobilizer), do not remove it except as directed, or until you see a health care provider for a follow-up exam. If you need to remove it, move your arm as little as possible or as directed.  · You may want to sleep on several pillows at night to lessen swelling and pain.  SEEK IMMEDIATE MEDICAL CARE IF:   · Your shoulder pain increases or new pain develops in your arm, hand,   or fingers and is not relieved with medicines.  · You have new, unexplained symptoms, especially increased numbness in the hands or loss of strength.  · You develop any worsening of the problems that brought you in for care.  · Your arm, hand, or fingers are numb or tingling.  · Your arm, hand, or fingers are swollen, painful, or turn white or blue.  MAKE SURE YOU:  · Understand these instructions.  · Will watch your condition.  · Will get help right away if you are not doing well or get worse.     This information is not intended to replace advice given to you by your health care provider. Make sure  you discuss any questions you have with your health care provider.     Document Released: 07/05/2003 Document Revised: 05/05/2014 Document Reviewed: 11/24/2012  Elsevier Interactive Patient Education ©2016 [employer]

## 2015-07-09 ENCOUNTER — Other Ambulatory Visit: Payer: Self-pay | Admitting: Family Medicine

## 2015-07-11 ENCOUNTER — Telehealth: Payer: Self-pay | Admitting: Family Medicine

## 2015-07-11 DIAGNOSIS — M25512 Pain in left shoulder: Secondary | ICD-10-CM

## 2015-07-11 NOTE — Telephone Encounter (Signed)
I have ordered left shoulder X-ray for [name].  Let pt know   Thx!

## 2015-07-11 NOTE — Telephone Encounter (Signed)
Per note next step would an xray please advise.

## 2015-07-11 NOTE — Telephone Encounter (Signed)
Pt was seen on 07-04-15 for shoulder pain and was given an injection. Pt said pain has return. Please advise

## 2015-07-12 ENCOUNTER — Other Ambulatory Visit (INDEPENDENT_AMBULATORY_CARE_PROVIDER_SITE_OTHER): Payer: Medicare Other

## 2015-07-12 ENCOUNTER — Ambulatory Visit (INDEPENDENT_AMBULATORY_CARE_PROVIDER_SITE_OTHER)
Admission: RE | Admit: 2015-07-12 | Discharge: 2015-07-12 | Disposition: A | Discharge status: Home / Self Care | Payer: Medicare Other | Source: Ambulatory Visit | Attending: Family Medicine | Admitting: Family Medicine

## 2015-07-12 DIAGNOSIS — M25512 Pain in left shoulder: Secondary | ICD-10-CM

## 2015-07-12 DIAGNOSIS — R0989 Other specified symptoms and signs involving the circulatory and respiratory systems: Secondary | ICD-10-CM

## 2015-07-12 DIAGNOSIS — E785 Hyperlipidemia, unspecified: Secondary | ICD-10-CM | POA: Diagnosis not present

## 2015-07-12 LAB — BASIC METABOLIC PANEL
BUN: 20 mg/dL (ref 6–23)
CO2: 28 mEq/L (ref 19–32)
Calcium: 9.3 mg/dL (ref 8.4–10.5)
Chloride: 105 mEq/L (ref 96–112)
Creatinine, Ser: 0.97 mg/dL (ref 0.40–1.20)
GFR: 58.63 mL/min — ABNORMAL LOW (ref >60.00)
Glucose, Bld: 97 mg/dL (ref 70–99)
Potassium: 4.3 mEq/L (ref 3.5–5.1)
Sodium: 140 mEq/L (ref 135–145)

## 2015-07-12 NOTE — Telephone Encounter (Signed)
Pt is aware and will have xray today.

## 2015-07-13 ENCOUNTER — Ambulatory Visit (INDEPENDENT_AMBULATORY_CARE_PROVIDER_SITE_OTHER)
Admission: RE | Admit: 2015-07-13 | Discharge: 2015-07-13 | Disposition: A | Discharge status: Home / Self Care | Payer: Medicare Other | Source: Ambulatory Visit | Attending: Family Medicine | Admitting: Family Medicine

## 2015-07-13 DIAGNOSIS — J984 Other disorders of lung: Secondary | ICD-10-CM | POA: Diagnosis not present

## 2015-07-13 DIAGNOSIS — R918 Other nonspecific abnormal finding of lung field: Secondary | ICD-10-CM | POA: Diagnosis not present

## 2015-07-13 DIAGNOSIS — R0989 Other specified symptoms and signs involving the circulatory and respiratory systems: Secondary | ICD-10-CM

## 2015-07-17 DIAGNOSIS — D519 Vitamin B12 deficiency anemia, unspecified: Secondary | ICD-10-CM | POA: Diagnosis not present

## 2015-07-18 ENCOUNTER — Telehealth: Payer: Self-pay | Admitting: Family Medicine

## 2015-07-18 NOTE — Telephone Encounter (Signed)
Pt states her shoulder is better but still has pain down her left arm, especially during the day. Pt states she is sleeping better as well. The pain has not gone away in her arm at all.

## 2015-07-18 NOTE — Telephone Encounter (Signed)
Any other recommendations for her?

## 2015-07-19 NOTE — Telephone Encounter (Signed)
Shoulder x-rays showed no acute abnormalities. Still possible she has some cervical nerve impingement. If no weakness and pain not severe would observe for now. Next step would likely be cervical MRI. I would not pursue this unless her pain is severe or weakness

## 2015-07-20 NOTE — Telephone Encounter (Signed)
Pt is aware. Her sx seemed to have improved some over the last few days. She will report any worsening sx.

## 2015-07-25 ENCOUNTER — Other Ambulatory Visit: Payer: Self-pay | Admitting: Family Medicine

## 2015-08-08 ENCOUNTER — Other Ambulatory Visit: Payer: Self-pay | Admitting: Family Medicine

## 2015-08-27 ENCOUNTER — Ambulatory Visit (INDEPENDENT_AMBULATORY_CARE_PROVIDER_SITE_OTHER): Payer: Medicare Other | Admitting: Family Medicine

## 2015-08-27 DIAGNOSIS — D509 Iron deficiency anemia, unspecified: Secondary | ICD-10-CM

## 2015-08-27 MED ORDER — CYANOCOBALAMIN 1000 MCG/ML IJ SOLN
1000.0000 ug | INTRAMUSCULAR | Status: DC
  Administered 2015-08-27: 1000 ug via INTRAMUSCULAR

## 2015-08-27 NOTE — Progress Notes (Signed)
Pre visit review using our clinic review tool, if applicable. No additional management support is needed unless otherwise documented below in the visit note. 

## 2015-09-25 DIAGNOSIS — H353112 Nonexudative age-related macular degeneration, right eye, intermediate dry stage: Secondary | ICD-10-CM | POA: Diagnosis not present

## 2015-09-25 DIAGNOSIS — H43813 Vitreous degeneration, bilateral: Secondary | ICD-10-CM | POA: Diagnosis not present

## 2015-10-02 ENCOUNTER — Ambulatory Visit (INDEPENDENT_AMBULATORY_CARE_PROVIDER_SITE_OTHER): Payer: Medicare Other | Admitting: Family Medicine

## 2015-10-02 DIAGNOSIS — E538 Deficiency of other specified B group vitamins: Secondary | ICD-10-CM

## 2015-10-02 MED ORDER — CYANOCOBALAMIN 1000 MCG/ML IJ SOLN
1000.0000 ug | Freq: Once | INTRAMUSCULAR | Status: AC
  Administered 2015-10-02: 1000 ug via INTRAMUSCULAR

## 2015-10-05 ENCOUNTER — Encounter: Payer: Self-pay | Admitting: Gastroenterology

## 2015-10-05 ENCOUNTER — Other Ambulatory Visit (INDEPENDENT_AMBULATORY_CARE_PROVIDER_SITE_OTHER): Payer: Medicare Other

## 2015-10-05 ENCOUNTER — Ambulatory Visit (INDEPENDENT_AMBULATORY_CARE_PROVIDER_SITE_OTHER): Payer: Medicare Other | Admitting: Gastroenterology

## 2015-10-05 VITALS — BP 150/60 | HR 56 | Ht 64.0 in | Wt 101.8 lb

## 2015-10-05 DIAGNOSIS — R197 Diarrhea, unspecified: Secondary | ICD-10-CM | POA: Diagnosis not present

## 2015-10-05 DIAGNOSIS — R109 Unspecified abdominal pain: Secondary | ICD-10-CM | POA: Diagnosis not present

## 2015-10-05 LAB — COMPREHENSIVE METABOLIC PANEL
ALT: 16 U/L (ref 0–35)
AST: 25 U/L (ref 0–37)
Albumin: 4.1 g/dL (ref 3.5–5.2)
Alkaline Phosphatase: 38 U/L — ABNORMAL LOW (ref 39–117)
BUN: 26 mg/dL — ABNORMAL HIGH (ref 6–23)
CO2: 28 mEq/L (ref 19–32)
Calcium: 9.6 mg/dL (ref 8.4–10.5)
Chloride: 107 mEq/L (ref 96–112)
Creatinine, Ser: 0.84 mg/dL (ref 0.40–1.20)
GFR: 69.18 mL/min (ref >60.00)
Glucose, Bld: 96 mg/dL (ref 70–99)
Potassium: 4.2 mEq/L (ref 3.5–5.1)
Sodium: 141 mEq/L (ref 135–145)
Total Bilirubin: 0.5 mg/dL (ref 0.2–1.2)
Total Protein: 7 g/dL (ref 6.0–8.3)

## 2015-10-05 LAB — CBC WITH DIFFERENTIAL/PLATELET
Basophils Absolute: 0 10*3/uL (ref 0.0–0.1)
Basophils Relative: 0.4 % (ref 0.0–3.0)
Eosinophils Absolute: 0.1 10*3/uL (ref 0.0–0.7)
Eosinophils Relative: 1.6 % (ref 0.0–5.0)
HCT: 42.6 % (ref 36.0–46.0)
Hemoglobin: 13.9 g/dL (ref 12.0–15.0)
Lymphocytes Relative: 15.9 % (ref 12.0–46.0)
Lymphs Abs: 0.9 10*3/uL (ref 0.7–4.0)
MCHC: 32.8 g/dL (ref 30.0–36.0)
MCV: 83 fl (ref 78.0–100.0)
Monocytes Absolute: 0.7 10*3/uL (ref 0.1–1.0)
Monocytes Relative: 12.2 % — ABNORMAL HIGH (ref 3.0–12.0)
Neutro Abs: 3.9 10*3/uL (ref 1.4–7.7)
Neutrophils Relative %: 69.9 % (ref 43.0–77.0)
Platelets: 188 10*3/uL (ref 150.0–400.0)
RBC: 5.13 Mil/uL — ABNORMAL HIGH (ref 3.87–5.11)
RDW: 15 % (ref 11.5–15.5)
WBC: 5.5 10*3/uL (ref 4.0–10.5)

## 2015-10-05 NOTE — Progress Notes (Signed)
Review of pertinent gastrointestinal problems: 1. Adenomatous polyps in colon: several colonoscopies Dr. Verl Blalock over the years. Her first was in 1993, her next one was in 2002, her next was in 2006. No polyps removed in those 3 procedures. Colonoscopy Dr. Ardis Hughs 08/2013 (for FOBT + stool); 2 polyps removed, both TAs, the largest (1.5cm rectum) contained HGD but appeared completely removed endoscopically. 2. Nausea, dyspepsia: EGD Ardis Hughs 12/2013 found mild to moderate gastritis, biopsies from stomach showed no H. Pylori, no neoplasm 3. Cholelithiasis: noted on [place] 10/2013; underwent lap chole 2017; was inflamed per patient.  HPI: This is a  very pleasant 83 year old woman whom I last saw about 2 years ago  Chief complaint is epigastric discomfort, change in bowels  Weight up 1 pound since last visit about 2 years ago.  She is having more frequent stools lately.  These occasionally have happened in the past.  In about 3 weeks.  She's had increased frequency, sensation of incomplete evacuation and some leakage.  Can have messy, soft stools. No nocturnal stooling.  She feels full early.  Nausea, dyspepsia. These are chronic issues for her.  Her dyspepsia is mainly in the epigastrium, she says it seems a bit worse lately. Complains of some early satiety however she has not lost weight on our scale here    Past Medical History  Diagnosis Date  . PVD (peripheral vascular disease) ([place])   . Anemia, pernicious   . Atrophic gastritis   . Hypothyroidism   . HLD (hyperlipidemia)   . HTN (hypertension)   . CAD (coronary artery disease)   . Carotid artery stenosis   . Median arcuate ligament syndrome ([place])   . Arthritis     bursitis   . Irritable bowel syndrome (IBS)   . Tubular adenoma of colon 08/2013    with focal high grade dysplasia  . B12 deficiency   . Gallbladder polyp   . Family history of adverse reaction to anesthesia     extreme nausea brother and sister  . Heart murmur   .  History of kidney stones   . GERD (gastroesophageal reflux disease)   . History of hiatal hernia   . Fibromyalgia     Past Surgical History  Procedure Laterality Date  . Appendectomy    . Biopsy thyroid    . Thyroid lobectomy    . Abdominal hysterectomy    . Esophageal manometry N/A 08/02/2012    Procedure: ESOPHAGEAL MANOMETRY (EM);  Surgeon: Sable Feil, MD;  Location: [facility];  Service: Endoscopy;  Laterality: N/A;  . Coronary artery bypass graft  07  . Cholecystectomy N/A 10/24/2014    Procedure: MICROSCOPIC  CHOLECYSTECTOMY;  Surgeon: Ralene Ok, MD;  Location: [facility];  Service: General;  Laterality: N/A;    Current Outpatient Prescriptions  Medication Sig Dispense Refill  . acetaminophen (TYLENOL) 500 MG tablet Take 500 mg by mouth every 6 (six) hours as needed (pain).    Marland Kitchen amLODipine (NORVASC) 10 MG tablet Take 1 tablet (10 mg total) by mouth daily. 30 tablet 11  . aspirin EC 81 MG tablet Take 81 mg by mouth at bedtime.    Marland Kitchen atorvastatin (LIPITOR) 80 MG tablet TAKE 1 TABLET BY MOUTH ONCE DAILY AT BEDTIME. 30 tablet 11  . azelastine (ASTELIN) 0.1 % nasal spray Place 1 spray into both nostrils 2 (two) times daily as needed for rhinitis or allergies. Use in each nostril as directed    . Calcium Carbonate-Vitamin D (CALTRATE 600+D PO)  Take 600 mg by mouth daily with lunch.    Marland Kitchen CARAFATE 1 GM/10ML suspension TAKE 10 MLS (1 G TOTAL) BY MOUTH 2 (TWO) TIMES DAILY. 420 mL 3  . carvedilol (COREG) 6.25 MG tablet TAKE 1/2 TABLET BY MOUTH TWICE DAILY 60 tablet 11  . Cholecalciferol (VITAMIN D) 2000 UNITS tablet Take 2,000 Units by mouth daily with lunch.    . cyanocobalamin (,VITAMIN B-12,) 1000 MCG/ML injection Inject 1,000 mcg into the muscle every 30 (thirty) days. On or about the end of each month    . fluticasone (FLONASE) 50 MCG/ACT nasal spray Place 2 sprays into the nose daily as needed. (Patient taking differently: Place 1 spray into both nostrils daily as needed for  allergies. ) 16 g 11  . levothyroxine (SYNTHROID, LEVOTHROID) 75 MCG tablet Take 75 mcg by mouth at bedtime.     Marland Kitchen losartan (COZAAR) 100 MG tablet TAKE 1 TABLET BY MOUTH DAILY. 30 tablet 11  . Multiple Vitamins-Minerals (PRESERVISION AREDS 2) CAPS Take 1 tablet by mouth daily with lunch.    Marland Kitchen OVER THE COUNTER MEDICATION Place 1 drop into both eyes 2 (two) times daily as needed (dry eyes). Preservative free over the counter eye drop for dry eyes    . saccharomyces boulardii (FLORASTOR) 250 MG capsule Take 250 mg by mouth daily with lunch.    . sodium chloride (OCEAN) 0.65 % nasal spray Place 1 spray into both nostrils 3 (three) times daily as needed for congestion.     Marland Kitchen spironolactone (ALDACTONE) 25 MG tablet TAKE 1 TABLET BY MOUTH DAILY. 30 tablet 6  . traMADol (ULTRAM) 50 MG tablet Take 50 mg by mouth every 6 (six) hours as needed (pain).      No current facility-administered medications for this visit.    Allergies as of 10/05/2015 - Review Complete 10/05/2015  Allergen Reaction Noted  . Actos [pioglitazone] Swelling 10/06/2013  . Sulfonamide derivatives Swelling 01/08/2007  . Codeine Nausea And Vomiting 01/08/2007    Family History  Problem Relation Age of Onset  . Heart disease Mother   . Prostate cancer Brother   . Breast cancer Other     niece - breast cancer  . Diabetes Sister   . Lung cancer Father   . Lung cancer Sister   . Colon cancer Neg Hx   . Esophageal cancer Neg Hx   . Stomach cancer Cousin   . Cholelithiasis Mother     Social History   Social History  . Marital Status: Married    Spouse Name: N/A  . Number of Children: N/A  . Years of Education: 12   Occupational History  . retired   .     Social History Main Topics  . Smoking status: Never Smoker   . Smokeless tobacco: Never Used  . Alcohol Use: No  . Drug Use: No  . Sexual Activity: Not on file   Other Topics Concern  . Not on file   Social History Narrative   [name], Married. Lives with  husband. Had been a very active woman.     Physical Exam: BP 150/60 mmHg  Pulse 56  Ht 5\' 4"  (1.626 m)  Wt 101 lb 12.8 oz (46.176 kg)  BMI 17.47 kg/m2 Constitutional: generally well-appearing Psychiatric: alert and oriented x3 Abdomen: soft, nontender, nondistended, no obvious ascites, no peritoneal signs, normal bowel sounds ; question subtle firm mass in her epigastrium   Assessment and plan: 82 y.o. female with epigastric discomfort, change in bowels, question  mass in abdomen on examination  I did not mention to her that I thought there might be a mass in her abdomen. I would like to wait to see if CAT scan confirms this finding or not. She does have epigastric discomfort that is a bit worse than usual for her. She is not losing weight fortunately. She has noticed a change in bowels and had a 1.5 cm polyp in her rectum removed that had some focal high-grade dysplasia 2 years ago. If CAT scan is not helpful or lab tests are not helpful for explaining her symptoms then I think she would likely likely need repeat colonoscopy.   Owens Loffler, MD [facility] 10/05/2015, 9:11 AM

## 2015-10-05 NOTE — Patient Instructions (Addendum)
You will have labs checked today in the basement lab.  Please head down after you leave here (cmet, cbc).  You have been scheduled for a CT scan of the abdomen and pelvis at [facility] ([address]---this is in the same building as [facility]).   You are scheduled on Wednesdsay 10/10/15 at 11:30 am. You should arrive 15 minutes prior to your appointment time for registration. Please follow the written instructions below on the day of your exam:  WARNING: IF YOU ARE ALLERGIC TO IODINE/X-RAY DYE, PLEASE NOTIFY RADIOLOGY IMMEDIATELY AT [phone]! YOU WILL BE GIVEN A 13 HOUR PREMEDICATION PREP.  1) Do not eat or drink anything after 7:30 am (4 hours prior to your test) 2) You have been given 2 bottles of oral contrast to drink. The solution may taste better if refrigerated, but do NOT add ice or any other liquid to this solution. Shake well before drinking.    Drink 1 bottle of contrast @ 9:30 am (2 hours prior to your exam)  Drink 1 bottle of contrast @ 10:30 am (1 hour prior to your exam)  You may take any medications as prescribed with a small amount of water except for the following: Metformin, Glucophage, Glucovance, Avandamet, Riomet, Fortamet, Actoplus Met, Janumet, Glumetza or Metaglip. The above medications must be held the day of the exam AND 48 hours after the exam.  The purpose of you drinking the oral contrast is to aid in the visualization of your intestinal tract. The contrast solution may cause some diarrhea. Before your exam is started, you will be given a small amount of fluid to drink. Depending on your individual set of symptoms, you may also receive an intravenous injection of x-ray contrast/dye. Plan on being at [facility] for 30 minutes or longer, depending on the type of exam you are having performed.  This test typically takes 30-45 minutes to complete.  If you have any questions regarding your exam or if you need to reschedule, you may  call the CT department at [phone] between the hours of 8:00 am and 5:00 pm, Monday-Friday.  ________________________________________________________________________  Depending on the CT results, you may need repeat colonoscopy.  Please start taking citrucel (orange flavored) powder fiber supplement.  This may cause some bloating at first but that usually goes away. Begin with a small spoonful and work your way up to a large, heaping spoonful daily over a week.

## 2015-10-10 ENCOUNTER — Ambulatory Visit (INDEPENDENT_AMBULATORY_CARE_PROVIDER_SITE_OTHER)
Admission: RE | Admit: 2015-10-10 | Discharge: 2015-10-10 | Disposition: A | Discharge status: Home / Self Care | Payer: Medicare Other | Source: Ambulatory Visit | Attending: Gastroenterology | Admitting: Gastroenterology

## 2015-10-10 DIAGNOSIS — N200 Calculus of kidney: Secondary | ICD-10-CM | POA: Diagnosis not present

## 2015-10-10 DIAGNOSIS — R109 Unspecified abdominal pain: Secondary | ICD-10-CM

## 2015-10-10 DIAGNOSIS — R197 Diarrhea, unspecified: Secondary | ICD-10-CM | POA: Diagnosis not present

## 2015-10-10 MED ORDER — IOPAMIDOL (ISOVUE-300) INJECTION 61%
100.0000 mL | Freq: Once | INTRAVENOUS | Status: AC | PRN
  Administered 2015-10-10: 80 mL via INTRAVENOUS

## 2015-10-12 ENCOUNTER — Telehealth: Payer: Self-pay | Admitting: Gastroenterology

## 2015-10-12 NOTE — Telephone Encounter (Signed)
Dr Ardis Hughs have you reviewed this CT?

## 2015-11-02 ENCOUNTER — Ambulatory Visit (INDEPENDENT_AMBULATORY_CARE_PROVIDER_SITE_OTHER): Payer: Medicare Other | Admitting: Family Medicine

## 2015-11-02 DIAGNOSIS — M810 Age-related osteoporosis without current pathological fracture: Secondary | ICD-10-CM | POA: Diagnosis not present

## 2015-11-02 DIAGNOSIS — Z8639 Personal history of other endocrine, nutritional and metabolic disease: Secondary | ICD-10-CM | POA: Diagnosis not present

## 2015-11-02 DIAGNOSIS — Z862 Personal history of diseases of the blood and blood-forming organs and certain disorders involving the immune mechanism: Secondary | ICD-10-CM | POA: Diagnosis not present

## 2015-11-02 DIAGNOSIS — R7303 Prediabetes: Secondary | ICD-10-CM | POA: Diagnosis not present

## 2015-11-02 MED ORDER — CYANOCOBALAMIN 1000 MCG/ML IJ SOLN
1000.0000 ug | INTRAMUSCULAR | Status: DC
  Administered 2015-11-02: 1000 ug via INTRAMUSCULAR

## 2015-11-06 ENCOUNTER — Ambulatory Visit: Payer: Medicare Other | Admitting: Family Medicine

## 2015-11-12 ENCOUNTER — Ambulatory Visit (INDEPENDENT_AMBULATORY_CARE_PROVIDER_SITE_OTHER): Payer: Medicare Other | Admitting: Family Medicine

## 2015-11-12 ENCOUNTER — Encounter: Payer: Self-pay | Admitting: Family Medicine

## 2015-11-12 ENCOUNTER — Encounter: Payer: Self-pay | Admitting: Gastroenterology

## 2015-11-12 VITALS — BP 140/70 | HR 64 | Temp 98.2°F | Ht 64.0 in | Wt 101.7 lb

## 2015-11-12 DIAGNOSIS — R600 Localized edema: Secondary | ICD-10-CM

## 2015-11-12 NOTE — Patient Instructions (Signed)
Schedule follow up with your doctor in 2-4 weeks  Elevate legs for 30 minutes twice daily  Compression sock during the day

## 2015-11-12 NOTE — Progress Notes (Signed)
HPI:  SEASON GOLDTOOTH is a very sweet 82 year old here for an acute visit for LE edema. Reports history of such and on fluid pill and compression socks in the past. Reports better in recent years then noticed some swelling in R ankle last few days and elevated legs last night with improvement today. Denies injury to foot or ankle, leg pain, fevers, malaise, CP, SOB, DOE or orthopnea.  ROS: See pertinent positives and negatives per HPI.  Past Medical History  Diagnosis Date  . PVD (peripheral vascular disease) ([place])   . Anemia, pernicious   . Atrophic gastritis   . Hypothyroidism   . HLD (hyperlipidemia)   . HTN (hypertension)   . CAD (coronary artery disease)   . Carotid artery stenosis   . Median arcuate ligament syndrome ([place])   . Arthritis     bursitis   . Irritable bowel syndrome (IBS)   . Tubular adenoma of colon 08/2013    with focal high grade dysplasia  . B12 deficiency   . Gallbladder polyp   . Family history of adverse reaction to anesthesia     extreme nausea brother and sister  . Heart murmur   . History of kidney stones   . GERD (gastroesophageal reflux disease)   . History of hiatal hernia   . Fibromyalgia     Past Surgical History  Procedure Laterality Date  . Appendectomy    . Biopsy thyroid    . Thyroid lobectomy    . Abdominal hysterectomy    . Esophageal manometry N/A 08/02/2012    Procedure: ESOPHAGEAL MANOMETRY (EM);  Surgeon: Sable Feil, MD;  Location: [facility];  Service: Endoscopy;  Laterality: N/A;  . Coronary artery bypass graft  07  . Cholecystectomy N/A 10/24/2014    Procedure: MICROSCOPIC  CHOLECYSTECTOMY;  Surgeon: Ralene Ok, MD;  Location: [facility] OR;  Service: General;  Laterality: N/A;    Family History  Problem Relation Age of Onset  . Heart disease Mother   . Prostate cancer Brother   . Breast cancer Other     niece - breast cancer  . Diabetes Sister   . Lung cancer Father   . Lung cancer Sister   . Colon cancer Neg  Hx   . Esophageal cancer Neg Hx   . Stomach cancer Cousin   . Cholelithiasis Mother     Social History   Social History  . Marital Status: Married    Spouse Name: N/A  . Number of Children: N/A  . Years of Education: 12   Occupational History  . retired   .     Social History Main Topics  . Smoking status: Never Smoker   . Smokeless tobacco: Never Used  . Alcohol Use: No  . Drug Use: No  . Sexual Activity: Not Asked   Other Topics Concern  . None   Social History Narrative   [name], Married. Lives with husband. Had been a very active woman.     Current outpatient prescriptions:  .  acetaminophen (TYLENOL) 500 MG tablet, Take 500 mg by mouth every 6 (six) hours as needed (pain)., Disp: , Rfl:  .  amLODipine (NORVASC) 10 MG tablet, Take 1 tablet (10 mg total) by mouth daily., Disp: 30 tablet, Rfl: 11 .  aspirin EC 81 MG tablet, Take 81 mg by mouth at bedtime., Disp: , Rfl:  .  atorvastatin (LIPITOR) 80 MG tablet, TAKE 1 TABLET BY MOUTH ONCE DAILY AT BEDTIME., Disp: 30 tablet,  Rfl: 11 .  azelastine (ASTELIN) 0.1 % nasal spray, Place 1 spray into both nostrils 2 (two) times daily as needed for rhinitis or allergies. Use in each nostril as directed, Disp: , Rfl:  .  Calcium Carbonate-Vitamin D (CALTRATE 600+D PO), Take 600 mg by mouth daily with lunch., Disp: , Rfl:  .  CARAFATE 1 GM/10ML suspension, TAKE 10 MLS (1 G TOTAL) BY MOUTH 2 (TWO) TIMES DAILY., Disp: 420 mL, Rfl: 3 .  carvedilol (COREG) 6.25 MG tablet, TAKE 1/2 TABLET BY MOUTH TWICE DAILY, Disp: 60 tablet, Rfl: 11 .  Cholecalciferol (VITAMIN D) 2000 UNITS tablet, Take 2,000 Units by mouth daily with lunch., Disp: , Rfl:  .  cyanocobalamin (,VITAMIN B-12,) 1000 MCG/ML injection, Inject 1,000 mcg into the muscle every 30 (thirty) days. On or about the end of each month, Disp: , Rfl:  .  fluticasone (FLONASE) 50 MCG/ACT nasal spray, Place 2 sprays into the nose daily as needed. (Patient taking differently: Place 1 spray  into both nostrils daily as needed for allergies. ), Disp: 16 g, Rfl: 11 .  levothyroxine (SYNTHROID, LEVOTHROID) 75 MCG tablet, Take 75 mcg by mouth at bedtime. , Disp: , Rfl:  .  losartan (COZAAR) 100 MG tablet, TAKE 1 TABLET BY MOUTH DAILY., Disp: 30 tablet, Rfl: 11 .  Multiple Vitamins-Minerals (PRESERVISION AREDS 2) CAPS, Take 1 tablet by mouth daily with lunch., Disp: , Rfl:  .  OVER THE COUNTER MEDICATION, Place 1 drop into both eyes 2 (two) times daily as needed (dry eyes). Preservative free over the counter eye drop for dry eyes, Disp: , Rfl:  .  saccharomyces boulardii (FLORASTOR) 250 MG capsule, Take 250 mg by mouth daily with lunch., Disp: , Rfl:  .  sodium chloride (OCEAN) 0.65 % nasal spray, Place 1 spray into both nostrils 3 (three) times daily as needed for congestion. , Disp: , Rfl:  .  spironolactone (ALDACTONE) 25 MG tablet, TAKE 1 TABLET BY MOUTH DAILY., Disp: 30 tablet, Rfl: 6 .  traMADol (ULTRAM) 50 MG tablet, Take 50 mg by mouth every 6 (six) hours as needed (pain). , Disp: , Rfl:   EXAM:  Filed Vitals:   11/12/15 1626  BP: 140/70  Pulse: 64  Temp: 98.2 F (36.8 C)    Body mass index is 17.45 kg/(m^2).  GENERAL: vitals reviewed and listed above, alert, oriented, appears well hydrated and in no acute distress  HEENT: atraumatic, conjunttiva clear, no obvious abnormalities on inspection of external nose and ears  NECK: no obvious masses on inspection  LUNGS: clear to auscultation bilaterally, no wheezes, rales or rhonchi, good air movement  CV: HRRR, tr bilat LE edema to lower 1/3 calves, a little worse in the R ankle, no sig bony or soft tissue TTP in the foot or ankle, no pain on palpation area of deep veins, some mild venous stasis dermatitis bilat  MS: moves all extremities without noticeable abnormality  PSYCH: pleasant and cooperative, no obvious depression or anxiety  ASSESSMENT AND PLAN:  Discussed the following assessment and plan:  Bilateral  edema of lower extremity  -hx vein harvesting R per pt - suspect this is why she has mildly R>L peripheral edema, suspect venous insufficiency -mild findings today and opted for elevation and compression -she is anxious, will have her follow up with PCP in 2-4 weeks  -Patient advised to return or notify a doctor immediately if symptoms worsen or persist or new concerns arise.  Patient Instructions  Schedule follow up  with your doctor in 2-4 weeks  Elevate legs for 30 minutes twice daily  Compression sock during the day    Colin Benton R., DO

## 2015-11-12 NOTE — Progress Notes (Signed)
Pre visit review using our clinic review tool, if applicable. No additional management support is needed unless otherwise documented below in the visit note. 

## 2015-11-13 DIAGNOSIS — M810 Age-related osteoporosis without current pathological fracture: Secondary | ICD-10-CM | POA: Diagnosis not present

## 2015-11-13 DIAGNOSIS — R7303 Prediabetes: Secondary | ICD-10-CM | POA: Diagnosis not present

## 2015-11-13 DIAGNOSIS — Z8639 Personal history of other endocrine, nutritional and metabolic disease: Secondary | ICD-10-CM | POA: Diagnosis not present

## 2015-11-20 ENCOUNTER — Other Ambulatory Visit: Payer: Self-pay | Admitting: Cardiovascular Disease

## 2015-11-20 DIAGNOSIS — I6523 Occlusion and stenosis of bilateral carotid arteries: Secondary | ICD-10-CM

## 2015-11-28 ENCOUNTER — Encounter: Payer: Medicare Other | Admitting: Gastroenterology

## 2015-11-28 ENCOUNTER — Ambulatory Visit (HOSPITAL_COMMUNITY)
Admission: RE | Admit: 2015-11-28 | Discharge: 2015-11-28 | Disposition: A | Discharge status: Home / Self Care | Payer: Medicare Other | Source: Ambulatory Visit | Attending: Cardiovascular Disease | Admitting: Cardiovascular Disease

## 2015-11-28 DIAGNOSIS — I2510 Atherosclerotic heart disease of native coronary artery without angina pectoris: Secondary | ICD-10-CM | POA: Insufficient documentation

## 2015-11-28 DIAGNOSIS — K219 Gastro-esophageal reflux disease without esophagitis: Secondary | ICD-10-CM | POA: Insufficient documentation

## 2015-11-28 DIAGNOSIS — E039 Hypothyroidism, unspecified: Secondary | ICD-10-CM | POA: Insufficient documentation

## 2015-11-28 DIAGNOSIS — I10 Essential (primary) hypertension: Secondary | ICD-10-CM | POA: Diagnosis not present

## 2015-11-28 DIAGNOSIS — E785 Hyperlipidemia, unspecified: Secondary | ICD-10-CM | POA: Diagnosis not present

## 2015-11-28 DIAGNOSIS — I6523 Occlusion and stenosis of bilateral carotid arteries: Secondary | ICD-10-CM | POA: Insufficient documentation

## 2015-12-05 ENCOUNTER — Ambulatory Visit (INDEPENDENT_AMBULATORY_CARE_PROVIDER_SITE_OTHER): Payer: Medicare Other | Admitting: Family Medicine

## 2015-12-05 ENCOUNTER — Ambulatory Visit: Payer: Medicare Other | Admitting: Family Medicine

## 2015-12-05 VITALS — BP 120/60 | HR 63 | Temp 97.6°F | Ht 64.0 in | Wt 100.8 lb

## 2015-12-05 DIAGNOSIS — L603 Nail dystrophy: Secondary | ICD-10-CM | POA: Diagnosis not present

## 2015-12-05 DIAGNOSIS — R600 Localized edema: Secondary | ICD-10-CM | POA: Diagnosis not present

## 2015-12-05 DIAGNOSIS — E538 Deficiency of other specified B group vitamins: Secondary | ICD-10-CM

## 2015-12-05 DIAGNOSIS — I10 Essential (primary) hypertension: Secondary | ICD-10-CM | POA: Diagnosis not present

## 2015-12-05 MED ORDER — CYANOCOBALAMIN 1000 MCG/ML IJ SOLN
1000.0000 ug | Freq: Once | INTRAMUSCULAR | Status: AC
  Administered 2015-12-05: 1000 ug via INTRAMUSCULAR

## 2015-12-05 NOTE — Patient Instructions (Signed)

## 2015-12-05 NOTE — Progress Notes (Signed)
Subjective:     Patient ID: [name], female   DOB: [birth year], 82 y.o.   MRN: [id number]  HPI Follow-up regarding bilateral leg edema. Suspected venous stasis. Weight is stable.  On amlodipine 10 mg for hypertension. Denies any recent orthopnea or dyspnea with exertion. Hx of CAD but no CHF. No recent chest pain. BP has been stable.  Recently saw her endocrinologist and thyroid over replaced and Synthroid dosage reduced to 50 g. Appetite weight are stable.  Dystrophic nails. Changing colors. Occasional soreness around the nail base of both great toes.  No drainage or foul odor. Denies any recent injury  Past Medical History:  Diagnosis Date  . Anemia, pernicious   . Arthritis    bursitis   . Atrophic gastritis   . B12 deficiency   . CAD (coronary artery disease)   . Carotid artery stenosis   . Family history of adverse reaction to anesthesia    extreme nausea brother and sister  . Fibromyalgia   . Gallbladder polyp   . GERD (gastroesophageal reflux disease)   . Heart murmur   . History of hiatal hernia   . History of kidney stones   . HLD (hyperlipidemia)   . HTN (hypertension)   . Hypothyroidism   . Irritable bowel syndrome (IBS)   . Median arcuate ligament syndrome ([place])   . PVD (peripheral vascular disease) ([place])   . Tubular adenoma of colon 08/2013   with focal high grade dysplasia   Past Surgical History:  Procedure Laterality Date  . ABDOMINAL HYSTERECTOMY    . APPENDECTOMY    . BIOPSY THYROID    . CHOLECYSTECTOMY N/A 10/24/2014   Procedure: MICROSCOPIC  CHOLECYSTECTOMY;  Surgeon: Ralene Ok, MD;  Location: [facility];  Service: General;  Laterality: N/A;  . CORONARY ARTERY BYPASS GRAFT  07  . ESOPHAGEAL MANOMETRY N/A 08/02/2012   Procedure: ESOPHAGEAL MANOMETRY (EM);  Surgeon: Sable Feil, MD;  Location: [facility];  Service: Endoscopy;  Laterality: N/A;  . THYROID LOBECTOMY      reports that she has never smoked. She has never used smokeless  tobacco. She reports that she does not drink alcohol or use drugs. family history includes Breast cancer in her other; Cholelithiasis in her mother; Diabetes in her sister; Heart disease in her mother; Lung cancer in her father and sister; Prostate cancer in her brother; Stomach cancer in her cousin. Allergies  Allergen Reactions  . Actos [Pioglitazone] Swelling    Swelling  . Sulfonamide Derivatives Swelling  . Codeine Nausea And Vomiting  ]    Review of Systems  Constitutional: Positive for fatigue.  Eyes: Negative for visual disturbance.  Respiratory: Negative for cough, chest tightness, shortness of breath and wheezing.   Cardiovascular: Positive for leg swelling. Negative for chest pain and palpitations.  Neurological: Negative for dizziness, seizures, syncope, weakness, light-headedness and headaches.       Objective:   Physical Exam  Constitutional: She appears well-developed and well-nourished.  Eyes: Pupils are equal, round, and reactive to light.  Neck: Neck supple. No JVD present. No thyromegaly present.  Cardiovascular: Normal rate and regular rhythm.  Exam reveals no gallop.   Pulmonary/Chest: Effort normal and breath sounds normal. No respiratory distress. She has no wheezes. She has no rales.  Musculoskeletal: She exhibits edema.  Only very trace edema ankles and dorsum of both feet  Neurological: She is alert.       Assessment:     #1 bilateral leg edema. Stable. Suspected  venous stasis and related medications-amlodipine  #2 dystrophic toenails. Question onycholysis more likely than onychomycosis.  #3 hypertension stable  #4 B12 deficiency    Plan:     -Continue frequent elevation and compression -Prefer not to add additional diuretics at this time -Patient given name of local podiatrist to consult with regarding her dystrophic toenails -B12 injection given.    [name] MD [facility]

## 2015-12-10 ENCOUNTER — Encounter: Payer: Self-pay | Admitting: Cardiovascular Disease

## 2015-12-10 NOTE — Telephone Encounter (Signed)
NEW MESSAGE   Pt verbalized that she is calling to get her carotid test results

## 2015-12-10 NOTE — Telephone Encounter (Signed)
This encounter was created in error - please disregard.

## 2015-12-12 ENCOUNTER — Encounter: Payer: Self-pay | Admitting: Podiatry

## 2015-12-12 ENCOUNTER — Ambulatory Visit (INDEPENDENT_AMBULATORY_CARE_PROVIDER_SITE_OTHER): Payer: Medicare Other | Admitting: Podiatry

## 2015-12-12 VITALS — BP 170/60 | HR 64 | Ht 62.0 in | Wt 100.0 lb

## 2015-12-12 DIAGNOSIS — M79606 Pain in leg, unspecified: Secondary | ICD-10-CM | POA: Diagnosis not present

## 2015-12-12 DIAGNOSIS — B351 Tinea unguium: Secondary | ICD-10-CM | POA: Diagnosis not present

## 2015-12-12 NOTE — Patient Instructions (Signed)
Seen for thick dystrophic nails. All nails debrided. No other abnormal findings noted. Return in 3 months or as needed.

## 2015-12-12 NOTE — Progress Notes (Signed)
SUBJECTIVE: 82 y.o. year old female presents with proe.blematic toe nails. They are too thick and difficult to handle.  Patient was referred by Dr. Elease Hashimoto. Marland Kitchen REVIEW OF SYSTEMS: A comprehensive review of systems was negative except for: Hypertension under control, Pernicious anemia due to B12 deficiency, history of hart surgery 11 years ago and osteoporosis affecting lower back.   OBJECTIVE: DERMATOLOGIC EXAMINATION: Thick dystrophic nail devitalized right great toe at nail bed.  No open skin lesions.   VASCULAR EXAMINATION OF LOWER LIMBS: Pedal pulses: All pedal pulses are palpable with normal pulsation.  Capillary Filling times within 3 seconds in all digits.  Both feet are cold to touch.   NEUROLOGIC EXAMINATION OF THE LOWER LIMBS: Achilles DTR is present and within normal. Monofilament (Semmes-Weinstein 10-gm) sensory testing positive 6 out of 6, bilateral. Vibratory sensations(128Hz  turning fork) perception deficit noted on both feet. Sharp and Dull discriminatory sensations at the plantar ball of hallux is intact bilateral.   MUSCULOSKELETAL EXAMINATION: No gross deformities noted.   ASSESSMENT: Mycotic nails both great toe with thick devitalized detached right great toe nail.  PLAN:  Reviewed findings and available treatment options. All nails debrided. Removed detatched nail plate from right great toe. Return in 3 month for routine foot care.

## 2015-12-17 ENCOUNTER — Encounter: Payer: Self-pay | Admitting: Family Medicine

## 2015-12-18 ENCOUNTER — Encounter: Payer: Self-pay | Admitting: Gastroenterology

## 2015-12-18 ENCOUNTER — Ambulatory Visit (AMBULATORY_SURGERY_CENTER): Payer: Self-pay

## 2015-12-18 VITALS — Ht 64.0 in | Wt 101.8 lb

## 2015-12-18 DIAGNOSIS — Z800 Family history of malignant neoplasm of digestive organs: Secondary | ICD-10-CM

## 2015-12-18 DIAGNOSIS — Z86010 Personal history of colonic polyps: Secondary | ICD-10-CM

## 2015-12-18 MED ORDER — NA SULFATE-K SULFATE-MG SULF 17.5-3.13-1.6 GM/177ML PO SOLN: ORAL | 0 refills | Status: DC

## 2015-12-18 NOTE — Progress Notes (Signed)
Per pt, no allergies to soy or egg products.Pt not taking any weight loss meds or using  O2 at home. 

## 2015-12-21 ENCOUNTER — Ambulatory Visit: Payer: Medicare Other | Admitting: Family Medicine

## 2016-01-01 ENCOUNTER — Encounter: Payer: Self-pay | Admitting: Gastroenterology

## 2016-01-01 ENCOUNTER — Ambulatory Visit (AMBULATORY_SURGERY_CENTER): Payer: Medicare Other | Admitting: Gastroenterology

## 2016-01-01 VITALS — BP 124/52 | HR 51 | Temp 99.3°F | Resp 17 | Ht 64.0 in | Wt 101.0 lb

## 2016-01-01 DIAGNOSIS — D128 Benign neoplasm of rectum: Secondary | ICD-10-CM

## 2016-01-01 DIAGNOSIS — R194 Change in bowel habit: Secondary | ICD-10-CM

## 2016-01-01 DIAGNOSIS — Z860100 Personal history of colon polyps, unspecified: Secondary | ICD-10-CM

## 2016-01-01 DIAGNOSIS — K52832 Lymphocytic colitis: Secondary | ICD-10-CM

## 2016-01-01 DIAGNOSIS — D129 Benign neoplasm of anus and anal canal: Secondary | ICD-10-CM

## 2016-01-01 DIAGNOSIS — I739 Peripheral vascular disease, unspecified: Secondary | ICD-10-CM | POA: Diagnosis not present

## 2016-01-01 DIAGNOSIS — Z800 Family history of malignant neoplasm of digestive organs: Secondary | ICD-10-CM | POA: Diagnosis not present

## 2016-01-01 MED ORDER — SODIUM CHLORIDE 0.9 % IV SOLN: 500.0000 mL | INTRAVENOUS | Status: DC

## 2016-01-01 NOTE — Op Note (Signed)
[facility] Patient Name: [name] Procedure Date: 01/01/2016 10:57 AM MRN: [id number] Endoscopist: Milus Banister , MD Age: 82 Referring MD:  Date of Birth: December 09, 1932 Gender: Female Account #: [id number] Procedure:                Colonoscopy Indications:              Change in bowel habits; looser than usual,                            intermittently. also Adenomatous polyps in                            colon:several colonoscopies Dr. Verl Blalock                            over the years. Her first was in 1993, her next one                            was in 2002, her next was in 2006. No polyps                            removed in those 3 procedures. Colonoscopy Dr.                            Ardis Hughs 08/2013 (for FOBT + stool); 2 polyps removed,                            both TAs, the largest (1.5cm rectum) contained HGD                            but appeared completely removed endoscopically Medicines:                Monitored Anesthesia Care Procedure:                Pre-Anesthesia Assessment:                           - Prior to the procedure, a History and Physical                            was performed, and patient medications and                            allergies were reviewed. The patient's tolerance of                            previous anesthesia was also reviewed. The risks                            and benefits of the procedure and the sedation                            options and risks were discussed with the patient.  All questions were answered, and informed consent                            was obtained. Prior Anticoagulants: The patient has                            taken no previous anticoagulant or antiplatelet                            agents. ASA Grade Assessment: II - A patient with                            mild systemic disease. After reviewing the risks                            and benefits, the  patient was deemed in                            satisfactory condition to undergo the procedure.                           After obtaining informed consent, the colonoscope                            was passed under direct vision. Throughout the                            procedure, the patient's blood pressure, pulse, and                            oxygen saturations were monitored continuously. The                            Model CF-HQ190L [id number]) scope was introduced                            through the anus and advanced to the the terminal                            ileum. The colonoscopy was performed without                            difficulty. The patient tolerated the procedure                            well. The quality of the bowel preparation was                            good. The terminal ileum, ileocecal valve,                            appendiceal orifice, and rectum were photographed. Scope In: 11:04:09 AM Scope Out: 11:18:22 AM Scope Withdrawal Time: 0 hours 11 minutes 17 seconds  Total  Procedure Duration: 0 hours 14 minutes 13 seconds  Findings:                 The terminal ileum appeared normal.                           A 14 mm polyp was found in the rectum. The polyp                            was semi-sessile. The polyp was removed with a hot                            snare, (required piecemeal fashion). Resection and                            retrieval were complete.                           The exam was otherwise without abnormality on                            direct and retroflexion views.                           Biopsies for histology were taken with a cold                            forceps from the entire colon for evaluation of                            microscopic colitis. Complications:            No immediate complications. Estimated blood loss:                            None. Estimated Blood Loss:     Estimated blood loss:  none. Impression:               - The examined portion of the ileum was normal.                           - One 14 mm polyp in the rectum, removed with a hot                            snare. Resected and retrieved.                           - The examination was otherwise normal on direct                            and retroflexion views.                           - Biopsies were taken with a cold forceps from the  entire colon for evaluation of microscopic colitis. Recommendation:           - Patient has a contact number available for                            emergencies. The signs and symptoms of potential                            delayed complications were discussed with the                            patient. Return to normal activities tomorrow.                            Written discharge instructions were provided to the                            patient.                           - Resume previous diet.                           - Continue present medications. Please start once                            daily OTC fiber supplement (such as orange powder                            citrucel).                           - Await pathology results. Milus Banister, MD 01/01/2016 11:28:21 AM This report has been signed electronically.

## 2016-01-01 NOTE — Progress Notes (Signed)
Called to room to assist during endoscopic procedure.  Patient ID and intended procedure confirmed with present staff. Received instructions for my participation in the procedure from the performing physician.  

## 2016-01-01 NOTE — Patient Instructions (Signed)
Discharge instructions given. Handout on polyps. Resume previous medications. YOU HAD AN ENDOSCOPIC PROCEDURE TODAY AT THE [facility]:   Refer to the procedure report that was given to you for any specific questions about what was found during the examination.  If the procedure report does not answer your questions, please call your gastroenterologist to clarify.  If you requested that your care partner not be given the details of your procedure findings, then the procedure report has been included in a sealed envelope for you to review at your convenience later.  YOU SHOULD EXPECT: Some feelings of bloating in the abdomen. Passage of more gas than usual.  Walking can help get rid of the air that was put into your GI tract during the procedure and reduce the bloating. If you had a lower endoscopy (such as a colonoscopy or flexible sigmoidoscopy) you may notice spotting of blood in your stool or on the toilet paper. If you underwent a bowel prep for your procedure, you may not have a normal bowel movement for a few days.  Please Note:  You might notice some irritation and congestion in your nose or some drainage.  This is from the oxygen used during your procedure.  There is no need for concern and it should clear up in a day or so.  SYMPTOMS TO REPORT IMMEDIATELY:   Following lower endoscopy (colonoscopy or flexible sigmoidoscopy):  Excessive amounts of blood in the stool  Significant tenderness or worsening of abdominal pains  Swelling of the abdomen that is new, acute  Fever of 100F or higher   For urgent or emergent issues, a gastroenterologist can be reached at any hour by calling [phone].   DIET:  We do recommend a small meal at first, but then you may proceed to your regular diet.  Drink plenty of fluids but you should avoid alcoholic beverages for 24 hours.  ACTIVITY:  You should plan to take it easy for the rest of today and you should NOT DRIVE or use heavy  machinery until tomorrow (because of the sedation medicines used during the test).    FOLLOW UP: Our staff will call the number listed on your records the next business day following your procedure to check on you and address any questions or concerns that you may have regarding the information given to you following your procedure. If we do not reach you, we will leave a message.  However, if you are feeling well and you are not experiencing any problems, there is no need to return our call.  We will assume that you have returned to your regular daily activities without incident.  If any biopsies were taken you will be contacted by phone or by letter within the next 1-3 weeks.  Please call us at [phone] if you have not heard about the biopsies in 3 weeks.    SIGNATURES/CONFIDENTIALITY: You and/or your care partner have signed paperwork which will be entered into your electronic medical record.  These signatures attest to the fact that that the information above on your After Visit Summary has been reviewed and is understood.  Full responsibility of the confidentiality of this discharge information lies with you and/or your care-partner. 

## 2016-01-01 NOTE — Progress Notes (Signed)
Report to PACU, RN, vss, BBS= Clear.  

## 2016-01-02 ENCOUNTER — Telehealth: Payer: Self-pay | Admitting: *Deleted

## 2016-01-02 NOTE — Telephone Encounter (Signed)
  Follow up Call-  Call back number 01/01/2016 11/30/2013 08/31/2013  Post procedure Call Back phone  # [phone]  Permission to leave phone message Yes Yes Yes  Some recent data might be hidden     Patient questions:  Do you have a fever, pain , or abdominal swelling? No. Pain Score  0 *  Have you tolerated food without any problems? Yes.    Have you been able to return to your normal activities? Yes.    Do you have any questions about your discharge instructions: Diet   No. Medications  No. Follow up visit  No.  Do you have questions or concerns about your Care? No.  Actions: * If pain score is 4 or above: No action needed, pain <4.

## 2016-01-07 ENCOUNTER — Ambulatory Visit: Payer: Medicare Other | Admitting: Family Medicine

## 2016-01-08 ENCOUNTER — Ambulatory Visit: Payer: Medicare Other | Admitting: Family Medicine

## 2016-01-08 ENCOUNTER — Other Ambulatory Visit: Payer: Self-pay | Admitting: Family Medicine

## 2016-01-10 ENCOUNTER — Other Ambulatory Visit: Payer: Self-pay

## 2016-01-10 MED ORDER — BUDESONIDE 3 MG PO CPEP: 9.0000 mg | ORAL_CAPSULE | Freq: Every day | ORAL | 3 refills | Status: DC

## 2016-01-11 ENCOUNTER — Ambulatory Visit (INDEPENDENT_AMBULATORY_CARE_PROVIDER_SITE_OTHER): Payer: Medicare Other | Admitting: Family Medicine

## 2016-01-11 DIAGNOSIS — E538 Deficiency of other specified B group vitamins: Secondary | ICD-10-CM

## 2016-01-11 DIAGNOSIS — D510 Vitamin B12 deficiency anemia due to intrinsic factor deficiency: Secondary | ICD-10-CM

## 2016-01-11 LAB — HM COLONOSCOPY

## 2016-01-11 MED ORDER — CYANOCOBALAMIN 1000 MCG/ML IJ SOLN
1000.0000 ug | Freq: Once | INTRAMUSCULAR | Status: AC
  Administered 2016-01-11: 1000 ug via INTRAMUSCULAR

## 2016-01-14 DIAGNOSIS — Z1231 Encounter for screening mammogram for malignant neoplasm of breast: Secondary | ICD-10-CM | POA: Diagnosis not present

## 2016-01-14 DIAGNOSIS — Z8639 Personal history of other endocrine, nutritional and metabolic disease: Secondary | ICD-10-CM | POA: Diagnosis not present

## 2016-01-14 LAB — HM MAMMOGRAPHY: HM Mammogram: NORMAL (ref 0–4)

## 2016-01-18 ENCOUNTER — Encounter: Payer: Self-pay | Admitting: Family Medicine

## 2016-01-21 DIAGNOSIS — N959 Unspecified menopausal and perimenopausal disorder: Secondary | ICD-10-CM | POA: Diagnosis not present

## 2016-01-23 ENCOUNTER — Encounter: Payer: Self-pay | Admitting: Family Medicine

## 2016-02-02 ENCOUNTER — Other Ambulatory Visit: Payer: Self-pay | Admitting: Gastroenterology

## 2016-02-06 ENCOUNTER — Ambulatory Visit: Payer: Medicare Other | Admitting: Family Medicine

## 2016-02-11 ENCOUNTER — Ambulatory Visit (INDEPENDENT_AMBULATORY_CARE_PROVIDER_SITE_OTHER): Payer: Medicare Other | Admitting: Family Medicine

## 2016-02-11 DIAGNOSIS — E538 Deficiency of other specified B group vitamins: Secondary | ICD-10-CM | POA: Diagnosis not present

## 2016-02-11 DIAGNOSIS — D509 Iron deficiency anemia, unspecified: Secondary | ICD-10-CM

## 2016-02-11 MED ORDER — CYANOCOBALAMIN 1000 MCG/ML IJ SOLN
1000.0000 ug | Freq: Once | INTRAMUSCULAR | Status: AC
  Administered 2016-02-11: 1000 ug via INTRAMUSCULAR

## 2016-02-18 DIAGNOSIS — R7303 Prediabetes: Secondary | ICD-10-CM | POA: Diagnosis not present

## 2016-02-22 DIAGNOSIS — Z23 Encounter for immunization: Secondary | ICD-10-CM | POA: Diagnosis not present

## 2016-03-13 ENCOUNTER — Encounter: Payer: Self-pay | Admitting: Cardiovascular Disease

## 2016-03-13 ENCOUNTER — Ambulatory Visit (INDEPENDENT_AMBULATORY_CARE_PROVIDER_SITE_OTHER): Payer: Medicare Other | Admitting: Cardiovascular Disease

## 2016-03-13 VITALS — BP 150/74 | HR 70 | Ht 64.0 in | Wt 102.1 lb

## 2016-03-13 DIAGNOSIS — I6523 Occlusion and stenosis of bilateral carotid arteries: Secondary | ICD-10-CM | POA: Diagnosis not present

## 2016-03-13 DIAGNOSIS — I10 Essential (primary) hypertension: Secondary | ICD-10-CM

## 2016-03-13 DIAGNOSIS — R062 Wheezing: Secondary | ICD-10-CM | POA: Diagnosis not present

## 2016-03-13 DIAGNOSIS — J301 Allergic rhinitis due to pollen: Secondary | ICD-10-CM | POA: Diagnosis not present

## 2016-03-13 DIAGNOSIS — K219 Gastro-esophageal reflux disease without esophagitis: Secondary | ICD-10-CM | POA: Diagnosis not present

## 2016-03-13 NOTE — Patient Instructions (Signed)
Medication Instructions:  Your physician recommends that you continue on your current medications as directed. Please refer to the Current Medication list given to you today.  Labwork: No new orders.   Testing/Procedures: Your physician has requested that you have a carotid duplex in August 2018. This test is an ultrasound of the carotid arteries in your neck. It looks at blood flow through these arteries that supply the brain with blood. Allow one hour for this exam. There are no restrictions or special instructions.  Follow-Up: Your physician wants you to follow-up in: 1 YEAR with Dr Burt Knack.  You will receive a reminder letter in the mail two months in advance. If you don't receive a letter, please call our office to schedule the follow-up appointment.  Any Other Special Instructions Will Be Listed Below (If Applicable).     If you need a refill on your cardiac medications before your next appointment, please call your pharmacy.

## 2016-03-13 NOTE — Progress Notes (Signed)
Cardiology Office Note Date:  03/13/2016   ID:  [name], DOB 04-20-1933, MRN [id number]  PCP:  [name], MD  Cardiologist:  Sherren Mocha, MD    Chief Complaint  Patient presents with  . Coronary Artery Disease   History of Present Illness: [name] is a 82 y.o. female who presents for follow-up of coronary artery disease and mitral regurgitation. The patient has had previous CABG in 2007. Been followed for asymptomatic carotid stenosis. She's been noted to have moderate mitral regurgitation.  She's been doing well until the past week. States she has a sinus infection and is just starting on antibiotics. Complaining of headache, congestion, drainage, and cough.   From a cardiac perspective she is doing pretty well. She can climb up and down stairs without shortness of breath or chest discomfort. She's had some mild swelling in the right leg. No orthopnea or PND. No heart palpitations.  Past Medical History:  Diagnosis Date  . Anemia, pernicious   . Arthritis    bursitis   . Atrophic gastritis   . B12 deficiency   . CAD (coronary artery disease)   . Carotid artery stenosis   . Diarrhea    on and off for months  . Family history of adverse reaction to anesthesia    extreme nausea brother and sister  . Fibromyalgia   . Gallbladder polyp   . GERD (gastroesophageal reflux disease)   . Heart murmur   . History of hiatal hernia   . History of kidney stones   . HLD (hyperlipidemia)   . HTN (hypertension)   . Hypothyroidism   . Irritable bowel syndrome (IBS)   . Median arcuate ligament syndrome ([place])   . PVD (peripheral vascular disease) ([place])   . Tubular adenoma of colon 08/2013   with focal high grade dysplasia    Past Surgical History:  Procedure Laterality Date  . ABDOMINAL HYSTERECTOMY    . APPENDECTOMY    . BIOPSY THYROID    . CHOLECYSTECTOMY N/A 10/24/2014   Procedure: MICROSCOPIC  CHOLECYSTECTOMY;  Surgeon: Ralene Ok, MD;   Location: [facility];  Service: General;  Laterality: N/A;  . CORONARY ARTERY BYPASS GRAFT  07  . ESOPHAGEAL MANOMETRY N/A 08/02/2012   Procedure: ESOPHAGEAL MANOMETRY (EM);  Surgeon: Sable Feil, MD;  Location: [facility];  Service: Endoscopy;  Laterality: N/A;  . THYROID LOBECTOMY      Current Outpatient Prescriptions  Medication Sig Dispense Refill  . acetaminophen (TYLENOL) 500 MG tablet Take 500 mg by mouth every 6 (six) hours as needed (pain).    . AFLURIA QUADRIVALENT injection Inject as directed once.    Marland Kitchen amLODipine (NORVASC) 10 MG tablet Take 1 tablet (10 mg total) by mouth daily. 30 tablet 11  . aspirin EC 81 MG tablet Take 81 mg by mouth at bedtime.    Marland Kitchen atorvastatin (LIPITOR) 80 MG tablet TAKE 1 TABLET BY MOUTH ONCE DAILY AT BEDTIME. 30 tablet 11  . azelastine (ASTELIN) 0.1 % nasal spray Place 1 spray into both nostrils 2 (two) times daily as needed for rhinitis or allergies. Use in each nostril as directed    . budesonide (ENTOCORT EC) 3 MG 24 hr capsule Take 3 capsules (9 mg total) by mouth daily. 90 capsule 3  . Calcium Carbonate-Vitamin D (CALTRATE 600+D PO) Take 600 mg by mouth daily with lunch.    Marland Kitchen CARAFATE 1 GM/10ML suspension TAKE 10 MLS (1 G TOTAL) BY MOUTH 2 (TWO) TIMES DAILY.  420 mL 1  . carvedilol (COREG) 6.25 MG tablet TAKE 1/2 TABLET BY MOUTH TWICE DAILY 60 tablet 11  . Cholecalciferol (VITAMIN D) 2000 UNITS tablet Take 2,000 Units by mouth daily with lunch.    . cyanocobalamin (,VITAMIN B-12,) 1000 MCG/ML injection Inject 1,000 mcg into the muscle every 30 (thirty) days. On or about the end of each month    . fluticasone (FLONASE) 50 MCG/ACT nasal spray Place 2 sprays into the nose daily as needed. (Patient taking differently: Place 1 spray into both nostrils daily as needed for allergies. ) 16 g 11  . levothyroxine (SYNTHROID, LEVOTHROID) 50 MCG tablet Take 50 mcg by mouth daily before breakfast.    . losartan (COZAAR) 100 MG tablet TAKE 1 TABLET BY MOUTH DAILY.  30 tablet 11  . Methylcellulose, Laxative, (CITRUCEL PO) Take by mouth as needed.    . Multiple Vitamins-Minerals (PRESERVISION AREDS 2) CAPS Take 1 tablet by mouth daily with lunch.    Marland Kitchen OVER THE COUNTER MEDICATION Place 1 drop into both eyes 2 (two) times daily as needed (dry eyes). Preservative free over the counter eye drop for dry eyes    . saccharomyces boulardii (FLORASTOR) 250 MG capsule Take 250 mg by mouth daily with lunch.    . sodium chloride (OCEAN) 0.65 % nasal spray Place 1 spray into both nostrils 3 (three) times daily as needed for congestion.     Marland Kitchen spironolactone (ALDACTONE) 25 MG tablet TAKE 1 TABLET BY MOUTH ONCE DAILY. 30 tablet 11  . traMADol (ULTRAM) 50 MG tablet Take 50 mg by mouth every 6 (six) hours as needed (pain).      Current Facility-Administered Medications  Medication Dose Route Frequency Provider Last Rate Last Dose  . 0.9 %  sodium chloride infusion  500 mL Intravenous Continuous Milus Banister, MD        Allergies:   Actos [pioglitazone]; Sulfonamide derivatives; and Codeine   Social History:  The patient  reports that she has never smoked. She has never used smokeless tobacco. She reports that she does not drink alcohol or use drugs.   Family History:  The patient's family history includes Breast cancer in her other; Cholelithiasis in her mother; Colon cancer in her brother; Diabetes in her sister; Heart disease in her mother; Lung cancer in her father and sister; Prostate cancer in her brother; Stomach cancer in her cousin.    ROS:  Please see the history of present illness.  All other systems are reviewed and negative.    PHYSICAL EXAM: VS:  BP (!) 150/74 (BP Location: Left Arm, Patient Position: Sitting, Cuff Size: Normal)   Pulse 70   Ht 5\' 4"  (1.626 m)   Wt 102 lb 1.9 oz (46.3 kg)   BMI 17.53 kg/m  , BMI Body mass index is 17.53 kg/m. GEN: Thin, elderly woman in no acute distress  BP on my check 138/60 mmHg HEENT: normal  Neck: no JVD, no  masses. No carotid bruits Cardiac: RRR with 3/6 systolic murmur at the apex, unchanged from prior exams               Respiratory:  clear to auscultation bilaterally, normal work of breathing GI: soft, nontender, nondistended, + BS MS: no deformity or atrophy  Ext: 1+ pretibial edema on the right, none on the left, pedal pulses 2+= bilaterally Skin: warm and dry, no rash Neuro:  Strength and sensation are intact Psych: euthymic mood, full affect  EKG:  EKG is ordered  today. The ekg ordered today shows normal sinus rhythm 70 bpm, nonspecific intraventricular conduction. No significant change from previous.  Recent Labs: 10/05/2015: ALT 16; BUN 26; Creatinine, Ser 0.84; Hemoglobin 13.9; Platelets 188.0; Potassium 4.2; Sodium 141   Lipid Panel     Component Value Date/Time   CHOL 146 11/22/2014 0919   TRIG 53.0 11/22/2014 0919   TRIG 85 02/05/2006 0818   HDL 69.20 11/22/2014 0919   CHOLHDL 2 11/22/2014 0919   VLDL 10.6 11/22/2014 0919   LDLCALC 66 11/22/2014 0919      Wt Readings from Last 3 Encounters:  03/13/16 102 lb 1.9 oz (46.3 kg)  01/01/16 101 lb (45.8 kg)  12/18/15 101 lb 12.8 oz (46.2 kg)     Cardiac Studies Reviewed: Carotid Duplex 2015/12/14: Impressions Duplex imaging, with color Doppler, of the carotid arteries reveals heterogeneous plaque in the bilateral bifurcations. Bilateral ICA velocities are elevated and stable. Bilateral subclavian artery velocities are within normal range. The vertebral arteries are patent with antegrade flow, bilaterally. Technologist Notes Plaque Plaque Examination Data cm/s cm/s Heterogeneous plaque, bilaterally. Stable 40-59% bilateral ICA stenosis. Normal subclavian arteries, bilaterally. Patent vertebral arteries with antegrade flow. f/u 1 year  2D Echo 06-22-2014: Study Conclusions  - Left ventricle: The cavity size was normal. Systolic function was normal. The estimated ejection fraction was in the range of 55% to 60%.  Wall motion was normal; there were no regional wall motion abnormalities. Features are consistent with a pseudonormal left ventricular filling pattern, with concomitant abnormal relaxation and increased filling pressure (grade 2 diastolic dysfunction). - Ventricular septum: Septal motion showed moderate paradox. These changes are consistent with intraventricular conduction delay. - Aortic valve: Moderate thickening and calcification, consistent with sclerosis. There was moderate regurgitation. - Mitral valve: Mildly thickened leaflets . There was mild regurgitation. - Atrial septum: There was increased thickness of the septum, consistent with lipomatous hypertrophy. - Tricuspid valve: There was mild regurgitation.  ASSESSMENT AND PLAN: 1.  Coronary artery disease, native vessel: Doing well without angina. Her medical program is reviewed and will be continued without change.  2. Carotid stenosis without history of stroke or TIA: Most recent Doppler with 40-59% bilateral ICA stenosis. Follow-up duplex scan next year.  3. Moderate mitral regurgitation: NYHA functional class I symptoms. Most recent echo from 2016 reviewed. Clinical follow-up in one year. The patient has long-standing mitral regurgitation.  4. Hypertension: Blood pressure has been controlled. On my recheck today it is lower. She's been to multiple doctors visits and running around town this afternoon. Continue to monitor on current medications.  5. Hyperlipidemia: treated with atorvastatin. Lipids as above. LFT's 10-05-2015 reviewed and within normal limits.   Current medicines are reviewed with the patient today.  The patient does not have concerns regarding medicines.  Labs/ tests ordered today include:   Orders Placed This Encounter  Procedures  . EKG 12-Lead   Disposition:   FU one year  Signed, Sherren Mocha, MD  03/13/2016 5:24 PM    [address],  [place], [place]  [address] Phone: [phone]; Fax: [phone]

## 2016-03-17 ENCOUNTER — Ambulatory Visit: Payer: Medicare Other | Admitting: Cardiovascular Disease

## 2016-03-17 ENCOUNTER — Ambulatory Visit: Payer: Medicare Other

## 2016-03-18 ENCOUNTER — Ambulatory Visit (INDEPENDENT_AMBULATORY_CARE_PROVIDER_SITE_OTHER): Payer: Medicare Other

## 2016-03-18 DIAGNOSIS — Z862 Personal history of diseases of the blood and blood-forming organs and certain disorders involving the immune mechanism: Secondary | ICD-10-CM | POA: Diagnosis not present

## 2016-03-18 MED ORDER — CYANOCOBALAMIN 1000 MCG/ML IJ SOLN
1000.0000 ug | Freq: Once | INTRAMUSCULAR | Status: AC
  Administered 2016-03-18: 1000 ug via INTRAMUSCULAR

## 2016-03-19 ENCOUNTER — Encounter: Payer: Self-pay | Admitting: Podiatry

## 2016-03-19 ENCOUNTER — Ambulatory Visit (INDEPENDENT_AMBULATORY_CARE_PROVIDER_SITE_OTHER): Payer: Medicare Other | Admitting: Podiatry

## 2016-03-19 DIAGNOSIS — M79671 Pain in right foot: Secondary | ICD-10-CM

## 2016-03-19 DIAGNOSIS — B351 Tinea unguium: Secondary | ICD-10-CM | POA: Diagnosis not present

## 2016-03-19 DIAGNOSIS — M79672 Pain in left foot: Secondary | ICD-10-CM | POA: Diagnosis not present

## 2016-03-19 NOTE — Patient Instructions (Signed)
Seen for hypertrophic nails. All nails debrided. Return in 3 months or as needed.  

## 2016-03-19 NOTE — Progress Notes (Signed)
SUBJECTIVE: 82 y.o. year old female presents with painful thick toe nails.  OBJECTIVE: DERMATOLOGIC EXAMINATION: Hypertrophic nails x 10. Thick dystrophic nail devitalized right great toe at nail bed.  No open skin lesions.   VASCULAR EXAMINATION OF LOWER LIMBS: All pedal pulses are palpable with normal pulsation.  Capillary Filling times within 3 seconds in all digits.  Both feet are cold to touch.   NEUROLOGIC EXAMINATION OF THE LOWER LIMBS: All epicritic and tactile sensations grossly intactl.   MUSCULOSKELETAL EXAMINATION: No gross deformities noted.   ASSESSMENT: Mycotic nails and deformed right great toe nail.   PLAN:  Reviewed findings and available treatment options. All nails debrided.  Return in 3 month for routine foot care.

## 2016-03-25 DIAGNOSIS — M810 Age-related osteoporosis without current pathological fracture: Secondary | ICD-10-CM | POA: Diagnosis not present

## 2016-03-25 DIAGNOSIS — H353121 Nonexudative age-related macular degeneration, left eye, early dry stage: Secondary | ICD-10-CM | POA: Diagnosis not present

## 2016-03-25 DIAGNOSIS — H353111 Nonexudative age-related macular degeneration, right eye, early dry stage: Secondary | ICD-10-CM | POA: Diagnosis not present

## 2016-03-25 DIAGNOSIS — R7303 Prediabetes: Secondary | ICD-10-CM | POA: Diagnosis not present

## 2016-03-25 LAB — BASIC METABOLIC PANEL
BUN: 18 mg/dL (ref 4–21)
Creatinine: 0.9 mg/dL (ref 0.5–1.1)
Glucose: 96 mg/dL
Potassium: 4.4 mmol/L (ref 3.4–5.3)
Sodium: 142 mmol/L (ref 137–147)

## 2016-03-25 LAB — HEPATIC FUNCTION PANEL
ALT: 21 U/L (ref 7–35)
AST: 28 U/L (ref 13–35)

## 2016-03-26 ENCOUNTER — Ambulatory Visit: Payer: Medicare Other

## 2016-03-31 ENCOUNTER — Encounter: Payer: Self-pay | Admitting: Gastroenterology

## 2016-03-31 ENCOUNTER — Ambulatory Visit (INDEPENDENT_AMBULATORY_CARE_PROVIDER_SITE_OTHER): Payer: Medicare Other | Admitting: Gastroenterology

## 2016-03-31 VITALS — BP 132/50 | HR 72 | Ht 64.0 in | Wt 103.5 lb

## 2016-03-31 DIAGNOSIS — K52832 Lymphocytic colitis: Secondary | ICD-10-CM | POA: Diagnosis not present

## 2016-03-31 NOTE — Progress Notes (Signed)
Review of pertinent gastrointestinal problems: 1. Adenomatous polyps in colon: several colonoscopies Dr. Verl Blalock over the years. Her first was in 1993, her next one was in 2002, her next was in 2006. No polyps removed in those 3 procedures. Colonoscopy Dr. Ardis Hughs 08/2013 (for FOBT + stool); 2 polyps removed, both TAs, the largest (1.5cm rectum) contained HGD but appeared completely removed endoscopically.  Repeat colonoscopy 12/2015; 60mm rectal polyp, TA on path; no recall given her age. 2. Nausea, dyspepsia: EGD Ardis Hughs 12/2013 found mild to moderate gastritis, biopsies from stomach showed no H. Pylori, no neoplasm 3. Cholelithiasis: noted on [place] 10/2013; underwent lap chole 2017; was inflamed per patient. 4. Lymphocytic colitis: 12/2015 colonoscopy for change in bowels, found above polyp. TI was normal.  Random colon biopsies showed LC.  She was started on budesonide.   HPI: This is a   very pleasant 82 year old woman whom I last saw about 3 months ago the time of colonoscopy. See those results summarized above    Chief complaint is lymphocytic colitis  Sinus infection 2-3 weeks ago, slowly getting over it.  Was on antibiotics.  Feels like she is having incomplete, then has a leakage.  She has been taking budesonide 9mg  daily for 2-3 months per.  She was going 2-4 bms per day.   After the budesonide she's been going once per day.  Takes citrucel daily.   ROS: complete GI ROS as described in HPI.  Constitutional:  No unintentional weight loss   Past Medical History:  Diagnosis Date  . Anemia, pernicious   . Arthritis    bursitis   . Atrophic gastritis   . B12 deficiency   . CAD (coronary artery disease)   . Carotid artery stenosis   . Diarrhea    on and off for months  . Family history of adverse reaction to anesthesia    extreme nausea brother and sister  . Fibromyalgia   . Gallbladder polyp   . GERD (gastroesophageal reflux disease)   . Heart murmur   . History of  hiatal hernia   . History of kidney stones   . HLD (hyperlipidemia)   . HTN (hypertension)   . Hypothyroidism   . Irritable bowel syndrome (IBS)   . Median arcuate ligament syndrome ([place])   . PVD (peripheral vascular disease) ([place])   . Tubular adenoma of colon 08/2013   with focal high grade dysplasia    Past Surgical History:  Procedure Laterality Date  . ABDOMINAL HYSTERECTOMY    . APPENDECTOMY    . BIOPSY THYROID    . CHOLECYSTECTOMY N/A 10/24/2014   Procedure: MICROSCOPIC  CHOLECYSTECTOMY;  Surgeon: Ralene Ok, MD;  Location: [facility];  Service: General;  Laterality: N/A;  . CORONARY ARTERY BYPASS GRAFT  07  . ESOPHAGEAL MANOMETRY N/A 08/02/2012   Procedure: ESOPHAGEAL MANOMETRY (EM);  Surgeon: Sable Feil, MD;  Location: [facility];  Service: Endoscopy;  Laterality: N/A;  . THYROID LOBECTOMY      Current Outpatient Prescriptions  Medication Sig Dispense Refill  . acetaminophen (TYLENOL) 500 MG tablet Take 500 mg by mouth every 6 (six) hours as needed (pain).    . AFLURIA QUADRIVALENT injection Inject as directed once.    Marland Kitchen amLODipine (NORVASC) 10 MG tablet Take 1 tablet (10 mg total) by mouth daily. 30 tablet 11  . aspirin EC 81 MG tablet Take 81 mg by mouth at bedtime.    Marland Kitchen atorvastatin (LIPITOR) 80 MG tablet TAKE 1 TABLET BY MOUTH ONCE  DAILY AT BEDTIME. 30 tablet 11  . azelastine (ASTELIN) 0.1 % nasal spray Place 1 spray into both nostrils 2 (two) times daily as needed for rhinitis or allergies. Use in each nostril as directed    . budesonide (ENTOCORT EC) 3 MG 24 hr capsule Take 3 capsules (9 mg total) by mouth daily. 90 capsule 3  . Calcium Carbonate-Vitamin D (CALTRATE 600+D PO) Take 600 mg by mouth daily with lunch.    Marland Kitchen CARAFATE 1 GM/10ML suspension TAKE 10 MLS (1 G TOTAL) BY MOUTH 2 (TWO) TIMES DAILY. 420 mL 1  . carvedilol (COREG) 6.25 MG tablet TAKE 1/2 TABLET BY MOUTH TWICE DAILY 60 tablet 11  . Cholecalciferol (VITAMIN D) 2000 UNITS tablet Take 2,000 Units  by mouth daily with lunch.    . cyanocobalamin (,VITAMIN B-12,) 1000 MCG/ML injection Inject 1,000 mcg into the muscle every 30 (thirty) days. On or about the end of each month    . fluticasone (FLONASE) 50 MCG/ACT nasal spray Place 2 sprays into the nose daily as needed. (Patient taking differently: Place 1 spray into both nostrils daily as needed for allergies. ) 16 g 11  . levothyroxine (SYNTHROID, LEVOTHROID) 50 MCG tablet Take 50 mcg by mouth daily before breakfast.    . losartan (COZAAR) 100 MG tablet TAKE 1 TABLET BY MOUTH DAILY. 30 tablet 11  . Methylcellulose, Laxative, (CITRUCEL PO) Take by mouth as needed.    . Multiple Vitamins-Minerals (PRESERVISION AREDS 2) CAPS Take 1 tablet by mouth daily with lunch.    Marland Kitchen OVER THE COUNTER MEDICATION Place 1 drop into both eyes 2 (two) times daily as needed (dry eyes). Preservative free over the counter eye drop for dry eyes    . saccharomyces boulardii (FLORASTOR) 250 MG capsule Take 250 mg by mouth daily with lunch.    . sodium chloride (OCEAN) 0.65 % nasal spray Place 1 spray into both nostrils 3 (three) times daily as needed for congestion.     Marland Kitchen spironolactone (ALDACTONE) 25 MG tablet TAKE 1 TABLET BY MOUTH ONCE DAILY. 30 tablet 11  . traMADol (ULTRAM) 50 MG tablet Take 50 mg by mouth every 6 (six) hours as needed (pain).      No current facility-administered medications for this visit.     Allergies as of 03/31/2016 - Review Complete 03/31/2016  Allergen Reaction Noted  . Actos [pioglitazone] Swelling 10/06/2013  . Sulfonamide derivatives Swelling 01/08/2007  . Codeine Nausea And Vomiting 01/08/2007    Family History  Problem Relation Age of Onset  . Heart disease Mother   . Cholelithiasis Mother   . Prostate cancer Brother   . Colon cancer Brother   . Breast cancer Other     niece - breast cancer  . Diabetes Sister   . Lung cancer Father   . Lung cancer Sister   . Stomach cancer Cousin   . Esophageal cancer Neg Hx      Social History   Social History  . Marital status: Married    Spouse name: N/A  . Number of children: N/A  . Years of education: 45   Occupational History  . retired   .  Retired   Social History Main Topics  . Smoking status: Never Smoker  . Smokeless tobacco: Never Used  . Alcohol use No  . Drug use: No  . Sexual activity: Not on file   Other Topics Concern  . Not on file   Social History Narrative   [name], Married. Lives with  husband. Had been a very active woman.     Physical Exam: BP (!) 132/50   Pulse 72   Ht 5\' 4"  (1.626 m)   Wt 103 lb 8 oz (46.9 kg)   BMI 17.77 kg/m  Constitutional: generally well-appearing Psychiatric: alert and oriented x3 Abdomen: soft, nontender, nondistended, no obvious ascites, no peritoneal signs, normal bowel sounds No peripheral edema noted in lower extremities  Assessment and plan: 82 y.o. female with Lymphocytic colitis  Her loose stools are under better control since starting budesonide 9 mg once daily. She is going to decrease to 6 mg starting tomorrow. She will continue that dose for 6 weeks and then decrease to 3 mg once daily. She'll return to see me in the office in 3-4 months. I suspect her recent slight worsening of her bowel habits is likely related to antibiotics that she has been taking for sinus infection.   Owens Loffler, MD [facility] 03/31/2016, 2:17 PM

## 2016-03-31 NOTE — Patient Instructions (Signed)
Stop your citrucel supplement. Decrease the budesonide to 2 pills once daily for 6 weeks. Then decrease to one pill once daily. Please return to see Dr. Ardis Hughs in 3-4 months.  Sooner if any troubles. Call if you need refills of the budesonide.

## 2016-04-08 ENCOUNTER — Encounter: Payer: Self-pay | Admitting: Family Medicine

## 2016-04-16 ENCOUNTER — Ambulatory Visit (HOSPITAL_COMMUNITY)
Admission: RE | Admit: 2016-04-16 | Discharge: 2016-04-16 | Disposition: A | Discharge status: Home / Self Care | Payer: Medicare Other | Source: Ambulatory Visit | Attending: Internal Medicine | Admitting: Internal Medicine

## 2016-04-16 ENCOUNTER — Encounter (HOSPITAL_COMMUNITY): Payer: Self-pay

## 2016-04-16 DIAGNOSIS — K219 Gastro-esophageal reflux disease without esophagitis: Secondary | ICD-10-CM | POA: Diagnosis not present

## 2016-04-16 DIAGNOSIS — M810 Age-related osteoporosis without current pathological fracture: Secondary | ICD-10-CM | POA: Insufficient documentation

## 2016-04-16 MED ORDER — ZOLEDRONIC ACID 5 MG/100ML IV SOLN
5.0000 mg | Freq: Once | INTRAVENOUS | Status: AC
Start: 2016-04-16 — End: 2016-04-16
  Administered 2016-04-16: 5 mg via INTRAVENOUS
  Filled 2016-04-16: qty 100

## 2016-04-16 MED ORDER — SODIUM CHLORIDE 0.9 % IV SOLN
Freq: Once | INTRAVENOUS | Status: AC
  Administered 2016-04-16: 12:00:00 via INTRAVENOUS

## 2016-04-16 NOTE — Discharge Instructions (Signed)
Zoledronic Acid injection (Paget's Disease, Osteoporosis)/ reclast What is this medicine? ZOLEDRONIC ACID (ZOE le dron ik AS id) lowers the amount of calcium loss from bone. It is used to treat Paget's disease and osteoporosis in women. COMMON BRAND NAME(S): Reclast, Zometa What should I tell my health care provider before I take this medicine? They need to know if you have any of these conditions: -aspirin-sensitive asthma -cancer, especially if you are receiving medicines used to treat cancer -dental disease or wear dentures -infection -kidney disease -low levels of calcium in the blood -past surgery on the parathyroid gland or intestines -receiving corticosteroids like dexamethasone or prednisone -an unusual or allergic reaction to zoledronic acid, other medicines, foods, dyes, or preservatives -pregnant or trying to get pregnant -breast-feeding How should I use this medicine? This medicine is for infusion into a vein. It is given by a health care professional in a hospital or clinic setting. Talk to your pediatrician regarding the use of this medicine in children. This medicine is not approved for use in children. What if I miss a dose? It is important not to miss your dose. Call your doctor or health care professional if you are unable to keep an appointment. What may interact with this medicine? -certain antibiotics given by injection -NSAIDs, medicines for pain and inflammation, like ibuprofen or naproxen -some diuretics like bumetanide, furosemide -teriparatide What should I watch for while using this medicine? Visit your doctor or health care professional for regular checkups. It may be some time before you see the benefit from this medicine. Do not stop taking your medicine unless your doctor tells you to. Your doctor may order blood tests or other tests to see how you are doing. Women should inform their doctor if they wish to become pregnant or think they might be pregnant.  There is a potential for serious side effects to an unborn child. Talk to your health care professional or pharmacist for more information. You should make sure that you get enough calcium and vitamin D while you are taking this medicine. Discuss the foods you eat and the vitamins you take with your health care professional. Some people who take this medicine have severe bone, joint, and/or muscle pain. This medicine may also increase your risk for jaw problems or a broken thigh bone. Tell your doctor right away if you have severe pain in your jaw, bones, joints, or muscles. Tell your doctor if you have any pain that does not go away or that gets worse. Tell your dentist and dental surgeon that you are taking this medicine. You should not have major dental surgery while on this medicine. See your dentist to have a dental exam and fix any dental problems before starting this medicine. Take good care of your teeth while on this medicine. Make sure you see your dentist for regular follow-up appointments. What side effects may I notice from receiving this medicine? Side effects that you should report to your doctor or health care professional as soon as possible: -allergic reactions like skin rash, itching or hives, swelling of the face, lips, or tongue -anxiety, confusion, or depression -breathing problems -changes in vision -eye pain -feeling faint or lightheaded, falls -jaw pain, especially after dental work -mouth sores -muscle cramps, stiffness, or weakness -redness, blistering, peeling or loosening of the skin, including inside the mouth -trouble passing urine or change in the amount of urine Side effects that usually do not require medical attention (report to your doctor or health care professional  if they continue or are bothersome): -bone, joint, or muscle pain -constipation -diarrhea -fever -hair loss -irritation at site where injected -loss of appetite -nausea, vomiting -stomach  upset -trouble sleeping -trouble swallowing -weak or tired Where should I keep my medicine? This drug is given in a hospital or clinic and will not be stored at home.  2017 Elsevier/Gold Standard (2013-09-10 14:19:57)

## 2016-04-17 ENCOUNTER — Ambulatory Visit (INDEPENDENT_AMBULATORY_CARE_PROVIDER_SITE_OTHER): Payer: Medicare Other

## 2016-04-17 DIAGNOSIS — E538 Deficiency of other specified B group vitamins: Secondary | ICD-10-CM | POA: Diagnosis not present

## 2016-04-17 MED ORDER — CYANOCOBALAMIN 1000 MCG/ML IJ SOLN
1000.0000 ug | Freq: Once | INTRAMUSCULAR | Status: AC
  Administered 2016-04-17: 1000 ug via INTRAMUSCULAR

## 2016-05-19 ENCOUNTER — Ambulatory Visit: Payer: Medicare Other

## 2016-05-19 DIAGNOSIS — Z8639 Personal history of other endocrine, nutritional and metabolic disease: Secondary | ICD-10-CM | POA: Diagnosis not present

## 2016-05-19 DIAGNOSIS — M810 Age-related osteoporosis without current pathological fracture: Secondary | ICD-10-CM | POA: Diagnosis not present

## 2016-05-19 DIAGNOSIS — R7303 Prediabetes: Secondary | ICD-10-CM | POA: Diagnosis not present

## 2016-05-20 ENCOUNTER — Ambulatory Visit (INDEPENDENT_AMBULATORY_CARE_PROVIDER_SITE_OTHER): Payer: Medicare Other

## 2016-05-20 DIAGNOSIS — E538 Deficiency of other specified B group vitamins: Secondary | ICD-10-CM

## 2016-05-20 MED ORDER — CYANOCOBALAMIN 1000 MCG/ML IJ SOLN
1000.0000 ug | Freq: Once | INTRAMUSCULAR | Status: AC
  Administered 2016-05-20: 1000 ug via INTRAMUSCULAR

## 2016-05-20 NOTE — Progress Notes (Signed)
Patient received her B12 injection for Vit B12 deficiency on 05/20/2016 at 10.50am. Given by Rosealee Albee CMA.

## 2016-05-22 ENCOUNTER — Other Ambulatory Visit: Payer: Self-pay | Admitting: Family Medicine

## 2016-06-19 ENCOUNTER — Ambulatory Visit (INDEPENDENT_AMBULATORY_CARE_PROVIDER_SITE_OTHER): Payer: Medicare Other | Admitting: Podiatry

## 2016-06-19 DIAGNOSIS — B351 Tinea unguium: Secondary | ICD-10-CM

## 2016-06-19 DIAGNOSIS — L600 Ingrowing nail: Secondary | ICD-10-CM | POA: Diagnosis not present

## 2016-06-19 DIAGNOSIS — M79672 Pain in left foot: Secondary | ICD-10-CM

## 2016-06-19 DIAGNOSIS — M79671 Pain in right foot: Secondary | ICD-10-CM | POA: Diagnosis not present

## 2016-06-19 NOTE — Progress Notes (Signed)
SUBJECTIVE: 83 y.o.year old femalepresents with painful thick toe nails.  OBJECTIVE: DERMATOLOGIC EXAMINATION: Hypertrophic nails x 10. Thick dystrophic nail devitalized right great toe at nail bed.  No open skin lesions.   VASCULAR EXAMINATION OF LOWER LIMBS: All pedal pulses are palpable with normal pulsation.  Capillary Filling times within 3 seconds in all digits.  Both feet are cold to touch.   NEUROLOGIC EXAMINATION OF THE LOWER LIMBS: All epicritic and tactile sensations grossly intactl.   MUSCULOSKELETAL EXAMINATION: No gross deformities noted.   ASSESSMENT: Mycotic nails and deformed right great toe nail.  Painful feet.  PLAN: Reviewed findings and available treatment options. All nails debrided.  Return in 3 month for routine foot care.

## 2016-06-19 NOTE — Patient Instructions (Signed)
Seen for hypertrophic nails. All nails debrided. Return in 3 months or as needed.  

## 2016-06-20 ENCOUNTER — Encounter: Payer: Self-pay | Admitting: Podiatry

## 2016-06-24 ENCOUNTER — Ambulatory Visit (INDEPENDENT_AMBULATORY_CARE_PROVIDER_SITE_OTHER): Payer: Medicare Other

## 2016-06-24 DIAGNOSIS — E538 Deficiency of other specified B group vitamins: Secondary | ICD-10-CM

## 2016-06-24 DIAGNOSIS — Z780 Asymptomatic menopausal state: Secondary | ICD-10-CM | POA: Diagnosis not present

## 2016-06-24 DIAGNOSIS — M810 Age-related osteoporosis without current pathological fracture: Secondary | ICD-10-CM | POA: Diagnosis not present

## 2016-06-24 MED ORDER — CYANOCOBALAMIN 1000 MCG/ML IJ SOLN
1000.0000 ug | Freq: Once | INTRAMUSCULAR | Status: AC
  Administered 2016-06-24: 1000 ug via INTRAMUSCULAR

## 2016-06-24 NOTE — Progress Notes (Signed)
Patient got her  B12 injection for Vit B deficiency on 06/24/2016 at 4.20pm. Given by Rosealee Albee CMA.

## 2016-07-01 ENCOUNTER — Ambulatory Visit (INDEPENDENT_AMBULATORY_CARE_PROVIDER_SITE_OTHER): Payer: Medicare Other | Admitting: Gastroenterology

## 2016-07-01 ENCOUNTER — Encounter: Payer: Self-pay | Admitting: Gastroenterology

## 2016-07-01 VITALS — BP 144/52 | HR 56 | Ht 64.0 in | Wt 102.2 lb

## 2016-07-01 DIAGNOSIS — K52832 Lymphocytic colitis: Secondary | ICD-10-CM | POA: Diagnosis not present

## 2016-07-01 MED ORDER — CHOLESTYRAMINE 4 GM/DOSE PO POWD: 4.0000 g | Freq: Every day | ORAL | 11 refills | Status: DC

## 2016-07-01 NOTE — Patient Instructions (Addendum)
Trial of cholestyramine powder, once daily. 4grm powder once daily, disp one month with 11 refills. Call 4-5 weeks to report on your progress.

## 2016-07-01 NOTE — Progress Notes (Signed)
Review of pertinent gastrointestinal problems: 1. Adenomatous polyps in colon:several colonoscopies Dr. Verl Blalock over the years. Her first was in 1993, her next one was in 2002, her next was in 2006. No polyps removed in those 3 procedures. Colonoscopy Dr. Ardis Hughs 08/2013 (for FOBT + stool); 2 polyps removed, both TAs, the largest (1.5cm rectum) contained HGD but appeared completely removed endoscopically.  Repeat colonoscopy 12/2015; 62mm rectal polyp, TA on path; no recall given her age. 2. Nausea, dyspepsia: EGD Ardis Hughs 12/2013 found mild to moderate gastritis, biopsies from stomach showed no H. Pylori, no neoplasm 3. Cholelithiasis:noted on [place] 10/2013; underwent lap chole 2017; was inflamed per patient. 4. Lymphocytic colitis: 12/2015 colonoscopy for change in bowels, found above polyp. TI was normal.  Random colon biopsies showed LC.  She was started on budesonide with eventual improvement in her symptoms.  HPI: [name]  She is off the budesonide as of about a week ago.  Bowels are under much better control.  She was having significant watery diarrhea 3-4 times a day. Soon after starting the budesonide her symptoms dramatically improved.  She is bothered by intermittent loose issues stools, feeling of incomplete evacuation, crampiness, abdominal discomfort  Can still have some unformed stools.  Very scattered historian.   ROS: complete GI ROS as described in HPI.  Constitutional:  No unintentional weight loss   Past Medical History:  Diagnosis Date  . Anemia, pernicious   . Arthritis    bursitis   . Atrophic gastritis   . B12 deficiency   . CAD (coronary artery disease)   . Carotid artery stenosis   . Diarrhea    on and off for months  . Family history of adverse reaction to anesthesia    extreme nausea brother and sister  . Fibromyalgia   . Gallbladder polyp   . GERD (gastroesophageal reflux disease)   .  Heart murmur   . History of hiatal hernia   . History of kidney stones   . HLD (hyperlipidemia)   . HTN (hypertension)   . Hypothyroidism   . Irritable bowel syndrome (IBS)   . Median arcuate ligament syndrome ([place])   . PVD (peripheral vascular disease) ([place])   . Tubular adenoma of colon 08/2013   with focal high grade dysplasia    Past Surgical History:  Procedure Laterality Date  . ABDOMINAL HYSTERECTOMY    . APPENDECTOMY    . BIOPSY THYROID    . CHOLECYSTECTOMY N/A 10/24/2014   Procedure: MICROSCOPIC  CHOLECYSTECTOMY;  Surgeon: Ralene Ok, MD;  Location: [facility];  Service: General;  Laterality: N/A;  . CORONARY ARTERY BYPASS GRAFT  07  . ESOPHAGEAL MANOMETRY N/A 08/02/2012   Procedure: ESOPHAGEAL MANOMETRY (EM);  Surgeon: Sable Feil, MD;  Location: [facility];  Service: Endoscopy;  Laterality: N/A;  . THYROID LOBECTOMY      Current Outpatient Prescriptions  Medication Sig Dispense Refill  . acetaminophen (TYLENOL) 500 MG tablet Take 500 mg by mouth every 6 (six) hours as needed (pain).    . AFLURIA QUADRIVALENT injection Inject as directed once.    Marland Kitchen amLODipine (NORVASC) 10 MG tablet TAKE 1 TABLET BY MOUTH DAILY 90 tablet 2  . aspirin EC 81 MG tablet Take 81 mg by mouth at bedtime.    Marland Kitchen atorvastatin (LIPITOR) 80 MG tablet TAKE 1 TABLET BY MOUTH ONCE DAILY AT BEDTIME. 30 tablet 11  . azelastine (ASTELIN) 0.1 %  nasal spray Place 1 spray into both nostrils 2 (two) times daily as needed for rhinitis or allergies. Use in each nostril as directed    . Calcium Carbonate-Vitamin D (CALTRATE 600+D PO) Take 600 mg by mouth daily with lunch.    Marland Kitchen CARAFATE 1 GM/10ML suspension TAKE 10 MLS (1 G TOTAL) BY MOUTH 2 (TWO) TIMES DAILY. 420 mL 1  . carvedilol (COREG) 6.25 MG tablet TAKE 1/2 TABLET BY MOUTH TWICE DAILY 60 tablet 11  . Cholecalciferol (VITAMIN D) 2000 UNITS tablet Take 2,000 Units by mouth daily with lunch.    . cyanocobalamin (,VITAMIN B-12,) 1000 MCG/ML injection Inject  1,000 mcg into the muscle every 30 (thirty) days. On or about the end of each month    . fluticasone (FLONASE) 50 MCG/ACT nasal spray Place 2 sprays into the nose daily as needed. (Patient taking differently: Place 1 spray into both nostrils daily as needed for allergies. ) 16 g 11  . levothyroxine (SYNTHROID, LEVOTHROID) 50 MCG tablet Take 50 mcg by mouth daily before breakfast.    . losartan (COZAAR) 100 MG tablet TAKE 1 TABLET BY MOUTH DAILY. 30 tablet 11  . Methylcellulose, Laxative, (CITRUCEL PO) Take by mouth as needed.    . Multiple Vitamins-Minerals (PRESERVISION AREDS 2) CAPS Take 1 tablet by mouth daily with lunch.    Marland Kitchen OVER THE COUNTER MEDICATION Place 1 drop into both eyes 2 (two) times daily as needed (dry eyes). Preservative free over the counter eye drop for dry eyes    . saccharomyces boulardii (FLORASTOR) 250 MG capsule Take 250 mg by mouth daily with lunch.    . sodium chloride (OCEAN) 0.65 % nasal spray Place 1 spray into both nostrils 3 (three) times daily as needed for congestion.     Marland Kitchen spironolactone (ALDACTONE) 25 MG tablet TAKE 1 TABLET BY MOUTH ONCE DAILY. 30 tablet 11  . traMADol (ULTRAM) 50 MG tablet Take 50 mg by mouth every 6 (six) hours as needed (pain).      No current facility-administered medications for this visit.     Allergies as of 07/01/2016 - Review Complete 07/01/2016  Allergen Reaction Noted  . Actos [pioglitazone] Swelling 10/06/2013  . Sulfonamide derivatives Swelling 01/08/2007  . Codeine Nausea And Vomiting 01/08/2007    Family History  Problem Relation Age of Onset  . Heart disease Mother   . Cholelithiasis Mother   . Prostate cancer Brother   . Colon cancer Brother   . Breast cancer Other     niece - breast cancer  . Diabetes Sister   . Lung cancer Father   . Lung cancer Sister   . Stomach cancer Cousin   . Esophageal cancer Neg Hx     Social History   Social History  . Marital status: Married    Spouse name: N/A  . Number of  children: N/A  . Years of education: 39   Occupational History  . retired   .  Retired   Social History Main Topics  . Smoking status: Never Smoker  . Smokeless tobacco: Never Used  . Alcohol use No  . Drug use: No  . Sexual activity: Not on file   Other Topics Concern  . Not on file   Social History Narrative   [name], Married. Lives with husband. Had been a very active woman.     Physical Exam: BP (!) 144/52   Pulse (!) 56   Ht 5\' 4"  (1.626 m)   Wt 102 lb 3.2  oz (46.4 kg)   BMI 17.54 kg/m  Constitutional: generally well-appearing Psychiatric: alert and oriented x3 Abdomen: soft, nontender, nondistended, no obvious ascites, no peritoneal signs, normal bowel sounds No peripheral edema noted in lower extremities  Assessment and plan: 83 y.o. female with lymphocytic colitis, much improved. Still irregular bowel habits  I do not think her current irregular bowel habits are related to ongoing lymphocytic colitis. She had such a dramatic response to the budesonide. Her watery diarrhea really improved. I think we are left more with loose stools related to gallbladder surgery about a year ago, some mild cramping sounds, some functional symptoms. Going to try her on cholestyramine powder 4 g once daily and she will call to report on her response in 4-6 weeks.  Please see the "Patient Instructions" section for addition details about the plan.  Owens Loffler, MD [facility] 07/01/2016, 3:35 PM

## 2016-07-02 DIAGNOSIS — H353112 Nonexudative age-related macular degeneration, right eye, intermediate dry stage: Secondary | ICD-10-CM | POA: Diagnosis not present

## 2016-07-02 DIAGNOSIS — H04123 Dry eye syndrome of bilateral lacrimal glands: Secondary | ICD-10-CM | POA: Diagnosis not present

## 2016-07-02 DIAGNOSIS — H524 Presbyopia: Secondary | ICD-10-CM | POA: Diagnosis not present

## 2016-07-02 DIAGNOSIS — H16103 Unspecified superficial keratitis, bilateral: Secondary | ICD-10-CM | POA: Diagnosis not present

## 2016-07-14 ENCOUNTER — Other Ambulatory Visit: Payer: Self-pay | Admitting: Family Medicine

## 2016-07-22 ENCOUNTER — Ambulatory Visit: Payer: Medicare Other

## 2016-07-24 ENCOUNTER — Other Ambulatory Visit: Payer: Self-pay | Admitting: Family Medicine

## 2016-07-29 ENCOUNTER — Ambulatory Visit (INDEPENDENT_AMBULATORY_CARE_PROVIDER_SITE_OTHER): Payer: Medicare Other

## 2016-07-29 DIAGNOSIS — E538 Deficiency of other specified B group vitamins: Secondary | ICD-10-CM | POA: Diagnosis not present

## 2016-07-29 MED ORDER — CYANOCOBALAMIN 1000 MCG/ML IJ SOLN
1000.0000 ug | Freq: Once | INTRAMUSCULAR | Status: AC
  Administered 2016-07-29: 1000 ug via INTRAMUSCULAR

## 2016-07-30 ENCOUNTER — Telehealth: Payer: Self-pay | Admitting: Gastroenterology

## 2016-07-30 NOTE — Telephone Encounter (Signed)
Pt states she is doing very well on cholestyramine, her bowels are now normal and her appetite is back.  She has more energy.  Had one episode of very pale, slight rectal bleeding on TP.  She will continue to monitor for further bleeding and call if it worsens.

## 2016-07-31 NOTE — Telephone Encounter (Signed)
I agree, thanks!

## 2016-08-15 ENCOUNTER — Ambulatory Visit (INDEPENDENT_AMBULATORY_CARE_PROVIDER_SITE_OTHER): Payer: Medicare Other | Admitting: Family Medicine

## 2016-08-15 ENCOUNTER — Encounter: Payer: Self-pay | Admitting: Family Medicine

## 2016-08-15 VITALS — BP 140/70 | HR 57 | Temp 98.0°F | Wt 102.9 lb

## 2016-08-15 DIAGNOSIS — M545 Low back pain, unspecified: Secondary | ICD-10-CM

## 2016-08-15 DIAGNOSIS — R8290 Unspecified abnormal findings in urine: Secondary | ICD-10-CM | POA: Diagnosis not present

## 2016-08-15 LAB — POCT URINALYSIS DIPSTICK
Bilirubin, UA: NEGATIVE
Glucose, UA: NEGATIVE
Ketones, UA: NEGATIVE
Nitrite, UA: NEGATIVE
Protein, UA: NEGATIVE
Spec Grav, UA: 1.01 (ref 1.010–1.025)
Urobilinogen, UA: 0.2 E.U./dL
pH, UA: 6.5 (ref 5.0–8.0)

## 2016-08-15 MED ORDER — TRAMADOL HCL 50 MG PO TABS: 50.0000 mg | ORAL_TABLET | Freq: Four times a day (QID) | ORAL | 1 refills | Status: DC | PRN

## 2016-08-15 NOTE — Progress Notes (Signed)
Subjective:     Patient ID: [name], female   DOB: 1932/11/01, 83 y.o.   MRN: [id number]  HPI Patient seen for multiple issues as follows:  She's had history of low back pain in the past multiple times. Denies any recent fall or injury. Her location is lower lumbar with radiation bilateral. Occasional radiculitis type symptoms. She usually feels somewhat better when moving around. Denies any focal weakness. No fevers or chills. Back pain is starting to interfere more with sleep. She's tried Tylenol without improvement.  Has also tried heat and ice without improvement. She had physical therapy years ago which seemed to help. She had DEXA scan recently through endocrinology and apparently bone density was stable.  Second issue is that she complains of increased "odor" with urine over the past couple weeks. Some increased frequency but no burning. No fevers or chills. No flank pain.  No nausea or vomiting.  Past Medical History:  Diagnosis Date  . Anemia, pernicious   . Arthritis    bursitis   . Atrophic gastritis   . B12 deficiency   . CAD (coronary artery disease)   . Carotid artery stenosis   . Diarrhea    on and off for months  . Family history of adverse reaction to anesthesia    extreme nausea brother and sister  . Fibromyalgia   . Gallbladder polyp   . GERD (gastroesophageal reflux disease)   . Heart murmur   . History of hiatal hernia   . History of kidney stones   . HLD (hyperlipidemia)   . HTN (hypertension)   . Hypothyroidism   . Irritable bowel syndrome (IBS)   . Median arcuate ligament syndrome ([place])   . PVD (peripheral vascular disease) ([place])   . Tubular adenoma of colon 08/2013   with focal high grade dysplasia   Past Surgical History:  Procedure Laterality Date  . ABDOMINAL HYSTERECTOMY    . APPENDECTOMY    . BIOPSY THYROID    . CHOLECYSTECTOMY N/A 10/24/2014   Procedure: MICROSCOPIC  CHOLECYSTECTOMY;  Surgeon: Ralene Ok, MD;  Location: [facility];   Service: General;  Laterality: N/A;  . CORONARY ARTERY BYPASS GRAFT  07  . ESOPHAGEAL MANOMETRY N/A 08/02/2012   Procedure: ESOPHAGEAL MANOMETRY (EM);  Surgeon: Sable Feil, MD;  [facility];  Service: Endoscopy;  Laterality: N/A;  . THYROID LOBECTOMY      reports that she has never smoked. She has never used smokeless tobacco. She reports that she does not drink alcohol or use drugs. family history includes Breast cancer in her other; Cholelithiasis in her mother; Colon cancer in her brother; Diabetes in her sister; Heart disease in her mother; Lung cancer in her father and sister; Prostate cancer in her brother; Stomach cancer in her cousin. Allergies  Allergen Reactions  . Actos [Pioglitazone] Swelling    Swelling  . Sulfonamide Derivatives Swelling  . Codeine Nausea And Vomiting     Review of Systems  Constitutional: Positive for fatigue. Negative for chills and fever.  Respiratory: Negative for shortness of breath.   Cardiovascular: Negative for chest pain.  Genitourinary: Positive for frequency. Negative for decreased urine volume, flank pain, hematuria and vaginal pain.  Musculoskeletal: Positive for back pain.  Neurological: Negative for weakness and numbness.       Objective:   Physical Exam  Constitutional: She appears well-developed and well-nourished.  Cardiovascular: Normal rate and regular rhythm.   Pulmonary/Chest: Effort normal and breath sounds normal. No respiratory distress.  She has no wheezes. She has no rales.  Musculoskeletal:  No spinal tenderness. Straight leg raises are negative bilaterally  Neurological:  refelxes are symmetric knee and ankle bilaterally. She is generally weak throughout with dorsiflexion, plantarflexion, and knee extension. No sensory loss       Assessment:     #1 bilateral lumbar back pain with nonfocal neuro exam  #2 dysuria    Plan:     -Check urinalysis= small leukocytes and trace blood. -Set up physical  therapy regarding her somewhat chronic low back pain. She has some generalized weakness and may benefit from strengthening and stretching exercises -Refill tramadol 1 every 6 hours as needed for severe pain #40  [name] MD [facility]

## 2016-08-15 NOTE — Progress Notes (Signed)
Pre visit review using our clinic review tool, if applicable. No additional management support is needed unless otherwise documented below in the visit note. 

## 2016-08-17 LAB — URINE CULTURE

## 2016-08-18 ENCOUNTER — Other Ambulatory Visit: Payer: Self-pay | Admitting: Family Medicine

## 2016-08-18 MED ORDER — CEPHALEXIN 500 MG PO CAPS: 500.0000 mg | ORAL_CAPSULE | Freq: Four times a day (QID) | ORAL | 0 refills | Status: DC

## 2016-08-19 ENCOUNTER — Ambulatory Visit: Payer: Medicare Other | Attending: Family Medicine | Admitting: Physical Therapy

## 2016-08-19 ENCOUNTER — Encounter: Payer: Self-pay | Admitting: Physical Therapy

## 2016-08-19 ENCOUNTER — Telehealth: Payer: Self-pay | Admitting: Family Medicine

## 2016-08-19 DIAGNOSIS — M62830 Muscle spasm of back: Secondary | ICD-10-CM | POA: Diagnosis not present

## 2016-08-19 DIAGNOSIS — M6281 Muscle weakness (generalized): Secondary | ICD-10-CM | POA: Diagnosis not present

## 2016-08-19 DIAGNOSIS — M545 Low back pain, unspecified: Secondary | ICD-10-CM

## 2016-08-19 DIAGNOSIS — G8929 Other chronic pain: Secondary | ICD-10-CM | POA: Insufficient documentation

## 2016-08-19 NOTE — Telephone Encounter (Signed)
Received a call from [facility].  They received Keflex prescription at 500 mg to take qid #14.  Pharmacy thought tablet amount was incorrect.  Looked at urine culture from 08/15/16.  Dr. Sharmaine Base directions are take 500 mg tid for seven days.  Amount should be #21.  Pharmacist notified and stated they will call the pt to notify her how to take correctly.  They will give her additional amount of 7 tabs as the pt was given #14 earlier today.

## 2016-08-19 NOTE — Therapy (Signed)
[place], [place], [address] Phone: [phone]   Fax:  [phone]  Physical Therapy Evaluation  Patient Details  Name: [name] MRN: [id number] Date of Birth: 1933-02-27 Referring Provider: Dr Carolann Littler  Encounter Date: 08/19/2016      PT End of Session - 08/19/16 1159    Visit Number 1   Number of Visits 16   Date for PT Re-Evaluation 10/14/16   Authorization Type [employer] Medicare complete   PT Start Time 1050   PT Stop Time 1143   PT Time Calculation (min) 53 min   Activity Tolerance Patient tolerated treatment well   Behavior During Therapy [facility] for tasks assessed/performed      Past Medical History:  Diagnosis Date  . Anemia, pernicious   . Arthritis    bursitis   . Atrophic gastritis   . B12 deficiency   . CAD (coronary artery disease)   . Carotid artery stenosis   . Diarrhea    on and off for months  . Family history of adverse reaction to anesthesia    extreme nausea brother and sister  . Fibromyalgia   . Gallbladder polyp   . GERD (gastroesophageal reflux disease)   . Heart murmur   . History of hiatal hernia   . History of kidney stones   . HLD (hyperlipidemia)   . HTN (hypertension)   . Hypothyroidism   . Irritable bowel syndrome (IBS)   . Median arcuate ligament syndrome ([place])   . PVD (peripheral vascular disease) ([place])   . Tubular adenoma of colon 08/2013   with focal high grade dysplasia    Past Surgical History:  Procedure Laterality Date  . ABDOMINAL HYSTERECTOMY    . APPENDECTOMY    . BIOPSY THYROID    . CHOLECYSTECTOMY N/A 10/24/2014   Procedure: MICROSCOPIC  CHOLECYSTECTOMY;  Surgeon: Ralene Ok, MD;  Location: [facility];  Service: General;  Laterality: N/A;  . CORONARY ARTERY BYPASS GRAFT  07  . ESOPHAGEAL MANOMETRY N/A 08/02/2012   Procedure: ESOPHAGEAL MANOMETRY (EM);  Surgeon: Sable Feil, MD;  [facility];  Service: Endoscopy;   Laterality: N/A;  . THYROID LOBECTOMY      There were no vitals filed for this visit.       Subjective Assessment - 08/19/16 1100    Subjective Patient has a history of lower back pain. Over the past 2-3 months she has been having increased pain when she is sleeping. She has consistent pain during the day but it increases at night. She has had vascular problems on her right leg. She has had swelling on her right side.    Limitations Standing;Walking  lying in bed.   How long can you sit comfortably? No limit    How long can you stand comfortably? < 10 minutes standing    How long can you walk comfortably? Limited distances before she starts to have pain    Diagnostic tests Nothing recently    Patient Stated Goals to have less pain   Currently in Pain? Yes   Pain Score 7    Pain Location Back   Pain Orientation Mid   Pain Descriptors / Indicators Aching   Pain Type Chronic pain   Pain Radiating Towards nothing at this time but the pain can run into the legs    Pain Onset More than a month ago   Pain Frequency Constant   Aggravating Factors  standing, sleeping,  Pain Relieving Factors sitting in a certain psoition    Effect of Pain on Daily Activities difficulty sleeping             [facility] PT Assessment - 08/19/16 0001      Assessment   Medical Diagnosis Low back pain    Referring Provider Dr Carolann Littler   Onset Date/Surgical Date --  Chronic but increased over the past 2-3 months    Next MD Visit Nothing scheduled    Prior Therapy Several years ago for her lower back.      Precautions   Precautions None     Restrictions   Weight Bearing Restrictions No     Balance Screen   Has the patient fallen in the past 6 months No   Has the patient had a decrease in activity level because of a fear of falling?  No   Is the patient reluctant to leave their home because of a fear of falling?  No     Home Environment   Additional Comments Has stairs in her hosue but she  does not have to use them. Has somedifficulty going down. Lives with her husband      Prior Function   Level of Independence Independent   Leisure walking, tredmill, bike, goingto the gym Gardening      Cognition   Overall Cognitive Status Within Functional Limits for tasks assessed   Attention Focused   Focused Attention Appears intact   Memory Appears intact   Awareness Appears intact   Problem Solving Appears intact   Executive Function --     Observation/Other Assessments   Observations flat lumbar lordosis    Focus on Therapeutic Outcomes (FOTO)  47% limitation      Sensation   Additional Comments Sciatica      Coordination   Gross Motor Movements are Fluid and Coordinated Yes   Fine Motor Movements are Fluid and Coordinated Yes     Posture/Postural Control   Posture Comments rounded shoulders      ROM / Strength   AROM / PROM / Strength AROM;Strength;PROM     AROM   AROM Assessment Site Cervical;Lumbar   Lumbar Flexion limited 75% with pain    Lumbar Extension limited 75%    Lumbar - Right Side Bend Pain but normal limits    Lumbar - Left Side Bend Pain but normal limits    Lumbar - Right Rotation pain with rotation    Lumbar - Left Rotation Pain with rotation      PROM   Overall PROM Comments pain with external and internal roation    PROM Assessment Site Hip   Right/Left Hip Right;Left   Right Hip External Rotation  18   Left Hip Flexion 110 with pain    Left Hip External Rotation  10     Strength   Overall Strength Comments fair core contraction    Strength Assessment Site Knee;Hip     Right Hip   Right Hip Flexion --  110 with pain      Flexibility   Soft Tissue Assessment /Muscle Length yes   Hamstrings 90/90 30 degrees bilateral with pain      Palpation   Spinal mobility Limited pA movement of the lumbar and thoracic spine    Palpation comment Spasming in the bilateral lumbar paraspinals      Special Tests    Special Tests --  SLRI (-)  bilateral      Transfers   Comments  Patient requires a few seconds to straighten out after standing.      Ambulation/Gait   Gait Comments decreased hip rotation with gait                    [facility] Adult PT Treatment/Exercise - 08/19/16 0001      Lumbar Exercises: Stretches   Passive Hamstring Stretch Limitations setad 2x30sec holds bilateral with cuing    Lower Trunk Rotation Limitations x10 to improve ER/IR    Piriformis Stretch Limitations 2x20sec bilateral to rduce gluteal spasming      Lumbar Exercises: Supine   Heel Slides Limitations x10 each with abdominal bracing    Bent Knee Raise Limitations x10 each with abdominal bracing                 PT Education - 08/19/16 1158    Education provided Yes   Education Details HEP, symptom mangement, impotance of strengthening    Person(s) Educated Patient   Methods Explanation;Demonstration;Tactile cues;Verbal cues   Comprehension Verbalized understanding;Returned demonstration;Tactile cues required;Verbal cues required          PT Short Term Goals - 08/19/16 1215      PT SHORT TERM GOAL #1   Title Patient will be independent with inital HEP    Time 4   Period Weeks   Status New     PT SHORT TERM GOAL #2   Title Patient will increase gorss right LE strength to 4+/5    Time 4   Period Weeks   Status New     PT SHORT TERM GOAL #3   Title Patient will demsotrate WNL hip internal and external rotation    Time 4   Period Weeks   Status New           PT Long Term Goals - 08/19/16 1216      PT LONG TERM GOAL #1   Title Patient will return to the gym without a significant increase in pain    Time 8   Period Weeks   Status New     PT LONG TERM GOAL #2   Title Patient will bend down to the ground without pain in order to perfrom ADL's    Time 8   Period Weeks   Status New     PT LONG TERM GOAL #3   Title Patient will sleep 4-6 hours straight without increased pain    Time 8   Period Weeks    Status New     PT LONG TERM GOAL #4   Title Patient will demonstrate a 42% limitation on FOTO    Time 8   Period Weeks   Status New               Plan - 08/19/16 1203    Clinical Impression Statement Patient is an 83 year old female with low back pain. Signs and symptoms are consistnet with the diagnosis of lumbar spinal stenoisis. She has significant spasming in her lower back. She has limited mobility of her spine. She is already doing some stretching at home. She has tightness in her hips. She also has hip flexion and abfduction weakness R > L. She is very motivated to decrease her pain and return to her active lifestyle. she would benefit from furhter skilled therapy to adress the above deficits. She was seen for a low comlexity evaluation.    Rehab Potential Good   PT Frequency 2x / week   PT Duration  8 weeks   PT Treatment/Interventions ADLs/Self Care Home Management;Cryotherapy;Electrical Stimulation;Iontophoresis 4mg /ml Dexamethasone;Traction;Ultrasound;Gait training;Stair training;Functional mobility training;Patient/family education;Therapeutic exercise;Therapeutic activities;Neuromuscular re-education;Manual techniques;Passive range of motion;Taping   PT Next Visit Plan Patient also has some upper back pain review scapular sttrnegthening/ postural exercises extension; retraction; Reviewe clamshells; Consider low bridges if she can tolerate them; Review HEP; light gross lumbar mobilization. Keep in mid she does have osteoperosis.    PT Home Exercise Plan supine hip flexion; supine heel slide, lateral trunk rotation, piriformis stretch; hamstring stretch    Consulted and Agree with Plan of Care Patient      Patient will benefit from skilled therapeutic intervention in order to improve the following deficits and impairments:  Decreased range of motion, Decreased endurance, Decreased safety awareness, Decreased strength, Impaired sensation, Pain, Increased muscle spasms,  Increased fascial restricitons, Abnormal gait, Postural dysfunction  Visit Diagnosis: Chronic bilateral low back pain without sciatica - Plan: PT plan of care cert/re-cert  Muscle spasm of back - Plan: PT plan of care cert/re-cert  Muscle weakness (generalized) - Plan: PT plan of care cert/re-cert      G-Codes - 03/52/48 1531    Functional Assessment Tool Used (Outpatient Only) FOTO, clinical decision making    Functional Limitation Changing and maintaining body position   Changing and Maintaining Body Position Current Status (L8590) At least 40 percent but less than 60 percent impaired, limited or restricted   Changing and Maintaining Body Position Goal Status (B3112) At least 20 percent but less than 40 percent impaired, limited or restricted       Problem List Patient Active Problem List   Diagnosis Date Noted  . Loss of weight 11/18/2013  . Nausea alone 11/18/2013  . Abdominal pain, epigastric 11/18/2013  . Anemia, iron deficiency 01/27/2013  . Temporomandibular joint-pain-dysfunction syndrome (TMJ) 06/28/2012  . Other abnormal glucose 08/25/2011  . Allergic rhinitis, cause unspecified 08/25/2011  . Right-sided chest wall pain 08/25/2011  . Murmur 09/20/2010  . Incisional hernia 09/20/2010  . HERNIATED DISC 01/02/2010  . CAROTID ARTERY DISEASE 08/27/2009  . OSTEOPOROSIS 06/07/2009  . BRADYCARDIA 02/05/2009  . BENIGN POSITIONAL VERTIGO 09/27/2008  . CELIAC ARTERY COMPRESSION SYNDROME 10/25/2007  . HYPOTHYROIDISM 01/08/2007  . Hyperlipidemia 01/08/2007  . Essential hypertension 01/08/2007  . Coronary atherosclerosis 01/08/2007  . PERIPHERAL VASCULAR DISEASE 01/08/2007  . ATROPHIC GASTRITIS 01/08/2007  . ANEMIA, PERNICIOUS, HX OF 01/08/2007  . RENAL CALCULUS, HX OF 01/08/2007  . HYSTERECTOMY, HX OF 01/08/2007  . APPENDECTOMY, HX OF 01/08/2007    Carney Living  PT DPT  08/19/2016, 4:15 PM  [place], [place], [address] Phone: [phone]   Fax:  [phone]  Name: [name] MRN: [id number] Date of Birth: Sep 22, 1932

## 2016-08-27 ENCOUNTER — Ambulatory Visit: Payer: Medicare Other | Admitting: Physical Therapy

## 2016-08-28 ENCOUNTER — Ambulatory Visit: Payer: Medicare Other | Admitting: Physical Therapy

## 2016-09-01 ENCOUNTER — Ambulatory Visit: Payer: Medicare Other | Attending: Family Medicine | Admitting: Physical Therapy

## 2016-09-01 ENCOUNTER — Encounter: Payer: Self-pay | Admitting: Physical Therapy

## 2016-09-01 ENCOUNTER — Ambulatory Visit (INDEPENDENT_AMBULATORY_CARE_PROVIDER_SITE_OTHER): Payer: Medicare Other | Admitting: Family Medicine

## 2016-09-01 DIAGNOSIS — M6281 Muscle weakness (generalized): Secondary | ICD-10-CM

## 2016-09-01 DIAGNOSIS — E538 Deficiency of other specified B group vitamins: Secondary | ICD-10-CM

## 2016-09-01 DIAGNOSIS — M62830 Muscle spasm of back: Secondary | ICD-10-CM | POA: Diagnosis not present

## 2016-09-01 DIAGNOSIS — G8929 Other chronic pain: Secondary | ICD-10-CM | POA: Insufficient documentation

## 2016-09-01 DIAGNOSIS — M545 Low back pain, unspecified: Secondary | ICD-10-CM

## 2016-09-01 MED ORDER — CYANOCOBALAMIN 1000 MCG/ML IJ SOLN
1000.0000 ug | Freq: Once | INTRAMUSCULAR | Status: AC
  Administered 2016-09-01: 1000 ug via INTRAMUSCULAR

## 2016-09-01 NOTE — Therapy (Signed)
[place], [place], [address] Phone: [phone]   Fax:  [phone]  Physical Therapy Treatment  Patient Details  Name: [name] MRN: [id number] Date of Birth: May 11, 1932 Referring Provider: Dr Carolann Littler  Encounter Date: 09/01/2016      PT End of Session - 09/01/16 1427    Visit Number 2   Number of Visits 16   Date for PT Re-Evaluation 10/14/16   Authorization Type UHC Medicare complete   PT Start Time 6269   PT Stop Time 1457   PT Time Calculation (min) 42 min   Activity Tolerance Patient tolerated treatment well   Behavior During Therapy [facility] for tasks assessed/performed      Past Medical History:  Diagnosis Date  . Anemia, pernicious   . Arthritis    bursitis   . Atrophic gastritis   . B12 deficiency   . CAD (coronary artery disease)   . Carotid artery stenosis   . Diarrhea    on and off for months  . Family history of adverse reaction to anesthesia    extreme nausea brother and sister  . Fibromyalgia   . Gallbladder polyp   . GERD (gastroesophageal reflux disease)   . Heart murmur   . History of hiatal hernia   . History of kidney stones   . HLD (hyperlipidemia)   . HTN (hypertension)   . Hypothyroidism   . Irritable bowel syndrome (IBS)   . Median arcuate ligament syndrome ([place])   . PVD (peripheral vascular disease) ([place])   . Tubular adenoma of colon 08/2013   with focal high grade dysplasia    Past Surgical History:  Procedure Laterality Date  . ABDOMINAL HYSTERECTOMY    . APPENDECTOMY    . BIOPSY THYROID    . CHOLECYSTECTOMY N/A 10/24/2014   Procedure: MICROSCOPIC  CHOLECYSTECTOMY;  Surgeon: Ralene Ok, MD;  Location: [place];  Service: General;  Laterality: N/A;  . CORONARY ARTERY BYPASS GRAFT  07  . ESOPHAGEAL MANOMETRY N/A 08/02/2012   Procedure: ESOPHAGEAL MANOMETRY (EM);  Surgeon: Sable Feil, MD;  [facility];  Service: Endoscopy;   Laterality: N/A;  . THYROID LOBECTOMY      There were no vitals filed for this visit.      Subjective Assessment - 09/01/16 1421    Subjective Patient reports she has been sleeping better at night but overall she does not feel too much diffierence. She has been doing her exercises and stretches.    Limitations Standing;Walking   How long can you sit comfortably? No limit    How long can you stand comfortably? < 10 minutes standing    How long can you walk comfortably? Limited distances before she starts to have pain    Diagnostic tests Nothing recently    Currently in Pain? Yes   Pain Score 2    Pain Location Back   Pain Orientation Mid   Pain Descriptors / Indicators Aching   Pain Type Chronic pain   Pain Onset More than a month ago   Pain Frequency Constant   Aggravating Factors  standing and sleeping    Pain Relieving Factors sitting in certain positions    Effect of Pain on Daily Activities difficulty sleeping                                  PT Education - 09/01/16 1426  Education provided Yes   Education Details HEP symtpom management    Person(s) Educated Patient   Methods Explanation;Demonstration;Tactile cues;Verbal cues   Comprehension Returned demonstration;Verbalized understanding;Verbal cues required          PT Short Term Goals - 08/19/16 1215      PT SHORT TERM GOAL #1   Title Patient will be independent with inital HEP    Time 4   Period Weeks   Status New     PT SHORT TERM GOAL #2   Title Patient will increase gorss right LE strength to 4+/5    Time 4   Period Weeks   Status New     PT SHORT TERM GOAL #3   Title Patient will demsotrate WNL hip internal and external rotation    Time 4   Period Weeks   Status New           PT Long Term Goals - 08/19/16 1216      PT LONG TERM GOAL #1   Title Patient will return to the gym without a significant increase in pain    Time 8   Period Weeks   Status New     PT  LONG TERM GOAL #2   Title Patient will bend down to the ground without pain in order to perfrom ADL's    Time 8   Period Weeks   Status New     PT LONG TERM GOAL #3   Title Patient will sleep 4-6 hours straight without increased pain    Time 8   Period Weeks   Status New     PT LONG TERM GOAL #4   Title Patient will demonstrate a 42% limitation on FOTO    Time 8   Period Weeks   Status New               Plan - 09/01/16 1708    Clinical Impression Statement Patient tolerated treatment well. Therapy reviewed exercises for her thoriacic spine and posture to reduce pain and help posture to reduce effects of osteoperosis. She will come back to review exercises next week.    Rehab Potential Good   PT Frequency 2x / week   PT Duration 8 weeks   PT Treatment/Interventions ADLs/Self Care Home Management;Cryotherapy;Electrical Stimulation;Iontophoresis 4mg /ml Dexamethasone;Traction;Ultrasound;Gait training;Stair training;Functional mobility training;Patient/family education;Therapeutic exercise;Therapeutic activities;Neuromuscular re-education;Manual techniques;Passive range of motion;Taping   PT Next Visit Plan Patient also has some upper back pain review scapular sttrnegthening/ postural exercises extension; retraction; Reviewe clamshells; Consider low bridges if she can tolerate them; Review HEP; light gross lumbar mobilization. Keep in mid she does have osteoperosis.    PT Home Exercise Plan supine hip flexion; supine heel slide, lateral trunk rotation, piriformis stretch; hamstring stretch    Consulted and Agree with Plan of Care Patient      Patient will benefit from skilled therapeutic intervention in order to improve the following deficits and impairments:  Decreased range of motion, Decreased endurance, Decreased safety awareness, Decreased strength, Impaired sensation, Pain, Increased muscle spasms, Increased fascial restricitons, Abnormal gait, Postural dysfunction  Visit  Diagnosis: Chronic bilateral low back pain without sciatica  Muscle spasm of back  Muscle weakness (generalized)     Problem List Patient Active Problem List   Diagnosis Date Noted  . Loss of weight 11/18/2013  . Nausea alone 11/18/2013  . Abdominal pain, epigastric 11/18/2013  . Anemia, iron deficiency 01/27/2013  . Temporomandibular joint-pain-dysfunction syndrome (TMJ) 06/28/2012  . Other abnormal glucose 08/25/2011  .  Allergic rhinitis, cause unspecified 08/25/2011  . Right-sided chest wall pain 08/25/2011  . Murmur 09/20/2010  . Incisional hernia 09/20/2010  . HERNIATED DISC 01/02/2010  . CAROTID ARTERY DISEASE 08/27/2009  . OSTEOPOROSIS 06/07/2009  . BRADYCARDIA 02/05/2009  . BENIGN POSITIONAL VERTIGO 09/27/2008  . CELIAC ARTERY COMPRESSION SYNDROME 10/25/2007  . HYPOTHYROIDISM 01/08/2007  . Hyperlipidemia 01/08/2007  . Essential hypertension 01/08/2007  . Coronary atherosclerosis 01/08/2007  . PERIPHERAL VASCULAR DISEASE 01/08/2007  . ATROPHIC GASTRITIS 01/08/2007  . ANEMIA, PERNICIOUS, HX OF 01/08/2007  . RENAL CALCULUS, HX OF 01/08/2007  . HYSTERECTOMY, HX OF 01/08/2007  . APPENDECTOMY, HX OF 01/08/2007    Carney Living PT DPT  09/01/2016, 5:13 PM  [facility] [address] [place], [place], [address] Phone: [phone]   Fax:  [phone]  Name: SMT LOKEY MRN: [id number] Date of Birth: 04/09/1933

## 2016-09-02 ENCOUNTER — Ambulatory Visit: Payer: Medicare Other

## 2016-09-09 ENCOUNTER — Ambulatory Visit: Payer: Medicare Other | Admitting: Physical Therapy

## 2016-09-16 ENCOUNTER — Ambulatory Visit: Payer: Medicare Other | Admitting: Podiatry

## 2016-09-18 ENCOUNTER — Encounter: Payer: Self-pay | Admitting: Physical Therapy

## 2016-09-18 ENCOUNTER — Ambulatory Visit: Payer: Medicare Other | Admitting: Physical Therapy

## 2016-09-18 DIAGNOSIS — M545 Low back pain, unspecified: Secondary | ICD-10-CM

## 2016-09-18 DIAGNOSIS — G8929 Other chronic pain: Secondary | ICD-10-CM | POA: Diagnosis not present

## 2016-09-18 DIAGNOSIS — M6281 Muscle weakness (generalized): Secondary | ICD-10-CM

## 2016-09-18 DIAGNOSIS — M62830 Muscle spasm of back: Secondary | ICD-10-CM | POA: Diagnosis not present

## 2016-09-18 NOTE — Therapy (Signed)
[place], [place], [address] Phone: [phone]   Fax:  [phone]  Physical Therapy Treatment  Patient Details  Name: [name] MRN: [id number] Date of Birth: 1933-02-01 Referring Provider: Dr Carolann Littler  Encounter Date: 09/18/2016      PT End of Session - 09/18/16 1109    Visit Number 3   Number of Visits 16   Date for PT Re-Evaluation 10/14/16   Authorization Type UHC Medicare complete   PT Start Time 1100   PT Stop Time 1144   PT Time Calculation (min) 44 min   Activity Tolerance Patient tolerated treatment well   Behavior During Therapy [facility] for tasks assessed/performed      Past Medical History:  Diagnosis Date  . Anemia, pernicious   . Arthritis    bursitis   . Atrophic gastritis   . B12 deficiency   . CAD (coronary artery disease)   . Carotid artery stenosis   . Diarrhea    on and off for months  . Family history of adverse reaction to anesthesia    extreme nausea brother and sister  . Fibromyalgia   . Gallbladder polyp   . GERD (gastroesophageal reflux disease)   . Heart murmur   . History of hiatal hernia   . History of kidney stones   . HLD (hyperlipidemia)   . HTN (hypertension)   . Hypothyroidism   . Irritable bowel syndrome (IBS)   . Median arcuate ligament syndrome ([place])   . PVD (peripheral vascular disease) ([place])   . Tubular adenoma of colon 08/2013   with focal high grade dysplasia    Past Surgical History:  Procedure Laterality Date  . ABDOMINAL HYSTERECTOMY    . APPENDECTOMY    . BIOPSY THYROID    . CHOLECYSTECTOMY N/A 10/24/2014   Procedure: MICROSCOPIC  CHOLECYSTECTOMY;  Surgeon: Ralene Ok, MD;  Location: [place];  Service: General;  Laterality: N/A;  . CORONARY ARTERY BYPASS GRAFT  07  . ESOPHAGEAL MANOMETRY N/A 08/02/2012   Procedure: ESOPHAGEAL MANOMETRY (EM);  Surgeon: Sable Feil, MD;  [facility];  Service: Endoscopy;   Laterality: N/A;  . THYROID LOBECTOMY      There were no vitals filed for this visit.      Subjective Assessment - 09/18/16 1104    Subjective Patient reports she has been sleeping better at night but overall she does not feel too much diffierence. She has been doing her exercises and stretches.    Limitations Standing;Walking   How long can you sit comfortably? No limit    How long can you stand comfortably? < 10 minutes standing    How long can you walk comfortably? Limited distances before she starts to have pain    Diagnostic tests Nothing recently    Patient Stated Goals to have less pain   Currently in Pain? Yes   Pain Score 2    Pain Location Back   Pain Orientation Mid   Pain Descriptors / Indicators Aching   Pain Type Chronic pain   Pain Radiating Towards nothing at this time   Pain Onset More than a month ago   Pain Frequency Constant   Aggravating Factors  standing and sleeping    Pain Relieving Factors Sitting in certain position    Effect of Pain on Daily Activities difficulty sleeping  [place] Adult PT Treatment/Exercise - 09/18/16 0001      Lumbar Exercises: Stretches   Passive Hamstring Stretch Limitations setad 2x30sec holds bilateral with cuing    Lower Trunk Rotation Limitations x10 to improve ER/IR    Piriformis Stretch Limitations 2x20sec bilateral to rduce gluteal spasming      Lumbar Exercises: Supine   Heel Slides Limitations x10 each with abdominal bracing    Bent Knee Raise Limitations x10 each with abdominal bracing      Manual Therapy   Manual therapy comments Manual distraction of the right hip; Soft tissue mobilization to glut med area as well as lumbar paraspinals.                   PT Short Term Goals - 08/19/16 1215      PT SHORT TERM GOAL #1   Title Patient will be independent with inital HEP    Time 4   Period Weeks   Status New     PT SHORT TERM GOAL #2   Title Patient will  increase gorss right LE strength to 4+/5    Time 4   Period Weeks   Status New     PT SHORT TERM GOAL #3   Title Patient will demsotrate WNL hip internal and external rotation    Time 4   Period Weeks   Status New           PT Long Term Goals - 08/19/16 1216      PT LONG TERM GOAL #1   Title Patient will return to the gym without a significant increase in pain    Time 8   Period Weeks   Status New     PT LONG TERM GOAL #2   Title Patient will bend down to the ground without pain in order to perfrom ADL's    Time 8   Period Weeks   Status New     PT LONG TERM GOAL #3   Title Patient will sleep 4-6 hours straight without increased pain    Time 8   Period Weeks   Status New     PT LONG TERM GOAL #4   Title Patient will demonstrate a 42% limitation on FOTO    Time 8   Period Weeks   Status New               Plan - 09/18/16 1121    Clinical Impression Statement Patient required cuing for technique with her stretches. When done corrrectly she had improved pain and improved movement. Patient encouraged to continue stretching and exercises at home. She was also encouraged to come for 1-2 more visits to xcontinue progression and work on technique. She is somewhat discoruaged that she is not better but she has only been to 1 visit and her eval and is sporadily doing her exercises. She was encouraged to be consitent and patient. FOTO evaulated 2nd to 1 month of treatment. 43% limitation noted. Patient on pace to meet FOTO goal.    Rehab Potential Good   PT Frequency 2x / week   PT Duration 8 weeks   PT Treatment/Interventions ADLs/Self Care Home Management;Cryotherapy;Electrical Stimulation;Iontophoresis 4mg /ml Dexamethasone;Traction;Ultrasound;Gait training;Stair training;Functional mobility training;Patient/family education;Therapeutic exercise;Therapeutic activities;Neuromuscular re-education;Manual techniques;Passive range of motion;Taping   PT Next Visit Plan  Patient also has some upper back pain review scapular sttrnegthening/ postural exercises extension; retraction; Reviewe clamshells; Consider low bridges if she can tolerate them; Review HEP; light gross lumbar mobilization. Keep in mid  she does have osteoperosis.    PT Home Exercise Plan supine hip flexion; supine heel slide, lateral trunk rotation, piriformis stretch; hamstring stretch    Consulted and Agree with Plan of Care Patient      Patient will benefit from skilled therapeutic intervention in order to improve the following deficits and impairments:  Decreased range of motion, Decreased endurance, Decreased safety awareness, Decreased strength, Impaired sensation, Pain, Increased muscle spasms, Increased fascial restricitons, Abnormal gait, Postural dysfunction  Visit Diagnosis: Chronic bilateral low back pain without sciatica  Muscle spasm of back  Muscle weakness (generalized)     Problem List Patient Active Problem List   Diagnosis Date Noted  . Loss of weight 11/18/2013  . Nausea alone 11/18/2013  . Abdominal pain, epigastric 11/18/2013  . Anemia, iron deficiency 01/27/2013  . Temporomandibular joint-pain-dysfunction syndrome (TMJ) 06/28/2012  . Other abnormal glucose 08/25/2011  . Allergic rhinitis, cause unspecified 08/25/2011  . Right-sided chest wall pain 08/25/2011  . Murmur 09/20/2010  . Incisional hernia 09/20/2010  . HERNIATED DISC 01/02/2010  . CAROTID ARTERY DISEASE 08/27/2009  . OSTEOPOROSIS 06/07/2009  . BRADYCARDIA 02/05/2009  . BENIGN POSITIONAL VERTIGO 09/27/2008  . CELIAC ARTERY COMPRESSION SYNDROME 10/25/2007  . HYPOTHYROIDISM 01/08/2007  . Hyperlipidemia 01/08/2007  . Essential hypertension 01/08/2007  . Coronary atherosclerosis 01/08/2007  . PERIPHERAL VASCULAR DISEASE 01/08/2007  . ATROPHIC GASTRITIS 01/08/2007  . ANEMIA, PERNICIOUS, HX OF 01/08/2007  . RENAL CALCULUS, HX OF 01/08/2007  . HYSTERECTOMY, HX OF 01/08/2007  . APPENDECTOMY, HX  OF 01/08/2007    Carney Living PT DPT  09/18/2016, 3:19 PM  [facility] [address] [place], [place], [address] Phone: [phone]   Fax:  [phone]  Name: NEFERTITI MOHAMAD MRN: [id number] Date of Birth: Apr 10, 1933

## 2016-09-23 ENCOUNTER — Encounter: Payer: Self-pay | Admitting: Podiatry

## 2016-09-23 ENCOUNTER — Ambulatory Visit: Payer: Medicare Other | Admitting: Physical Therapy

## 2016-09-23 ENCOUNTER — Ambulatory Visit (INDEPENDENT_AMBULATORY_CARE_PROVIDER_SITE_OTHER): Payer: Medicare Other | Admitting: Podiatry

## 2016-09-23 DIAGNOSIS — M62830 Muscle spasm of back: Secondary | ICD-10-CM | POA: Diagnosis not present

## 2016-09-23 DIAGNOSIS — L600 Ingrowing nail: Secondary | ICD-10-CM | POA: Diagnosis not present

## 2016-09-23 DIAGNOSIS — B351 Tinea unguium: Secondary | ICD-10-CM

## 2016-09-23 DIAGNOSIS — M545 Low back pain, unspecified: Secondary | ICD-10-CM

## 2016-09-23 DIAGNOSIS — G8929 Other chronic pain: Secondary | ICD-10-CM | POA: Diagnosis not present

## 2016-09-23 DIAGNOSIS — M79671 Pain in right foot: Secondary | ICD-10-CM | POA: Diagnosis not present

## 2016-09-23 DIAGNOSIS — M6281 Muscle weakness (generalized): Secondary | ICD-10-CM

## 2016-09-23 DIAGNOSIS — M79672 Pain in left foot: Secondary | ICD-10-CM | POA: Diagnosis not present

## 2016-09-23 NOTE — Patient Instructions (Signed)
Seen for hypertrophic nails. All nails debrided. Return in 3 months or as needed.  

## 2016-09-23 NOTE — Progress Notes (Signed)
SUBJECTIVE: 83 y.o.year old femalepresents with painful thick toe nails. Has a poor eye sight and having difficulty seen her nails.  OBJECTIVE: DERMATOLOGIC EXAMINATION: Hypertrophic nails x 10. Thick dystrophic nail devitalized right great toe at nail bed.  No open skin lesions.   VASCULAR EXAMINATION OF LOWER LIMBS: All pedal pulses are palpable with normal pulsation.  Capillary Filling times within 3 seconds in all digits.  Both feet are cold to touch.   NEUROLOGIC EXAMINATION OF THE LOWER LIMBS: All epicritic and tactile sensations grossly intactl.   MUSCULOSKELETAL EXAMINATION: No gross deformities noted.   ASSESSMENT: Mycotic nails and deformed right great toe nail.  Painful feet.  PLAN: Reviewed findings and available treatment options. All nails debrided.  Return in 3 month for routine foot care.

## 2016-09-24 ENCOUNTER — Encounter: Payer: Self-pay | Admitting: Physical Therapy

## 2016-09-24 NOTE — Therapy (Signed)
[place], [place], [address] Phone: [phone]   Fax:  [phone]  Physical Therapy Treatment  Patient Details  Name: [name] MRN: [id number] Date of Birth: 02/09/1933 Referring Provider: Dr Carolann Littler  Encounter Date: 09/23/2016      PT End of Session - 09/24/16 0757    Visit Number 4   Number of Visits 16   Date for PT Re-Evaluation 10/14/16   Authorization Type UHC Medicare complete   PT Start Time 1104   PT Stop Time 1145   PT Time Calculation (min) 41 min   Activity Tolerance Patient tolerated treatment well   Behavior During Therapy [facility] for tasks assessed/performed      Past Medical History:  Diagnosis Date  . Anemia, pernicious   . Arthritis    bursitis   . Atrophic gastritis   . B12 deficiency   . CAD (coronary artery disease)   . Carotid artery stenosis   . Diarrhea    on and off for months  . Family history of adverse reaction to anesthesia    extreme nausea brother and sister  . Fibromyalgia   . Gallbladder polyp   . GERD (gastroesophageal reflux disease)   . Heart murmur   . History of hiatal hernia   . History of kidney stones   . HLD (hyperlipidemia)   . HTN (hypertension)   . Hypothyroidism   . Irritable bowel syndrome (IBS)   . Median arcuate ligament syndrome ([place])   . PVD (peripheral vascular disease) ([place])   . Tubular adenoma of colon 08/2013   with focal high grade dysplasia    Past Surgical History:  Procedure Laterality Date  . ABDOMINAL HYSTERECTOMY    . APPENDECTOMY    . BIOPSY THYROID    . CHOLECYSTECTOMY N/A 10/24/2014   Procedure: MICROSCOPIC  CHOLECYSTECTOMY;  Surgeon: Ralene Ok, MD;  Location: [facility];  Service: General;  Laterality: N/A;  . CORONARY ARTERY BYPASS GRAFT  07  . ESOPHAGEAL MANOMETRY N/A 08/02/2012   Procedure: ESOPHAGEAL MANOMETRY (EM);  Surgeon: Sable Feil, MD;  [facility];  Service: Endoscopy;   Laterality: N/A;  . THYROID LOBECTOMY      There were no vitals filed for this visit.      Subjective Assessment - 09/24/16 0751    Subjective Patrient reports her back still hurts when she does things like gardening. She is sleeping better and she feels like overall she is able to do more activity. She has a full exercise program.    Limitations Standing;Walking   How long can you sit comfortably? No limit    How long can you stand comfortably? < 10 minutes standing    How long can you walk comfortably? Limited distances before she starts to have pain    Diagnostic tests Nothing recently    Patient Stated Goals to have less pain   Currently in Pain? Yes   Pain Score 3    Pain Location Back   Pain Orientation Mid   Pain Descriptors / Indicators Aching   Pain Type Chronic pain   Pain Onset More than a month ago   Pain Frequency Constant   Aggravating Factors  standing and sleeping    Pain Relieving Factors sitting in certain positintions, gardening    Effect of Pain on Daily Activities diffilutly slpeeping  PT Education - 09/24/16 0756    Education provided Yes   Education Details Continue exercises, continue to work on Editor, commissioning) Educated Patient   Methods Explanation;Demonstration;Tactile cues;Verbal cues   Comprehension Verbalized understanding;Verbal cues required;Returned demonstration          PT Short Term Goals - 08/19/16 1215      PT SHORT TERM GOAL #1   Title Patient will be independent with inital HEP    Time 4   Period Weeks   Status New     PT SHORT TERM GOAL #2   Title Patient will increase gorss right LE strength to 4+/5    Time 4   Period Weeks   Status New     PT SHORT TERM GOAL #3   Title Patient will demsotrate WNL hip internal and external rotation    Time 4   Period Weeks   Status New           PT Long Term Goals - 08/19/16 1216      PT LONG TERM GOAL #1    Title Patient will return to the gym without a significant increase in pain    Time 8   Period Weeks   Status New     PT LONG TERM GOAL #2   Title Patient will bend down to the ground without pain in order to perfrom ADL's    Time 8   Period Weeks   Status New     PT LONG TERM GOAL #3   Title Patient will sleep 4-6 hours straight without increased pain    Time 8   Period Weeks   Status New     PT LONG TERM GOAL #4   Title Patient will demonstrate a 42% limitation on FOTO    Time 8   Period Weeks   Status New               Plan - 09/24/16 5361    Clinical Impression Statement Patient demsotrated improved technique with her stretches. She perfromed upper extremity exercises with min cuing. She was encourged to continue with her exercises to build strength.    Rehab Potential Good   PT Frequency 2x / week   PT Duration 8 weeks   PT Treatment/Interventions ADLs/Self Care Home Management;Cryotherapy;Electrical Stimulation;Iontophoresis 4mg /ml Dexamethasone;Traction;Ultrasound;Gait training;Stair training;Functional mobility training;Patient/family education;Therapeutic exercise;Therapeutic activities;Neuromuscular re-education;Manual techniques;Passive range of motion;Taping   PT Next Visit Plan Patient also has some upper back pain review scapular sttrnegthening/ postural exercises extension; retraction; Reviewe clamshells; Consider low bridges if she can tolerate them; Review HEP; light gross lumbar mobilization. Keep in mid she does have osteoperosis.    PT Home Exercise Plan supine hip flexion; supine heel slide, lateral trunk rotation, piriformis stretch; hamstring stretch    Consulted and Agree with Plan of Care Patient      Patient will benefit from skilled therapeutic intervention in order to improve the following deficits and impairments:  Decreased range of motion, Decreased endurance, Decreased safety awareness, Decreased strength, Impaired sensation, Pain,  Increased muscle spasms, Increased fascial restricitons, Abnormal gait, Postural dysfunction  Visit Diagnosis: Chronic bilateral low back pain without sciatica  Muscle spasm of back  Muscle weakness (generalized)     Problem List Patient Active Problem List   Diagnosis Date Noted  . Loss of weight 11/18/2013  . Nausea alone 11/18/2013  . Abdominal pain, epigastric 11/18/2013  . Anemia, iron deficiency 01/27/2013  . Temporomandibular joint-pain-dysfunction syndrome (TMJ) 06/28/2012  .  Other abnormal glucose 08/25/2011  . Allergic rhinitis, cause unspecified 08/25/2011  . Right-sided chest wall pain 08/25/2011  . Murmur 09/20/2010  . Incisional hernia 09/20/2010  . HERNIATED DISC 01/02/2010  . CAROTID ARTERY DISEASE 08/27/2009  . OSTEOPOROSIS 06/07/2009  . BRADYCARDIA 02/05/2009  . BENIGN POSITIONAL VERTIGO 09/27/2008  . CELIAC ARTERY COMPRESSION SYNDROME 10/25/2007  . HYPOTHYROIDISM 01/08/2007  . Hyperlipidemia 01/08/2007  . Essential hypertension 01/08/2007  . Coronary atherosclerosis 01/08/2007  . PERIPHERAL VASCULAR DISEASE 01/08/2007  . ATROPHIC GASTRITIS 01/08/2007  . ANEMIA, PERNICIOUS, HX OF 01/08/2007  . RENAL CALCULUS, HX OF 01/08/2007  . HYSTERECTOMY, HX OF 01/08/2007  . APPENDECTOMY, HX OF 01/08/2007    Carney Living PT DPT  09/24/2016, 8:02 AM  [facility] [address] [place], [place], [address] Phone: [phone]   Fax:  [phone]  Name: [name] MRN: [id number] Date of Birth: 03/02/1933

## 2016-09-30 ENCOUNTER — Ambulatory Visit: Payer: Medicare Other | Attending: Family Medicine | Admitting: Physical Therapy

## 2016-09-30 ENCOUNTER — Encounter: Payer: Self-pay | Admitting: Physical Therapy

## 2016-09-30 DIAGNOSIS — M545 Low back pain, unspecified: Secondary | ICD-10-CM

## 2016-09-30 DIAGNOSIS — M6281 Muscle weakness (generalized): Secondary | ICD-10-CM | POA: Insufficient documentation

## 2016-09-30 DIAGNOSIS — G8929 Other chronic pain: Secondary | ICD-10-CM | POA: Diagnosis not present

## 2016-09-30 DIAGNOSIS — M62830 Muscle spasm of back: Secondary | ICD-10-CM | POA: Insufficient documentation

## 2016-10-01 DIAGNOSIS — M62830 Muscle spasm of back: Secondary | ICD-10-CM | POA: Diagnosis not present

## 2016-10-01 DIAGNOSIS — G8929 Other chronic pain: Secondary | ICD-10-CM | POA: Diagnosis not present

## 2016-10-01 DIAGNOSIS — M6281 Muscle weakness (generalized): Secondary | ICD-10-CM | POA: Diagnosis not present

## 2016-10-01 DIAGNOSIS — M545 Low back pain: Secondary | ICD-10-CM | POA: Diagnosis not present

## 2016-10-01 NOTE — Therapy (Signed)
[facility] [place], [place], [address] Phone: [phone]   Fax:  [phone]  Physical Therapy Treatment/ Discharge  Patient Details  Name: [name] MRN: [id number] Date of Birth: 1933/04/18 Referring Provider: Dr Carolann Littler  Encounter Date: 09/30/2016      PT End of Session - 09/30/16 1111    Visit Number 5   Number of Visits 16   Date for PT Re-Evaluation 10/14/16   Authorization Type [employer] Medicare complete   PT Start Time 1100   PT Stop Time 1138   PT Time Calculation (min) 38 min   Activity Tolerance Patient tolerated treatment well   Behavior During Therapy [facility] for tasks assessed/performed      Past Medical History:  Diagnosis Date  . Anemia, pernicious   . Arthritis    bursitis   . Atrophic gastritis   . B12 deficiency   . CAD (coronary artery disease)   . Carotid artery stenosis   . Diarrhea    on and off for months  . Family history of adverse reaction to anesthesia    extreme nausea brother and sister  . Fibromyalgia   . Gallbladder polyp   . GERD (gastroesophageal reflux disease)   . Heart murmur   . History of hiatal hernia   . History of kidney stones   . HLD (hyperlipidemia)   . HTN (hypertension)   . Hypothyroidism   . Irritable bowel syndrome (IBS)   . Median arcuate ligament syndrome ([place])   . PVD (peripheral vascular disease) ([place])   . Tubular adenoma of colon 08/2013   with focal high grade dysplasia    Past Surgical History:  Procedure Laterality Date  . ABDOMINAL HYSTERECTOMY    . APPENDECTOMY    . BIOPSY THYROID    . CHOLECYSTECTOMY N/A 10/24/2014   Procedure: MICROSCOPIC  CHOLECYSTECTOMY;  Surgeon: Ralene Ok, MD;  Location: [facility];  Service: General;  Laterality: N/A;  . CORONARY ARTERY BYPASS GRAFT  07  . ESOPHAGEAL MANOMETRY N/A 08/02/2012   Procedure: ESOPHAGEAL MANOMETRY (EM);  Surgeon: Sable Feil, MD;  Location: [facility];  Service:  Endoscopy;  Laterality: N/A;  . THYROID LOBECTOMY      There were no vitals filed for this visit.      Subjective Assessment - 09/30/16 1705    Subjective Patient still has moments when she reports her back is tender but she is using her stretches and strengthening exercises to keep her out of pain. She feels comfortable with discharge today.    Limitations Standing;Walking   How long can you sit comfortably? No limit    How long can you stand comfortably? < 10 minutes standing    How long can you walk comfortably? Limited distances before she starts to have pain    Diagnostic tests Nothing recently    Patient Stated Goals to have less pain   Currently in Pain? No/denies            [facility] PT Assessment - 10/01/16 0001      Observation/Other Assessments   Focus on Therapeutic Outcomes (FOTO)  35%     AROM   Lumbar Flexion 50% limitation    Lumbar Extension 50% limitation      PROM   Left Hip Flexion 115poiunds without pain      Strength   Overall Strength Comments hip strength 4+/5 gross      Flexibility   Hamstrings 20 degrees bilateral  Palpation   Spinal mobility Limited pA movement of the lumbar and thoracic spine                      [facility] Adult PT Treatment/Exercise - 10/01/16 0001      Lumbar Exercises: Stretches   Passive Hamstring Stretch Limitations seated 2x30sec holds bilateral with cuing    Lower Trunk Rotation Limitations x10 to improve ER/IR    Piriformis Stretch Limitations 2x20sec bilateral to rduce gluteal spasming      Lumbar Exercises: Standing   Other Standing Lumbar Exercises soulder extension red 2x10; scpa retraction red 2x10      Lumbar Exercises: Supine   Heel Slides Limitations x10 each with abdominal bracing    Bent Knee Raise Limitations x10 each with abdominal bracing                 PT Education - 10/01/16 0825    Education provided Yes   Education Details continue with stretching and exercises     Person(s) Educated Patient   Methods Explanation;Demonstration;Verbal cues   Comprehension Verbal cues required;Returned demonstration;Verbalized understanding          PT Short Term Goals - 10/01/16 0826      PT SHORT TERM GOAL #1   Title Patient will be independent with inital HEP    Baseline perfroming at home    Time 4   Period Weeks   Status Achieved     PT SHORT TERM GOAL #2   Title Patient will increase gorss right LE strength to 4+/5    Time 4   Period Weeks   Status Achieved     PT SHORT TERM GOAL #3   Title Patient will demsotrate WNL hip internal and external rotation    Baseline full internal and external without pain    Time 4   Period Weeks   Status Achieved           PT Long Term Goals - 10/01/16 4268      PT LONG TERM GOAL #1   Title Patient will return to the gym without a significant increase in pain    Baseline will likely return soon. reviewed bike and exercises to avod    Time 8   Period Weeks   Status Achieved     PT LONG TERM GOAL #2   Title Patient will bend down to the ground without pain in order to perfrom ADL's    Baseline picking up items much better without pain    Time 8   Period Weeks   Status Achieved     PT LONG TERM GOAL #3   Title Patient will sleep 4-6 hours straight without increased pain    Baseline per patient sleeping thorugh the night    Time 8   Period Weeks   Status Achieved     PT LONG TERM GOAL #4   Title Patient will demonstrate a 42% limitation on FOTO    Baseline 38% limitation   Time 8   Period Weeks   Status Achieved               Plan - 10/01/16 3419    Clinical Impression Statement Patient has reached goals for therapy. She continues to have intermittent pain with activity but she has a good plan to [facility] her pain. Therapy reivewed technique with all exercises.    Clinical Presentation Stable   Clinical Decision Making Low   Rehab Potential Good  PT Frequency 2x / week   PT Duration  8 weeks   PT Treatment/Interventions ADLs/Self Care Home Management;Cryotherapy;Electrical Stimulation;Iontophoresis 33m/ml Dexamethasone;Traction;Ultrasound;Gait training;Stair training;Functional mobility training;Patient/family education;Therapeutic exercise;Therapeutic activities;Neuromuscular re-education;Manual techniques;Passive range of motion;Taping   PT Next Visit Plan Patient also has some upper back pain review scapular sttrnegthening/ postural exercises extension; retraction; Reviewe clamshells; Consider low bridges if she can tolerate them; Review HEP; light gross lumbar mobilization. Keep in mid she does have osteoperosis.    PT Home Exercise Plan supine hip flexion; supine heel slide, lateral trunk rotation, piriformis stretch; hamstring stretch    Consulted and Agree with Plan of Care Patient      Patient will benefit from skilled therapeutic intervention in order to improve the following deficits and impairments:  Decreased range of motion, Decreased endurance, Decreased safety awareness, Decreased strength, Impaired sensation, Pain, Increased muscle spasms, Increased fascial restricitons, Abnormal gait, Postural dysfunction  Visit Diagnosis: Chronic bilateral low back pain without sciatica  Muscle spasm of back  Muscle weakness (generalized)       G-Codes - 0July 04, [id number]    Functional Assessment Tool Used (Outpatient Only) FOTO, clinical decision making    Functional Limitation Changing and maintaining body position   Changing and Maintaining Body Position Current Status (([id number] At least 40 percent but less than 60 percent impaired, limited or restricted   Changing and Maintaining Body Position Goal Status ((U0454 At least 20 percent but less than 40 percent impaired, limited or restricted   Changing and Maintaining Body Position Discharge Status ((U9811 At least 20 percent but less than 40 percent impaired, limited or restricted      Problem List Patient Active  Problem List   Diagnosis Date Noted  . Loss of weight 11/18/2013  . Nausea alone 11/18/2013  . Abdominal pain, epigastric 11/18/2013  . Anemia, iron deficiency 01/27/2013  . Temporomandibular joint-pain-dysfunction syndrome (TMJ) 06/28/2012  . Other abnormal glucose 08/25/2011  . Allergic rhinitis, cause unspecified 08/25/2011  . Right-sided chest wall pain 08/25/2011  . Murmur 09/20/2010  . Incisional hernia 09/20/2010  . HERNIATED DISC 01/02/2010  . CAROTID ARTERY DISEASE 08/27/2009  . OSTEOPOROSIS 06/07/2009  . BRADYCARDIA 02/05/2009  . BENIGN POSITIONAL VERTIGO 09/27/2008  . CELIAC ARTERY COMPRESSION SYNDROME 10/25/2007  . HYPOTHYROIDISM 01/08/2007  . Hyperlipidemia 01/08/2007  . Essential hypertension 01/08/2007  . Coronary atherosclerosis 01/08/2007  . PERIPHERAL VASCULAR DISEASE 01/08/2007  . ATROPHIC GASTRITIS 01/08/2007  . ANEMIA, PERNICIOUS, HX OF 01/08/2007  . RENAL CALCULUS, HX OF 01/08/2007  . HYSTERECTOMY, HX OF 01/08/2007  . APPENDECTOMY, HX OF 01/08/2007   PHYSICAL THERAPY DISCHARGE SUMMARY  Visits from Start of Care:5 Current functional level related to goals / functional outcomes: Improved sleep and ability to perform functional movements    Remaining deficits: Intermittent pain when she does moderate work around [employer]/ Equipment: HEP Plan: Patient agrees to discharge.  Patient goals were not met. Patient is being discharged due to meeting the stated rehab goals.  ?????      DCarney LivingPT DPT   6July 04, 2018 8:32 AM  [facility] [address]Phone: [phone]  Fax:  [phone] Name: JMIJA EFFERTZMRN: [id number] of Birth: [birth year]

## 2016-10-06 ENCOUNTER — Ambulatory Visit (INDEPENDENT_AMBULATORY_CARE_PROVIDER_SITE_OTHER): Payer: Medicare Other | Admitting: *Deleted

## 2016-10-06 DIAGNOSIS — E538 Deficiency of other specified B group vitamins: Secondary | ICD-10-CM | POA: Diagnosis not present

## 2016-10-06 MED ORDER — CYANOCOBALAMIN 1000 MCG/ML IJ SOLN
1000.0000 ug | Freq: Once | INTRAMUSCULAR | Status: AC
  Administered 2016-10-06: 1000 ug via INTRAMUSCULAR

## 2016-10-09 ENCOUNTER — Other Ambulatory Visit: Payer: Self-pay | Admitting: Family Medicine

## 2016-10-23 DIAGNOSIS — H353132 Nonexudative age-related macular degeneration, bilateral, intermediate dry stage: Secondary | ICD-10-CM | POA: Diagnosis not present

## 2016-10-23 DIAGNOSIS — H43813 Vitreous degeneration, bilateral: Secondary | ICD-10-CM | POA: Diagnosis not present

## 2016-11-05 ENCOUNTER — Telehealth: Payer: Self-pay | Admitting: Family Medicine

## 2016-11-05 ENCOUNTER — Ambulatory Visit (INDEPENDENT_AMBULATORY_CARE_PROVIDER_SITE_OTHER): Payer: Medicare Other

## 2016-11-05 DIAGNOSIS — E538 Deficiency of other specified B group vitamins: Secondary | ICD-10-CM

## 2016-11-05 MED ORDER — CYANOCOBALAMIN 1000 MCG/ML IJ SOLN
1000.0000 ug | Freq: Once | INTRAMUSCULAR | Status: AC
  Administered 2016-11-05: 1000 ug via INTRAMUSCULAR

## 2016-11-05 NOTE — Telephone Encounter (Signed)
Noted. I will call patient.

## 2016-11-05 NOTE — Telephone Encounter (Signed)
The patient wanted to know if she can have [facility] do her B12 injection when she comes in with her husband for coumadin to [facility]. She states that it would be a lot easier on her and not as far as a drive for her injection. Please advise patient

## 2016-11-07 ENCOUNTER — Ambulatory Visit: Payer: Medicare Other

## 2016-11-17 ENCOUNTER — Telehealth: Payer: Self-pay | Admitting: Gastroenterology

## 2016-11-17 DIAGNOSIS — D510 Vitamin B12 deficiency anemia due to intrinsic factor deficiency: Secondary | ICD-10-CM | POA: Diagnosis not present

## 2016-11-17 DIAGNOSIS — R7309 Other abnormal glucose: Secondary | ICD-10-CM | POA: Diagnosis not present

## 2016-11-17 DIAGNOSIS — Z8639 Personal history of other endocrine, nutritional and metabolic disease: Secondary | ICD-10-CM | POA: Diagnosis not present

## 2016-11-17 DIAGNOSIS — M810 Age-related osteoporosis without current pathological fracture: Secondary | ICD-10-CM | POA: Diagnosis not present

## 2016-11-17 NOTE — Telephone Encounter (Signed)
Pt has been on cholestyramine and states she has started to have diarrhea episodes again.  1-2 episodes daily and occasionally 4-5 times.  Some days she has no problems.  She states fruit makes it worse, dark greens make it worse.  Has lost down to 97.6 from 102 lbs.  Stopped budesonide at last office visit 07/01/16.  She will keep a food diary and see what makes the diarrhea worse.  She will continue her meds as directed and call back in Monday.  She was given an appt to follow up with Dr Ardis Hughs and advised to increase drinking boost to twice daily instead of once daily.

## 2016-11-18 LAB — BASIC METABOLIC PANEL: BUN: 24 — AB (ref 4–21)

## 2016-11-18 LAB — LIPID PANEL
Cholesterol: 135 (ref 0–200)
Triglycerides: 54 (ref 40–160)

## 2016-11-18 LAB — HEMOGLOBIN A1C: Hemoglobin A1C: 5.8

## 2016-11-18 LAB — TSH: TSH: 3.8 (ref <=5.90)

## 2016-11-20 DIAGNOSIS — Z8639 Personal history of other endocrine, nutritional and metabolic disease: Secondary | ICD-10-CM | POA: Diagnosis not present

## 2016-11-20 DIAGNOSIS — E784 Other hyperlipidemia: Secondary | ICD-10-CM | POA: Diagnosis not present

## 2016-11-20 DIAGNOSIS — R7303 Prediabetes: Secondary | ICD-10-CM | POA: Diagnosis not present

## 2016-11-20 DIAGNOSIS — D510 Vitamin B12 deficiency anemia due to intrinsic factor deficiency: Secondary | ICD-10-CM | POA: Diagnosis not present

## 2016-11-20 DIAGNOSIS — M810 Age-related osteoporosis without current pathological fracture: Secondary | ICD-10-CM | POA: Diagnosis not present

## 2016-11-20 LAB — BASIC METABOLIC PANEL: Glucose: 78

## 2016-11-20 LAB — VITAMIN B12: Vitamin B-12: 583

## 2016-11-24 ENCOUNTER — Encounter: Payer: Self-pay | Admitting: Family Medicine

## 2016-11-24 ENCOUNTER — Telehealth: Payer: Self-pay | Admitting: Gastroenterology

## 2016-11-24 NOTE — Telephone Encounter (Signed)
Patient reports that she is feeling better using the suggestions that Patty gave her on 11/17/16.  She has some occasional diarrhea. She has gained about 2 lbs back.  She has decreased the amount of fruit she is eating.  See phone note from 11/17/16. She is only having one - two stools a day not always diarrhea.  She wanted you to know she is "doing well now".

## 2016-12-05 ENCOUNTER — Ambulatory Visit: Payer: Medicare Other

## 2016-12-08 ENCOUNTER — Ambulatory Visit: Payer: Medicare Other | Admitting: Family Medicine

## 2016-12-09 ENCOUNTER — Ambulatory Visit: Payer: Medicare Other

## 2016-12-09 ENCOUNTER — Ambulatory Visit (INDEPENDENT_AMBULATORY_CARE_PROVIDER_SITE_OTHER): Payer: Medicare Other

## 2016-12-09 ENCOUNTER — Ambulatory Visit (INDEPENDENT_AMBULATORY_CARE_PROVIDER_SITE_OTHER): Payer: Medicare Other | Admitting: *Deleted

## 2016-12-09 VITALS — BP 144/70 | HR 70 | Ht 63.0 in | Wt 99.5 lb

## 2016-12-09 DIAGNOSIS — D509 Iron deficiency anemia, unspecified: Secondary | ICD-10-CM | POA: Diagnosis not present

## 2016-12-09 DIAGNOSIS — E538 Deficiency of other specified B group vitamins: Secondary | ICD-10-CM

## 2016-12-09 DIAGNOSIS — Z862 Personal history of diseases of the blood and blood-forming organs and certain disorders involving the immune mechanism: Secondary | ICD-10-CM

## 2016-12-09 DIAGNOSIS — Z0000 Encounter for general adult medical examination without abnormal findings: Secondary | ICD-10-CM | POA: Diagnosis not present

## 2016-12-09 MED ORDER — CYANOCOBALAMIN 1000 MCG/ML IJ SOLN
1000.0000 ug | Freq: Once | INTRAMUSCULAR | Status: AC
  Administered 2016-12-09: 1000 ug via INTRAMUSCULAR

## 2016-12-09 NOTE — Patient Instructions (Addendum)
[name] , Thank you for taking time to come for your Medicare Wellness Visit. I appreciate your ongoing commitment to your health goals. Please review the following plan we discussed and let me know if I can assist you in the future.   Good Luck to you!  A Tetanus is recommended every 10 years. Medicare covers a tetanus if you have a cut or wound; otherwise, there may be a charge. If you had not had a tetanus with pertusses, known as the Tdap, you can take this anytime.   Shingrix is a vaccine for the prevention of Shingles in Adults 50 and older.  If you are on Medicare, you can request a prescription from your doctor to be filled at a pharmacy.  Please check with your benefits regarding applicable copays or out of pocket expenses.  The Shingrix is given in 2 vaccines approx 8 weeks apart. You must receive the 2nd dose prior to 6 months from receipt of the first.   Will try to complete AD; Given copy  Referred to [facility] for questions [facility] offers free advance directive forms, as well as assistance in completing the forms themselves. For assistance, contact the Spiritual Care Department at [phone], or the Clinical Social Work Department at [phone].      These are the goals we discussed: Goals    . Attending meditation / other stress management classes          Would do "relaxation"  Would love to do tai chi again  "A Matter of Balance" Class  Wednesdays - April 2 to May 21  Date: 08/11/2012 3:00 PM - 4:30 PM  Cost: Free  Location: [facility] [address] [place], [place] [address]  Add to my Calendar   This is an evidence-based program designed to reduce the fear of falling and improve activity levels among older adults. Classes include group discussions, mutual problem solving, and exercises to improve strength, coordination and balance. Registration is required, as space is limited. Attendance at all sessions is necessary for successful  completion of the class. Please call [phone] to register. Funding for the class is made possible by a grant from the [employer] [facility] [employer]. Contributions are accepted. The last day to join the class is Wednesday, April 16.     Parks and recreation; Tai Chi  Can go to [place] if needed   ;    . Exercise 150 minutes per week (moderate activity)          Is considering aerobics        This is a list of the screening recommended for you and due dates:  Health Maintenance  Topic Date Due  . Tetanus Vaccine  10/29/1951  . Flu Shot  11/26/2016  . Mammogram  01/13/2017  . DEXA scan (bone density measurement)  Completed  . Pneumonia vaccines  Completed   [id number]   Health Maintenance for Postmenopausal Women Menopause is a normal process in which your reproductive ability comes to an end. This process happens gradually over a span of months to years, usually between the ages of 48 and 55. Menopause is complete when you have missed 12 consecutive menstrual periods. It is important to talk with your health care provider about some of the most common conditions that affect postmenopausal women, such as heart disease, cancer, and bone loss (osteoporosis). Adopting a healthy lifestyle and getting preventive care can help to promote your health and wellness. Those actions   can also lower your chances of developing some of these common conditions. What should I know about menopause? During menopause, you may experience a number of symptoms, such as:  Moderate-to-severe hot flashes.  Night sweats.  Decrease in sex drive.  Mood swings.  Headaches.  Tiredness.  Irritability.  Memory problems.  Insomnia.  Choosing to treat or not to treat menopausal changes is an individual decision that you make with your health care provider. What should I know about hormone replacement therapy and supplements? Hormone therapy products are effective for  treating symptoms that are associated with menopause, such as hot flashes and night sweats. Hormone replacement carries certain risks, especially as you become older. If you are thinking about using estrogen or estrogen with progestin treatments, discuss the benefits and risks with your health care provider. What should I know about heart disease and stroke? Heart disease, heart attack, and stroke become more likely as you age. This may be due, in part, to the hormonal changes that your body experiences during menopause. These can affect how your body processes dietary fats, triglycerides, and cholesterol. Heart attack and stroke are both medical emergencies. There are many things that you can do to help prevent heart disease and stroke:  Have your blood pressure checked at least every 1-2 years. High blood pressure causes heart disease and increases the risk of stroke.  If you are 68-72 years old, ask your health care provider if you should take aspirin to prevent a heart attack or a stroke.  Do not use any tobacco products, including cigarettes, chewing tobacco, or electronic cigarettes. If you need help quitting, ask your health care provider.  It is important to eat a healthy diet and maintain a healthy weight. ? Be sure to include plenty of vegetables, fruits, low-fat dairy products, and lean protein. ? Avoid eating foods that are high in solid fats, added sugars, or salt (sodium).  Get regular exercise. This is one of the most important things that you can do for your health. ? Try to exercise for at least 150 minutes each week. The type of exercise that you do should increase your heart rate and make you sweat. This is known as moderate-intensity exercise. ? Try to do strengthening exercises at least twice each week. Do these in addition to the moderate-intensity exercise.  Know your numbers.Ask your health care provider to check your cholesterol and your blood glucose. Continue to have  your blood tested as directed by your health care provider.  What should I know about cancer screening? There are several types of cancer. Take the following steps to reduce your risk and to catch any cancer development as early as possible. Breast Cancer  Practice breast self-awareness. ? This means understanding how your breasts normally appear and feel. ? It also means doing regular breast self-exams. Let your health care provider know about any changes, no matter how small.  If you are 67 or older, have a clinician do a breast exam (clinical breast exam or CBE) every year. Depending on your age, family history, and medical history, it may be recommended that you also have a yearly breast X-ray (mammogram).  If you have a family history of breast cancer, talk with your health care provider about genetic screening.  If you are at high risk for breast cancer, talk with your health care provider about having an MRI and a mammogram every year.  Breast cancer (BRCA) gene test is recommended for women who have family members  with BRCA-related cancers. Results of the assessment will determine the need for genetic counseling and BRCA1 and for BRCA2 testing. BRCA-related cancers include these types: ? Breast. This occurs in males or females. ? Ovarian. ? Tubal. This may also be called fallopian tube cancer. ? Cancer of the abdominal or pelvic lining (peritoneal cancer). ? Prostate. ? Pancreatic.  Cervical, Uterine, and Ovarian Cancer Your health care provider may recommend that you be screened regularly for cancer of the pelvic organs. These include your ovaries, uterus, and vagina. This screening involves a pelvic exam, which includes checking for microscopic changes to the surface of your cervix (Pap test).  For women ages 21-65, health care providers may recommend a pelvic exam and a Pap test every three years. For women ages 30-65, they may recommend the Pap test and pelvic exam, combined  with testing for human papilloma virus (HPV), every five years. Some types of HPV increase your risk of cervical cancer. Testing for HPV may also be done on women of any age who have unclear Pap test results.  Other health care providers may not recommend any screening for nonpregnant women who are considered low risk for pelvic cancer and have no symptoms. Ask your health care provider if a screening pelvic exam is right for you.  If you have had past treatment for cervical cancer or a condition that could lead to cancer, you need Pap tests and screening for cancer for at least 20 years after your treatment. If Pap tests have been discontinued for you, your risk factors (such as having a new sexual partner) need to be reassessed to determine if you should start having screenings again. Some women have medical problems that increase the chance of getting cervical cancer. In these cases, your health care provider may recommend that you have screening and Pap tests more often.  If you have a family history of uterine cancer or ovarian cancer, talk with your health care provider about genetic screening.  If you have vaginal bleeding after reaching menopause, tell your health care provider.  There are currently no reliable tests available to screen for ovarian cancer.  Lung Cancer Lung cancer screening is recommended for adults 55-80 years old who are at high risk for lung cancer because of a history of smoking. A yearly low-dose CT scan of the lungs is recommended if you:  Currently smoke.  Have a history of at least 30 pack-years of smoking and you currently smoke or have quit within the past 15 years. A pack-year is smoking an average of one pack of cigarettes per day for one year.  Yearly screening should:  Continue until it has been 15 years since you quit.  Stop if you develop a health problem that would prevent you from having lung cancer treatment.  Colorectal Cancer  This type of  cancer can be detected and can often be prevented.  Routine colorectal cancer screening usually begins at age 50 and continues through age 75.  If you have risk factors for colon cancer, your health care provider may recommend that you be screened at an earlier age.  If you have a family history of colorectal cancer, talk with your health care provider about genetic screening.  Your health care provider may also recommend using home test kits to check for hidden blood in your stool.  A small camera at the end of a tube can be used to examine your colon directly (sigmoidoscopy or colonoscopy). This is done to check for   the earliest forms of colorectal cancer.  Direct examination of the colon should be repeated every 5-10 years until age 75. However, if early forms of precancerous polyps or small growths are found or if you have a family history or genetic risk for colorectal cancer, you may need to be screened more often.  Skin Cancer  Check your skin from head to toe regularly.  Monitor any moles. Be sure to tell your health care provider: ? About any new moles or changes in moles, especially if there is a change in a mole's shape or color. ? If you have a mole that is larger than the size of a pencil eraser.  If any of your family members has a history of skin cancer, especially at a young age, talk with your health care provider about genetic screening.  Always use sunscreen. Apply sunscreen liberally and repeatedly throughout the day.  Whenever you are outside, protect yourself by wearing long sleeves, pants, a wide-brimmed hat, and sunglasses.  What should I know about osteoporosis? Osteoporosis is a condition in which bone destruction happens more quickly than new bone creation. After menopause, you may be at an increased risk for osteoporosis. To help prevent osteoporosis or the bone fractures that can happen because of osteoporosis, the following is recommended:  If you are  19-50 years old, get at least 1,000 mg of calcium and at least 600 mg of vitamin D per day.  If you are older than age 50 but younger than age 70, get at least 1,200 mg of calcium and at least 600 mg of vitamin D per day.  If you are older than age 70, get at least 1,200 mg of calcium and at least 800 mg of vitamin D per day.  Smoking and excessive alcohol intake increase the risk of osteoporosis. Eat foods that are rich in calcium and vitamin D, and do weight-bearing exercises several times each week as directed by your health care provider. What should I know about how menopause affects my mental health? Depression may occur at any age, but it is more common as you become older. Common symptoms of depression include:  Low or sad mood.  Changes in sleep patterns.  Changes in appetite or eating patterns.  Feeling an overall lack of motivation or enjoyment of activities that you previously enjoyed.  Frequent crying spells.  Talk with your health care provider if you think that you are experiencing depression. What should I know about immunizations? It is important that you get and maintain your immunizations. These include:  Tetanus, diphtheria, and pertussis (Tdap) booster vaccine.  Influenza every year before the flu season begins.  Pneumonia vaccine.  Shingles vaccine.  Your health care provider may also recommend other immunizations. This information is not intended to replace advice given to you by your health care provider. Make sure you discuss any questions you have with your health care provider. Document Released: 06/06/2005 Document Revised: 11/02/2015 Document Reviewed: 01/16/2015 Elsevier Interactive Patient Education  2018 [employer]    Fall Prevention in the Home Falls can cause injuries and can affect people from all age groups. There are many simple things that you can do to make your home safe and to help prevent falls. What can I do on the outside of my  home?  Regularly repair the edges of walkways and driveways and fix any cracks.  Remove high doorway thresholds.  Trim any shrubbery on the main path into your home.  Use bright outdoor   lighting.  Clear walkways of debris and clutter, including tools and rocks.  Regularly check that handrails are securely fastened and in good repair. Both sides of any steps should have handrails.  Install guardrails along the edges of any raised decks or porches.  Have leaves, snow, and ice cleared regularly.  Use sand or salt on walkways during winter months.  In the garage, clean up any spills right away, including grease or oil spills. What can I do in the bathroom?  Use night lights.  Install grab bars by the toilet and in the tub and shower. Do not use towel bars as grab bars.  Use non-skid mats or decals on the floor of the tub or shower.  If you need to sit down while you are in the shower, use a plastic, non-slip stool.  Keep the floor dry. Immediately clean up any water that spills on the floor.  Remove soap buildup in the tub or shower on a regular basis.  Attach bath mats securely with double-sided non-slip rug tape.  Remove throw rugs and other tripping hazards from the floor. What can I do in the bedroom?  Use night lights.  Make sure that a bedside light is easy to reach.  Do not use oversized bedding that drapes onto the floor.  Have a firm chair that has side arms to use for getting dressed.  Remove throw rugs and other tripping hazards from the floor. What can I do in the kitchen?  Clean up any spills right away.  Avoid walking on wet floors.  Place frequently used items in easy-to-reach places.  If you need to reach for something above you, use a sturdy step stool that has a grab bar.  Keep electrical cables out of the way.  Do not use floor polish or wax that makes floors slippery. If you have to use wax, make sure that it is non-skid floor  wax.  Remove throw rugs and other tripping hazards from the floor. What can I do in the stairways?  Do not leave any items on the stairs.  Make sure that there are handrails on both sides of the stairs. Fix handrails that are broken or loose. Make sure that handrails are as long as the stairways.  Check any carpeting to make sure that it is firmly attached to the stairs. Fix any carpet that is loose or worn.  Avoid having throw rugs at the top or bottom of stairways, or secure the rugs with carpet tape to prevent them from moving.  Make sure that you have a light switch at the top of the stairs and the bottom of the stairs. If you do not have them, have them installed. What are some other fall prevention tips?  Wear closed-toe shoes that fit well and support your feet. Wear shoes that have rubber soles or low heels.  When you use a stepladder, make sure that it is completely opened and that the sides are firmly locked. Have someone hold the ladder while you are using it. Do not climb a closed stepladder.  Add color or contrast paint or tape to grab bars and handrails in your home. Place contrasting color strips on the first and last steps.  Use mobility aids as needed, such as canes, walkers, scooters, and crutches.  Turn on lights if it is dark. Replace any light bulbs that burn out.  Set up furniture so that there are clear paths. Keep the furniture in the same spot.    Fix any uneven floor surfaces.  Choose a carpet design that does not hide the edge of steps of a stairway.  Be aware of any and all pets.  Review your medicines with your healthcare provider. Some medicines can cause dizziness or changes in blood pressure, which increase your risk of falling. Talk with your health care provider about other ways that you can decrease your risk of falls. This may include working with a physical therapist or trainer to improve your strength, balance, and endurance. This information is  not intended to replace advice given to you by your health care provider. Make sure you discuss any questions you have with your health care provider. Document Released: 04/04/2002 Document Revised: 09/11/2015 Document Reviewed: 05/19/2014 Elsevier Interactive Patient Education  2017 [employer]   

## 2016-12-09 NOTE — Progress Notes (Addendum)
Subjective:   [name] is a 83 y.o. female who presents for Medicare Annual (Subsequent) preventive examination.  The Patient was informed that the wellness visit is to identify future health risk and educate and initiate measures that can reduce risk for increased disease through the lifespan.    Annual Wellness Assessment  Reports health as  Had hx CABG  Mammogram 12/2015 due 12/2016 - will repeat this year dexa scan 04/2016 ( taking reclast q year) still take reclast The patient reported it was better   Dr Edison Nasuti following colitis which she is just recovering from and weight is down currently    Preventive Screening -Counseling & Management  Medicare Annual Preventive Care Visit - Subsequent Last OV/ 07/2016  Colonoscopy 12/2015  Home is one level that they live in Has 2 walk in showers No children   VS reviewed;  States BP runs up around 977 systolic at times  Diet  Spoke with Dr. Ardis Hughs nurse Is doing a food diary -did really well Eats lots of vegetables and fruits; (Gallbladder out)  Can't eat dark greens Cut back on fruit Eat more bland foods; Eats eggs; eat cheese, yogurt, peanut butter Use boost ( now 2 per day)    BMI 17   Exercise She does housework Shop  [employer]  Dr. Elease Hashimoto ordered therapy in April and it did help her  Has a treadmill and bike at home Has not felt like doing this lately  Hearing Screening Comments: Hearing vision Is good  Vision Screening Comments: Does have MD Sees the retinal specialist q 6 months Dr. Juanito Doom Dr [place] is her specialist    Fall x 1 in shower drying off after she completed her therapy [place] on right buttocks; states it was tender but she was not hurt   Dental just saw her dentist  Stressors: no issues x health Did update her home and other things that created a few financial issues but ok now  Sleep patterns: normally yes  Pain? Still has some back pain but it is  manageable    Cardiac Risk Factors Addressed Hyperlipidemia - good / chol/hdl ratio is 2  A1c 5.8  Advanced Directives no Will try to complete AD; Given copy  Referred to [facility] for questions [place] offers free advance directive forms, as well as assistance in completing the forms themselves. For assistance, contact the Spiritual Care Department at [phone], or the Clinical Social Work Department at [phone].     Patient Care Team: [name], MD as PCP - General (Family Medicine) Sherren Mocha, MD (Cardiology) Unice Bailey, MD (Internal Medicine) Delrae Rend, MD (Endocrinology) Shon Hough, MD (Ophthalmology) Barbaraann Rondo, MD (Obstetrics and Gynecology) Rolm Bookbinder, MD (Dermatology) Assessed for additional providers  Immunization History  Administered Date(s) Administered  . Influenza Whole 02/18/2008, 02/26/2010  . Influenza,inj,Quad PF,36+ Mos 02/01/2014  . Influenza-Unspecified 01/27/2012, 01/27/2015, 02/22/2016  . Pneumococcal Conjugate-13 08/25/2013  . Pneumococcal Polysaccharide-23 06/27/2004  . Zoster 01/27/2015   Required Immunizations needed today  Screening test up to date or reviewed for plan of completion Health Maintenance Due  Topic Date Due  . TETANUS/TDAP  10/29/1951  . INFLUENZA VACCINE  11/26/2016     Keep in mind the flu shot is an inactivated vaccine and takes at least 2 weeks to build immunity. The flu virus can be dormant for 4 days prior to symptoms Taking the flu shot at the beginning of the season can reduce the risk for  the entire community.    A Tetanus is recommended every 10 years. Medicare covers a tetanus if you have a cut or wound; otherwise, there may be a charge. If you had not had a tetanus with pertusses, known as the Tdap, you can take this anytime.   Shingrix is a vaccine for the prevention of Shingles in Adults 50 and older.  If you are on Medicare, you can request a prescription from  your doctor to be filled at a pharmacy.  Please check with your benefits regarding applicable copays or out of pocket expenses.  The Shingrix is given in 2 vaccines approx 8 weeks apart. You must receive the 2nd dose prior to 6 months from receipt of the first.       Cardiac Risk Factors include: advanced age (>72mn, >>29women);dyslipidemia;hypertension     Objective:     Vitals: BP (!) 144/70   Pulse 70   Ht _0  (1.6 m)   Wt 99 lb 8 oz (45.1 kg)   SpO2 92%   BMI 17.63 kg/m   Body mass index is 17.63 kg/m.   Tobacco History  Smoking Status  . Never Smoker  Smokeless Tobacco  . Never Used     Counseling given: Yes   Past Medical History:  Diagnosis Date  . Anemia, pernicious   . Arthritis    bursitis   . Atrophic gastritis   . B12 deficiency   . CAD (coronary artery disease)   . Carotid artery stenosis   . Diarrhea    on and off for months  . Family history of adverse reaction to anesthesia    extreme nausea brother and sister  . Fibromyalgia   . Gallbladder polyp   . GERD (gastroesophageal reflux disease)   . Heart murmur   . History of hiatal hernia   . History of kidney stones   . HLD (hyperlipidemia)   . HTN (hypertension)   . Hypothyroidism   . Irritable bowel syndrome (IBS)   . Median arcuate ligament syndrome ([place]   . PVD (peripheral vascular disease) ([place]   . Tubular adenoma of colon 08/2013   with focal high grade dysplasia   Past Surgical History:  Procedure Laterality Date  . ABDOMINAL HYSTERECTOMY    . APPENDECTOMY    . BIOPSY THYROID    . CHOLECYSTECTOMY N/A 10/24/2014   Procedure: MICROSCOPIC  CHOLECYSTECTOMY;  Surgeon: ARalene Ok MD;  [place]  Service: General;  Laterality: N/A;  . CORONARY ARTERY BYPASS GRAFT  07  . ESOPHAGEAL MANOMETRY N/A 08/02/2012   Procedure: ESOPHAGEAL MANOMETRY (EM);  Surgeon: DSable Feil MD;  Location: [facility];  Service: Endoscopy;  Laterality: N/A;  . THYROID LOBECTOMY      Family History  Problem Relation Age of Onset  . Heart disease Mother   . Cholelithiasis Mother   . Prostate cancer Brother   . Colon cancer Brother   . Breast cancer Other        niece - breast cancer  . Diabetes Sister   . Lung cancer Father   . Lung cancer Sister   . Stomach cancer Cousin   . Esophageal cancer Neg Hx    History  Sexual Activity  . Sexual activity: Not on file    Outpatient Encounter Prescriptions as of 12/09/2016  Medication Sig  . acetaminophen (TYLENOL) 500 MG tablet Take 500 mg by mouth every 6 (six) hours as needed (pain).  .Marland KitchenamLODipine (NORVASC) 10 MG tablet TAKE  1 TABLET BY MOUTH DAILY  . aspirin EC 81 MG tablet Take 81 mg by mouth at bedtime.  Marland Kitchen atorvastatin (LIPITOR) 80 MG tablet TAKE 1 TABLET BY MOUTH AT BEDTIME.  Marland Kitchen azelastine (ASTELIN) 0.1 % nasal spray Place 1 spray into both nostrils 2 (two) times daily as needed for rhinitis or allergies. Use in each nostril as directed  . Calcium Carbonate-Vitamin D (CALTRATE 600+D PO) Take 600 mg by mouth daily with lunch.  Marland Kitchen CARAFATE 1 GM/10ML suspension TAKE 10 MLS (1 G TOTAL) BY MOUTH 2 (TWO) TIMES DAILY.  . carvedilol (COREG) 6.25 MG tablet TAKE 1/2 TABLET BY MOUTH 2 TIMES DAILY.  Marland Kitchen Cholecalciferol (VITAMIN D) 2000 UNITS tablet Take 2,000 Units by mouth daily with lunch.  . cholestyramine (QUESTRAN) 4 GM/DOSE powder Take 1 packet (4 g total) by mouth daily.  . cyanocobalamin (,VITAMIN B-12,) 1000 MCG/ML injection Inject 1,000 mcg into the muscle every 30 (thirty) days. On or about the end of each month  . fluticasone (FLONASE) 50 MCG/ACT nasal spray Place 2 sprays into the nose daily as needed. (Patient taking differently: Place 1 spray into both nostrils daily as needed for allergies. )  . levothyroxine (SYNTHROID, LEVOTHROID) 50 MCG tablet Take 50 mcg by mouth daily before breakfast.  . losartan (COZAAR) 100 MG tablet TAKE ONE TABLET BY MOUTH DAILY  . Multiple Vitamins-Minerals (PRESERVISION AREDS 2) CAPS  Take 1 tablet by mouth daily with lunch.  Marland Kitchen OVER THE COUNTER MEDICATION Place 1 drop into both eyes 2 (two) times daily as needed (dry eyes). Preservative free over the counter eye drop for dry eyes  . saccharomyces boulardii (FLORASTOR) 250 MG capsule Take 250 mg by mouth daily with lunch.  . sodium chloride (OCEAN) 0.65 % nasal spray Place 1 spray into both nostrils 3 (three) times daily as needed for congestion.   Marland Kitchen spironolactone (ALDACTONE) 25 MG tablet TAKE 1 TABLET BY MOUTH ONCE DAILY.  . traMADol (ULTRAM) 50 MG tablet Take 1 tablet (50 mg total) by mouth every 6 (six) hours as needed (pain).  . AFLURIA QUADRIVALENT injection Inject as directed once.  . [DISCONTINUED] cephALEXin (KEFLEX) 500 MG capsule Take 1 capsule (500 mg total) by mouth 4 (four) times daily.   No facility-administered encounter medications on file as of 12/09/2016.     Activities of Daily Living In your present state of health, do you have any difficulty performing the following activities: 12/09/2016 04/16/2016  Hearing? N N  Vision? N N  Difficulty concentrating or making decisions? N N  Walking or climbing stairs? N N  Dressing or bathing? N N  Doing errands, shopping? N -  Preparing Food and eating ? N -  Using the Toilet? N -  Do you have problems with loss of bowel control? Y -  Comment having some colitis symptoms but resolving  -  Managing your Medications? N -  Managing your Finances? N -  Housekeeping or managing your Housekeeping? N -  Some recent data might be hidden    Patient Care Team: [name], MD as PCP - General (Family Medicine) Sherren Mocha, MD (Cardiology) Unice Bailey, MD (Internal Medicine) Delrae Rend, MD (Endocrinology) Shon Hough, MD (Ophthalmology) Barbaraann Rondo, MD (Obstetrics and Gynecology) Rolm Bookbinder, MD (Dermatology)    Assessment:     Exercise Activities and Dietary recommendations Current Exercise Habits: Home exercise routine,  Intensity: Mild  Goals    . Attending meditation / other stress management classes  Would do "relaxation"  Would love to do tai chi again  "A Matter of Balance" Class  Wednesdays - April 2 to May 21  Date: 08/11/2012 3:00 PM - 4:30 PM  Cost: Free  Location: [facility] [place], [place]  Add to my Calendar   This is an evidence-based program designed to reduce the fear of falling and improve activity levels among older adults. Classes include group discussions, mutual problem solving, and exercises to improve strength, coordination and balance. Registration is required, as space is limited. Attendance at all sessions is necessary for successful completion of the class. Please call [phone] to register. Funding for the class is made possible by a grant from the [employer]. Contributions are accepted. The last day to join the class is Wednesday, April 16.    [place] and recreation; Tai Chi  Can go to [facility] if needed   ;    . Exercise 150 minutes per week (moderate activity)          Is considering aerobics       Fall Risk Fall Risk  12/09/2016 05/21/2015 11/16/2014 08/25/2013  Falls in the past year? No Yes No No  Number falls in past yr: - 1 - -  Risk for fall due to : - Other (Comment) - -   Depression Screen PHQ 2/9 Scores 12/09/2016 05/21/2015 11/16/2014 08/25/2013  PHQ - 2 Score 0 0 0 0     Cognitive Function Ad8 score reviewed for issues:  Issues making decisions:  Less interest in hobbies / activities:  Repeats questions, stories (family complaining):  Trouble using ordinary gadgets (microwave, computer, phone):  Forgets the month or year:   Mismanaging finances:   Remembering appts:  Daily problems with thinking and/or memory: Ad8 score is=0          Immunization History  Administered Date(s) Administered  . Influenza Whole 02/18/2008,  02/26/2010  . Influenza,inj,Quad PF,36+ Mos 02/01/2014  . Influenza-Unspecified 01/27/2012, 01/27/2015, 02/22/2016  . Pneumococcal Conjugate-13 08/25/2013  . Pneumococcal Polysaccharide-23 06/27/2004  . Zoster 01/27/2015   Screening Tests Health Maintenance  Topic Date Due  . TETANUS/TDAP  10/29/1953  . INFLUENZA VACCINE  11/26/2016  . MAMMOGRAM  01/13/2017  . DEXA SCAN  Completed  . PNA vac Low Risk Adult  Completed      Plan:     PCP Notes   Health Maintenance Labs brought from Dr. Buddy Duty and wnl  Ms. Dierolf brought labs from Dr. Cindra Eves office. All within normal limits Dr. Jarold Song reviewed last B12 level and will go ahead and repeat B12. CMA will give B12 shot today.  Continue is already classed for her osteoporosis. Sustained 1 fall recently coming out of shower. No injury noted.  Will consider tetanus   Will take her flu vaccine when available  Abnormal Screens all labs were normal States she has macular degeneration that her vision is good at present.   Referrals  No referrals  Patient concerns; None voiced  Nurse Concerns; Biggest issue is her resolving colitis. States it is getting better. Dr. Ardis Hughs following. Is taking boost one time a day and trying to drink too if she is able. Fall prevention information given. Patient has 1 level home but does have a basement. Managing with her spells in home at the present time without issues.   Next PCP apt As needed     I have personally reviewed and noted the following  in the patient's chart:   . Medical and social history . Use of alcohol, tobacco or illicit drugs  . Current medications and supplements . Functional ability and status . Nutritional status . Physical activity . Advanced directives . List of other physicians . Hospitalizations, surgeries, and ER visits in previous 12 months . Vitals . Screenings to include cognitive, depression, and falls . Referrals and appointments  In addition, I  have reviewed and discussed with patient certain preventive protocols, quality metrics, and best practice recommendations. A written personalized care plan for preventive services as well as general preventive health recommendations were provided to patient.     QBHAL,PFXTK, RN  12/09/2016  Agree with assessment as above.  [name] MD [facility]

## 2016-12-15 ENCOUNTER — Other Ambulatory Visit: Payer: Self-pay | Admitting: Cardiovascular Disease

## 2016-12-15 DIAGNOSIS — I6523 Occlusion and stenosis of bilateral carotid arteries: Secondary | ICD-10-CM

## 2016-12-24 ENCOUNTER — Encounter: Payer: Self-pay | Admitting: Podiatry

## 2016-12-24 ENCOUNTER — Ambulatory Visit (INDEPENDENT_AMBULATORY_CARE_PROVIDER_SITE_OTHER): Payer: Medicare Other | Admitting: Podiatry

## 2016-12-24 DIAGNOSIS — B351 Tinea unguium: Secondary | ICD-10-CM

## 2016-12-24 DIAGNOSIS — M79672 Pain in left foot: Secondary | ICD-10-CM | POA: Diagnosis not present

## 2016-12-24 DIAGNOSIS — L600 Ingrowing nail: Secondary | ICD-10-CM

## 2016-12-24 DIAGNOSIS — M79671 Pain in right foot: Secondary | ICD-10-CM | POA: Diagnosis not present

## 2016-12-24 NOTE — Patient Instructions (Signed)
Seen for hypertrophic nails. All nails debrided. Bleeding right great toe nail cleansed with Iodine and dressing applied.  Remove bandage when taking shower. Return in 3 months or as needed.

## 2016-12-24 NOTE — Progress Notes (Signed)
SUBJECTIVE: 83 y.o.year old femalepresents with painful thick toe nails.  Left big toe nails has been hurting on the corners. Has a poor eye sight and having difficulty seen her nails.  OBJECTIVE: DERMATOLOGIC EXAMINATION: Hypertrophic nails x 10. Thick dystrophic nail devitalized right great toe at nail bed.  Ingrown nail with pain on left great toe.  No open skin lesions.   VASCULAR EXAMINATION OF LOWER LIMBS: All pedal pulses are palpable with normal pulsation.  Capillary Filling times within 3 seconds in all digits.  Both feet are cold to touch.   NEUROLOGIC EXAMINATION OF THE LOWER LIMBS: All epicritic and tactile sensations grossly intactl.   MUSCULOSKELETAL EXAMINATION: No gross deformities noted.   ASSESSMENT: Mycotic nails x 10. Ingrown nail symptomatic left great toe and deformed right great toe nail.  Painful feet.  PLAN: Reviewed findings and available treatment options. All nails debrided.  Bleeding right great toe nail bed cleansed and dressing applied with Iodine. Return in 3 month for routine foot care.

## 2016-12-25 ENCOUNTER — Ambulatory Visit (HOSPITAL_COMMUNITY)
Admission: RE | Admit: 2016-12-25 | Discharge: 2016-12-25 | Disposition: A | Discharge status: Home / Self Care | Payer: Medicare Other | Source: Ambulatory Visit | Attending: Cardiology | Admitting: Cardiology

## 2016-12-25 DIAGNOSIS — I10 Essential (primary) hypertension: Secondary | ICD-10-CM | POA: Diagnosis not present

## 2016-12-25 DIAGNOSIS — E785 Hyperlipidemia, unspecified: Secondary | ICD-10-CM | POA: Insufficient documentation

## 2016-12-25 DIAGNOSIS — I6523 Occlusion and stenosis of bilateral carotid arteries: Secondary | ICD-10-CM | POA: Diagnosis not present

## 2016-12-25 DIAGNOSIS — Z951 Presence of aortocoronary bypass graft: Secondary | ICD-10-CM | POA: Insufficient documentation

## 2016-12-25 DIAGNOSIS — I2510 Atherosclerotic heart disease of native coronary artery without angina pectoris: Secondary | ICD-10-CM | POA: Diagnosis not present

## 2016-12-27 ENCOUNTER — Other Ambulatory Visit: Payer: Self-pay | Admitting: Gastroenterology

## 2017-01-05 ENCOUNTER — Other Ambulatory Visit: Payer: Self-pay | Admitting: Family Medicine

## 2017-01-07 DIAGNOSIS — J019 Acute sinusitis, unspecified: Secondary | ICD-10-CM | POA: Diagnosis not present

## 2017-01-07 DIAGNOSIS — K219 Gastro-esophageal reflux disease without esophagitis: Secondary | ICD-10-CM | POA: Diagnosis not present

## 2017-01-07 DIAGNOSIS — J301 Allergic rhinitis due to pollen: Secondary | ICD-10-CM | POA: Diagnosis not present

## 2017-01-07 DIAGNOSIS — R062 Wheezing: Secondary | ICD-10-CM | POA: Diagnosis not present

## 2017-01-08 ENCOUNTER — Other Ambulatory Visit: Payer: Self-pay | Admitting: Family Medicine

## 2017-01-13 ENCOUNTER — Ambulatory Visit (INDEPENDENT_AMBULATORY_CARE_PROVIDER_SITE_OTHER): Payer: Medicare Other | Admitting: Family Medicine

## 2017-01-13 ENCOUNTER — Ambulatory Visit: Payer: Medicare Other

## 2017-01-13 ENCOUNTER — Encounter: Payer: Self-pay | Admitting: Family Medicine

## 2017-01-13 VITALS — BP 120/68 | HR 58 | Temp 98.1°F | Wt 98.6 lb

## 2017-01-13 DIAGNOSIS — M5412 Radiculopathy, cervical region: Secondary | ICD-10-CM

## 2017-01-13 MED ORDER — HYDROCODONE-ACETAMINOPHEN 5-325 MG PO TABS: 1.0000 | ORAL_TABLET | Freq: Four times a day (QID) | ORAL | 0 refills | Status: DC | PRN

## 2017-01-13 NOTE — Patient Instructions (Signed)
Cervical Radiculopathy Cervical radiculopathy happens when a nerve in the neck (cervical nerve) is pinched or bruised. This condition can develop because of an injury or as part of the normal aging process. Pressure on the cervical nerves can cause pain or numbness that runs from the neck all the way down into the arm and fingers. Usually, this condition gets better with rest. Treatment may be needed if the condition does not improve. What are the causes? This condition may be caused by:  Injury.  Slipped (herniated) disk.  Muscle tightness in the neck because of overuse.  Arthritis.  Breakdown or degeneration in the bones and joints of the spine (spondylosis) due to aging.  Bone spurs that may develop near the cervical nerves.  What are the signs or symptoms? Symptoms of this condition include:  Pain that runs from the neck to the arm and hand. The pain can be severe or irritating. It may be worse when the neck is moved.  Numbness or weakness in the affected arm and hand.  How is this diagnosed? This condition may be diagnosed based on symptoms, medical history, and a physical exam. You may also have tests, including:  X-rays.  CT scan.  MRI.  Electromyogram (EMG).  Nerve conduction tests.  How is this treated? In many cases, treatment is not needed for this condition. With rest, the condition usually gets better over time. If treatment is needed, options may include:  Wearing a soft neck collar for short periods of time.  Physical therapy to strengthen your neck muscles.  Medicines, such as NSAIDs, oral corticosteroids, or spinal injections.  Surgery. This may be needed if other treatments do not help. Various types of surgery may be done depending on the cause of your problems.  Follow these instructions at home: Managing pain  Take over-the-counter and prescription medicines only as told by your health care provider.  If directed, apply ice to the affected  area. ? Put ice in a plastic bag. ? Place a towel between your skin and the bag. ? Leave the ice on for 20 minutes, 2-3 times per day.  If ice does not help, you can try using heat. Take a warm shower or warm bath, or use a heat pack as told by your health care provider.  Try a gentle neck and shoulder massage to help relieve symptoms. Activity  Rest as needed. Follow instructions from your health care provider about any restrictions on activities.  Do stretching and strengthening exercises as told by your health care provider or physical therapist. General instructions  If you were given a soft collar, wear it as told by your health care provider.  Use a flat pillow when you sleep.  Keep all follow-up visits as told by your health care provider. This is important. Contact a health care provider if:  Your condition does not improve with treatment. Get help right away if:  Your pain gets much worse and cannot be controlled with medicines.  You have weakness or numbness in your hand, arm, face, or leg.  You have a high fever.  You have a stiff, rigid neck.  You lose control of your bowels or your bladder (have incontinence).  You have trouble with walking, balance, or speaking. This information is not intended to replace advice given to you by your health care provider. Make sure you discuss any questions you have with your health care provider. Document Released: 01/07/2001 Document Revised: 09/20/2015 Document Reviewed: 06/08/2014 Elsevier Interactive Patient Education    2018 Elsevier Inc.  

## 2017-01-13 NOTE — Progress Notes (Signed)
Subjective:     Patient ID: [name], female   DOB: 12/09/1932, 83 y.o.   MRN: [id number]  HPI Patient seen with left upper back, left neck, and left upper extremity pain. Onset couple days ago. She woke up with pain. Denies any injury. Her pain is sometimes sharp and sometimes burning quality and 8 out of 10 severity is worse. Radiates from the base of neck down the left upper extremity all the way to the hand. Denies any numbness or weakness. No fevers or chills. She tried Tylenol, tramadol, and heat without relief. Pain is waking her up at night.  Patient states she had MRI scan about 20 years ago which showed "bulging disc". She's never had a neck surgery. No recent fall. She also try some topical massage without relief.  Patient states she just saw her allergist last week and was given some sort type of cortisone injection and also taking some type of antibiotic for sinus infection  Past Medical History:  Diagnosis Date  . Anemia, pernicious   . Arthritis    bursitis   . Atrophic gastritis   . B12 deficiency   . CAD (coronary artery disease)   . Carotid artery stenosis   . Diarrhea    on and off for months  . Family history of adverse reaction to anesthesia    extreme nausea brother and sister  . Fibromyalgia   . Gallbladder polyp   . GERD (gastroesophageal reflux disease)   . Heart murmur   . History of hiatal hernia   . History of kidney stones   . HLD (hyperlipidemia)   . HTN (hypertension)   . Hypothyroidism   . Irritable bowel syndrome (IBS)   . Median arcuate ligament syndrome ([place])   . PVD (peripheral vascular disease) ([place])   . Tubular adenoma of colon 08/2013   with focal high grade dysplasia   Past Surgical History:  Procedure Laterality Date  . ABDOMINAL HYSTERECTOMY    . APPENDECTOMY    . BIOPSY THYROID    . CHOLECYSTECTOMY N/A 10/24/2014   Procedure: MICROSCOPIC  CHOLECYSTECTOMY;  Surgeon: Ralene Ok, MD;  Location: [facility];  Service: General;   Laterality: N/A;  . CORONARY ARTERY BYPASS GRAFT  07  . ESOPHAGEAL MANOMETRY N/A 08/02/2012   Procedure: ESOPHAGEAL MANOMETRY (EM);  Surgeon: Sable Feil, MD;  Location[facility];  Service: Endoscopy;  Laterality: N/A;  . THYROID LOBECTOMY      reports that she has never smoked. She has never used smokeless tobacco. She reports that she does not drink alcohol or use drugs. family history includes Breast cancer in her other; Cholelithiasis in her mother; Colon cancer in her brother; Diabetes in her sister; Heart disease in her mother; Lung cancer in her father and sister; Prostate cancer in her brother; Stomach cancer in her cousin. Allergies  Allergen Reactions  . Actos [Pioglitazone] Swelling    Swelling  . Sulfonamide Derivatives Swelling  . Codeine Nausea And Vomiting     Review of Systems  Constitutional: Negative for appetite change, chills, fever and unexpected weight change.  Respiratory: Negative for shortness of breath.   Cardiovascular: Negative for chest pain.  Musculoskeletal: Positive for neck pain.  Neurological: Negative for weakness and numbness.       Objective:   Physical Exam  Constitutional: She appears well-developed and well-nourished.  Cardiovascular: Normal rate and regular rhythm.   Pulmonary/Chest: Effort normal and breath sounds normal. No respiratory distress. She has no wheezes. She  has no rales.  Musculoskeletal:  Full range of motion both shoulders. She has pain with lateral bending neck to the left side.  She has some tenderness somewhat bilaterally. No localized shoulder tenderness.  Neurological:  Full strength upper extremities. Symmetric reflexes bilaterally and normal sensory function to touch       Assessment:     Acute onset couple days ago of left-sided cervical radiculitis symptoms. Nonfocal neuro exam    Plan:     -Recent steroid injection as above per allergist. She is reluctant to take any oral prednisone -Limited  Vicodin 5 mg one every 6 hours as need for severe pain as she has not gotten relief with several over-the-counter medications -Follow-up immediately for any numbness or weakness or progressive pain -May need MRI scan cervical spine if not improving of the next several weeks  [name] MD [facility]

## 2017-01-16 ENCOUNTER — Ambulatory Visit (INDEPENDENT_AMBULATORY_CARE_PROVIDER_SITE_OTHER): Payer: Medicare Other | Admitting: Family Medicine

## 2017-01-16 ENCOUNTER — Encounter: Payer: Self-pay | Admitting: Family Medicine

## 2017-01-16 ENCOUNTER — Ambulatory Visit (INDEPENDENT_AMBULATORY_CARE_PROVIDER_SITE_OTHER): Payer: Medicare Other | Admitting: *Deleted

## 2017-01-16 VITALS — BP 150/56 | HR 62 | Temp 98.1°F | Ht 63.0 in | Wt 99.6 lb

## 2017-01-16 DIAGNOSIS — E538 Deficiency of other specified B group vitamins: Secondary | ICD-10-CM

## 2017-01-16 DIAGNOSIS — M5412 Radiculopathy, cervical region: Secondary | ICD-10-CM

## 2017-01-16 MED ORDER — CYANOCOBALAMIN 1000 MCG/ML IJ SOLN
1000.0000 ug | Freq: Once | INTRAMUSCULAR | Status: AC
  Administered 2017-01-16: 1000 ug via INTRAMUSCULAR

## 2017-01-16 NOTE — Progress Notes (Addendum)
Patient here for B12 injection. Patient tolerated injection well.  Agree with the above.  [name] MD [facility] at [facility]

## 2017-01-16 NOTE — Progress Notes (Signed)
Subjective:     Patient ID: [name], female   DOB: 1932/11/08, 83 y.o.   MRN: [id number]  HPI Patient seen with persistent left upper back pain and pain to the base of the cervical spine radiating down the left upper extremity. Refer to recent note from a few days ago.  01-13-17 visit: "Patient seen with left upper back, left neck, and left upper extremity pain. Onset couple days ago. She woke up with pain. Denies any injury. Her pain is sometimes sharp and sometimes burning quality and 8 out of 10 severity is worse. Radiates from the base of neck down the left upper extremity all the way to the hand. Denies any numbness or weakness. No fevers or chills. She tried Tylenol, tramadol, and heat without relief. Pain is waking her up at night.  Patient states she had MRI scan about 20 years ago which showed "bulging disc". She's never had a neck surgery. No recent fall. She also try some topical massage without relief.  Patient states she just saw her allergist last week and was given some sort type of cortisone injection and also taking some type of antibiotic for sinus infection"  Pain is with a really no worse and also no better. She does think she may have little bit of tingling sensation left hand occasionally. No weakness. She's been taking Vicodin about twice per day with some relief of pain.  Past Medical History:  Diagnosis Date  . Anemia, pernicious   . Arthritis    bursitis   . Atrophic gastritis   . B12 deficiency   . CAD (coronary artery disease)   . Carotid artery stenosis   . Diarrhea    on and off for months  . Family history of adverse reaction to anesthesia    extreme nausea brother and sister  . Fibromyalgia   . Gallbladder polyp   . GERD (gastroesophageal reflux disease)   . Heart murmur   . History of hiatal hernia   . History of kidney stones   . HLD (hyperlipidemia)   . HTN (hypertension)   . Hypothyroidism   . Irritable bowel syndrome (IBS)   . Median  arcuate ligament syndrome ([place])   . PVD (peripheral vascular disease) ([place])   . Tubular adenoma of colon 08/2013   with focal high grade dysplasia   Past Surgical History:  Procedure Laterality Date  . ABDOMINAL HYSTERECTOMY    . APPENDECTOMY    . BIOPSY THYROID    . CHOLECYSTECTOMY N/A 10/24/2014   Procedure: MICROSCOPIC  CHOLECYSTECTOMY;  Surgeon: Ralene Ok, MD;  Location: [facility];  Service: General;  Laterality: N/A;  . CORONARY ARTERY BYPASS GRAFT  07  . ESOPHAGEAL MANOMETRY N/A 08/02/2012   Procedure: ESOPHAGEAL MANOMETRY (EM);  Surgeon: Sable Feil, MD;  Location: [facility];  Service: Endoscopy;  Laterality: N/A;  . THYROID LOBECTOMY      reports that she has never smoked. She has never used smokeless tobacco. She reports that she does not drink alcohol or use drugs. family history includes Breast cancer in her other; Cholelithiasis in her mother; Colon cancer in her brother; Diabetes in her sister; Heart disease in her mother; Lung cancer in her father and sister; Prostate cancer in her brother; Stomach cancer in her cousin. Allergies  Allergen Reactions  . Actos [Pioglitazone] Swelling    Swelling  . Sulfonamide Derivatives Swelling  . Codeine Nausea And Vomiting     Review of Systems  Constitutional: Negative for chills  and fever.  Respiratory: Negative for cough and shortness of breath.   Cardiovascular: Negative for chest pain.  Musculoskeletal: Positive for neck pain.  Neurological: Negative for weakness.       Objective:   Physical Exam  Constitutional: She appears well-developed and well-nourished.  Cardiovascular: Normal rate and regular rhythm.   Musculoskeletal: She exhibits no edema.  Mild tenderness left trapezius  Neurological:  Full strength upper extremities. Symmetric reflexes.       Assessment:     Left upper back pain and left suspected cervical radiculitis symptoms. Neuro exam remains nonfocal.    Plan:     -We again reiterated  that imminent indications for MRI would be progressive neurologic deficits such as weakness or unbearable pain but current pain has been manageable with Vicodin -Touch base by next week if no further improved  [name] MD [facility] at [facility]

## 2017-01-16 NOTE — Patient Instructions (Signed)
Touch base by next week if pain no better Watch out for any weakness or numbness (hand or arm)

## 2017-01-23 ENCOUNTER — Ambulatory Visit: Payer: Medicare Other | Admitting: Gastroenterology

## 2017-01-26 ENCOUNTER — Telehealth: Payer: Self-pay | Admitting: Family Medicine

## 2017-01-26 NOTE — Telephone Encounter (Signed)
Pt is doing better and pain is still radiating down her L arm and the pain has decreased tremendously and later in the day she state is when the pain increases.  Pt state that this morning that she got a little dizzy when she was sitting and had blurred and dim vision.  Pt state that the dizziness is not all the time and think that it may be when her Bp is dropping when standing.  Pt would like to have a call back to discuss in detail.

## 2017-01-26 NOTE — Telephone Encounter (Signed)
We probably need to get her back in and check some orthostatics.

## 2017-01-26 NOTE — Telephone Encounter (Signed)
Would you like for her to schedule an office visit?

## 2017-01-27 ENCOUNTER — Ambulatory Visit (INDEPENDENT_AMBULATORY_CARE_PROVIDER_SITE_OTHER): Payer: Medicare Other | Admitting: Family Medicine

## 2017-01-27 ENCOUNTER — Encounter: Payer: Self-pay | Admitting: Family Medicine

## 2017-01-27 VITALS — BP 180/80 | HR 66 | Temp 98.3°F

## 2017-01-27 DIAGNOSIS — M5412 Radiculopathy, cervical region: Secondary | ICD-10-CM | POA: Diagnosis not present

## 2017-01-27 DIAGNOSIS — I10 Essential (primary) hypertension: Secondary | ICD-10-CM

## 2017-01-27 MED ORDER — HYDRALAZINE HCL 25 MG PO TABS: 25.0000 mg | ORAL_TABLET | Freq: Three times a day (TID) | ORAL | 1 refills | Status: DC

## 2017-01-27 NOTE — Progress Notes (Signed)
Subjective:     Patient ID: [name], female   DOB: March 11, 1933, 83 y.o.   MRN: [id number]  HPI Patient here to follow-up regarding her recent left lower cervical neck pain with leftward a colitis symptoms. We have prescribed some hydrocodone which has not helped much. Her symptoms are somewhat intermittent. Worse with movement. No weakness. On side around middle of September. She's not had MRI of the cervical spine several years. Denies any consistent numbness of the left upper extremity.  Patient has chronic problems including hypertension, history of for for vascular disease, osteoporosis.  She is on several blood pressure medications including amlodipine, carvedilol, losartan, and Aldactone. Compliant with medications. She's had some general fatigue lately but no headaches. No exertional chest pains.  Past Medical History:  Diagnosis Date  . Anemia, pernicious   . Arthritis    bursitis   . Atrophic gastritis   . B12 deficiency   . CAD (coronary artery disease)   . Carotid artery stenosis   . Diarrhea    on and off for months  . Family history of adverse reaction to anesthesia    extreme nausea brother and sister  . Fibromyalgia   . Gallbladder polyp   . GERD (gastroesophageal reflux disease)   . Heart murmur   . History of hiatal hernia   . History of kidney stones   . HLD (hyperlipidemia)   . HTN (hypertension)   . Hypothyroidism   . Irritable bowel syndrome (IBS)   . Median arcuate ligament syndrome ([place])   . PVD (peripheral vascular disease) ([place])   . Tubular adenoma of colon 08/2013   with focal high grade dysplasia   Past Surgical History:  Procedure Laterality Date  . ABDOMINAL HYSTERECTOMY    . APPENDECTOMY    . BIOPSY THYROID    . CHOLECYSTECTOMY N/A 10/24/2014   Procedure: MICROSCOPIC  CHOLECYSTECTOMY;  Surgeon: Ralene Ok, MD;  Location: [facility];  Service: General;  Laterality: N/A;  . CORONARY ARTERY BYPASS GRAFT  07  . ESOPHAGEAL MANOMETRY N/A  08/02/2012   Procedure: ESOPHAGEAL MANOMETRY (EM);  Surgeon: Sable Feil, MD;  Location: [facility];  Service: Endoscopy;  Laterality: N/A;  . THYROID LOBECTOMY      reports that she has never smoked. She has never used smokeless tobacco. She reports that she does not drink alcohol or use drugs. family history includes Breast cancer in her other; Cholelithiasis in her mother; Colon cancer in her brother; Diabetes in her sister; Heart disease in her mother; Lung cancer in her father and sister; Prostate cancer in her brother; Stomach cancer in her cousin. Allergies  Allergen Reactions  . Actos [Pioglitazone] Swelling    Swelling  . Sulfonamide Derivatives Swelling  . Codeine Nausea And Vomiting      Review of Systems  Constitutional: Negative for fatigue.  Eyes: Negative for visual disturbance.  Respiratory: Negative for cough, chest tightness, shortness of breath and wheezing.   Cardiovascular: Negative for chest pain, palpitations and leg swelling.  Musculoskeletal: Positive for neck pain.  Neurological: Negative for dizziness, seizures, syncope, weakness, light-headedness and headaches.       Objective:   Physical Exam  Constitutional: She appears well-developed and well-nourished.  Eyes: Pupils are equal, round, and reactive to light.  Neck: Neck supple. No JVD present. No thyromegaly present.  Cardiovascular: Normal rate and regular rhythm.  Exam reveals no gallop.   Pulmonary/Chest: Effort normal and breath sounds normal. No respiratory distress. She has no wheezes. She  has no rales.  Musculoskeletal: She exhibits no edema.  Neurological: She is alert.  Strength remains full upper extremities with symmetric reflexes. Normal sensory function throughout.       Assessment:     #1 hypertension significantly elevated today with 532 systolic and this was confirmed by repeat reading. Her diastolics around 80. ?exacerbated by her neck pain.  She also had recent systolic  reading 023 couple weeks ago  #2 persistent left cervical neck pains with radiculitis symptoms without focal neurologic deficits    Plan:     -We discussed her blood pressure at some length. She is already on 4  blood pressure medications. We recommended trial of hydralazine 25 mg 3 times a day and office follow-up within 2 weeks to reassess -Watch sodium intake closely and keep less than 2000 mg daily -We discussed possible indications for MRI cervical spine. At this point, she works does not have any neurologic deficits  [name] MD [facility] at [facility]

## 2017-01-27 NOTE — Telephone Encounter (Signed)
Patient is aware and an appointment made 

## 2017-02-10 ENCOUNTER — Ambulatory Visit (INDEPENDENT_AMBULATORY_CARE_PROVIDER_SITE_OTHER): Payer: Medicare Other | Admitting: Family Medicine

## 2017-02-10 ENCOUNTER — Encounter: Payer: Self-pay | Admitting: Family Medicine

## 2017-02-10 VITALS — BP 158/64 | HR 68 | Temp 98.6°F | Wt 99.8 lb

## 2017-02-10 DIAGNOSIS — I10 Essential (primary) hypertension: Secondary | ICD-10-CM

## 2017-02-10 DIAGNOSIS — Z23 Encounter for immunization: Secondary | ICD-10-CM | POA: Diagnosis not present

## 2017-02-10 DIAGNOSIS — Z862 Personal history of diseases of the blood and blood-forming organs and certain disorders involving the immune mechanism: Secondary | ICD-10-CM

## 2017-02-10 DIAGNOSIS — M5412 Radiculopathy, cervical region: Secondary | ICD-10-CM

## 2017-02-10 MED ORDER — CYANOCOBALAMIN 1000 MCG/ML IJ SOLN
1000.0000 ug | Freq: Once | INTRAMUSCULAR | Status: AC
  Administered 2017-02-10: 1000 ug via INTRAMUSCULAR

## 2017-02-10 MED ORDER — HYDRALAZINE HCL 50 MG PO TABS: 50.0000 mg | ORAL_TABLET | Freq: Three times a day (TID) | ORAL | 5 refills | Status: DC

## 2017-02-10 NOTE — Progress Notes (Signed)
Subjective:     Patient ID: [name], female   DOB: [birth year], 83 y.o.   MRN: [id number]  HPI Patient seen for follow up regarding hypertension and ongoing left-sided neck pain with left cervical radiculitis type symptoms.  Refer to multiple recent notes. She's had several weeks now of pain radiating to the base of her neck down to her hand at times. Pain is intense at times. No alleviating factors. No exacerbating factors. Pain radiates occasionally toward the left scapular region but mostly into the shoulder and down to the hand. Denies recent injury. Sometimes interferes with sleep. Interfering with daily activities.  Hypertension. Poorly controlled. Seen a few weeks ago with systolic blood pressure 371 confirmed by repeat after rest. We started hydralazine 25 motor 3 times a day. Tolerating without side effects. Home blood pressures mostly 696V systolic  Past Medical History:  Diagnosis Date  . Anemia, pernicious   . Arthritis    bursitis   . Atrophic gastritis   . B12 deficiency   . CAD (coronary artery disease)   . Carotid artery stenosis   . Diarrhea    on and off for months  . Family history of adverse reaction to anesthesia    extreme nausea brother and sister  . Fibromyalgia   . Gallbladder polyp   . GERD (gastroesophageal reflux disease)   . Heart murmur   . History of hiatal hernia   . History of kidney stones   . HLD (hyperlipidemia)   . HTN (hypertension)   . Hypothyroidism   . Irritable bowel syndrome (IBS)   . Median arcuate ligament syndrome ([place])   . PVD (peripheral vascular disease) ([place])   . Tubular adenoma of colon 08/2013   with focal high grade dysplasia   Past Surgical History:  Procedure Laterality Date  . ABDOMINAL HYSTERECTOMY    . APPENDECTOMY    . BIOPSY THYROID    . CHOLECYSTECTOMY N/A 10/24/2014   Procedure: MICROSCOPIC  CHOLECYSTECTOMY;  Surgeon: Ralene Ok, MD;  Location: [facility];  Service: General;  Laterality: N/A;  . CORONARY  ARTERY BYPASS GRAFT  07  . ESOPHAGEAL MANOMETRY N/A 08/02/2012   Procedure: ESOPHAGEAL MANOMETRY (EM);  Surgeon: Sable Feil, MD;  Location: [facility];  Service: Endoscopy;  Laterality: N/A;  . THYROID LOBECTOMY      reports that she has never smoked. She has never used smokeless tobacco. She reports that she does not drink alcohol or use drugs. family history includes Breast cancer in her other; Cholelithiasis in her mother; Colon cancer in her brother; Diabetes in her sister; Heart disease in her mother; Lung cancer in her father and sister; Prostate cancer in her brother; Stomach cancer in her cousin. Allergies  Allergen Reactions  . Actos [Pioglitazone] Swelling    Swelling  . Sulfonamide Derivatives Swelling  . Codeine Nausea And Vomiting     Review of Systems  Constitutional: Negative for fatigue.  Eyes: Negative for visual disturbance.  Respiratory: Negative for cough, chest tightness, shortness of breath and wheezing.   Cardiovascular: Negative for chest pain, palpitations and leg swelling.  Musculoskeletal: Positive for neck pain.  Neurological: Negative for dizziness, seizures, syncope, weakness, light-headedness and headaches.       Objective:   Physical Exam  Constitutional: She appears well-developed and well-nourished.  Neck:  Patient has pain with lateral bending of the neck to the left side greater than right. Minimal pain with neck flexion and extension  Cardiovascular: Normal rate and regular rhythm.  Pulmonary/Chest: Effort normal and breath sounds normal. No respiratory distress. She has no wheezes. She has no rales.  Musculoskeletal: She exhibits no edema.  Neurological:  Full strength upper extremities. Symmetric reflexes. Normal sensory function throughout.       Assessment:     #1 hypertension improved but not to goal  #2 several week history of cervical neck pains with left upper extremity radiculitis symptoms not improving with conservative  therapy    Plan:     -Increase hydralazine to 50 mg 3 times a day. Continue all of her other regular blood pressure medications -Reassess blood pressures in 3-4 weeks and continue home monitoring -Set up MRI cervical spine to further assess  [name] MD [facility] at [facility]

## 2017-02-10 NOTE — Patient Instructions (Signed)
Go ahead and increase the Hydralazine to 50 mg three times daily We will set up the MRI.

## 2017-02-16 ENCOUNTER — Other Ambulatory Visit: Payer: Self-pay | Admitting: *Deleted

## 2017-02-16 ENCOUNTER — Other Ambulatory Visit: Payer: Self-pay | Admitting: Family Medicine

## 2017-02-16 DIAGNOSIS — I6523 Occlusion and stenosis of bilateral carotid arteries: Secondary | ICD-10-CM

## 2017-02-23 ENCOUNTER — Ambulatory Visit
Admission: RE | Admit: 2017-02-23 | Discharge: 2017-02-23 | Disposition: A | Discharge status: Home / Self Care | Payer: Medicare Other | Source: Ambulatory Visit | Attending: Family Medicine | Admitting: Family Medicine

## 2017-02-23 DIAGNOSIS — M5412 Radiculopathy, cervical region: Secondary | ICD-10-CM

## 2017-02-23 DIAGNOSIS — M4802 Spinal stenosis, cervical region: Secondary | ICD-10-CM | POA: Diagnosis not present

## 2017-03-03 ENCOUNTER — Other Ambulatory Visit: Payer: Self-pay | Admitting: Family Medicine

## 2017-03-03 DIAGNOSIS — R9389 Abnormal findings on diagnostic imaging of other specified body structures: Secondary | ICD-10-CM

## 2017-03-11 ENCOUNTER — Encounter: Payer: Self-pay | Admitting: Family Medicine

## 2017-03-11 ENCOUNTER — Ambulatory Visit (INDEPENDENT_AMBULATORY_CARE_PROVIDER_SITE_OTHER): Payer: Medicare Other | Admitting: Family Medicine

## 2017-03-11 VITALS — BP 140/40 | HR 65 | Temp 97.8°F | Wt 103.8 lb

## 2017-03-11 DIAGNOSIS — M4722 Other spondylosis with radiculopathy, cervical region: Secondary | ICD-10-CM | POA: Diagnosis not present

## 2017-03-11 DIAGNOSIS — Z862 Personal history of diseases of the blood and blood-forming organs and certain disorders involving the immune mechanism: Secondary | ICD-10-CM

## 2017-03-11 DIAGNOSIS — I10 Essential (primary) hypertension: Secondary | ICD-10-CM

## 2017-03-11 DIAGNOSIS — M47812 Spondylosis without myelopathy or radiculopathy, cervical region: Secondary | ICD-10-CM | POA: Insufficient documentation

## 2017-03-11 MED ORDER — CYANOCOBALAMIN 1000 MCG/ML IJ SOLN
1000.0000 ug | Freq: Once | INTRAMUSCULAR | Status: AC
  Administered 2017-03-11: 1000 ug via INTRAMUSCULAR

## 2017-03-11 NOTE — Patient Instructions (Signed)
Your blood pressure is better Let me know if you do not hear back from neurosurgeon in one week.

## 2017-03-11 NOTE — Progress Notes (Signed)
Subjective:     Patient ID: [name], female   DOB: 1932/07/29, 83 y.o.   MRN: [id number]  HPI   Patient's has some ongoing left cervical radiculitis symptoms. Present for several months and progressive. She is still waiting to hear back from neurosurgeon. No significant change in symptoms. Pain is impairing her daily activities.  She's here for follow-up hypertension. We had recently started hydralazine and titrated up to 50 mg 3 times a day. She denies any side effects. Home blood pressures mostly ranging 841L to 244W systolic. Denies any medication side effects. She takes several other blood pressure medications including Aldactone, losartan, amlodipine, and carvedilol. Denies any headaches. No dizziness. No orthostatic symptoms.  History of B12 deficiency and requesting repeat B12 injection.  She had B12 level done in June which was over 500.  Past Medical History:  Diagnosis Date  . Anemia, pernicious   . Arthritis    bursitis   . Atrophic gastritis   . B12 deficiency   . CAD (coronary artery disease)   . Carotid artery stenosis   . Diarrhea    on and off for months  . Family history of adverse reaction to anesthesia    extreme nausea brother and sister  . Fibromyalgia   . Gallbladder polyp   . GERD (gastroesophageal reflux disease)   . Heart murmur   . History of hiatal hernia   . History of kidney stones   . HLD (hyperlipidemia)   . HTN (hypertension)   . Hypothyroidism   . Irritable bowel syndrome (IBS)   . Median arcuate ligament syndrome ([place])   . PVD (peripheral vascular disease) ([place])   . Tubular adenoma of colon 08/2013   with focal high grade dysplasia   Past Surgical History:  Procedure Laterality Date  . ABDOMINAL HYSTERECTOMY    . APPENDECTOMY    . BIOPSY THYROID    . CORONARY ARTERY BYPASS GRAFT  07  . THYROID LOBECTOMY      reports that  has never smoked. she has never used smokeless tobacco. She reports that she does not drink alcohol or use  drugs. family history includes Breast cancer in her other; Cholelithiasis in her mother; Colon cancer in her brother; Diabetes in her sister; Heart disease in her mother; Lung cancer in her father and sister; Prostate cancer in her brother; Stomach cancer in her cousin. Allergies  Allergen Reactions  . Actos [Pioglitazone] Swelling    Swelling  . Sulfonamide Derivatives Swelling  . Codeine Nausea And Vomiting     Review of Systems  Constitutional: Negative for fatigue.  Eyes: Negative for visual disturbance.  Respiratory: Negative for cough, chest tightness, shortness of breath and wheezing.   Cardiovascular: Negative for chest pain, palpitations and leg swelling.  Endocrine: Negative for polydipsia and polyuria.  Musculoskeletal: Positive for neck pain.  Neurological: Negative for dizziness, seizures, syncope, weakness, light-headedness and headaches.       Objective:   Physical Exam  Constitutional: She appears well-developed and well-nourished.  Eyes: Pupils are equal, round, and reactive to light.  Neck: Neck supple. No JVD present. No thyromegaly present.  Cardiovascular: Normal rate and regular rhythm. Exam reveals no gallop.  Pulmonary/Chest: Effort normal and breath sounds normal. No respiratory distress. She has no wheezes. She has no rales.  Musculoskeletal: She exhibits no edema.  Neurological: She is alert.       Assessment:     #1 Hypertension. Improved with follow-up left arm seated after rest 140/40  #  2 B12 deficiency. Injection given today per nurse  #3 cervical spondylosis at multiple levels with left cervical radiculitis symptoms.    Plan:     -Continue current blood pressure medication regimen -Continue low-sodium diet -Routine follow-up in 3 months and sooner as needed -We had our office call neurosurgical office and they state they will be in touch with her within one week regarding appointment -Continue to monitor blood pressure at home and be in  touch if systolic consistently over 140.  [name] MD [facility] at [facility]

## 2017-03-16 ENCOUNTER — Encounter: Payer: Self-pay | Admitting: Cardiovascular Disease

## 2017-03-16 ENCOUNTER — Ambulatory Visit: Payer: Medicare Other | Admitting: Cardiovascular Disease

## 2017-03-16 VITALS — BP 140/52 | HR 66 | Ht 63.5 in | Wt 101.8 lb

## 2017-03-16 DIAGNOSIS — I10 Essential (primary) hypertension: Secondary | ICD-10-CM

## 2017-03-16 DIAGNOSIS — I2510 Atherosclerotic heart disease of native coronary artery without angina pectoris: Secondary | ICD-10-CM

## 2017-03-16 DIAGNOSIS — E785 Hyperlipidemia, unspecified: Secondary | ICD-10-CM | POA: Diagnosis not present

## 2017-03-16 DIAGNOSIS — I340 Nonrheumatic mitral (valve) insufficiency: Secondary | ICD-10-CM | POA: Diagnosis not present

## 2017-03-16 NOTE — Progress Notes (Signed)
Cardiology Office Note Date:  03/16/2017   ID:  [name], DOB 1932/07/17, MRN [id number]  PCP:  [name], MD  Cardiologist:  Janne Lab, MD    Chief Complaint  Patient presents with  . essential hypertension     History of Present Illness: [name] is a 83 y.o. female who presents for follow-up evaluation. She has CAD with history of CABG in 2007, moderate mitral regurgitation, and asymptomatic carotid stenosis.   She denies shortness of breath, but complains of heart palpitations.   She has been having pain in the back, down the left arm. Has had imaging studies done and awaiting neurosurgical evaluation.  She has had some pains going from the back forward into the chest.  She feels like this is probably related to her arthritis.  She does not feel the way she felt before bypass surgery.  She denies symptoms of exertional chest pain or pressure.  She denies orthopnea or PND.  She has mild swelling in her right ankle but states she has had this ever since veins were harvested from that leg.   Past Medical History:  Diagnosis Date  . Anemia, pernicious   . Arthritis    bursitis   . Atrophic gastritis   . B12 deficiency   . CAD (coronary artery disease)   . Carotid artery stenosis   . Diarrhea    on and off for months  . Family history of adverse reaction to anesthesia    extreme nausea brother and sister  . Fibromyalgia   . Gallbladder polyp   . GERD (gastroesophageal reflux disease)   . Heart murmur   . History of hiatal hernia   . History of kidney stones   . HLD (hyperlipidemia)   . HTN (hypertension)   . Hypothyroidism   . Irritable bowel syndrome (IBS)   . Median arcuate ligament syndrome ([place])   . PVD (peripheral vascular disease) ([place])   . Tubular adenoma of colon 08/2013   with focal high grade dysplasia    Past Surgical History:  Procedure Laterality Date  . ABDOMINAL HYSTERECTOMY    . APPENDECTOMY    . BIOPSY THYROID    .  CORONARY ARTERY BYPASS GRAFT  07  . ESOPHAGEAL MANOMETRY (EM) N/A 08/02/2012   Performed by Sable Feil, MD at [facility]  . MICROSCOPIC  CHOLECYSTECTOMY N/A 10/24/2014   Performed by Ralene Ok, MD at [facility]      Current Outpatient Medications  Medication Sig Dispense Refill  . acetaminophen (TYLENOL) 500 MG tablet Take 500 mg by mouth every 6 (six) hours as needed (pain).    Marland Kitchen amLODipine (NORVASC) 10 MG tablet TAKE 1 TABLET BY MOUTH DAILY 90 tablet 1  . aspirin EC 81 MG tablet Take 81 mg by mouth at bedtime.    Marland Kitchen atorvastatin (LIPITOR) 80 MG tablet TAKE 1 TABLET BY MOUTH AT BEDTIME 90 tablet 3  . azelastine (ASTELIN) 0.1 % nasal spray Place 1 spray into both nostrils 2 (two) times daily as needed for rhinitis or allergies. Use in each nostril as directed    . Calcium Carbonate-Vitamin D (CALTRATE 600+D PO) Take 600 mg by mouth daily with lunch.    Marland Kitchen CARAFATE 1 GM/10ML suspension TAKE 10 MLS (1 G TOTAL) BY MOUTH 2 (TWO) TIMES DAILY. 420 mL 1  . carvedilol (COREG) 6.25 MG tablet TAKE 1/2 TABLET BY MOUTH 2 TIMES DAILY. 60 tablet 5  . Cholecalciferol (  VITAMIN D) 2000 UNITS tablet Take 2,000 Units by mouth daily with lunch.    . cholestyramine (QUESTRAN) 4 GM/DOSE powder Take 1 packet (4 g total) by mouth daily. 120 g 11  . cyanocobalamin (,VITAMIN B-12,) 1000 MCG/ML injection Inject 1,000 mcg into the muscle every 30 (thirty) days. On or about the end of each month    . Cyanocobalamin (B-12) 1000 MCG/ML KIT cyanocobalamin (vit B-12) 1,000 mcg/mL injection kit  Inject 1 mL every month by intramuscular route.    . fluticasone (FLONASE) 50 MCG/ACT nasal spray Place 1 spray daily as needed into both nostrils for allergies or rhinitis.    . hydrALAZINE (APRESOLINE) 50 MG tablet Take 1 tablet (50 mg total) by mouth 3 (three) times daily. 90 tablet 5  . levothyroxine (SYNTHROID, LEVOTHROID) 50 MCG tablet Take 50 mcg by mouth daily before breakfast.    . losartan  (COZAAR) 100 MG tablet TAKE ONE TABLET BY MOUTH DAILY 90 tablet 1  . Multiple Vitamins-Minerals (OCUVITE-LUTEIN PO) PreserVision AREDS 2    . Multiple Vitamins-Minerals (PRESERVISION AREDS 2) CAPS Take 1 tablet by mouth daily with lunch.    Marland Kitchen OVER THE COUNTER MEDICATION Place 1 drop into both eyes 2 (two) times daily as needed (dry eyes). Preservative free over the counter eye drop for dry eyes    . POTASSIUM PO potassium    . sodium chloride (OCEAN) 0.65 % nasal spray Place 1 spray into both nostrils 3 (three) times daily as needed for congestion.     Marland Kitchen spironolactone (ALDACTONE) 25 MG tablet TAKE 1 TABLET BY MOUTH ONCE DAILY 90 tablet 0  . traMADol (ULTRAM) 50 MG tablet Take 1 tablet (50 mg total) by mouth every 6 (six) hours as needed (pain). 40 tablet 1  . zoledronic acid (RECLAST) 5 MG/100ML SOLN injection Reclast     No current facility-administered medications for this visit.     Allergies:   Actos [pioglitazone]; Sulfonamide derivatives; and Codeine   Social History:  The patient  reports that  has never smoked. she has never used smokeless tobacco. She reports that she does not drink alcohol or use drugs.   Family History:  The patient's family history includes Breast cancer in her other; Cholelithiasis in her mother; Colon cancer in her brother; Diabetes in her sister; Heart disease in her mother; Lung cancer in her father and sister; Prostate cancer in her brother; Stomach cancer in her cousin.    ROS:  Please see the history of present illness.  Otherwise, review of systems is positive for diarrhea, back pain, irregular heartbeats.  All other systems are reviewed and negative.    PHYSICAL EXAM: VS:  BP (!) 140/52   Pulse 66   Ht 5' 3.5" (1.613 m)   Wt 101 lb 12.8 oz (46.2 kg)   BMI 17.75 kg/m  , BMI Body mass index is 17.75 kg/m. GEN: Well nourished, well developed, in no acute distress  HEENT: normal  Neck: no JVD, no masses. bilateral carotid bruits Cardiac: RRR with  2/6 Systolic murmur at the RUSB and LLSB               Respiratory:  clear to auscultation bilaterally, normal work of breathing GI: soft, nontender, nondistended, + BS MS: no deformity or atrophy  Ext: trace right ankle edema, pedal pulses 2+= bilaterally Skin: warm and dry, no rash Neuro:  Strength and sensation are intact Psych: euthymic mood, full affect  EKG:  EKG is ordered today. The ekg  ordered today shows normal sinus rhythm 66 bpm, left bundle branch block  Recent Labs: 03/25/2016: ALT 21; Creatinine 0.9; Potassium 4.4; Sodium 142 11/18/2016: BUN 24; TSH 3.80   Lipid Panel     Component Value Date/Time   CHOL 135 11/18/2016   TRIG 54 11/18/2016   TRIG 85 02/05/2006 0818   HDL 69.20 11/22/2014 0919   CHOLHDL 2 11/22/2014 0919   VLDL 10.6 11/22/2014 0919   LDLCALC 66 11/22/2014 0919      Wt Readings from Last 3 Encounters:  03/16/17 101 lb 12.8 oz (46.2 kg)  03/11/17 103 lb 12.8 oz (47.1 kg)  02/10/17 99 lb 12.8 oz (45.3 kg)     Cardiac Studies Reviewed: 2D Echo 06-22-2014: Study Conclusions  - Left ventricle: The cavity size was normal. Systolic function was normal. The estimated ejection fraction was in the range of 55% to 60%. Wall motion was normal; there were no regional wall motion abnormalities. Features are consistent with a pseudonormal left ventricular filling pattern, with concomitant abnormal relaxation and increased filling pressure (grade 2 diastolic dysfunction). - Ventricular septum: Septal motion showed moderate paradox. These changes are consistent with intraventricular conduction delay. - Aortic valve: Moderate thickening and calcification, consistent with sclerosis. There was moderate regurgitation. - Mitral valve: Mildly thickened leaflets . There was mild regurgitation. - Atrial septum: There was increased thickness of the septum, consistent with lipomatous hypertrophy. - Tricuspid valve: There was mild  regurgitation.  ASSESSMENT AND PLAN: 1.  Coronary artery disease, native vessel: Patient appears stable.  She is not having any symptoms like those that predated her bypass surgery.  I suspect the chest pain that she is experiencing which begins in her back is related to her spine disease as she is having a lot of problems with this at present.  She will continue current medicines.  2.  Hypertension: Blood pressure is controlled on a combination of amlodipine, carvedilol, hydralazine, and losartan.  The patient always has a wide pulse pressure likely related to decreased arterial compliance.  3.  Moderate valvular disease: The patient's last echo is reviewed which demonstrated mild to moderate mitral regurgitation and moderate aortic insufficiency.  Recommend a follow-up echocardiogram as it has been over 2 years since her last study.  Current medicines are reviewed with the patient today.  The patient does not have concerns regarding medicines.  Labs/ tests ordered today include:   Orders Placed This Encounter  Procedures  . EKG 12-Lead  . ECHOCARDIOGRAM COMPLETE    Disposition:   FU one year  Signed, Janne Lab, MD  03/16/2017 5:00 PM    [address] [address], [place], [place]  [address] Phone: [phone]; Fax: [phone]

## 2017-03-16 NOTE — Patient Instructions (Signed)
Medication Instructions:  Your provider recommends that you continue on your current medications as directed. Please refer to the Current Medication list given to you today.    Labwork: None  Testing/Procedures: Your provider has requested that you have an echocardiogram. Echocardiography is a painless test that uses sound waves to create images of your heart. It provides your doctor with information about the size and shape of your heart and how well your heart's chambers and valves are working. This procedure takes approximately one hour. There are no restrictions for this procedure.  Follow-Up: Your provider wants you to follow-up in: 1 year with Dr. Cooper. You will receive a reminder letter in the mail two months in advance. If you don't receive a letter, please call our office to schedule the follow-up appointment.    Any Other Special Instructions Will Be Listed Below (If Applicable).     If you need a refill on your cardiac medications before your next appointment, please call your pharmacy.   

## 2017-03-26 ENCOUNTER — Ambulatory Visit (HOSPITAL_COMMUNITY): Payer: Medicare Other | Attending: Internal Medicine

## 2017-03-26 ENCOUNTER — Other Ambulatory Visit: Payer: Self-pay

## 2017-03-26 ENCOUNTER — Ambulatory Visit: Payer: Medicare Other | Admitting: Podiatry

## 2017-03-26 DIAGNOSIS — Z951 Presence of aortocoronary bypass graft: Secondary | ICD-10-CM | POA: Diagnosis not present

## 2017-03-26 DIAGNOSIS — R011 Cardiac murmur, unspecified: Secondary | ICD-10-CM | POA: Diagnosis not present

## 2017-03-26 DIAGNOSIS — I081 Rheumatic disorders of both mitral and tricuspid valves: Secondary | ICD-10-CM | POA: Insufficient documentation

## 2017-03-26 DIAGNOSIS — I340 Nonrheumatic mitral (valve) insufficiency: Secondary | ICD-10-CM | POA: Diagnosis not present

## 2017-03-26 DIAGNOSIS — Z8249 Family history of ischemic heart disease and other diseases of the circulatory system: Secondary | ICD-10-CM | POA: Insufficient documentation

## 2017-03-26 DIAGNOSIS — E785 Hyperlipidemia, unspecified: Secondary | ICD-10-CM | POA: Insufficient documentation

## 2017-03-26 DIAGNOSIS — I739 Peripheral vascular disease, unspecified: Secondary | ICD-10-CM | POA: Diagnosis not present

## 2017-03-26 DIAGNOSIS — I10 Essential (primary) hypertension: Secondary | ICD-10-CM | POA: Insufficient documentation

## 2017-03-26 DIAGNOSIS — I2510 Atherosclerotic heart disease of native coronary artery without angina pectoris: Secondary | ICD-10-CM | POA: Diagnosis not present

## 2017-03-26 DIAGNOSIS — R002 Palpitations: Secondary | ICD-10-CM | POA: Diagnosis not present

## 2017-03-26 DIAGNOSIS — M5021 Other cervical disc displacement,  high cervical region: Secondary | ICD-10-CM | POA: Diagnosis not present

## 2017-03-31 DIAGNOSIS — M2560 Stiffness of unspecified joint, not elsewhere classified: Secondary | ICD-10-CM | POA: Diagnosis not present

## 2017-03-31 DIAGNOSIS — R293 Abnormal posture: Secondary | ICD-10-CM | POA: Diagnosis not present

## 2017-03-31 DIAGNOSIS — M5412 Radiculopathy, cervical region: Secondary | ICD-10-CM | POA: Diagnosis not present

## 2017-03-31 DIAGNOSIS — M542 Cervicalgia: Secondary | ICD-10-CM | POA: Diagnosis not present

## 2017-04-01 ENCOUNTER — Other Ambulatory Visit: Payer: Self-pay | Admitting: Family Medicine

## 2017-04-02 ENCOUNTER — Ambulatory Visit: Payer: Medicare Other | Admitting: Podiatry

## 2017-04-02 DIAGNOSIS — M5412 Radiculopathy, cervical region: Secondary | ICD-10-CM | POA: Diagnosis not present

## 2017-04-02 DIAGNOSIS — M2560 Stiffness of unspecified joint, not elsewhere classified: Secondary | ICD-10-CM | POA: Diagnosis not present

## 2017-04-02 DIAGNOSIS — M542 Cervicalgia: Secondary | ICD-10-CM | POA: Diagnosis not present

## 2017-04-02 DIAGNOSIS — R293 Abnormal posture: Secondary | ICD-10-CM | POA: Diagnosis not present

## 2017-04-09 DIAGNOSIS — R293 Abnormal posture: Secondary | ICD-10-CM | POA: Diagnosis not present

## 2017-04-09 DIAGNOSIS — M5412 Radiculopathy, cervical region: Secondary | ICD-10-CM | POA: Diagnosis not present

## 2017-04-09 DIAGNOSIS — M2560 Stiffness of unspecified joint, not elsewhere classified: Secondary | ICD-10-CM | POA: Diagnosis not present

## 2017-04-09 DIAGNOSIS — M542 Cervicalgia: Secondary | ICD-10-CM | POA: Diagnosis not present

## 2017-04-13 ENCOUNTER — Other Ambulatory Visit: Payer: Self-pay | Admitting: Family Medicine

## 2017-04-13 DIAGNOSIS — M2560 Stiffness of unspecified joint, not elsewhere classified: Secondary | ICD-10-CM | POA: Diagnosis not present

## 2017-04-13 DIAGNOSIS — M5412 Radiculopathy, cervical region: Secondary | ICD-10-CM | POA: Diagnosis not present

## 2017-04-13 DIAGNOSIS — R293 Abnormal posture: Secondary | ICD-10-CM | POA: Diagnosis not present

## 2017-04-13 DIAGNOSIS — M542 Cervicalgia: Secondary | ICD-10-CM | POA: Diagnosis not present

## 2017-04-14 ENCOUNTER — Encounter: Payer: Self-pay | Admitting: Podiatry

## 2017-04-14 ENCOUNTER — Ambulatory Visit: Payer: Medicare Other | Admitting: Podiatry

## 2017-04-14 DIAGNOSIS — L600 Ingrowing nail: Secondary | ICD-10-CM

## 2017-04-14 DIAGNOSIS — M79672 Pain in left foot: Secondary | ICD-10-CM

## 2017-04-14 DIAGNOSIS — B351 Tinea unguium: Secondary | ICD-10-CM

## 2017-04-14 DIAGNOSIS — M79671 Pain in right foot: Secondary | ICD-10-CM | POA: Diagnosis not present

## 2017-04-14 NOTE — Patient Instructions (Signed)
Seen for hypertrophic nails. All nails debrided. Reviewed proper shoe gear. Return in 3 months or as needed.

## 2017-04-14 NOTE — Progress Notes (Signed)
Subjective: 83 y.o. year old female patient presents complaining of painful nails. Patient requests toe nails trimmed. Both big toe nails been hurting. Been having pain at plantar lateral aspect left foot. Has poor eye sight.  Objective: Dermatologic: Thick yellow deformed nails x 10. Ingrown hallucal nail left great toe without infection. Vascular: Pedal pulses are all palpable. Orthopedic: High arched foot with lateral weight shifting left foot. Neurologic: All epicritic and tactile sensations grossly intact.  Assessment: Dystrophic mycotic nails x 10. Ingrown hallucal nails painful both great toes.  Treatment: All mycotic nails debrided.  Advised to wear lace up tennis shoe to stabilize her foot. Return in 3 months or as needed.

## 2017-04-15 ENCOUNTER — Ambulatory Visit: Payer: Medicare Other

## 2017-04-16 ENCOUNTER — Ambulatory Visit (INDEPENDENT_AMBULATORY_CARE_PROVIDER_SITE_OTHER): Payer: Medicare Other

## 2017-04-16 DIAGNOSIS — M5412 Radiculopathy, cervical region: Secondary | ICD-10-CM | POA: Diagnosis not present

## 2017-04-16 DIAGNOSIS — E538 Deficiency of other specified B group vitamins: Secondary | ICD-10-CM

## 2017-04-16 DIAGNOSIS — R293 Abnormal posture: Secondary | ICD-10-CM | POA: Diagnosis not present

## 2017-04-16 DIAGNOSIS — M542 Cervicalgia: Secondary | ICD-10-CM | POA: Diagnosis not present

## 2017-04-16 DIAGNOSIS — M2560 Stiffness of unspecified joint, not elsewhere classified: Secondary | ICD-10-CM | POA: Diagnosis not present

## 2017-04-16 MED ORDER — CYANOCOBALAMIN 1000 MCG/ML IJ SOLN
1000.0000 ug | Freq: Once | INTRAMUSCULAR | Status: AC
  Administered 2017-04-16: 1000 ug via INTRAMUSCULAR

## 2017-04-27 DIAGNOSIS — M2560 Stiffness of unspecified joint, not elsewhere classified: Secondary | ICD-10-CM | POA: Diagnosis not present

## 2017-04-27 DIAGNOSIS — M542 Cervicalgia: Secondary | ICD-10-CM | POA: Diagnosis not present

## 2017-04-27 DIAGNOSIS — M5412 Radiculopathy, cervical region: Secondary | ICD-10-CM | POA: Diagnosis not present

## 2017-04-27 DIAGNOSIS — R293 Abnormal posture: Secondary | ICD-10-CM | POA: Diagnosis not present

## 2017-04-29 DIAGNOSIS — M5412 Radiculopathy, cervical region: Secondary | ICD-10-CM | POA: Diagnosis not present

## 2017-04-29 DIAGNOSIS — M542 Cervicalgia: Secondary | ICD-10-CM | POA: Diagnosis not present

## 2017-04-29 DIAGNOSIS — M2560 Stiffness of unspecified joint, not elsewhere classified: Secondary | ICD-10-CM | POA: Diagnosis not present

## 2017-04-29 DIAGNOSIS — R293 Abnormal posture: Secondary | ICD-10-CM | POA: Diagnosis not present

## 2017-04-30 DIAGNOSIS — H43813 Vitreous degeneration, bilateral: Secondary | ICD-10-CM | POA: Diagnosis not present

## 2017-04-30 DIAGNOSIS — H353131 Nonexudative age-related macular degeneration, bilateral, early dry stage: Secondary | ICD-10-CM | POA: Diagnosis not present

## 2017-05-14 DIAGNOSIS — M5021 Other cervical disc displacement,  high cervical region: Secondary | ICD-10-CM | POA: Diagnosis not present

## 2017-05-25 DIAGNOSIS — Z8639 Personal history of other endocrine, nutritional and metabolic disease: Secondary | ICD-10-CM | POA: Diagnosis not present

## 2017-05-25 DIAGNOSIS — M810 Age-related osteoporosis without current pathological fracture: Secondary | ICD-10-CM | POA: Diagnosis not present

## 2017-05-25 DIAGNOSIS — R7303 Prediabetes: Secondary | ICD-10-CM | POA: Diagnosis not present

## 2017-05-26 ENCOUNTER — Ambulatory Visit (INDEPENDENT_AMBULATORY_CARE_PROVIDER_SITE_OTHER): Payer: Medicare Other

## 2017-05-26 DIAGNOSIS — E538 Deficiency of other specified B group vitamins: Secondary | ICD-10-CM

## 2017-05-26 MED ORDER — CYANOCOBALAMIN 1000 MCG/ML IJ SOLN
1000.0000 ug | Freq: Once | INTRAMUSCULAR | Status: AC
  Administered 2017-05-26: 1000 ug via INTRAMUSCULAR

## 2017-06-01 ENCOUNTER — Other Ambulatory Visit (HOSPITAL_COMMUNITY): Payer: Self-pay | Admitting: *Deleted

## 2017-06-10 ENCOUNTER — Ambulatory Visit: Payer: Medicare Other | Admitting: Family Medicine

## 2017-06-18 ENCOUNTER — Ambulatory Visit (HOSPITAL_COMMUNITY)
Admission: RE | Admit: 2017-06-18 | Discharge: 2017-06-18 | Disposition: A | Discharge status: Home / Self Care | Payer: Medicare Other | Source: Ambulatory Visit | Attending: Internal Medicine | Admitting: Internal Medicine

## 2017-06-18 ENCOUNTER — Encounter (HOSPITAL_COMMUNITY): Payer: Self-pay

## 2017-06-18 DIAGNOSIS — M810 Age-related osteoporosis without current pathological fracture: Secondary | ICD-10-CM | POA: Diagnosis not present

## 2017-06-18 MED ORDER — ZOLEDRONIC ACID 5 MG/100ML IV SOLN
5.0000 mg | Freq: Once | INTRAVENOUS | Status: AC
  Administered 2017-06-18: 5 mg via INTRAVENOUS
  Filled 2017-06-18: qty 100

## 2017-06-18 MED ORDER — SODIUM CHLORIDE 0.9 % IV SOLN
Freq: Once | INTRAVENOUS | Status: AC
  Administered 2017-06-18: 13:00:00 via INTRAVENOUS

## 2017-06-18 NOTE — Discharge Instructions (Signed)

## 2017-06-22 DIAGNOSIS — Z01419 Encounter for gynecological examination (general) (routine) without abnormal findings: Secondary | ICD-10-CM | POA: Diagnosis not present

## 2017-06-23 ENCOUNTER — Ambulatory Visit (INDEPENDENT_AMBULATORY_CARE_PROVIDER_SITE_OTHER): Payer: Medicare Other | Admitting: Family Medicine

## 2017-06-23 ENCOUNTER — Encounter: Payer: Self-pay | Admitting: Family Medicine

## 2017-06-23 VITALS — BP 138/60 | HR 68 | Temp 98.1°F | Wt 101.1 lb

## 2017-06-23 DIAGNOSIS — M810 Age-related osteoporosis without current pathological fracture: Secondary | ICD-10-CM | POA: Diagnosis not present

## 2017-06-23 DIAGNOSIS — D509 Iron deficiency anemia, unspecified: Secondary | ICD-10-CM | POA: Diagnosis not present

## 2017-06-23 DIAGNOSIS — M4722 Other spondylosis with radiculopathy, cervical region: Secondary | ICD-10-CM | POA: Diagnosis not present

## 2017-06-23 DIAGNOSIS — I2510 Atherosclerotic heart disease of native coronary artery without angina pectoris: Secondary | ICD-10-CM | POA: Diagnosis not present

## 2017-06-23 DIAGNOSIS — Z862 Personal history of diseases of the blood and blood-forming organs and certain disorders involving the immune mechanism: Secondary | ICD-10-CM | POA: Diagnosis not present

## 2017-06-23 DIAGNOSIS — I10 Essential (primary) hypertension: Secondary | ICD-10-CM

## 2017-06-23 MED ORDER — CYANOCOBALAMIN 1000 MCG/ML IJ SOLN
1000.0000 ug | Freq: Once | INTRAMUSCULAR | Status: AC
  Administered 2017-06-23: 1000 ug via INTRAMUSCULAR

## 2017-06-23 NOTE — Progress Notes (Signed)
Subjective:     Patient ID: [name], female   DOB: 02/27/1933, 84 y.o.   MRN: [id number]  HPI Patient seen for medical follow-up. She has history of hypertension, CAD, hypothyroidism, osteoporosis, cervical spondylosis, hyperlipidemia, and B12 deficiency  She is followed in endocrinology and is on Reclast infusions once yearly. She had this recently. She had recent labs done per endocrinology and these were reviewed and stable. She has history of prediabetes and recent fasting blood sugar was 94.  Cervical spondylosis issues in the fall and she is improved with physical therapy. Denies any recent chest pains.  She's had difficulty gaining weight for several years.  Hypertension which has been stable recently. Challenging to control on multiple medications. Compliant with all  Past Medical History:  Diagnosis Date  . Anemia, pernicious   . Arthritis    bursitis   . Atrophic gastritis   . B12 deficiency   . CAD (coronary artery disease)   . Carotid artery stenosis   . Diarrhea    on and off for months  . Family history of adverse reaction to anesthesia    extreme nausea brother and sister  . Fibromyalgia   . Gallbladder polyp   . GERD (gastroesophageal reflux disease)   . Heart murmur   . History of hiatal hernia   . History of kidney stones   . HLD (hyperlipidemia)   . HTN (hypertension)   . Hypothyroidism   . Irritable bowel syndrome (IBS)   . Median arcuate ligament syndrome ([place])   . PVD (peripheral vascular disease) ([place])   . Tubular adenoma of colon 08/2013   with focal high grade dysplasia   Past Surgical History:  Procedure Laterality Date  . ABDOMINAL HYSTERECTOMY    . APPENDECTOMY    . BIOPSY THYROID    . CHOLECYSTECTOMY N/A 10/24/2014   Procedure: MICROSCOPIC  CHOLECYSTECTOMY;  Surgeon: Ralene Ok, MD;  Location: [facility];  Service: General;  Laterality: N/A;  . CORONARY ARTERY BYPASS GRAFT  07  . ESOPHAGEAL MANOMETRY N/A 08/02/2012   Procedure:  ESOPHAGEAL MANOMETRY (EM);  Surgeon: Sable Feil, MD;  Location: [facility];  Service: Endoscopy;  Laterality: N/A;  . THYROID LOBECTOMY      reports that  has never smoked. she has never used smokeless tobacco. She reports that she does not drink alcohol or use drugs. family history includes Breast cancer in her other; Cholelithiasis in her mother; Colon cancer in her brother; Diabetes in her sister; Heart disease in her mother; Lung cancer in her father and sister; Prostate cancer in her brother; Stomach cancer in her cousin. Allergies  Allergen Reactions  . Actos [Pioglitazone] Swelling    Swelling  . Sulfonamide Derivatives Swelling  . Codeine Nausea And Vomiting     Review of Systems  Constitutional: Negative for fatigue and unexpected weight change.  Eyes: Negative for visual disturbance.  Respiratory: Negative for cough, chest tightness, shortness of breath and wheezing.   Cardiovascular: Negative for chest pain, palpitations and leg swelling.  Endocrine: Negative for polydipsia and polyuria.  Genitourinary: Negative for dysuria.  Neurological: Negative for dizziness, seizures, syncope, weakness, light-headedness and headaches.  Psychiatric/Behavioral: Negative for confusion and dysphoric mood.       Objective:   Physical Exam  Constitutional: She appears well-developed and well-nourished.  Eyes: Pupils are equal, round, and reactive to light.  Neck: Neck supple. No JVD present. No thyromegaly present.  Cardiovascular: Normal rate and regular rhythm. Exam reveals no gallop.  Pulmonary/Chest:  Effort normal and breath sounds normal. No respiratory distress. She has no wheezes. She has no rales.  Musculoskeletal: She exhibits no edema.  Neurological: She is alert.       Assessment:     #1 hypertension stable  #2 patient is underweight for height.  #3 history of hypothyroidism followed by endocrinology  #4 osteoporosis  #5 history of CAD  #6 cervical  spondylosis improved following physical therapy  #7 B12 deficiency    Plan:     -Discussed strategies for healthy weight gain -Hopefully she can pickup some with exercise activities now that her neck symptoms are improving -B12 injection was given today -Routine follow-up in 6 months and sooner as needed  [name] MD [facility]

## 2017-06-30 ENCOUNTER — Other Ambulatory Visit: Payer: Self-pay | Admitting: Family Medicine

## 2017-07-02 DIAGNOSIS — H04123 Dry eye syndrome of bilateral lacrimal glands: Secondary | ICD-10-CM | POA: Diagnosis not present

## 2017-07-02 DIAGNOSIS — H353132 Nonexudative age-related macular degeneration, bilateral, intermediate dry stage: Secondary | ICD-10-CM | POA: Diagnosis not present

## 2017-07-02 DIAGNOSIS — H52203 Unspecified astigmatism, bilateral: Secondary | ICD-10-CM | POA: Diagnosis not present

## 2017-07-02 DIAGNOSIS — H43813 Vitreous degeneration, bilateral: Secondary | ICD-10-CM | POA: Diagnosis not present

## 2017-07-03 DIAGNOSIS — Z1231 Encounter for screening mammogram for malignant neoplasm of breast: Secondary | ICD-10-CM | POA: Diagnosis not present

## 2017-07-06 ENCOUNTER — Ambulatory Visit: Payer: Medicare Other | Admitting: Gastroenterology

## 2017-07-06 ENCOUNTER — Encounter: Payer: Self-pay | Admitting: Gastroenterology

## 2017-07-06 VITALS — BP 156/56 | HR 60 | Ht 63.5 in | Wt 100.0 lb

## 2017-07-06 DIAGNOSIS — R1084 Generalized abdominal pain: Secondary | ICD-10-CM | POA: Diagnosis not present

## 2017-07-06 DIAGNOSIS — R195 Other fecal abnormalities: Secondary | ICD-10-CM | POA: Insufficient documentation

## 2017-07-06 DIAGNOSIS — K625 Hemorrhage of anus and rectum: Secondary | ICD-10-CM

## 2017-07-06 MED ORDER — HYOSCYAMINE SULFATE SL 0.125 MG SL SUBL: SUBLINGUAL_TABLET | SUBLINGUAL | 0 refills | Status: DC

## 2017-07-06 NOTE — Patient Instructions (Addendum)
We sent a prescription for Levsin SL, dissolvable tablet, to [facility].  Start Benefiber or Citrucel powder daily.  Call us back in 1 week with an  Update, you can ask for Patty, Jessica's nurse.  [phone], choose option 2 ask for Patty.   If you are age 82 or older, your body mass index should be between 23-30. Your Body mass index is 17.44 kg/m. If this is out of the aforementioned range listed, please consider follow up with your Primary Care Provider.

## 2017-07-06 NOTE — Progress Notes (Signed)
07/06/2017 [name] [id number] 1932-12-19   HISTORY OF PRESENT ILLNESS: This is a pleasant 84 year old female who is known to Dr. Ardis Hughs.  She has lymphocytic colitis, which had been treated and responded well to budesonide in the past.  She also has possibly some component of bile salt induced diarrhea postcholecystectomy and is on Questran powder once daily as maintenance.  She tells me that this weekend on Friday night she had some severe diarrhea.  Has had a lot of gas and bloating as well.  Saturday she was very nauseous and yesterday she felt improved and even more so today other than some mid abdominal discomfort and gas.  She says that when she had the diarrhea she had one episode of rectal bleeding that was more than just a smear so she decided to the evaluated.  Her last colonoscopy was in  September 2017 at which time she was found to have one large polyp that was 14 mm in size that was removed and was a tubular adenoma on pathology.  Random biopsies throughout the colon for complaints of diarrhea confirmed lymphocytic colitis.  She also reports to me that she intermittently has issues with anal leakage.   Past Medical History:  Diagnosis Date  . Anemia, pernicious   . Arthritis    bursitis   . Atrophic gastritis   . B12 deficiency   . CAD (coronary artery disease)   . Carotid artery stenosis   . Diarrhea    on and off for months  . Family history of adverse reaction to anesthesia    extreme nausea brother and sister  . Fibromyalgia   . Gallbladder polyp   . GERD (gastroesophageal reflux disease)   . Heart murmur   . History of hiatal hernia   . History of kidney stones   . HLD (hyperlipidemia)   . HTN (hypertension)   . Hypothyroidism   . Irritable bowel syndrome (IBS)   . Median arcuate ligament syndrome ([place])   . PVD (peripheral vascular disease) ([place])   . Tubular adenoma of colon 08/2013   with focal high grade dysplasia   Past Surgical History:    Procedure Laterality Date  . ABDOMINAL HYSTERECTOMY    . APPENDECTOMY    . BIOPSY THYROID    . CHOLECYSTECTOMY N/A 10/24/2014   Procedure: MICROSCOPIC  CHOLECYSTECTOMY;  Surgeon: Ralene Ok, MD;  Location: [facility];  Service: General;  Laterality: N/A;  . CORONARY ARTERY BYPASS GRAFT  07  . ESOPHAGEAL MANOMETRY N/A 08/02/2012   Procedure: ESOPHAGEAL MANOMETRY (EM);  Surgeon: Sable Feil, MD;  Location: [facility];  Service: Endoscopy;  Laterality: N/A;  . THYROID LOBECTOMY      reports that  has never smoked. she has never used smokeless tobacco. She reports that she does not drink alcohol or use drugs. family history includes Breast cancer in her other; Cholelithiasis in her mother; Colon cancer in her brother; Diabetes in her sister; Heart disease in her mother; Lung cancer in her father and sister; Prostate cancer in her brother; Stomach cancer in her cousin. Allergies  Allergen Reactions  . Actos [Pioglitazone] Swelling    Swelling  . Sulfonamide Derivatives Swelling  . Codeine Nausea And Vomiting      Outpatient Encounter Medications as of 07/06/2017  Medication Sig  . acetaminophen (TYLENOL) 500 MG tablet Take 500 mg by mouth every 6 (six) hours as needed (pain).  Marland Kitchen amLODipine (NORVASC) 10 MG tablet TAKE 1 TABLET BY MOUTH  DAILY  . aspirin EC 81 MG tablet Take 81 mg by mouth at bedtime.  Marland Kitchen atorvastatin (LIPITOR) 80 MG tablet TAKE 1 TABLET BY MOUTH AT BEDTIME  . azelastine (ASTELIN) 0.1 % nasal spray Place 1 spray into both nostrils 2 (two) times daily as needed for rhinitis or allergies. Use in each nostril as directed  . Calcium Carbonate-Vitamin D (CALTRATE 600+D PO) Take 600 mg by mouth daily with lunch.  Marland Kitchen CARAFATE 1 GM/10ML suspension TAKE 10 MLS (1 G TOTAL) BY MOUTH 2 (TWO) TIMES DAILY.  . carvedilol (COREG) 6.25 MG tablet TAKE 1/2 TABLET BY MOUTH 2 TIMES DAILY.  Marland Kitchen Cholecalciferol (VITAMIN D) 2000 UNITS tablet Take 2,000 Units by mouth daily with lunch.  .  cholestyramine (QUESTRAN) 4 GM/DOSE powder Take 1 packet (4 g total) by mouth daily.  . cyanocobalamin (,VITAMIN B-12,) 1000 MCG/ML injection Inject 1,000 mcg into the muscle every 30 (thirty) days. On or about the end of each month  . Cyanocobalamin (B-12) 1000 MCG/ML KIT cyanocobalamin (vit B-12) 1,000 mcg/mL injection kit  Inject 1 mL every month by intramuscular route.  . fluticasone (FLONASE) 50 MCG/ACT nasal spray Place 1 spray daily as needed into both nostrils for allergies or rhinitis.  . hydrALAZINE (APRESOLINE) 50 MG tablet Take 1 tablet (50 mg total) by mouth 3 (three) times daily.  Marland Kitchen levothyroxine (SYNTHROID, LEVOTHROID) 50 MCG tablet Take 50 mcg by mouth daily before breakfast.  . losartan (COZAAR) 100 MG tablet TAKE 1 TABLET BY MOUTH DAILY  . Multiple Vitamins-Minerals (OCUVITE-LUTEIN PO) PreserVision AREDS 2  . Multiple Vitamins-Minerals (PRESERVISION AREDS 2) CAPS Take 1 tablet by mouth daily with lunch.  Marland Kitchen OVER THE COUNTER MEDICATION Place 1 drop into both eyes 2 (two) times daily as needed (dry eyes). Preservative free over the counter eye drop for dry eyes  . POTASSIUM PO potassium  . sodium chloride (OCEAN) 0.65 % nasal spray Place 1 spray into both nostrils 3 (three) times daily as needed for congestion.   Marland Kitchen spironolactone (ALDACTONE) 25 MG tablet TAKE 1 TABLET BY MOUTH ONCE DAILY  . zoledronic acid (RECLAST) 5 MG/100ML SOLN injection Reclast   No facility-administered encounter medications on file as of 07/06/2017.      REVIEW OF SYSTEMS  : All other systems reviewed and negative except where noted in the History of Present Illness.   PHYSICAL EXAM: Ht 5' 3.5" (1.613 m)   Wt 100 lb (45.4 kg)   BMI 17.44 kg/m  General: Well developed white female in no acute distress Head: Normocephalic and atraumatic Eyes:  Sclerae anicteric, conjunctiva pink. Ears: Normal auditory acuity Lungs: Clear throughout to auscultation; no increased WOB Heart: Regular rate and rhythm;  no M/R/G. Abdomen: Soft, non-distended.  BS present.  Mild mid-abdominal TTP. Musculoskeletal: Symmetrical with no gross deformities  Skin: No lesions on visible extremities Extremities: No edema  Neurological: Alert oriented x 4, grossly non-focal Psychological:  Alert and cooperative. Normal mood and affect  ASSESSMENT AND PLAN: *84 year old female with history of lymphocytic colitis and also possibly some bile salt induced diarrhea.  Here today complaining of an acute flare of her diarrhea with one episode of rectal bleeding.  I do not think this represents her lymphocytic colitis.  I think that it is possible that she had some infectious gastroenteritis of some sort.  Symptoms improved and almost resolved except for some mild mid-abdominal cramping and gas.  Also mentions fecal leakage on and off for quite some time.  I am  going to have her add daily Benefiber or Citrucel powder to her bowel regimen to help with bulking the stools and see if that helps with the leakage.  Will give some levsin to use prn as well.  Otherwise will observe for now and call back next week with an update.   CC:  [name], MD

## 2017-07-07 NOTE — Progress Notes (Signed)
I agree with the above note, plan 

## 2017-07-14 ENCOUNTER — Ambulatory Visit: Payer: Medicare Other | Admitting: Podiatry

## 2017-07-15 ENCOUNTER — Other Ambulatory Visit: Payer: Self-pay | Admitting: Gastroenterology

## 2017-07-15 ENCOUNTER — Other Ambulatory Visit: Payer: Self-pay | Admitting: Family Medicine

## 2017-07-21 ENCOUNTER — Other Ambulatory Visit: Payer: Self-pay | Admitting: Family Medicine

## 2017-07-21 ENCOUNTER — Ambulatory Visit (INDEPENDENT_AMBULATORY_CARE_PROVIDER_SITE_OTHER): Payer: Medicare Other | Admitting: Family Medicine

## 2017-07-21 DIAGNOSIS — E538 Deficiency of other specified B group vitamins: Secondary | ICD-10-CM

## 2017-07-21 MED ORDER — CYANOCOBALAMIN 1000 MCG/ML IJ SOLN
1000.0000 ug | Freq: Once | INTRAMUSCULAR | Status: AC
  Administered 2017-07-21: 1000 ug via INTRAMUSCULAR

## 2017-07-21 NOTE — Progress Notes (Signed)
Per orders of Dr. Burchette, injection of Vitamin B 12 given by Alexiss Iturralde ANN. Patient tolerated injection well. 

## 2017-07-23 ENCOUNTER — Ambulatory Visit: Payer: Medicare Other | Admitting: Podiatry

## 2017-07-23 ENCOUNTER — Encounter: Payer: Self-pay | Admitting: Podiatry

## 2017-07-23 DIAGNOSIS — B351 Tinea unguium: Secondary | ICD-10-CM | POA: Diagnosis not present

## 2017-07-23 DIAGNOSIS — M79672 Pain in left foot: Secondary | ICD-10-CM | POA: Diagnosis not present

## 2017-07-23 DIAGNOSIS — L600 Ingrowing nail: Secondary | ICD-10-CM | POA: Diagnosis not present

## 2017-07-23 DIAGNOSIS — M79671 Pain in right foot: Secondary | ICD-10-CM

## 2017-07-23 NOTE — Progress Notes (Signed)
Subjective: 84 y.o. year old female patient presents complaining of painful nails. Patient requests toe nails trimmed.   Objective: Dermatologic: Thick yellow deformed nails x 10. Ingrown hallucal nails on both great toes with pain. Vascular: Pedal pulses are all palpable. Orthopedic: No gross deformities. Neurologic: All epicritic and tactile sensations grossly intact.  Assessment: Dystrophic mycotic nails x 10. Ingrown hallucal nails. Pain in both feet.  Treatment: All mycotic nails debrided.  Left great toe nail border cleansed with Iodine and compression bandage applied. Return in 3 months or as needed.

## 2017-07-23 NOTE — Patient Instructions (Signed)
Seen for hypertrophic nails. All nails debrided. Return in 3 months or as needed.  

## 2017-07-29 DIAGNOSIS — R062 Wheezing: Secondary | ICD-10-CM | POA: Diagnosis not present

## 2017-07-29 DIAGNOSIS — L299 Pruritus, unspecified: Secondary | ICD-10-CM | POA: Diagnosis not present

## 2017-07-29 DIAGNOSIS — K219 Gastro-esophageal reflux disease without esophagitis: Secondary | ICD-10-CM | POA: Diagnosis not present

## 2017-07-29 DIAGNOSIS — J301 Allergic rhinitis due to pollen: Secondary | ICD-10-CM | POA: Diagnosis not present

## 2017-08-17 ENCOUNTER — Other Ambulatory Visit: Payer: Self-pay | Admitting: Family Medicine

## 2017-09-03 ENCOUNTER — Ambulatory Visit (INDEPENDENT_AMBULATORY_CARE_PROVIDER_SITE_OTHER): Payer: Medicare Other | Admitting: Family Medicine

## 2017-09-03 ENCOUNTER — Ambulatory Visit: Payer: Medicare Other

## 2017-09-03 DIAGNOSIS — E538 Deficiency of other specified B group vitamins: Secondary | ICD-10-CM | POA: Diagnosis not present

## 2017-09-03 MED ORDER — CYANOCOBALAMIN 1000 MCG/ML IJ SOLN
1000.0000 ug | Freq: Once | INTRAMUSCULAR | Status: AC
  Administered 2017-09-03: 1000 ug via INTRAMUSCULAR

## 2017-09-03 NOTE — Progress Notes (Signed)
Per orders of Dr. Banks, injection of Vitamin B 12 given by NIMMONS, SYLVIA ANN. Patient tolerated injection well. 

## 2017-10-06 ENCOUNTER — Ambulatory Visit: Payer: Medicare Other | Admitting: Family Medicine

## 2017-10-07 ENCOUNTER — Encounter: Payer: Self-pay | Admitting: Family Medicine

## 2017-10-07 ENCOUNTER — Ambulatory Visit: Payer: Medicare Other | Admitting: Family Medicine

## 2017-10-07 VITALS — BP 162/68 | HR 61 | Temp 98.0°F | Wt 101.7 lb

## 2017-10-07 DIAGNOSIS — G8929 Other chronic pain: Secondary | ICD-10-CM | POA: Diagnosis not present

## 2017-10-07 DIAGNOSIS — M6530 Trigger finger, unspecified finger: Secondary | ICD-10-CM | POA: Diagnosis not present

## 2017-10-07 DIAGNOSIS — E538 Deficiency of other specified B group vitamins: Secondary | ICD-10-CM | POA: Diagnosis not present

## 2017-10-07 DIAGNOSIS — M545 Low back pain: Secondary | ICD-10-CM | POA: Diagnosis not present

## 2017-10-07 MED ORDER — CYANOCOBALAMIN 1000 MCG/ML IJ SOLN
1000.0000 ug | Freq: Once | INTRAMUSCULAR | Status: AC
  Administered 2017-10-07: 1000 ug via INTRAMUSCULAR

## 2017-10-07 NOTE — Progress Notes (Signed)
Subjective:     Patient ID: [name], female   DOB: 05/04/1932, 84 y.o.   MRN: [id number]  HPI  Patient seen for the following issues  Progressive low back pain for several months. Pain is lower lumbar region somewhat bilateral. She feels she is having increased problems with ambulation. She especially has had some problems with weakness and numbness left lower extremity. Her numbness mostly involves left lateral foot. She has some weakness with dorsiflexion at times after periods of sitting. Pain is worse with ambulation. No loss of urine or stool control. She has been using some Tylenol with mild to moderate relief  Denies any recent back injury. No fevers or chills. No appetite or weight changes. Increased fear of falling.  Other issue is she states has had several fingers recently that get "locked ". She is not sure specifically which fingers but apparently had different fingers on different hands. She sometimes has to manually open her fingers. She cannot reproduce this on exam at this time  Past Medical History:  Diagnosis Date  . Anemia, pernicious   . Arthritis    bursitis   . Atrophic gastritis   . B12 deficiency   . CAD (coronary artery disease)   . Carotid artery stenosis   . Diarrhea    on and off for months  . Family history of adverse reaction to anesthesia    extreme nausea brother and sister  . Fibromyalgia   . Gallbladder polyp   . GERD (gastroesophageal reflux disease)   . Heart murmur   . History of hiatal hernia   . History of kidney stones   . HLD (hyperlipidemia)   . HTN (hypertension)   . Hypothyroidism   . Irritable bowel syndrome (IBS)   . Median arcuate ligament syndrome ([place])   . PVD (peripheral vascular disease) ([place])   . Tubular adenoma of colon 08/2013   with focal high grade dysplasia   Past Surgical History:  Procedure Laterality Date  . ABDOMINAL HYSTERECTOMY    . APPENDECTOMY    . BIOPSY THYROID    . CHOLECYSTECTOMY N/A 10/24/2014    Procedure: MICROSCOPIC  CHOLECYSTECTOMY;  Surgeon: Ralene Ok, MD;  Location: [facility];  Service: General;  Laterality: N/A;  . CORONARY ARTERY BYPASS GRAFT  07  . ESOPHAGEAL MANOMETRY N/A 08/02/2012   Procedure: ESOPHAGEAL MANOMETRY (EM);  Surgeon: Sable Feil, MD;  Location: [facility];  Service: Endoscopy;  Laterality: N/A;  . THYROID LOBECTOMY      reports that she has never smoked. She has never used smokeless tobacco. She reports that she does not drink alcohol or use drugs. family history includes Breast cancer in her other; Cholelithiasis in her mother; Colon cancer in her brother; Diabetes in her sister; Heart disease in her mother; Lung cancer in her father and sister; Prostate cancer in her brother; Stomach cancer in her cousin. Allergies  Allergen Reactions  . Actos [Pioglitazone] Swelling    Swelling  . Sulfonamide Derivatives Swelling  . Codeine Nausea And Vomiting    Review of Systems  Constitutional: Negative for appetite change, chills, fever and unexpected weight change.  Respiratory: Negative for shortness of breath.   Cardiovascular: Negative for chest pain.  Genitourinary: Negative for dysuria.  Musculoskeletal: Positive for back pain.  Neurological: Positive for weakness and numbness.  Hematological: Negative for adenopathy.       Objective:   Physical Exam  Constitutional: She appears well-developed and well-nourished.  Cardiovascular: Normal rate and regular rhythm.  Pulmonary/Chest: Effort normal and breath sounds normal.  Musculoskeletal: She exhibits no edema.  She has some mild tenderness lower lumbar region bilaterally. No spinal tenderness. Straight leg raise are negative.  Neurological:  Trace reflex ankle bilaterally and 1+ knee bilaterally. She has weakness with dorsiflexion on left compared to the right. She has some mild weakness with leg extension bilaterally.       Assessment:     #1 progressive bilateral lumbar back pain.  Suspect lumbar stenosis. Appears to be developing some weakness with left foot dorsi flexion- but no true foot drop.   #2 probable trigger finger-involving more than one finger    Plan:     -Discussed pathophysiology of trigger finger. At this point she will try to pay attention to which fingers are involved and come back for follow-up if this is becoming more progressive problem  -Plan to get x-rays of lumbosacral spine neck week to further assess.  May eventually need MRI to further assess.    [name] MD [facility]

## 2017-10-07 NOTE — Patient Instructions (Signed)
Spinal Stenosis Spinal stenosis occurs when the open space (spinal canal) between the bones of your spine (vertebrae) narrows, putting pressure on the spinal cord or nerves. What are the causes? This condition is caused by areas of bone pushing into the central canals of your vertebrae. This condition may be present at birth (congenital), or it may be caused by:  Arthritic deterioration of your vertebrae (spinal degeneration). This usually starts around age 25.  Injury or trauma to the spine.  Tumors in the spine.  Calcium deposits in the spine.  What are the signs or symptoms? Symptoms of this condition include:  Pain in the neck or back that is generally worse with activities, particularly when standing and walking.  Numbness, tingling, hot or cold sensations, weakness, or weariness in your legs.  Pain going up and down the leg (sciatica).  Frequent episodes of falling.  A foot-slapping gait that leads to muscle weakness.  In more serious cases, you may develop:  Problemspassing stool or passing urine.  Difficulty having sex.  Loss of feeling in part or all of your leg.  Symptoms may come on slowly and get worse over time. How is this diagnosed? This condition is diagnosed based on your medical history and a physical exam. Tests will also be done, such as:  MRI.  CT scan.  X-ray.  How is this treated? Treatment for this condition often focuses on managing your pain and any other symptoms. Treatment may include:  Practicing good posture to lessen pressure on your nerves.  Exercising to strengthen muscles, build endurance, improve balance, and maintain good joint movement (range of motion).  Losing weight, if needed.  Taking medicines to reduce swelling, inflammation, or pain.  Assistive devices, such as a corset or brace.  In some cases, surgery may be needed. The most common procedure is decompression laminectomy. This is done to remove excess bone that  puts pressure on your nerve roots. Follow these instructions at home: Managing pain, stiffness, and swelling  Do all exercises and stretches as told by your health care provider.  Practice good posture. If you were given a brace or a corset, wear it as told by your health care provider.  Do not do any activities that cause pain. Ask your health care provider what activities are safe for you.  Do not lift anything that is heavier than 10 lb (4.5 kg) or the limit that your health care provider tells you.  Maintain a healthy weight. Talk with your health care provider if you need help losing weight.  If directed, apply heat to the affected area as often as told by your health care provider. Use the heat source that your health care provider recommends, such as a moist heat pack or a heating pad. ? Place a towel between your skin and the heat source. ? Leave the heat on for 20-30 minutes. ? Remove the heat if your skin turns bright red. This is especially important if you are not able to feel pain, heat, or cold. You may have a greater risk of getting burned. General instructions  Take over-the-counter and prescription medicines only as told by your health care provider.  Do not use any products that contain nicotine or tobacco, such as cigarettes and e-cigarettes. If you need help quitting, ask your health care provider.  Eat a healthy diet. This includes plenty of fruits and vegetables, whole grains, and low-fat (lean) protein.  Keep all follow-up visits as told by your health care provider.  This is important. Contact a health care provider if:  Your symptoms do not get better or they get worse.  You have a fever. Get help right away if:  You have new or worse pain in your neck or upper back.  You have severe pain that cannot be controlled with medicines.  You are dizzy.  You have vision problems, blurred vision, or double vision.  You have a severe headache that is worse when  you stand.  You have nausea or you vomit.  You develop new or worse numbness or tingling in your back or legs.  You have pain, redness, swelling, or warmth in your arm or leg. Summary  Spinal stenosis occurs when the open space (spinal canal) between the bones of your spine (vertebrae) narrows. This narrowing puts pressure on the spinal cord or nerves.  Spinal stenosis can cause numbness, weakness, or pain in the neck, back, and legs.  This condition may be caused by a birth defect, arthritic deterioration of your vertebrae, injury, tumors, or calcium deposits.  This condition is usually diagnosed with MRIs, CT scans, and X-rays. This information is not intended to replace advice given to you by your health care provider. Make sure you discuss any questions you have with your health care provider. Document Released: 07/05/2003 Document Revised: 03/19/2016 Document Reviewed: 03/19/2016 Elsevier Interactive Patient Education  2018 [employer]  

## 2017-10-19 ENCOUNTER — Other Ambulatory Visit: Payer: Self-pay | Admitting: Family Medicine

## 2017-10-19 ENCOUNTER — Telehealth: Payer: Self-pay | Admitting: Family Medicine

## 2017-10-19 NOTE — Telephone Encounter (Signed)
Copied from [place] [id number]. Topic: Quick Communication - See Telephone Encounter >> Oct 19, 2017 12:35 PM Rutherford Nail, NT wrote: CRM for notification. See Telephone encounter for: 10/19/17. Patient calling and states that she has not received a call to schedule her x-ray. No orders seen in chart. Please advise.  CB#: [phone] Can leave a message on machine if there no answer

## 2017-10-19 NOTE — Addendum Note (Signed)
Addended by: [name] on: 10/19/2017 05:12 PM   Modules accepted: Orders

## 2017-10-19 NOTE — Telephone Encounter (Signed)
We thought we were going to have our X-ray up and going by now.  Let her know I am putting in order for x-ray of L-S spine to get at [facility] 8:30 A to 5 PM M-F.

## 2017-10-19 NOTE — Telephone Encounter (Signed)
No order in Epic   Plan: 10/07/17     -Discussed pathophysiology of trigger finger. At this point she will try to pay attention to which fingers are involved and come back for follow-up if this is becoming more progressive problem  -Plan to get x-rays of lumbosacral spine neck week to further assess.  May eventually need MRI to further assess.    [name] MD [facility]

## 2017-10-20 DIAGNOSIS — J301 Allergic rhinitis due to pollen: Secondary | ICD-10-CM | POA: Diagnosis not present

## 2017-10-20 DIAGNOSIS — K219 Gastro-esophageal reflux disease without esophagitis: Secondary | ICD-10-CM | POA: Diagnosis not present

## 2017-10-20 DIAGNOSIS — R062 Wheezing: Secondary | ICD-10-CM | POA: Diagnosis not present

## 2017-10-20 DIAGNOSIS — J019 Acute sinusitis, unspecified: Secondary | ICD-10-CM | POA: Diagnosis not present

## 2017-10-20 NOTE — Telephone Encounter (Signed)
attempted to call, unable to leave a voice mail. Will try again.

## 2017-10-23 ENCOUNTER — Ambulatory Visit (INDEPENDENT_AMBULATORY_CARE_PROVIDER_SITE_OTHER)
Admission: RE | Admit: 2017-10-23 | Discharge: 2017-10-23 | Disposition: A | Discharge status: Home / Self Care | Payer: Medicare Other | Source: Ambulatory Visit | Attending: Family Medicine | Admitting: Family Medicine

## 2017-10-23 DIAGNOSIS — M545 Low back pain: Secondary | ICD-10-CM | POA: Diagnosis not present

## 2017-10-23 DIAGNOSIS — M47816 Spondylosis without myelopathy or radiculopathy, lumbar region: Secondary | ICD-10-CM | POA: Diagnosis not present

## 2017-10-23 DIAGNOSIS — G8929 Other chronic pain: Secondary | ICD-10-CM | POA: Diagnosis not present

## 2017-10-23 NOTE — Telephone Encounter (Signed)
Pt is aware and she will go to [facility] today

## 2017-10-26 ENCOUNTER — Other Ambulatory Visit: Payer: Self-pay | Admitting: Family Medicine

## 2017-10-26 DIAGNOSIS — M545 Low back pain: Principal | ICD-10-CM

## 2017-10-26 DIAGNOSIS — G8929 Other chronic pain: Secondary | ICD-10-CM

## 2017-11-03 ENCOUNTER — Ambulatory Visit: Payer: Medicare Other | Admitting: Podiatry

## 2017-11-03 ENCOUNTER — Encounter: Payer: Self-pay | Admitting: Podiatry

## 2017-11-03 DIAGNOSIS — B351 Tinea unguium: Secondary | ICD-10-CM | POA: Diagnosis not present

## 2017-11-03 DIAGNOSIS — M79671 Pain in right foot: Secondary | ICD-10-CM

## 2017-11-03 DIAGNOSIS — L600 Ingrowing nail: Secondary | ICD-10-CM | POA: Diagnosis not present

## 2017-11-03 DIAGNOSIS — M79672 Pain in left foot: Secondary | ICD-10-CM

## 2017-11-03 NOTE — Patient Instructions (Signed)
Seen for hypertrophic nails. All nails debrided. Return in 3 months or as needed.  

## 2017-11-03 NOTE — Progress Notes (Signed)
Subjective: 84 y.o. year old female patient presents complaining of painful nails. Patient requests toe nails trimmed. Denies any new problems.  Objective: Dermatologic: Thick yellow deformed nails x 10. Ingrown hallucal nails without infection. Vascular: Pedal pulses are all palpable. Orthopedic: Contracted lesser digits bilateral. Neurologic: All epicritic and tactile sensations grossly intact.  Assessment: Dystrophic mycotic nails x 10. Ingrown nails both great toes. Painful great toe nails.  Treatment: All mycotic nails debrided.  Return in 3 months or as needed.

## 2017-11-09 ENCOUNTER — Ambulatory Visit (INDEPENDENT_AMBULATORY_CARE_PROVIDER_SITE_OTHER): Payer: Medicare Other | Admitting: *Deleted

## 2017-11-09 DIAGNOSIS — E538 Deficiency of other specified B group vitamins: Secondary | ICD-10-CM | POA: Diagnosis not present

## 2017-11-09 MED ORDER — CYANOCOBALAMIN 1000 MCG/ML IJ SOLN
1000.0000 ug | Freq: Once | INTRAMUSCULAR | Status: AC
  Administered 2017-11-09: 1000 ug via INTRAMUSCULAR

## 2017-11-09 NOTE — Progress Notes (Signed)
Per orders of Dr. Elease Hashimoto, injection of B12 given by Dorrene German. Patient tolerated injection well.

## 2017-11-23 DIAGNOSIS — R7309 Other abnormal glucose: Secondary | ICD-10-CM | POA: Diagnosis not present

## 2017-11-23 DIAGNOSIS — M810 Age-related osteoporosis without current pathological fracture: Secondary | ICD-10-CM | POA: Diagnosis not present

## 2017-11-23 DIAGNOSIS — E041 Nontoxic single thyroid nodule: Secondary | ICD-10-CM | POA: Diagnosis not present

## 2017-11-25 DIAGNOSIS — M5416 Radiculopathy, lumbar region: Secondary | ICD-10-CM | POA: Diagnosis not present

## 2017-12-08 DIAGNOSIS — M5416 Radiculopathy, lumbar region: Secondary | ICD-10-CM | POA: Diagnosis not present

## 2017-12-08 DIAGNOSIS — M545 Low back pain: Secondary | ICD-10-CM | POA: Diagnosis not present

## 2017-12-10 ENCOUNTER — Ambulatory Visit: Payer: Medicare Other

## 2017-12-11 ENCOUNTER — Ambulatory Visit: Payer: Medicare Other

## 2017-12-12 NOTE — Progress Notes (Addendum)
Subjective:   [name] is a 84 y.o. female who presents for Medicare Annual (Subsequent) preventive examination.  Reports health fair due to MRI and back pain  Had hx CABG  Mammogram 12/2015 due 12/2016 -  Came to see Dr. Elease Hashimoto about 3 weeks ago. MRI and to see neurosurgeon on Wed.  Had to travel yesterday and this am not walking well  Riding and standing makes it worse  Feels like her pain has gotten worse   dexa scan 04/2016 ( taking reclast q year) still take reclast Had it in January; Dr. Buddy Duty followed    Diet  BMI 174 Spoke with Dr. Ardis Hughs nurse Is doing a food diary -did really well Eats lots of vegetables and fruits; (Gallbladder out)  Can't eat dark greens Cut back on fruit  Eat more bland foods; Eats eggs; eat cheese, yogurt, peanut butter Use boost ( now 2 per day)  Limits the fruit   Was on prevalite but now no longer has to take it    BMI 17   Exercise She does housework Shopping and the care of the home when not in pain Loves to garden    Health Maintenance Due  Topic Date Due  . INFLUENZA VACCINE  11/26/2017   Educated regarding the shingrix  Teola Bradley is sending to GYN instead of Dr.  Elease Hashimoto Not due this year until Nov or dec  GYN Dr. Stann Mainland at [facility] pelvic exam as well   Was told that her chances of ovarian cancer increase with age  Brother had colon cancer now     Cardiac Risk Factors include: advanced age (>35mn, >>22women);family history of premature cardiovascular disease;hypertension      Objective:     Vitals: BP 120/60   Pulse 63   Ht 5' 3.5" (1.613 m)   Wt 99 lb 6 oz (45.1 kg)   SpO2 98%   BMI 17.33 kg/m   Body mass index is 17.33 kg/m.  Advanced Directives 12/14/2017 12/09/2016 08/19/2016 10/16/2014  Does Patient Have a Medical Advance Directive? No No No No  Would patient like information on creating a medical advance directive? - - No - Patient declined Yes - Educational materials given    Spouse will make decisions unless something should happen to both of them, her sister would make decisions.  Tobacco Social History   Tobacco Use  Smoking Status Never Smoker  Smokeless Tobacco Never Used     Counseling given: Yes   Clinical Intake:    Past Medical History:  Diagnosis Date  . Anemia, pernicious   . Arthritis    bursitis   . Atrophic gastritis   . B12 deficiency   . CAD (coronary artery disease)   . Carotid artery stenosis   . Diarrhea    on and off for months  . Family history of adverse reaction to anesthesia    extreme nausea brother and sister  . Fibromyalgia   . Gallbladder polyp   . GERD (gastroesophageal reflux disease)   . Heart murmur   . History of hiatal hernia   . History of kidney stones   . HLD (hyperlipidemia)   . HTN (hypertension)   . Hypothyroidism   . Irritable bowel syndrome (IBS)   . Median arcuate ligament syndrome ([place]   . PVD (peripheral vascular disease) ([place]   . Tubular adenoma of colon 08/2013   with focal high grade dysplasia   Past Surgical History:  Procedure Laterality Date  .  ABDOMINAL HYSTERECTOMY    . APPENDECTOMY    . BIOPSY THYROID    . CHOLECYSTECTOMY N/A 10/24/2014   Procedure: MICROSCOPIC  CHOLECYSTECTOMY;  Surgeon: Ralene Ok, MD;  Location: [facility];  Service: General;  Laterality: N/A;  . CORONARY ARTERY BYPASS GRAFT  07  . ESOPHAGEAL MANOMETRY N/A 08/02/2012   Procedure: ESOPHAGEAL MANOMETRY (EM);  Surgeon: Sable Feil, MD;  Location: [facility];  Service: Endoscopy;  Laterality: N/A;  . THYROID LOBECTOMY     Family History  Problem Relation Age of Onset  . Heart disease Mother   . Cholelithiasis Mother   . Prostate cancer Brother   . Colon cancer Brother   . Breast cancer Other        niece - breast cancer  . Diabetes Sister   . Lung cancer Father   . Lung cancer Sister   . Stomach cancer Cousin   . Esophageal cancer Neg Hx    Social History   Socioeconomic History  .  Marital status: Married    Spouse name: Not on file  . Number of children: Not on file  . Years of education: 91  . Highest education level: Not on file  Occupational History  . Occupation: retired    Fish farm manager: RETIRED  Social Needs  . Financial resource strain: Not on file  . Food insecurity:    Worry: Not on file    Inability: Not on file  . Transportation needs:    Medical: Not on file    Non-medical: Not on file  Tobacco Use  . Smoking status: Never Smoker  . Smokeless tobacco: Never Used  Substance and Sexual Activity  . Alcohol use: No    Alcohol/week: 0.0 standard drinks  . Drug use: No  . Sexual activity: Not on file  Lifestyle  . Physical activity:    Days per week: Not on file    Minutes per session: Not on file  . Stress: Not on file  Relationships  . Social connections:    Talks on phone: Not on file    Gets together: Not on file    Attends religious service: Not on file    Active member of club or organization: Not on file    Attends meetings of clubs or organizations: Not on file    Relationship status: Not on file  Other Topics Concern  . Not on file  Social History Narrative   [name], Married. Lives with husband. Had been a very active woman.    Outpatient Encounter Medications as of 12/14/2017  Medication Sig  . acetaminophen (TYLENOL) 500 MG tablet Take 500 mg by mouth every 6 (six) hours as needed (pain).  Marland Kitchen amLODipine (NORVASC) 10 MG tablet TAKE 1 TABLET BY MOUTH ONCE DAILY  . aspirin EC 81 MG tablet Take 81 mg by mouth at bedtime.  Marland Kitchen atorvastatin (LIPITOR) 80 MG tablet TAKE 1 TABLET BY MOUTH AT BEDTIME  . azelastine (ASTELIN) 0.1 % nasal spray Place 1 spray into both nostrils 2 (two) times daily as needed for rhinitis or allergies. Use in each nostril as directed  . Calcium Carbonate-Vitamin D (CALTRATE 600+D PO) Take 600 mg by mouth daily with lunch.  Marland Kitchen CARAFATE 1 GM/10ML suspension TAKE 10 MLS (1 G TOTAL) BY MOUTH 2 (TWO) TIMES DAILY.  .  carvedilol (COREG) 6.25 MG tablet TAKE 1/2 TABLET BY MOUTH TWICE DAILY  . Cholecalciferol (VITAMIN D) 2000 UNITS tablet Take 2,000 Units by mouth daily with lunch.  . cyanocobalamin (,  VITAMIN B-12,) 1000 MCG/ML injection Inject 1,000 mcg into the muscle every 30 (thirty) days. On or about the end of each month  . Cyanocobalamin (B-12) 1000 MCG/ML KIT cyanocobalamin (vit B-12) 1,000 mcg/mL injection kit  Inject 1 mL every month by intramuscular route.  . fluticasone (FLONASE) 50 MCG/ACT nasal spray Place 1 spray daily as needed into both nostrils for allergies or rhinitis.  . hydrALAZINE (APRESOLINE) 50 MG tablet TAKE 1 TABLET BY MOUTH 3 TIMES DAILY  . Hyoscyamine Sulfate SL (LEVSIN/SL) 0.125 MG SUBL Dissolve 1 tablet on the tongue ,every 8 hours as needed.  Marland Kitchen levothyroxine (SYNTHROID, LEVOTHROID) 50 MCG tablet Take 50 mcg by mouth daily before breakfast.  . losartan (COZAAR) 100 MG tablet TAKE ONE (1) TABLET BY MOUTH EVERY DAY  . Multiple Vitamins-Minerals (OCUVITE-LUTEIN PO) PreserVision AREDS 2  . Multiple Vitamins-Minerals (PRESERVISION AREDS 2) CAPS Take 1 tablet by mouth daily with lunch.  Marland Kitchen OVER THE COUNTER MEDICATION Place 1 drop into both eyes 2 (two) times daily as needed (dry eyes). Preservative free over the counter eye drop for dry eyes  . POTASSIUM PO potassium  . sodium chloride (OCEAN) 0.65 % nasal spray Place 1 spray into both nostrils 3 (three) times daily as needed for congestion.   Marland Kitchen spironolactone (ALDACTONE) 25 MG tablet TAKE 1 TABLET BY MOUTH ONCE DAILY  . zoledronic acid (RECLAST) 5 MG/100ML SOLN injection Reclast  . PREVALITE 4 g packet TAKE 1 PACKET AS DIRECTED DAILY (Patient not taking: Reported on 12/14/2017)  . [EXPIRED] cyanocobalamin ((VITAMIN B-12)) injection 1,000 mcg    No facility-administered encounter medications on file as of 12/14/2017.     Activities of Daily Living In your present state of health, do you have any difficulty performing the following  activities: 12/14/2017 06/18/2017  Hearing? N N  Vision? N N  Difficulty concentrating or making decisions? N N  Walking or climbing stairs? Y N  Dressing or bathing? N N  Doing errands, shopping? N -  Preparing Food and eating ? N -  Using the Toilet? N -  In the past six months, have you accidently leaked urine? Y -  Do you have problems with loss of bowel control? N -  Managing your Medications? N -  Managing your Finances? N -  Housekeeping or managing your Housekeeping? N -  Some recent data might be hidden    Patient Care Team: [name], MD as PCP - General (Family Medicine) Sherren Mocha, MD (Cardiology) Unice Bailey, MD (Internal Medicine) Delrae Rend, MD (Endocrinology) Shon Hough, MD (Ophthalmology) Barbaraann Rondo, MD (Obstetrics and Gynecology) Rolm Bookbinder, MD (Dermatology)    GYN dr. Vania Rea 06/22/2017  Assessment:   This is a routine wellness examination for Kea.  Exercise Activities and Dietary recommendations Current Exercise Habits: Home exercise routine(exercise limited at present), Exercise limited by: orthopedic condition(s)  Goals    . Attending meditation / other stress management classes     Would do "relaxation"  Would love to do tai chi again  "A Matter of Balance" Class  Wednesdays - April 2 to May 21  Date: 08/11/2012 3:00 PM - 4:30 PM  Cost: Free  Location: [facility] [place], [place]  Add to my Calendar   This is an evidence-based program designed to reduce the fear of falling and improve activity levels among older adults. Classes include group discussions, mutual problem solving, and exercises to improve strength, coordination and balance. Registration is required,  as space is limited. Attendance at all sessions is necessary for successful completion of the class. Please call [phone] to register. Funding for the class is made possible by a grant from the  [employer]. Contributions are accepted. The last day to join the class is Wednesday, April 16.    [place] and recreation; Tai Chi  Can go to [facility] if needed   ;    . Exercise 150 minutes per week (moderate activity)     Is considering aerobics     . Patient Stated     Continue to stay healthy Did a fall prevention calls;  Pain management class  Will hold exercise for now       Fall Risk Fall Risk  12/14/2017 12/09/2016 05/21/2015 11/16/2014 08/25/2013  Falls in the past year? Yes No Yes No No  Comment tumbled from the shower  - - - -  Number falls in past yr: 1 - 1 - -  Injury with Fall? No - - - -  Risk for fall due to : Impaired mobility - Other (Comment) - -  Risk for fall due to: Comment secondary to pain - - - -  Follow up Education provided - - - -  Comment states she could have avoided; she put her foot in the seat in teh shower  - - - -     Depression Screen PHQ 2/9 Scores 12/14/2017 12/09/2016 05/21/2015 11/16/2014  PHQ - 2 Score 1 0 0 0    Some mild depression but feels a little overwhelmed with back issues and recent deaths. Can't do what she wants to do  Once she knows what is work with her back, she will feel better  Always been so active   Spouse still works 1/2 days with his brother   Cognitive Function MMSE - [place] Exam 12/14/2017 12/09/2016  Not completed: (No Data) (No Data)        Immunization History  Administered Date(s) Administered  . Influenza Whole 02/18/2008, 02/26/2010  . Influenza, High Dose Seasonal PF 02/10/2017  . Influenza,inj,Quad PF,6+ Mos 02/01/2014  . Influenza-Unspecified 01/27/2012, 01/27/2015, 02/22/2016  . Pneumococcal Conjugate-13 08/25/2013  . Pneumococcal Polysaccharide-23 06/27/2004  . Zoster 01/27/2015     Screening Tests Health Maintenance  Topic Date Due  . INFLUENZA VACCINE  11/26/2017  . TETANUS/TDAP  12/15/2018 (Originally 10/29/1951)  . MAMMOGRAM   02/26/2018  . DEXA SCAN  Completed  . PNA vac Low Risk Adult  Completed         Plan:      PCP Notes   Health Maintenance Educated regarding the shingrix   Teola Bradley is sending to GYN instead of Dr.  Elease Hashimoto Not due this year until Nov or dec  Has mammograms annually   GYN Dr. Stann Mainland at [place] pelvic exam as well   Was told that her chances of ovarian cancer increase with age  Brother had colon cancer now  Abnormal Screens  none  Referrals  none  Patient concerns; In process of being evaluated for back pain by neurosurgeon on Wed.   Nurse Concerns; As noted; the patient is delightful w a natural resilience.    Next PCP apt TBS; just seen early July with MRI ordered and referral made         I have personally reviewed and noted the following in the patient's chart:   . Medical and social history . Use of  alcohol, tobacco or illicit drugs  . Current medications and supplements . Functional ability and status . Nutritional status . Physical activity . Advanced directives . List of other physicians . Hospitalizations, surgeries, and ER visits in previous 12 months . Vitals . Screenings to include cognitive, depression, and falls . Referrals and appointments  In addition, I have reviewed and discussed with patient certain preventive protocols, quality metrics, and best practice recommendations. A written personalized care plan for preventive services as well as general preventive health recommendations were provided to patient.     VTWYS,ORTQS, RN  12/14/2017  I have reviewed the documentation for the AWV and [place] provided by the health coach and agree with their documentation. I was immediately available for any questions  [name] MD [facility]

## 2017-12-14 ENCOUNTER — Ambulatory Visit (INDEPENDENT_AMBULATORY_CARE_PROVIDER_SITE_OTHER): Payer: Medicare Other

## 2017-12-14 ENCOUNTER — Ambulatory Visit: Payer: Medicare Other

## 2017-12-14 VITALS — BP 120/60 | HR 63 | Ht 63.5 in | Wt 99.4 lb

## 2017-12-14 DIAGNOSIS — Z0000 Encounter for general adult medical examination without abnormal findings: Secondary | ICD-10-CM

## 2017-12-14 DIAGNOSIS — E538 Deficiency of other specified B group vitamins: Secondary | ICD-10-CM

## 2017-12-14 MED ORDER — CYANOCOBALAMIN 1000 MCG/ML IJ SOLN
1000.0000 ug | Freq: Once | INTRAMUSCULAR | Status: AC
Start: 2017-12-14 — End: 2017-12-14
  Administered 2017-12-14: 1000 ug via INTRAMUSCULAR

## 2017-12-14 NOTE — Patient Instructions (Addendum)
[name] , Thank you for taking time to come for your Medicare Wellness Visit. I appreciate your ongoing commitment to your health goals. Please review the following plan we discussed and let me know if I can assist you in the future.   Will take tetanus if needed A Tetanus is recommended every 10 years. Medicare covers a tetanus if you have a cut or wound; otherwise, there may be a charge. If you had not had a tetanus with pertusses, known as the Tdap, you can take this anytime.   Given B12 today   Shingrix is a vaccine for the prevention of Shingles in Adults 50 and older.  If you are on Medicare, the shingrix is covered under your Part D plan, so you will take both of the vaccines in the series at your pharmacy. Please check with your benefits regarding applicable copays or out of pocket expenses.  The Shingrix is given in 2 vaccines approx 8 weeks apart. You must receive the 2nd dose prior to 6 months from receipt of the first. Please have the pharmacist print out you Immunization  dates for our office records       These are the goals we discussed: Goals    . Attending meditation / other stress management classes     Would do "relaxation"  Would love to do tai chi again  "A Matter of Balance" Class  Wednesdays - April 2 to May 21  Date: 08/11/2012 3:00 PM - 4:30 PM  Cost: Free  Location: [facility] [place], [place]  Add to my Calendar   This is an evidence-based program designed to reduce the fear of falling and improve activity levels among older adults. Classes include group discussions, mutual problem solving, and exercises to improve strength, coordination and balance. Registration is required, as space is limited. Attendance at all sessions is necessary for successful completion of the class. Please call [phone] to register. Funding for the class is made possible by a grant from the [employer]. Contributions are accepted. The last day to join the class is Wednesday, April 16.    [place] and recreation; Tai Chi  Can go to [facility] if needed   ;    . Exercise 150 minutes per week (moderate activity)     Is considering aerobics        This is a list of the screening recommended for you and due dates:  Health Maintenance  Topic Date Due  . Tetanus Vaccine  10/29/1951  . Mammogram  01/13/2017  . Flu Shot  11/26/2017  . DEXA scan (bone density measurement)  Completed  . Pneumonia vaccines  Completed      Fall Prevention in the Home Falls can cause injuries. They can happen to people of all ages. There are many things you can do to make your home safe and to help prevent falls. What can I do on the outside of my home?  Regularly fix the edges of walkways and driveways and fix any cracks.  Remove anything that might make you trip as you walk through a door, such as a raised step or threshold.  Trim any bushes or trees on the path to your home.  Use bright outdoor lighting.  Clear any walking paths of anything that might make someone trip, such as rocks or tools.  Regularly check to see if handrails are loose or broken. Make sure that both  sides of any steps have handrails.  Any raised decks and porches should have guardrails on the edges.  Have any leaves, snow, or ice cleared regularly.  Use sand or salt on walking paths during winter.  Clean up any spills in your garage right away. This includes oil or grease spills. What can I do in the bathroom?  Use night lights.  Install grab bars by the toilet and in the tub and shower. Do not use towel bars as grab bars.  Use non-skid mats or decals in the tub or shower.  If you need to sit down in the shower, use a plastic, non-slip stool.  Keep the floor dry. Clean up any water that spills on the floor as soon as it happens.  Remove soap buildup in the tub or shower  regularly.  Attach bath mats securely with double-sided non-slip rug tape.  Do not have throw rugs and other things on the floor that can make you trip. What can I do in the bedroom?  Use night lights.  Make sure that you have a light by your bed that is easy to reach.  Do not use any sheets or blankets that are too big for your bed. They should not hang down onto the floor.  Have a firm chair that has side arms. You can use this for support while you get dressed.  Do not have throw rugs and other things on the floor that can make you trip. What can I do in the kitchen?  Clean up any spills right away.  Avoid walking on wet floors.  Keep items that you use a lot in easy-to-reach places.  If you need to reach something above you, use a strong step stool that has a grab bar.  Keep electrical cords out of the way.  Do not use floor polish or wax that makes floors slippery. If you must use wax, use non-skid floor wax.  Do not have throw rugs and other things on the floor that can make you trip. What can I do with my stairs?  Do not leave any items on the stairs.  Make sure that there are handrails on both sides of the stairs and use them. Fix handrails that are broken or loose. Make sure that handrails are as long as the stairways.  Check any carpeting to make sure that it is firmly attached to the stairs. Fix any carpet that is loose or worn.  Avoid having throw rugs at the top or bottom of the stairs. If you do have throw rugs, attach them to the floor with carpet tape.  Make sure that you have a light switch at the top of the stairs and the bottom of the stairs. If you do not have them, ask someone to add them for you. What else can I do to help prevent falls?  Wear shoes that: ? Do not have high heels. ? Have rubber bottoms. ? Are comfortable and fit you well. ? Are closed at the toe. Do not wear sandals.  If you use a stepladder: ? Make sure that it is fully  opened. Do not climb a closed stepladder. ? Make sure that both sides of the stepladder are locked into place. ? Ask someone to hold it for you, if possible.  Clearly mark and make sure that you can see: ? Any grab bars or handrails. ? First and last steps. ? Where the edge of each step is.  Use tools that  help you move around (mobility aids) if they are needed. These include: ? Canes. ? Walkers. ? Scooters. ? Crutches.  Turn on the lights when you go into a dark area. Replace any light bulbs as soon as they burn out.  Set up your furniture so you have a clear path. Avoid moving your furniture around.  If any of your floors are uneven, fix them.  If there are any pets around you, be aware of where they are.  Review your medicines with your doctor. Some medicines can make you feel dizzy. This can increase your chance of falling. Ask your doctor what other things that you can do to help prevent falls. This information is not intended to replace advice given to you by your health care provider. Make sure you discuss any questions you have with your health care provider. Document Released: 02/08/2009 Document Revised: 09/20/2015 Document Reviewed: 05/19/2014 Elsevier Interactive Patient Education  2018 [place] Maintenance, Female Adopting a healthy lifestyle and getting preventive care can go a long way to promote health and wellness. Talk with your health care provider about what schedule of regular examinations is right for you. This is a good chance for you to check in with your provider about disease prevention and staying healthy. In between checkups, there are plenty of things you can do on your own. Experts have done a lot of research about which lifestyle changes and preventive measures are most likely to keep you healthy. Ask your health care provider for more information. Weight and diet Eat a healthy diet  Be sure to include plenty of vegetables, fruits,  low-fat dairy products, and lean protein.  Do not eat a lot of foods high in solid fats, added sugars, or salt.  Get regular exercise. This is one of the most important things you can do for your health. ? Most adults should exercise for at least 150 minutes each week. The exercise should increase your heart rate and make you sweat (moderate-intensity exercise). ? Most adults should also do strengthening exercises at least twice a week. This is in addition to the moderate-intensity exercise.  Maintain a healthy weight  Body mass index (BMI) is a measurement that can be used to identify possible weight problems. It estimates body fat based on height and weight. Your health care provider can help determine your BMI and help you achieve or maintain a healthy weight.  For females 74 years of age and older: ? A BMI below 18.5 is considered underweight. ? A BMI of 18.5 to 24.9 is normal. ? A BMI of 25 to 29.9 is considered overweight. ? A BMI of 30 and above is considered obese.  Watch levels of cholesterol and blood lipids  You should start having your blood tested for lipids and cholesterol at 82 years of age, then have this test every 5 years.  You may need to have your cholesterol levels checked more often if: ? Your lipid or cholesterol levels are high. ? You are older than 82 years of age. ? You are at high risk for heart disease.  Cancer screening Lung Cancer  Lung cancer screening is recommended for adults 61-50 years old who are at high risk for lung cancer because of a history of smoking.  A yearly low-dose CT scan of the lungs is recommended for people who: ? Currently smoke. ? Have quit within the past 15 years. ? Have at least a 30-pack-year history of smoking. A pack year is  smoking an average of one pack of cigarettes a day for 1 year.  Yearly screening should continue until it has been 15 years since you quit.  Yearly screening should stop if you develop a health  problem that would prevent you from having lung cancer treatment.  Breast Cancer  Practice breast self-awareness. This means understanding how your breasts normally appear and feel.  It also means doing regular breast self-exams. Let your health care provider know about any changes, no matter how small.  If you are in your 20s or 30s, you should have a clinical breast exam (CBE) by a health care provider every 1-3 years as part of a regular health exam.  If you are 40 or older, have a CBE every year. Also consider having a breast X-ray (mammogram) every year.  If you have a family history of breast cancer, talk to your health care provider about genetic screening.  If you are at high risk for breast cancer, talk to your health care provider about having an MRI and a mammogram every year.  Breast cancer gene (BRCA) assessment is recommended for women who have family members with BRCA-related cancers. BRCA-related cancers include: ? Breast. ? Ovarian. ? Tubal. ? Peritoneal cancers.  Results of the assessment will determine the need for genetic counseling and BRCA1 and BRCA2 testing.  Cervical Cancer Your health care provider may recommend that you be screened regularly for cancer of the pelvic organs (ovaries, uterus, and vagina). This screening involves a pelvic examination, including checking for microscopic changes to the surface of your cervix (Pap test). You may be encouraged to have this screening done every 3 years, beginning at age 64.  For women ages 74-65, health care providers may recommend pelvic exams and Pap testing every 3 years, or they may recommend the Pap and pelvic exam, combined with testing for human papilloma virus (HPV), every 5 years. Some types of HPV increase your risk of cervical cancer. Testing for HPV may also be done on women of any age with unclear Pap test results.  Other health care providers may not recommend any screening for nonpregnant women who are  considered low risk for pelvic cancer and who do not have symptoms. Ask your health care provider if a screening pelvic exam is right for you.  If you have had past treatment for cervical cancer or a condition that could lead to cancer, you need Pap tests and screening for cancer for at least 20 years after your treatment. If Pap tests have been discontinued, your risk factors (such as having a new sexual partner) need to be reassessed to determine if screening should resume. Some women have medical problems that increase the chance of getting cervical cancer. In these cases, your health care provider may recommend more frequent screening and Pap tests.  Colorectal Cancer  This type of cancer can be detected and often prevented.  Routine colorectal cancer screening usually begins at 82 years of age and continues through 82 years of age.  Your health care provider may recommend screening at an earlier age if you have risk factors for colon cancer.  Your health care provider may also recommend using home test kits to check for hidden blood in the stool.  A small camera at the end of a tube can be used to examine your colon directly (sigmoidoscopy or colonoscopy). This is done to check for the earliest forms of colorectal cancer.  Routine screening usually begins at age 6.  Direct examination  of the colon should be repeated every 5-10 years through 82 years of age. However, you may need to be screened more often if early forms of precancerous polyps or small growths are found.  Skin Cancer  Check your skin from head to toe regularly.  Tell your health care provider about any new moles or changes in moles, especially if there is a change in a mole's shape or color.  Also tell your health care provider if you have a mole that is larger than the size of a pencil eraser.  Always use sunscreen. Apply sunscreen liberally and repeatedly throughout the day.  Protect yourself by wearing long  sleeves, pants, a wide-brimmed hat, and sunglasses whenever you are outside.  Heart disease, diabetes, and high blood pressure  High blood pressure causes heart disease and increases the risk of stroke. High blood pressure is more likely to develop in: ? People who have blood pressure in the high end of the normal range (130-139/85-89 mm Hg). ? People who are overweight or obese. ? People who are African American.  If you are 34-86 years of age, have your blood pressure checked every 3-5 years. If you are 6 years of age or older, have your blood pressure checked every year. You should have your blood pressure measured twice-once when you are at a hospital or clinic, and once when you are not at a hospital or clinic. Record the average of the two measurements. To check your blood pressure when you are not at a hospital or clinic, you can use: ? An automated blood pressure machine at a pharmacy. ? A home blood pressure monitor.  If you are between 9 years and 76 years old, ask your health care provider if you should take aspirin to prevent strokes.  Have regular diabetes screenings. This involves taking a blood sample to check your fasting blood sugar level. ? If you are at a normal weight and have a low risk for diabetes, have this test once every three years after 82 years of age. ? If you are overweight and have a high risk for diabetes, consider being tested at a younger age or more often. Preventing infection Hepatitis B  If you have a higher risk for hepatitis B, you should be screened for this virus. You are considered at high risk for hepatitis B if: ? You were born in a country where hepatitis B is common. Ask your health care provider which countries are considered high risk. ? Your parents were born in a high-risk country, and you have not been immunized against hepatitis B (hepatitis B vaccine). ? You have HIV or AIDS. ? You use needles to inject street drugs. ? You live with  someone who has hepatitis B. ? You have had sex with someone who has hepatitis B. ? You get hemodialysis treatment. ? You take certain medicines for conditions, including cancer, organ transplantation, and autoimmune conditions.  Hepatitis C  Blood testing is recommended for: ? Everyone born from [birth year] through 1963. ? Anyone with known risk factors for hepatitis C.  Sexually transmitted infections (STIs)  You should be screened for sexually transmitted infections (STIs) including gonorrhea and chlamydia if: ? You are sexually active and are younger than 82 years of age. ? You are older than 82 years of age and your health care provider tells you that you are at risk for this type of infection. ? Your sexual activity has changed since you were last screened and you are  at an increased risk for chlamydia or gonorrhea. Ask your health care provider if you are at risk.  If you do not have HIV, but are at risk, it may be recommended that you take a prescription medicine daily to prevent HIV infection. This is called pre-exposure prophylaxis (PrEP). You are considered at risk if: ? You are sexually active and do not regularly use condoms or know the HIV status of your partner(s). ? You take drugs by injection. ? You are sexually active with a partner who has HIV.  Talk with your health care provider about whether you are at high risk of being infected with HIV. If you choose to begin PrEP, you should first be tested for HIV. You should then be tested every 3 months for as long as you are taking PrEP. Pregnancy  If you are premenopausal and you may become pregnant, ask your health care provider about preconception counseling.  If you may become pregnant, take 400 to 800 micrograms (mcg) of folic acid every day.  If you want to prevent pregnancy, talk to your health care provider about birth control (contraception). Osteoporosis and menopause  Osteoporosis is a disease in which the bones lose  minerals and strength with aging. This can result in serious bone fractures. Your risk for osteoporosis can be identified using a bone density scan.  If you are 8 years of age or older, or if you are at risk for osteoporosis and fractures, ask your health care provider if you should be screened.  Ask your health care provider whether you should take a calcium or vitamin D supplement to lower your risk for osteoporosis.  Menopause may have certain physical symptoms and risks.  Hormone replacement therapy may reduce some of these symptoms and risks. Talk to your health care provider about whether hormone replacement therapy is right for you. Follow these instructions at home:  Schedule regular health, dental, and eye exams.  Stay current with your immunizations.  Do not use any tobacco products including cigarettes, chewing tobacco, or electronic cigarettes.  If you are pregnant, do not drink alcohol.  If you are breastfeeding, limit how much and how often you drink alcohol.  Limit alcohol intake to no more than 1 drink per day for nonpregnant women. One drink equals 12 ounces of beer, 5 ounces of wine, or 1 ounces of hard liquor.  Do not use street drugs.  Do not share needles.  Ask your health care provider for help if you need support or information about quitting drugs.  Tell your health care provider if you often feel depressed.  Tell your health care provider if you have ever been abused or do not feel safe at home. This information is not intended to replace advice given to you by your health care provider. Make sure you discuss any questions you have with your health care provider. Document Released: 10/28/2010 Document Revised: 09/20/2015 Document Reviewed: 01/16/2015 Elsevier Interactive Patient Education  [employer].

## 2017-12-16 DIAGNOSIS — I10 Essential (primary) hypertension: Secondary | ICD-10-CM | POA: Diagnosis not present

## 2017-12-16 DIAGNOSIS — M5416 Radiculopathy, lumbar region: Secondary | ICD-10-CM | POA: Diagnosis not present

## 2017-12-29 ENCOUNTER — Ambulatory Visit (HOSPITAL_COMMUNITY)
Admission: RE | Admit: 2017-12-29 | Discharge: 2017-12-29 | Disposition: A | Discharge status: Home / Self Care | Payer: Medicare Other | Source: Ambulatory Visit | Attending: Cardiology | Admitting: Cardiology

## 2017-12-29 DIAGNOSIS — I6523 Occlusion and stenosis of bilateral carotid arteries: Secondary | ICD-10-CM

## 2017-12-30 DIAGNOSIS — M5416 Radiculopathy, lumbar region: Secondary | ICD-10-CM | POA: Diagnosis not present

## 2017-12-30 DIAGNOSIS — M545 Low back pain: Secondary | ICD-10-CM | POA: Diagnosis not present

## 2018-01-01 DIAGNOSIS — M5416 Radiculopathy, lumbar region: Secondary | ICD-10-CM | POA: Diagnosis not present

## 2018-01-01 DIAGNOSIS — M545 Low back pain: Secondary | ICD-10-CM | POA: Diagnosis not present

## 2018-01-04 DIAGNOSIS — M545 Low back pain: Secondary | ICD-10-CM | POA: Diagnosis not present

## 2018-01-04 DIAGNOSIS — M5416 Radiculopathy, lumbar region: Secondary | ICD-10-CM | POA: Diagnosis not present

## 2018-01-07 DIAGNOSIS — M5416 Radiculopathy, lumbar region: Secondary | ICD-10-CM | POA: Diagnosis not present

## 2018-01-07 DIAGNOSIS — M545 Low back pain: Secondary | ICD-10-CM | POA: Diagnosis not present

## 2018-01-11 DIAGNOSIS — M545 Low back pain: Secondary | ICD-10-CM | POA: Diagnosis not present

## 2018-01-11 DIAGNOSIS — M5416 Radiculopathy, lumbar region: Secondary | ICD-10-CM | POA: Diagnosis not present

## 2018-01-14 DIAGNOSIS — M545 Low back pain: Secondary | ICD-10-CM | POA: Diagnosis not present

## 2018-01-14 DIAGNOSIS — M5416 Radiculopathy, lumbar region: Secondary | ICD-10-CM | POA: Diagnosis not present

## 2018-01-19 ENCOUNTER — Ambulatory Visit (INDEPENDENT_AMBULATORY_CARE_PROVIDER_SITE_OTHER): Payer: Medicare Other | Admitting: *Deleted

## 2018-01-19 DIAGNOSIS — E538 Deficiency of other specified B group vitamins: Secondary | ICD-10-CM

## 2018-01-19 DIAGNOSIS — M5416 Radiculopathy, lumbar region: Secondary | ICD-10-CM | POA: Diagnosis not present

## 2018-01-19 DIAGNOSIS — M545 Low back pain: Secondary | ICD-10-CM | POA: Diagnosis not present

## 2018-01-19 MED ORDER — CYANOCOBALAMIN 1000 MCG/ML IJ SOLN
1000.0000 ug | Freq: Once | INTRAMUSCULAR | Status: AC
Start: 2018-01-19 — End: 2018-01-19
  Administered 2018-01-19: 1000 ug via INTRAMUSCULAR

## 2018-01-22 DIAGNOSIS — M5416 Radiculopathy, lumbar region: Secondary | ICD-10-CM | POA: Diagnosis not present

## 2018-01-22 DIAGNOSIS — M545 Low back pain: Secondary | ICD-10-CM | POA: Diagnosis not present

## 2018-01-25 ENCOUNTER — Other Ambulatory Visit: Payer: Self-pay | Admitting: Family Medicine

## 2018-01-26 DIAGNOSIS — H43813 Vitreous degeneration, bilateral: Secondary | ICD-10-CM | POA: Diagnosis not present

## 2018-01-26 DIAGNOSIS — H353131 Nonexudative age-related macular degeneration, bilateral, early dry stage: Secondary | ICD-10-CM | POA: Diagnosis not present

## 2018-02-02 DIAGNOSIS — M545 Low back pain: Secondary | ICD-10-CM | POA: Diagnosis not present

## 2018-02-02 DIAGNOSIS — M5416 Radiculopathy, lumbar region: Secondary | ICD-10-CM | POA: Diagnosis not present

## 2018-02-03 ENCOUNTER — Encounter: Payer: Self-pay | Admitting: Podiatry

## 2018-02-03 ENCOUNTER — Ambulatory Visit: Payer: Medicare Other | Admitting: Podiatry

## 2018-02-03 DIAGNOSIS — M79672 Pain in left foot: Secondary | ICD-10-CM | POA: Diagnosis not present

## 2018-02-03 DIAGNOSIS — M79671 Pain in right foot: Secondary | ICD-10-CM

## 2018-02-03 DIAGNOSIS — L600 Ingrowing nail: Secondary | ICD-10-CM

## 2018-02-03 DIAGNOSIS — B351 Tinea unguium: Secondary | ICD-10-CM | POA: Diagnosis not present

## 2018-02-03 NOTE — Progress Notes (Signed)
Subjective: 84 y.o. year old female patient presents complaining of painful nails. Patient requests toe nails trimmed. Doing better reaching down to feet since having done physical therapy.  Objective: Dermatologic: Thick yellow deformed nails x 10. Vascular: Pedal pulses are all palpable. Orthopedic: No gross deformities noted. Neurologic: All epicritic and tactile sensations grossly intact.  Assessment: Dystrophic mycotic nails x 10. Painful nails.  Treatment: All mycotic nails debrided.  Return as needed.

## 2018-02-03 NOTE — Patient Instructions (Signed)
Seen for hypertrophic nails. All nails debrided. Return in 3 months or as needed.  

## 2018-02-04 DIAGNOSIS — M5416 Radiculopathy, lumbar region: Secondary | ICD-10-CM | POA: Diagnosis not present

## 2018-02-04 DIAGNOSIS — M545 Low back pain: Secondary | ICD-10-CM | POA: Diagnosis not present

## 2018-02-11 DIAGNOSIS — M545 Low back pain: Secondary | ICD-10-CM | POA: Diagnosis not present

## 2018-02-11 DIAGNOSIS — M5416 Radiculopathy, lumbar region: Secondary | ICD-10-CM | POA: Diagnosis not present

## 2018-02-16 ENCOUNTER — Other Ambulatory Visit: Payer: Self-pay | Admitting: Family Medicine

## 2018-02-16 DIAGNOSIS — M5416 Radiculopathy, lumbar region: Secondary | ICD-10-CM | POA: Diagnosis not present

## 2018-02-16 DIAGNOSIS — M545 Low back pain: Secondary | ICD-10-CM | POA: Diagnosis not present

## 2018-02-18 DIAGNOSIS — M5416 Radiculopathy, lumbar region: Secondary | ICD-10-CM | POA: Diagnosis not present

## 2018-02-18 DIAGNOSIS — M545 Low back pain: Secondary | ICD-10-CM | POA: Diagnosis not present

## 2018-02-22 ENCOUNTER — Encounter: Payer: Self-pay | Admitting: Physician Assistant

## 2018-03-02 DIAGNOSIS — M5416 Radiculopathy, lumbar region: Secondary | ICD-10-CM | POA: Diagnosis not present

## 2018-03-02 DIAGNOSIS — M545 Low back pain: Secondary | ICD-10-CM | POA: Diagnosis not present

## 2018-03-03 ENCOUNTER — Encounter: Payer: Self-pay | Admitting: Family Medicine

## 2018-03-03 ENCOUNTER — Ambulatory Visit (INDEPENDENT_AMBULATORY_CARE_PROVIDER_SITE_OTHER): Payer: Medicare Other | Admitting: Family Medicine

## 2018-03-03 ENCOUNTER — Other Ambulatory Visit: Payer: Self-pay

## 2018-03-03 VITALS — BP 144/62 | HR 59 | Temp 97.7°F | Ht 63.5 in | Wt 102.6 lb

## 2018-03-03 DIAGNOSIS — I10 Essential (primary) hypertension: Secondary | ICD-10-CM

## 2018-03-03 DIAGNOSIS — R252 Cramp and spasm: Secondary | ICD-10-CM | POA: Diagnosis not present

## 2018-03-03 DIAGNOSIS — E538 Deficiency of other specified B group vitamins: Secondary | ICD-10-CM

## 2018-03-03 DIAGNOSIS — Z23 Encounter for immunization: Secondary | ICD-10-CM

## 2018-03-03 LAB — BASIC METABOLIC PANEL
BUN: 26 mg/dL — ABNORMAL HIGH (ref 6–23)
CO2: 27 mEq/L (ref 19–32)
Calcium: 9.3 mg/dL (ref 8.4–10.5)
Chloride: 104 mEq/L (ref 96–112)
Creatinine, Ser: 0.95 mg/dL (ref 0.40–1.20)
GFR: 59.66 mL/min — ABNORMAL LOW (ref >60.00)
Glucose, Bld: 124 mg/dL — ABNORMAL HIGH (ref 70–99)
Potassium: 3.5 mEq/L (ref 3.5–5.1)
Sodium: 138 mEq/L (ref 135–145)

## 2018-03-03 LAB — MAGNESIUM: Magnesium: 2.3 mg/dL (ref 1.5–2.5)

## 2018-03-03 MED ORDER — CYANOCOBALAMIN 1000 MCG/ML IJ SOLN
1000.0000 ug | Freq: Once | INTRAMUSCULAR | Status: AC
  Administered 2018-03-03: 1000 ug via INTRAMUSCULAR

## 2018-03-03 NOTE — Patient Instructions (Signed)
Muscle Cramps and Spasms Muscle cramps and spasms occur when a muscle or muscles tighten and you have no control over this tightening (involuntary muscle contraction). They are a common problem and can develop in any muscle. The most common place is in the calf muscles of the leg. Muscle cramps and muscle spasms are both involuntary muscle contractions, but there are some differences between the two:  Muscle cramps are painful. They come and go and may last a few seconds to 15 minutes. Muscle cramps are often more forceful and last longer than muscle spasms.  Muscle spasms may or may not be painful. They may also last just a few seconds or much longer.  Certain medical conditions, such as diabetes or Parkinson disease, can make it more likely to develop cramps or spasms. However, cramps or spasms are usually not caused by a serious underlying problem. Common causes include:  Overexertion.  Overuse from repetitive motions, or doing the same thing over and over.  Remaining in a certain position for a long period of time.  Improper preparation, form, or technique while playing a sport or doing an activity.  Dehydration.  Injury.  Side effects of some medicines.  Abnormally low levels of the salts and ions in your blood (electrolytes), especially potassium and calcium. This could happen if you are taking water pills (diuretics) or if you are pregnant.  In many cases, the cause of muscle cramps or spasms is unknown. Follow these instructions at home:  Stay well hydrated. Drink enough fluid to keep your urine clear or pale yellow.  Try massaging, stretching, and relaxing the affected muscle.  If directed, apply heat to tight or tense muscles as often as told by your health care provider. Use the heat source that your health care provider recommends, such as a moist heat pack or a heating pad. ? Place a towel between your skin and the heat source. ? Leave the heat on for 20-30  minutes. ? Remove the heat if your skin turns bright red. This is especially important if you are unable to feel pain, heat, or cold. You may have a greater risk of getting burned.  If directed, put ice on the affected area. This may help if you are sore or have pain after a cramp or spasm. ? Put ice in a plastic bag. ? Place a towel between your skin and the bag. ? Leavethe ice on for 20 minutes, 2-3 times a day.  Take over-the-counter and prescription medicines only as told by your health care provider.  Pay attention to any changes in your symptoms. Contact a health care provider if:  Your cramps or spasms get more severe or happen more often.  Your cramps or spasms do not improve over time. This information is not intended to replace advice given to you by your health care provider. Make sure you discuss any questions you have with your health care provider. Document Released: 10/04/2001 Document Revised: 05/16/2015 Document Reviewed: 01/16/2015 Elsevier Interactive Patient Education  2018 [place].  Stay well hydrated  Consider OTC B-vitamin such as Dance movement psychotherapist.

## 2018-03-03 NOTE — Progress Notes (Signed)
Subjective:     Patient ID: [name], female   DOB: 07-28-1932, 84 y.o.   MRN: [id number]  HPI Patient is having frequent nighttime cramps in her hands and legs bilaterally- mostly thighs.  She has hypertension history and is on Aldactone.  No history of electrolyte disturbance.  She states she is hydrating fairly well.  She has history of B12 deficiency and gets regular injections and is due today.  Also still needs flu vaccine.  Generally feels well otherwise.  She is getting physical therapy for some ongoing back issues.  She is also doing some tai chi.  Past Medical History:  Diagnosis Date  . Anemia, pernicious   . Arthritis    bursitis   . Atrophic gastritis   . B12 deficiency   . CAD (coronary artery disease)   . Carotid artery stenosis   . Diarrhea    on and off for months  . Family history of adverse reaction to anesthesia    extreme nausea brother and sister  . Fibromyalgia   . Gallbladder polyp   . GERD (gastroesophageal reflux disease)   . Heart murmur   . History of hiatal hernia   . History of kidney stones   . HLD (hyperlipidemia)   . HTN (hypertension)   . Hypothyroidism   . Irritable bowel syndrome (IBS)   . Median arcuate ligament syndrome ([place])   . PVD (peripheral vascular disease) ([place])   . Tubular adenoma of colon 08/2013   with focal high grade dysplasia   Past Surgical History:  Procedure Laterality Date  . ABDOMINAL HYSTERECTOMY    . APPENDECTOMY    . BIOPSY THYROID    . CHOLECYSTECTOMY N/A 10/24/2014   Procedure: MICROSCOPIC  CHOLECYSTECTOMY;  Surgeon: Ralene Ok, MD;  Location: [place];  Service: General;  Laterality: N/A;  . CORONARY ARTERY BYPASS GRAFT  07  . ESOPHAGEAL MANOMETRY N/A 08/02/2012   Procedure: ESOPHAGEAL MANOMETRY (EM);  Surgeon: Sable Feil, MD;  Location: [facility];  Service: Endoscopy;  Laterality: N/A;  . THYROID LOBECTOMY      reports that she has never smoked. She has never used smokeless tobacco. She  reports that she does not drink alcohol or use drugs. family history includes Breast cancer in her other; Cholelithiasis in her mother; Colon cancer in her brother; Diabetes in her sister; Heart disease in her mother; Lung cancer in her father and sister; Prostate cancer in her brother; Stomach cancer in her cousin. Allergies  Allergen Reactions  . Actos [Pioglitazone] Swelling    Swelling  . Sulfonamide Derivatives Swelling  . Codeine Nausea And Vomiting     Review of Systems  Constitutional: Negative for fatigue.  Eyes: Negative for visual disturbance.  Respiratory: Negative for cough, chest tightness, shortness of breath and wheezing.   Cardiovascular: Negative for chest pain, palpitations and leg swelling.  Neurological: Negative for dizziness, seizures, syncope, weakness, light-headedness and headaches.       Objective:   Physical Exam  Constitutional: She appears well-developed and well-nourished.  Eyes: Pupils are equal, round, and reactive to light.  Neck: Neck supple. No JVD present. No thyromegaly present.  Cardiovascular: Normal rate and regular rhythm. Exam reveals no gallop.  Pulmonary/Chest: Effort normal and breath sounds normal. No respiratory distress. She has no wheezes. She has no rales.  Musculoskeletal: She exhibits no edema.  Neurological: She is alert.       Assessment:     #1 hypertension stable  #2 frequent nighttime muscle  cramps in hands and lower extremities    Plan:     -Suggest warm bath prior to bedtime -Stay well-hydrated -Consider B vitamin supplement -Check basic metabolic panel and magnesium level.  [name] MD [facility]

## 2018-03-04 DIAGNOSIS — M5416 Radiculopathy, lumbar region: Secondary | ICD-10-CM | POA: Diagnosis not present

## 2018-03-10 DIAGNOSIS — M545 Low back pain: Secondary | ICD-10-CM | POA: Diagnosis not present

## 2018-03-10 DIAGNOSIS — M5416 Radiculopathy, lumbar region: Secondary | ICD-10-CM | POA: Diagnosis not present

## 2018-03-16 ENCOUNTER — Encounter: Payer: Self-pay | Admitting: Physician Assistant

## 2018-03-16 ENCOUNTER — Ambulatory Visit: Payer: Medicare Other | Admitting: Physician Assistant

## 2018-03-16 VITALS — BP 138/60 | HR 63 | Ht 63.5 in | Wt 102.4 lb

## 2018-03-16 DIAGNOSIS — I2510 Atherosclerotic heart disease of native coronary artery without angina pectoris: Secondary | ICD-10-CM

## 2018-03-16 DIAGNOSIS — I6523 Occlusion and stenosis of bilateral carotid arteries: Secondary | ICD-10-CM | POA: Diagnosis not present

## 2018-03-16 DIAGNOSIS — I340 Nonrheumatic mitral (valve) insufficiency: Secondary | ICD-10-CM

## 2018-03-16 NOTE — Progress Notes (Signed)
Cardiology Office Note    Date:  03/16/2018   ID:  [name], DOB 1932/10/14, MRN [id number]  PCP:  [name], MD  Cardiologist:  Dr. Burt Knack   Chief Complaint: 12 Months follow up  History of Present Illness:   [name] is a 84 y.o. female with hx of CAD s/p CABG in 2007,  mitral regurgitation, HTN and asymptomatic carotid stenosis presents for yearly follow up.   She was doing well on cardiac stand point when last seen by Dr. Burt Knack 02/2017. Last echo 02/2017 showed normal LVEF to 55-60%, mild AI, mild MR (improved from moderate) and grade 2 DD.   Here today for follow up. Completed PT last week for lumbar spinal stenosis. No exertional chest pain or shortness of breath.  Her brother is dealing with final stage of cancer. This required frequent trips to [place]. No orthopnea, PND, syncope, Le edema, dizziness, palpitations or melena.   Past Medical History:  Diagnosis Date  . Anemia, pernicious   . Arthritis    bursitis   . Atrophic gastritis   . B12 deficiency   . CAD (coronary artery disease)   . Carotid artery stenosis   . Diarrhea    on and off for months  . Family history of adverse reaction to anesthesia    extreme nausea brother and sister  . Fibromyalgia   . Gallbladder polyp   . GERD (gastroesophageal reflux disease)   . Heart murmur   . History of hiatal hernia   . History of kidney stones   . HLD (hyperlipidemia)   . HTN (hypertension)   . Hypothyroidism   . Irritable bowel syndrome (IBS)   . Median arcuate ligament syndrome ([place])   . PVD (peripheral vascular disease) ([place])   . Tubular adenoma of colon 08/2013   with focal high grade dysplasia    Past Surgical History:  Procedure Laterality Date  . ABDOMINAL HYSTERECTOMY    . APPENDECTOMY    . BIOPSY THYROID    . CHOLECYSTECTOMY N/A 10/24/2014   Procedure: MICROSCOPIC  CHOLECYSTECTOMY;  Surgeon: Ralene Ok, MD;  Location: [place];  Service: General;  Laterality: N/A;  .  CORONARY ARTERY BYPASS GRAFT  07  . ESOPHAGEAL MANOMETRY N/A 08/02/2012   Procedure: ESOPHAGEAL MANOMETRY (EM);  Surgeon: Sable Feil, MD;  Location: [facility];  Service: Endoscopy;  Laterality: N/A;  . THYROID LOBECTOMY      Current Medications: Prior to Admission medications   Medication Sig Start Date End Date Taking? Authorizing Provider  acetaminophen (TYLENOL) 500 MG tablet Take 500 mg by mouth every 6 (six) hours as needed (pain).    [provider]  amLODipine (NORVASC) 10 MG tablet TAKE 1 TABLET BY MOUTH ONCE DAILY 02/16/18   Burchette, Alinda Sierras, MD  aspirin EC 81 MG tablet Take 81 mg by mouth at bedtime.    [provider]  atorvastatin (LIPITOR) 80 MG tablet TAKE 1 TABLET BY MOUTH AT BEDTIME 01/25/18   Burchette, Alinda Sierras, MD  azelastine (ASTELIN) 0.1 % nasal spray Place 1 spray into both nostrils 2 (two) times daily as needed for rhinitis or allergies. Use in each nostril as directed    [provider]  Calcium Carbonate-Vitamin D (CALTRATE 600+D PO) Take 600 mg by mouth daily with lunch.    [provider]  CARAFATE 1 GM/10ML suspension TAKE 10 MLS (1 G TOTAL) BY MOUTH 2 (TWO) TIMES DAILY. 12/30/16   Milus Banister, MD  carvedilol (COREG) 6.25 MG tablet TAKE 1/2 TABLET BY MOUTH TWICE DAILY 07/15/17   Burchette, Alinda Sierras, MD  Cholecalciferol (VITAMIN D) 2000 UNITS tablet Take 2,000 Units by mouth daily with lunch.    [provider]  cyanocobalamin (,VITAMIN B-12,) 1000 MCG/ML injection Inject 1,000 mcg into the muscle every 30 (thirty) days. On or about the end of each month    [provider]  Cyanocobalamin (B-12) 1000 MCG/ML KIT cyanocobalamin (vit B-12) 1,000 mcg/mL injection kit  Inject 1 mL every month by intramuscular route.    [provider]  fluticasone (FLONASE) 50 MCG/ACT nasal spray Place 1 spray daily as needed into both nostrils for allergies or rhinitis.    [provider]  hydrALAZINE  (APRESOLINE) 50 MG tablet TAKE 1 TABLET BY MOUTH 3 TIMES DAILY 07/21/17   Burchette, Alinda Sierras, MD  Hyoscyamine Sulfate SL (LEVSIN/SL) 0.125 MG SUBL Dissolve 1 tablet on the tongue ,every 8 hours as needed. 07/06/17   Esterwood, Amy S, PA-C  levothyroxine (SYNTHROID, LEVOTHROID) 50 MCG tablet Take 50 mcg by mouth daily before breakfast.    [provider]  losartan (COZAAR) 100 MG tablet TAKE ONE (1) TABLET BY MOUTH EVERY DAY 10/19/17   Burchette, Alinda Sierras, MD  Multiple Vitamins-Minerals (OCUVITE-LUTEIN PO) PreserVision AREDS 2    [provider]  Multiple Vitamins-Minerals (PRESERVISION AREDS 2) CAPS Take 1 tablet by mouth daily with lunch.    [provider]  OVER THE COUNTER MEDICATION Place 1 drop into both eyes 2 (two) times daily as needed (dry eyes). Preservative free over the counter eye drop for dry eyes    [provider]  POTASSIUM PO potassium    [provider]  PREVALITE 4 g packet TAKE 1 PACKET AS DIRECTED DAILY 07/15/17   Milus Banister, MD  sodium chloride (OCEAN) 0.65 % nasal spray Place 1 spray into both nostrils 3 (three) times daily as needed for congestion.     [provider]  spironolactone (ALDACTONE) 25 MG tablet TAKE 1 TABLET BY MOUTH ONCE DAILY 06/30/17   Burchette, Alinda Sierras, MD  zoledronic acid (RECLAST) 5 MG/100ML SOLN injection Reclast    [provider]    Allergies:   Actos [pioglitazone]; Sulfonamide derivatives; and Codeine   Social History   Socioeconomic History  . Marital status: Married    Spouse name: Not on file  . Number of children: Not on file  . Years of education: 72  . Highest education level: Not on file  Occupational History  . Occupation: retired    Fish farm manager: RETIRED  Social Needs  . Financial resource strain: Not on file  . Food insecurity:    Worry: Not on file    Inability: Not on file  . Transportation needs:    Medical: Not on file    Non-medical: Not on file  Tobacco Use    . Smoking status: Never Smoker  . Smokeless tobacco: Never Used  Substance and Sexual Activity  . Alcohol use: No    Alcohol/week: 0.0 standard drinks  . Drug use: No  . Sexual activity: Not on file  Lifestyle  . Physical activity:    Days per week: Not on file    Minutes per session: Not on file  . Stress: Not on file  Relationships  . Social connections:    Talks on phone: Not on file    Gets together: Not on file    Attends religious service: Not on file  Active member of club or organization: Not on file    Attends meetings of clubs or organizations: Not on file    Relationship status: Not on file  Other Topics Concern  . Not on file  Social History Narrative   [name], Married. Lives with husband. Had been a very active woman.     Family History:  The patient's family history includes Breast cancer in her other; Cholelithiasis in her mother; Colon cancer in her brother; Diabetes in her sister; Heart disease in her mother; Lung cancer in her father and sister; Prostate cancer in her brother; Stomach cancer in her cousin.   ROS:   Please see the history of present illness.    ROS All other systems reviewed and are negative.   PHYSICAL EXAM:   VS:  BP 138/60   Pulse 63   Ht 5' 3.5" (1.613 m)   Wt 102 lb 6.4 oz (46.4 kg)   SpO2 97%   BMI 17.85 kg/m    GEN: Well nourished, well developed, in no acute distress  HEENT: normal  Neck: no JVD, carotid bruits, or masses Cardiac:RRR; no murmurs, rubs, or gallops,no edema  Respiratory:  clear to auscultation bilaterally, normal work of breathing GI: soft, nontender, nondistended, + BS MS: no deformity or atrophy  Skin: warm and dry, no rash Neuro:  Alert and Oriented x 3, Strength and sensation are intact Psych: euthymic mood, full affect  Wt Readings from Last 3 Encounters:  03/16/18 102 lb 6.4 oz (46.4 kg)  03/03/18 102 lb 9.6 oz (46.5 kg)  12/14/17 99 lb 6 oz (45.1 kg)      Studies/Labs Reviewed:   EKG:  EKG is  ordered today.  The ekg ordered today demonstrates SR at rate of 63 bpm  Recent Labs: 03/03/2018: BUN 26; Creatinine, Ser 0.95; Magnesium 2.3; Potassium 3.5; Sodium 138   Lipid Panel    Component Value Date/Time   CHOL 135 11/18/2016   TRIG 54 11/18/2016   TRIG 85 02/05/2006 0818   HDL 69.20 11/22/2014 0919   CHOLHDL 2 11/22/2014 0919   VLDL 10.6 11/22/2014 0919   LDLCALC 66 11/22/2014 0919    Additional studies/ records that were reviewed today include:   Echocardiogram: 02/2017 Study Conclusions  - Left ventricle: The cavity size was normal. Wall thickness was   normal. Systolic function was normal. The estimated ejection   fraction was in the range of 55% to 60%. Doppler parameters are   consistent with pseudonormal left ventricular relaxation (grade 2   diastolic dysfunction). The E/e&' ratio is between 8-15,   suggesting indeterminate LV filling pressure. - Aortic valve: Sclerosis without stenosis. There was mild   regurgitation. - Mitral valve: Calcified annulus. Mildly thickened leaflets .   There was mild regurgitation. - Left atrium: Moderately dilated. - Right atrium: The atrium was mildly dilated. - Tricuspid valve: There was mild regurgitation. - Inferior vena cava: The vessel was normal in size. The   respirophasic diameter changes were in the normal range (= 50%),   consistent with normal central venous pressure.  Impressions:  - Compared to a prior echo in 2016, there are few changes.   Carotid doppler 12/2017  Final Interpretation: Right Carotid: Velocities in the right ICA are consistent with a 1-39% stenosis.        The RICA velocities remain within normal range and have decreased        compared to the prior exam.  Left Carotid: Velocities in the left ICA  are consistent with a 1-39% stenosis.       The ECA appears >50% stenosed. The LICA velocities remain within       normal range is essentially stable compared to  the prior exam.  ASSESSMENT & PLAN:    1. Chronic diastolic CHF Euvolemic. No change.   2. CAD s/p CABG No angina. Continue ASA, statin and BB.   3. Carotid plaque - Last doppler as above 12/2017 showed stable plaque. Recommended clinical follow-up, no further imaging needed.  4. Mitral regurgitation - improved to mild by last echo 02/2017. Plan to follow clinically.   Medication Adjustments/Labs and Tests Ordered: Current medicines are reviewed at length with the patient today.  Concerns regarding medicines are outlined above.  Medication changes, Labs and Tests ordered today are listed in the Patient Instructions below. Patient Instructions  Medication Instructions:  Your physician recommends that you continue on your current medications as directed. Please refer to the Current Medication list given to you today.  If you need a refill on your cardiac medications before your next appointment, please call your pharmacy.   Lab work: NONE ORDERED TODAY If you have labs (blood work) drawn today and your tests are completely normal, you will receive your results only by: Marland Kitchen MyChart Message (if you have MyChart) OR . A paper copy in the mail If you have any lab test that is abnormal or we need to change your treatment, we will call you to review the results.  Testing/Procedures: NONE ORDERED TODAY  Follow-Up: At [facility], you and your health needs are our priority.  As part of our continuing mission to provide you with exceptional heart care, we have created designated Provider Care Teams.  These Care Teams include your primary Cardiologist (physician) and Advanced Practice Providers (APPs -  Physician Assistants and Nurse Practitioners) who all work together to provide you with the care you need, when you need it. You will need a follow up appointment in:  1 years.  Please call our office 2 months in advance to schedule this appointment.  You may see DR. TURNER or one of the  following Advanced Practice Providers on your designated Care Team: Richardson Dopp, PA-C Vin Camas, [place] . Daune Perch, NP  Any Other Special Instructions Will Be Listed Below (If Applicable).       Jarrett Soho, [place]  03/16/2018 3:03 PM    [facility] [address], [place], [place]  [address] Phone: [phone]; Fax: [phone]

## 2018-03-16 NOTE — Patient Instructions (Signed)
Medication Instructions:  Your physician recommends that you continue on your current medications as directed. Please refer to the Current Medication list given to you today.  If you need a refill on your cardiac medications before your next appointment, please call your pharmacy.   Lab work: NONE ORDERED TODAY If you have labs (blood work) drawn today and your tests are completely normal, you will receive your results only by: Marland Kitchen MyChart Message (if you have MyChart) OR . A paper copy in the mail If you have any lab test that is abnormal or we need to change your treatment, we will call you to review the results.  Testing/Procedures: NONE ORDERED TODAY  Follow-Up: At [facility], you and your health needs are our priority.  As part of our continuing mission to provide you with exceptional heart care, we have created designated Provider Care Teams.  These Care Teams include your primary Cardiologist (physician) and Advanced Practice Providers (APPs -  Physician Assistants and Nurse Practitioners) who all work together to provide you with the care you need, when you need it. You will need a follow up appointment in:  1 years.  Please call our office 2 months in advance to schedule this appointment.  You may see DR. TURNER or one of the following Advanced Practice Providers on your designated Care Team: Richardson Dopp, PA-C Vin Volin, [place] . Daune Perch, NP  Any Other Special Instructions Will Be Listed Below (If Applicable).

## 2018-04-01 ENCOUNTER — Ambulatory Visit: Payer: Self-pay

## 2018-04-01 ENCOUNTER — Other Ambulatory Visit: Payer: Self-pay | Admitting: Family Medicine

## 2018-04-01 ENCOUNTER — Ambulatory Visit (INDEPENDENT_AMBULATORY_CARE_PROVIDER_SITE_OTHER): Payer: Medicare Other | Admitting: Family Medicine

## 2018-04-01 ENCOUNTER — Ambulatory Visit (INDEPENDENT_AMBULATORY_CARE_PROVIDER_SITE_OTHER): Payer: Medicare Other

## 2018-04-01 ENCOUNTER — Encounter: Payer: Self-pay | Admitting: Family Medicine

## 2018-04-01 VITALS — BP 120/65 | HR 90 | Temp 98.1°F | Wt 96.5 lb

## 2018-04-01 DIAGNOSIS — R55 Syncope and collapse: Secondary | ICD-10-CM | POA: Diagnosis not present

## 2018-04-01 DIAGNOSIS — R0781 Pleurodynia: Secondary | ICD-10-CM | POA: Diagnosis not present

## 2018-04-01 DIAGNOSIS — S20212A Contusion of left front wall of thorax, initial encounter: Secondary | ICD-10-CM | POA: Diagnosis not present

## 2018-04-01 DIAGNOSIS — S299XXA Unspecified injury of thorax, initial encounter: Secondary | ICD-10-CM | POA: Diagnosis not present

## 2018-04-01 DIAGNOSIS — I499 Cardiac arrhythmia, unspecified: Secondary | ICD-10-CM

## 2018-04-01 DIAGNOSIS — I4891 Unspecified atrial fibrillation: Secondary | ICD-10-CM

## 2018-04-01 NOTE — Progress Notes (Signed)
   Subjective:    Patient ID: [name], female    DOB: August 08, 1932, 84 y.o.   MRN: [id number]  HPI Here with her husband to check on injuries from a fall at home yesterday morning. She says she had not been feeling well for about 24 hours prior to this with some weakness and a mild ST. No chest pain or SOB or palpitations. She went into her bathroom to urinate and then she apparently passed out. She woke up lying on the floor with soreness in the left ribs but no other injuries. She did not strike her head. She has no idea how long she had lost consciousness. She was able to pull herself up and to call her husband. Since then she has had soreness in the left ribs but feels fine in general. Of note she has a hx of CAD and is S/P a CABG. An ECHO last November showed a normal LVEF of 55-60% with mild MR and aortic sclerosis. She had carotid dopplers in September showing mild stable plaque with bilateral stenoses of 1-39%. She was seen in the Cardiology clinic on 03-16-18 with a normal exam, and her EKG that day showed sinus rhythm.    Review of Systems  Constitutional: Negative.   Respiratory: Negative.   Cardiovascular: Positive for chest pain. Negative for palpitations and leg swelling.  Gastrointestinal: Negative.   Genitourinary: Negative.   Neurological: Positive for syncope. Negative for dizziness, tremors, seizures, facial asymmetry, speech difficulty, weakness, light-headedness, numbness and headaches.       Objective:   Physical Exam  Constitutional: She is oriented to person, place, and time.  Alert, in a wheelchair   HENT:  Head: Normocephalic and atraumatic.  Neck: No thyromegaly present.  Cardiovascular: Normal rate, normal heart sounds and intact distal pulses.  No murmur heard. Very irregular rhythm. The EKG shows atrial fibrillation with a controlled ventricular rate   Pulmonary/Chest: Effort normal and breath sounds normal. No stridor. No respiratory distress. She has  no wheezes. She has no rales.  Abdominal: Soft. Bowel sounds are normal. She exhibits no distension and no mass. There is no tenderness. There is no rebound and no guarding.  Musculoskeletal:  There is a small ecchymosis over the left anterior lower ribs and this area is tender. No crepitus   Lymphadenopathy:    She has no cervical adenopathy.  Neurological: She is alert and oriented to person, place, and time. No cranial nerve deficit. She exhibits normal muscle tone.  A CXR today shows no acute pulmonary issues and no fractured ribs.         Assessment & Plan:  She has bruised ribs from a fall yesterday that was the result of a syncopal event. This was likely the result of new onset atrial fibrillation. She is given samples to begin anticoagulation with Eliquis 5 mg BID and she is already on aspirin 81 mg daily. Her BP and ventricular rate are controlled so we will maintain her other medications. She will follow up with Cardiology on 04-05-18.  Alysia Penna, MD

## 2018-04-01 NOTE — Telephone Encounter (Signed)
Incoming  Call from Patients the left side of her rib cage.  Tried to eat but throat hurt.  Injury occurred yesterday.  Patient states that she woke up in the corner of the bathroom.  Denies any bruising.  No bleeding noted. Patient states that she has to lay on the right side. Denies bleeding.  Patient states its bothering    to move.  Provided an appointmentat  [phone]  for today with Dr.  Betty Martinique @1200pm .  Provided care advice.  Patient voiced understanding.                               Reason for Disposition . SEVERE chest pain  Answer Assessment - Initial Assessment Questions 1. MECHANISM: "How did the injury happen?"     I dont know, sent all day yesterday in bed.  Passed out   2. ONSET: "When did the injury happen?" (Minutes or hours ago)     Yesterday fell in the bathroom 3. LOCATION: "Where on the chest is the injury located?"     Under ribs on left side. 4. APPEARANCE: "What does the injury look like?"     no 5. BLEEDING: "Is there any bleeding now? If so, ask: How long has it been bleeding?"     no 6. SEVERITY: "Any difficulty with breathing?"      A little bit  , have to lay on right  7. SIZE: For cuts, bruises, or swelling, ask: "How large is it?" (e.g., inches or centimeters)     na 8. PAIN: "Is there pain?" If so, ask: "How bad is the pain?"   (e.g., Scale 1-10; or mild, moderate, severe)     Bothering me to try to move,   9. TETANUS: For any breaks in the skin, ask: "When was the last tetanus booster?"     na 10. PREGNANCY: "Is there any chance you are pregnant?" "When was your last menstrual period?"       *No Answer*  Protocols used: CHEST INJURY-A-AH

## 2018-04-05 ENCOUNTER — Ambulatory Visit: Payer: Medicare Other | Admitting: Cardiology

## 2018-04-05 ENCOUNTER — Ambulatory Visit: Payer: Medicare Other | Admitting: Family Medicine

## 2018-04-05 ENCOUNTER — Encounter: Payer: Self-pay | Admitting: Cardiology

## 2018-04-05 VITALS — BP 140/60 | HR 77 | Ht 63.5 in | Wt 99.8 lb

## 2018-04-05 DIAGNOSIS — I480 Paroxysmal atrial fibrillation: Secondary | ICD-10-CM | POA: Diagnosis not present

## 2018-04-05 DIAGNOSIS — D649 Anemia, unspecified: Secondary | ICD-10-CM | POA: Diagnosis not present

## 2018-04-05 DIAGNOSIS — R531 Weakness: Secondary | ICD-10-CM

## 2018-04-05 DIAGNOSIS — I4819 Other persistent atrial fibrillation: Secondary | ICD-10-CM | POA: Diagnosis not present

## 2018-04-05 DIAGNOSIS — I2510 Atherosclerotic heart disease of native coronary artery without angina pectoris: Secondary | ICD-10-CM

## 2018-04-05 DIAGNOSIS — I5032 Chronic diastolic (congestive) heart failure: Secondary | ICD-10-CM | POA: Diagnosis not present

## 2018-04-05 DIAGNOSIS — I10 Essential (primary) hypertension: Secondary | ICD-10-CM

## 2018-04-05 DIAGNOSIS — I351 Nonrheumatic aortic (valve) insufficiency: Secondary | ICD-10-CM

## 2018-04-05 LAB — CBC
Hematocrit: 35.9 % (ref 34.0–46.6)
Hemoglobin: 11.4 g/dL (ref 11.1–15.9)
MCH: 25.4 pg — ABNORMAL LOW (ref 26.6–33.0)
MCHC: 31.8 g/dL (ref 31.5–35.7)
MCV: 80 fL (ref 79–97)
Platelets: 254 10*3/uL (ref 150–450)
RBC: 4.48 x10E6/uL (ref 3.77–5.28)
RDW: 14.6 % (ref 12.3–15.4)
WBC: 6.3 10*3/uL (ref 3.4–10.8)

## 2018-04-05 LAB — TSH: TSH: 4.39 u[IU]/mL (ref 0.450–4.500)

## 2018-04-05 MED ORDER — APIXABAN 2.5 MG PO TABS: 5.0000 mg | ORAL_TABLET | Freq: Two times a day (BID) | ORAL | 0 refills | Status: DC

## 2018-04-05 NOTE — Progress Notes (Signed)
Cardiology Office Note:    Date:  04/05/2018   ID:  [name], DOB 14-Jul-1932, MRN [id number]  PCP:  [name], MD  Cardiologist:  Sherren Mocha, MD  Referring MD: [name], MD   Chief Complaint  Patient presents with  . Atrial Fibrillation    History of Present Illness:    [name] is a 84 y.o. female with a past medical history significant for CAD S/P CABG in 2007, mitral regurgitation, hypertension, pernicious anemia and asymptomatic carotid stenosis.  Patient was last seen in our office on 03/16/2018 by Robbie Lis, PA at which time he was doing well.  On 04/01/2018 she was seen by his PCP, Dr. Sarajane Jews, after a fall on the previous day.  She reported that she had not been feeling well, having some weakness but no chest pressure, shortness of breath or palpitations.  She had reported that she went into the bathroom to urinate and then apparently passed out.  She woke up lying on the floor and had soreness of the left ribs.  At the office visit and EKG showed atrial fibrillation with controlled rate.  She was started on Eliquis for anticoagulation.  [name] is here today for evaluation of new onset A. fib with her husband.  Her EKG today shows sinus rhythm.  She tells me that on Saturday, 03/27/2018 she developed nausea and diarrhea that subsequently led to inability to eat or drink adequately she then developed weakness.  On the following Tuesday she began to feel a little bit better.  Stay morning she had gone into the bathroom and sat down to void.  When she stood up to go to the sink and wash her hands she apparently passed out.  She awoke on the floor and does not recall any symptoms prior to her fall.  Does not know if she had any lightheadedness or chest pain.  She was able to get up and go to call her husband as she was home alone.  She stayed in bed all the rest of that day trying to take in some liquids.  She also notes that she had a sore throat at  the time.  Today she says that she is still feeling weak after her illness and has a mild cough.  She had a chest x-ray at her PCP on 04/01/2018 that did not show pneumonia.  The patient does not recall having had any palpitations at the time that she was found to be in A. fib.  Since that time she has had only a rare flutter but has had nothing sustained.  She denies any chest pain, shortness of breath, edema, orthopnea, PND.   Past Medical History:  Diagnosis Date  . Anemia, pernicious   . Arthritis    bursitis   . Atrophic gastritis   . B12 deficiency   . CAD (coronary artery disease)   . Carotid artery stenosis   . Diarrhea    on and off for months  . Family history of adverse reaction to anesthesia    extreme nausea brother and sister  . Fibromyalgia   . Gallbladder polyp   . GERD (gastroesophageal reflux disease)   . Heart murmur   . History of hiatal hernia   . History of kidney stones   . HLD (hyperlipidemia)   . HTN (hypertension)   . Hypothyroidism   . Irritable bowel syndrome (IBS)   . Median arcuate ligament syndrome ([place])   . PVD (  peripheral vascular disease) ([place])   . Tubular adenoma of colon 08/2013   with focal high grade dysplasia    Past Surgical History:  Procedure Laterality Date  . ABDOMINAL HYSTERECTOMY    . APPENDECTOMY    . BIOPSY THYROID    . CHOLECYSTECTOMY N/A 10/24/2014   Procedure: MICROSCOPIC  CHOLECYSTECTOMY;  Surgeon: Ralene Ok, MD;  Location: [facility];  Service: General;  Laterality: N/A;  . CORONARY ARTERY BYPASS GRAFT  07  . ESOPHAGEAL MANOMETRY N/A 08/02/2012   Procedure: ESOPHAGEAL MANOMETRY (EM);  Surgeon: Sable Feil, MD;  Location: [facility];  Service: Endoscopy;  Laterality: N/A;  . THYROID LOBECTOMY      Current Medications: Current Meds  Medication Sig  . acetaminophen (TYLENOL) 500 MG tablet Take 500 mg by mouth every 6 (six) hours as needed (pain).  Marland Kitchen amLODipine (NORVASC) 10 MG tablet TAKE 1 TABLET BY MOUTH ONCE  DAILY  . apixaban (ELIQUIS) 2.5 MG TABS tablet Take 2 tablets (5 mg total) by mouth 2 (two) times daily.  Marland Kitchen aspirin EC 81 MG tablet Take 81 mg by mouth at bedtime.  Marland Kitchen atorvastatin (LIPITOR) 80 MG tablet TAKE 1 TABLET BY MOUTH AT BEDTIME  . azelastine (ASTELIN) 0.1 % nasal spray Place 1 spray into both nostrils 2 (two) times daily as needed for rhinitis or allergies. Use in each nostril as directed  . Calcium Carbonate-Vitamin D (CALTRATE 600+D PO) Take 600 mg by mouth daily with lunch.  Marland Kitchen CARAFATE 1 GM/10ML suspension TAKE 10 MLS (1 G TOTAL) BY MOUTH 2 (TWO) TIMES DAILY.  . carvedilol (COREG) 6.25 MG tablet TAKE 1/2 TABLET BY MOUTH TWICE DAILY  . cyanocobalamin (,VITAMIN B-12,) 1000 MCG/ML injection Inject 1,000 mcg into the muscle every 30 (thirty) days. On or about the end of each month  . Cyanocobalamin (B-12) 1000 MCG/ML KIT cyanocobalamin (vit B-12) 1,000 mcg/mL injection kit  Inject 1 mL every month by intramuscular route.  . fluticasone (FLONASE) 50 MCG/ACT nasal spray Place 1 spray daily as needed into both nostrils for allergies or rhinitis.  . hydrALAZINE (APRESOLINE) 50 MG tablet TAKE 1 TABLET BY MOUTH 3 TIMES DAILY  . levothyroxine (SYNTHROID, LEVOTHROID) 50 MCG tablet Take 50 mcg by mouth daily before breakfast.  . losartan (COZAAR) 100 MG tablet TAKE ONE (1) TABLET BY MOUTH EVERY DAY  . Multiple Vitamins-Minerals (OCUVITE-LUTEIN PO) PreserVision AREDS 2  . sodium chloride (OCEAN) 0.65 % nasal spray Place 1 spray into both nostrils 3 (three) times daily as needed for congestion.   Marland Kitchen spironolactone (ALDACTONE) 25 MG tablet TAKE 1 TABLET BY MOUTH ONCE DAILY  . zoledronic acid (RECLAST) 5 MG/100ML SOLN injection Reclast  . [DISCONTINUED] apixaban (ELIQUIS) 5 MG TABS tablet Take 5 mg by mouth 2 (two) times daily.  . [DISCONTINUED] Hyoscyamine Sulfate SL (LEVSIN/SL) 0.125 MG SUBL Dissolve 1 tablet on the tongue ,every 8 hours as needed.  . [DISCONTINUED] PREVALITE 4 g packet TAKE 1  PACKET AS DIRECTED DAILY     Allergies:   Actos [pioglitazone]; Sulfonamide derivatives; and Codeine   Social History   Socioeconomic History  . Marital status: Married    Spouse name: Not on file  . Number of children: Not on file  . Years of education: 49  . Highest education level: Not on file  Occupational History  . Occupation: retired    Fish farm manager: RETIRED  Social Needs  . Financial resource strain: Not on file  . Food insecurity:    Worry: Not on file  Inability: Not on file  . Transportation needs:    Medical: Not on file    Non-medical: Not on file  Tobacco Use  . Smoking status: Never Smoker  . Smokeless tobacco: Never Used  Substance and Sexual Activity  . Alcohol use: No    Alcohol/week: 0.0 standard drinks  . Drug use: No  . Sexual activity: Not on file  Lifestyle  . Physical activity:    Days per week: Not on file    Minutes per session: Not on file  . Stress: Not on file  Relationships  . Social connections:    Talks on phone: Not on file    Gets together: Not on file    Attends religious service: Not on file    Active member of club or organization: Not on file    Attends meetings of clubs or organizations: Not on file    Relationship status: Not on file  Other Topics Concern  . Not on file  Social History Narrative   [name], Married. Lives with husband. Had been a very active woman.     Family History: The patient's family history includes Breast cancer in her other; Cholelithiasis in her mother; Colon cancer in her brother; Diabetes in her sister; Heart disease in her mother; Lung cancer in her father and sister; Prostate cancer in her brother; Stomach cancer in her cousin. There is no history of Esophageal cancer. ROS:   Please see the history of present illness.     All other systems reviewed and are negative.  EKGs/Labs/Other Studies Reviewed:    The following studies were reviewed today:  Echocardiogram: 02/2017 Study Conclusions -  Left ventricle: The cavity size was normal. Wall thickness was normal. Systolic function was normal. The estimated ejection fraction was in the range of 55% to 60%. Doppler parameters are consistent with pseudonormal left ventricular relaxation (grade 2 diastolic dysfunction). The E/e&' ratio is between 8-15, suggesting indeterminate LV filling pressure. - Aortic valve: Sclerosis without stenosis. There was mild regurgitation. - Mitral valve: Calcified annulus. Mildly thickened leaflets . There was mild regurgitation. - Left atrium: Moderately dilated. - Right atrium: The atrium was mildly dilated. - Tricuspid valve: There was mild regurgitation. - Inferior vena cava: The vessel was normal in size. The respirophasic diameter changes were in the normal range (= 50%), consistent with normal central venous pressure.  Impressions: - Compared to a prior echo in 2016, there are few changes.   Carotid doppler 12/2017 Final Interpretation: Right Carotid: Velocities in the right ICA are consistent with a 1-39% stenosis.        The RICA velocities remain within normal range and have decreased        compared to the prior exam.  Left Carotid: Velocities in the left ICA are consistent with a 1-39% stenosis.       The ECA appears >50% stenosed. The LICA velocities remain within       normal range is essentially stable compared to the prior exam.  EKG:  EKG of 04/01/2018 is reviewed today, demonstrates a fibrillation at 94 bpm and LBBB (prior IVCD)  Recent Labs: 03/03/2018: BUN 26; Creatinine, Ser 0.95; Magnesium 2.3; Potassium 3.5; Sodium 138 04/05/2018: Hemoglobin 11.4; Platelets 254; TSH 4.390   Recent Lipid Panel    Component Value Date/Time   CHOL 135 11/18/2016   TRIG 54 11/18/2016   TRIG 85 02/05/2006 0818   HDL 69.20 11/22/2014 0919   CHOLHDL 2 11/22/2014 0919   VLDL 10.6 11/22/2014  0919   LDLCALC 66 11/22/2014 0919    Physical  Exam:    VS:  BP 140/60   Pulse 77   Ht 5' 3.5" (1.613 m)   Wt 99 lb 12.8 oz (45.3 kg)   SpO2 99%   BMI 17.40 kg/m     Wt Readings from Last 3 Encounters:  04/05/18 99 lb 12.8 oz (45.3 kg)  04/01/18 96 lb 8 oz (43.8 kg)  03/16/18 102 lb 6.4 oz (46.4 kg)     Physical Exam  Constitutional: She is oriented to person, place, and time. She appears well-developed and well-nourished. No distress.  HENT:  Head: Normocephalic and atraumatic.  Neck: Normal range of motion. Neck supple. No JVD present.  Cardiovascular: Normal rate and regular rhythm.  Murmur heard.  Harsh midsystolic murmur is present with a grade of 2/6 at the upper right sternal border radiating to the neck.  Blowing holosystolic murmur of grade 1/6 is also present at the apex radiating to the axilla. Pulmonary/Chest: Effort normal and breath sounds normal. No respiratory distress. She has no wheezes. She has no rales.  Abdominal: Soft. Bowel sounds are normal.  Musculoskeletal: Normal range of motion. She exhibits no edema, tenderness or deformity.  Neurological: She is alert and oriented to person, place, and time.  Skin: Skin is warm and dry.  Psychiatric: She has a normal mood and affect. Her behavior is normal. Judgment and thought content normal.  Vitals reviewed.   ASSESSMENT:    1. Paroxysmal atrial fibrillation (HCC)   2. Weakness   3. Anemia, unspecified type   4. Chronic diastolic heart failure ([place])   5. Coronary artery disease involving native coronary artery of native heart without angina pectoris   6. Essential (primary) hypertension   7. Aortic valve insufficiency, etiology of cardiac valve disease unspecified    PLAN:    In order of problems listed above:  New onset atrial fibrillation: Patient had a GI illness with nausea and diarrhea that led to probable dehydration and an episode of weakness and loss of consciousness on 03/31/2018.  Found to be in atrial fibrillation with controlled rate at  PCP office visit on 04/01/2018.  Currently back in normal sinus rhythm. -Normal EF, moderately dilated left atria on echo 02/2017 -CHA2DS2/VAS Stroke Risk Score is 5 (Vasc Dz, HTN, Age (4), female).  She was given samples of Eliquis 5 mg twice daily by her PCP.  I will initiate Eliquis 2.5 mg twice daily, reduced dose for age greater than 38 years and body weight less than 60 kg, for stroke risk reduction -Will recheck echocardiogram to assess LV function and valve status. -Patient is still recovering from her GI illness, still with some weakness but no chest discomfort, palpitations, shortness of breath. -Atrial fibrillation may have been related to her recent illness and dehydration.  Continue current beta-blocker.  Will not change any dosing at this time as she is maintaining sinus rhythm. -TSH was normal earlier this year but will recheck again in setting of A. Fib.  Continued weakness -Residual from her recent GI illness. -We will check CBC to evaluate for leukocytosis that could indicate infectious process and assess for anemia   Chronic diastolic CHF -Patient appears euvolemic. -We will check echocardiogram for any changes in LV systolic function that may have led to atrial fibrillation  CAD S/P CABG 2007 -No anginal symptoms.  We will stop aspirin in setting of need for anticoagulation.  Continue statin, beta-blocker.    Hypertension -Blood pressure  is moderately controlled.  No changes at this time.  Aortic insufficiency -Mild AR and MR echo in 02/2017. -Murmur present radiating to the carotids.  Will recheck echocardiogram for valve status.     Medication Adjustments/Labs and Tests Ordered: Current medicines are reviewed at length with the patient today.  Concerns regarding medicines are outlined above. Labs and tests ordered and medication changes are outlined in the patient instructions below:  Patient Instructions  Medication Instructions:  START: Eliquis 2.5 Mg twice a  day  If you need a refill on your cardiac medications before your next appointment, please call your pharmacy.   Lab work: TODAY: CBC, TSH  If you have labs (blood work) drawn today and your tests are completely normal, you will receive your results only by: Marland Kitchen MyChart Message (if you have MyChart) OR . A paper copy in the mail If you have any lab test that is abnormal or we need to change your treatment, we will call you to review the results.  Testing/Procedures: Your physician has requested that you have an echocardiogram. Echocardiography is a painless test that uses sound waves to create images of your heart. It provides your doctor with information about the size and shape of your heart and how well your heart's chambers and valves are working. This procedure takes approximately one hour. There are no restrictions for this procedure.   Follow-Up: At [facility], you and your health needs are our priority.  As part of our continuing mission to provide you with exceptional heart care, we have created designated Provider Care Teams.  These Care Teams include your primary Cardiologist (physician) and Advanced Practice Providers (APPs -  Physician Assistants and Nurse Practitioners) who all work together to provide you with the care you need, when you need it. You will need a follow up appointment in:  3 months.  Please call our office 2 months in advance to schedule this appointment.  You may see Sherren Mocha, MD or one of the following Advanced Practice Providers on your designated Care Team: Richardson Dopp, PA-C [place], [place] . Daune Perch, NP  Any Other Special Instructions Will Be Listed Below (If Applicable).       Signed, Daune Perch, NP  04/05/2018 7:12 PM    [facility]

## 2018-04-05 NOTE — Patient Instructions (Signed)
Medication Instructions:  START: Eliquis 2.5 Mg twice a day  If you need a refill on your cardiac medications before your next appointment, please call your pharmacy.   Lab work: TODAY: CBC, TSH  If you have labs (blood work) drawn today and your tests are completely normal, you will receive your results only by: Marland Kitchen MyChart Message (if you have MyChart) OR . A paper copy in the mail If you have any lab test that is abnormal or we need to change your treatment, we will call you to review the results.  Testing/Procedures: Your physician has requested that you have an echocardiogram. Echocardiography is a painless test that uses sound waves to create images of your heart. It provides your doctor with information about the size and shape of your heart and how well your heart's chambers and valves are working. This procedure takes approximately one hour. There are no restrictions for this procedure.   Follow-Up: At [facility], you and your health needs are our priority.  As part of our continuing mission to provide you with exceptional heart care, we have created designated Provider Care Teams.  These Care Teams include your primary Cardiologist (physician) and Advanced Practice Providers (APPs -  Physician Assistants and Nurse Practitioners) who all work together to provide you with the care you need, when you need it. You will need a follow up appointment in:  3 months.  Please call our office 2 months in advance to schedule this appointment.  You may see Sherren Mocha, MD or one of the following Advanced Practice Providers on your designated Care Team: Richardson Dopp, PA-C [place], [place] . Daune Perch, NP  Any Other Special Instructions Will Be Listed Below (If Applicable).

## 2018-04-06 ENCOUNTER — Telehealth: Payer: Self-pay | Admitting: *Deleted

## 2018-04-06 ENCOUNTER — Other Ambulatory Visit: Payer: Self-pay

## 2018-04-06 ENCOUNTER — Telehealth: Payer: Self-pay

## 2018-04-06 MED ORDER — APIXABAN 2.5 MG PO TABS: 2.5000 mg | ORAL_TABLET | Freq: Two times a day (BID) | ORAL | 11 refills | Status: DC

## 2018-04-06 MED ORDER — APIXABAN 2.5 MG PO TABS: 2.5000 mg | ORAL_TABLET | Freq: Two times a day (BID) | ORAL | 0 refills | Status: DC

## 2018-04-06 NOTE — Telephone Encounter (Signed)
Eliquis 2.5mg  refill request received; pt is 84 yrs old, wt-45.3kg, Crea-0.95 on 03/03/18, last seen by Daune Perch on 04/05/18; will send in refill to requested pharmacy.

## 2018-04-06 NOTE — Telephone Encounter (Addendum)
I called the pts pharmacy and was advised that a 30 day supply of Eliquis 2.5 mg will cost the pt $45 and that it does not require a PA.  I called the pt and advised her that a new RX for her Eliquis was sent to her pharmacy today for Eliquis 2.5 mg take 1 tablet twice a day.  She is aware of the cost and states that she can afford it for now but if she gets to a point where she can no longer afford she will call us to let us know.

## 2018-04-06 NOTE — Telephone Encounter (Signed)
Error

## 2018-04-06 NOTE — Telephone Encounter (Signed)
I made a mistake and sent prescription to pharmacy instead of you. Can you please advise refills .

## 2018-04-06 NOTE — Telephone Encounter (Signed)
Patient calling about Eliquis refill and samples. She says she has not heard anything recent on whether they approved or denied her refill of Eliquis.  She states she will run out of Eliquis soon and will be going out of town tomorrow for a family emergency.  Would like to know what to do or what is going on with her getting the refill.

## 2018-04-12 ENCOUNTER — Other Ambulatory Visit: Payer: Self-pay

## 2018-04-12 ENCOUNTER — Ambulatory Visit (HOSPITAL_COMMUNITY): Payer: Medicare Other | Attending: Cardiology

## 2018-04-12 DIAGNOSIS — I480 Paroxysmal atrial fibrillation: Secondary | ICD-10-CM | POA: Insufficient documentation

## 2018-04-19 ENCOUNTER — Telehealth: Payer: Self-pay

## 2018-04-19 DIAGNOSIS — I10 Essential (primary) hypertension: Secondary | ICD-10-CM

## 2018-04-19 MED ORDER — FUROSEMIDE 20 MG PO TABS: 20.0000 mg | ORAL_TABLET | Freq: Every day | ORAL | 3 refills | Status: DC

## 2018-04-19 NOTE — Telephone Encounter (Addendum)
Per Pecolia Ades NP, Echo shows good pumping function but worse valve function. Also appears that she has some volume overload. Start a low dose of lasix 20 mg daily to try and remove fluid. Check BMet in a week. Dr. Burt Knack says he will try to fit her in to see him soon. I will send a message to Sammuel Bailiff to call pt to arrange.   Spoke with pt she is aware of results. She is okay with starting low dose lasix 20 mg daily. Pt will come to the office on 04/26/18 for BMET

## 2018-04-26 ENCOUNTER — Other Ambulatory Visit: Payer: Self-pay | Admitting: Family Medicine

## 2018-04-26 ENCOUNTER — Other Ambulatory Visit: Payer: Medicare Other

## 2018-04-27 ENCOUNTER — Other Ambulatory Visit: Payer: Medicare Other | Admitting: *Deleted

## 2018-04-27 DIAGNOSIS — I10 Essential (primary) hypertension: Secondary | ICD-10-CM | POA: Diagnosis not present

## 2018-04-28 LAB — BASIC METABOLIC PANEL
BUN/Creatinine Ratio: 23 (ref 12–28)
BUN: 27 mg/dL (ref 8–27)
CO2: 23 mmol/L (ref 20–29)
Calcium: 9 mg/dL (ref 8.7–10.3)
Chloride: 103 mmol/L (ref 96–106)
Creatinine, Ser: 1.2 mg/dL — ABNORMAL HIGH (ref 0.57–1.00)
GFR calc Af Amer: 48 mL/min/{1.73_m2} — ABNORMAL LOW (ref >59)
GFR calc non Af Amer: 42 mL/min/{1.73_m2} — ABNORMAL LOW (ref >59)
Glucose: 171 mg/dL — ABNORMAL HIGH (ref 65–99)
Potassium: 4.1 mmol/L (ref 3.5–5.2)
Sodium: 141 mmol/L (ref 134–144)

## 2018-04-29 ENCOUNTER — Telehealth: Payer: Self-pay

## 2018-04-29 NOTE — Telephone Encounter (Signed)
-----   Message from Daune Perch, NP sent at 04/14/2018  4:28 PM EST ----- I got an echo on this pt and reviewed results with Dr. Burt Knack. He would like to try to get her worked in to see him for further evaluation. Can you see what you can do and call pt to schedule.  Thanks, Pecolia Ades, NP

## 2018-04-29 NOTE — Telephone Encounter (Signed)
Attempted to call and schedule patient for appointment. After receiving a busy signal several times, left message to call back on cell phone.  Will schedule patient 1/17 at 0900.

## 2018-04-30 ENCOUNTER — Telehealth: Payer: Self-pay

## 2018-04-30 NOTE — Telephone Encounter (Signed)
Spoke with pt in regards to lab results. I went ahead and scheduled her to see Dr. Burt Knack on 05/14/18

## 2018-04-30 NOTE — Telephone Encounter (Signed)
Per Pecolia Ades NP for pt to stop lasix and continue Spironolactone. Pt verbalized understanding and is scheduled to see Dr. Burt Knack on 05/14/18

## 2018-05-10 DIAGNOSIS — M5416 Radiculopathy, lumbar region: Secondary | ICD-10-CM | POA: Diagnosis not present

## 2018-05-11 ENCOUNTER — Ambulatory Visit: Payer: Medicare Other | Admitting: Physician Assistant

## 2018-05-14 ENCOUNTER — Encounter: Payer: Self-pay | Admitting: Cardiovascular Disease

## 2018-05-14 ENCOUNTER — Ambulatory Visit: Payer: Medicare Other | Admitting: Cardiovascular Disease

## 2018-05-14 VITALS — BP 140/60 | HR 60 | Ht 63.5 in | Wt 101.0 lb

## 2018-05-14 DIAGNOSIS — I5032 Chronic diastolic (congestive) heart failure: Secondary | ICD-10-CM | POA: Diagnosis not present

## 2018-05-14 DIAGNOSIS — I361 Nonrheumatic tricuspid (valve) insufficiency: Secondary | ICD-10-CM

## 2018-05-14 DIAGNOSIS — I480 Paroxysmal atrial fibrillation: Secondary | ICD-10-CM

## 2018-05-14 DIAGNOSIS — I2510 Atherosclerotic heart disease of native coronary artery without angina pectoris: Secondary | ICD-10-CM

## 2018-05-14 MED ORDER — APIXABAN 2.5 MG PO TABS: 2.5000 mg | ORAL_TABLET | Freq: Two times a day (BID) | ORAL | 11 refills | Status: DC

## 2018-05-14 NOTE — Progress Notes (Signed)
Cardiology Office Note:    Date:  05/14/2018   ID:  [name], DOB 1933/01/12, MRN [id number]  PCP:  [name], MD  Cardiologist:  Sherren Mocha, MD  Electrophysiologist:  None   Referring MD: [name], MD   Chief Complaint  Patient presents with  . Follow-up    Valvular heart disease    History of Present Illness:    [name] is a 85 y.o. female with a hx of coronary artery disease status post CABG in 2007, moderate mitral regurgitation, hypertension, pernicious anemia, and asymptomatic carotid stenosis.  She was recently diagnosed with atrial fibrillation in December 2019.  Her ventricular rate was controlled and she was started on apixaban for anticoagulation.  This occurred in the context of a GI illness.  When she was seen in the cardiology office April 05, 2018, she was back in sinus rhythm.  An echocardiogram demonstrated normal LV systolic function and worsening of tricuspid regurgitation now greater than the moderate to severe range.  The patient is here with her husband today.  She has been under a lot of stress lately as her brother passed away in [place] and is she has been dealing with his estate.  She has been traveling back and forth to [place] almost every week.  At the time of her last visit, she was started on low-dose furosemide 20 mg daily.  She did not tolerate this very well and had fairly marked weakness while taking it for a few weeks.  She has subsequently discontinued furosemide and is feeling better.  She has intermittent shortness of breath when she first lies down to bed at night.  She otherwise has had no PND or exertional dyspnea recently.  She denies any chest pain or pressure.  She has mild edema of the right ankle that has been present for many years since undergoing saphenous vein graft harvesting for CABG.  No other specific cardiac-related complaints today.  Past Medical History:  Diagnosis Date  . Anemia,  pernicious   . Arthritis    bursitis   . Atrophic gastritis   . B12 deficiency   . CAD (coronary artery disease)   . Carotid artery stenosis   . Diarrhea    on and off for months  . Family history of adverse reaction to anesthesia    extreme nausea brother and sister  . Fibromyalgia   . Gallbladder polyp   . GERD (gastroesophageal reflux disease)   . Heart murmur   . History of hiatal hernia   . History of kidney stones   . HLD (hyperlipidemia)   . HTN (hypertension)   . Hypothyroidism   . Irritable bowel syndrome (IBS)   . Median arcuate ligament syndrome ([place])   . PVD (peripheral vascular disease) ([place])   . Tubular adenoma of colon 08/2013   with focal high grade dysplasia    Past Surgical History:  Procedure Laterality Date  . ABDOMINAL HYSTERECTOMY    . APPENDECTOMY    . BIOPSY THYROID    . CHOLECYSTECTOMY N/A 10/24/2014   Procedure: MICROSCOPIC  CHOLECYSTECTOMY;  Surgeon: Ralene Ok, MD;  Location: [facility];  Service: General;  Laterality: N/A;  . CORONARY ARTERY BYPASS GRAFT  07  . ESOPHAGEAL MANOMETRY N/A 08/02/2012   Procedure: ESOPHAGEAL MANOMETRY (EM);  Surgeon: Sable Feil, MD;  Location: [facility];  Service: Endoscopy;  Laterality: N/A;  . THYROID LOBECTOMY      Current Medications: Current Meds  Medication Sig  .  acetaminophen (TYLENOL) 500 MG tablet Take 500 mg by mouth every 6 (six) hours as needed (pain).  Marland Kitchen amLODipine (NORVASC) 10 MG tablet TAKE 1 TABLET BY MOUTH ONCE DAILY  . apixaban (ELIQUIS) 2.5 MG TABS tablet Take 1 tablet (2.5 mg total) by mouth 2 (two) times daily.  Marland Kitchen atorvastatin (LIPITOR) 80 MG tablet TAKE 1 TABLET BY MOUTH AT BEDTIME  . azelastine (ASTELIN) 0.1 % nasal spray Place 1 spray into both nostrils 2 (two) times daily as needed for rhinitis or allergies. Use in each nostril as directed  . Calcium Carbonate-Vitamin D (CALTRATE 600+D PO) Take 600 mg by mouth daily with lunch.  Marland Kitchen CARAFATE 1 GM/10ML suspension TAKE 10 MLS (1 G  TOTAL) BY MOUTH 2 (TWO) TIMES DAILY.  . carvedilol (COREG) 6.25 MG tablet TAKE 1/2 TABLET BY MOUTH TWICE DAILY  . cyanocobalamin (,VITAMIN B-12,) 1000 MCG/ML injection Inject 1,000 mcg into the muscle every 30 (thirty) days. On or about the end of each month  . Cyanocobalamin (B-12) 1000 MCG/ML KIT cyanocobalamin (vit B-12) 1,000 mcg/mL injection kit  Inject 1 mL every month by intramuscular route.  . fluticasone (FLONASE) 50 MCG/ACT nasal spray Place 1 spray daily as needed into both nostrils for allergies or rhinitis.  . hydrALAZINE (APRESOLINE) 50 MG tablet TAKE 1 TABLET BY MOUTH 3 TIMES DAILY  . levothyroxine (SYNTHROID, LEVOTHROID) 50 MCG tablet Take 50 mcg by mouth daily before breakfast.  . losartan (COZAAR) 100 MG tablet TAKE 1 TABLET BY MOUTH EVERY DAY  . Multiple Vitamins-Minerals (OCUVITE-LUTEIN PO) PreserVision AREDS 2  . sodium chloride (OCEAN) 0.65 % nasal spray Place 1 spray into both nostrils 3 (three) times daily as needed for congestion.   Marland Kitchen spironolactone (ALDACTONE) 25 MG tablet TAKE 1 TABLET BY MOUTH ONCE DAILY  . zoledronic acid (RECLAST) 5 MG/100ML SOLN injection Reclast  . [DISCONTINUED] apixaban (ELIQUIS) 2.5 MG TABS tablet Take 1 tablet (2.5 mg total) by mouth 2 (two) times daily.  . [DISCONTINUED] aspirin EC 81 MG tablet Take 81 mg by mouth at bedtime.     Allergies:   Actos [pioglitazone]; Sulfonamide derivatives; and Codeine   Social History   Socioeconomic History  . Marital status: Married    Spouse name: Not on file  . Number of children: Not on file  . Years of education: 47  . Highest education level: Not on file  Occupational History  . Occupation: retired    Fish farm manager: RETIRED  Social Needs  . Financial resource strain: Not on file  . Food insecurity:    Worry: Not on file    Inability: Not on file  . Transportation needs:    Medical: Not on file    Non-medical: Not on file  Tobacco Use  . Smoking status: Never Smoker  . Smokeless tobacco:  Never Used  Substance and Sexual Activity  . Alcohol use: No    Alcohol/week: 0.0 standard drinks  . Drug use: No  . Sexual activity: Not on file  Lifestyle  . Physical activity:    Days per week: Not on file    Minutes per session: Not on file  . Stress: Not on file  Relationships  . Social connections:    Talks on phone: Not on file    Gets together: Not on file    Attends religious service: Not on file    Active member of club or organization: Not on file    Attends meetings of clubs or organizations: Not on file  Relationship status: Not on file  Other Topics Concern  . Not on file  Social History Narrative   [name], Married. Lives with husband. Had been a very active woman.     Family History: The patient's family history includes Breast cancer in an other family member; Cholelithiasis in her mother; Colon cancer in her brother; Diabetes in her sister; Heart disease in her mother; Lung cancer in her father and sister; Prostate cancer in her brother; Stomach cancer in her cousin. There is no history of Esophageal cancer.  ROS:   Please see the history of present illness.    Positive for diarrhea.  All other systems reviewed and are negative.  EKGs/Labs/Other Studies Reviewed:    The following studies were reviewed today: Echo: Study Conclusions  - Left ventricle: The cavity size was normal. Systolic function was   normal. The estimated ejection fraction was in the range of 55%   to 60%. Wall motion was normal; there were no regional wall   motion abnormalities. Features are consistent with a pseudonormal   left ventricular filling pattern, with concomitant abnormal   relaxation and increased filling pressure (grade 2 diastolic   dysfunction). Doppler parameters are consistent with elevated   ventricular end-diastolic filling pressure. - Aortic valve: There was moderate regurgitation. - Mitral valve: There was moderate regurgitation. - Left atrium: The atrium was  moderately dilated. - Right ventricle: The cavity size was normal. Wall thickness was   normal. Systolic function was normal. - Right atrium: The atrium was mildly dilated. - Tricuspid valve: There was moderate regurgitation. - Pulmonary arteries: Systolic pressure was moderately increased.   PA peak pressure: 63 mm Hg (S). - Pericardium, extracardiac: There was no pericardial effusion.  Impressions:  - When compared to the prior study from 03/26/2017, tricuspid   regurgitation is now severe, there is new moderate pulmonary   hypertension.  Left ventricle:  The cavity size was normal. Systolic function was normal. The estimated ejection fraction was in the range of 55% to 60%. Wall motion was normal; there were no regional wall motion abnormalities. Features are consistent with a pseudonormal left ventricular filling pattern, with concomitant abnormal relaxation and increased filling pressure (grade 2 diastolic dysfunction). Doppler parameters are consistent with elevated ventricular end-diastolic filling pressure.  ------------------------------------------------------------------- Aortic valve:   Trileaflet; normal thickness leaflets. Mobility was not restricted.  Doppler:  Transvalvular velocity was within the normal range. There was no stenosis. There was moderate regurgitation.  ------------------------------------------------------------------- Aorta:  Aortic root: The aortic root was normal in size.  ------------------------------------------------------------------- Mitral valve:   Structurally normal valve.   Mobility was not restricted.  Doppler:  Transvalvular velocity was within the normal range. There was no evidence for stenosis. There was moderate regurgitation.    Peak gradient (D): 10 mm Hg.  ------------------------------------------------------------------- Left atrium:  The atrium was moderately  dilated.  ------------------------------------------------------------------- Right ventricle:  The cavity size was normal. Wall thickness was normal. Systolic function was normal.  ------------------------------------------------------------------- Pulmonic valve:    Structurally normal valve.   Cusp separation was normal.  Doppler:  Transvalvular velocity was within the normal range. There was no evidence for stenosis. There was no regurgitation.  ------------------------------------------------------------------- Tricuspid valve:   Structurally normal valve.    Doppler: Transvalvular velocity was within the normal range. There was moderate regurgitation.  ------------------------------------------------------------------- Pulmonary artery:   The main pulmonary artery was normal-sized. Systolic pressure was moderately increased.  ------------------------------------------------------------------- Right atrium:  The atrium was mildly dilated.  ------------------------------------------------------------------- Pericardium:  There  was no pericardial effusion.  ------------------------------------------------------------------- Systemic veins: Inferior vena cava: The vessel was dilated. The respirophasic diameter changes were blunted (< 50%), consistent with elevated central venous pressure.  EKG:  EKG is not ordered today.    Recent Labs: 03/03/2018: Magnesium 2.3 04/05/2018: Hemoglobin 11.4; Platelets 254; TSH 4.390 04/27/2018: BUN 27; Creatinine, Ser 1.20; Potassium 4.1; Sodium 141  Recent Lipid Panel    Component Value Date/Time   CHOL 135 11/18/2016   TRIG 54 11/18/2016   TRIG 85 02/05/2006 0818   HDL 69.20 11/22/2014 0919   CHOLHDL 2 11/22/2014 0919   VLDL 10.6 11/22/2014 0919   LDLCALC 66 11/22/2014 0919    Physical Exam:    VS:  BP 140/60   Pulse 60   Ht 5' 3.5" (1.613 m)   Wt 101 lb (45.8 kg)   SpO2 99%   BMI 17.61 kg/m     Wt Readings from  Last 3 Encounters:  05/14/18 101 lb (45.8 kg)  04/05/18 99 lb 12.8 oz (45.3 kg)  04/01/18 96 lb 8 oz (43.8 kg)     GEN: Thin, pleasant elderly woman, in no acute distress HEENT: Normal NECK: No JVD; No carotid bruits LYMPHATICS: No lymphadenopathy CARDIAC: Regular rate except for occasional premature beats, 2/6 systolic murmur at the left lower sternal border, 1/6 diastolic murmur at the left lower sternal border RESPIRATORY:  Clear to auscultation without rales, wheezing or rhonchi  ABDOMEN: Soft, non-tender, non-distended MUSCULOSKELETAL: Trace right ankle edema; No deformity  SKIN: Warm and dry NEUROLOGIC:  Alert and oriented x 3 PSYCHIATRIC:  Normal affect   ASSESSMENT:    1. Chronic diastolic heart failure ([place])   2. Nonrheumatic tricuspid valve regurgitation   3. Coronary artery disease involving native coronary artery of native heart without angina pectoris   4. Paroxysmal atrial fibrillation ([facility])    PLAN:    In order of problems listed above:  1. The patient has improved with respect to her diastolic heart failure and she is not currently limited by dyspnea.  We will continue her current medical program.  I advised her to keep furosemide available in case she needs it, but she will no longer take it as a regular medication as she cannot tolerate it well.  Blood pressure appears to be well controlled on current therapy. 2. I personally reviewed the patient's echo study.  She has mixed valvular disease with moderate aortic valve insufficiency, 2+ mitral regurgitation, and moderately severe tricuspid regurgitation.  I think she can be managed with medical therapy and ongoing surveillance.  She does not appear to have any severe valvular disease with an indication for intervention at this time. 3. The patient is stable on current treatment with no anginal symptoms. 4. Clinically in sinus rhythm today which was also demonstrated on her most recent echo.  She is now on apixaban 2.5  mg twice daily.  I would like her to discontinue aspirin to reduce her bleeding risk.   Medication Adjustments/Labs and Tests Ordered: Current medicines are reviewed at length with the patient today.  Concerns regarding medicines are outlined above.  Orders Placed This Encounter  Procedures  . Basic metabolic panel  . Pro b natriuretic peptide (BNP)   Meds ordered this encounter  Medications  . apixaban (ELIQUIS) 2.5 MG TABS tablet    Sig: Take 1 tablet (2.5 mg total) by mouth 2 (two) times daily.    Dispense:  60 tablet    Refill:  11    For updated  Rx. The patient reported the pharmacy did not have it on file. Thank you!    Patient Instructions  Medication Instructions:  1) STOP ASPIRIN  Labwork: Please have lab work drawn at [facility] 2 or so days prior to your next visit in March.  Testing/Procedures: None  Follow-Up: You have an appointment scheduled with Richardson Dopp, PA on 06/30/2018 at 1:45PM.      Signed, Sherren Mocha, MD  05/14/2018 11:02 AM    [place]

## 2018-05-14 NOTE — Patient Instructions (Addendum)
Medication Instructions:  1) STOP ASPIRIN  Labwork: Please have lab work drawn at [facility] 2 or so days prior to your next visit in March.  Testing/Procedures: None  Follow-Up: You have an appointment scheduled with Richardson Dopp, PA on 06/30/2018 at 1:45PM.

## 2018-05-22 ENCOUNTER — Other Ambulatory Visit: Payer: Self-pay | Admitting: Family Medicine

## 2018-05-24 ENCOUNTER — Ambulatory Visit (INDEPENDENT_AMBULATORY_CARE_PROVIDER_SITE_OTHER): Payer: Medicare Other | Admitting: *Deleted

## 2018-05-24 DIAGNOSIS — E538 Deficiency of other specified B group vitamins: Secondary | ICD-10-CM | POA: Diagnosis not present

## 2018-05-24 MED ORDER — CYANOCOBALAMIN 1000 MCG/ML IJ SOLN
1000.0000 ug | Freq: Once | INTRAMUSCULAR | Status: AC
Start: 2018-05-24 — End: 2018-05-24
  Administered 2018-05-24: 1000 ug via INTRAMUSCULAR

## 2018-05-24 NOTE — Progress Notes (Signed)
Per orders of Dr. Burchette, injection of Vit B12 given by GREEN, ASHTYN M. Patient tolerated injection well.  

## 2018-05-27 DIAGNOSIS — R7303 Prediabetes: Secondary | ICD-10-CM | POA: Diagnosis not present

## 2018-05-27 DIAGNOSIS — M810 Age-related osteoporosis without current pathological fracture: Secondary | ICD-10-CM | POA: Diagnosis not present

## 2018-05-27 DIAGNOSIS — Z8639 Personal history of other endocrine, nutritional and metabolic disease: Secondary | ICD-10-CM | POA: Diagnosis not present

## 2018-05-27 DIAGNOSIS — R7309 Other abnormal glucose: Secondary | ICD-10-CM | POA: Diagnosis not present

## 2018-05-31 ENCOUNTER — Other Ambulatory Visit: Payer: Self-pay | Admitting: Family Medicine

## 2018-06-16 DIAGNOSIS — M5416 Radiculopathy, lumbar region: Secondary | ICD-10-CM | POA: Diagnosis not present

## 2018-06-17 ENCOUNTER — Telehealth: Payer: Self-pay | Admitting: *Deleted

## 2018-06-17 NOTE — Telephone Encounter (Signed)
Patient with diagnosis of afib on eliquis for anticoagulation.    Procedure: L4-5 LUMBAR LAMINECTOMY  Date of procedure: TBD  CHADS2-VASc score of  6 (CHF, HTN, AGE, DM2, stroke/tia x 2, CAD, AGE, female)  Per office protocol, patient can hold Eliquis for 3 days prior to procedure.

## 2018-06-17 NOTE — Telephone Encounter (Signed)
   Primary [place], MD  Chart reviewed as part of pre-operative protocol coverage. Because of [name]'s past medical history and time since last visit, he/she will require a follow-up visit in order to better assess preoperative cardiovascular risk. Pt was just seen by Dr. Burt Knack 04/2018 and he advised close f/u w/ APP given issues documented. She is scheduled 06/30/18 with Richardson Dopp, PA-C. He will reassess at that time and will determine if pt can be cleared for the procedure based on her status.   If applicable, this message will also be routed to pharmacy pool and/or primary cardiologist for input on holding anticoagulant/antiplatelet agent as requested below so that this information is available at time of patient's appointment. Pt is on Eliquis.   Lyda Jester, PA-C  06/17/2018, 9:35 AM

## 2018-06-17 NOTE — Telephone Encounter (Signed)
   [facility] Pre-operative Risk Assessment    Request for surgical clearance:  1. What type of surgery is being performed? L4-5 LUMBAR LAMINECTOMY   2. When is this surgery scheduled? TBD   3. What type of clearance is required (medical clearance vs. Pharmacy clearance to hold med vs. Both)? BOTH  4. Are there any medications that need to be held prior to surgery and how long?ELIQUIS   5. Practice name and name of physician performing surgery? [facility]; DR. Mallie Mussel POOL   6. What is your office phone number [phone]    7.   What is your office fax number [phone]  8.   Anesthesia type (None, local, MAC, general) ? GENERAL   [name] 06/17/2018, 9:12 AM  _________________________________________________________________   (provider comments below)

## 2018-06-18 ENCOUNTER — Encounter (HOSPITAL_COMMUNITY): Payer: Medicare Other

## 2018-06-22 ENCOUNTER — Encounter (HOSPITAL_COMMUNITY): Payer: Self-pay

## 2018-06-22 ENCOUNTER — Ambulatory Visit (HOSPITAL_COMMUNITY)
Admission: RE | Admit: 2018-06-22 | Discharge: 2018-06-22 | Disposition: A | Discharge status: Home / Self Care | Payer: Medicare Other | Source: Ambulatory Visit | Attending: Internal Medicine | Admitting: Internal Medicine

## 2018-06-22 DIAGNOSIS — M810 Age-related osteoporosis without current pathological fracture: Secondary | ICD-10-CM | POA: Diagnosis not present

## 2018-06-22 MED ORDER — SODIUM CHLORIDE 0.9 % IV SOLN
INTRAVENOUS | Status: AC
  Administered 2018-06-22: 13:00:00 via INTRAVENOUS

## 2018-06-22 MED ORDER — ZOLEDRONIC ACID 5 MG/100ML IV SOLN
5.0000 mg | Freq: Once | INTRAVENOUS | Status: AC
  Administered 2018-06-22: 5 mg via INTRAVENOUS
  Filled 2018-06-22: qty 100

## 2018-06-22 NOTE — Discharge Instructions (Signed)

## 2018-06-25 DIAGNOSIS — I5032 Chronic diastolic (congestive) heart failure: Secondary | ICD-10-CM | POA: Diagnosis not present

## 2018-06-25 LAB — BASIC METABOLIC PANEL
BUN/Creatinine Ratio: 21 (ref 12–28)
BUN: 19 mg/dL (ref 8–27)
CO2: 22 mmol/L (ref 20–29)
Calcium: 9.1 mg/dL (ref 8.7–10.3)
Chloride: 106 mmol/L (ref 96–106)
Creatinine, Ser: 0.9 mg/dL (ref 0.57–1.00)
GFR calc Af Amer: 68 mL/min/{1.73_m2} (ref >59)
GFR calc non Af Amer: 59 mL/min/{1.73_m2} — ABNORMAL LOW (ref >59)
Glucose: 82 mg/dL (ref 65–99)
Potassium: 3.8 mmol/L (ref 3.5–5.2)
Sodium: 140 mmol/L (ref 134–144)

## 2018-06-26 LAB — PRO B NATRIURETIC PEPTIDE: NT-Pro BNP: 459 pg/mL (ref 0–738)

## 2018-06-29 ENCOUNTER — Other Ambulatory Visit: Payer: Self-pay

## 2018-06-29 ENCOUNTER — Ambulatory Visit (INDEPENDENT_AMBULATORY_CARE_PROVIDER_SITE_OTHER): Payer: Medicare Other | Admitting: Family Medicine

## 2018-06-29 ENCOUNTER — Encounter: Payer: Self-pay | Admitting: Family Medicine

## 2018-06-29 VITALS — BP 148/60 | HR 64 | Temp 98.6°F | Ht 63.5 in | Wt 101.3 lb

## 2018-06-29 DIAGNOSIS — I4819 Other persistent atrial fibrillation: Secondary | ICD-10-CM

## 2018-06-29 DIAGNOSIS — E538 Deficiency of other specified B group vitamins: Secondary | ICD-10-CM | POA: Diagnosis not present

## 2018-06-29 DIAGNOSIS — I10 Essential (primary) hypertension: Secondary | ICD-10-CM

## 2018-06-29 DIAGNOSIS — I739 Peripheral vascular disease, unspecified: Secondary | ICD-10-CM | POA: Diagnosis not present

## 2018-06-29 LAB — VITAMIN B12: Vitamin B-12: >1525 pg/mL — ABNORMAL HIGH (ref 211–911)

## 2018-06-29 MED ORDER — APIXABAN 2.5 MG PO TABS: 2.5000 mg | ORAL_TABLET | Freq: Two times a day (BID) | ORAL | 3 refills | Status: DC

## 2018-06-29 MED ORDER — CYANOCOBALAMIN 1000 MCG/ML IJ SOLN
1000.0000 ug | Freq: Once | INTRAMUSCULAR | Status: AC
Start: 2018-06-29 — End: 2018-06-29
  Administered 2018-06-29: 1000 ug via INTRAMUSCULAR

## 2018-06-29 NOTE — Progress Notes (Signed)
Subjective:     Patient ID: [name], female   DOB: 10/07/1932, 85 y.o.   MRN: [id number]  HPI Patient has several issues to address today as follows  History of B12 deficiency.  She has not had B12 level in a couple years.  She recently missed a couple of doses of B12.  She gets monthly injections but was taking care of her brother who just recently passed away.  She would like to get follow-up levels today and get back on track with her monthly injections.  She has history of intrinsic factor deficiency and has not absorbed well orally in the past.  She has history of atrial fibrillation on chronic Eliquis 2.5 mg twice daily.  Needs refills.  No recent bleeding complications.  She had recent labs including pro BNP level and BNP level per cardiology and these were normal.  She has had some ongoing back difficulties and may be looking at surgery soon for that if approved by cardiology  She has hypertension and is on multidrug regimen for that.  Compliant with medications.  She has some occasional lightheadedness but no recent syncope.  Past Medical History:  Diagnosis Date  . Anemia, pernicious   . Arthritis    bursitis   . Atrophic gastritis   . B12 deficiency   . CAD (coronary artery disease)   . Carotid artery stenosis   . Diarrhea    on and off for months  . Family history of adverse reaction to anesthesia    extreme nausea brother and sister  . Fibromyalgia   . Gallbladder polyp   . GERD (gastroesophageal reflux disease)   . Heart murmur   . History of hiatal hernia   . History of kidney stones   . HLD (hyperlipidemia)   . HTN (hypertension)   . Hypothyroidism   . Irritable bowel syndrome (IBS)   . Median arcuate ligament syndrome ([place])   . PVD (peripheral vascular disease) ([place])   . Tubular adenoma of colon 08/2013   with focal high grade dysplasia   Past Surgical History:  Procedure Laterality Date  . ABDOMINAL HYSTERECTOMY    . APPENDECTOMY    . BIOPSY  THYROID    . CHOLECYSTECTOMY N/A 10/24/2014   Procedure: MICROSCOPIC  CHOLECYSTECTOMY;  Surgeon: Ralene Ok, MD;  Location: [facility];  Service: General;  Laterality: N/A;  . CORONARY ARTERY BYPASS GRAFT  07  . ESOPHAGEAL MANOMETRY N/A 08/02/2012   Procedure: ESOPHAGEAL MANOMETRY (EM);  Surgeon: Sable Feil, MD;  Location: [facility];  Service: Endoscopy;  Laterality: N/A;  . THYROID LOBECTOMY      reports that she has never smoked. She has never used smokeless tobacco. She reports that she does not drink alcohol or use drugs. family history includes Breast cancer in an other family member; Cholelithiasis in her mother; Colon cancer in her brother; Diabetes in her sister; Heart disease in her mother; Lung cancer in her father and sister; Prostate cancer in her brother; Stomach cancer in her cousin. Allergies  Allergen Reactions  . Actos [Pioglitazone] Swelling    Swelling  . Sulfonamide Derivatives Swelling  . Codeine Nausea And Vomiting      Review of Systems  Constitutional: Positive for fatigue. Negative for appetite change, chills, fever and unexpected weight change.  Eyes: Negative for visual disturbance.  Respiratory: Negative for cough, chest tightness, shortness of breath and wheezing.   Cardiovascular: Negative for chest pain, palpitations and leg swelling.  Neurological: Negative for dizziness,  seizures, syncope, weakness, light-headedness and headaches.       Objective:   Physical Exam Constitutional:      Appearance: She is well-developed.  Eyes:     Pupils: Pupils are equal, round, and reactive to light.  Neck:     Musculoskeletal: Neck supple.     Thyroid: No thyromegaly.     Vascular: No JVD.  Cardiovascular:     Rate and Rhythm: Normal rate.     Heart sounds: Murmur present. No gallop.   Pulmonary:     Effort: Pulmonary effort is normal. No respiratory distress.     Breath sounds: Normal breath sounds. No wheezing or rales.  Neurological:     Mental  Status: She is alert.        Assessment:     #1 hypertension.  She has isolated systolic hypertension.  No orthostatic changes on exam today  #2 chronic atrial fibrillation treated with Eliquis  #3 B12 deficiency    Plan:     -Recheck B12 level -B12 injection given and continue with monthly B12 .  Hx of IF deficiency. -Refill Eliquis for 1 year -Continue current blood pressure medications. -Routine follow-up in 6 months and sooner as needed  [name] MD [facility]

## 2018-06-29 NOTE — Progress Notes (Signed)
Cardiology Office Note   Date:  06/30/2018   ID:  [name], DOB 05/28/1932, MRN [id number]  PCP:  Sally Post, MD  Cardiologist:  Dr. Sherren Mocha, MD   Chief Complaint  Patient presents with  . Follow-up  . Pre-op Exam   History of Present Illness: [name] is a 85 y.o. female who presents for follow up of CAD, moderate to severe mitral regurgitation, HTN and pre-operative clearance, seen for [name].   [name] has a prior hx coronary artery disease status Valdez CABG in 2007, moderate mitral regurgitation, hypertension, pernicious anemia, and asymptomatic carotid stenosis. She was recently diagnosed with atrial fibrillation in 03/2018. Her ventricular rate was controlled and she was started on apixaban for anticoagulation. This occurred in the context of a GI illness.  When she was seen in the cardiology office 04/05/2018, she was back in sinus rhythm.  An echocardiogram demonstrated normal LV systolic function and worsening of tricuspid regurgitation now greater than the moderate to severe range.  At last office visit, she was under a lot of stress after her brother passed away and was dealing with his estate and had been traveling back and forth to and from [place] every week. She was started on low dose Lasix 81m daily and was noted to have not tolerated that well due to marked weakness. It was discontinued and she was feeling better. She was noted to have some SOB when first lying flat at night but otherwise had no PND or exertional dyspnea. She denied chest pain or pressure. She had mild LE edema which had be Valdez and present since CABG.  Additionally, pt is here for pre-operative clearance for L4-5 lumbar laminectomy with [name] [facility] is asking about holding Eliquis. Pt denies anginal symptoms. She is feeling well. Less stressed than the last time she was seen here in the office. She is to call Dr. PMarchelle Folksoffice once she is ready for her surgery.  She continues to deal with estate issues after her brothers death. Once this settles down, she will proceed with surgery. She reports that she has already met with the pre-surgical team and had an appointment with her PCP just yesterday to check on her anemia. Denies SOB, exertional chest pain, palpitations, orthopnea symptoms, no LE swelling, dizziness or syncope. No falls. She continues to be fully functional in her daily life including cooking and performing light house work without complication. Most of her mild limitations are related to her pain associated with her lumbar stenosis. According to the Revised Cardiac Risk Index=1 indicating a 0.9% risk of major cardiac event (low). According to the Duke Activity Index scoring system, her functional capacity is 5.62 METS.   Past Medical History:  Diagnosis Date  . Anemia, pernicious   . Arthritis    bursitis   . Atrophic gastritis   . B12 deficiency   . CAD (coronary artery disease)   . Carotid artery stenosis   . Diarrhea    on and off for months  . Family history of adverse reaction to anesthesia    extreme nausea brother and sister  . Fibromyalgia   . Gallbladder polyp   . GERD (gastroesophageal reflux disease)   . Heart murmur   . History of hiatal hernia   . History of kidney stones   . HLD (hyperlipidemia)   . HTN (hypertension)   . Hypothyroidism   . Irritable bowel syndrome (IBS)   . Median arcuate ligament syndrome (  [name])   . PVD (peripheral vascular disease) ([place])   . Tubular adenoma of colon 08/2013   with focal high grade dysplasia    Past Surgical History:  Procedure Laterality Date  . ABDOMINAL HYSTERECTOMY    . APPENDECTOMY    . BIOPSY THYROID    . CHOLECYSTECTOMY N/A 10/24/2014   Procedure: MICROSCOPIC  CHOLECYSTECTOMY;  Surgeon: Sally Ok, MD;  Location: [facility];  Service: General;  Laterality: N/A;  . CORONARY ARTERY BYPASS GRAFT  07  . ESOPHAGEAL MANOMETRY N/A 08/02/2012   Procedure: ESOPHAGEAL MANOMETRY  (EM);  Surgeon: Sally Feil, MD;  Location: [facility];  Service: Endoscopy;  Laterality: N/A;  . THYROID LOBECTOMY       Current Outpatient Medications  Medication Sig Dispense Refill  . acetaminophen (TYLENOL) 500 MG tablet Take 500 mg by mouth every 6 (six) hours as needed (pain).    Marland Kitchen amLODipine (NORVASC) 10 MG tablet TAKE 1 TABLET BY MOUTH DAILY 90 tablet 0  . apixaban (ELIQUIS) 2.5 MG TABS tablet Take 1 tablet (2.5 mg total) by mouth 2 (two) times daily. 180 tablet 3  . atorvastatin (LIPITOR) 80 MG tablet TAKE 1 TABLET BY MOUTH AT BEDTIME 90 tablet 3  . azelastine (ASTELIN) 0.1 % nasal spray Place 1 spray into both nostrils 2 (two) times daily as needed for rhinitis or allergies. Use in each nostril as directed    . Calcium Carbonate-Vitamin D (CALTRATE 600+D PO) Take 600 mg by mouth daily with lunch.    Marland Kitchen CARAFATE 1 GM/10ML suspension TAKE 10 MLS (1 G TOTAL) BY MOUTH 2 (TWO) TIMES DAILY. 420 mL 1  . carvedilol (COREG) 6.25 MG tablet TAKE 1/2 TABLET BY MOUTH TWICE DAILY 180 tablet 1  . cyanocobalamin (,VITAMIN B-12,) 1000 MCG/ML injection Inject 1,000 mcg into the muscle every 30 (thirty) days. On or about the end of each month    . Cyanocobalamin (B-12) 1000 MCG/ML KIT cyanocobalamin (vit B-12) 1,000 mcg/mL injection kit  Inject 1 mL every month by intramuscular route.    . fluticasone (FLONASE) 50 MCG/ACT nasal spray Place 1 spray daily as needed into both nostrils for allergies or rhinitis.    . hydrALAZINE (APRESOLINE) 50 MG tablet TAKE 1 TABLET BY MOUTH 3 TIMES DAILY 90 tablet 3  . levothyroxine (SYNTHROID, LEVOTHROID) 50 MCG tablet Take 50 mcg by mouth daily before breakfast.    . losartan (COZAAR) 100 MG tablet TAKE 1 TABLET BY MOUTH EVERY DAY 90 tablet 1  . Multiple Vitamins-Minerals (OCUVITE-LUTEIN PO) PreserVision AREDS 2    . sodium chloride (OCEAN) 0.65 % nasal spray Place 1 spray into both nostrils 3 (three) times daily as needed for congestion.     Marland Kitchen spironolactone  (ALDACTONE) 25 MG tablet TAKE 1 TABLET BY MOUTH ONCE DAILY 90 tablet 2  . zoledronic acid (RECLAST) 5 MG/100ML SOLN injection Reclast     No current facility-administered medications for this visit.     Allergies:   Actos [pioglitazone]; Sulfonamide derivatives; and Codeine    Social History:  The patient  reports that she has never smoked. She has never used smokeless tobacco. She reports that she does not drink alcohol or use drugs.   Family History:  The patient's family history includes Breast cancer in an other family member; Cholelithiasis in her mother; Colon cancer in her brother; Diabetes in her sister; Heart disease in her mother; Lung cancer in her father and sister; Prostate cancer in her brother; Stomach cancer in her cousin.  ROS:  Please see the history of present illness. Otherwise, review of systems are positive for none. All other systems are reviewed and negative.    PHYSICAL EXAM: VS:  BP 130/60   Pulse (!) 56   Ht 5' 3.5" (1.613 m)   Wt 100 lb 12.8 oz (45.7 kg)   SpO2 98%   BMI 17.58 kg/m  , BMI Body mass index is 17.58 kg/m.   General: Frail, elderly,  NAD Skin: Warm, dry, intact  Head: Normocephalic, atraumatic, clear, moist mucus membranes. Neck: Negative for carotid bruits. No JVD Lungs:Clear to ausculation bilaterally. No wheezes, rales, or rhonchi. Breathing is unlabored. Cardiovascular: RRR with S1 S2. No murmurs, rubs, gallops, or LV heave appreciated. Abdomen: Soft, non-tender, non-distended MSK: Strength and tone appear normal for age. 5/5 in all extremities Extremities: No edema. No clubbing or cyanosis. DP/PT pulses 2+ bilaterally Neuro: Alert and oriented. No focal deficits. No facial asymmetry. MAE spontaneously. Psych: Responds to questions appropriately with normal affect.     EKG:  EKG is ordered today. The ekg ordered today demonstrates SB with LBBB (old)  Recent Labs: 03/03/2018: Magnesium 2.3 04/05/2018: Hemoglobin 11.4; Platelets  254; TSH 4.390 06/25/2018: BUN 19; Creatinine, Ser 0.90; NT-Pro BNP 459; Potassium 3.8; Sodium 140   Lipid Panel    Component Value Date/Time   CHOL 135 11/18/2016   TRIG 54 11/18/2016   TRIG 85 02/05/2006 0818   HDL 69.20 11/22/2014 0919   CHOLHDL 2 11/22/2014 0919   VLDL 10.6 11/22/2014 0919   LDLCALC 66 11/22/2014 0919   Wt Readings from Last 3 Encounters:  06/30/18 100 lb 12.8 oz (45.7 kg)  06/29/18 101 lb 4.8 oz (45.9 kg)  05/14/18 101 lb (45.8 kg)    Other studies Reviewed: Additional studies/ records that were reviewed today include:  Echocardiogram 02/2017: Study Conclusions  - Left ventricle: The cavity size was normal. Systolic function was normal. The estimated ejection fraction was in the range of 55% to 60%. Wall motion was normal; there were no regional wall motion abnormalities. Features are consistent with a pseudonormal left ventricular filling pattern, with concomitant abnormal relaxation and increased filling pressure (grade 2 diastolic dysfunction). Doppler parameters are consistent with elevated ventricular end-diastolic filling pressure. - Aortic valve: There was moderate regurgitation. - Mitral valve: There was moderate regurgitation. - Left atrium: The atrium was moderately dilated. - Right ventricle: The cavity size was normal. Wall thickness was normal. Systolic function was normal. - Right atrium: The atrium was mildly dilated. - Tricuspid valve: There was moderate regurgitation. - Pulmonary arteries: Systolic pressure was moderately increased. PA peak pressure: 63 mm Hg (S). - Pericardium, extracardiac: There was no pericardial effusion.  Impressions:  - When compared to the prior study from 03/26/2017, tricuspid regurgitation is now severe, there is new moderate pulmonary hypertension.  Carotid doppler 12/2017:  Right Carotid: Velocities in the right ICA are consistent with a 1-39% stenosis. The RICA velocities  remain within normal range and have decreased compared to the prior exam. Left Carotid: Velocities in the left ICA are consistent with a 1-39% stenosis. The ECA appears >50% stenosed. The LICA velocities remain withinnormal range is essentially Valdez compared to the prior exam.  ASSESSMENT AND PLAN:  1. Chronic diastolic heart failure: -Lasix decreased to PRN as she cannot tolerate it on a regular basis>>>reports no need to use since last office visit  -No heart failure symptoms, appears euvolemic on exam -Had preoperative labs drawn 06/25/2018 with normal creatinine -ProBNP was normal at  459  2. Non-rheumatic tricuspid valve regurgitation: -Reviewed by [name], with mixed valvular disease with moderate to severe tricuspid regurgitation -Thought to be managed with medical therapy and ongoing surveillance and no thought to need intervention at that time -Continue to monitor closely   3. CAD without angina: -No anginal symptoms -Continue statin, BB, no ASA in the setting of bleeding in the past and now need for [facility] secondary to PAF  4. Paroxysmal atrial fibrillaiton: -Last EKG with NSR, Eliquis was continued at low dose 2.57m twice daily given her age and  -EKG today with no change from prior tracing, has evidence of intraventricular/LBBB  -Dr. CBurt Knackstopped ASA given the need for Eliquis  -CHA2DS2VASc = 6 (age, female,CHF, HTN, vascular disease) -No reports of acute bleeding, denies palpitations   5. Pre-operative clearance: -Procedure: L4-5 LUMBAR LAMINECTOMY -CHADS2-VASc score of  6 (CHF, HTN, AGE, DM2, stroke/tia x 2, CAD, AGE, female) -Per office protocol, patient can hold Eliquis for 3 days prior to procedure.   -Revised Cardiac Risk Index=1 indicating a 0.9% low risk of major cardiac event. -According to the Duke Activity Index scoring system, her functional capacity is 5.62 METS.  -Pt is without cardiac complaints, BP and HR are normal. Chart reviewed as part of  pre-operative protocol coverage. Given past medical history and time since last visit, based on ACC/AHA guidelines, Ms. CNeace would be at acceptable risk for the planned procedure without further cardiovascular testing. Given the above information, cardiology team will clkear her    Current medicines are reviewed at length with the patient today.  The patient does not have concerns regarding medicines.  The following changes have been made:  no change  Labs/ tests ordered today include: None   Orders Placed This Encounter  Procedures  . EKG 12-Lead    Disposition:   FU with Dr. CBurt Knackin 6 months  Signed, JKathyrn Drown NP  06/30/2018 2:40 PM    [facility] [address] [place]  [id number]: [phone] Fax: [phone]

## 2018-06-30 ENCOUNTER — Ambulatory Visit: Payer: Medicare Other | Admitting: Cardiology

## 2018-06-30 ENCOUNTER — Encounter: Payer: Self-pay | Admitting: Physician Assistant

## 2018-06-30 VITALS — BP 130/60 | HR 56 | Ht 63.5 in | Wt 100.8 lb

## 2018-06-30 DIAGNOSIS — I10 Essential (primary) hypertension: Secondary | ICD-10-CM

## 2018-06-30 DIAGNOSIS — D649 Anemia, unspecified: Secondary | ICD-10-CM

## 2018-06-30 DIAGNOSIS — I480 Paroxysmal atrial fibrillation: Secondary | ICD-10-CM

## 2018-06-30 DIAGNOSIS — I6523 Occlusion and stenosis of bilateral carotid arteries: Secondary | ICD-10-CM

## 2018-06-30 DIAGNOSIS — I2510 Atherosclerotic heart disease of native coronary artery without angina pectoris: Secondary | ICD-10-CM | POA: Diagnosis not present

## 2018-06-30 DIAGNOSIS — I5032 Chronic diastolic (congestive) heart failure: Secondary | ICD-10-CM | POA: Diagnosis not present

## 2018-06-30 DIAGNOSIS — E785 Hyperlipidemia, unspecified: Secondary | ICD-10-CM | POA: Diagnosis not present

## 2018-06-30 NOTE — Patient Instructions (Signed)
Medication Instructions:   Your physician recommends that you continue on your current medications as directed. Please refer to the Current Medication list given to you today.   If you need a refill on your cardiac medications before your next appointment, please call your pharmacy.   Lab work: NONE ORDERED  TODAY    If you have labs (blood work) drawn today and your tests are completely normal, you will receive your results only by: . MyChart Message (if you have MyChart) OR . A paper copy in the mail If you have any lab test that is abnormal or we need to change your treatment, we will call you to review the results.  Testing/Procedures: NONE ORDERED  TODAY     Follow-Up: At [facility], you and your health needs are our priority.  As part of our continuing mission to provide you with exceptional heart care, we have created designated Provider Care Teams.  These Care Teams include your primary Cardiologist (physician) and Advanced Practice Providers (APPs -  Physician Assistants and Nurse Practitioners) who all work together to provide you with the care you need, when you need it. You will need a follow up appointment in:  6 months.  Please call our office 2 months in advance to schedule this appointment.  You may see Michael Cooper, MD or one of the following Advanced Practice Providers on your designated Care Team: Scott Weaver, PA-C Vin Bhagat, PA-C . Janine Hammond, NP  Any Other Special Instructions Will Be Listed Below (If Applicable).    

## 2018-07-20 ENCOUNTER — Other Ambulatory Visit: Payer: Self-pay | Admitting: Family Medicine

## 2018-08-27 ENCOUNTER — Encounter

## 2018-08-30 ENCOUNTER — Other Ambulatory Visit: Payer: Self-pay | Admitting: Family Medicine

## 2018-09-15 ENCOUNTER — Other Ambulatory Visit: Payer: Self-pay

## 2018-09-15 ENCOUNTER — Ambulatory Visit (INDEPENDENT_AMBULATORY_CARE_PROVIDER_SITE_OTHER): Payer: Medicare Other | Admitting: Family Medicine

## 2018-09-15 ENCOUNTER — Telehealth: Payer: Self-pay | Admitting: *Deleted

## 2018-09-15 DIAGNOSIS — R197 Diarrhea, unspecified: Secondary | ICD-10-CM

## 2018-09-15 DIAGNOSIS — E538 Deficiency of other specified B group vitamins: Secondary | ICD-10-CM

## 2018-09-15 DIAGNOSIS — K52832 Lymphocytic colitis: Secondary | ICD-10-CM | POA: Diagnosis not present

## 2018-09-15 NOTE — Telephone Encounter (Signed)
Copied from [place] [phone] [phone]. Topic: General - Call Back - No Documentation >> Sep 15, 2018 10:40 AM Erick Blinks wrote: Reason for CRM: Pt's husband requesting call back as soon as possible. He states that pt has not been feeling well and wants to speak to nurse/PCP. Declined scheduling appt. Please advise [phone]

## 2018-09-15 NOTE — Telephone Encounter (Signed)
We conducted Doxy follow up.  See office note.

## 2018-09-15 NOTE — Telephone Encounter (Signed)
Called patient and she stated that she has diarrhea this morning, feeling weak, not able to eat, did have a few sips of boost drink. Transfer to Dr. Elease Hashimoto due to appointment at 11:15am

## 2018-09-15 NOTE — Progress Notes (Signed)
Patient ID: [name], female   DOB: 16-Aug-1932, 85 y.o.   MRN: [id number]  This visit type was conducted due to national recommendations for restrictions regarding the COVID-19 pandemic in an effort to limit this patient's exposure and mitigate transmission in our community.   Virtual Visit via Telephone Note  I connected with [name] on 09/15/18 at 11:15 AM EDT by telephone and verified that I am speaking with the correct person using two identifiers.   I discussed the limitations, risks, security and privacy concerns of performing an evaluation and management service by telephone and the availability of in person appointments. I also discussed with the patient that there may be a patient responsible charge related to this service. The patient expressed understanding and agreed to proceed.  Location patient: home Location provider: work or home office Participants present for the call: patient, provider Patient did not have a visit in the prior 7 days to address this/these issue(s).   History of Present Illness:  Onset of diarrhea this morning.  She states yesterday she felt slightly "queasy "but no vomiting.  This morning she has kept down some sips of liquids.  She denies any abdominal pain.  No fever.  She has had 2 episodes of watery nonbloody diarrhea this morning but symptoms seemed to leveled off.  No fever.  Occasional mild abdominal cramps.  No recent antibiotics.  She has had some recent allergy symptoms but denies significant postnasal drip.  Husband has no symptoms.  Patient has been fairly isolated which makes infectious cause less likely.  Past history of loose stools.  Back in 2017 she had colonoscopy with 14 mm adenomatous polyp in the rectum and biopsy revealing lymphocytic colitis.  She was treated at that time with Entocort and after couple months of treatment her symptoms resolved.  She has had past history of some gastritis.  She had EGD 11/30/2013 with gastritis  changes.  Denies any recent upper abdominal pain.  She has history of B12 deficiency.  History of poor absorption.  She had been getting monthly injections.  She had a level 06/29/2018 of over 1500.   Observations/Objective: Patient sounds cheerful and well on the phone. I do not appreciate any SOB. Speech and thought processing are grossly intact. Patient reported vitals:  Assessment and Plan:  #1  One day history of watery diarrhea.  Past history of lymphocytic colitis.  Does not have any red flags such as bloody stools, fever, abdominal pain  -We recommended a bland diet-example brat diet and avoidance of high glycemic, high fat, spicy foods, etc. -Follow-up immediately for any fever, bloody stools, abdominal pain -Increase fluid intake -Consider follow-up with GI if symptoms persist  #2 history of B12 deficiency.    -Reassurance to patient that we should be fine to wait another month or so to start back her B12 injections  Follow Up Instructions:  -As above   99441 5-10 99442 11-20 99443 21-30 I did not refer this patient for an OV in the next 24 hours for this/these issue(s).  I discussed the assessment and treatment plan with the patient. The patient was provided an opportunity to ask questions and all were answered. The patient agreed with the plan and demonstrated an understanding of the instructions.   The patient was advised to call back or seek an in-person evaluation if the symptoms worsen or if the condition fails to improve as anticipated.  I provided 23 minutes of non-face-to-face time during this encounter.  Carolann Littler, MD

## 2018-09-27 ENCOUNTER — Telehealth: Payer: Self-pay | Admitting: Gastroenterology

## 2018-09-27 MED ORDER — HYOSCYAMINE SULFATE 0.125 MG SL SUBL: 0.1250 mg | SUBLINGUAL_TABLET | Freq: Three times a day (TID) | SUBLINGUAL | 0 refills | Status: DC | PRN

## 2018-09-27 MED ORDER — SUCRALFATE 1 GM/10ML PO SUSP: ORAL | 1 refills | Status: DC

## 2018-09-27 NOTE — Telephone Encounter (Signed)
The pt was put on levsin and carafate as well as benefiber in March last year by Alonza Bogus for loose stools.  The pt has done well since then.  She stopped the levsin and Carafate and benefiber and her loose stools returned.  NO other symptoms, denies rectal bleeding, fever or abd pain.  She does have some abd discomfort prior to having a BM.  She was advised to go back to benefiber, levsin and carafate as prescribed and call back if this does not resolve her symptoms.  The pt has been advised of the information and verbalized understanding.

## 2018-09-27 NOTE — Telephone Encounter (Signed)
Pt called and wanted to speak with the nurse for some advised. She states she is having diarrhea,nausea,loss of appetite.

## 2018-10-11 ENCOUNTER — Other Ambulatory Visit: Payer: Self-pay

## 2018-10-11 ENCOUNTER — Ambulatory Visit (INDEPENDENT_AMBULATORY_CARE_PROVIDER_SITE_OTHER): Payer: Medicare Other

## 2018-10-11 DIAGNOSIS — E538 Deficiency of other specified B group vitamins: Secondary | ICD-10-CM

## 2018-10-11 MED ORDER — CYANOCOBALAMIN 1000 MCG/ML IJ SOLN
1000.0000 ug | Freq: Once | INTRAMUSCULAR | Status: AC
  Administered 2018-10-11: 1000 ug via INTRAMUSCULAR

## 2018-10-11 NOTE — Progress Notes (Signed)
Per orders of Dr. Elease Hashimoto, injection of B12 given by Rebecca Eaton. Patient tolerated injection well.  Please advise in Dr. Doreatha Martin.

## 2018-10-13 ENCOUNTER — Ambulatory Visit: Payer: Medicare Other | Admitting: Family Medicine

## 2018-10-27 ENCOUNTER — Other Ambulatory Visit: Payer: Self-pay | Admitting: Family Medicine

## 2018-11-01 ENCOUNTER — Other Ambulatory Visit: Payer: Self-pay

## 2018-11-01 NOTE — Patient Outreach (Signed)
[facility]) Care Management  11/01/2018  [name] 04/16/1933 [id number]   Medication Adherence call to Mrs. Tina Griffiths [facility] Identifiers Verify spoke with patient she is past due on Losartan 100 mg and Atorvastatin 80 mg patient explain she is taking 1 tablet daily and has received a 90 days supply a couple of days ago.patient is showing past due under [place],  [place] Management Direct Dial [phone]  Fax [phone] [email or web address]

## 2018-11-22 ENCOUNTER — Other Ambulatory Visit: Payer: Self-pay | Admitting: Family Medicine

## 2018-11-22 ENCOUNTER — Other Ambulatory Visit: Payer: Self-pay

## 2018-11-22 ENCOUNTER — Ambulatory Visit (INDEPENDENT_AMBULATORY_CARE_PROVIDER_SITE_OTHER): Payer: Medicare Other | Admitting: *Deleted

## 2018-11-22 DIAGNOSIS — E538 Deficiency of other specified B group vitamins: Secondary | ICD-10-CM

## 2018-11-22 MED ORDER — CYANOCOBALAMIN 1000 MCG/ML IJ SOLN
1000.0000 ug | Freq: Once | INTRAMUSCULAR | Status: AC
  Administered 2018-11-22: 1000 ug via INTRAMUSCULAR

## 2018-11-22 NOTE — Progress Notes (Signed)
Patient in for B-12 injection. B-12 administered with no reactions.

## 2018-11-26 ENCOUNTER — Telehealth: Payer: Self-pay | Admitting: *Deleted

## 2018-11-26 ENCOUNTER — Ambulatory Visit (INDEPENDENT_AMBULATORY_CARE_PROVIDER_SITE_OTHER): Payer: Medicare Other | Admitting: Family Medicine

## 2018-11-26 ENCOUNTER — Other Ambulatory Visit: Payer: Self-pay

## 2018-11-26 DIAGNOSIS — R079 Chest pain, unspecified: Secondary | ICD-10-CM

## 2018-11-26 NOTE — Telephone Encounter (Signed)
Copied from [place] [id number]. Topic: Appointment Scheduling - Scheduling Inquiry for Clinic >> Nov 26, 2018  4:23 PM Virl Axe D wrote: Reason for CRM: Pt completed a virtual visit with Dr. Elease Hashimoto today. He would like to see her in office on 11/29/18 or 11/30/18. No answer on FC line to schedule. Please return call.

## 2018-11-26 NOTE — Progress Notes (Signed)
Patient ID: [name], female   DOB: 01/13/1933, 85 y.o.   MRN: [id number]  This visit type was conducted due to national recommendations for restrictions regarding the COVID-19 pandemic in an effort to limit this patient's exposure and mitigate transmission in our community.   Virtual Visit via Telephone Note  I connected with [name] on 11/26/18 at  2:00 PM EDT by telephone and verified that I am speaking with the correct person using two identifiers.   I discussed the limitations, risks, security and privacy concerns of performing an evaluation and management service by telephone and the availability of in person appointments. I also discussed with the patient that there may be a patient responsible charge related to this service. The patient expressed understanding and agreed to proceed.  Location patient: home Location provider: work or home office Participants present for the call: patient, provider Patient did not have a visit in the prior 7 days to address this/these issue(s).   History of Present Illness: Patient relates several weeks (if not months) of pain under her left breast and radiating somewhat toward the back.  She states this is about midway down her rib cage area.  This radiates up under her arm.  She states her pain is worse with things like reaching overhead.  She denies any injury.  Symptoms were initially more intermittent but becoming more intense and more progressive in severity.  No recent skin rashes.  She describes pain as "sharp ".  Nonpleuritic.  No cough.  No fever.  She has tried some heat and ice and Tylenol with minimal relief.  No exertional chest tightness.  No dyspnea.  No abdominal pain.   Observations/Objective: Patient sounds cheerful and well on the phone. I do not appreciate any SOB. Speech and thought processing are grossly intact. Patient reported vitals:  Assessment and Plan: Patient relates several week and possibly month history  of left chest wall pain.  This is worse with certain movements and also somewhat sore to touch which suggest likely musculoskeletal origin  -We recommended office follow-up next Monday or Tuesday to further assess.  Follow Up Instructions:  -30-minute office follow-up next Monday or Tuesday   99441 5-10 99442 11-20 99443 21-30 I did not refer this patient for an OV in the next 24 hours for this/these issue(s).  I discussed the assessment and treatment plan with the patient. The patient was provided an opportunity to ask questions and all were answered. The patient agreed with the plan and demonstrated an understanding of the instructions.   The patient was advised to call back or seek an in-person evaluation if the symptoms worsen or if the condition fails to improve as anticipated.  I provided 23 minutes of non-face-to-face time during this encounter.   Carolann Littler, MD

## 2018-11-29 ENCOUNTER — Ambulatory Visit (INDEPENDENT_AMBULATORY_CARE_PROVIDER_SITE_OTHER): Payer: Medicare Other

## 2018-11-29 ENCOUNTER — Ambulatory Visit (INDEPENDENT_AMBULATORY_CARE_PROVIDER_SITE_OTHER): Payer: Medicare Other | Admitting: Family Medicine

## 2018-11-29 ENCOUNTER — Other Ambulatory Visit: Payer: Medicare Other

## 2018-11-29 ENCOUNTER — Other Ambulatory Visit: Payer: Self-pay

## 2018-11-29 ENCOUNTER — Encounter: Payer: Self-pay | Admitting: Family Medicine

## 2018-11-29 ENCOUNTER — Ambulatory Visit (HOSPITAL_COMMUNITY): Admission: RE | Admit: 2018-11-29 | Payer: Medicare Other | Source: Ambulatory Visit

## 2018-11-29 VITALS — BP 112/62 | HR 66 | Temp 98.1°F | Ht 63.5 in | Wt 98.2 lb

## 2018-11-29 DIAGNOSIS — M546 Pain in thoracic spine: Secondary | ICD-10-CM

## 2018-11-29 DIAGNOSIS — D509 Iron deficiency anemia, unspecified: Secondary | ICD-10-CM

## 2018-11-29 NOTE — Progress Notes (Signed)
Subjective:     Patient ID: [name], female   DOB: 05/06/1932, 85 y.o.   MRN: [id number]  HPI   Patient is seen to evaluate thoracic back pain which has been going on now for several months.  She states this probably started around the time of the onset of the pandemic.  Refer to recent note from virtual visit on 11/26/2018 for further details  Patient relates several weeks (if not months) of pain under her left breast and radiating somewhat toward the back.  She states this is about midway down her rib cage area.  This radiates up under her arm.  She states her pain is worse with things like reaching overhead.  She denies any injury.  Symptoms were initially more intermittent but becoming more intense and more progressive in severity.  No recent skin rashes.  She describes pain as "sharp ".  Nonpleuritic.  No cough.  No fever.  She has tried some heat and ice and Tylenol with minimal relief.  No exertional chest tightness.  No dyspnea.  No abdominal pain.  On further questioning today she states the pain is actually more in the back and radiates more anterior she thinks but is predominantly confined to the area just left of her spine.  Her appetite is fair and weight is stable.  She denied any night sweats.  No fever.  Past Medical History:  Diagnosis Date  . Anemia, pernicious   . Arthritis    bursitis   . Atrophic gastritis   . B12 deficiency   . CAD (coronary artery disease)   . Carotid artery stenosis   . Diarrhea    on and off for months  . Family history of adverse reaction to anesthesia    extreme nausea brother and sister  . Fibromyalgia   . Gallbladder polyp   . GERD (gastroesophageal reflux disease)   . Heart murmur   . History of hiatal hernia   . History of kidney stones   . HLD (hyperlipidemia)   . HTN (hypertension)   . Hypothyroidism   . Irritable bowel syndrome (IBS)   . Median arcuate ligament syndrome ([place])   . PVD (peripheral vascular disease) ([place])    . Tubular adenoma of colon 08/2013   with focal high grade dysplasia   Past Surgical History:  Procedure Laterality Date  . ABDOMINAL HYSTERECTOMY    . APPENDECTOMY    . BIOPSY THYROID    . CHOLECYSTECTOMY N/A 10/24/2014   Procedure: MICROSCOPIC  CHOLECYSTECTOMY;  Surgeon: Ralene Ok, MD;  Location: [place];  Service: General;  Laterality: N/A;  . CORONARY ARTERY BYPASS GRAFT  07  . ESOPHAGEAL MANOMETRY N/A 08/02/2012   Procedure: ESOPHAGEAL MANOMETRY (EM);  Surgeon: Sable Feil, MD;  Location: [facility];  Service: Endoscopy;  Laterality: N/A;  . THYROID LOBECTOMY      reports that she has never smoked. She has never used smokeless tobacco. She reports that she does not drink alcohol or use drugs. family history includes Breast cancer in an other family member; Cholelithiasis in her mother; Colon cancer in her brother; Diabetes in her sister; Heart disease in her mother; Lung cancer in her father and sister; Prostate cancer in her brother; Stomach cancer in her cousin. Allergies  Allergen Reactions  . Actos [Pioglitazone] Swelling    Swelling  . Sulfonamide Derivatives Swelling  . Codeine Nausea And Vomiting     Review of Systems  Constitutional: Negative for appetite change, chills, fever and  unexpected weight change.  Respiratory: Negative for cough and shortness of breath.   Cardiovascular: Negative for chest pain.  Gastrointestinal: Negative for abdominal pain, nausea and vomiting.  Musculoskeletal: Positive for back pain. Negative for neck pain.  Skin: Negative for rash.  Neurological: Negative for dizziness.  Hematological: Negative for adenopathy.       Objective:   Physical Exam Constitutional:      Appearance: Normal appearance.  Cardiovascular:     Rate and Rhythm: Normal rate and regular rhythm.  Pulmonary:     Effort: Pulmonary effort is normal.     Breath sounds: Normal breath sounds. No wheezing or rales.  Musculoskeletal:     Right lower leg:  No edema.     Left lower leg: No edema.     Comments: She has some muscular tenderness just left of the thoracic spine around the T8 region.  No spinal tenderness.  Skin:    Findings: No rash.  Neurological:     Mental Status: She is alert.        Assessment:     Several month history of progressive thoracic back pain.  She has not had any red flags such as weight loss or fever    Plan:     -Given duration we recommended further evaluation with thoracic spine films.  We will also obtain CBC, comprehensive metabolic panel, sed rate -If all the above negative consider trial of physical therapy  [name] MD [facility]

## 2018-11-29 NOTE — Telephone Encounter (Signed)
Called patient and she has an appointment today at 3pm in office. Patient verbalized an understanding.

## 2018-11-29 NOTE — Patient Instructions (Signed)
We will call you with lab and x-ray results.

## 2018-11-29 NOTE — Progress Notes (Signed)
Patient is in pain 

## 2018-11-30 DIAGNOSIS — M546 Pain in thoracic spine: Secondary | ICD-10-CM | POA: Diagnosis not present

## 2018-11-30 LAB — COMPREHENSIVE METABOLIC PANEL
ALT: 19 U/L (ref 0–35)
AST: 28 U/L (ref 0–37)
Albumin: 4 g/dL (ref 3.5–5.2)
Alkaline Phosphatase: 40 U/L (ref 39–117)
BUN: 22 mg/dL (ref 6–23)
CO2: 25 mEq/L (ref 19–32)
Calcium: 9 mg/dL (ref 8.4–10.5)
Chloride: 107 mEq/L (ref 96–112)
Creatinine, Ser: 0.98 mg/dL (ref 0.40–1.20)
GFR: 54.06 mL/min — ABNORMAL LOW (ref >60.00)
Glucose, Bld: 135 mg/dL — ABNORMAL HIGH (ref 70–99)
Potassium: 3.9 mEq/L (ref 3.5–5.1)
Sodium: 140 mEq/L (ref 135–145)
Total Bilirubin: 0.3 mg/dL (ref 0.2–1.2)
Total Protein: 6.7 g/dL (ref 6.0–8.3)

## 2018-11-30 LAB — CBC WITH DIFFERENTIAL/PLATELET
Basophils Absolute: 0.1 10*3/uL (ref 0.0–0.1)
Basophils Relative: 1.4 % (ref 0.0–3.0)
Eosinophils Absolute: 0 10*3/uL (ref 0.0–0.7)
Eosinophils Relative: 1 % (ref 0.0–5.0)
HCT: 29.7 % — ABNORMAL LOW (ref 36.0–46.0)
Hemoglobin: 9.4 g/dL — ABNORMAL LOW (ref 12.0–15.0)
Lymphocytes Relative: 19 % (ref 12.0–46.0)
Lymphs Abs: 0.8 10*3/uL (ref 0.7–4.0)
MCHC: 31.6 g/dL (ref 30.0–36.0)
MCV: 77.2 fl — ABNORMAL LOW (ref 78.0–100.0)
Monocytes Absolute: 0.5 10*3/uL (ref 0.1–1.0)
Monocytes Relative: 11 % (ref 3.0–12.0)
Neutro Abs: 2.9 10*3/uL (ref 1.4–7.7)
Neutrophils Relative %: 67.6 % (ref 43.0–77.0)
Platelets: 195 10*3/uL (ref 150.0–400.0)
RBC: 3.85 Mil/uL — ABNORMAL LOW (ref 3.87–5.11)
RDW: 16.7 % — ABNORMAL HIGH (ref 11.5–15.5)
WBC: 4.4 10*3/uL (ref 4.0–10.5)

## 2018-11-30 LAB — SEDIMENTATION RATE: Sed Rate: 13 mm/hr (ref 0–30)

## 2018-11-30 NOTE — Addendum Note (Signed)
Addended by: [name] on: 11/30/2018 12:57 PM   Modules accepted: Orders

## 2018-12-01 ENCOUNTER — Encounter: Payer: Self-pay | Admitting: Gastroenterology

## 2018-12-02 ENCOUNTER — Other Ambulatory Visit: Payer: Self-pay

## 2018-12-02 ENCOUNTER — Other Ambulatory Visit (INDEPENDENT_AMBULATORY_CARE_PROVIDER_SITE_OTHER): Payer: Medicare Other

## 2018-12-02 DIAGNOSIS — D509 Iron deficiency anemia, unspecified: Secondary | ICD-10-CM

## 2018-12-02 LAB — FERRITIN: Ferritin: 9.8 ng/mL — ABNORMAL LOW (ref 10.0–291.0)

## 2018-12-03 LAB — IRON, TOTAL/TOTAL IRON BINDING CAP
%SAT: 5 % (calc) — ABNORMAL LOW (ref 16–45)
Iron: 21 ug/dL — ABNORMAL LOW (ref 45–160)
TIBC: 410 mcg/dL (calc) (ref 250–450)

## 2018-12-23 DIAGNOSIS — Z1231 Encounter for screening mammogram for malignant neoplasm of breast: Secondary | ICD-10-CM | POA: Diagnosis not present

## 2018-12-23 LAB — HM MAMMOGRAPHY

## 2018-12-27 DIAGNOSIS — M519 Unspecified thoracic, thoracolumbar and lumbosacral intervertebral disc disorder: Secondary | ICD-10-CM | POA: Insufficient documentation

## 2019-01-01 ENCOUNTER — Other Ambulatory Visit: Payer: Self-pay | Admitting: Family Medicine

## 2019-01-04 ENCOUNTER — Other Ambulatory Visit (INDEPENDENT_AMBULATORY_CARE_PROVIDER_SITE_OTHER): Payer: Medicare Other

## 2019-01-04 ENCOUNTER — Ambulatory Visit (INDEPENDENT_AMBULATORY_CARE_PROVIDER_SITE_OTHER): Payer: Medicare Other | Admitting: Gastroenterology

## 2019-01-04 ENCOUNTER — Other Ambulatory Visit: Payer: Self-pay

## 2019-01-04 ENCOUNTER — Encounter: Payer: Self-pay | Admitting: Gastroenterology

## 2019-01-04 VITALS — BP 144/68 | HR 65 | Temp 97.8°F | Ht 63.5 in | Wt 98.8 lb

## 2019-01-04 DIAGNOSIS — R634 Abnormal weight loss: Secondary | ICD-10-CM | POA: Diagnosis not present

## 2019-01-04 DIAGNOSIS — D649 Anemia, unspecified: Secondary | ICD-10-CM

## 2019-01-04 DIAGNOSIS — R1013 Epigastric pain: Secondary | ICD-10-CM | POA: Diagnosis not present

## 2019-01-04 LAB — CREATININE, SERUM: Creatinine, Ser: 0.94 mg/dL (ref 0.40–1.20)

## 2019-01-04 LAB — BUN: BUN: 19 mg/dL (ref 6–23)

## 2019-01-04 NOTE — Patient Instructions (Signed)
You have been scheduled for a CT scan of the abdomen and pelvis at [facility] ([address]---this is in the same building as [facility]).   You are scheduled on 01/25/19 at 3pm. You should arrive 15 minutes prior to your appointment time for registration. Please follow the written instructions below on the day of your exam:  WARNING: IF YOU ARE ALLERGIC TO IODINE/X-RAY DYE, PLEASE NOTIFY RADIOLOGY IMMEDIATELY AT [phone]! YOU WILL BE GIVEN A 13 HOUR PREMEDICATION PREP.  1) Do not eat or drink anything after 11am (4 hours prior to your test) 2) You have been given 2 bottles of oral contrast to drink. The solution may taste better if refrigerated, but do NOT add ice or any other liquid to this solution. Shake well before drinking.    Drink 1 bottle of contrast @ 1pm (2 hours prior to your exam)  Drink 1 bottle of contrast @ 2pm (1 hour prior to your exam)  You may take any medications as prescribed with a small amount of water, if necessary. If you take any of the following medications: METFORMIN, GLUCOPHAGE, GLUCOVANCE, AVANDAMET, RIOMET, FORTAMET, [place] MET, JANUMET, GLUMETZA or METAGLIP, you MAY be asked to HOLD this medication 48 hours AFTER the exam.  The purpose of you drinking the oral contrast is to aid in the visualization of your intestinal tract. The contrast solution may cause some diarrhea. Depending on your individual set of symptoms, you may also receive an intravenous injection of x-ray contrast/dye. Plan on being at [facility] for 30 minutes or longer, depending on the type of exam you are having performed.  This test typically takes 30-45 minutes to complete.  If you have any questions regarding your exam or if you need to reschedule, you may call the CT department at [phone] between the hours of 8:00 am and 5:00 pm, Monday-Friday.  Thank you for entrusting me with your care and choosing [facility].  Dr  Ardis Hughs   ________________________________________________________________________

## 2019-01-04 NOTE — Progress Notes (Signed)
Editor: Milus Banister, MD (Physician)    Review of pertinent gastrointestinal problems: 1. Adenomatous polyps in colon:several colonoscopies Dr. Verl Blalock over the years. Her first was in 1993, her next one was in 2002, her next was in 2006. No polyps removed in those 3 procedures. Colonoscopy Dr. Ardis Hughs 08/2013 (for FOBT + stool); 2 polyps removed, both TAs, the largest (1.5cm rectum) contained HGD but appeared completely removed endoscopically. Repeat colonoscopy 12/2015; 16m rectal polyp, TA on path; no recall given her age. 2. Nausea, dyspepsia: EGD JArdis Hughs9/2015 found mild to moderate gastritis, biopsies from stomach showed no H. Pylori, no neoplasm 3. Cholelithiasis:noted on [place]; underwent lap chole 2017; was inflamed per patient. 4. Lymphocytic colitis: 12/2015 colonoscopy for change in bowels, found above polyp. TI was normal. Random colon biopsies showed LC. She was started on budesonide with eventual improvement in her symptoms.      HPI: This is a very pleasant 85year old woman whom I last saw a year or 2 ago.  She was sent by her primary care physician for newly determined iron deficiency anemia. Blood work August 2020 shows ferritin 9.3, this is identically what her ferritin was 6 years ago.   CBC August 2020 shows a hemoglobin of 9.4, MCV 77.2.  9 months ago her hemoglobin was 11.4 and her MCV was 80, 3 years ago her hemoglobin was 13.9 and her MCV was 83  Today she tells me she has been having increasing epigastric abdominal pain, early satiety, unintentional weight loss.  This is been going on for several months at least.  She has not had any overt GI bleeding.  She does not take NSAIDs.  Her brother died of colon cancer.  She started Eliquis about a year ago for atrial fibrillation and since then she has noticed very easy bruising on her skin.  She has not had any overt vaginal, urine or nosebleeding that she is aware of  Chief complaint is iron deficiency  anemia, early satiety, epigastric abdominal pain, weight loss  ROS: complete GI ROS as described in HPI, all other review negative.  Constitutional:  No unintentional weight loss   Past Medical History:  Diagnosis Date  . Anemia, pernicious   . Arthritis    bursitis   . Atrophic gastritis   . B12 deficiency   . CAD (coronary artery disease)   . Carotid artery stenosis   . Diarrhea    on and off for months  . Family history of adverse reaction to anesthesia    extreme nausea brother and sister  . Fibromyalgia   . Gallbladder polyp   . GERD (gastroesophageal reflux disease)   . Heart murmur   . History of hiatal hernia   . History of kidney stones   . HLD (hyperlipidemia)   . HTN (hypertension)   . Hypothyroidism   . Irritable bowel syndrome (IBS)   . [place] arcuate ligament syndrome ([place]   . PVD [place]ipheral vascular disease) [place]   . Tubular adenoma of colon 08/2013   with focal high grade dysplasia    Past Surgical History:  Procedure Laterality Date  . ABDOMINAL HYSTERECTOMY    . APPENDECTOMY    . BIOPSY THYROID    . CHOLECYSTECTOMY N/A 10/24/2014   Procedure: MICROSCOPIC  CHOLECYSTECTOMY;  Surgeon: ARalene Ok MD;  [place]  Service: General;  Laterality: N/A;  . CORONARY ARTERY BYPASS GRAFT  07  . ESOPHAGEAL MANOMETRY N/A 08/02/2012   Procedure: ESOPHAGEAL MANOMETRY (EM);  Surgeon:  Sable Feil, MD;  Location: [facility];  Service: Endoscopy;  Laterality: N/A;  . THYROID LOBECTOMY      Current Outpatient Medications  Medication Sig Dispense Refill  . acetaminophen (TYLENOL) 500 MG tablet Take 500 mg by mouth every 6 (six) hours as needed (pain).    Marland Kitchen amLODipine (NORVASC) 10 MG tablet TAKE 1 TABLET BY MOUTH DAILY 90 tablet 1  . apixaban (ELIQUIS) 2.5 MG TABS tablet Take 1 tablet (2.5 mg total) by mouth 2 (two) times daily. 180 tablet 3  . atorvastatin (LIPITOR) 80 MG tablet TAKE 1 TABLET BY MOUTH AT BEDTIME 90 tablet 3  . azelastine (ASTELIN)  0.1 % nasal spray Place 1 spray into both nostrils 2 (two) times daily as needed for rhinitis or allergies. Use in each nostril as directed    . Calcium Carbonate-Vitamin D (CALTRATE 600+D PO) Take 600 mg by mouth daily with lunch.    . carvedilol (COREG) 6.25 MG tablet TAKE 1/2 TABLET BY MOUTH TWICE A DAY 180 tablet 1  . cyanocobalamin (,VITAMIN B-12,) 1000 MCG/ML injection Inject 1,000 mcg into the muscle every 30 (thirty) days. On or about the end of each month    . Cyanocobalamin (B-12) 1000 MCG/ML KIT cyanocobalamin (vit B-12) 1,000 mcg/mL injection kit  Inject 1 mL every month by intramuscular route.    . fluticasone (FLONASE) 50 MCG/ACT nasal spray Place 1 spray daily as needed into both nostrils for allergies or rhinitis.    . hydrALAZINE (APRESOLINE) 50 MG tablet TAKE ONE TABLET BY MOUTH THREE TIMES A DAY 90 tablet 3  . hyoscyamine (LEVSIN SL) 0.125 MG SL tablet Place 1 tablet (0.125 mg total) under the tongue every 8 (eight) hours as needed. 30 tablet 0  . levothyroxine (SYNTHROID, LEVOTHROID) 50 MCG tablet Take 50 mcg by mouth daily before breakfast.    . losartan (COZAAR) 100 MG tablet TAKE 1 TABLET BY MOUTH DAILY 90 tablet 0  . Multiple Vitamins-Minerals (OCUVITE-LUTEIN PO) PreserVision AREDS 2    . sodium chloride (OCEAN) 0.65 % nasal spray Place 1 spray into both nostrils 3 (three) times daily as needed for congestion.     Marland Kitchen spironolactone (ALDACTONE) 25 MG tablet TAKE 1 TABLET BY MOUTH ONCE DAILY 90 tablet 2  . sucralfate (CARAFATE) 1 GM/10ML suspension TAKE 10 MLS (1 G TOTAL) BY MOUTH 2 (TWO) TIMES DAILY. 420 mL 1  . zoledronic acid (RECLAST) 5 MG/100ML SOLN injection Reclast     No current facility-administered medications for this visit.     Allergies as of 01/04/2019 - Review Complete 01/04/2019  Allergen Reaction Noted  . Actos [pioglitazone] Swelling 10/06/2013  . Sulfonamide derivatives Swelling 01/08/2007  . Codeine Nausea And Vomiting 01/08/2007    Family History   Problem Relation Age of Onset  . Heart disease Mother   . Cholelithiasis Mother   . Prostate cancer Brother   . Colon cancer Brother   . Breast cancer Other        niece - breast cancer  . Diabetes Sister   . Lung cancer Father   . Lung cancer Sister   . Stomach cancer Cousin   . Esophageal cancer Neg Hx     Social History   Socioeconomic History  . Marital status: Married    Spouse name: Not on file  . Number of children: Not on file  . Years of education: 24  . Highest education level: Not on file  Occupational History  . Occupation: retired  Employer: RETIRED  Social Needs  . Financial resource strain: Not on file  . Food insecurity    Worry: Not on file    Inability: Not on file  . Transportation needs    Medical: Not on file    Non-medical: Not on file  Tobacco Use  . Smoking status: Never Smoker  . Smokeless tobacco: Never Used  Substance and Sexual Activity  . Alcohol use: No    Alcohol/week: 0.0 standard drinks  . Drug use: No  . Sexual activity: Not on file  Lifestyle  . Physical activity    Days per week: Not on file    Minutes per session: Not on file  . Stress: Not on file  Relationships  . Social Herbalist on phone: Not on file    Gets together: Not on file    Attends religious service: Not on file    Active member of club or organization: Not on file    Attends meetings of clubs or organizations: Not on file    Relationship status: Not on file  . Intimate partner violence    Fear of current or ex partner: Not on file    Emotionally abused: Not on file    Physically abused: Not on file    Forced sexual activity: Not on file  Other Topics Concern  . Not on file  Social History Narrative   [name], Married. Lives with husband. Had been a very active woman.     Physical Exam: BP (!) 144/68   Pulse 65   Temp 97.8 F (36.6 C) (Temporal)   Ht 5' 3.5" (1.613 m)   Wt 98 lb 12.8 oz (44.8 kg)   BMI 17.23 kg/m  Constitutional:  Pretty thin, noticeably cachectic Psychiatric: alert and oriented x3 Abdomen: soft, nontender, nondistended, no obvious ascites, no peritoneal signs, normal bowel sounds No peripheral edema noted in lower extremities  Assessment and plan: 85 y.o. female with weight loss, early satiety, iron deficiency anemia, epigastric abdominal pain  She certainly needs further testing, I would like to start with CT scan abdomen pelvis.  I am concerned about her early satiety, her mild weight loss and her epigastric abdominal pain, potential underlying neoplasm.  If the CT scan does not explain her symptoms well enough then she will need further testing likely a colonoscopy and upper endoscopy.  If we get to that point she will need to hold her Eliquis for 2 days prior to the invasive testing and we will certainly clear that with her cardiologist to make sure that he feels that is safe.  Please see the "Patient Instructions" section for addition details about the plan.  Owens Loffler, MD [facility] 01/04/2019, 3:57 PM

## 2019-01-05 DIAGNOSIS — H4423 Degenerative myopia, bilateral: Secondary | ICD-10-CM | POA: Diagnosis not present

## 2019-01-05 DIAGNOSIS — H353121 Nonexudative age-related macular degeneration, left eye, early dry stage: Secondary | ICD-10-CM | POA: Diagnosis not present

## 2019-01-05 DIAGNOSIS — H35363 Drusen (degenerative) of macula, bilateral: Secondary | ICD-10-CM | POA: Diagnosis not present

## 2019-01-05 DIAGNOSIS — H35453 Secondary pigmentary degeneration, bilateral: Secondary | ICD-10-CM | POA: Diagnosis not present

## 2019-01-05 DIAGNOSIS — H353112 Nonexudative age-related macular degeneration, right eye, intermediate dry stage: Secondary | ICD-10-CM | POA: Diagnosis not present

## 2019-01-21 ENCOUNTER — Telehealth: Payer: Self-pay | Admitting: Gastroenterology

## 2019-01-21 NOTE — Telephone Encounter (Signed)
Pt inquired about liver function lab results.

## 2019-01-21 NOTE — Telephone Encounter (Signed)
The pt has been advised that her BUN and CREAT were normal and she can proceed with CT as scheduled.

## 2019-01-24 ENCOUNTER — Other Ambulatory Visit: Payer: Self-pay | Admitting: Family Medicine

## 2019-01-24 ENCOUNTER — Other Ambulatory Visit: Payer: Self-pay

## 2019-01-24 ENCOUNTER — Ambulatory Visit (INDEPENDENT_AMBULATORY_CARE_PROVIDER_SITE_OTHER): Payer: Medicare Other | Admitting: *Deleted

## 2019-01-24 DIAGNOSIS — E538 Deficiency of other specified B group vitamins: Secondary | ICD-10-CM | POA: Diagnosis not present

## 2019-01-24 DIAGNOSIS — Z23 Encounter for immunization: Secondary | ICD-10-CM

## 2019-01-24 MED ORDER — CYANOCOBALAMIN 1000 MCG/ML IJ SOLN
1000.0000 ug | Freq: Once | INTRAMUSCULAR | Status: AC
  Administered 2019-01-24: 1000 ug via INTRAMUSCULAR

## 2019-01-24 NOTE — Progress Notes (Signed)
Patient in clinic for B-12 injection and high dose flu vaccine. Both injections administered with no reaction.

## 2019-01-25 ENCOUNTER — Ambulatory Visit (INDEPENDENT_AMBULATORY_CARE_PROVIDER_SITE_OTHER)
Admission: RE | Admit: 2019-01-25 | Discharge: 2019-01-25 | Disposition: A | Discharge status: Home / Self Care | Payer: Medicare Other | Source: Ambulatory Visit | Attending: Gastroenterology | Admitting: Gastroenterology

## 2019-01-25 DIAGNOSIS — R109 Unspecified abdominal pain: Secondary | ICD-10-CM | POA: Diagnosis not present

## 2019-01-25 DIAGNOSIS — R1013 Epigastric pain: Secondary | ICD-10-CM | POA: Diagnosis not present

## 2019-01-25 DIAGNOSIS — R634 Abnormal weight loss: Secondary | ICD-10-CM

## 2019-01-25 DIAGNOSIS — D649 Anemia, unspecified: Secondary | ICD-10-CM

## 2019-01-25 MED ORDER — IOHEXOL 300 MG/ML  SOLN
100.0000 mL | Freq: Once | INTRAMUSCULAR | Status: AC | PRN
  Administered 2019-01-25: 100 mL via INTRAVENOUS

## 2019-01-26 ENCOUNTER — Telehealth: Payer: Self-pay

## 2019-01-26 NOTE — Telephone Encounter (Signed)
Please comment on eliquis. 

## 2019-01-26 NOTE — Telephone Encounter (Signed)
[facility] Pre-operative Risk Assessment     Request for surgical clearance:     Endoscopy Procedure  What type of surgery is being performed?     EGD  When is this surgery scheduled?     02/11/19  What type of clearance is required ?   Pharmacy  Are there any medications that need to be held prior to surgery and how long? Eliquis   Practice name and name of physician performing surgery?      [facility]  What is your office phone and fax number?      Phone- [phone]  [phone]  Anesthesia type (None, local, MAC, general) ?       MAC

## 2019-01-27 NOTE — Telephone Encounter (Signed)
The pt has been advised she can hold eliquis 2 days prior to procedure. She will keep appts for previsit and EGD. The pt has been advised of the information and verbalized understanding.

## 2019-01-27 NOTE — Telephone Encounter (Addendum)
Patient with diagnosis of afib on Eliquis for anticoagulation.    Procedure: EGD Date of procedure: 02/11/2019  CHADS2-VASc score of  5 ( HTN, AGE, CAD, AGE, female)  CrCl 31.5 ml/min  Per office protocol, patient can hold Eliquis for 1-2 days prior to procedure.

## 2019-02-03 DIAGNOSIS — H353132 Nonexudative age-related macular degeneration, bilateral, intermediate dry stage: Secondary | ICD-10-CM | POA: Diagnosis not present

## 2019-02-03 DIAGNOSIS — H5213 Myopia, bilateral: Secondary | ICD-10-CM | POA: Diagnosis not present

## 2019-02-03 DIAGNOSIS — H04123 Dry eye syndrome of bilateral lacrimal glands: Secondary | ICD-10-CM | POA: Diagnosis not present

## 2019-02-03 DIAGNOSIS — H43813 Vitreous degeneration, bilateral: Secondary | ICD-10-CM | POA: Diagnosis not present

## 2019-02-07 ENCOUNTER — Other Ambulatory Visit: Payer: Self-pay

## 2019-02-07 ENCOUNTER — Ambulatory Visit: Payer: Medicare Other | Admitting: *Deleted

## 2019-02-07 VITALS — Temp 97.4°F | Ht 63.0 in | Wt 100.0 lb

## 2019-02-07 DIAGNOSIS — Z1159 Encounter for screening for other viral diseases: Secondary | ICD-10-CM

## 2019-02-07 NOTE — Progress Notes (Signed)
No egg or soy allergy known to patient  No issues with past sedation with any surgeries  or procedures, no intubation problems  No diet pills per patient No home 02 use per patient  No blood thinners per patient  Pt denies issues with constipation  No A fib or A flutter  EMMI video sent to pt's e mail   covid screening on 02/08/2019 at 1430  Due to the COVID-19 pandemic we are asking patients to follow these guidelines. Please only bring one care partner. Please be aware that your care partner may wait in the car in the parking lot or if they feel like they will be too hot to wait in the car, they may wait in the lobby on the 4th floor. All care partners are required to wear a mask the entire time (we do not have any that we can provide them), they need to practice social distancing, and we will do a Covid check for all patient's and care partners when you arrive. Also we will check their temperature and your temperature. If the care partner waits in their car they need to stay in the parking lot the entire time and we will call them on their cell phone when the patient is ready for discharge so they can bring the car to the front of the building. Also all patient's will need to wear a mask into building.

## 2019-02-08 DIAGNOSIS — Z1159 Encounter for screening for other viral diseases: Secondary | ICD-10-CM | POA: Diagnosis not present

## 2019-02-08 LAB — SARS CORONAVIRUS 2 (TAT 6-24 HRS): SARS Coronavirus 2: NEGATIVE

## 2019-02-10 ENCOUNTER — Telehealth: Payer: Self-pay

## 2019-02-10 NOTE — Telephone Encounter (Signed)
Covid-19 screening questions   Do you now or have you had a fever in the last 14 days?  Do you have any respiratory symptoms of shortness of breath or cough now or in the last 14 days?  Do you have any family members or close contacts with diagnosed or suspected Covid-19 in the past 14 days?  Have you been tested for Covid-19 and found to be positive?       

## 2019-02-11 ENCOUNTER — Encounter: Payer: Self-pay | Admitting: Gastroenterology

## 2019-02-11 ENCOUNTER — Other Ambulatory Visit: Payer: Self-pay

## 2019-02-11 ENCOUNTER — Other Ambulatory Visit: Payer: Self-pay | Admitting: Gastroenterology

## 2019-02-11 ENCOUNTER — Ambulatory Visit (AMBULATORY_SURGERY_CENTER): Payer: Medicare Other | Admitting: Gastroenterology

## 2019-02-11 VITALS — BP 114/51 | HR 65 | Temp 98.1°F | Resp 12 | Ht 63.0 in | Wt 100.0 lb

## 2019-02-11 DIAGNOSIS — K2950 Unspecified chronic gastritis without bleeding: Secondary | ICD-10-CM | POA: Diagnosis not present

## 2019-02-11 DIAGNOSIS — D649 Anemia, unspecified: Secondary | ICD-10-CM | POA: Diagnosis not present

## 2019-02-11 DIAGNOSIS — K317 Polyp of stomach and duodenum: Secondary | ICD-10-CM

## 2019-02-11 DIAGNOSIS — K389 Disease of appendix, unspecified: Secondary | ICD-10-CM

## 2019-02-11 MED ORDER — SODIUM CHLORIDE 0.9 % IV SOLN: 500.0000 mL | Freq: Once | INTRAVENOUS | Status: DC

## 2019-02-11 NOTE — Op Note (Signed)
[facility] Patient Name: [name] Procedure Date: 02/11/2019 2:00 PM MRN: [id number] Endoscopist: Milus Banister , MD Age: 85 Referring MD:  Date of Birth: 01/04/1933 Gender: Female Account #: [id number] Procedure:                Upper GI endoscopy Indications:              Iron deficiency anemia, early satiety, weight loss.                            CT abd/pelvis recently without clear etiology Medicines:                Monitored Anesthesia Care Procedure:                Pre-Anesthesia Assessment:                           - Prior to the procedure, a History and Physical                            was performed, and patient medications and                            allergies were reviewed. The patient's tolerance of                            previous anesthesia was also reviewed. The risks                            and benefits of the procedure and the sedation                            options and risks were discussed with the patient.                            All questions were answered, and informed consent                            was obtained. Prior Anticoagulants: The patient has                            taken Eliquis (apixaban), last dose was 2 days                            prior to procedure. ASA Grade Assessment: III - A                            patient with severe systemic disease. After                            reviewing the risks and benefits, the patient was                            deemed in satisfactory condition to undergo the  procedure.                           After obtaining informed consent, the endoscope was                            passed under direct vision. Throughout the                            procedure, the patient's blood pressure, pulse, and                            oxygen saturations were monitored continuously. The                            Endoscope was introduced through the  mouth, and                            advanced to the second part of duodenum. The upper                            GI endoscopy was accomplished without difficulty.                            The patient tolerated the procedure well. Scope In: Scope Out: Findings:                 Mild inflammation characterized by erythema,                            friability and granularity was found in the gastric                            antrum. Biopsies were taken with a cold forceps for                            histology.                           A pendunculated, friable, fleshy polyp was found in                            the gastric cardia. This was 1.5 to 2cm and I                            biopsied it with forceps.                           The exam was otherwise without abnormality. Complications:            No immediate complications. Estimated blood loss:                            None. Estimated Blood Loss:     Estimated blood loss: none. Impression:               -  Mild gastritis, biopsied to check for H. pylori.                           - A pendunculated, friable, soft polyp was found in                            the gastric cardia. This was 1.5 to 2cm and I                            biopsied it with forceps. This MAY be causing her                            anemia. Pending path results, I will likely repeat                            EGD at the hospital for polypectomy. Recommendation:           - Patient has a contact number available for                            emergencies. The signs and symptoms of potential                            delayed complications were discussed with the                            patient. Return to normal activities tomorrow.                            Written discharge instructions were provided to the                            patient.                           - Resume previous diet.                           - Continue present  medications. OK to resume                            eliquis today.                           - Await pathology results. Milus Banister, MD 02/11/2019 2:27:29 PM This report has been signed electronically.

## 2019-02-11 NOTE — Patient Instructions (Signed)
Resume eliquis today.    YOU HAD AN ENDOSCOPIC PROCEDURE TODAY AT [facility]:   Refer to the procedure report that was given to you for any specific questions about what was found during the examination.  If the procedure report does not answer your questions, please call your gastroenterologist to clarify.  If you requested that your care partner not be given the details of your procedure findings, then the procedure report has been included in a sealed envelope for you to review at your convenience later.  YOU SHOULD EXPECT: Some feelings of bloating in the abdomen. Passage of more gas than usual.  Walking can help get rid of the air that was put into your GI tract during the procedure and reduce the bloating. If you had a lower endoscopy (such as a colonoscopy or flexible sigmoidoscopy) you may notice spotting of blood in your stool or on the toilet paper. If you underwent a bowel prep for your procedure, you may not have a normal bowel movement for a few days.  Please Note:  You might notice some irritation and congestion in your nose or some drainage.  This is from the oxygen used during your procedure.  There is no need for concern and it should clear up in a day or so.  SYMPTOMS TO REPORT IMMEDIATELY:   Following upper endoscopy (EGD)  Vomiting of blood or coffee ground material  New chest pain or pain under the shoulder blades  Painful or persistently difficult swallowing  New shortness of breath  Fever of 100F or higher  Black, tarry-looking stools  For urgent or emergent issues, a gastroenterologist can be reached at any hour by calling [phone].   DIET:  We do recommend a small meal at first, but then you may proceed to your regular diet.  Drink plenty of fluids but you should avoid alcoholic beverages for 24 hours.  ACTIVITY:  You should plan to take it easy for the rest of today and you should NOT DRIVE or use heavy machinery until tomorrow (because of  the sedation medicines used during the test).    FOLLOW UP: Our staff will call the number listed on your records 48-72 hours following your procedure to check on you and address any questions or concerns that you may have regarding the information given to you following your procedure. If we do not reach you, we will leave a message.  We will attempt to reach you two times.  During this call, we will ask if you have developed any symptoms of COVID 19. If you develop any symptoms (ie: fever, flu-like symptoms, shortness of breath, cough etc.) before then, please call [phone].  If you test positive for Covid 19 in the 2 weeks post procedure, please call and report this information to [place].    If any biopsies were taken you will be contacted by phone or by letter within the next 1-3 weeks.  Please call us at [phone] if you have not heard about the biopsies in 3 weeks.    SIGNATURES/CONFIDENTIALITY: You and/or your care partner have signed paperwork which will be entered into your electronic medical record.  These signatures attest to the fact that that the information above on your After Visit Summary has been reviewed and is understood.  Full responsibility of the confidentiality of this discharge information lies with you and/or your care-partner.

## 2019-02-11 NOTE — Progress Notes (Signed)
tmep JB  v/s CW  I have reviewed the patient's medical history in detail and updated the computerized patient record.

## 2019-02-11 NOTE — Progress Notes (Signed)
Called to room to assist during endoscopic procedure.  Patient ID and intended procedure confirmed with present staff. Received instructions for my participation in the procedure from the performing physician.  

## 2019-02-11 NOTE — Progress Notes (Signed)
Report given to PACU, vss 

## 2019-02-14 ENCOUNTER — Encounter: Payer: Self-pay | Admitting: Cardiovascular Disease

## 2019-02-14 ENCOUNTER — Ambulatory Visit: Payer: Medicare Other | Admitting: Cardiovascular Disease

## 2019-02-14 ENCOUNTER — Other Ambulatory Visit: Payer: Self-pay

## 2019-02-14 VITALS — BP 126/58 | HR 62 | Ht 63.0 in | Wt 101.4 lb

## 2019-02-14 DIAGNOSIS — I2510 Atherosclerotic heart disease of native coronary artery without angina pectoris: Secondary | ICD-10-CM | POA: Diagnosis not present

## 2019-02-14 DIAGNOSIS — I5032 Chronic diastolic (congestive) heart failure: Secondary | ICD-10-CM

## 2019-02-14 DIAGNOSIS — R42 Dizziness and giddiness: Secondary | ICD-10-CM | POA: Diagnosis not present

## 2019-02-14 DIAGNOSIS — I361 Nonrheumatic tricuspid (valve) insufficiency: Secondary | ICD-10-CM | POA: Diagnosis not present

## 2019-02-14 DIAGNOSIS — I480 Paroxysmal atrial fibrillation: Secondary | ICD-10-CM | POA: Diagnosis not present

## 2019-02-14 DIAGNOSIS — R55 Syncope and collapse: Secondary | ICD-10-CM

## 2019-02-14 NOTE — Progress Notes (Signed)
Cardiology Office Note:    Date:  02/14/2019   ID:  [name], DOB 1933/02/21, MRN [id number]  PCP:  [name], MD  Cardiologist:  Sherren Mocha, MD  Electrophysiologist:  None   Referring MD: [name], MD   Chief Complaint  Patient presents with  . Coronary Artery Disease    History of Present Illness:    [name] is a 85 y.o. female with a hx of coronary artery disease status post CABG in 2007.  The patient also has chronic anemia, hypertension, moderate mitral regurgitation, and asymptomatic carotid stenosis.  She was diagnosed with atrial fibrillation in 2019 during a GI illness, but spontaneously was noted to be back in sinus rhythm at follow-up. She has recently been under evaluation by Dr Ardis Hughs. She has undergone CT and EGD studies. The CT of the abdomen and pelvis showed no significant abnormalities. Her EGD showed gastritis and a polyp with plans noted to do a polypectomy in the near future.   She has a few cardiac-related complaints. She complains of palpitations that occur when she lies down to sleep at night or when she relaxes. There are no associated symptoms. She also feels dizzy when she first stands up.  She stands for a few minutes before walking. She had one episode of syncope several months ago. No injury. She denies chest pain, dyspnea,  orthopnea, or PND.  She complains of swelling in her feet at the end of the day.  She also has some numbness in the feet.  Past Medical History:  Diagnosis Date  . Allergy   . Anemia, pernicious   . Arthritis    bursitis   . Atrophic gastritis   . B12 deficiency   . CAD (coronary artery disease)   . Carotid artery stenosis   . Cataract   . Diarrhea    on and off for months  . Family history of adverse reaction to anesthesia    extreme nausea brother and sister  . Fibromyalgia   . Gallbladder polyp   . GERD (gastroesophageal reflux disease)   . Heart murmur   . History of hiatal hernia    . History of kidney stones   . HLD (hyperlipidemia)   . HTN (hypertension)   . Hypothyroidism   . Irritable bowel syndrome (IBS)   . Median arcuate ligament syndrome ([place])   . Osteoporosis   . PVD (peripheral vascular disease) ([place])   . Tubular adenoma of colon 08/2013   with focal high grade dysplasia    Past Surgical History:  Procedure Laterality Date  . ABDOMINAL HYSTERECTOMY    . APPENDECTOMY    . BIOPSY THYROID    . cataract surgery    . CHOLECYSTECTOMY N/A 10/24/2014   Procedure: MICROSCOPIC  CHOLECYSTECTOMY;  Surgeon: Ralene Ok, MD;  Location: [facility];  Service: General;  Laterality: N/A;  . COLONOSCOPY    . CORONARY ARTERY BYPASS GRAFT  07  . ESOPHAGEAL MANOMETRY N/A 08/02/2012   Procedure: ESOPHAGEAL MANOMETRY (EM);  Surgeon: Sable Feil, MD;  Location: [facility];  Service: Endoscopy;  Laterality: N/A;  . THYROID LOBECTOMY    . UPPER GASTROINTESTINAL ENDOSCOPY      Current Medications: Current Meds  Medication Sig  . acetaminophen (TYLENOL) 500 MG tablet Take 500 mg by mouth every 6 (six) hours as needed (pain).  Marland Kitchen amLODipine (NORVASC) 10 MG tablet TAKE 1 TABLET BY MOUTH DAILY  . apixaban (ELIQUIS) 2.5 MG TABS tablet Take 1  tablet (2.5 mg total) by mouth 2 (two) times daily.  Marland Kitchen atorvastatin (LIPITOR) 80 MG tablet TAKE 1 TABLET BY MOUTH AT BEDTIME  . azelastine (ASTELIN) 0.1 % nasal spray Place 1 spray into both nostrils 2 (two) times daily as needed for rhinitis or allergies. Use in each nostril as directed  . Calcium Carbonate-Vitamin D (CALTRATE 600+D PO) Take 600 mg by mouth daily with lunch.  . carvedilol (COREG) 6.25 MG tablet TAKE 1/2 TABLET BY MOUTH TWICE A DAY  . fluticasone (FLONASE) 50 MCG/ACT nasal spray Place 1 spray daily as needed into both nostrils for allergies or rhinitis.  . hydrALAZINE (APRESOLINE) 50 MG tablet TAKE ONE TABLET BY MOUTH THREE TIMES A DAY  . hyoscyamine (LEVSIN SL) 0.125 MG SL tablet Place 1 tablet (0.125 mg total) under  the tongue every 8 (eight) hours as needed.  Marland Kitchen levothyroxine (SYNTHROID, LEVOTHROID) 50 MCG tablet Take 50 mcg by mouth daily before breakfast.  . losartan (COZAAR) 100 MG tablet TAKE 1 TABLET BY MOUTH DAILY  . Multiple Vitamins-Minerals (OCUVITE-LUTEIN PO) PreserVision AREDS 2  . sodium chloride (OCEAN) 0.65 % nasal spray Place 1 spray into both nostrils 3 (three) times daily as needed for congestion.   Marland Kitchen spironolactone (ALDACTONE) 25 MG tablet TAKE 1 TABLET BY MOUTH ONCE DAILY  . sucralfate (CARAFATE) 1 GM/10ML suspension TAKE 10 MLS (1 G TOTAL) BY MOUTH 2 (TWO) TIMES DAILY.  Marland Kitchen zoledronic acid (RECLAST) 5 MG/100ML SOLN injection Reclast     Allergies:   Actos [pioglitazone], Sulfonamide derivatives, and Codeine   Social History   Socioeconomic History  . Marital status: Married    Spouse name: Not on file  . Number of children: Not on file  . Years of education: 84  . Highest education level: Not on file  Occupational History  . Occupation: retired    Fish farm manager: RETIRED  Social Needs  . Financial resource strain: Not on file  . Food insecurity    Worry: Not on file    Inability: Not on file  . Transportation needs    Medical: Not on file    Non-medical: Not on file  Tobacco Use  . Smoking status: Never Smoker  . Smokeless tobacco: Never Used  Substance and Sexual Activity  . Alcohol use: No    Alcohol/week: 0.0 standard drinks  . Drug use: No  . Sexual activity: Not on file  Lifestyle  . Physical activity    Days per week: Not on file    Minutes per session: Not on file  . Stress: Not on file  Relationships  . Social Herbalist on phone: Not on file    Gets together: Not on file    Attends religious service: Not on file    Active member of club or organization: Not on file    Attends meetings of clubs or organizations: Not on file    Relationship status: Not on file  Other Topics Concern  . Not on file  Social History Narrative   [name], Married. Lives  with husband. Had been a very active woman.     Family History: The patient's family history includes Breast cancer in an other family member; Cholelithiasis in her mother; Colon cancer in her brother; Diabetes in her sister; Heart disease in her mother; Lung cancer in her father and sister; Prostate cancer in her brother; Stomach cancer in her cousin. There is no history of Esophageal cancer or Rectal cancer.  ROS:   Please  see the history of present illness.    All other systems reviewed and are negative.  EKGs/Labs/Other Studies Reviewed:    The following studies were reviewed today: Echo 04/12/2018: Study Conclusions  - Left ventricle: The cavity size was normal. Systolic function was   normal. The estimated ejection fraction was in the range of 55%   to 60%. Wall motion was normal; there were no regional wall   motion abnormalities. Features are consistent with a pseudonormal   left ventricular filling pattern, with concomitant abnormal   relaxation and increased filling pressure (grade 2 diastolic   dysfunction). Doppler parameters are consistent with elevated   ventricular end-diastolic filling pressure. - Aortic valve: There was moderate regurgitation. - Mitral valve: There was moderate regurgitation. - Left atrium: The atrium was moderately dilated. - Right ventricle: The cavity size was normal. Wall thickness was   normal. Systolic function was normal. - Right atrium: The atrium was mildly dilated. - Tricuspid valve: There was moderate regurgitation. - Pulmonary arteries: Systolic pressure was moderately increased.   PA peak pressure: 63 mm Hg (S). - Pericardium, extracardiac: There was no pericardial effusion.  Impressions:  - When compared to the prior study from 03/26/2017, tricuspid   regurgitation is now severe, there is new moderate pulmonary   hypertension.  EKG:  EKG is not ordered today.   Recent Labs: 03/03/2018: Magnesium 2.3 04/05/2018: TSH 4.390  06/25/2018: NT-Pro BNP 459 11/29/2018: ALT 19; Hemoglobin 9.4; Platelets 195.0; Potassium 3.9; Sodium 140 01/04/2019: BUN 19; Creatinine, Ser 0.94  Recent Lipid Panel    Component Value Date/Time   CHOL 135 11/18/2016   TRIG 54 11/18/2016   TRIG 85 02/05/2006 0818   HDL 69.20 11/22/2014 0919   CHOLHDL 2 11/22/2014 0919   VLDL 10.6 11/22/2014 0919   LDLCALC 66 11/22/2014 0919    Physical Exam:    VS:  BP (!) 126/58   Pulse 62   Ht 5\' 3"  (1.6 m)   Wt 101 lb 6.4 oz (46 kg)   SpO2 99%   BMI 17.96 kg/m     Wt Readings from Last 3 Encounters:  02/14/19 101 lb 6.4 oz (46 kg)  02/11/19 100 lb (45.4 kg)  02/07/19 100 lb (45.4 kg)     GEN: Thin elderly woman in no acute distress HEENT: Normal NECK: No JVD; No carotid bruits LYMPHATICS: No lymphadenopathy CARDIAC: RRR, 2/6 systolic murmur at the LUSB RESPIRATORY:  Clear to auscultation without rales, wheezing or rhonchi  ABDOMEN: Soft, non-tender, non-distended MUSCULOSKELETAL:  1+ edema right ankle and foot, no edema on the left; No deformity  SKIN: Warm and dry NEUROLOGIC:  Alert and oriented x 3 PSYCHIATRIC:  Normal affect   ASSESSMENT:    1. Chronic diastolic heart failure ([place])   2. Coronary artery disease involving native coronary artery of native heart without angina pectoris   3. Paroxysmal atrial fibrillation (H[facility])   4. Nonrheumatic tricuspid valve regurgitation   5. Postural dizziness with near syncope    PLAN:    In order of problems listed above:  1. Appear stable with no functional limitation from chest pain or shortness of breath.  Medical program reviewed. 2. No anginal symptoms.  Treated with apixaban for paroxysmal atrial fibrillation so no antiplatelet therapy as prescribed.  Otherwise on guideline directed medical therapy with high intensity statin drug and a beta-blocker. 3. Appears to be maintaining sinus rhythm.  Anticoagulated with dose adjusted apixaban 2.5 mg twice daily. 4. Physical exam stable.   Last  echo reviewed.  Will repeat an echocardiogram at the time of her 37-month follow-up. 5. The patient's blood pressure may be overtreated.  I asked her to discontinue hydralazine.  She will stay on carvedilol, losartan, amlodipine, and Spironolactone.  Most recent metabolic panel reviewed.  I asked her to monitor her blood pressure a few times per week and call if she is getting any elevated readings.   Medication Adjustments/Labs and Tests Ordered: Current medicines are reviewed at length with the patient today.  Concerns regarding medicines are outlined above.  No orders of the defined types were placed in this encounter.  No orders of the defined types were placed in this encounter.   There are no Patient Instructions on file for this visit.   Signed, Sherren Mocha, MD  02/14/2019 8:14 AM    [place]

## 2019-02-14 NOTE — Patient Instructions (Signed)
Medication Instructions:  1) STOP HYDRALAZINE  Testing/Procedures: Your provider has requested that you have an echocardiogram in 6 months. Echocardiography is a painless test that uses sound waves to create images of your heart. It provides your doctor with information about the size and shape of your heart and how well your heart's chambers and valves are working. This procedure takes approximately one hour. There are no restrictions for this procedure.  Follow-Up: You will be called to arrange your echo and visit with Dr. Burt Knack when his schedule opens for April, 2021.

## 2019-02-15 ENCOUNTER — Telehealth: Payer: Self-pay

## 2019-02-15 NOTE — Telephone Encounter (Signed)
  Follow up Call-  Call back number 02/11/2019  Post procedure Call Back phone  # [phone]  Permission to leave phone message Yes  Some recent data might be hidden     Patient questions:  Do you have a fever, pain , or abdominal swelling? No. Pain Score  0 *  Have you tolerated food without any problems? Yes.    Have you been able to return to your normal activities? Yes.    Do you have any questions about your discharge instructions: Diet   No. Medications  No. Follow up visit  No.  Do you have questions or concerns about your Care? No.  Actions: * If pain score is 4 or above: No action needed, pain <4.  1. Have you developed a fever since your procedure? no  2.   Have you had an respiratory symptoms (SOB or cough) since your procedure? no  3.   Have you tested positive for COVID 19 since your procedure no  4.   Have you had any family members/close contacts diagnosed with the COVID 19 since your procedure?  no   If yes to any of these questions please route to Joylene John, RN and Alphonsa Gin, Therapist, sports.

## 2019-02-22 ENCOUNTER — Other Ambulatory Visit: Payer: Self-pay

## 2019-02-22 ENCOUNTER — Ambulatory Visit (INDEPENDENT_AMBULATORY_CARE_PROVIDER_SITE_OTHER): Payer: Medicare Other | Admitting: Family Medicine

## 2019-02-22 ENCOUNTER — Encounter: Payer: Self-pay | Admitting: Family Medicine

## 2019-02-22 VITALS — BP 128/56 | HR 64 | Temp 97.4°F | Ht 63.0 in | Wt 101.5 lb

## 2019-02-22 DIAGNOSIS — M5416 Radiculopathy, lumbar region: Secondary | ICD-10-CM | POA: Diagnosis not present

## 2019-02-22 DIAGNOSIS — E538 Deficiency of other specified B group vitamins: Secondary | ICD-10-CM | POA: Diagnosis not present

## 2019-02-22 MED ORDER — TRAMADOL HCL 50 MG PO TABS: 50.0000 mg | ORAL_TABLET | Freq: Four times a day (QID) | ORAL | 1 refills | Status: AC | PRN

## 2019-02-22 MED ORDER — CYANOCOBALAMIN 1000 MCG/ML IJ SOLN
1000.0000 ug | Freq: Once | INTRAMUSCULAR | Status: AC
  Administered 2019-02-22: 1000 ug via INTRAMUSCULAR

## 2019-02-22 NOTE — Patient Instructions (Signed)
Lumbosacral Radiculopathy Lumbosacral radiculopathy is a condition that involves the spinal nerves and nerve roots in the low back and bottom of the spine. The condition develops when these nerves and nerve roots move out of place or become inflamed and cause symptoms. What are the causes? This condition may be caused by:  Pressure from a disk that bulges out of place (herniated disk). A disk is a plate of soft cartilage that separates bones in the spine.  Disk changes that occur with age (disk degeneration).  A narrowing of the bones of the lower back (spinal stenosis).  A tumor.  An infection.  An injury that places sudden pressure on the disks that cushion the bones of your lower spine. What increases the risk? You are more likely to develop this condition if:  You are a female who is 30-50 years old.  You are a female who is 50-60 years old.  You use improper technique when lifting things.  You are overweight or live a sedentary lifestyle.  Your work requires frequent lifting.  You smoke.  You do repetitive activities that strain the spine. What are the signs or symptoms? Symptoms of this condition include:  Pain that goes down from your back into your legs (sciatica), usually on one side of the body. This is the most common symptom. The pain may be worse with sitting, coughing, or sneezing.  Pain and numbness in your legs.  Muscle weakness.  Tingling.  Loss of bladder control or bowel control. How is this diagnosed? This condition may be diagnosed based on:  Your symptoms and medical history.  A physical exam. If the pain is lasting, you may have tests, such as:  MRI scan.  X-ray.  CT scan.  A type of X-ray used to examine the spinal canal after injecting a dye into your spine (myelogram).  A test to measure how electrical impulses move through a nerve (nerve conduction study). How is this treated? Treatment may depend on the cause of the condition and  may include:  Working with a physical therapist.  Taking pain medicine.  Applying heat and ice to affected areas.  Doing stretches to improve flexibility.  Doing exercises to strengthen back muscles.  Having chiropractic spinal manipulation.  Using transcutaneous electrical nerve stimulation (TENS) therapy.  Getting a steroid injection in the spine. In some cases, no treatment is needed. If the condition is long-lasting (chronic), or if symptoms are severe, treatment may involve surgery or lifestyle changes, such as following a weight-loss plan. Follow these instructions at home: Activity  Avoid bending and other activities that make the problem worse.  Maintain a proper position when standing or sitting: ? When standing, keep your upper back and neck straight, with your shoulders pulled back. Avoid slouching. ? When sitting, keep your back straight and relax your shoulders. Do not round your shoulders or pull them backward.  Do not sit or stand in one place for long periods of time.  Take brief periods of rest throughout the day. This will reduce your pain. It is usually better to rest by lying down or standing, not sitting.  When you are resting for longer periods, mix in some mild activity or stretching between periods of rest. This will help to prevent stiffness and pain.  Get regular exercise. Ask your health care provider what activities are safe for you. If you were shown how to do any exercises or stretches, do them as directed by your health care provider.  Do   not lift anything that is heavier than 10 lb (4.5 kg) or the limit that you are told by your health care provider. Always use proper lifting technique, which includes: ? Bending your knees. ? Keeping the load close to your body. ? Avoiding twisting. Managing pain  If directed, put ice on the affected area: ? Put ice in a plastic bag. ? Place a towel between your skin and the bag. ? Leave the ice on for 20  minutes, 2-3 times a day.  If directed, apply heat to the affected area as often as told by your health care provider. Use the heat source that your health care provider recommends, such as a moist heat pack or a heating pad. ? Place a towel between your skin and the heat source. ? Leave the heat on for 20-30 minutes. ? Remove the heat if your skin turns bright red. This is especially important if you are unable to feel pain, heat, or cold. You may have a greater risk of getting burned.  Take over-the-counter and prescription medicines only as told by your health care provider. General instructions  Sleep on a firm mattress in a comfortable position. Try lying on your side with your knees slightly bent. If you lie on your back, put a pillow under your knees.  Do not drive or use heavy machinery while taking prescription pain medicine.  If your health care provider prescribed a diet or exercise program, follow it as directed.  Keep all follow-up visits as told by your health care provider. This is important. Contact a health care provider if:  Your pain does not improve over time, even when taking pain medicines. Get help right away if:  You develop severe pain.  Your pain suddenly gets worse.  You develop increasing weakness in your legs.  You lose the ability to control your bladder or bowel.  You have difficulty walking or balancing.  You have a fever. Summary  Lumbosacral radiculopathy is a condition that occurs when the spinal nerves and nerve roots in the lower part of the spine move out of place or become inflamed and cause symptoms.  Symptoms include pain, numbness, and tingling that go down from your back into your legs (sciatica), muscle weakness, and loss of bladder control or bowel control.  If directed, apply ice or heat to the affected area as told by your health care provider.  Follow instructions about activity, rest, and proper lifting technique. This  information is not intended to replace advice given to you by your health care provider. Make sure you discuss any questions you have with your health care provider. Document Released: 04/14/2005 Document Revised: 04/02/2017 Document Reviewed: 04/02/2017 [employer]vier Patient Education  2020 [employer]  

## 2019-02-22 NOTE — Progress Notes (Signed)
Subjective:     Patient ID: [name], female   DOB: [birth year], 85 y.o.   MRN: [id number]  HPI [name] seen with right hip and right lower extremity pain.  She has known history of radiculitis issues involving cervical spine and lumbar spine.  She states she had MRI per neurosurgery last year.  We saw her note from surgeon that summarized her findings and she had multilevel disc disease with disc degeneration worse at L4-L5.  She states on Saturday she started having increasing right lateral hip pain and lower lumbar pain radiating down to the leg.  No numbness.  No weakness.  Pain was worse with walking.  She had tremendous difficulty walking Sunday.  No recent injury.  No urine or stool incontinence.  No fevers or chills.  Known history of lumbar stenosis.  Pain much improved at this time.  When she was seen by neurosurgeon over the summer she had epidural x2 without much improvement.  They had proposed possible surgery.  She took some Tylenol which has helped today.  She has history of B12 deficiency.  She is requesting B12 injection today.  Past Medical History:  Diagnosis Date  . Allergy   . Anemia, pernicious   . Arthritis    bursitis   . Atrophic gastritis   . B12 deficiency   . CAD (coronary artery disease)   . Carotid artery stenosis   . Cataract   . Diarrhea    on and off for months  . Family history of adverse reaction to anesthesia    extreme nausea brother and sister  . Fibromyalgia   . Gallbladder polyp   . GERD (gastroesophageal reflux disease)   . Heart murmur   . History of hiatal hernia   . History of kidney stones   . HLD (hyperlipidemia)   . HTN (hypertension)   . Hypothyroidism   . Irritable bowel syndrome (IBS)   . Median arcuate ligament syndrome ([place])   . Osteoporosis   . PVD (peripheral vascular disease) ([place])   . Tubular adenoma of colon 08/2013   with focal high grade dysplasia   Past Surgical History:  Procedure Laterality Date  .  ABDOMINAL HYSTERECTOMY    . APPENDECTOMY    . BIOPSY THYROID    . cataract surgery    . CHOLECYSTECTOMY N/A 10/24/2014   Procedure: MICROSCOPIC  CHOLECYSTECTOMY;  Surgeon: Ralene Ok, MD;  Location: [place];  Service: General;  Laterality: N/A;  . COLONOSCOPY    . CORONARY ARTERY BYPASS GRAFT  07  . ESOPHAGEAL MANOMETRY N/A 08/02/2012   Procedure: ESOPHAGEAL MANOMETRY (EM);  Surgeon: Sable Feil, MD;  [facility];  Service: Endoscopy;  Laterality: N/A;  . THYROID LOBECTOMY    . UPPER GASTROINTESTINAL ENDOSCOPY      reports that she has never smoked. She has never used smokeless tobacco. She reports that she does not drink alcohol or use drugs. family history includes Breast cancer in an other family member; Cholelithiasis in her mother; Colon cancer in her brother; Diabetes in her sister; Heart disease in her mother; Lung cancer in her father and sister; Prostate cancer in her brother; Stomach cancer in her cousin. Allergies  Allergen Reactions  . Actos [Pioglitazone] Swelling    Swelling  . Sulfonamide Derivatives Swelling  . Codeine Nausea And Vomiting  ]'   Review of Systems  Constitutional: Negative for appetite change, chills, fever and unexpected weight change.  Respiratory: Negative for cough and shortness of  breath.   Cardiovascular: Negative for chest pain.  Gastrointestinal: Negative for abdominal pain.  Musculoskeletal: Positive for back pain.       Objective:   Physical Exam Vitals signs reviewed.  Cardiovascular:     Rate and Rhythm: Normal rate and regular rhythm.  Pulmonary:     Effort: Pulmonary effort is normal.     Breath sounds: Normal breath sounds.  Abdominal:     Tenderness: There is no abdominal tenderness.  Musculoskeletal:     Right lower leg: No edema.     Left lower leg: No edema.     Comments: No leg edema.  Straight leg raise are negative.  Skin:    Findings: No rash.  Neurological:     Comments: 2+ reflexes knee and  ankle bilaterally.  Full strength lower extremities.  Gait normal.  Normal sensory function to touch throughout.        Assessment:     #1 right lumbar radiculitis symptoms with nonfocal neuro exam  #2 B12 deficiency    Plan:     -Continue Tylenol as needed for pain.  Consider supplement with tramadol if needed.  1 every 6 hours as needed #60 -Try to avoid steroids with her history of Eliquis and past history of upper GI bleed -Follow-up either here or with neurosurgeon for any progressive pain, numbness, weakness -B12 injection given  [name] MD [facility]

## 2019-02-23 ENCOUNTER — Ambulatory Visit: Payer: Medicare Other

## 2019-02-28 ENCOUNTER — Telehealth: Payer: Self-pay

## 2019-02-28 ENCOUNTER — Other Ambulatory Visit: Payer: Self-pay

## 2019-02-28 ENCOUNTER — Other Ambulatory Visit: Payer: Self-pay | Admitting: Family Medicine

## 2019-02-28 DIAGNOSIS — K317 Polyp of stomach and duodenum: Secondary | ICD-10-CM

## 2019-02-28 NOTE — Telephone Encounter (Signed)
-----   Message from Timothy Lasso, RN sent at 02/17/2019 10:10 AM EDT ----- Please call the patient.  The polyp in her proximal stomach is NOT cancer or pre-cancerous but certainly may be causing her anemia.  As we discussed at time of her EGD, I want to repeat EGD at [facility] for polyp resection.  NExt available [facility] MAC spot (45 min case ok).  Thanks

## 2019-02-28 NOTE — Telephone Encounter (Signed)
EGD [facility] 04/07/19 at 832 am Dr Ardis Hughs COVID testing 12/1

## 2019-02-28 NOTE — Telephone Encounter (Signed)
Error COVID testing on 12/7  Left message on machine to call back

## 2019-03-01 NOTE — Telephone Encounter (Signed)
EGD scheduled, pt instructed and medications reviewed.  Patient instructions mailed to home.  Patient to call with any questions or concerns. COVID testing also advised and understanding verbalized

## 2019-03-08 NOTE — Telephone Encounter (Signed)
The pt has been advised to hold Eliquis prior to her procedure. The pt has been advised of the information and verbalized understanding.

## 2019-03-29 ENCOUNTER — Other Ambulatory Visit (HOSPITAL_COMMUNITY): Payer: Medicare Other

## 2019-04-04 ENCOUNTER — Other Ambulatory Visit (HOSPITAL_COMMUNITY)
Admission: RE | Admit: 2019-04-04 | Discharge: 2019-04-04 | Disposition: A | Discharge status: Home / Self Care | Payer: Medicare Other | Source: Ambulatory Visit | Attending: Gastroenterology | Admitting: Gastroenterology

## 2019-04-04 DIAGNOSIS — Z20828 Contact with and (suspected) exposure to other viral communicable diseases: Secondary | ICD-10-CM | POA: Diagnosis not present

## 2019-04-04 DIAGNOSIS — Z01812 Encounter for preprocedural laboratory examination: Secondary | ICD-10-CM | POA: Insufficient documentation

## 2019-04-05 ENCOUNTER — Encounter (HOSPITAL_COMMUNITY): Payer: Self-pay | Admitting: *Deleted

## 2019-04-05 LAB — NOVEL CORONAVIRUS, NAA (HOSP ORDER, SEND-OUT TO REF LAB; TAT 18-24 HRS): SARS-CoV-2, NAA: NOT DETECTED

## 2019-04-07 ENCOUNTER — Ambulatory Visit (HOSPITAL_COMMUNITY): Payer: Medicare Other | Admitting: Physician Assistant

## 2019-04-07 ENCOUNTER — Ambulatory Visit (HOSPITAL_COMMUNITY)
Admission: RE | Admit: 2019-04-07 | Discharge: 2019-04-07 | Disposition: A | Discharge status: Home / Self Care | Payer: Medicare Other | Attending: Gastroenterology | Admitting: Gastroenterology

## 2019-04-07 ENCOUNTER — Encounter (HOSPITAL_COMMUNITY): Payer: Self-pay | Admitting: Gastroenterology

## 2019-04-07 ENCOUNTER — Encounter (HOSPITAL_COMMUNITY)
Admission: RE | Disposition: A | Discharge status: Home / Self Care | Payer: Self-pay | Source: Home / Self Care | Attending: Gastroenterology

## 2019-04-07 DIAGNOSIS — M797 Fibromyalgia: Secondary | ICD-10-CM | POA: Insufficient documentation

## 2019-04-07 DIAGNOSIS — M810 Age-related osteoporosis without current pathological fracture: Secondary | ICD-10-CM | POA: Insufficient documentation

## 2019-04-07 DIAGNOSIS — K317 Polyp of stomach and duodenum: Secondary | ICD-10-CM

## 2019-04-07 DIAGNOSIS — M1990 Unspecified osteoarthritis, unspecified site: Secondary | ICD-10-CM | POA: Diagnosis not present

## 2019-04-07 DIAGNOSIS — E039 Hypothyroidism, unspecified: Secondary | ICD-10-CM | POA: Insufficient documentation

## 2019-04-07 DIAGNOSIS — I2510 Atherosclerotic heart disease of native coronary artery without angina pectoris: Secondary | ICD-10-CM | POA: Insufficient documentation

## 2019-04-07 DIAGNOSIS — D509 Iron deficiency anemia, unspecified: Secondary | ICD-10-CM | POA: Diagnosis not present

## 2019-04-07 DIAGNOSIS — Z888 Allergy status to other drugs, medicaments and biological substances status: Secondary | ICD-10-CM | POA: Diagnosis not present

## 2019-04-07 DIAGNOSIS — Z882 Allergy status to sulfonamides status: Secondary | ICD-10-CM | POA: Insufficient documentation

## 2019-04-07 DIAGNOSIS — I739 Peripheral vascular disease, unspecified: Secondary | ICD-10-CM | POA: Insufficient documentation

## 2019-04-07 DIAGNOSIS — I10 Essential (primary) hypertension: Secondary | ICD-10-CM | POA: Insufficient documentation

## 2019-04-07 DIAGNOSIS — Z951 Presence of aortocoronary bypass graft: Secondary | ICD-10-CM | POA: Insufficient documentation

## 2019-04-07 DIAGNOSIS — K589 Irritable bowel syndrome without diarrhea: Secondary | ICD-10-CM | POA: Diagnosis not present

## 2019-04-07 DIAGNOSIS — E785 Hyperlipidemia, unspecified: Secondary | ICD-10-CM | POA: Insufficient documentation

## 2019-04-07 DIAGNOSIS — K228 Other specified diseases of esophagus: Secondary | ICD-10-CM | POA: Diagnosis not present

## 2019-04-07 DIAGNOSIS — Z885 Allergy status to narcotic agent status: Secondary | ICD-10-CM | POA: Insufficient documentation

## 2019-04-07 HISTORY — PX: SCLEROTHERAPY: SHX6841

## 2019-04-07 HISTORY — PX: ESOPHAGOGASTRODUODENOSCOPY (EGD) WITH PROPOFOL: SHX5813

## 2019-04-07 HISTORY — PX: HEMOSTASIS CLIP PLACEMENT: SHX6857

## 2019-04-07 HISTORY — PX: HOT HEMOSTASIS: SHX5433

## 2019-04-07 HISTORY — PX: POLYPECTOMY: SHX5525

## 2019-04-07 LAB — CBC
HCT: 36.3 % (ref 36.0–46.0)
Hemoglobin: 10.8 g/dL — ABNORMAL LOW (ref 12.0–15.0)
MCH: 21.7 pg — ABNORMAL LOW (ref 26.0–34.0)
MCHC: 29.8 g/dL — ABNORMAL LOW (ref 30.0–36.0)
MCV: 73 fL — ABNORMAL LOW (ref 80.0–100.0)
Platelets: 214 10*3/uL (ref 150–400)
RBC: 4.97 MIL/uL (ref 3.87–5.11)
RDW: 19.9 % — ABNORMAL HIGH (ref 11.5–15.5)
WBC: 5.9 10*3/uL (ref 4.0–10.5)
nRBC: 0 % (ref 0.0–0.2)

## 2019-04-07 LAB — GLUCOSE, CAPILLARY: Glucose-Capillary: 85 mg/dL (ref 70–99)

## 2019-04-07 SURGERY — ESOPHAGOGASTRODUODENOSCOPY (EGD) WITH PROPOFOL
Anesthesia: Monitor Anesthesia Care

## 2019-04-07 MED ORDER — SODIUM CHLORIDE 0.9 % IV SOLN: INTRAVENOUS | Status: DC

## 2019-04-07 MED ORDER — SODIUM CHLORIDE (PF) 0.9 % IJ SOLN
PREFILLED_SYRINGE | INTRAMUSCULAR | Status: DC | PRN
  Administered 2019-04-07: 4 mL

## 2019-04-07 MED ORDER — LIDOCAINE 2% (20 MG/ML) 5 ML SYRINGE
INTRAMUSCULAR | Status: DC | PRN
  Administered 2019-04-07: 40 mg via INTRAVENOUS

## 2019-04-07 MED ORDER — EPINEPHRINE 1 MG/10ML IJ SOSY
PREFILLED_SYRINGE | INTRAMUSCULAR | Status: AC
  Filled 2019-04-07: qty 10

## 2019-04-07 MED ORDER — PROPOFOL 10 MG/ML IV BOLUS
INTRAVENOUS | Status: AC
  Filled 2019-04-07: qty 40

## 2019-04-07 MED ORDER — PROPOFOL 10 MG/ML IV BOLUS
INTRAVENOUS | Status: AC
  Filled 2019-04-07: qty 20

## 2019-04-07 MED ORDER — LACTATED RINGERS IV SOLN
INTRAVENOUS | Status: DC
  Administered 2019-04-07: 1000 mL via INTRAVENOUS
  Administered 2019-04-07: 10:00:00 via INTRAVENOUS

## 2019-04-07 MED ORDER — PROPOFOL 10 MG/ML IV BOLUS
INTRAVENOUS | Status: DC | PRN
  Administered 2019-04-07 (×4): 20 mg via INTRAVENOUS
  Administered 2019-04-07: 40 mg via INTRAVENOUS
  Administered 2019-04-07 (×20): 20 mg via INTRAVENOUS

## 2019-04-07 SURGICAL SUPPLY — 15 items

## 2019-04-07 NOTE — Op Note (Signed)
[facility] Patient Name: [name] Procedure Date: 04/07/2019 MRN: [id number] Attending MD: Milus Banister , MD Date of Birth: 1932/12/07 CSN: [id number] Age: 85 Admit Type: Outpatient Procedure:                Upper GI endoscopy Indications:              Iron deficiency anemia, felt in part due to friable                            pedunculated polyp just distal to the GE junction,                            preven previously to be hyperplastic on pathology Providers:                Milus Banister, MD, Grace Isaac, RN, Benay Pillow, RN, Lazaro Arms, Technician Referring MD:              Medicines:                Monitored Anesthesia Care Complications:            No immediate complications. Estimated blood loss:                            None. Estimated Blood Loss:     Estimated blood loss: none. Procedure:                Pre-Anesthesia Assessment:                           - Prior to the procedure, a History and Physical                            was performed, and patient medications and                            allergies were reviewed. The patient's tolerance of                            previous anesthesia was also reviewed. The risks                            and benefits of the procedure and the sedation                            options and risks were discussed with the patient.                            All questions were answered, and informed consent                            was obtained. Prior Anticoagulants: The patient has  taken Eliquis (apixaban), last dose was 3 days                            prior to procedure. ASA Grade Assessment: III - A                            patient with severe systemic disease. After                            reviewing the risks and benefits, the patient was                            deemed in satisfactory condition to undergo the            procedure.                           After obtaining informed consent, the endoscope was                            passed under direct vision. Throughout the                            procedure, the patient's blood pressure, pulse, and                            oxygen saturations were monitored continuously. The                            [id number]-H190 [id number] Olympus gastroscope was                            introduced through the mouth, and advanced to the                            antrum of the stomach. The [id number]-H190DL [id number]                            Olympus pediatric colonscope was introduced through                            the and advanced to the. The [id number]-[id number] [id number]                            Olympus duodenoscope was introduced through the and                            advanced to the. The upper GI endoscopy was                            accomplished without difficulty. The patient                            tolerated the procedure well. Scope In: Scope Out: Findings:  The previously noted friable, 2-3cm semipedunculated polyp just distal       to the GE junction was again located. Minor scope trauma caused it to       bleed. I removed the polyp in piecemeal fashion with snare/cautery.       There was persistent slow bleeding from the polypectomy site. I treated       the bleeding with epinephrine injection and placement of 4 endoscopic       clips (resolution 360s). Scope position and clip angle of attack was       less than ideal in retroflex position. I briefly passed a side viewing       duodenoscope to see if clip positioning would be easier. This resulted       in a linear, very superficial, mucosal tear in the body of the stomach       (see images). Eventually one clip misfired but four clips were placed at       the postpolypectomy bleeding site in good position using a pediatric       colonoscopy, forward viewing. Impression:               -  Proximal gastric polyp, removed and retrieved.                           - Post polypectomy bleeding was treated with                            epinephrine injection and placement of four                            endoclips. Moderate Sedation:      Not Applicable - Patient had care per Anesthesia. Recommendation:           - Patient has a contact number available for                            emergencies. The signs and symptoms of potential                            delayed complications were discussed with the                            patient. Return to normal activities tomorrow.                            Written discharge instructions were provided to the                            patient.                           - Resume previous diet.                           - Continue present medications. You should take one                            OTC iron supplement once daily.  Ferrous gluconate                            325mg .                           - Await pathology results.                           - OK to resume your eliquis tomorrow. Procedure Code(s):        --- Professional ---                           [phone], Esophagogastroduodenoscopy, flexible,                            transoral; with removal of tumor(s), polyp(s), or                            other lesion(s) by snare technique Diagnosis Code(s):        --- Professional ---                           K31.7, Polyp of stomach and duodenum                           D50.9, Iron deficiency anemia, unspecified CPT copyright 2019 American Medical Association. All rights reserved. The codes documented in this report are preliminary and upon coder review may  be revised to meet current compliance requirements. Milus Banister, MD 04/07/2019 10:36:48 AM This report has been signed electronically. Number of Addenda: 0

## 2019-04-07 NOTE — Anesthesia Preprocedure Evaluation (Signed)
Anesthesia Evaluation  Patient identified by MRN, date of birth, ID band Patient awake    Reviewed: Allergy & Precautions, NPO status , Patient's Chart, lab work & pertinent test results  Airway Mallampati: II  TM Distance: >3 FB Neck ROM: Full    Dental  (+) Teeth Intact, Dental Advisory Given   Pulmonary    breath sounds clear to auscultation       Cardiovascular hypertension,  Rhythm:Regular Rate:Normal     Neuro/Psych    GI/Hepatic   Endo/Other    Renal/GU      Musculoskeletal   Abdominal   Peds  Hematology   Anesthesia Other Findings   Reproductive/Obstetrics                             Anesthesia Physical Anesthesia Plan  ASA: III  Anesthesia Plan: MAC   Post-op Pain Management:    Induction: Intravenous  PONV Risk Score and Plan: Ondansetron and Propofol infusion  Airway Management Planned: Natural Airway and Nasal Cannula  Additional Equipment:   Intra-op Plan:   Post-operative Plan:   Informed Consent: I have reviewed the patients History and Physical, chart, labs and discussed the procedure including the risks, benefits and alternatives for the proposed anesthesia with the patient or authorized representative who has indicated his/her understanding and acceptance.     Dental advisory given  Plan Discussed with: CRNA and Anesthesiologist  Anesthesia Plan Comments:         Anesthesia Quick Evaluation

## 2019-04-07 NOTE — H&P (Signed)
HPI: This is a very pleasant 85 yo woman  I found a proximal gastric polyp 2 months ago by EGD, felt possibly to be causing her IDA.  Biopsies showed no sign of neoplasia.    Chief complaint is proximal gastric polyp  ROS: complete GI ROS as described in HPI, all other review negative.  Constitutional:  No unintentional weight loss   Past Medical History:  Diagnosis Date  . Allergy   . Anemia, pernicious   . Arthritis    bursitis   . Atrophic gastritis   . B12 deficiency   . CAD (coronary artery disease)   . Carotid artery stenosis   . Cataract   . Diarrhea    on and off for months  . Family history of adverse reaction to anesthesia    extreme nausea brother and sister  . Fibromyalgia   . Gallbladder polyp   . GERD (gastroesophageal reflux disease)   . Heart murmur   . History of hiatal hernia   . History of kidney stones   . HLD (hyperlipidemia)   . HTN (hypertension)   . Hypothyroidism   . Irritable bowel syndrome (IBS)   . Median arcuate ligament syndrome ([place])   . Osteoporosis   . PVD (peripheral vascular disease) ([place])   . Tubular adenoma of colon 08/2013   with focal high grade dysplasia    Past Surgical History:  Procedure Laterality Date  . ABDOMINAL HYSTERECTOMY    . APPENDECTOMY    . BIOPSY THYROID    . cataract surgery    . CHOLECYSTECTOMY N/A 10/24/2014   Procedure: MICROSCOPIC  CHOLECYSTECTOMY;  Surgeon: Sally Ok, MD;  Location: [place];  Service: General;  Laterality: N/A;  . COLONOSCOPY    . CORONARY ARTERY BYPASS GRAFT  07  . ESOPHAGEAL MANOMETRY N/A 08/02/2012   Procedure: ESOPHAGEAL MANOMETRY (EM);  Surgeon: Sally Feil, MD;  [facility];  Service: Endoscopy;  Laterality: N/A;  . THYROID LOBECTOMY    . UPPER GASTROINTESTINAL ENDOSCOPY      Current Facility-Administered Medications  Medication Dose Route Frequency Provider Last Rate Last Admin  . 0.9 %  sodium chloride infusion   Intravenous Continuous Sally Banister, MD      . lactated ringers infusion   Intravenous Continuous Sally Banister, MD        Allergies as of 02/28/2019 - Review Complete 02/22/2019  Allergen Reaction Noted  . Actos [pioglitazone] Swelling 10/06/2013  . Sulfonamide derivatives Swelling 01/08/2007  . Codeine Nausea And Vomiting 01/08/2007    Family History  Problem Relation Age of Onset  . Heart disease Mother   . Cholelithiasis Mother   . Prostate cancer Brother   . Colon cancer Brother   . Breast cancer Other        niece - breast cancer  . Diabetes Sister   . Lung cancer Father   . Lung cancer Sister   . Stomach cancer Cousin   . Esophageal cancer Neg Hx   . Rectal cancer Neg Hx     Social History   Socioeconomic History  . Marital status: Married    Spouse name: Not on file  . Number of children: Not on file  . Years of education: 52  . Highest education level: Not on file  Occupational History  . Occupation: retired    Fish farm manager: RETIRED  Tobacco Use  . Smoking status: Never Smoker  . Smokeless tobacco: Never Used  Substance and Sexual Activity  .  Alcohol use: No    Alcohol/week: 0.0 standard drinks  . Drug use: No  . Sexual activity: Not on file  Other Topics Concern  . Not on file  Social History Narrative   [name], Married. Lives with husband. Had been a very active woman.   Social Determinants of Health   Financial Resource Strain:   . Difficulty of Paying Living Expenses: Not on file  Food Insecurity:   . Worried About Charity fundraiser in the Last Year: Not on file  . Ran Out of Food in the Last Year: Not on file  Transportation Needs:   . Lack of Transportation (Medical): Not on file  . Lack of Transportation (Non-Medical): Not on file  Physical Activity:   . Days of Exercise per Week: Not on file  . Minutes of Exercise per Session: Not on file  Stress:   . Feeling of Stress : Not on file  Social Connections:   . Frequency of Communication with Friends and Family: Not on  file  . Frequency of Social Gatherings with Friends and Family: Not on file  . Attends Religious Services: Not on file  . Active Member of Clubs or Organizations: Not on file  . Attends Archivist Meetings: Not on file  . Marital Status: Not on file  Intimate Partner Violence:   . Fear of Current or Ex-Partner: Not on file  . Emotionally Abused: Not on file  . Physically Abused: Not on file  . Sexually Abused: Not on file     Physical Exam: There were no vitals taken for this visit. Constitutional: generally well-appearing Psychiatric: alert and oriented x3 Abdomen: soft, nontender, nondistended, no obvious ascites, no peritoneal signs, normal bowel sounds No peripheral edema noted in lower extremities  Assessment and plan: 85 y.o. female with proximal gastric polyp  For EGD with polyp resection today.  Please see the "Patient Instructions" section for addition details about the plan.  Sally Loffler, MD [facility] 04/07/2019, 7:22 AM

## 2019-04-07 NOTE — Transfer of Care (Signed)
Immediate Anesthesia Transfer of Care Note  Patient: [name]  Procedure(s) Performed: ESOPHAGOGASTRODUODENOSCOPY (EGD) WITH PROPOFOL (N/A ) HOT HEMOSTASIS (ARGON PLASMA COAGULATION/BICAP) (N/A ) POLYPECTOMY SCLEROTHERAPY HEMOSTASIS CLIP PLACEMENT  Patient Location: PACU and Endoscopy Unit  Anesthesia Type:MAC  Level of Consciousness: awake and alert   Airway & Oxygen Therapy: Patient Spontanous Breathing and Patient connected to nasal cannula oxygen  Post-op Assessment: Report given to RN and Post -op Vital signs reviewed and stable  Post vital signs: Reviewed and stable  Last Vitals:  Vitals Value Taken Time  BP 158/47 04/07/19 1044  Temp 36.5 C 04/07/19 1023  Pulse 67 04/07/19 1048  Resp 16 04/07/19 1048  SpO2 94 % 04/07/19 1048  Vitals shown include unvalidated device data.  Last Pain:  Vitals:   04/07/19 1040  TempSrc:   PainSc: 0-No pain         Complications: No apparent anesthesia complications

## 2019-04-07 NOTE — Anesthesia Postprocedure Evaluation (Signed)
Anesthesia Post Note  Patient: [name]  Procedure(s) Performed: ESOPHAGOGASTRODUODENOSCOPY (EGD) WITH PROPOFOL (N/A ) HOT HEMOSTASIS (ARGON PLASMA COAGULATION/BICAP) (N/A ) POLYPECTOMY [place]     Patient location during evaluation: Endoscopy Anesthesia Type: MAC Level of consciousness: awake and alert Pain management: pain level controlled Vital Signs Assessment: post-procedure vital signs reviewed and stable Respiratory status: spontaneous breathing, nonlabored ventilation, respiratory function stable and patient connected to nasal cannula oxygen Cardiovascular status: stable and blood pressure returned to baseline Postop Assessment: no apparent nausea or vomiting Anesthetic complications: no    Last Vitals:  Vitals:   04/07/19 1040 04/07/19 1050  BP: (!) 161/58 (!) 166/44  Pulse: 70 72  Resp: 15 (!) 21  Temp:    SpO2: 93% 92%    Last Pain:  Vitals:   04/07/19 1050  TempSrc:   PainSc: 0-No pain                 Kapri Nero COKER

## 2019-04-07 NOTE — Discharge Instructions (Signed)
Start taking Iron pills every day  YOU HAD AN ENDOSCOPIC PROCEDURE TODAY: Refer to the procedure report and other information in the discharge instructions given to you for any specific questions about what was found during the examination. If this information does not answer your questions, please call [place] office at [phone] to clarify.   YOU SHOULD EXPECT: Some feelings of bloating in the abdomen. Passage of more gas than usual. Walking can help get rid of the air that was put into your GI tract during the procedure and reduce the bloating. If you had a lower endoscopy (such as a colonoscopy or flexible sigmoidoscopy) you may notice spotting of blood in your stool or on the toilet paper. Some abdominal soreness may be present for a day or two, also.  DIET: Your first meal following the procedure should be a light meal and then it is ok to progress to your normal diet. A half-sandwich or bowl of soup is an example of a good first meal. Heavy or fried foods are harder to digest and may make you feel nauseous or bloated. Drink plenty of fluids but you should avoid alcoholic beverages for 24 hours. If you had a esophageal dilation, please see attached instructions for diet.    ACTIVITY: Your care partner should take you home directly after the procedure. You should plan to take it easy, moving slowly for the rest of the day. You can resume normal activity the day after the procedure however YOU SHOULD NOT DRIVE, use power tools, machinery or perform tasks that involve climbing or major physical exertion for 24 hours (because of the sedation medicines used during the test).   SYMPTOMS TO REPORT IMMEDIATELY: A gastroenterologist can be reached at any hour. Please call [phone]  for any of the following symptoms:  Following lower endoscopy (colonoscopy, flexible sigmoidoscopy) Excessive amounts of blood in the stool  Significant tenderness, worsening of abdominal pains  Swelling of the abdomen  that is new, acute  Fever of 100 or higher  Following upper endoscopy (EGD, EUS, ERCP, esophageal dilation) Vomiting of blood or coffee ground material  New, significant abdominal pain  New, significant chest pain or pain under the shoulder blades  Painful or persistently difficult swallowing  New shortness of breath  Black, tarry-looking or red, bloody stools  FOLLOW UP:  If any biopsies were taken you will be contacted by phone or by letter within the next 1-3 weeks. Call [phone]  if you have not heard about the biopsies in 3 weeks.  Please also call with any specific questions about appointments or follow up tests.

## 2019-04-07 NOTE — Anesthesia Procedure Notes (Signed)
Procedure Name: MAC Date/Time: 04/07/2019 9:22 AM Performed by: Cynda Familia, CRNA Pre-anesthesia Checklist: Patient identified, Emergency Drugs available, Suction available, Patient being monitored and Timeout performed Patient Re-evaluated:Patient Re-evaluated prior to induction Oxygen Delivery Method: Nasal cannula Placement Confirmation: positive ETCO2 and breath sounds checked- equal and bilateral Dental Injury: Teeth and Oropharynx as per pre-operative assessment  Comments: Bite block by RN

## 2019-04-08 ENCOUNTER — Encounter: Payer: Self-pay | Admitting: *Deleted

## 2019-04-08 LAB — SURGICAL PATHOLOGY

## 2019-04-11 ENCOUNTER — Other Ambulatory Visit: Payer: Self-pay

## 2019-04-11 DIAGNOSIS — K317 Polyp of stomach and duodenum: Secondary | ICD-10-CM

## 2019-04-11 DIAGNOSIS — D649 Anemia, unspecified: Secondary | ICD-10-CM

## 2019-04-12 ENCOUNTER — Ambulatory Visit: Payer: Self-pay | Admitting: *Deleted

## 2019-04-12 ENCOUNTER — Other Ambulatory Visit: Payer: Self-pay

## 2019-04-12 ENCOUNTER — Ambulatory Visit (INDEPENDENT_AMBULATORY_CARE_PROVIDER_SITE_OTHER): Payer: Medicare Other | Admitting: Family Medicine

## 2019-04-12 ENCOUNTER — Encounter: Payer: Self-pay | Admitting: Family Medicine

## 2019-04-12 VITALS — BP 124/60 | HR 100 | Temp 97.6°F | Ht 63.0 in | Wt 100.4 lb

## 2019-04-12 DIAGNOSIS — I4819 Other persistent atrial fibrillation: Secondary | ICD-10-CM

## 2019-04-12 DIAGNOSIS — I10 Essential (primary) hypertension: Secondary | ICD-10-CM

## 2019-04-12 DIAGNOSIS — D509 Iron deficiency anemia, unspecified: Secondary | ICD-10-CM | POA: Diagnosis not present

## 2019-04-12 DIAGNOSIS — R55 Syncope and collapse: Secondary | ICD-10-CM | POA: Diagnosis not present

## 2019-04-12 LAB — POCT HEMOGLOBIN: Hemoglobin: 10 g/dL — AB (ref 11–14.6)

## 2019-04-12 NOTE — Patient Instructions (Signed)
HOLD the Amlodipine for now  Stay well hydrated  Get up and change positions slowly.    Let's plan on one week follow up.

## 2019-04-12 NOTE — Telephone Encounter (Signed)
Message Routed to PCP CMA 

## 2019-04-12 NOTE — Progress Notes (Signed)
Subjective:     Patient ID: [name], female   DOB: 1932/05/17, 85 y.o.   MRN: [id number]  HPI   [name] has chronic problems including persistent atrial fibrillation, hypertension, peripheral vascular disease, history of gastric polyp, hypothyroidism, osteoporosis.  She had recent EGD last Thursday for gastric polyp removal.  She has had some occasional "dark "stools since then but not clearly with his melena.  No hematemesis.  Her hemoglobin back in August of 9.4 and was up to 10.8 on 04/07/2019.  She is taking iron once daily.  She states that Saturday night she went to bed and felt fine but around 3 AM Sunday morning went up to go the bathroom and was found on the floor by her husband.  Unknown loss of consciousness duration.  Apparently few seconds.  No reported injuries.  She went back to bed and stayed in bed much of the day.  Sunday night she got up and went to the kitchen to pair some dinner and had another brief syncopal episode.  No seizure activity.  She has felt slightly better since then with no recurrent syncope since Sunday night.  She was taken recently off hydralazine by cardiology back in October.  She currently remains on Coreg, Aldactone, losartan, and amlodipine.  She is back on her Eliquis.  No aspirin.  Denies any recent chest pains.  No focal weakness.  Poor appetite intermittently which is been going on for months.  Her weight is stable over the past few months  Wt Readings from Last 3 Encounters:  04/12/19 100 lb 6.4 oz (45.5 kg)  02/22/19 101 lb 8 oz (46 kg)  02/14/19 101 lb 6.4 oz (46 kg)     Past Medical History:  Diagnosis Date  . Allergy   . Anemia, pernicious   . Arthritis    bursitis   . Atrophic gastritis   . B12 deficiency   . CAD (coronary artery disease)   . Carotid artery stenosis   . Cataract   . Diarrhea    on and off for months  . Family history of adverse reaction to anesthesia    extreme nausea brother and sister  .  Fibromyalgia   . Gallbladder polyp   . GERD (gastroesophageal reflux disease)   . Heart murmur   . History of hiatal hernia   . History of kidney stones   . HLD (hyperlipidemia)   . HTN (hypertension)   . Hypothyroidism   . Irritable bowel syndrome (IBS)   . Median arcuate ligament syndrome ([place])   . Osteoporosis   . PVD (peripheral vascular disease) ([place])   . Tubular adenoma of colon 08/2013   with focal high grade dysplasia   Past Surgical History:  Procedure Laterality Date  . ABDOMINAL HYSTERECTOMY    . APPENDECTOMY    . BIOPSY THYROID    . cataract surgery    . CHOLECYSTECTOMY N/A 10/24/2014   Procedure: MICROSCOPIC  CHOLECYSTECTOMY;  Surgeon: Ralene Ok, MD;  Location: [place];  Service: General;  Laterality: N/A;  . COLONOSCOPY    . CORONARY ARTERY BYPASS GRAFT  07  . ESOPHAGEAL MANOMETRY N/A 08/02/2012   Procedure: ESOPHAGEAL MANOMETRY (EM);  Surgeon: Sable Feil, MD;  [facility];  Service: Endoscopy;  Laterality: N/A;  . ESOPHAGOGASTRODUODENOSCOPY (EGD) WITH PROPOFOL N/A 04/07/2019   Procedure: ESOPHAGOGASTRODUODENOSCOPY (EGD) WITH PROPOFOL;  Surgeon: Milus Banister, MD;  [facility];  Service: Endoscopy;  Laterality: N/A;  . HEMOSTASIS CLIP PLACEMENT  04/07/2019   Procedure: HEMOSTASIS CLIP PLACEMENT;  Surgeon: Milus Banister, MD;  [facility];  Service: Endoscopy;;  . HOT HEMOSTASIS N/A 04/07/2019   Procedure: HOT HEMOSTASIS (ARGON PLASMA COAGULATION/BICAP);  Surgeon: Milus Banister, MD;  Location[facility];  Service: Endoscopy;  Laterality: N/A;  . POLYPECTOMY  04/07/2019   Procedure: POLYPECTOMY;  Surgeon: Milus Banister, MD;  [facility];  Service: Endoscopy;;  . [name]  04/07/2019   Procedure: [name];  Surgeon: Milus Banister, MD;  [facility];  Service: Endoscopy;;  . THYROID LOBECTOMY    . UPPER GASTROINTESTINAL ENDOSCOPY      reports that she has never smoked. She has  never used smokeless tobacco. She reports that she does not drink alcohol or use drugs. family history includes Breast cancer in an other family member; Cholelithiasis in her mother; Colon cancer in her brother; Diabetes in her sister; Heart disease in her mother; Lung cancer in her father and sister; Prostate cancer in her brother; Stomach cancer in her cousin. Allergies  Allergen Reactions  . Actos [Pioglitazone] Swelling    Swelling  . Sulfonamide Derivatives Swelling  . Adhesive [Tape] Other (See Comments)    WITH PROLONGED EXPOSURE (REDNESS/ITCHING)  . Codeine Nausea And Vomiting     Review of Systems  Constitutional: Positive for fatigue. Negative for chills and fever.  Eyes: Negative for visual disturbance.  Respiratory: Negative for cough, chest tightness, shortness of breath and wheezing.   Cardiovascular: Negative for chest pain, palpitations and leg swelling.  Gastrointestinal: Negative for abdominal pain, nausea and vomiting.  Genitourinary: Negative for dysuria.  Musculoskeletal: Negative for back pain.  Neurological: Positive for dizziness, syncope and light-headedness. Negative for seizures, weakness and headaches.       Objective:   Physical Exam Vitals reviewed.  Constitutional:      General: She is not in acute distress.    Appearance: Normal appearance.  Cardiovascular:     Rate and Rhythm: Normal rate.  Pulmonary:     Effort: Pulmonary effort is normal.     Breath sounds: Normal breath sounds.  Musculoskeletal:     Right lower leg: No edema.     Left lower leg: No edema.  Neurological:     General: No focal deficit present.     Mental Status: She is alert and oriented to person, place, and time.     Cranial Nerves: No cranial nerve deficit.     Comments: No focal strength deficits        Assessment:     #1 recent syncopal episodes x2 in a patient with anemia and recent gastric polyp removal as above and on Eliquis for chronic atrial fibrillation..   She has had some recent low blood pressure readings but does not demonstrate any orthostatic change in office today.  EKG today in office shows atrial fibrillation with ventricular rate around 95  #2 iron deficiency anemia.  Repeat hemoglobin by fingerstick today in office 10.0 which is very little change from recent CBC of 10.8  #3 recent gastric polyp removal by EGD on Thursday  #4 chronic atrial fibrillation on Eliquis    Plan:     -Stay well-hydrated -Continue iron supplement -Hold amlodipine for now and monitor blood pressure closely.  Be in touch if systolic readings start climbing back over 140 -We will plan follow-up in 1 week and consider repeat CBC by then -We also reminded her to get up and count several seconds before ambulating -Follow-up immediately  for any recurrent syncope  [name] MD [facility]

## 2019-04-12 NOTE — Telephone Encounter (Signed)
Patient has an appointment with [place] today at 2:45pm.

## 2019-04-12 NOTE — Telephone Encounter (Signed)
  Pt called in concerned that she has passed out 2 times.   She had a bleeding polyp removed last Thursday.    She has been anemic.   She was just started on iron.  It hits her all of a sudden.  See triage notes.  Protocol is for her to be seen within 4 hours however she really wants to see Dr. Elease Hashimoto and the only openings he has are this afternoon virtually  (which she was agreeable to do) so I went over the s/s indicating that she should go to the ED prior to this appt.   Chest pain, shortness of breath, breaking out into a sweat, dizziness, passes out again or rectal bleeding.   She verbalized understanding and was agreeable to this plan.  I warm transferred her call into Dr. Erick Blinks office to [facility] to be scheduled.  I sent my notes to the office.  Reason for Disposition . [1] All other patients AND [2] now alert and feels fine(Exception: SIMPLE FAINT due to stress, pain, prolonged standing, or suddenly standing)  Answer Assessment - Initial Assessment Questions 1. ONSET: "How long were you unconscious?" (minutes) "When did it happen?"     I passed out Saturday night during the night going to the bathroom.   I also passed out Sunday  evening.   I had a polyp removed Thursday.   I was fine Fri and Sat.   I slept most of Sunday. 2. CONTENT: "What happened during period of unconsciousness?" (e.g., seizure activity)      I woke up with my husband helping me after I fainted.    I was started on iron due to anemia from the bleeding.   It was a bleeding polyp.   3. MENTAL STATUS: "Alert and oriented now?" (oriented x 3 = name, month, location)      Yes but feeling weak.   4. TRIGGER: "What do you think caused the fainting?" "What were you doing just before you fainted?"  (e.g., exercise, sudden standing up, prolonged standing)     I've been bleeding and have anemia prior to the polyp.    5. RECURRENT SYMPTOM: "Have you ever passed out before?" If so, ask: "When was the last time?" and "What  happened that time?"      Never 6. INJURY: "Did you sustain any injury during the fall?"      I don't think so..   My left hip is very tender and feels swollen.   7. CARDIAC SYMPTOMS: "Have you had any of the following symptoms: chest pain, difficulty breathing, palpitations?"     Valve leakage.   I've had heart surgery.  I saw cardiologist recently and everything was fine. 8. NEUROLOGIC SYMPTOMS: "Have you had any of the following symptoms: headache, numbness, vertigo, weakness?"     When you pass out it hits all of a sudden. 9. GI SYMPTOMS: "Have you had any of the following symptoms: abdominal pain, vomiting, diarrhea, blood in stools?"     Denies having black stools or sticky or pasty stools.   No bleeding from rectum. 10. OTHER SYMPTOMS: "Do you have any other symptoms?"       I don't have an appetite.    Maybe I'm not getting enough nutrition.   My weight down. 11. PREGNANCY: "Is there any chance you are pregnant?" "When was your last menstrual period?"       N/A due to age.  Protocols used: [facility]

## 2019-04-18 ENCOUNTER — Other Ambulatory Visit: Payer: Self-pay

## 2019-04-19 ENCOUNTER — Encounter: Payer: Self-pay | Admitting: Family Medicine

## 2019-04-19 ENCOUNTER — Ambulatory Visit (INDEPENDENT_AMBULATORY_CARE_PROVIDER_SITE_OTHER): Payer: Medicare Other | Admitting: Family Medicine

## 2019-04-19 VITALS — BP 142/60 | HR 62 | Temp 97.6°F | Ht 63.0 in | Wt 100.3 lb

## 2019-04-19 DIAGNOSIS — I10 Essential (primary) hypertension: Secondary | ICD-10-CM

## 2019-04-19 DIAGNOSIS — E538 Deficiency of other specified B group vitamins: Secondary | ICD-10-CM | POA: Diagnosis not present

## 2019-04-19 DIAGNOSIS — D509 Iron deficiency anemia, unspecified: Secondary | ICD-10-CM

## 2019-04-19 DIAGNOSIS — R42 Dizziness and giddiness: Secondary | ICD-10-CM

## 2019-04-19 MED ORDER — CYANOCOBALAMIN 1000 MCG/ML IJ SOLN
1000.0000 ug | Freq: Once | INTRAMUSCULAR | Status: AC
  Administered 2019-04-19: 1000 ug via INTRAMUSCULAR

## 2019-04-19 NOTE — Addendum Note (Signed)
Addended by: Anibal Henderson on: 04/19/2019 03:17 PM   Modules accepted: Orders

## 2019-04-19 NOTE — Patient Instructions (Signed)
Monitor blood pressure and if top number consistently > 150 we will need to start back the Amlodipine.

## 2019-04-19 NOTE — Progress Notes (Signed)
Subjective:     Patient ID: [name], female   DOB: 06-17-1932, 85 y.o.   MRN: [id number]  HPI  [name] seen for follow-up from recent visit.  She had weakness and some dizziness.  She had a couple of brief syncopal episodes.  Refer to recent note from 04/12/2019 for details   [name] has chronic problems including persistent atrial fibrillation, hypertension, peripheral vascular disease, history of gastric polyp, hypothyroidism, osteoporosis.  She had recent EGD last Thursday for gastric polyp removal.  She has had some occasional "dark "stools since then but not clearly with his melena.  No hematemesis.  Her hemoglobin back in August of 9.4 and was up to 10.8 on 04/07/2019.  She is taking iron once daily.  She states that Saturday night she went to bed and felt fine but around 3 AM Sunday morning went up to go the bathroom and was found on the floor by her husband.  Unknown loss of consciousness duration.  Apparently few seconds.  No reported injuries.  She went back to bed and stayed in bed much of the day.  Sunday night she got up and went to the kitchen to pair some dinner and had another brief syncopal episode.  No seizure activity.  She has felt slightly better since then with no recurrent syncope since Sunday night.  She was taken recently off hydralazine by cardiology back in October.  She currently remains on Coreg, Aldactone, losartan, and amlodipine.  She is back on her Eliquis.  No aspirin.  Denies any recent chest pains.  No focal weakness.  Poor appetite intermittently which is been going on for months.  Her weight is stable over the past few months  Her fingerstick hemoglobin was 10 which was stable.  We instructed her to stay well-hydrated, continue iron, and hold amlodipine.  She is having less dizziness and has had no further syncopal episodes since last visit.  No recent chest pains.  She has B12 deficiency and requesting injection today.  Past Medical  History:  Diagnosis Date  . Allergy   . Anemia, pernicious   . Arthritis    bursitis   . Atrophic gastritis   . B12 deficiency   . CAD (coronary artery disease)   . Carotid artery stenosis   . Cataract   . Diarrhea    on and off for months  . Family history of adverse reaction to anesthesia    extreme nausea brother and sister  . Fibromyalgia   . Gallbladder polyp   . GERD (gastroesophageal reflux disease)   . Heart murmur   . History of hiatal hernia   . History of kidney stones   . HLD (hyperlipidemia)   . HTN (hypertension)   . Hypothyroidism   . Irritable bowel syndrome (IBS)   . Median arcuate ligament syndrome ([place])   . Osteoporosis   . PVD (peripheral vascular disease) ([place])   . Tubular adenoma of colon 08/2013   with focal high grade dysplasia   Past Surgical History:  Procedure Laterality Date  . ABDOMINAL HYSTERECTOMY    . APPENDECTOMY    . BIOPSY THYROID    . cataract surgery    . CHOLECYSTECTOMY N/A 10/24/2014   Procedure: MICROSCOPIC  CHOLECYSTECTOMY;  Surgeon: Ralene Ok, MD;  Location: [place];  Service: General;  Laterality: N/A;  . COLONOSCOPY    . CORONARY ARTERY BYPASS GRAFT  07  . ESOPHAGEAL MANOMETRY N/A 08/02/2012   Procedure: ESOPHAGEAL MANOMETRY (EM);  Surgeon: Sable Feil, MD;  Location: [facility];  Service: Endoscopy;  Laterality: N/A;  . ESOPHAGOGASTRODUODENOSCOPY (EGD) WITH PROPOFOL N/A 04/07/2019   Procedure: ESOPHAGOGASTRODUODENOSCOPY (EGD) WITH PROPOFOL;  Surgeon: Milus Banister, MD;  [facility];  Service: Endoscopy;  Laterality: N/A;  . HEMOSTASIS CLIP PLACEMENT  04/07/2019   Procedure: HEMOSTASIS CLIP PLACEMENT;  Surgeon: Milus Banister, MD;  [facility];  Service: Endoscopy;;  . HOT HEMOSTASIS N/A 04/07/2019   Procedure: HOT HEMOSTASIS (ARGON PLASMA COAGULATION/BICAP);  Surgeon: Milus Banister, MD;  Location: [facility];  Service: Endoscopy;  Laterality: N/A;  . POLYPECTOMY  04/07/2019    Procedure: POLYPECTOMY;  Surgeon: Milus Banister, MD;  [facility];  Service: Endoscopy;;  . [name]  04/07/2019   Procedure: [name];  Surgeon: Milus Banister, MD;  [facility];  Service: Endoscopy;;  . THYROID LOBECTOMY    . UPPER GASTROINTESTINAL ENDOSCOPY      reports that she has never smoked. She has never used smokeless tobacco. She reports that she does not drink alcohol or use drugs. family history includes Breast cancer in an other family member; Cholelithiasis in her mother; Colon cancer in her brother; Diabetes in her sister; Heart disease in her mother; Lung cancer in her father and sister; Prostate cancer in her brother; Stomach cancer in her cousin. Allergies  Allergen Reactions  . Actos [Pioglitazone] Swelling    Swelling  . Sulfonamide Derivatives Swelling  . Adhesive [Tape] Other (See Comments)    WITH PROLONGED EXPOSURE (REDNESS/ITCHING)  . Codeine Nausea And Vomiting     Review of Systems  Constitutional: Negative for chills and fever.  Respiratory: Negative for cough and shortness of breath.   Cardiovascular: Negative for chest pain.  Gastrointestinal: Negative for abdominal pain and blood in stool.  Genitourinary: Negative for dysuria.  Neurological: Negative for dizziness, syncope, weakness and light-headedness.       Objective:   Physical Exam Vitals reviewed.  Constitutional:      Appearance: Normal appearance.  Cardiovascular:     Rate and Rhythm: Normal rate and regular rhythm.  Pulmonary:     Effort: Pulmonary effort is normal.     Breath sounds: Normal breath sounds.  Musculoskeletal:     Right lower leg: No edema.     Left lower leg: No edema.  Neurological:     Mental Status: She is alert.        Assessment:     #1 recent dizziness and syncope.  Suspect orthostatic related.  Symptoms did promptly improve after holding amlodipine therapy.  Blood pressure today seated by my reading 144/60 and standing  142/60.  No further syncopal episodes.  #2 history of B12 deficiency  #3 anemia.  Recent gastric polyp biopsy.  Recent fingerstick hemoglobin stable    Plan:     -Continue to hold amlodipine for now but suspect will need to reinitiate this after she starts to correct her anemia and build up her activities -Monitor blood pressure closely at home and if she is seeing consistent systolic readings are [id number] will start back amlodipine at lower dosage than her previous 10 -Continue iron replacement -Recommend 1 month follow-up.  Recheck CBC then and reassess blood pressure -B12 injection given  [name] MD [facility] at [facility]

## 2019-04-28 ENCOUNTER — Other Ambulatory Visit: Payer: Self-pay | Admitting: Family Medicine

## 2019-05-20 ENCOUNTER — Other Ambulatory Visit: Payer: Self-pay

## 2019-05-23 ENCOUNTER — Ambulatory Visit (INDEPENDENT_AMBULATORY_CARE_PROVIDER_SITE_OTHER): Payer: Medicare Other | Admitting: Family Medicine

## 2019-05-23 ENCOUNTER — Other Ambulatory Visit: Payer: Self-pay

## 2019-05-23 ENCOUNTER — Encounter: Payer: Self-pay | Admitting: Family Medicine

## 2019-05-23 ENCOUNTER — Other Ambulatory Visit (INDEPENDENT_AMBULATORY_CARE_PROVIDER_SITE_OTHER): Payer: Medicare Other

## 2019-05-23 VITALS — BP 122/74 | HR 90 | Temp 98.4°F | Ht 63.0 in | Wt 99.0 lb

## 2019-05-23 DIAGNOSIS — E538 Deficiency of other specified B group vitamins: Secondary | ICD-10-CM

## 2019-05-23 DIAGNOSIS — R1084 Generalized abdominal pain: Secondary | ICD-10-CM | POA: Diagnosis not present

## 2019-05-23 DIAGNOSIS — Z862 Personal history of diseases of the blood and blood-forming organs and certain disorders involving the immune mechanism: Secondary | ICD-10-CM

## 2019-05-23 DIAGNOSIS — D509 Iron deficiency anemia, unspecified: Secondary | ICD-10-CM

## 2019-05-23 LAB — CBC WITH DIFFERENTIAL/PLATELET
Basophils Absolute: 0.1 10*3/uL (ref 0.0–0.1)
Basophils Relative: 1.3 % (ref 0.0–3.0)
Eosinophils Absolute: 0.1 10*3/uL (ref 0.0–0.7)
Eosinophils Relative: 1.2 % (ref 0.0–5.0)
HCT: 38.8 % (ref 36.0–46.0)
Hemoglobin: 12.1 g/dL (ref 12.0–15.0)
Lymphocytes Relative: 19.9 % (ref 12.0–46.0)
Lymphs Abs: 1 10*3/uL (ref 0.7–4.0)
MCHC: 31.2 g/dL (ref 30.0–36.0)
MCV: 76.1 fl — ABNORMAL LOW (ref 78.0–100.0)
Monocytes Absolute: 0.7 10*3/uL (ref 0.1–1.0)
Monocytes Relative: 12.6 % — ABNORMAL HIGH (ref 3.0–12.0)
Neutro Abs: 3.4 10*3/uL (ref 1.4–7.7)
Neutrophils Relative %: 65 % (ref 43.0–77.0)
Platelets: 153 10*3/uL (ref 150.0–400.0)
RBC: 5.1 Mil/uL (ref 3.87–5.11)
RDW: 26.5 % — ABNORMAL HIGH (ref 11.5–15.5)
WBC: 5.2 10*3/uL (ref 4.0–10.5)

## 2019-05-23 LAB — VITAMIN B12: Vitamin B-12: >1500 pg/mL — ABNORMAL HIGH (ref 211–911)

## 2019-05-23 MED ORDER — CYANOCOBALAMIN 1000 MCG/ML IJ SOLN
1000.0000 ug | Freq: Once | INTRAMUSCULAR | Status: AC
  Administered 2019-05-23: 1000 ug via INTRAMUSCULAR

## 2019-05-23 MED ORDER — HYOSCYAMINE SULFATE 0.125 MG SL SUBL: 0.1250 mg | SUBLINGUAL_TABLET | Freq: Four times a day (QID) | SUBLINGUAL | 2 refills | Status: DC | PRN

## 2019-05-23 NOTE — Progress Notes (Signed)
Subjective:     Patient ID: [name], female   DOB: Jul 31, 1932, 86 y.o.   MRN: [id number]  HPI [name] states she is having some "stomach issues".  She has history of reported IBS symptoms.  She has had remote history of lymphocytic colitis.  She has frequent stools recently up to 3/day but none watery and only occasionally loose.  No bloody stools.  No nausea or vomiting.  Slight decreased appetite.  She states that she has diffuse intermittent cramping symptoms.  She has taken Levsin in the past but is currently out.  No reported fevers or chills.  No dysuria.  She has B12 deficiency history and requesting repeat levels.  She has history of anemia and is requesting repeat CBC.  No recent dizziness.  Blood pressures been stable.  Past Medical History:  Diagnosis Date  . Allergy   . Anemia, pernicious   . Arthritis    bursitis   . Atrophic gastritis   . B12 deficiency   . CAD (coronary artery disease)   . Carotid artery stenosis   . Cataract   . Diarrhea    on and off for months  . Family history of adverse reaction to anesthesia    extreme nausea brother and sister  . Fibromyalgia   . Gallbladder polyp   . GERD (gastroesophageal reflux disease)   . Heart murmur   . History of hiatal hernia   . History of kidney stones   . HLD (hyperlipidemia)   . HTN (hypertension)   . Hypothyroidism   . Irritable bowel syndrome (IBS)   . Median arcuate ligament syndrome ([place])   . Osteoporosis   . PVD (peripheral vascular disease) ([place])   . Tubular adenoma of colon 08/2013   with focal high grade dysplasia   Past Surgical History:  Procedure Laterality Date  . ABDOMINAL HYSTERECTOMY    . APPENDECTOMY    . BIOPSY THYROID    . cataract surgery    . CHOLECYSTECTOMY N/A 10/24/2014   Procedure: MICROSCOPIC  CHOLECYSTECTOMY;  Surgeon: Ralene Ok, MD;  Location: [place];  Service: General;  Laterality: N/A;  . COLONOSCOPY    . CORONARY ARTERY BYPASS GRAFT  07  . ESOPHAGEAL  MANOMETRY N/A 08/02/2012   Procedure: ESOPHAGEAL MANOMETRY (EM);  Surgeon: Sable Feil, MD;  [facility];  Service: Endoscopy;  Laterality: N/A;  . ESOPHAGOGASTRODUODENOSCOPY (EGD) WITH PROPOFOL N/A 04/07/2019   Procedure: ESOPHAGOGASTRODUODENOSCOPY (EGD) WITH PROPOFOL;  Surgeon: Milus Banister, MD;  [facility];  Service: Endoscopy;  Laterality: N/A;  . HEMOSTASIS CLIP PLACEMENT  04/07/2019   Procedure: HEMOSTASIS CLIP PLACEMENT;  Surgeon: Milus Banister, MD;  [facility];  Service: Endoscopy;;  . HOT HEMOSTASIS N/A 04/07/2019   Procedure: HOT HEMOSTASIS (ARGON PLASMA COAGULATION/BICAP);  Surgeon: Milus Banister, MD;  Location: [facility];  Service: Endoscopy;  Laterality: N/A;  . POLYPECTOMY  04/07/2019   Procedure: POLYPECTOMY;  Surgeon: Milus Banister, MD;  [facility];  Service: Endoscopy;;  . [name]  04/07/2019   Procedure: [name];  Surgeon: Milus Banister, MD;  [facility];  Service: Endoscopy;;  . THYROID LOBECTOMY    . UPPER GASTROINTESTINAL ENDOSCOPY      reports that she has never smoked. She has never used smokeless tobacco. She reports that she does not drink alcohol or use drugs. family history includes Breast cancer in an other family member; Cholelithiasis in her mother; Colon cancer in her brother; Diabetes in her  sister; Heart disease in her mother; Lung cancer in her father and sister; Prostate cancer in her brother; Stomach cancer in her cousin. Allergies  Allergen Reactions  . Actos [Pioglitazone] Swelling    Swelling  . Sulfonamide Derivatives Swelling  . Adhesive [Tape] Other (See Comments)    WITH PROLONGED EXPOSURE (REDNESS/ITCHING)  . Codeine Nausea And Vomiting     Review of Systems  Constitutional: Negative for chills and fever.  Respiratory: Negative for cough and shortness of breath.   Cardiovascular: Negative for chest pain.  Gastrointestinal: Positive for abdominal  pain. Negative for abdominal distention, anal bleeding, blood in stool, constipation, nausea and vomiting.  Genitourinary: Negative for dysuria.       Objective:   Physical Exam Vitals reviewed.  Constitutional:      Appearance: Normal appearance.  Cardiovascular:     Comments: Irregular rhythm but rate controlled Pulmonary:     Effort: Pulmonary effort is normal.     Breath sounds: Normal breath sounds.  Abdominal:     General: There is no distension.     Palpations: Abdomen is soft. There is no mass.     Tenderness: There is no abdominal tenderness. There is no guarding or rebound.  Musculoskeletal:     Right lower leg: No edema.     Left lower leg: No edema.  Neurological:     Mental Status: She is alert.        Assessment:     #1 diffuse abdominal pain.  Question IBS related.  She has had similar symptoms in the past that are responded to Levsin.  Nonacute abdomen and no red flags such as fever, vomiting, bloody stools, abdominal distention, or any localizing abdominal pain  #2 history of B12 deficiency  #3 history of iron deficiency anemia    Plan:     -Refill Levsin for as needed use -Recommend follow-up labs with B12, CBC -Reviewed things to watch for in terms of abdominal pain and follow-up immediately for any fever or other symptoms above  [name] MD [facility] at [facility]

## 2019-05-23 NOTE — Addendum Note (Signed)
Addended by: Anibal Henderson on: 05/23/2019 02:56 PM   Modules accepted: Orders

## 2019-05-23 NOTE — Patient Instructions (Signed)
Follow up for any fever, localizing abdominal pain, abdominal distension, or vomiting.

## 2019-05-31 DIAGNOSIS — Z8639 Personal history of other endocrine, nutritional and metabolic disease: Secondary | ICD-10-CM | POA: Diagnosis not present

## 2019-05-31 DIAGNOSIS — R7309 Other abnormal glucose: Secondary | ICD-10-CM | POA: Diagnosis not present

## 2019-05-31 DIAGNOSIS — M810 Age-related osteoporosis without current pathological fracture: Secondary | ICD-10-CM | POA: Diagnosis not present

## 2019-06-10 DIAGNOSIS — J300 Vasomotor rhinitis: Secondary | ICD-10-CM | POA: Diagnosis not present

## 2019-06-10 DIAGNOSIS — J301 Allergic rhinitis due to pollen: Secondary | ICD-10-CM | POA: Diagnosis not present

## 2019-06-10 DIAGNOSIS — R062 Wheezing: Secondary | ICD-10-CM | POA: Diagnosis not present

## 2019-06-10 DIAGNOSIS — K219 Gastro-esophageal reflux disease without esophagitis: Secondary | ICD-10-CM | POA: Diagnosis not present

## 2019-06-21 ENCOUNTER — Other Ambulatory Visit: Payer: Self-pay

## 2019-06-22 ENCOUNTER — Ambulatory Visit: Payer: Medicare Other

## 2019-06-22 ENCOUNTER — Ambulatory Visit (INDEPENDENT_AMBULATORY_CARE_PROVIDER_SITE_OTHER): Payer: Medicare Other | Admitting: Family Medicine

## 2019-06-22 ENCOUNTER — Encounter: Payer: Self-pay | Admitting: Family Medicine

## 2019-06-22 VITALS — BP 120/78 | HR 69 | Temp 97.1°F | Ht 64.0 in | Wt 99.1 lb

## 2019-06-22 DIAGNOSIS — Z0000 Encounter for general adult medical examination without abnormal findings: Secondary | ICD-10-CM | POA: Diagnosis not present

## 2019-06-22 DIAGNOSIS — E538 Deficiency of other specified B group vitamins: Secondary | ICD-10-CM

## 2019-06-22 MED ORDER — CYANOCOBALAMIN 1000 MCG/ML IJ SOLN
1000.0000 ug | Freq: Once | INTRAMUSCULAR | Status: AC
  Administered 2019-06-22: 1000 ug via INTRAMUSCULAR

## 2019-06-22 NOTE — Patient Instructions (Signed)
Preventive Care 84 Years and Older, Female Preventive care refers to lifestyle choices and visits with your health care provider that can promote health and wellness. This includes:  A yearly physical exam. This is also called an annual well check.  Regular dental and eye exams.  Immunizations.  Screening for certain conditions.  Healthy lifestyle choices, such as diet and exercise. What can I expect for my preventive care visit? Physical exam Your health care provider will check:  Height and weight. These may be used to calculate body mass index (BMI), which is a measurement that tells if you are at a healthy weight.  Heart rate and blood pressure.  Your skin for abnormal spots. Counseling Your health care provider may ask you questions about:  Alcohol, tobacco, and drug use.  Emotional well-being.  Home and relationship well-being.  Sexual activity.  Eating habits.  History of falls.  Memory and ability to understand (cognition).  Work and work Statistician.  Pregnancy and menstrual history. What immunizations do I need?  Influenza (flu) vaccine  This is recommended every year. Tetanus, diphtheria, and pertussis (Tdap) vaccine  You may need a Td booster every 10 years. Varicella (chickenpox) vaccine  You may need this vaccine if you have not already been vaccinated. Zoster (shingles) vaccine  You may need this after age 84. Pneumococcal conjugate (PCV13) vaccine  One dose is recommended after age 84. Pneumococcal polysaccharide (PPSV23) vaccine  One dose is recommended after age 84. Measles, mumps, and rubella (MMR) vaccine  You may need at least one dose of MMR if you were born in 1957 or later. You may also need a second dose. Meningococcal conjugate (MenACWY) vaccine  You may need this if you have certain conditions. Hepatitis A vaccine  You may need this if you have certain conditions or if you travel or work in places where you may be exposed  to hepatitis A. Hepatitis B vaccine  You may need this if you have certain conditions or if you travel or work in places where you may be exposed to hepatitis B. Haemophilus influenzae type b (Hib) vaccine  You may need this if you have certain conditions. You may receive vaccines as individual doses or as more than one vaccine together in one shot (combination vaccines). Talk with your health care provider about the risks and benefits of combination vaccines. What tests do I need? Blood tests  Lipid and cholesterol levels. These may be checked every 5 years, or more frequently depending on your overall health.  Hepatitis C test.  Hepatitis B test. Screening  Lung cancer screening. You may have this screening every year starting at age 84 if you have a 30-pack-year history of smoking and currently smoke or have quit within the past 15 years.  Colorectal cancer screening. All adults should have this screening starting at age 84 and continuing until age 15. Your health care provider may recommend screening at age 84 if you are at increased risk. You will have tests every 1-10 years, depending on your results and the type of screening test.  Diabetes screening. This is done by checking your blood sugar (glucose) after you have not eaten for a while (fasting). You may have this done every 1-3 years.  Mammogram. This may be done every 1-2 years. Talk with your health care provider about how often you should have regular mammograms.  BRCA-related cancer screening. This may be done if you have a family history of breast, ovarian, tubal, or peritoneal cancers.  Other tests  Sexually transmitted disease (STD) testing.  Bone density scan. This is done to screen for osteoporosis. You may have this done starting at age 84. Follow these instructions at home: Eating and drinking  Eat a diet that includes fresh fruits and vegetables, whole grains, lean protein, and low-fat dairy products. Limit  your intake of foods with high amounts of sugar, saturated fats, and salt.  Take vitamin and mineral supplements as recommended by your health care provider.  Do not drink alcohol if your health care provider tells you not to drink.  If you drink alcohol: ? Limit how much you have to 0-1 drink a day. ? Be aware of how much alcohol is in your drink. In the [place] one drink equals one 12 oz bottle of beer (355 mL), one 5 oz glass of wine (148 mL), or one 1 oz glass of hard liquor (44 mL). Lifestyle  Take daily care of your teeth and gums.  Stay active. Exercise for at least 30 minutes on 5 or more days each week.  Do not use any products that contain nicotine or tobacco, such as cigarettes, e-cigarettes, and chewing tobacco. If you need help quitting, ask your health care provider.  If you are sexually active, practice safe sex. Use a condom or other form of protection in order to prevent STIs (sexually transmitted infections).  Talk with your health care provider about taking a low-dose aspirin or statin. What's next?  Go to your health care provider once a year for a well check visit.  Ask your health care provider how often you should have your eyes and teeth checked.  Stay up to date on all vaccines. This information is not intended to replace advice given to you by your health care provider. Make sure you discuss any questions you have with your health care provider. Document Revised: 04/08/2018 Document Reviewed: 04/08/2018 Elsevier Patient Education  2020 [employer].

## 2019-06-22 NOTE — Progress Notes (Signed)
Subjective:     Patient ID: [name], female   DOB: 02-01-1933, 86 y.o.   MRN: [id number]  HPI   [name] is seen for Medicare subsequent annual wellness visit.  Her chronic problems include history of CAD, hypertension, atrial fibrillation, lymphocytic colitis history, hypothyroidism, osteoporosis, B12 deficiency, hyperlipidemia.  She is followed by endocrinology and cardiology.  She is due for repeat B12 injection today.   No recent falls.  She had eye exam during the past few months.  She has history of macular degeneration  Past Medical History:  Diagnosis Date  . Allergy   . Anemia, pernicious   . Arthritis    bursitis   . Atrophic gastritis   . B12 deficiency   . CAD (coronary artery disease)   . Carotid artery stenosis   . Cataract   . Diarrhea    on and off for months  . Family history of adverse reaction to anesthesia    extreme nausea brother and sister  . Fibromyalgia   . Gallbladder polyp   . GERD (gastroesophageal reflux disease)   . Heart murmur   . History of hiatal hernia   . History of kidney stones   . HLD (hyperlipidemia)   . HTN (hypertension)   . Hypothyroidism   . Irritable bowel syndrome (IBS)   . Median arcuate ligament syndrome ([place])   . Osteoporosis   . PVD (peripheral vascular disease) ([place])   . Tubular adenoma of colon 08/2013   with focal high grade dysplasia   Past Surgical History:  Procedure Laterality Date  . ABDOMINAL HYSTERECTOMY    . APPENDECTOMY    . BIOPSY THYROID    . cataract surgery    . CHOLECYSTECTOMY N/A 10/24/2014   Procedure: MICROSCOPIC  CHOLECYSTECTOMY;  Surgeon: Ralene Ok, MD;  Location: [place];  Service: General;  Laterality: N/A;  . COLONOSCOPY    . CORONARY ARTERY BYPASS GRAFT  07  . ESOPHAGEAL MANOMETRY N/A 08/02/2012   Procedure: ESOPHAGEAL MANOMETRY (EM);  Surgeon: Sable Feil, MD;  Location: [facility];  Service: Endoscopy;  Laterality: N/A;  . ESOPHAGOGASTRODUODENOSCOPY (EGD) WITH PROPOFOL N/A  04/07/2019   Procedure: ESOPHAGOGASTRODUODENOSCOPY (EGD) WITH PROPOFOL;  Surgeon: Milus Banister, MD;  Location: [facility];  Service: Endoscopy;  Laterality: N/A;  . HEMOSTASIS CLIP PLACEMENT  04/07/2019   Procedure: HEMOSTASIS CLIP PLACEMENT;  Surgeon: Milus Banister, MD;  [facility]: [facility];  Service: Endoscopy;;  . HOT HEMOSTASIS N/A 04/07/2019   Procedure: HOT HEMOSTASIS (ARGON PLASMA COAGULATION/BICAP);  Surgeon: Milus Banister, MD;  Location: [facility];  Service: Endoscopy;  Laterality: N/A;  . POLYPECTOMY  04/07/2019   Procedure: POLYPECTOMY;  Surgeon: Milus Banister, MD;  [facility];  Service: Endoscopy;;  . [name]  04/07/2019   Procedure: [name];  Surgeon: Milus Banister, MD;  Location: [facility];  Service: Endoscopy;;  . THYROID LOBECTOMY    . UPPER GASTROINTESTINAL ENDOSCOPY      reports that she has never smoked. She has never used smokeless tobacco. She reports that she does not drink alcohol or use drugs. family history includes Breast cancer in an other family member; Cholelithiasis in her mother; Colon cancer in her brother; Diabetes in her sister; Heart disease in her mother; Lung cancer in her father and sister; Prostate cancer in her brother; Stomach cancer in her cousin. Allergies  Allergen Reactions  . Actos [Pioglitazone] Swelling    Swelling  . Sulfonamide Derivatives Swelling  . Adhesive [Tape] Other (See  Comments)    WITH PROLONGED EXPOSURE (REDNESS/ITCHING)  . Codeine Nausea And Vomiting     1.  Risk factors based on Past Medical , Social, and Family history reviewed and as indicated above with no changes  2.  Limitations in physical activities None.  No recent falls.  No balance problems  Feeling srtonger past few months.   3.  Depression/mood No active depression or anxiety issues  PHQ-2 =0  4.  Hearing No defiits  5.  ADLs independent in all.  Lives with husband    Independent in all ADLs.    6.   Cognitive function (orientation to time and place, language, writing, speech,memory) no short or long term memory issues.  Language and judgement intact.  7.  Home Safety no issues  8.  Height, weight, and visual acuity.all stable.  She is followed by ophthalmology regularly for comprehensive eye exams  Wt Readings from Last 3 Encounters:  06/22/19 99 lb 1.6 oz (45 kg)  05/23/19 99 lb (44.9 kg)  04/19/19 100 lb 4.8 oz (45.5 kg)   Ht Readings from Last 3 Encounters:  06/22/19 5\' 4"  (1.626 m)  05/23/19 5\' 3"  (1.6 m)  04/19/19 5\' 3"  (1.6 m)      9.  Counseling discussed Counseled regarding age and gender appropriate preventative screenings and immunizations.  10. Recommendation of preventive services.  Vaccines all up to date.  11. Labs based on risk factors- none at this time  12. Care Plan- as below  13. Other Providers PCPs Type  Mauri Temkin, Alinda Sierras, MD General  Sherren Mocha, MD Cardiology   Other Patient Care Team Members Specialty  Sherren Mocha, MD Cardiology  Unice Bailey, MD Rheumatology  Delrae Rend, MD Endocrinology  Shon Hough, MD Ophthalmology  Barbaraann Rondo, MD Gynecology  Rolm Bookbinder, MD Dermatology   Visit Treatment Team    14. Written schedule of screening/prevention services given to patient.  Health Maintenance  Topic Date Due  . TETANUS/TDAP  06/21/2020 (Originally 10/29/1951)  . MAMMOGRAM  12/23/2019  . INFLUENZA VACCINE  Completed  . DEXA SCAN  Completed  . PNA vac Low Risk Adult  Completed      Review of Systems  Constitutional: Negative for fatigue and unexpected weight change.  Eyes: Negative for visual disturbance.  Respiratory: Negative for cough, chest tightness, shortness of breath and wheezing.   Cardiovascular: Negative for chest pain, palpitations and leg swelling.  Genitourinary: Negative for dysuria.  Neurological: Negative for dizziness, seizures, syncope, weakness, light-headedness and headaches.        Objective:   Physical Exam Vitals reviewed.  Constitutional:      General: She is not in acute distress.    Appearance: She is not ill-appearing.  HENT:     Right Ear: Ear canal normal.     Left Ear: Ear canal normal.  Cardiovascular:     Rate and Rhythm: Normal rate.  Pulmonary:     Effort: Pulmonary effort is normal.     Breath sounds: Normal breath sounds.  Abdominal:     Comments: Normal bowel sounds.  Soft tissue mass (chronic) upper mid abdomen near prior surgical incision -?seroma.  No organomegaly.  No worrisome masses.    Musculoskeletal:     Cervical back: Neck supple.     Right lower leg: No edema.     Left lower leg: No edema.  Skin:    Findings: No rash.  Neurological:     Mental Status: She is alert.  Psychiatric:  Mood and Affect: Mood normal.        Thought Content: Thought content normal.        Assessment:     Medicare subsequent annual wellness visit.    Plan:     -Health maintenance reviewed and her immunizations are up-to-date  -B12 injection given  -Discussed fall prevention.  We recommend she try to engage in more consistent quadricep strengthening with exercise bike  [name] MD [facility] at [facility]

## 2019-07-04 DIAGNOSIS — Z961 Presence of intraocular lens: Secondary | ICD-10-CM | POA: Diagnosis not present

## 2019-07-04 DIAGNOSIS — H35453 Secondary pigmentary degeneration, bilateral: Secondary | ICD-10-CM | POA: Diagnosis not present

## 2019-07-04 DIAGNOSIS — H353112 Nonexudative age-related macular degeneration, right eye, intermediate dry stage: Secondary | ICD-10-CM | POA: Diagnosis not present

## 2019-07-04 DIAGNOSIS — H353121 Nonexudative age-related macular degeneration, left eye, early dry stage: Secondary | ICD-10-CM | POA: Diagnosis not present

## 2019-07-04 DIAGNOSIS — H35363 Drusen (degenerative) of macula, bilateral: Secondary | ICD-10-CM | POA: Diagnosis not present

## 2019-07-06 ENCOUNTER — Other Ambulatory Visit: Payer: Self-pay | Admitting: Family Medicine

## 2019-07-13 ENCOUNTER — Other Ambulatory Visit: Payer: Self-pay | Admitting: Family Medicine

## 2019-07-19 ENCOUNTER — Other Ambulatory Visit: Payer: Self-pay

## 2019-07-20 ENCOUNTER — Ambulatory Visit (INDEPENDENT_AMBULATORY_CARE_PROVIDER_SITE_OTHER): Payer: Medicare Other | Admitting: Family Medicine

## 2019-07-20 ENCOUNTER — Encounter: Payer: Self-pay | Admitting: Family Medicine

## 2019-07-20 ENCOUNTER — Ambulatory Visit (INDEPENDENT_AMBULATORY_CARE_PROVIDER_SITE_OTHER)
Admission: RE | Admit: 2019-07-20 | Discharge: 2019-07-20 | Disposition: A | Discharge status: Home / Self Care | Payer: Medicare Other | Source: Ambulatory Visit | Attending: Family Medicine | Admitting: Family Medicine

## 2019-07-20 VITALS — BP 124/68 | HR 91 | Temp 98.7°F | Wt 99.9 lb

## 2019-07-20 DIAGNOSIS — R519 Headache, unspecified: Secondary | ICD-10-CM | POA: Diagnosis not present

## 2019-07-20 DIAGNOSIS — M47812 Spondylosis without myelopathy or radiculopathy, cervical region: Secondary | ICD-10-CM

## 2019-07-20 NOTE — Progress Notes (Signed)
Subjective:     Patient ID: [name], female   DOB: 01-04-1933, 86 y.o.   MRN: [id number]  HPI Ms. Baiz has history of peripheral vascular disease, CAD, hypertension, atrial fibrillation, chronic anticoagulation with Eliquis, hypothyroidism, cervical spondylosis.  She has had some recent left temporal headache which she states is new.  She does not generally suffer from headaches in the past.  No history of migraines.  She does recall hitting her head on a cabinet door about 3 weeks ago but there was no loss of consciousness.  She is not sure exactly when her headache started.  Her headache is somewhat intermittent.  She has not had any visual changes.  Headache has consistently been left temporal area.  Yesterday she states she got up and felt fairly much to her baseline.  She did some housework and early in the afternoon after sitting down after lunch husband saw her slumped over in a chair.  She is not sure whether she fell asleep versus syncope.  She did not have any confusion afterwards.  She is felt somewhat lethargic since then.  She has not had any nausea or vomiting.  No visual changes.  No focal weakness.  No history of seizure.  No alteration in consciousness since then  Does have severe cervical spondylosis and has had some recent ongoing neck pains and stiff neck related to that.  She has past history of iron deficiency but most recent hemoglobin was normal.  She is on thyroid replacement and followed by endocrinology.  Past Medical History:  Diagnosis Date  . Allergy   . Anemia, pernicious   . Arthritis    bursitis   . Atrophic gastritis   . B12 deficiency   . CAD (coronary artery disease)   . Carotid artery stenosis   . Cataract   . Diarrhea    on and off for months  . Family history of adverse reaction to anesthesia    extreme nausea brother and sister  . Fibromyalgia   . Gallbladder polyp   . GERD (gastroesophageal reflux disease)   . Heart murmur   . History  of hiatal hernia   . History of kidney stones   . HLD (hyperlipidemia)   . HTN (hypertension)   . Hypothyroidism   . Irritable bowel syndrome (IBS)   . Median arcuate ligament syndrome ([place])   . Osteoporosis   . PVD (peripheral vascular disease) ([place])   . Tubular adenoma of colon 08/2013   with focal high grade dysplasia   Past Surgical History:  Procedure Laterality Date  . ABDOMINAL HYSTERECTOMY    . APPENDECTOMY    . BIOPSY THYROID    . cataract surgery    . CHOLECYSTECTOMY N/A 10/24/2014   Procedure: MICROSCOPIC  CHOLECYSTECTOMY;  Surgeon: Ralene Ok, MD;  Location: [place];  Service: General;  Laterality: N/A;  . COLONOSCOPY    . CORONARY ARTERY BYPASS GRAFT  07  . ESOPHAGEAL MANOMETRY N/A 08/02/2012   Procedure: ESOPHAGEAL MANOMETRY (EM);  Surgeon: Sable Feil, MD;  [facility];  Service: Endoscopy;  Laterality: N/A;  . ESOPHAGOGASTRODUODENOSCOPY (EGD) WITH PROPOFOL N/A 04/07/2019   Procedure: ESOPHAGOGASTRODUODENOSCOPY (EGD) WITH PROPOFOL;  Surgeon: Milus Banister, MD;  Location: [facility];  Service: Endoscopy;  Laterality: N/A;  . HEMOSTASIS CLIP PLACEMENT  04/07/2019   Procedure: HEMOSTASIS CLIP PLACEMENT;  Surgeon: Milus Banister, MD;  [facility];  Service: Endoscopy;;  . HOT HEMOSTASIS N/A 04/07/2019   Procedure: HOT HEMOSTASIS (  ARGON PLASMA COAGULATION/BICAP);  Surgeon: Milus Banister, MD;  Location: [facility];  Service: Endoscopy;  Laterality: N/A;  . POLYPECTOMY  04/07/2019   Procedure: POLYPECTOMY;  Surgeon: Milus Banister, MD;  [facility];  Service: Endoscopy;;  . [name]  04/07/2019   Procedure: [name];  Surgeon: Milus Banister, MD;  [facility];  Service: Endoscopy;;  . THYROID LOBECTOMY    . UPPER GASTROINTESTINAL ENDOSCOPY      reports that she has never smoked. She has never used smokeless tobacco. She reports that she does not drink alcohol or use drugs. family history includes  Breast cancer in an other family member; Cholelithiasis in her mother; Colon cancer in her brother; Diabetes in her sister; Heart disease in her mother; Lung cancer in her father and sister; Prostate cancer in her brother; Stomach cancer in her cousin. Allergies  Allergen Reactions  . Actos [Pioglitazone] Swelling    Swelling  . Sulfonamide Derivatives Swelling  . Adhesive [Tape] Other (See Comments)    WITH PROLONGED EXPOSURE (REDNESS/ITCHING)  . Codeine Nausea And Vomiting     Review of Systems  Constitutional: Negative for appetite change, chills, fever and unexpected weight change.  Eyes: Negative for visual disturbance.  Respiratory: Negative for cough and shortness of breath.   Cardiovascular: Negative for chest pain.  Gastrointestinal: Negative for abdominal pain, nausea and vomiting.  Musculoskeletal: Positive for neck pain.  Neurological: Positive for headaches. Negative for seizures.  Psychiatric/Behavioral: Negative for confusion.       Objective:   Physical Exam Vitals reviewed.  Constitutional:      Appearance: Normal appearance.  HENT:     Head: Normocephalic and atraumatic.     Comments: Cannot appreciate any hematomas or abrasions of the scalp.  No skull tenderness or skull deformity  No temporal artery tenderness    Right Ear: Tympanic membrane normal.     Left Ear: Tympanic membrane normal.  Eyes:     Extraocular Movements: Extraocular movements intact.     Pupils: Pupils are equal, round, and reactive to light.  Cardiovascular:     Comments: Irregular rhythm consistent with likely atrial fibrillation.  Rate controlled Pulmonary:     Effort: Pulmonary effort is normal.     Breath sounds: Normal breath sounds.  Musculoskeletal:     Cervical back: Neck supple.  Neurological:     General: No focal deficit present.     Mental Status: She is alert and oriented to person, place, and time.     Cranial Nerves: No cranial nerve deficit.     Motor: No  weakness.     Coordination: Coordination normal.     Gait: Gait normal.  Psychiatric:        Mood and Affect: Mood normal.        Assessment:     Ms. Montanaro presents with approximately 2 to 3-week history of some left temporal headache which is a new onset headache which is relatively mild but persistent along with question of some increasing fatigue symptoms.  Nonfocal neuro exam but concerning is the fact that she did have head injury with cabinet door a few weeks ago and is on Eliquis with possible brief alteration in consciousness yesterday    Plan:     -Check sed rate given her age and recent new onset headache.  She has not had any visual changes to suggest likely temporal arteritis  -Obtain CT head without contrast to further assess especially with her Eliquis therapy  -  Follow-up immediately for any confusion, progressive headache, or any focal neurologic symptoms  [name] MD [facility] at [facility]

## 2019-07-20 NOTE — Patient Instructions (Signed)
General Headache Without Cause A headache is pain or discomfort felt around the head or neck area. The specific cause of a headache may not be found. There are many causes and types of headaches. A few common ones are:  Tension headaches.  Migraine headaches.  Cluster headaches.  Chronic daily headaches. Follow these instructions at home: Watch your condition for any changes. Let your health care provider know about them. Take these steps to help with your condition: Managing pain      Take over-the-counter and prescription medicines only as told by your health care provider.  Lie down in a dark, quiet room when you have a headache.  If directed, put ice on your head and neck area: ? Put ice in a plastic bag. ? Place a towel between your skin and the bag. ? Leave the ice on for 20 minutes, 2-3 times per day.  If directed, apply heat to the affected area. Use the heat source that your health care provider recommends, such as a moist heat pack or a heating pad. ? Place a towel between your skin and the heat source. ? Leave the heat on for 20-30 minutes. ? Remove the heat if your skin turns bright red. This is especially important if you are unable to feel pain, heat, or cold. You may have a greater risk of getting burned.  Keep lights dim if bright lights bother you or make your headaches worse. Eating and drinking  Eat meals on a regular schedule.  If you drink alcohol: ? Limit how much you use to:  0-1 drink a day for women.  0-2 drinks a day for men. ? Be aware of how much alcohol is in your drink. In the [place] one drink equals one 12 oz bottle of beer (355 mL), one 5 oz glass of wine (148 mL), or one 1 oz glass of hard liquor (44 mL).  Stop drinking caffeine, or decrease the amount of caffeine you drink. General instructions   Keep a headache journal to help find out what may trigger your headaches. For example, write down: ? What you eat and drink. ? How much  sleep you get. ? Any change to your diet or medicines.  Try massage or other relaxation techniques.  Limit stress.  Sit up straight, and do not tense your muscles.  Do not use any products that contain nicotine or tobacco, such as cigarettes, e-cigarettes, and chewing tobacco. If you need help quitting, ask your health care provider.  Exercise regularly as told by your health care provider.  Sleep on a regular schedule. Get 7-9 hours of sleep each night, or the amount recommended by your health care provider.  Keep all follow-up visits as told by your health care provider. This is important. Contact a health care provider if:  Your symptoms are not helped by medicine.  You have a headache that is different from the usual headache.  You have nausea or you vomit.  You have a fever. Get help right away if:  Your headache becomes severe quickly.  Your headache gets worse after moderate to intense physical activity.  You have repeated vomiting.  You have a stiff neck.  You have a loss of vision.  You have problems with speech.  You have pain in the eye or ear.  You have muscular weakness or loss of muscle control.  You lose your balance or have trouble walking.  You feel faint or pass out.  You have confusion.    You have a seizure. Summary  A headache is pain or discomfort felt around the head or neck area.  There are many causes and types of headaches. In some cases, the cause may not be found.  Keep a headache journal to help find out what may trigger your headaches. Watch your condition for any changes. Let your health care provider know about them.  Contact a health care provider if you have a headache that is different from the usual headache, or if your symptoms are not helped by medicine.  Get help right away if your headache becomes severe, you vomit, you have a loss of vision, you lose your balance, or you have a seizure. This information is not  intended to replace advice given to you by your health care provider. Make sure you discuss any questions you have with your health care provider. Document Revised: 11/02/2017 Document Reviewed: 11/02/2017 Elsevier Patient Education  2020 [employer]  

## 2019-07-21 LAB — SEDIMENTATION RATE: Sed Rate: 13 mm/hr (ref 0–30)

## 2019-07-25 ENCOUNTER — Encounter (HOSPITAL_COMMUNITY): Payer: Self-pay

## 2019-07-25 ENCOUNTER — Other Ambulatory Visit: Payer: Self-pay

## 2019-07-25 ENCOUNTER — Emergency Department (HOSPITAL_COMMUNITY)
Admission: EM | Admit: 2019-07-25 | Discharge: 2019-07-25 | Disposition: A | Discharge status: Home / Self Care | Payer: Medicare Other | Attending: Emergency Medicine | Admitting: Emergency Medicine

## 2019-07-25 DIAGNOSIS — Z79899 Other long term (current) drug therapy: Secondary | ICD-10-CM | POA: Diagnosis not present

## 2019-07-25 DIAGNOSIS — I2510 Atherosclerotic heart disease of native coronary artery without angina pectoris: Secondary | ICD-10-CM | POA: Diagnosis not present

## 2019-07-25 DIAGNOSIS — N939 Abnormal uterine and vaginal bleeding, unspecified: Secondary | ICD-10-CM | POA: Insufficient documentation

## 2019-07-25 DIAGNOSIS — N3090 Cystitis, unspecified without hematuria: Secondary | ICD-10-CM | POA: Insufficient documentation

## 2019-07-25 DIAGNOSIS — E039 Hypothyroidism, unspecified: Secondary | ICD-10-CM | POA: Insufficient documentation

## 2019-07-25 DIAGNOSIS — Z7982 Long term (current) use of aspirin: Secondary | ICD-10-CM | POA: Diagnosis not present

## 2019-07-25 DIAGNOSIS — I10 Essential (primary) hypertension: Secondary | ICD-10-CM | POA: Diagnosis not present

## 2019-07-25 DIAGNOSIS — Z7901 Long term (current) use of anticoagulants: Secondary | ICD-10-CM | POA: Diagnosis not present

## 2019-07-25 LAB — WET PREP, GENITAL
Clue Cells Wet Prep HPF POC: NONE SEEN
Sperm: NONE SEEN
Trich, Wet Prep: NONE SEEN
WBC, Wet Prep HPF POC: NONE SEEN
Yeast Wet Prep HPF POC: NONE SEEN

## 2019-07-25 LAB — COMPREHENSIVE METABOLIC PANEL
ALT: 30 U/L (ref 0–44)
AST: 34 U/L (ref 15–41)
Albumin: 3.8 g/dL (ref 3.5–5.0)
Alkaline Phosphatase: 42 U/L (ref 38–126)
Anion gap: 11 (ref 5–15)
BUN: 19 mg/dL (ref 8–23)
CO2: 26 mmol/L (ref 22–32)
Calcium: 8.7 mg/dL — ABNORMAL LOW (ref 8.9–10.3)
Chloride: 106 mmol/L (ref 98–111)
Creatinine, Ser: 0.97 mg/dL (ref 0.44–1.00)
GFR calc Af Amer: >60 mL/min (ref >60)
GFR calc non Af Amer: 54 mL/min — ABNORMAL LOW (ref >60)
Glucose, Bld: 96 mg/dL (ref 70–99)
Potassium: 3.7 mmol/L (ref 3.5–5.1)
Sodium: 143 mmol/L (ref 135–145)
Total Bilirubin: 0.6 mg/dL (ref 0.3–1.2)
Total Protein: 6.3 g/dL — ABNORMAL LOW (ref 6.5–8.1)

## 2019-07-25 LAB — POC OCCULT BLOOD, ED: Fecal Occult Bld: NEGATIVE

## 2019-07-25 LAB — CBC WITH DIFFERENTIAL/PLATELET
Abs Immature Granulocytes: 0.02 10*3/uL (ref 0.00–0.07)
Basophils Absolute: 0 10*3/uL (ref 0.0–0.1)
Basophils Relative: 1 %
Eosinophils Absolute: 0.2 10*3/uL (ref 0.0–0.5)
Eosinophils Relative: 3 %
HCT: 42 % (ref 36.0–46.0)
Hemoglobin: 13.2 g/dL (ref 12.0–15.0)
Immature Granulocytes: 0 %
Lymphocytes Relative: 17 %
Lymphs Abs: 0.9 10*3/uL (ref 0.7–4.0)
MCH: 26.5 pg (ref 26.0–34.0)
MCHC: 31.4 g/dL (ref 30.0–36.0)
MCV: 84.3 fL (ref 80.0–100.0)
Monocytes Absolute: 0.6 10*3/uL (ref 0.1–1.0)
Monocytes Relative: 11 %
Neutro Abs: 3.8 10*3/uL (ref 1.7–7.7)
Neutrophils Relative %: 68 %
Platelets: 150 10*3/uL (ref 150–400)
RBC: 4.98 MIL/uL (ref 3.87–5.11)
RDW: 17.9 % — ABNORMAL HIGH (ref 11.5–15.5)
WBC: 5.5 10*3/uL (ref 4.0–10.5)
nRBC: 0 % (ref 0.0–0.2)

## 2019-07-25 LAB — PROTIME-INR
INR: 1.2 (ref 0.8–1.2)
Prothrombin Time: 15 seconds (ref 11.4–15.2)

## 2019-07-25 LAB — URINALYSIS, ROUTINE W REFLEX MICROSCOPIC
Bilirubin Urine: NEGATIVE
Glucose, UA: NEGATIVE mg/dL
Ketones, ur: NEGATIVE mg/dL
Nitrite: POSITIVE — AB
Protein, ur: NEGATIVE mg/dL
Specific Gravity, Urine: 1.004 — ABNORMAL LOW (ref 1.005–1.030)
pH: 6 (ref 5.0–8.0)

## 2019-07-25 MED ORDER — CEPHALEXIN 500 MG PO CAPS: 500.0000 mg | ORAL_CAPSULE | Freq: Three times a day (TID) | ORAL | 0 refills | Status: DC

## 2019-07-25 MED ORDER — CEPHALEXIN 500 MG PO CAPS
500.0000 mg | ORAL_CAPSULE | Freq: Once | ORAL | Status: AC
  Administered 2019-07-25: 500 mg via ORAL
  Filled 2019-07-25: qty 1

## 2019-07-25 MED ORDER — CEPHALEXIN 500 MG PO CAPS: 500.0000 mg | ORAL_CAPSULE | Freq: Once | ORAL | Status: DC

## 2019-07-25 NOTE — ED Triage Notes (Signed)
Arrived POV from home. Patient reports she noticed blood on tissue when wiping and bright red blood dripping in shower; no blood clots noticed. Patient also reports pressure in lower abdomen that is getting tighter. Patient was sent by PCP for further evaluation of possible bleeding from kidneys.

## 2019-07-25 NOTE — Discharge Instructions (Addendum)
We signed the ER for vaginal bleeding. The urine analysis reveals that you have some markers for infection.  Please take the antibiotics prescribed.  Bladder infection concerned because vaginal bleeding.  Otherwise, we recommend that you follow-up with your PCP in 1 week.  If you have persistent bleeding, worsening of the bleeding, new pain, fevers or chills please return to the ER.  It might be beneficial to also have your gynecologist appointment in the next 2 weeks or so.

## 2019-07-25 NOTE — ED Provider Notes (Signed)
[facility] DEPT Provider Note   CSN: [id number] Arrival date & time: 07/25/19  1857     History Chief Complaint  Patient presents with  . Vaginal Bleeding    possible bleeding from kidneys sent by PCP    [name] is a 86 y.o. female.  HPI     86 year old female comes in a chief complaint of vaginal bleeding.  Patient has history of CAD, A. fib and is on Eliquis.  She is also status post hysterectomy without oophorectomy.  Patient reports that she went to the bathroom and noted blood in her urine all.  She did not see any blood when she was cleaning up, and did not think much of the bleed.  Subsequently she went to shower and started having blood ooze down.  She thinks that the bleeding was vaginal, but she is not [id number] certain.  She has never had rectal bleed before.  She called her PCP who advised her to come to the ER. Patient does admit to urinary frequency but denies any burning with urination or noticing any blood in the urine prior to today.  Past Medical History:  Diagnosis Date  . Allergy   . Anemia, pernicious   . Arthritis    bursitis   . Atrophic gastritis   . B12 deficiency   . CAD (coronary artery disease)   . Carotid artery stenosis   . Cataract   . Diarrhea    on and off for months  . Family history of adverse reaction to anesthesia    extreme nausea brother and sister  . Fibromyalgia   . Gallbladder polyp   . GERD (gastroesophageal reflux disease)   . Heart murmur   . History of hiatal hernia   . History of kidney stones   . HLD (hyperlipidemia)   . HTN (hypertension)   . Hypothyroidism   . Irritable bowel syndrome (IBS)   . Median arcuate ligament syndrome ([place])   . Osteoporosis   . PVD (peripheral vascular disease) ([place])   . Tubular adenoma of colon 08/2013   with focal high grade dysplasia    Patient Active Problem List   Diagnosis Date Noted  . Gastric polyp   . Lymphocytic colitis 09/15/2018  .  B12 deficiency 06/29/2018  . Persistent atrial fibrillation ([place]) 04/05/2018  . Loose stools 07/06/2017  . Rectal bleeding 07/06/2017  . Generalized abdominal pain 07/06/2017  . Cervical spondylosis 03/11/2017  . Loss of weight 11/18/2013  . Nausea alone 11/18/2013  . Abdominal pain, epigastric 11/18/2013  . Anemia, iron deficiency 01/27/2013  . Temporomandibular joint-pain-dysfunction syndrome (TMJ) 06/28/2012  . Other abnormal glucose 08/25/2011  . Allergic rhinitis, cause unspecified 08/25/2011  . Right-sided chest wall pain 08/25/2011  . Murmur 09/20/2010  . Incisional hernia 09/20/2010  . HERNIATED DISC 01/02/2010  . CAROTID ARTERY DISEASE 08/27/2009  . Osteoporosis 06/07/2009  . BRADYCARDIA 02/05/2009  . BENIGN POSITIONAL VERTIGO 09/27/2008  . CELIAC ARTERY COMPRESSION SYNDROME 10/25/2007  . HYPOTHYROIDISM 01/08/2007  . Hyperlipidemia 01/08/2007  . Essential hypertension 01/08/2007  . Coronary atherosclerosis 01/08/2007  . PERIPHERAL VASCULAR DISEASE 01/08/2007  . ATROPHIC GASTRITIS 01/08/2007  . ANEMIA, PERNICIOUS, HX OF 01/08/2007  . RENAL CALCULUS, HX OF 01/08/2007  . HYSTERECTOMY, HX OF 01/08/2007  . APPENDECTOMY, HX OF 01/08/2007    Past Surgical History:  Procedure Laterality Date  . ABDOMINAL HYSTERECTOMY    . APPENDECTOMY    . BIOPSY THYROID    . cataract surgery    .  CHOLECYSTECTOMY N/A 10/24/2014   Procedure: MICROSCOPIC  CHOLECYSTECTOMY;  Surgeon: Ralene Ok, MD;  Location: [facility];  Service: General;  Laterality: N/A;  . COLONOSCOPY    . CORONARY ARTERY BYPASS GRAFT  07  . ESOPHAGEAL MANOMETRY N/A 08/02/2012   Procedure: ESOPHAGEAL MANOMETRY (EM);  Surgeon: Sable Feil, MD;  Location: [facility];  Service: Endoscopy;  Laterality: N/A;  . ESOPHAGOGASTRODUODENOSCOPY (EGD) WITH PROPOFOL N/A 04/07/2019   Procedure: ESOPHAGOGASTRODUODENOSCOPY (EGD) WITH PROPOFOL;  Surgeon: Milus Banister, MD;  Location: [facility];  Service: Endoscopy;   Laterality: N/A;  . HEMOSTASIS CLIP PLACEMENT  04/07/2019   Procedure: HEMOSTASIS CLIP PLACEMENT;  Surgeon: Milus Banister, MD;  Location: [facility];  Service: Endoscopy;;  . HOT HEMOSTASIS N/A 04/07/2019   Procedure: HOT HEMOSTASIS (ARGON PLASMA COAGULATION/BICAP);  Surgeon: Milus Banister, MD;  Location: [facility];  Service: Endoscopy;  Laterality: N/A;  . POLYPECTOMY  04/07/2019   Procedure: POLYPECTOMY;  Surgeon: Milus Banister, MD;  Location: [facility];  Service: Endoscopy;;  . [name]  04/07/2019   Procedure: [name];  Surgeon: Milus Banister, MD;  Location: [facility];  Service: Endoscopy;;  . THYROID LOBECTOMY    . UPPER GASTROINTESTINAL ENDOSCOPY       OB History   No obstetric history on file.     Family History  Problem Relation Age of Onset  . Heart disease Mother   . Cholelithiasis Mother   . Prostate cancer Brother   . Colon cancer Brother   . Breast cancer Other        niece - breast cancer  . Diabetes Sister   . Lung cancer Father   . Lung cancer Sister   . Stomach cancer Cousin   . Esophageal cancer Neg Hx   . Rectal cancer Neg Hx     Social History   Tobacco Use  . Smoking status: Never Smoker  . Smokeless tobacco: Never Used  Substance Use Topics  . Alcohol use: No    Alcohol/week: 0.0 standard drinks  . Drug use: No    Home Medications Prior to Admission medications   Medication Sig Start Date End Date Taking? Authorizing Provider  acetaminophen (TYLENOL) 500 MG tablet Take 500 mg by mouth every 6 (six) hours as needed (pain).    [provider]  aspirin 81 MG EC tablet aspirin 81 mg tablet,delayed release  Take 1 tablet every day by oral route.    [provider]  atorvastatin (LIPITOR) 80 MG tablet TAKE 1 TABLET BY MOUTH DAILY AT BEDTIME 05/02/19   Burchette, Alinda Sierras, MD  Calcium Carbonate-Vitamin D (CALTRATE 600+D PO) Take 1 tablet by mouth daily with lunch.     [provider]    carvedilol (COREG) 6.25 MG tablet TAKE 1/2 TABLET BY MOUTH TWICE DAILY 07/13/19   Burchette, Alinda Sierras, MD  cyanocobalamin (,VITAMIN B-12,) 1000 MCG/ML injection Inject 1,000 mcg into the muscle every 30 (thirty) days.    [provider]  ELIQUIS 2.5 MG TABS tablet TAKE 1 TABLET BY MOUTH TWICE DAILY 07/06/19   Burchette, Alinda Sierras, MD  Ferrous Sulfate (IRON) 28 MG TABS Take 1 tablet by mouth daily.    [provider]  fluticasone (FLONASE) 50 MCG/ACT nasal spray Place 1 spray daily as needed into both nostrils for allergies or rhinitis.    [provider]  HM LIDOCAINE PATCH EX Place 1 patch onto the skin daily as needed (pain.).    [provider]  hyoscyamine (LEVSIN  SL) 0.125 MG SL tablet Place 1 tablet (0.125 mg total) under the tongue every 6 (six) hours as needed. 05/23/19   Burchette, Alinda Sierras, MD  ipratropium (ATROVENT) 0.03 % nasal spray  06/13/19   [provider]  levothyroxine (SYNTHROID, LEVOTHROID) 50 MCG tablet Take 50 mcg by mouth every evening.     [provider]  LIDOCAINE EX Apply 1 application topically 3 (three) times daily as needed (pain). Roll-On (otc)    [provider]  losartan (COZAAR) 100 MG tablet TAKE 1 TABLET BY MOUTH DAILY 05/02/19   Burchette, Alinda Sierras, MD  Multiple Vitamins-Minerals (PRESERVISION AREDS 2 PO) Take 1 tablet by mouth 2 (two) times daily.    [provider]  Polyethyl Glycol-Propyl Glycol (SYSTANE FREE OP) Place 1 drop into both eyes 3 (three) times daily as needed (dry/irritated eyes.).    [provider]  sodium chloride (OCEAN) 0.65 % nasal spray Place 1 spray into both nostrils 3 (three) times daily as needed for congestion.     [provider]  spironolactone (ALDACTONE) 25 MG tablet TAKE 1 TABLET BY MOUTH ONCE DAILY Patient taking differently: Take 25 mg by mouth daily.  01/04/19   Burchette, Alinda Sierras, MD  sucralfate (CARAFATE) 1 GM/10ML suspension TAKE 10 MLS (1 G  TOTAL) BY MOUTH 2 (TWO) TIMES DAILY. Patient taking differently: Take 1 g by mouth 2 (two) times daily as needed (nausea/pain.).  09/27/18   Milus Banister, MD  zoledronic acid (RECLAST) 5 MG/100ML SOLN injection Reclast    [provider]    Allergies    Actos [pioglitazone], Sulfonamide derivatives, Adhesive [tape], and Codeine  Review of Systems   Review of Systems  Constitutional: Positive for activity change. Negative for diaphoresis and unexpected weight change.  Respiratory: Negative for shortness of breath.   Cardiovascular: Negative for chest pain.  Gastrointestinal: Negative for nausea and vomiting.  Genitourinary: Positive for frequency. Negative for dysuria, flank pain, menstrual problem and pelvic pain.  Musculoskeletal: Negative for back pain.  Hematological: Bruises/bleeds easily.    Physical Exam Updated Vital Signs BP (!) 162/87   Pulse 80   Temp 98.9 F (37.2 C) (Oral)   Resp 16   Ht 5\' 4"  (1.626 m)   Wt 44.9 kg   SpO2 94%   BMI 16.99 kg/m   Physical Exam Vitals and nursing note reviewed.  Constitutional:      Appearance: She is well-developed.  HENT:     Head: Normocephalic and atraumatic.  Eyes:     Conjunctiva/sclera: Conjunctivae normal.     Pupils: Pupils are equal, round, and reactive to light.  Cardiovascular:     Rate and Rhythm: Normal rate and regular rhythm.     Heart sounds: Normal heart sounds.  Pulmonary:     Effort: Pulmonary effort is normal.  Abdominal:     General: Bowel sounds are normal. There is no distension.     Palpations: Abdomen is soft.     Tenderness: There is no abdominal tenderness.     Comments: Digital rectal exam did not reveal any melena and the stool is guaiac negative  Genitourinary:    Vagina: Normal.     Comments: External exam - normal, no lesions Speculum exam: Pt had one area of erythematous lesion as we were inserting the speculum.  There was some bright red blood in the vaginal vault, scant  amount. Musculoskeletal:     Cervical back: Normal range of motion and neck supple.  Skin:  General: Skin is warm and dry.  Neurological:     Mental Status: She is alert and oriented to person, place, and time.     ED Results / Procedures / Treatments   Labs (all labs ordered are listed, but only abnormal results are displayed) Labs Reviewed  CBC WITH DIFFERENTIAL/PLATELET - Abnormal; Notable for the following components:      Result Value   RDW 17.9 (*)    All other components within normal limits  COMPREHENSIVE METABOLIC PANEL - Abnormal; Notable for the following components:   Calcium 8.7 (*)    Total Protein 6.3 (*)    GFR calc non Af Amer 54 (*)    All other components within normal limits  URINALYSIS, ROUTINE W REFLEX MICROSCOPIC - Abnormal; Notable for the following components:   Specific Gravity, Urine 1.004 (*)    Hgb urine dipstick MODERATE (*)    Nitrite POSITIVE (*)    Leukocytes,Ua SMALL (*)    Bacteria, UA FEW (*)    All other components within normal limits  WET PREP, GENITAL  PROTIME-INR  POC OCCULT BLOOD, ED  TYPE AND SCREEN    EKG None  Radiology No results found.  Procedures Procedures (including critical care time)  Medications Ordered in ED Medications  cephALEXin (KEFLEX) capsule 500 mg (has no administration in time range)    ED Course  I have reviewed the triage vital signs and the nursing notes.  Pertinent labs & imaging results that were available during my care of the patient were reviewed by me and considered in my medical decision making (see chart for details).    MDM Rules/Calculators/A&P                      86 year old comes in w/ chief complaint of vaginal bleeding.  Patient has no significant medical history besides A. fib on Eliquis.  She has no prior history of rectal bleeding and she is status post hysterectomy with preserved ovaries.  On exam there was some blood in the vaginal vault.  There was an area within the  vaginal mucosa that was erythematous, there is no necrotic appearing lesion appreciated.  Digital rectal exam was negative for melena.  I suspect that patient likely had erosion/abrasion within her vaginal canal. I do not think CT scan of the pelvis would be helpful.  I discussed my findings with the patient and she is willing to be conservative and not get a CT scan right now.  She will follow up with her PCP.  She still sees gynecologist annually and has not seen them this year, so I advised her to follow-up with them within the next 2 to 4 weeks.  She has been advised to return to the ER if her symptoms get worse.   There is a remote possibility of tumor that has eroded through, but patient is reliable and I think we can proceed with CT scan if she has worsening of her symptoms.  11:05 PM UA has positive nitrites.  We will start her on Keflex.   Final Clinical Impression(s) / ED Diagnoses Final diagnoses:  Vaginal bleeding  Cystitis    Rx / DC Orders ED Discharge Orders    None       Varney Biles, MD 07/25/19 2305

## 2019-07-25 NOTE — ED Notes (Signed)
PT A/O and ambulatory upon discharge.  PT educated on prescriptions and follow up care.

## 2019-07-25 NOTE — ED Notes (Signed)
Labeled urine and culture specimen sent to lab. [employer]

## 2019-07-26 ENCOUNTER — Telehealth: Payer: Self-pay | Admitting: Family Medicine

## 2019-07-26 LAB — TYPE AND SCREEN
ABO/RH(D): A NEG
Antibody Screen: NEGATIVE

## 2019-07-26 LAB — ABO/RH: ABO/RH(D): A NEG

## 2019-07-26 NOTE — Telephone Encounter (Signed)
If the vaginal bleeding is only minimal would continue Eliquis but if this becomes more heavy (eg several pads per day needed) with hold.

## 2019-07-26 NOTE — Telephone Encounter (Signed)
Please advise 

## 2019-07-26 NOTE — Telephone Encounter (Signed)
Pt has been notified and states the bleeding has been slow

## 2019-07-26 NOTE — Telephone Encounter (Signed)
the patient wants to know if the ELIQUIS 2.5 MG TABS tablet maybe  cause her vaginal  bleeding; she has an appt on 08/01/2019 for f/u  from ED  should she continue the medication  call back on home number  [phone]

## 2019-07-27 ENCOUNTER — Other Ambulatory Visit: Payer: Self-pay | Admitting: Family Medicine

## 2019-08-01 ENCOUNTER — Ambulatory Visit (INDEPENDENT_AMBULATORY_CARE_PROVIDER_SITE_OTHER): Payer: Medicare Other | Admitting: Family Medicine

## 2019-08-01 ENCOUNTER — Other Ambulatory Visit: Payer: Self-pay

## 2019-08-01 ENCOUNTER — Encounter: Payer: Self-pay | Admitting: Family Medicine

## 2019-08-01 VITALS — BP 134/68 | HR 81 | Temp 98.3°F | Wt 99.1 lb

## 2019-08-01 DIAGNOSIS — R5383 Other fatigue: Secondary | ICD-10-CM

## 2019-08-01 DIAGNOSIS — E538 Deficiency of other specified B group vitamins: Secondary | ICD-10-CM | POA: Diagnosis not present

## 2019-08-01 DIAGNOSIS — N939 Abnormal uterine and vaginal bleeding, unspecified: Secondary | ICD-10-CM | POA: Diagnosis not present

## 2019-08-01 MED ORDER — CYANOCOBALAMIN 1000 MCG/ML IJ SOLN
1000.0000 ug | Freq: Once | INTRAMUSCULAR | Status: AC
  Administered 2019-08-01: 1000 ug via INTRAMUSCULAR

## 2019-08-01 NOTE — Progress Notes (Signed)
Subjective:     Patient ID: [name], female   DOB: July 09, 1932, 86 y.o.   MRN: [id number]  HPI   [name] is seen for ER follow-up.  She had presented with presumed vaginal bleeding recently.  She has had previous hysterectomy but has both ovaries.  Her fecal occult test was negative.  Denied any gross hematuria or bloody stools.  Hemoglobin 13.2 with INR 1.2.  She does take Eliquis for persistent atrial fibrillation.  There was one area of erythema in her vagina (per ER notes) but no evidence for any lesions or necrosis.  She has not had any further vaginal bleeding since ER visit.  Her urinalysis did reveal positive nitrites and she was started on Keflex- though no culture was done.  She had wet prep which was negative.  All labs from ER reviewed.  Chemistries were unremarkable  She denies any recent injuries.  No pelvic pain or vaginal pain.  She does have regular GYN and plans to see them.  She has had some nonspecific fatigue past couple days.  No malaise.  She had some intermittent abdominal cramps symptoms improved with Levsin.  Longstanding history of IBS type symptoms.  She has B12 deficiency and is due for repeat injection today.  Her headache from last visit has improved.  Sed rate was normal.  CT head showed no acute findings.  She had some evidence for left frontal sinus disease but has not had any consistent headaches in that region.  Past Medical History:  Diagnosis Date  . Allergy   . Anemia, pernicious   . Arthritis    bursitis   . Atrophic gastritis   . B12 deficiency   . CAD (coronary artery disease)   . Carotid artery stenosis   . Cataract   . Diarrhea    on and off for months  . Family history of adverse reaction to anesthesia    extreme nausea brother and sister  . Fibromyalgia   . Gallbladder polyp   . GERD (gastroesophageal reflux disease)   . Heart murmur   . History of hiatal hernia   . History of kidney stones   . HLD (hyperlipidemia)   . HTN  (hypertension)   . Hypothyroidism   . Irritable bowel syndrome (IBS)   . Median arcuate ligament syndrome ([place])   . Osteoporosis   . PVD (peripheral vascular disease) ([place])   . Tubular adenoma of colon 08/2013   with focal high grade dysplasia   Past Surgical History:  Procedure Laterality Date  . ABDOMINAL HYSTERECTOMY    . APPENDECTOMY    . BIOPSY THYROID    . cataract surgery    . CHOLECYSTECTOMY N/A 10/24/2014   Procedure: MICROSCOPIC  CHOLECYSTECTOMY;  Surgeon: Ralene Ok, MD;  Location: [facility];  Service: General;  Laterality: N/A;  . COLONOSCOPY    . CORONARY ARTERY BYPASS GRAFT  07  . ESOPHAGEAL MANOMETRY N/A 08/02/2012   Procedure: ESOPHAGEAL MANOMETRY (EM);  Surgeon: Sable Feil, MD;  Location: [facility];  Service: Endoscopy;  Laterality: N/A;  . ESOPHAGOGASTRODUODENOSCOPY (EGD) WITH PROPOFOL N/A 04/07/2019   Procedure: ESOPHAGOGASTRODUODENOSCOPY (EGD) WITH PROPOFOL;  Surgeon: Milus Banister, MD;  Location: [facility];  Service: Endoscopy;  Laterality: N/A;  . HEMOSTASIS CLIP PLACEMENT  04/07/2019   Procedure: HEMOSTASIS CLIP PLACEMENT;  Surgeon: Milus Banister, MD;  Location: [facility];  Service: Endoscopy;;  . HOT HEMOSTASIS N/A 04/07/2019   Procedure: HOT HEMOSTASIS (ARGON PLASMA COAGULATION/BICAP);  Surgeon: Ardis Hughs,  Melene Plan, MD;  Location: [facility];  Service: Endoscopy;  Laterality: N/A;  . POLYPECTOMY  04/07/2019   Procedure: POLYPECTOMY;  Surgeon: Milus Banister, MD;  Location: [facility];  Service: Endoscopy;;  . [name]  04/07/2019   Procedure: [name];  Surgeon: Milus Banister, MD;  Location: [facility];  Service: Endoscopy;;  . THYROID LOBECTOMY    . UPPER GASTROINTESTINAL ENDOSCOPY      reports that she has never smoked. She has never used smokeless tobacco. She reports that she does not drink alcohol or use drugs. family history includes Breast cancer in an other family member; Cholelithiasis in her mother; Colon  cancer in her brother; Diabetes in her sister; Heart disease in her mother; Lung cancer in her father and sister; Prostate cancer in her brother; Stomach cancer in her cousin. Allergies  Allergen Reactions  . Actos [Pioglitazone] Swelling    Swelling  . Sulfonamide Derivatives Swelling  . Adhesive [Tape] Other (See Comments)    WITH PROLONGED EXPOSURE (REDNESS/ITCHING)  . Codeine Nausea And Vomiting   Wt Readings from Last 3 Encounters:  08/01/19 99 lb 1.6 oz (45 kg)  07/25/19 99 lb (44.9 kg)  07/20/19 99 lb 14.4 oz (45.3 kg)      Review of Systems  Constitutional: Negative for appetite change and unexpected weight change.  Respiratory: Negative for cough and shortness of breath.   Cardiovascular: Negative for chest pain.  Genitourinary: Negative for difficulty urinating, dysuria, genital sores, hematuria, vaginal bleeding, vaginal discharge and vaginal pain.  Neurological: Negative for dizziness and syncope.       Objective:   Physical Exam Vitals reviewed.  Constitutional:      Appearance: Normal appearance.  Cardiovascular:     Rate and Rhythm: Normal rate.  Pulmonary:     Effort: Pulmonary effort is normal.     Breath sounds: Normal breath sounds.  Musculoskeletal:     Right lower leg: No edema.     Left lower leg: No edema.  Skin:    Comments: Nailbeds are pink.  Conjunctive are pink  Neurological:     Mental Status: She is alert.        Assessment:     #1 recent vaginal bleeding.  She has had previous hysterectomy.  Nonspecific area of erythema noted on exam (per ER notes) but she has had no spotting whatsoever since then.  Increased risk of bleeding with Eliquis but even so we have explained that vaginal bleeding is not common at her age and if recurs needs further gyn evaluation  #2 history of B12 deficiency.  Due for repeat injection  #3 IBS history controlled with Levsin    Plan:     -We have encouraged her to set up follow-up with her GYN for  further evaluation given episode of vaginal bleeding above.  There was apparently no evidence for stool or GI source and she did have some blood in her urine but not clear this was coming from urinary source either  -We have advised her to continue Eliquis for now but if she has any recurrent heavy vaginal bleeding would hold.  -B12 injection given per nursing  [name] MD [facility]

## 2019-08-01 NOTE — Patient Instructions (Signed)
Set up follow up with Dr Stann Mainland- and especially for any recurrent vaginal bleeding.

## 2019-08-18 ENCOUNTER — Ambulatory Visit (HOSPITAL_COMMUNITY): Payer: Medicare Other | Attending: Cardiovascular Disease

## 2019-08-18 ENCOUNTER — Other Ambulatory Visit: Payer: Self-pay

## 2019-08-18 DIAGNOSIS — I480 Paroxysmal atrial fibrillation: Secondary | ICD-10-CM | POA: Diagnosis not present

## 2019-08-18 DIAGNOSIS — I361 Nonrheumatic tricuspid (valve) insufficiency: Secondary | ICD-10-CM | POA: Insufficient documentation

## 2019-08-18 DIAGNOSIS — I5032 Chronic diastolic (congestive) heart failure: Secondary | ICD-10-CM | POA: Diagnosis not present

## 2019-08-22 ENCOUNTER — Ambulatory Visit: Payer: Medicare Other | Admitting: Cardiovascular Disease

## 2019-08-22 ENCOUNTER — Other Ambulatory Visit: Payer: Self-pay

## 2019-08-22 ENCOUNTER — Encounter: Payer: Self-pay | Admitting: Cardiovascular Disease

## 2019-08-22 VITALS — BP 148/82 | HR 72 | Ht 63.5 in | Wt 100.0 lb

## 2019-08-22 DIAGNOSIS — E782 Mixed hyperlipidemia: Secondary | ICD-10-CM | POA: Diagnosis not present

## 2019-08-22 DIAGNOSIS — I340 Nonrheumatic mitral (valve) insufficiency: Secondary | ICD-10-CM

## 2019-08-22 DIAGNOSIS — I2510 Atherosclerotic heart disease of native coronary artery without angina pectoris: Secondary | ICD-10-CM | POA: Diagnosis not present

## 2019-08-22 DIAGNOSIS — I10 Essential (primary) hypertension: Secondary | ICD-10-CM | POA: Diagnosis not present

## 2019-08-22 MED ORDER — CARVEDILOL 6.25 MG PO TABS: 6.2500 mg | ORAL_TABLET | Freq: Two times a day (BID) | ORAL | 3 refills | Status: DC

## 2019-08-22 NOTE — Patient Instructions (Addendum)
Medication Instructions:  1) INCREASE CARVEDILOL to 6.25 mg twice daily  *If you need a refill on your cardiac medications before your next appointment, please call your pharmacy*  Follow-Up: You will be called to scheduled your 6 month echo and office visit.

## 2019-08-22 NOTE — Addendum Note (Signed)
Addended by: Harland German A on: 08/22/2019 02:27 PM   Modules accepted: Orders

## 2019-08-22 NOTE — Progress Notes (Signed)
Cardiology Office Note:    Date:  08/22/2019   ID:  [name], DOB 1933-04-19, MRN [id number]  PCP:  [name], MD  Cardiologist:  Sherren Mocha, MD  Electrophysiologist:  None   Referring MD: [name], MD   Chief Complaint  Patient presents with  . Shortness of Breath    History of Present Illness:    [name] is a 86 y.o. female with a hx of coronary artery disease status post CABG in 2007.  The patient also has chronic anemia, hypertension, moderate mitral regurgitation, and asymptomatic carotid stenosis.  She was diagnosed with atrial fibrillation in 2019 during a GI illness, but spontaneously was noted to be back in sinus rhythm at follow-up.  The patient is here alone today.  She had a recent echocardiogram and presents for clinical follow-up as well as review of her echo study.  She has had some problems with vaginal bleeding and is now followed by Dr Stann Mainland with Gynecology.  The patient has discontinued low-dose aspirin.  She remains on dose adjusted apixaban for anticoagulation.  Symptoms have improved.  She does have exertional dyspnea with moderate activity.  She is most limited by low back problems and it sounds like she may have a compression fracture of her lumbar vertebrae.  She does not have orthopnea, PND, or leg swelling.  She has had no chest pain or pressure.  Past Medical History:  Diagnosis Date  . Allergy   . Anemia, pernicious   . Arthritis    bursitis   . Atrophic gastritis   . B12 deficiency   . CAD (coronary artery disease)   . Carotid artery stenosis   . Cataract   . Diarrhea    on and off for months  . Family history of adverse reaction to anesthesia    extreme nausea brother and sister  . Fibromyalgia   . Gallbladder polyp   . GERD (gastroesophageal reflux disease)   . Heart murmur   . History of hiatal hernia   . History of kidney stones   . HLD (hyperlipidemia)   . HTN (hypertension)   . Hypothyroidism   .  Irritable bowel syndrome (IBS)   . Median arcuate ligament syndrome ([place])   . Osteoporosis   . PVD (peripheral vascular disease) ([place])   . Tubular adenoma of colon 08/2013   with focal high grade dysplasia    Past Surgical History:  Procedure Laterality Date  . ABDOMINAL HYSTERECTOMY    . APPENDECTOMY    . BIOPSY THYROID    . cataract surgery    . CHOLECYSTECTOMY N/A 10/24/2014   Procedure: MICROSCOPIC  CHOLECYSTECTOMY;  Surgeon: Ralene Ok, MD;  Location: [facility];  Service: General;  Laterality: N/A;  . COLONOSCOPY    . CORONARY ARTERY BYPASS GRAFT  07  . ESOPHAGEAL MANOMETRY N/A 08/02/2012   Procedure: ESOPHAGEAL MANOMETRY (EM);  Surgeon: Sable Feil, MD;  Location: [facility];  Service: Endoscopy;  Laterality: N/A;  . ESOPHAGOGASTRODUODENOSCOPY (EGD) WITH PROPOFOL N/A 04/07/2019   Procedure: ESOPHAGOGASTRODUODENOSCOPY (EGD) WITH PROPOFOL;  Surgeon: Milus Banister, MD;  Location: [facility];  Service: Endoscopy;  Laterality: N/A;  . HEMOSTASIS CLIP PLACEMENT  04/07/2019   Procedure: HEMOSTASIS CLIP PLACEMENT;  Surgeon: Milus Banister, MD;  Location: [facility];  Service: Endoscopy;;  . HOT HEMOSTASIS N/A 04/07/2019   Procedure: HOT HEMOSTASIS (ARGON PLASMA COAGULATION/BICAP);  Surgeon: Milus Banister, MD;  Location: [facility];  Service: Endoscopy;  Laterality: N/A;  .  POLYPECTOMY  04/07/2019   Procedure: POLYPECTOMY;  Surgeon: Milus Banister, MD;  Location: [facility];  Service: Endoscopy;;  . [name]  04/07/2019   Procedure: [name];  Surgeon: Milus Banister, MD;  Location: [facility];  Service: Endoscopy;;  . THYROID LOBECTOMY    . UPPER GASTROINTESTINAL ENDOSCOPY      Current Medications: Current Meds  Medication Sig  . acetaminophen (TYLENOL) 500 MG tablet Take 500 mg by mouth every 6 (six) hours as needed (pain).  Marland Kitchen atorvastatin (LIPITOR) 80 MG tablet TAKE 1 TABLET BY MOUTH DAILY AT BEDTIME  . Calcium Carbonate-Vitamin D (CALTRATE  600+D PO) Take 1 tablet by mouth daily with lunch.   . carvedilol (COREG) 6.25 MG tablet Take 1 tablet (6.25 mg total) by mouth 2 (two) times daily.  . cephALEXin (KEFLEX) 500 MG capsule Take 1 capsule (500 mg total) by mouth 3 (three) times daily.  . cyanocobalamin (,VITAMIN B-12,) 1000 MCG/ML injection Inject 1,000 mcg into the muscle every 30 (thirty) days.  Marland Kitchen ELIQUIS 2.5 MG TABS tablet TAKE 1 TABLET BY MOUTH TWICE DAILY  . Ferrous Sulfate (IRON) 28 MG TABS Take 1 tablet by mouth daily.  . fluticasone (FLONASE) 50 MCG/ACT nasal spray Place 1 spray daily as needed into both nostrils for allergies or rhinitis.  Marland Kitchen HM LIDOCAINE PATCH EX Place 1 patch onto the skin daily as needed (pain.).  Marland Kitchen hyoscyamine (LEVSIN SL) 0.125 MG SL tablet Place 1 tablet (0.125 mg total) under the tongue every 6 (six) hours as needed.  Marland Kitchen ipratropium (ATROVENT) 0.03 % nasal spray   . levothyroxine (SYNTHROID, LEVOTHROID) 50 MCG tablet Take 50 mcg by mouth every evening.   Marland Kitchen LIDOCAINE EX Apply 1 application topically 3 (three) times daily as needed (pain). Roll-On (otc)  . losartan (COZAAR) 100 MG tablet TAKE 1 TABLET BY MOUTH DAILY  . Multiple Vitamins-Minerals (PRESERVISION AREDS 2 PO) Take 1 tablet by mouth 2 (two) times daily.  Vladimir Faster Glycol-Propyl Glycol (SYSTANE FREE OP) Place 1 drop into both eyes 3 (three) times daily as needed (dry/irritated eyes.).  Marland Kitchen sodium chloride (OCEAN) 0.65 % nasal spray Place 1 spray into both nostrils 3 (three) times daily as needed for congestion.   Marland Kitchen spironolactone (ALDACTONE) 25 MG tablet TAKE 1 TABLET BY MOUTH ONCE DAILY (Patient taking differently: Take 25 mg by mouth daily. )  . sucralfate (CARAFATE) 1 GM/10ML suspension TAKE 10 MLS (1 G TOTAL) BY MOUTH 2 (TWO) TIMES DAILY. (Patient taking differently: Take 1 g by mouth 2 (two) times daily as needed (nausea/pain.). )  . traMADol (ULTRAM) 50 MG tablet Take 50 mg by mouth as needed for pain.  . [DISCONTINUED] carvedilol (COREG)  6.25 MG tablet TAKE 1/2 TABLET BY MOUTH TWICE DAILY     Allergies:   Actos [pioglitazone], Sulfonamide derivatives, Adhesive [tape], and Codeine   Social History   Socioeconomic History  . Marital status: Married    Spouse name: Not on file  . Number of children: Not on file  . Years of education: 13  . Highest education level: Not on file  Occupational History  . Occupation: retired    Fish farm manager: RETIRED  Tobacco Use  . Smoking status: Never Smoker  . Smokeless tobacco: Never Used  Substance and Sexual Activity  . Alcohol use: No    Alcohol/week: 0.0 standard drinks  . Drug use: No  . Sexual activity: Not on file  Other Topics Concern  . Not on file  Social History Narrative  [name], Married. Lives with husband. Had been a very active woman.   Social Determinants of Health   Financial Resource Strain:   . Difficulty of Paying Living Expenses:   Food Insecurity:   . Worried About Charity fundraiser in the Last Year:   . Arboriculturist in the Last Year:   Transportation Needs:   . Film/video editor (Medical):   Marland Kitchen Lack of Transportation (Non-Medical):   Physical Activity:   . Days of Exercise per Week:   . Minutes of Exercise per Session:   Stress:   . Feeling of Stress :   Social Connections:   . Frequency of Communication with Friends and Family:   . Frequency of Social Gatherings with Friends and Family:   . Attends Religious Services:   . Active Member of Clubs or Organizations:   . Attends Archivist Meetings:   Marland Kitchen Marital Status:      Family History: The patient's family history includes Breast cancer in an other family member; Cholelithiasis in her mother; Colon cancer in her brother; Diabetes in her sister; Heart disease in her mother; Lung cancer in her father and sister; Prostate cancer in her brother; Stomach cancer in her cousin. There is no history of Esophageal cancer or Rectal cancer.  ROS:   Please see the history of present illness.     All other systems reviewed and are negative.  EKGs/Labs/Other Studies Reviewed:    The following studies were reviewed today: Echo 08/18/2019: IMPRESSIONS    1. Global hypokinesis with abnormal septal motion EF down compared to  echo done 04/12/18 . Left ventricular ejection fraction, by estimation, is  45 to 50%. The left ventricle has mildly decreased function. The left  ventricle demonstrates global  hypokinesis. The left ventricular internal cavity size was mildly dilated.  Left ventricular diastolic parameters are indeterminate.  2. Right ventricular systolic function is normal. The right ventricular  size is normal. There is moderately elevated pulmonary artery systolic  pressure.  3. Left atrial size was mildly dilated.  4. Right atrial size was moderately dilated.  5. Eccentric anteriorly directed jet with restricted posterior leaflet  motion still appears moderate compared to echo done 04/11/2018 Can  consider TEE if symptomatic to further evaluate for clip . The mitral  valve is normal in structure. Moderate mitral  valve regurgitation. No evidence of mitral stenosis.  6. The aortic valve is tricuspid. Aortic valve regurgitation is mild to  moderate. Mild to moderate aortic valve sclerosis/calcification is  present, without any evidence of aortic stenosis.  7. The inferior vena cava is normal in size with greater than 50%  respiratory variability, suggesting right atrial pressure of 3 mmHg.   FINDINGS  Left Ventricle: Global hypokinesis with abnormal septal motion EF down  compared to echo done 04/12/18. Left ventricular ejection fraction, by  estimation, is 45 to 50%. The left ventricle has mildly decreased  function. The left ventricle demonstrates  global hypokinesis. The left ventricular internal cavity size was mildly  dilated. There is no left ventricular hypertrophy. Left ventricular  diastolic parameters are indeterminate.   Right Ventricle: The  right ventricular size is normal. No increase in  right ventricular wall thickness. Right ventricular systolic function is  normal. There is moderately elevated pulmonary artery systolic pressure.  The tricuspid regurgitant velocity is  3.16 m/s, and with an assumed right atrial pressure of 3 mmHg, the  estimated right ventricular systolic pressure is 0000000 mmHg.  Left Atrium: Left atrial size was mildly dilated.   Right Atrium: Right atrial size was moderately dilated.   Pericardium: There is no evidence of pericardial effusion.   Mitral Valve: Eccentric anteriorly directed jet with restricted posterior  leaflet motion still appears moderate compared to echo done 04/11/2018 Can  consider TEE if symptomatic to further evaluate for clip. The mitral valve  is normal in structure. There  is mild thickening of the mitral valve leaflet(s). There is mild  calcification of the mitral valve leaflet(s). Normal mobility of the  mitral valve leaflets. Mild mitral annular calcification. Moderate mitral  valve regurgitation. No evidence of mitral  valve stenosis. MV peak gradient, 10.5 mmHg. The mean mitral valve  gradient is 2.0 mmHg.   Tricuspid Valve: The tricuspid valve is normal in structure. Tricuspid  valve regurgitation is mild . No evidence of tricuspid stenosis.   Aortic Valve: The aortic valve is tricuspid. Aortic valve regurgitation is  mild to moderate. Aortic regurgitation PHT measures 749 msec. Mild to  moderate aortic valve sclerosis/calcification is present, without any  evidence of aortic stenosis.   Pulmonic Valve: The pulmonic valve was normal in structure. Pulmonic valve  regurgitation is not visualized. No evidence of pulmonic stenosis.   Aorta: The aortic root is normal in size and structure.   Venous: The inferior vena cava is normal in size with greater than 50%  respiratory variability, suggesting right atrial pressure of 3 mmHg.   IAS/Shunts: The interatrial  septum was not well visualized.   EKG:  EKG is not ordered today.    Recent Labs: 07/25/2019: ALT 30; BUN 19; Creatinine, Ser 0.97; Hemoglobin 13.2; Platelets 150; Potassium 3.7; Sodium 143  Recent Lipid Panel    Component Value Date/Time   CHOL 135 11/18/2016 0000   TRIG 54 11/18/2016 0000   TRIG 85 02/05/2006 0818   HDL 69.20 11/22/2014 0919   CHOLHDL 2 11/22/2014 0919   VLDL 10.6 11/22/2014 0919   LDLCALC 66 11/22/2014 0919    Physical Exam:    VS:  BP (!) 148/82   Pulse 72   Ht 5' 3.5" (1.613 m)   Wt 100 lb (45.4 kg)   SpO2 93%   BMI 17.44 kg/m     Wt Readings from Last 3 Encounters:  08/22/19 100 lb (45.4 kg)  08/01/19 99 lb 1.6 oz (45 kg)  07/25/19 99 lb (44.9 kg)     GEN:  Thin elderly woman in no acute distress HEENT: Normal NECK: No JVD; No carotid bruits LYMPHATICS: No lymphadenopathy CARDIAC: RRR, 3/6 holosystolic murmur at the apex RESPIRATORY:  Clear to auscultation without rales, wheezing or rhonchi  ABDOMEN: Soft, non-tender, non-distended MUSCULOSKELETAL:  No edema; No deformity  SKIN: Warm and dry NEUROLOGIC:  Alert and oriented x 3 PSYCHIATRIC:  Normal affect   ASSESSMENT:    1. Coronary artery disease involving native coronary artery of native heart without angina pectoris   2. Essential hypertension   3. Mixed hyperlipidemia   4. Nonrheumatic mitral valve regurgitation    PLAN:    In order of problems listed above:  1. Appears stable without angina.  Agree with no antiplatelet therapy in the setting of chronic oral anticoagulation.  Continue current medications which include beta-blocker, high intensity statin drug, and apixaban. 2. Blood pressure is mildly elevated today.  I am going to increase carvedilol to 6.25 mg twice daily.  Otherwise she should continue on spironolactone and losartan at current doses. 3. Continue same therapy.  Most  recent lipids reviewed.  ALT recently drawn at 30 mg/dL in normal range. 4. I reviewed her echo  images today.  I think she has 3+ mitral regurgitation.  She also has mild to moderate aortic insufficiency and tricuspid insufficiency.  Her LV function is mildly depressed.  I think we should continue with close clinical follow-up considering her frailty and other comorbidities.  I am going to repeat an echocardiogram and see her back in 6 months.  I have asked her to increase carvedilol to 6.25 mg twice daily.   Medication Adjustments/Labs and Tests Ordered: Current medicines are reviewed at length with the patient today.  Concerns regarding medicines are outlined above.  No orders of the defined types were placed in this encounter.  Meds ordered this encounter  Medications  . carvedilol (COREG) 6.25 MG tablet    Sig: Take 1 tablet (6.25 mg total) by mouth 2 (two) times daily.    Dispense:  180 tablet    Refill:  3    Patient Instructions  Medication Instructions:  1) INCREASE CARVEDILOL to 6.25 mg twice daily  *If you need a refill on your cardiac medications before your next appointment, please call your pharmacy*  Follow-Up: You will be called to scheduled your 6 month echo and office visit.    Signed, Sherren Mocha, MD  08/22/2019 2:10 PM    [place]

## 2019-08-23 ENCOUNTER — Ambulatory Visit (INDEPENDENT_AMBULATORY_CARE_PROVIDER_SITE_OTHER): Payer: Medicare Other | Admitting: Family Medicine

## 2019-08-23 ENCOUNTER — Encounter: Payer: Self-pay | Admitting: Family Medicine

## 2019-08-23 VITALS — BP 130/78 | HR 90 | Temp 99.0°F | Wt 99.6 lb

## 2019-08-23 DIAGNOSIS — I10 Essential (primary) hypertension: Secondary | ICD-10-CM

## 2019-08-23 DIAGNOSIS — Z862 Personal history of diseases of the blood and blood-forming organs and certain disorders involving the immune mechanism: Secondary | ICD-10-CM

## 2019-08-23 MED ORDER — SUCRALFATE 1 GM/10ML PO SUSP: ORAL | 1 refills | Status: DC

## 2019-08-23 NOTE — Progress Notes (Signed)
Subjective:     Patient ID: [name], female   DOB: 02-Sep-1932, 86 y.o.   MRN: [id number]  HPI [name] is here from visit which was actually scheduled a few months ago.  She had presented with anemia following bleeding from likely a gastric polyp.  Her hemoglobin had gone down to 10 range.  She recently had some vaginal spotting and saw GYN.  There was no visible source found.  She likely had small area of irritation and skin split from atrophy.  She was given some sort of topical presumably topical estrogen and symptoms are stable at this time with no vaginal spotting.  She has not had any melena.  No nausea or vomiting.  She states she feels better than she has in some time.  She has had previous hysterectomy   Saw cardiology yesterday.  She had repeat echo with ejection fraction 45 to 50%.  Mitral regurgitation.  Blood pressure was up slightly and her carvedilol was increased to 6.25 mg twice daily.  Past Medical History:  Diagnosis Date  . Allergy   . Anemia, pernicious   . Arthritis    bursitis   . Atrophic gastritis   . B12 deficiency   . CAD (coronary artery disease)   . Carotid artery stenosis   . Cataract   . Diarrhea    on and off for months  . Family history of adverse reaction to anesthesia    extreme nausea brother and sister  . Fibromyalgia   . Gallbladder polyp   . GERD (gastroesophageal reflux disease)   . Heart murmur   . History of hiatal hernia   . History of kidney stones   . HLD (hyperlipidemia)   . HTN (hypertension)   . Hypothyroidism   . Irritable bowel syndrome (IBS)   . Median arcuate ligament syndrome ([place])   . Osteoporosis   . PVD (peripheral vascular disease) ([place])   . Tubular adenoma of colon 08/2013   with focal high grade dysplasia   Past Surgical History:  Procedure Laterality Date  . ABDOMINAL HYSTERECTOMY    . APPENDECTOMY    . BIOPSY THYROID    . cataract surgery    . CHOLECYSTECTOMY N/A 10/24/2014   Procedure: MICROSCOPIC   CHOLECYSTECTOMY;  Surgeon: Ralene Ok, MD;  Location: [facility];  Service: General;  Laterality: N/A;  . COLONOSCOPY    . CORONARY ARTERY BYPASS GRAFT  07  . ESOPHAGEAL MANOMETRY N/A 08/02/2012   Procedure: ESOPHAGEAL MANOMETRY (EM);  Surgeon: Sable Feil, MD;  Location: [facility];  Service: Endoscopy;  Laterality: N/A;  . ESOPHAGOGASTRODUODENOSCOPY (EGD) WITH PROPOFOL N/A 04/07/2019   Procedure: ESOPHAGOGASTRODUODENOSCOPY (EGD) WITH PROPOFOL;  Surgeon: Milus Banister, MD;  Location: [facility];  Service: Endoscopy;  Laterality: N/A;  . HEMOSTASIS CLIP PLACEMENT  04/07/2019   Procedure: HEMOSTASIS CLIP PLACEMENT;  Surgeon: Milus Banister, MD;  Location: [facility];  Service: Endoscopy;;  . HOT HEMOSTASIS N/A 04/07/2019   Procedure: HOT HEMOSTASIS (ARGON PLASMA COAGULATION/BICAP);  Surgeon: Milus Banister, MD;  Location: [facility];  Service: Endoscopy;  Laterality: N/A;  . POLYPECTOMY  04/07/2019   Procedure: POLYPECTOMY;  Surgeon: Milus Banister, MD;  Location: [facility];  Service: Endoscopy;;  . [name]  04/07/2019   Procedure: [name];  Surgeon: Milus Banister, MD;  Location: [facility];  Service: Endoscopy;;  . THYROID LOBECTOMY    . UPPER GASTROINTESTINAL ENDOSCOPY      reports that she has never smoked. She has never  used smokeless tobacco. She reports that she does not drink alcohol or use drugs. family history includes Breast cancer in an other family member; Cholelithiasis in her mother; Colon cancer in her brother; Diabetes in her sister; Heart disease in her mother; Lung cancer in her father and sister; Prostate cancer in her brother; Stomach cancer in her cousin. Allergies  Allergen Reactions  . Actos [Pioglitazone] Swelling    Swelling  . Sulfonamide Derivatives Swelling  . Adhesive [Tape] Other (See Comments)    WITH PROLONGED EXPOSURE (REDNESS/ITCHING)  . Codeine Nausea And Vomiting    Review of Systems  Constitutional: Negative  for fatigue.  Eyes: Negative for visual disturbance.  Respiratory: Negative for cough, chest tightness, shortness of breath and wheezing.   Cardiovascular: Negative for chest pain, palpitations and leg swelling.  Neurological: Negative for dizziness, seizures, syncope, weakness, light-headedness and headaches.       Objective:   Physical Exam Constitutional:      Appearance: She is well-developed.  Eyes:     Pupils: Pupils are equal, round, and reactive to light.  Neck:     Thyroid: No thyromegaly.     Vascular: No JVD.  Cardiovascular:     Rate and Rhythm: Normal rate.     Heart sounds: No gallop.   Pulmonary:     Effort: Pulmonary effort is normal. No respiratory distress.     Breath sounds: Normal breath sounds. No wheezing or rales.  Musculoskeletal:     Cervical back: Neck supple.  Neurological:     Mental Status: She is alert.        Assessment:     #1 history of recent anemia few months ago likely related to upper GI bleed from gastric polyp bleed.  This was evaluated by GI.  She has had subsequent hemoglobin since then that have been back in the normal range.  No evidence for recent recurrent bleeding we recommended observation  #2 hypertension improved    Plan:     -Recommend routine follow-up in 6 months and sooner as needed -No indication to recheck CBC today with recent hemoglobin normal  [name] MD [facility]

## 2019-08-23 NOTE — Patient Instructions (Signed)
Healthy Eating Following a healthy eating pattern may help you to achieve and maintain a healthy body weight, reduce the risk of chronic disease, and live a long and productive life. It is important to follow a healthy eating pattern at an appropriate calorie level for your body. Your nutritional needs should be met primarily through food by choosing a variety of nutrient-rich foods. What are tips for following this plan? Reading food labels  Read labels and choose the following: ? Reduced or low sodium. ? Juices with 100% fruit juice. ? Foods with low saturated fats and high polyunsaturated and monounsaturated fats. ? Foods with whole grains, such as whole wheat, cracked wheat, brown rice, and wild rice. ? Whole grains that are fortified with folic acid. This is recommended for women who are pregnant or who want to become pregnant.  Read labels and avoid the following: ? Foods with a lot of added sugars. These include foods that contain brown sugar, corn sweetener, corn syrup, dextrose, fructose, glucose, high-fructose corn syrup, honey, invert sugar, lactose, malt syrup, maltose, molasses, raw sugar, sucrose, trehalose, or turbinado sugar.  Do not eat more than the following amounts of added sugar per day:  6 teaspoons (25 g) for women.  9 teaspoons (38 g) for men. ? Foods that contain processed or refined starches and grains. ? Refined grain products, such as white flour, degermed cornmeal, white bread, and white rice. Shopping  Choose nutrient-rich snacks, such as vegetables, whole fruits, and nuts. Avoid high-calorie and high-sugar snacks, such as potato chips, fruit snacks, and candy.  Use oil-based dressings and spreads on foods instead of solid fats such as butter, stick margarine, or cream cheese.  Limit pre-made sauces, mixes, and "instant" products such as flavored rice, instant noodles, and ready-made pasta.  Try more plant-protein sources, such as tofu, tempeh, black beans,  edamame, lentils, nuts, and seeds.  Explore eating plans such as the Mediterranean diet or vegetarian diet. Cooking  Use oil to saut or stir-fry foods instead of solid fats such as butter, stick margarine, or lard.  Try baking, boiling, grilling, or broiling instead of frying.  Remove the fatty part of meats before cooking.  Steam vegetables in water or broth. Meal planning   At meals, imagine dividing your plate into fourths: ? One-half of your plate is fruits and vegetables. ? One-fourth of your plate is whole grains. ? One-fourth of your plate is protein, especially lean meats, poultry, eggs, tofu, beans, or nuts.  Include low-fat dairy as part of your daily diet. Lifestyle  Choose healthy options in all settings, including home, work, school, restaurants, or stores.  Prepare your food safely: ? Wash your hands after handling raw meats. ? Keep food preparation surfaces clean by regularly washing with hot, soapy water. ? Keep raw meats separate from ready-to-eat foods, such as fruits and vegetables. ? Cook seafood, meat, poultry, and eggs to the recommended internal temperature. ? Store foods at safe temperatures. In general:  Keep cold foods at 59F (4.4C) or below.  Keep hot foods at 159F (60C) or above.  Keep your freezer at [facility] (-17.8C) or below.  Foods are no longer safe to eat when they have been between the temperatures of 40-159F (4.4-60C) for more than 2 hours. What foods should I eat? Fruits Aim to eat 2 cup-equivalents of fresh, canned (in natural juice), or frozen fruits each day. Examples of 1 cup-equivalent of fruit include 1 small apple, 8 large strawberries, 1 cup canned fruit,  cup  dried fruit, or 1 cup 100% juice. Vegetables Aim to eat 2-3 cup-equivalents of fresh and frozen vegetables each day, including different varieties and colors. Examples of 1 cup-equivalent of vegetables include 2 medium carrots, 2 cups raw, leafy greens, 1 cup chopped  vegetable (raw or cooked), or 1 medium baked potato. Grains Aim to eat 6 ounce-equivalents of whole grains each day. Examples of 1 ounce-equivalent of grains include 1 slice of bread, 1 cup ready-to-eat cereal, 3 cups popcorn, or  cup cooked rice, pasta, or cereal. Meats and other proteins Aim to eat 5-6 ounce-equivalents of protein each day. Examples of 1 ounce-equivalent of protein include 1 egg, 1/2 cup nuts or seeds, or 1 tablespoon (16 g) peanut butter. A cut of meat or fish that is the size of a deck of cards is about 3-4 ounce-equivalents.  Of the protein you eat each week, try to have at least 8 ounces come from seafood. This includes salmon, trout, herring, and anchovies. Dairy Aim to eat 3 cup-equivalents of fat-free or low-fat dairy each day. Examples of 1 cup-equivalent of dairy include 1 cup (240 mL) milk, 8 ounces (250 g) yogurt, 1 ounces (44 g) natural cheese, or 1 cup (240 mL) fortified soy milk. Fats and oils  Aim for about 5 teaspoons (21 g) per day. Choose monounsaturated fats, such as canola and olive oils, avocados, peanut butter, and most nuts, or polyunsaturated fats, such as sunflower, corn, and soybean oils, walnuts, pine nuts, sesame seeds, sunflower seeds, and flaxseed. Beverages  Aim for six 8-oz glasses of water per day. Limit coffee to three to five 8-oz cups per day.  Limit caffeinated beverages that have added calories, such as soda and energy drinks.  Limit alcohol intake to no more than 1 drink a day for nonpregnant women and 2 drinks a day for men. One drink equals 12 oz of beer (355 mL), 5 oz of wine (148 mL), or 1 oz of hard liquor (44 mL). Seasoning and other foods  Avoid adding excess amounts of salt to your foods. Try flavoring foods with herbs and spices instead of salt.  Avoid adding sugar to foods.  Try using oil-based dressings, sauces, and spreads instead of solid fats. This information is based on general [place] nutrition guidelines. For more  information, visit [employer]. Exact amounts may vary based on your nutrition needs. Summary  A healthy eating plan may help you to maintain a healthy weight, reduce the risk of chronic diseases, and stay active throughout your life.  Plan your meals. Make sure you eat the right portions of a variety of nutrient-rich foods.  Try baking, boiling, grilling, or broiling instead of frying.  Choose healthy options in all settings, including home, work, school, restaurants, or stores. This information is not intended to replace advice given to you by your health care provider. Make sure you discuss any questions you have with your health care provider. Document Revised: 07/27/2017 Document Reviewed: 07/27/2017 Elsevier Patient Education  [place].

## 2019-09-22 ENCOUNTER — Telehealth: Payer: Medicare Other | Admitting: Family Medicine

## 2019-09-22 ENCOUNTER — Other Ambulatory Visit: Payer: Self-pay

## 2019-09-22 ENCOUNTER — Telehealth: Payer: Self-pay | Admitting: *Deleted

## 2019-09-22 ENCOUNTER — Ambulatory Visit (HOSPITAL_COMMUNITY)
Admission: EM | Admit: 2019-09-22 | Discharge: 2019-09-22 | Disposition: A | Discharge status: Home / Self Care | Payer: Medicare Other | Attending: Family Medicine | Admitting: Family Medicine

## 2019-09-22 ENCOUNTER — Encounter (HOSPITAL_COMMUNITY): Payer: Self-pay | Admitting: Emergency Medicine

## 2019-09-22 DIAGNOSIS — E039 Hypothyroidism, unspecified: Secondary | ICD-10-CM | POA: Insufficient documentation

## 2019-09-22 DIAGNOSIS — M797 Fibromyalgia: Secondary | ICD-10-CM | POA: Diagnosis not present

## 2019-09-22 DIAGNOSIS — Z79899 Other long term (current) drug therapy: Secondary | ICD-10-CM | POA: Diagnosis not present

## 2019-09-22 DIAGNOSIS — K589 Irritable bowel syndrome without diarrhea: Secondary | ICD-10-CM | POA: Diagnosis not present

## 2019-09-22 DIAGNOSIS — E785 Hyperlipidemia, unspecified: Secondary | ICD-10-CM | POA: Insufficient documentation

## 2019-09-22 DIAGNOSIS — D509 Iron deficiency anemia, unspecified: Secondary | ICD-10-CM | POA: Diagnosis not present

## 2019-09-22 DIAGNOSIS — Z8719 Personal history of other diseases of the digestive system: Secondary | ICD-10-CM | POA: Diagnosis not present

## 2019-09-22 DIAGNOSIS — E538 Deficiency of other specified B group vitamins: Secondary | ICD-10-CM | POA: Diagnosis not present

## 2019-09-22 DIAGNOSIS — Z951 Presence of aortocoronary bypass graft: Secondary | ICD-10-CM | POA: Diagnosis not present

## 2019-09-22 DIAGNOSIS — Z950 Presence of cardiac pacemaker: Secondary | ICD-10-CM | POA: Diagnosis not present

## 2019-09-22 DIAGNOSIS — Z7901 Long term (current) use of anticoagulants: Secondary | ICD-10-CM | POA: Diagnosis not present

## 2019-09-22 DIAGNOSIS — R197 Diarrhea, unspecified: Secondary | ICD-10-CM | POA: Insufficient documentation

## 2019-09-22 DIAGNOSIS — K219 Gastro-esophageal reflux disease without esophagitis: Secondary | ICD-10-CM | POA: Insufficient documentation

## 2019-09-22 DIAGNOSIS — I2510 Atherosclerotic heart disease of native coronary artery without angina pectoris: Secondary | ICD-10-CM | POA: Insufficient documentation

## 2019-09-22 DIAGNOSIS — Z87442 Personal history of urinary calculi: Secondary | ICD-10-CM | POA: Diagnosis not present

## 2019-09-22 DIAGNOSIS — Z20822 Contact with and (suspected) exposure to covid-19: Secondary | ICD-10-CM | POA: Diagnosis not present

## 2019-09-22 DIAGNOSIS — I739 Peripheral vascular disease, unspecified: Secondary | ICD-10-CM | POA: Insufficient documentation

## 2019-09-22 DIAGNOSIS — R531 Weakness: Secondary | ICD-10-CM | POA: Diagnosis not present

## 2019-09-22 DIAGNOSIS — Z8249 Family history of ischemic heart disease and other diseases of the circulatory system: Secondary | ICD-10-CM | POA: Insufficient documentation

## 2019-09-22 DIAGNOSIS — Z882 Allergy status to sulfonamides status: Secondary | ICD-10-CM | POA: Insufficient documentation

## 2019-09-22 DIAGNOSIS — Z91048 Other nonmedicinal substance allergy status: Secondary | ICD-10-CM | POA: Insufficient documentation

## 2019-09-22 DIAGNOSIS — I4891 Unspecified atrial fibrillation: Secondary | ICD-10-CM | POA: Diagnosis not present

## 2019-09-22 DIAGNOSIS — Z885 Allergy status to narcotic agent status: Secondary | ICD-10-CM | POA: Insufficient documentation

## 2019-09-22 DIAGNOSIS — Z888 Allergy status to other drugs, medicaments and biological substances status: Secondary | ICD-10-CM | POA: Insufficient documentation

## 2019-09-22 DIAGNOSIS — Z86010 Personal history of colonic polyps: Secondary | ICD-10-CM | POA: Insufficient documentation

## 2019-09-22 DIAGNOSIS — I10 Essential (primary) hypertension: Secondary | ICD-10-CM | POA: Diagnosis not present

## 2019-09-22 LAB — COMPREHENSIVE METABOLIC PANEL
ALT: 27 U/L (ref 0–44)
AST: 34 U/L (ref 15–41)
Albumin: 3.7 g/dL (ref 3.5–5.0)
Alkaline Phosphatase: 47 U/L (ref 38–126)
Anion gap: 10 (ref 5–15)
BUN: 21 mg/dL (ref 8–23)
CO2: 26 mmol/L (ref 22–32)
Calcium: 8.8 mg/dL — ABNORMAL LOW (ref 8.9–10.3)
Chloride: 104 mmol/L (ref 98–111)
Creatinine, Ser: 1.04 mg/dL — ABNORMAL HIGH (ref 0.44–1.00)
GFR calc Af Amer: 57 mL/min — ABNORMAL LOW (ref >60)
GFR calc non Af Amer: 49 mL/min — ABNORMAL LOW (ref >60)
Glucose, Bld: 107 mg/dL — ABNORMAL HIGH (ref 70–99)
Potassium: 3.7 mmol/L (ref 3.5–5.1)
Sodium: 140 mmol/L (ref 135–145)
Total Bilirubin: 0.9 mg/dL (ref 0.3–1.2)
Total Protein: 7.2 g/dL (ref 6.5–8.1)

## 2019-09-22 LAB — CBC WITH DIFFERENTIAL/PLATELET
Abs Immature Granulocytes: 0.01 10*3/uL (ref 0.00–0.07)
Basophils Absolute: 0 10*3/uL (ref 0.0–0.1)
Basophils Relative: 0 %
Eosinophils Absolute: 0 10*3/uL (ref 0.0–0.5)
Eosinophils Relative: 0 %
HCT: 43.4 % (ref 36.0–46.0)
Hemoglobin: 14 g/dL (ref 12.0–15.0)
Immature Granulocytes: 0 %
Lymphocytes Relative: 11 %
Lymphs Abs: 0.6 10*3/uL — ABNORMAL LOW (ref 0.7–4.0)
MCH: 27.8 pg (ref 26.0–34.0)
MCHC: 32.3 g/dL (ref 30.0–36.0)
MCV: 86.1 fL (ref 80.0–100.0)
Monocytes Absolute: 0.6 10*3/uL (ref 0.1–1.0)
Monocytes Relative: 11 %
Neutro Abs: 4.2 10*3/uL (ref 1.7–7.7)
Neutrophils Relative %: 78 %
Platelets: 132 10*3/uL — ABNORMAL LOW (ref 150–400)
RBC: 5.04 MIL/uL (ref 3.87–5.11)
RDW: 17.3 % — ABNORMAL HIGH (ref 11.5–15.5)
WBC: 5.4 10*3/uL (ref 4.0–10.5)
nRBC: 0 % (ref 0.0–0.2)

## 2019-09-22 MED ORDER — SODIUM CHLORIDE 0.9 % IV SOLN: Freq: Once | INTRAVENOUS | Status: AC

## 2019-09-22 NOTE — Discharge Instructions (Addendum)
We gave you some fluids today and rehydrated you.  I believe that your symptoms are related to dehydration from the diarrhea. Keep trying to drink fluids and stay hydrated at home We have tested you for Covid.  We should have those results tomorrow  I have also done some blood work and I will call you later with the results If your symptoms worsen you will need to go to the ER. Follow up as needed for continued or worsening symptoms

## 2019-09-22 NOTE — ED Triage Notes (Signed)
"  allergy" symptoms started Saturday. One episode of diarrhea Monday.   Yesterday she had a witnessed loss of consciousness for less than 1 minute . She made a virtual visit with her PCP for today, and was told to go to the emergency room. She chose to come to UC.   Today she feels weak, dehydrated, nauseated, and had diarrhea this morning.   Denies chest pain.

## 2019-09-22 NOTE — ED Provider Notes (Signed)
[place]    CSN: [id number] Arrival date & time: 09/22/19  1135      History   Chief Complaint No chief complaint on file.   HPI [name] is a 86 y.o. female.   Patient is a 85 year old female with past medical history of allergy, anemia, arthritis, atrophic gastritis, B12 deficiency, CAD, cataract, diarrhea, fibromyalgia, GERD, A. Fib.  She presents today with weakness, dizziness, diarrhea.  Started on Sunday with allergy type symptoms.  Was working outside and thought she had allergy flareups and took allergy pills.  Some relief with this.  Then on Tuesday she started having severe diarrhea she had this for 2 days.  This is somewhat resolved but yesterday per her husband she was sitting on the couch and passed out for about 1 minute.  She had been feeling better that morning and had done a lot prior to this happening.  Today she feels weak, dehydrated, nauseated and had one episode of diarrhea this morning.  Husband was recently treated for upper respiratory infection.  She has been Covid vaccinated.  Denies any chest pain, shortness of breath.  The dizziness is mostly from going from sitting to standing.  She has been drinking fluids to rehydrate.  Has been eating very small meals but has had mostly loss of appetite.  No blood in stools.  No recent traveling, antibiotic use.  No specific abdominal pain but cramping before diarrhea.  No vomiting or fevers.  ROS per HPI      Past Medical History:  Diagnosis Date   Allergy    Anemia, pernicious    Arthritis    bursitis    Atrophic gastritis    B12 deficiency    CAD (coronary artery disease)    Carotid artery stenosis    Cataract    Diarrhea    on and off for months   Family history of adverse reaction to anesthesia    extreme nausea brother and sister   Fibromyalgia    Gallbladder polyp    GERD (gastroesophageal reflux disease)    Heart murmur    History of hiatal hernia    History of  kidney stones    HLD (hyperlipidemia)    HTN (hypertension)    Hypothyroidism    Irritable bowel syndrome (IBS)    Median arcuate ligament syndrome (HCC)    Osteoporosis    PVD (peripheral vascular disease) ([place])    Tubular adenoma of colon 08/2013   with focal high grade dysplasia    Patient Active Problem List   Diagnosis Date Noted   Gastric polyp    Lymphocytic colitis 09/15/2018   B12 deficiency 06/29/2018   Persistent atrial fibrillation ([place]) 04/05/2018   Loose stools 07/06/2017   Rectal bleeding 07/06/2017   Generalized abdominal pain 07/06/2017   Cervical spondylosis 03/11/2017   Loss of weight 11/18/2013   Nausea alone 11/18/2013   Abdominal pain, epigastric 11/18/2013   Anemia, iron deficiency 01/27/2013   Temporomandibular joint-pain-dysfunction syndrome (TMJ) 06/28/2012   Other abnormal glucose 08/25/2011   Allergic rhinitis, cause unspecified 08/25/2011   Right-sided chest wall pain 08/25/2011   Murmur 09/20/2010   Incisional hernia 09/20/2010   HERNIATED DISC 01/02/2010   CAROTID ARTERY DISEASE 08/27/2009   Osteoporosis 06/07/2009   BRADYCARDIA 02/05/2009   BENIGN POSITIONAL VERTIGO 09/27/2008   CELIAC ARTERY COMPRESSION SYNDROME 10/25/2007   HYPOTHYROIDISM 01/08/2007   Hyperlipidemia 01/08/2007   Essential hypertension 01/08/2007   Coronary atherosclerosis 01/08/2007   PERIPHERAL VASCULAR DISEASE  01/08/2007   ATROPHIC GASTRITIS 01/08/2007   ANEMIA, PERNICIOUS, HX OF 01/08/2007   RENAL CALCULUS, HX OF 01/08/2007   HYSTERECTOMY, HX OF 01/08/2007   APPENDECTOMY, HX OF 01/08/2007    Past Surgical History:  Procedure Laterality Date   ABDOMINAL HYSTERECTOMY     APPENDECTOMY     BIOPSY THYROID     cataract surgery     CHOLECYSTECTOMY N/A 10/24/2014   Procedure: MICROSCOPIC  CHOLECYSTECTOMY;  Surgeon: Ralene Ok, MD;  Location: [facility];  Service: General;  Laterality: N/A;   COLONOSCOPY     CORONARY  ARTERY BYPASS GRAFT  07   ESOPHAGEAL MANOMETRY N/A 08/02/2012   Procedure: ESOPHAGEAL MANOMETRY (EM);  Surgeon: Sable Feil, MD;  Location: [facility];  Service: Endoscopy;  Laterality: N/A;   ESOPHAGOGASTRODUODENOSCOPY (EGD) WITH PROPOFOL N/A 04/07/2019   Procedure: ESOPHAGOGASTRODUODENOSCOPY (EGD) WITH PROPOFOL;  Surgeon: Milus Banister, MD;  Location: [facility];  Service: Endoscopy;  Laterality: N/A;   HEMOSTASIS CLIP PLACEMENT  04/07/2019   Procedure: HEMOSTASIS CLIP PLACEMENT;  Surgeon: Milus Banister, MD;  Location: [facility];  Service: Endoscopy;;   HOT HEMOSTASIS N/A 04/07/2019   Procedure: HOT HEMOSTASIS (ARGON PLASMA COAGULATION/BICAP);  Surgeon: Milus Banister, MD;  Location: [facility];  Service: Endoscopy;  Laterality: N/A;   POLYPECTOMY  04/07/2019   Procedure: POLYPECTOMY;  Surgeon: Milus Banister, MD;  Location: [facility];  Service: Endoscopy;;   SCLEROTHERAPY  04/07/2019   Procedure: [name];  Surgeon: Milus Banister, MD;  Location: [facility];  Service: Endoscopy;;   THYROID LOBECTOMY     UPPER GASTROINTESTINAL ENDOSCOPY      OB History   No obstetric history on file.      Home Medications    Prior to Admission medications   Medication Sig Start Date End Date Taking? Authorizing Provider  acetaminophen (TYLENOL) 500 MG tablet Take 500 mg by mouth every 6 (six) hours as needed (pain).    [provider]  atorvastatin (LIPITOR) 80 MG tablet TAKE 1 TABLET BY MOUTH DAILY AT BEDTIME 07/27/19   Burchette, Alinda Sierras, MD  Calcium Carbonate-Vitamin D (CALTRATE 600+D PO) Take 1 tablet by mouth daily with lunch.     [provider]  carvedilol (COREG) 6.25 MG tablet Take 1 tablet (6.25 mg total) by mouth 2 (two) times daily. 08/22/19 08/16/20  Sherren Mocha, MD  cyanocobalamin (,VITAMIN B-12,) 1000 MCG/ML injection Inject 1,000 mcg into the muscle every 30 (thirty) days.    [provider]  ELIQUIS 2.5 MG TABS  tablet TAKE 1 TABLET BY MOUTH TWICE DAILY 07/06/19   Burchette, Alinda Sierras, MD  Ferrous Sulfate (IRON) 28 MG TABS Take 1 tablet by mouth daily.    [provider]  fluticasone (FLONASE) 50 MCG/ACT nasal spray Place 1 spray daily as needed into both nostrils for allergies or rhinitis.    [provider]  HM LIDOCAINE PATCH EX Place 1 patch onto the skin daily as needed (pain.).    [provider]  hyoscyamine (LEVSIN SL) 0.125 MG SL tablet Place 1 tablet (0.125 mg total) under the tongue every 6 (six) hours as needed. 05/23/19   Burchette, Alinda Sierras, MD  ipratropium (ATROVENT) 0.03 % nasal spray  06/13/19   [provider]  levothyroxine (SYNTHROID, LEVOTHROID) 50 MCG tablet Take 50 mcg by mouth every evening.     [provider]  LIDOCAINE EX Apply 1 application topically 3 (three) times daily as needed (pain). Roll-On (otc)    [provider]  losartan (COZAAR) 100 MG tablet TAKE 1 TABLET BY MOUTH DAILY 07/27/19   Burchette, Alinda Sierras, MD  Multiple Vitamins-Minerals (PRESERVISION AREDS 2 PO) Take 1 tablet by mouth 2 (two) times daily.    [provider]  Polyethyl Glycol-Propyl Glycol (SYSTANE FREE OP) Place 1 drop into both eyes 3 (three) times daily as needed (dry/irritated eyes.).    [provider]  sodium chloride (OCEAN) 0.65 % nasal spray Place 1 spray into both nostrils 3 (three) times daily as needed for congestion.     [provider]  spironolactone (ALDACTONE) 25 MG tablet TAKE 1 TABLET BY MOUTH ONCE DAILY Patient taking differently: Take 25 mg by mouth daily.  01/04/19   Burchette, Alinda Sierras, MD  sucralfate (CARAFATE) 1 GM/10ML suspension TAKE 10 MLS (1 G TOTAL) BY MOUTH 2 (TWO) TIMES DAILY. 08/23/19   Burchette, Alinda Sierras, MD  traMADol (ULTRAM) 50 MG tablet Take 50 mg by mouth as needed for pain.    [provider]  zoledronic acid (RECLAST) 5 MG/100ML SOLN injection Reclast    [provider]    Family  History Family History  Problem Relation Age of Onset   Heart disease Mother    Cholelithiasis Mother    Prostate cancer Brother    Colon cancer Brother    Breast cancer Other        niece - breast cancer   Diabetes Sister    Lung cancer Father    Lung cancer Sister    Stomach cancer Cousin    Esophageal cancer Neg Hx    Rectal cancer Neg Hx     Social History Social History   Tobacco Use   Smoking status: Never Smoker   Smokeless tobacco: Never Used  Substance Use Topics   Alcohol use: No    Alcohol/week: 0.0 standard drinks   Drug use: No     Allergies   Actos [pioglitazone], Sulfonamide derivatives, Adhesive [tape], and Codeine   Review of Systems Review of Systems   Physical Exam Triage Vital Signs ED Triage Vitals  Enc Vitals Group     BP 09/22/19 1300 (!) 148/76     Pulse Rate 09/22/19 1300 85     Resp 09/22/19 1300 16     Temp 09/22/19 1300 98.3 F (36.8 C)     Temp Source 09/22/19 1300 Oral     SpO2 09/22/19 1300 100 %     Weight --      Height --      Head Circumference --      Peak Flow --      Pain Score 09/22/19 1422 3     Pain Loc --      Pain Edu? --      Excl. in [place]? --    No data found.  Updated Vital Signs BP 125/63    Pulse 80    Temp 98.3 F (36.8 C) (Oral)    Resp 16    SpO2 100%   Visual Acuity Right Eye Distance:   Left Eye Distance:   Bilateral Distance:    Right Eye Near:   Left Eye Near:    Bilateral Near:     Physical Exam Vitals and nursing note reviewed.  Constitutional:      General: She is not in acute distress.    Appearance: Normal appearance. She is not ill-appearing, toxic-appearing or diaphoretic.  HENT:     Head: Normocephalic.     Nose: Nose normal.  Mouth/Throat:     Mouth: Mucous membranes are dry.     Pharynx: Oropharynx is clear.  Eyes:     Conjunctiva/sclera: Conjunctivae normal.  Cardiovascular:     Rate and Rhythm: Rhythm irregular.     Pulses: Normal pulses.      Heart sounds: Normal heart sounds.     Comments: A fib  Pulmonary:     Effort: Pulmonary effort is normal.     Breath sounds: Normal breath sounds.  Abdominal:     General: Abdomen is flat.     Palpations: Abdomen is soft.     Tenderness: There is no abdominal tenderness.  Musculoskeletal:        General: Normal range of motion.     Cervical back: Normal range of motion.  Skin:    General: Skin is warm and dry.     Findings: No rash.  Neurological:     General: No focal deficit present.     Mental Status: She is alert.  Psychiatric:        Mood and Affect: Mood normal.      UC Treatments / Results  Labs (all labs ordered are listed, but only abnormal results are displayed) Labs Reviewed  CBC WITH DIFFERENTIAL/PLATELET - Abnormal; Notable for the following components:      Result Value   RDW 17.3 (*)    Platelets 132 (*)    Lymphs Abs 0.6 (*)    All other components within normal limits  COMPREHENSIVE METABOLIC PANEL - Abnormal; Notable for the following components:   Glucose, Bld 107 (*)    Creatinine, Ser 1.04 (*)    Calcium 8.8 (*)    GFR calc non Af Amer 49 (*)    GFR calc Af Amer 57 (*)    All other components within normal limits  SARS CORONAVIRUS 2 (TAT 6-24 HRS)    EKG   Radiology No results found.  Procedures Procedures (including critical care time)  Medications Ordered in UC Medications  0.9 %  sodium chloride infusion ( Intravenous Stopped 09/22/19 1435)    Initial Impression / Assessment and Plan / UC Course  I have reviewed the triage vital signs and the nursing notes.  Pertinent labs & imaging results that were available during my care of the patient were reviewed by me and considered in my medical decision making (see chart for details).     Weakness and diarrhea Believe patient may be mildly dehydrated from all the diarrhea and this caused her to have a syncopal episode yesterday. She was sitting on the couch this happened did not fall  or hit her head. Concern for some sort of viral illness and possible Covid. EKG with A. fib today which is consistent with previous.  She is on Eliquis for this. She appears mildly ill on exam Vital signs are stable and she is nontoxic She is having no acute pain or chest pain or shortness of breath. Rehydrated here with 500 mils of normal saline CBC with mildly low platelets and AKI most likely from dehydration.  The normal saline bolus should improve this.  She is also able to drink at home to rehydrate.  Do not feel that she needs to go to the ER be hospitalized at this point. Recommended if symptoms worsen or do not improve she will need to go to the ER at that point Covid swab pending  Called patient and relayed results of lab work. Recommended continue to rehydrate and if symptoms continue  to worsen to go to the ER Patient understanding and agree to plan.  Final Clinical Impressions(s) / UC Diagnoses   Final diagnoses:  Weakness  Diarrhea of presumed infectious origin     Discharge Instructions     We gave you some fluids today and rehydrated you.  I believe that your symptoms are related to dehydration from the diarrhea. Keep trying to drink fluids and stay hydrated at home We have tested you for Covid.  We should have those results tomorrow  I have also done some blood work and I will call you later with the results If your symptoms worsen you will need to go to the ER. Follow up as needed for continued or worsening symptoms     ED Prescriptions    None     PDMP not reviewed this encounter.   Orvan July, NP 09/22/19 1548

## 2019-09-22 NOTE — Telephone Encounter (Signed)
Patient was scheduled for a virtual appt today with Dr Maudie Mercury at 11:40am.  I called the pt and advised since she has passed out she should go to the ER.  Patient stated she passed out for a few seconds yesterday, has had a virus this week with diarrhea and feels very weak and declined going to the ER and asked why could she just not see someone here.  I advised the pt the ER can perform testing and give treatment that we cannot do here if needed and she asked if Dr Elease Hashimoto or Dr Maudie Mercury said she should go to the ER.  I advised the pt both physicians would agree as she should see someone immediately after passing out.  Patient declined and was transferred to [facility].

## 2019-09-22 NOTE — ED Notes (Signed)
PT had 500 cc NS via IV. Epic downtime occurred during this time.

## 2019-09-23 LAB — SARS CORONAVIRUS 2 (TAT 6-24 HRS): SARS Coronavirus 2: NEGATIVE

## 2019-09-29 ENCOUNTER — Telehealth: Payer: Self-pay | Admitting: Family Medicine

## 2019-09-29 DIAGNOSIS — E785 Hyperlipidemia, unspecified: Secondary | ICD-10-CM

## 2019-09-29 DIAGNOSIS — I10 Essential (primary) hypertension: Secondary | ICD-10-CM

## 2019-09-29 NOTE — Chronic Care Management (AMB) (Signed)
  Chronic Care Management   Note  09/29/2019 Name: LASHAE SARACENO MRN: [id number] DOB: [birth year]  ERLYNE CARRUTHERS is a 86 y.o. year old female who is a primary care patient of Burchette, Alinda Sierras, MD. I reached out to Salome Spotted by phone today in response to a referral sent by Ms. Nichola Sizer Mroczka's PCP, [name], MD.   Ms. Boyd was given information about Chronic Care Management services today including:  1. CCM service includes personalized support from designated clinical staff supervised by her physician, including individualized plan of care and coordination with other care providers 2. 24/7 contact phone numbers for assistance for urgent and routine care needs. 3. Service will only be billed when office clinical staff spend 20 minutes or more in a month to coordinate care. 4. Only one practitioner may furnish and bill the service in a calendar month. 5. The patient may stop CCM services at any time (effective at the end of the month) by phone call to the office staff.   Patient agreed to services and verbal consent obtained.   Follow up plan:   [place]

## 2019-09-30 ENCOUNTER — Telehealth: Payer: Self-pay | Admitting: Family Medicine

## 2019-09-30 NOTE — Telephone Encounter (Signed)
Pt has been given information and states she has not tried imodium but she will

## 2019-09-30 NOTE — Telephone Encounter (Signed)
Do not know if she has tried Imodium but if no fever or bloody stools could try Imodium if not already If she is developing dizziness or extreme weakness she probably should go to ER for further evaluation

## 2019-09-30 NOTE — Telephone Encounter (Signed)
Pt stated she is having on/off diarrhea for over a couple of weeks but today is worse with a lil bit of cramping. She is unable to get an appt with her GI doctor until July and she is concerned because it seems to get worse to the point where she cannot manage. She would like some advice from her PCP about what to do or if there is something he can call in?   I offered to set up a virtual visit with him but pt declined.    Pt can be reached at [phone]

## 2019-09-30 NOTE — Telephone Encounter (Signed)
Please advise should pt go to urgent care or ED ?

## 2019-10-04 ENCOUNTER — Other Ambulatory Visit: Payer: Self-pay | Admitting: Family Medicine

## 2019-10-20 ENCOUNTER — Other Ambulatory Visit: Payer: Self-pay

## 2019-10-21 ENCOUNTER — Ambulatory Visit (INDEPENDENT_AMBULATORY_CARE_PROVIDER_SITE_OTHER): Payer: Medicare Other

## 2019-10-21 DIAGNOSIS — E538 Deficiency of other specified B group vitamins: Secondary | ICD-10-CM | POA: Diagnosis not present

## 2019-10-21 MED ORDER — CYANOCOBALAMIN 1000 MCG/ML IJ SOLN
1000.0000 ug | Freq: Once | INTRAMUSCULAR | Status: AC
  Administered 2019-10-21: 1000 ug via INTRAMUSCULAR

## 2019-10-21 NOTE — Progress Notes (Signed)
Per orders of Dr.Burchette , injection of B12 given in Right deltoid by Franco Collet. Patient tolerated injection well.

## 2019-10-21 NOTE — Patient Instructions (Signed)
There are no preventive care reminders to display for this patient.  Depression screen [facility] 2/9 06/22/2019 12/14/2017 12/09/2016  Decreased Interest 0 1 0  Down, Depressed, Hopeless 0 0 0  PHQ - 2 Score 0 1 0  Altered sleeping 1 - -  Tired, decreased energy 1 - -  Change in appetite 0 - -  Feeling bad or failure about yourself  0 - -  Trouble concentrating 0 - -  Moving slowly or fidgety/restless 0 - -  Suicidal thoughts 0 - -  PHQ-9 Score 2 - -  Difficult doing work/chores Not difficult at all - -  Some recent data might be hidden

## 2019-10-26 ENCOUNTER — Other Ambulatory Visit: Payer: Self-pay | Admitting: Family Medicine

## 2019-10-27 ENCOUNTER — Encounter: Payer: Self-pay | Admitting: Nurse Practitioner

## 2019-10-27 ENCOUNTER — Ambulatory Visit: Payer: Medicare Other | Admitting: Nurse Practitioner

## 2019-10-27 ENCOUNTER — Other Ambulatory Visit (INDEPENDENT_AMBULATORY_CARE_PROVIDER_SITE_OTHER): Payer: Medicare Other

## 2019-10-27 VITALS — BP 150/70 | HR 62 | Ht 64.0 in | Wt 95.0 lb

## 2019-10-27 DIAGNOSIS — R197 Diarrhea, unspecified: Secondary | ICD-10-CM

## 2019-10-27 DIAGNOSIS — R634 Abnormal weight loss: Secondary | ICD-10-CM

## 2019-10-27 LAB — CBC WITH DIFFERENTIAL/PLATELET
Basophils Absolute: 0 10*3/uL (ref 0.0–0.1)
Basophils Relative: 0.5 % (ref 0.0–3.0)
Eosinophils Absolute: 0 10*3/uL (ref 0.0–0.7)
Eosinophils Relative: 0.5 % (ref 0.0–5.0)
HCT: 39.2 % (ref 36.0–46.0)
Hemoglobin: 13.2 g/dL (ref 12.0–15.0)
Lymphocytes Relative: 11.7 % — ABNORMAL LOW (ref 12.0–46.0)
Lymphs Abs: 0.9 10*3/uL (ref 0.7–4.0)
MCHC: 33.5 g/dL (ref 30.0–36.0)
MCV: 84.9 fl (ref 78.0–100.0)
Monocytes Absolute: 0.5 10*3/uL (ref 0.1–1.0)
Monocytes Relative: 7.1 % (ref 3.0–12.0)
Neutro Abs: 6.2 10*3/uL (ref 1.4–7.7)
Neutrophils Relative %: 80.2 % — ABNORMAL HIGH (ref 43.0–77.0)
Platelets: 159 10*3/uL (ref 150.0–400.0)
RBC: 4.62 Mil/uL (ref 3.87–5.11)
RDW: 17.6 % — ABNORMAL HIGH (ref 11.5–15.5)
WBC: 7.7 10*3/uL (ref 4.0–10.5)

## 2019-10-27 LAB — COMPREHENSIVE METABOLIC PANEL
ALT: 14 U/L (ref 0–35)
AST: 24 U/L (ref 0–37)
Albumin: 4.2 g/dL (ref 3.5–5.2)
Alkaline Phosphatase: 60 U/L (ref 39–117)
BUN: 19 mg/dL (ref 6–23)
CO2: 30 mEq/L (ref 19–32)
Calcium: 9.4 mg/dL (ref 8.4–10.5)
Chloride: 103 mEq/L (ref 96–112)
Creatinine, Ser: 0.85 mg/dL (ref 0.40–1.20)
GFR: 63.57 mL/min (ref >60.00)
Glucose, Bld: 93 mg/dL (ref 70–99)
Potassium: 3.7 mEq/L (ref 3.5–5.1)
Sodium: 140 mEq/L (ref 135–145)
Total Bilirubin: 0.6 mg/dL (ref 0.2–1.2)
Total Protein: 7.4 g/dL (ref 6.0–8.3)

## 2019-10-27 LAB — C-REACTIVE PROTEIN: CRP: <1 mg/dL (ref 0.5–20.0)

## 2019-10-27 LAB — IGA: IgA: 303 mg/dL (ref 68–378)

## 2019-10-27 NOTE — Progress Notes (Signed)
10/27/2019 [name] [id number] 10/08/1932   Chief Complaint:  Diarrhea  History of Present Illness: [name] is an 86 year old female with a past medical history significant for pernicious anemia with atrophic gastritis and vitamin B12 deficiency, hypertension, coronary artery disease s/p CABG, afib on Eliquis, carotid stenosis, GERD, gastric polyp, colon polyps and lymphocytic colitis.   She presents today accompanied by her husband with complaints of on and off diarrhea for the past 6 months. She reports passing 1 or 2 normal brown bowel movements in the morning followed by 1 or 2 episodes of explosive diarrhea.  The episodes of diarrhea are sporadic. She can go 2 to 3 weeks without having any diarrhea. She went to the urgent care on 09/22/2019 with weakness, dizziness and worsening diarrhea. She reported passing out on the couch for one minute. Her 12 lead EKG was consistent with atrial fibrillation, no acute changes. She was assessed with dehydration. She received IV fluids. She was discharged home with the instructions to go to the ER if her symptoms worsened.  She had normal BMs a few days later. Yesterday, she passed 3 normal brown formed bowel movements.  This morning she passed a solid normal bowel movement then approximately 1 hour later passed a large amount of explosive brown watery diarrhea.  She did not have any diarrhea last week.  She is wearing a depends she has limited warning before the diarrhea episode occurs.  No specific food or stress triggers.  He eats cheese or yogurt several days weekly.  She infrequently eats ice cream.  She avoids milk.  She denies starting any new medications within the past 6 months.  No antibiotics within the past 6 months.  She has lower abdominal cramping discomfort at times but no significant abdominal pain.  No fever. She was diagnosed with lymphocytic colitis at the time of her colonoscopy in 2017. Her diarrhea improved after she took  Budesonide. It is unclear if fiber and Cholestyramine were effective or not.   Her previous weight was 100 pounds.  Her weight in office today is 95 pounds.  Her appetite is decreased.  She feels weak at times.    EGD by Dr. Ardis Hughs 04/07/2019: identified a gastric hyperpalstic polyp which was removed.  No evidence of H. Pylori. Post polypectomy bleeding was treated with epinephrine and 4 endoclips were placed.  Colonoscopy 01/01/2016:  - The examined portion of the ileum was normal. - One 14 mm tubular adenomatous polyp in the rectum, removed with a hot snare. Resected and retrieved. - The examination was otherwise normal on direct and retroflexion views. - Biopsies identified lymphocytic colitis.    Wt Readings from Last 3 Encounters:  10/27/19 95 lb (43.1 kg)  08/23/19 99 lb 9.6 oz (45.2 kg)  08/22/19 100 lb (45.4 kg)    CBC Latest Ref Rng & Units 09/22/2019 07/25/2019 05/23/2019  WBC 4.0 - 10.5 K/uL 5.4 5.5 5.2  Hemoglobin 12.0 - 15.0 g/dL 14.0 13.2 12.1  Hematocrit 36 - 46 % 43.4 42.0 38.8  Platelets 150 - 400 K/uL 132(L) 150 153.0   Current Outpatient Medications on File Prior to Visit  Medication Sig Dispense Refill   acetaminophen (TYLENOL) 500 MG tablet Take 500 mg by mouth every 6 (six) hours as needed (pain).     atorvastatin (LIPITOR) 80 MG tablet TAKE 1 TABLET BY MOUTH DAILY AT BEDTIME 90 tablet 0   Calcium Carbonate-Vitamin D (CALTRATE 600+D PO) Take 1 tablet by  mouth daily with lunch.      carvedilol (COREG) 6.25 MG tablet Take 1 tablet (6.25 mg total) by mouth 2 (two) times daily. 180 tablet 3   cyanocobalamin (,VITAMIN B-12,) 1000 MCG/ML injection Inject 1,000 mcg into the muscle every 30 (thirty) days.     ELIQUIS 2.5 MG TABS tablet TAKE 1 TABLET BY MOUTH TWICE DAILY 180 tablet 3   Ferrous Sulfate (IRON) 28 MG TABS Take 1 tablet by mouth daily.     fluticasone (FLONASE) 50 MCG/ACT nasal spray Place 1 spray daily as needed into both nostrils for allergies or  rhinitis.     HM LIDOCAINE PATCH EX Place 1 patch onto the skin daily as needed (pain.).     hyoscyamine (LEVSIN SL) 0.125 MG SL tablet Place 1 tablet (0.125 mg total) under the tongue every 6 (six) hours as needed. 30 tablet 2   ipratropium (ATROVENT) 0.03 % nasal spray      levothyroxine (SYNTHROID, LEVOTHROID) 50 MCG tablet Take 50 mcg by mouth every evening.      LIDOCAINE EX Apply 1 application topically 3 (three) times daily as needed (pain). Roll-On (otc)     losartan (COZAAR) 100 MG tablet TAKE 1 TABLET BY MOUTH DAILY 90 tablet 0   Multiple Vitamins-Minerals (PRESERVISION AREDS 2 PO) Take 1 tablet by mouth 2 (two) times daily.     Polyethyl Glycol-Propyl Glycol (SYSTANE FREE OP) Place 1 drop into both eyes 3 (three) times daily as needed (dry/irritated eyes.).     sodium chloride (OCEAN) 0.65 % nasal spray Place 1 spray into both nostrils 3 (three) times daily as needed for congestion.      spironolactone (ALDACTONE) 25 MG tablet TAKE 1 TABLET BY MOUTH ONCE DAILY 90 tablet 2   sucralfate (CARAFATE) 1 GM/10ML suspension TAKE 10 MLS (1 G TOTAL) BY MOUTH 2 (TWO) TIMES DAILY. (Patient taking differently: TAKE 10 MLS (1 G TOTAL) BY MOUTH 2 (TWO) TIMES DAILY as needed) 420 mL 1   traMADol (ULTRAM) 50 MG tablet Take 50 mg by mouth as needed for pain.     zoledronic acid (RECLAST) 5 MG/100ML SOLN injection Reclast     No current facility-administered medications on file prior to visit.    Allergies  Allergen Reactions   Actos [Pioglitazone] Swelling    Swelling   Sulfonamide Derivatives Swelling   Adhesive [Tape] Other (See Comments)    WITH PROLONGED EXPOSURE (REDNESS/ITCHING)   Codeine Nausea And Vomiting     Current Medications, Allergies, Past Medical History, Past Surgical History, Family History and Social History were reviewed in [employer] record.   Physical Exam: BP (!) 150/70    Pulse 62    Ht 5\' 4"  (1.626 m)    Wt 95 lb (43.1 kg)     BMI 16.31 kg/m  General:  86 year old female in no acute distress. Head: Normocephalic and atraumatic. Eyes: No scleral icterus. Conjunctiva pink . Ears: Normal auditory acuity. Lungs: Clear throughout to auscultation. Heart: Regular rate and rhythm. Systolic  Murmur. Chest: Soft tissue collection below sternum (patient stated has been present since her heart surgery).  Abdomen: Soft, nondistended. No masses or hepatomegaly. Normal bowel sounds x 4 quadrants.  Rectal: Deferred.  Musculoskeletal: Symmetrical with no gross deformities. Extremities: No edema. Neurological: Alert oriented x 4. No focal deficits.  Psychological: Alert and cooperative. Normal mood and affect  Assessment and Recommendations:  34. 86 year old female with intermittent chronic diarrhea with history of lymphocytic colitis.  -  Check pancreatic elastase to rule out pancreatic insufficiency  -Check GI pathogen panel with next watery diarrhea BM to rule out infectious diarrhea (less likely) -Push fluids, bland diet, avoid dairy products or take Lactaid with each dairy product -I will consult with Dr. Ardis Hughs to verify if patient to start Budesonide 9mg  po now or defer until the above lab results received.  -Patient to call office if symptoms worsen  2. History of tubular adenomatous polyps. Her most recent colonoscopy was  -No further colonoscopies recommended secondary to age  20. IDA. Pernicious anemia. No longer taking Ferrous Sulfate. Hg14  3. Thrombocytopenia. PLT 133.  -Repeat CBC  5. Weight loss -Diet as tolerated -Follow up in office in 2 to 3 weeks, recheck weight

## 2019-10-27 NOTE — Patient Instructions (Addendum)
If you are age 84 or older, your body mass index should be between 23-30. Your Body mass index is 16.31 kg/m. If this is out of the aforementioned range listed, please consider follow up with your Primary Care Provider.  If you are age 84 or younger, your body mass index should be between 19-25. Your Body mass index is 16.31 kg/m. If this is out of the aformentioned range listed, please consider follow up with your Primary Care Provider.   Please purchase the following medications over the counter and take as directed:  1. Florastor probiotic take 1 tablet twice a day. 2. No dairy products for 2-3 weeks or take lactaid tablets with dairy products. 3. Bland diet, Lactose free ensure or boost.  Your provider has requested that you go to the basement level for lab work before leaving today. Press "B" on the elevator. The lab is located at the first door on the left as you exit the elevator.  Due to recent changes in healthcare laws, you may see the results of your imaging and laboratory studies on MyChart before your provider has had a chance to review them.  We understand that in some cases there may be results that are confusing or concerning to you. Not all laboratory results come back in the same time frame and the provider may be waiting for multiple results in order to interpret others.  Please give [place] 48 hours in order for your provider to thoroughly review all the results before contacting the office for clarification of your results.   Thank you for choosing [facility] Noralyn Pick, CRNP

## 2019-10-27 NOTE — Progress Notes (Signed)
I agree with the above note, plan.  I think we should wait until the advised tests are completed prior to starting her on budesonide.  Thanks

## 2019-10-28 LAB — TISSUE TRANSGLUTAMINASE, IGA: (tTG) Ab, IgA: 1 U/mL

## 2019-11-17 ENCOUNTER — Ambulatory Visit: Payer: Medicare Other

## 2019-11-17 DIAGNOSIS — E785 Hyperlipidemia, unspecified: Secondary | ICD-10-CM

## 2019-11-17 DIAGNOSIS — I10 Essential (primary) hypertension: Secondary | ICD-10-CM

## 2019-11-17 NOTE — Telephone Encounter (Signed)
[place],  I am the new clinical pharmacist helping to support [facility]. Can you please place a referral to chronic care management for this patient?   Thanks,  Doristine Section Clinical Pharmacist [facility] [place]

## 2019-11-17 NOTE — Chronic Care Management (AMB) (Signed)
Chronic Care Management Pharmacy  Name: BLAKELY MARANAN  MRN: [id number] DOB: 09-16-1932   Chief Complaint/ HPI  Salome Spotted,  86 y.o. , female presents for their Initial CCM visit with the clinical pharmacist via telephone due to COVID-19 Pandemic.  PCP : [name], MD Patient Care Team: [name], MD as PCP - General (Family Medicine) Sherren Mocha, MD as PCP - Cardiology (Cardiology) Sherren Mocha, MD (Cardiology) Unice Bailey, MD (Internal Medicine) Delrae Rend, MD (Endocrinology) Shon Hough, MD (Ophthalmology) Barbaraann Rondo, MD (Obstetrics and Gynecology) Rolm Bookbinder, MD (Dermatology) Germaine Pomfret, [facility] as Pharmacist (Pharmacist)  Their chronic conditions include: Hypertension, Hyperlipidemia, Atrial Fibrillation, Coronary Artery Disease, Hypothyroidism and Osteoporosis   Office Visits: 08/23/19: Patient presented to Dr. Elease Hashimoto for follow-up. Cephalexin stopped (completed course). No medication changes made.  08/01/19: Patient presented to Dr. Elease Hashimoto for hospital follow-up.  07/20/19: Patient presented to Dr. Elease Hashimoto for headache.  06/22/19: Patient presented to Dr. Elease Hashimoto for AWV. Azelastine stopped (change in therapy)  Consult Visit: 10/27/19: Patient referred to Carl Best, NP (GI) for Diarrhea. Waiting for results prior to starting budesonide. 09/22/19: Patient presented to ED for diarrhea/weakness.  08/22/19: Patient presented to Dr. Burt Knack (Cardiology) for follow-up. Bp 148/82, Carvedilol increased to 6.25 mg BID. Aspirin stopped (completed course).  07/25/19: Patient presented to ED for vaginal bleeding. Patient started cephalexin 500 mg TID for 7 days.   Today, patient is in good spirits. She has been focusing on building her strength up and feels she has been doing much better overall. She has felt stronger and less worried about falling.   She continues to suffer from diarrhea, but states it has  improved significantly since using Lactaid and Florastor.  She lives alone with her husband. She still has some fear of leaving her home due to the pandemic and her weakness, so her husband does most of the shopping while she does most of the cooking. She spends her time cleaning her house, reading, listening to music, and doing puzzles or sodoku. Her husband works part time at [employer].   She has a living sister living in [place], and her husband has a living brother living at an assisted living facility in [place].  Allergies  Allergen Reactions  . Actos [Pioglitazone] Swelling    Swelling  . Sulfonamide Derivatives Swelling  . Adhesive [Tape] Other (See Comments)    WITH PROLONGED EXPOSURE (REDNESS/ITCHING)  . Codeine Nausea And Vomiting    Medications: Outpatient Encounter Medications as of 11/17/2019  Medication Sig Note  . acetaminophen (TYLENOL) 500 MG tablet Take 500 mg by mouth every 6 (six) hours as needed (pain).   Marland Kitchen atorvastatin (LIPITOR) 80 MG tablet TAKE 1 TABLET BY MOUTH DAILY AT BEDTIME   . Calcium Carbonate-Vitamin D (CALTRATE 600+D PO) Take 1 tablet by mouth daily with lunch.    . carvedilol (COREG) 6.25 MG tablet Take 1 tablet (6.25 mg total) by mouth 2 (two) times daily.   . cyanocobalamin (,VITAMIN B-12,) 1000 MCG/ML injection Inject 1,000 mcg into the muscle every 30 (thirty) days.   Marland Kitchen ELIQUIS 2.5 MG TABS tablet TAKE 1 TABLET BY MOUTH TWICE DAILY   . fluticasone (FLONASE) 50 MCG/ACT nasal spray Place 1 spray daily as needed into both nostrils for allergies or rhinitis.   . hyoscyamine (LEVSIN SL) 0.125 MG SL tablet Place 1 tablet (0.125 mg total) under the tongue every 6 (six) hours as needed.   Marland Kitchen ipratropium (ATROVENT) 0.03 % nasal spray  Place 1 spray into both nostrils 2 (two) times daily.    Marland Kitchen lactase (LACTAID) 3000 units tablet Take 3,000 Units by mouth 3 (three) times daily with meals as needed.   Marland Kitchen levothyroxine (SYNTHROID, LEVOTHROID) 50 MCG tablet Take 50  mcg by mouth every evening.    Marland Kitchen LIDOCAINE EX Apply 1 application topically 3 (three) times daily as needed (pain). Roll-On (otc)   . loratadine (CLARITIN) 10 MG tablet Take 10 mg by mouth daily as needed for allergies.   Marland Kitchen losartan (COZAAR) 100 MG tablet TAKE 1 TABLET BY MOUTH DAILY   . Multiple Vitamins-Minerals (PRESERVISION AREDS 2 PO) Take 1 tablet by mouth 2 (two) times daily.   Vladimir Faster Glycol-Propyl Glycol (SYSTANE FREE OP) Place 1 drop into both eyes 3 (three) times daily as needed (dry/irritated eyes.).   Marland Kitchen saccharomyces boulardii (FLORASTOR) 250 MG capsule Take 250 mg by mouth 2 (two) times daily.   . sodium chloride (OCEAN) 0.65 % nasal spray Place 1 spray into both nostrils 3 (three) times daily as needed for congestion.    Marland Kitchen spironolactone (ALDACTONE) 25 MG tablet TAKE 1 TABLET BY MOUTH ONCE DAILY   . sucralfate (CARAFATE) 1 GM/10ML suspension TAKE 10 MLS (1 G TOTAL) BY MOUTH 2 (TWO) TIMES DAILY. (Patient taking differently: TAKE 10 MLS (1 G TOTAL) BY MOUTH 2 (TWO) TIMES DAILY as needed)   . traMADol (ULTRAM) 50 MG tablet Take 50 mg by mouth as needed for pain.   . Ferrous Sulfate (IRON) 28 MG TABS Take 1 tablet by mouth daily. (Patient not taking: Reported on 11/17/2019)   . HM LIDOCAINE PATCH EX Place 1 patch onto the skin daily as needed (pain.).   Marland Kitchen zoledronic acid (RECLAST) 5 MG/100ML SOLN injection Reclast (Patient not taking: Reported on 11/17/2019) 03/30/2019: YEARLY   No facility-administered encounter medications on file as of 11/17/2019.   Current Diagnosis/Assessment:  SDOH Interventions     Most Recent Value  SDOH Interventions  Financial Strain Interventions Intervention Not Indicated  Transportation Interventions Intervention Not Indicated     Goals Addressed            This Visit's Progress   . Chronic Care Management       CARE PLAN ENTRY (see longitudinal plan of care for additional care plan information)  Current Barriers:  . Chronic Disease  Management support, education, and care coordination needs related to Hypertension, Hyperlipidemia, Atrial Fibrillation, Coronary Artery Disease, Hypothyroidism and Osteoporosis    Hypertension BP Readings from Last 3 Encounters:  10/27/19 (!) 150/70  09/22/19 125/63  08/23/19 130/78   . Pharmacist Clinical Goal(s): o Over the next 90 days, patient will work with PharmD and providers to maintain BP goal <140/90 . Current regimen:   Carvedilol 6.25 mg twice daily   Losartan 100 mg daily   Spirinolactone 25 mg daily   . Interventions: o Discussed low salt diet and exercising as tolerated extensively . Patient self care activities - Over the next 90 days, patient will: o Check blood pressure weekly, document, and provide at future appointments o Ensure daily salt intake < 2300 mg/day  Hyperlipidemia Lab Results  Component Value Date/Time   LDLCALC 66 11/22/2014 09:19 AM   . Pharmacist Clinical Goal(s): o Over the next 90 days, patient will work with PharmD and providers to maintain LDL goal < 70 . Current regimen:  o Atorvastatin 80 mg daily  . Interventions: o Discussed low cholesterol diet and exercising as tolerated extensively  Medication management .  Pharmacist Clinical Goal(s): o Over the next 90 days, patient will work with PharmD and providers to maintain optimal medication adherence . Current pharmacy: [facility] . Interventions o Comprehensive medication review performed. o Continue current medication management strategy . Patient self care activities - Over the next 90 days, patient will: o Take medications as prescribed o Report any questions or concerns to PharmD and/or provider(s)       AFIB   Patient is currently rate controlled.  Patient has failed these meds in past: n/a Patient is currently controlled on the following medications:  . Carvedilol 6.25 mg BID  . Eliquis 2.5 mg BID (states pharamacy does not run through insurance costs $45)    We discussed: Sometimes feels her heart is out rhythm, often when she lies down to rest. Does have small bruising spots, but denies unusual bruising/bleeding.   Plan  Continue current medications  Hypertension   BP goal is:  <140/90  Office blood pressures are  BP Readings from Last 3 Encounters:  10/27/19 (!) 150/70  09/22/19 125/63  08/23/19 130/78   Patient checks BP at home several times per month Patient home BP readings are ranging: "good"   Patient has failed these meds in the past: n/a Patient is currently controlled on the following medications:  . Carvedilol 6.25 mg BID  . Losartan 100 mg daily (EM) . Spirinolactone 25 mg daily    We discussed diet and exercise extensively   Blood pressure monitor not working.    Plan  Continue current medications   Hyperlipidemia   LDL goal < 70  Lipid Panel     Component Value Date/Time   CHOL 135 11/18/2016 0000   TRIG 54 11/18/2016 0000   TRIG 85 02/05/2006 0818   HDL 69.20 11/22/2014 0919   LDLCALC 66 11/22/2014 0919    Hepatic Function Latest Ref Rng & Units 10/27/2019 09/22/2019 07/25/2019  Total Protein 6.0 - 8.3 g/dL 7.4 7.2 6.3(L)  Albumin 3.5 - 5.2 g/dL 4.2 3.7 3.8  AST 0 - 37 U/L 24 34 34  ALT 0 - 35 U/L _0 Alk Phosphatase 39 - 117 U/L 60 47 42  Total Bilirubin 0.2 - 1.2 mg/dL 0.6 0.9 0.6  Bilirubin, Direct 0.0 - 0.3 mg/dL - - -     The ASCVD Risk score Mikey Bussing DC Jr., et al., 2013) failed to calculate for the following reasons:   The 2013 ASCVD risk score is only valid for ages 63 to 35   Patient has failed these meds in past: n/a Patient is currently controlled on the following medications:  . Atorvastatin 80 mg daily   We discussed:  diet and exercise extensively  Plan  Continue current medications   Hypothyroidism   Lab Results  Component Value Date/Time   TSH 4.390 04/05/2018 12:02 PM   TSH 3.80 11/18/2016 12:00 AM   TSH 0.80 10/26/2012 04:00 PM    Patient has failed these  meds in past: n/a Patient is currently controlled on the following medications:  . Levothyroxine 50 mcg daily (HS)   We discussed: Takes at bedtime to separate from other medications.   Plan  Continue current medications  Osteoporosis   Managed by Dr. Buddy Duty    Last DEXA Scan: Jan 2018 (Not in chart)    T-Score femoral neck: n/a  T-Score total hip: n/a  T-Score lumbar spine: n/a  T-Score forearm radius: n/a  10-year probability of major osteoporotic fracture: n/a  10-year probability of hip  fracture: n/a  No results found for: VD25OH   Patient has taken these meds in past: Zoledronic Acid 5 mg/100 mL (2013-2019)  Patient is currently controlled on the following medications:  . Calcium Carbonate-Vit D 600 mg + D daily (sometimes forgets to take)    We discussed:  Recommend [phone] units of vitamin D daily. Recommend 1200 mg of calcium daily from dietary and supplemental sources.  Plan  Continue current medications  Chronic Pain   Cervical Spondylosis   Patient has failed these meds in past: n/a Patient is currently controlled on the following medications:  . APAP 500 mg q6hr PRN (Hasn't used in 2 weeks) . Lidocaine patch daily PRN (difficult to use) . Lidocaine Roll-On TID PRN (uses daily)  . Tramadol 50 mg PRN (hasn't need in one month)   We discussed:  Awaiting surgery for lumbar procedure, on hold since Covid-19  Plan  Continue current medications  Allergic Rhinitis   "Sinus Disease" Has Allergist doctor.   Patient has failed these meds in past: n/a Patient is currently controlled on the following medications:  . Flonase 50 mcg/act 1 spray daily . Ocean 0.65% nasal spray 1 spray TID PRN  . Claritin 10 mg daily PRN -severe allergies.  We discussed:  Still struggling with post nasal drip   Plan  Continue current medications   Misc / OTC   . Vitamin B12 IM 1030mg every 30 days (comes into the clinic 11/21/19)  . Ferrous Sulfate 28 mg daily   . Hyoscyamine 0.125 mg SL q6hr PRN -  . Atrovent nasal spray  . Preservision Areds 2 PO  . Systane Free OP 1 drop both eyes TID PRN  . Sucralfate 1g/155mBID PRN   Vitamin B-12  Date Value Ref Range Status  05/23/2019 >1500 (H) 211 - 911 pg/mL Final   Hemoglobin  Date Value Ref Range Status  10/27/2019 13.2 12.0 - 15.0 g/dL Final   We discussed:  Counseled patient to separate sucralfate from other medications by at least 2 hours.   Plan  Recommend stopping ferrous sulfate due to sustained stable hemoglobin levels.   Vaccines   Reviewed and discussed patient's vaccination history.    Immunization History  Administered Date(s) Administered  . Fluad Quad(high Dose 65+) 01/24/2019  . Influenza Whole 02/18/2008, 02/26/2010  . Influenza, High Dose Seasonal PF 02/10/2017, 03/03/2018  . Influenza,inj,Quad PF,6+ Mos 02/01/2014  . Influenza-Unspecified 01/27/2012, 01/27/2015, 02/22/2016  . PFIZER SARS-COV-2 Vaccination 05/30/2019, 06/13/2019  . Pneumococcal Conjugate-13 08/25/2013  . Pneumococcal Polysaccharide-23 06/27/2004  . Zoster 01/27/2015    Medication Management   Pt uses [facility] pharmacy for all medications. Sometimes forgets supplements, endorses adherence to prescription medications.  Uses pill box? Yes, AM and PM box that she fills herself weekly.   Plan  Continue current medication management strategy  Follow up: 6 month phone visit  [facility]

## 2019-11-18 NOTE — Addendum Note (Signed)
Addended by: Rodrigo Ran on: 11/18/2019 07:21 AM   Modules accepted: Orders

## 2019-11-18 NOTE — Telephone Encounter (Signed)
Referral placed as requested.

## 2019-11-18 NOTE — Patient Instructions (Addendum)
Visit Information It was great speaking with you today!  Please let me know if you have any questions about our visit.  Goals Addressed            This Visit's Progress   . Chronic Care Management       CARE PLAN ENTRY (see longitudinal plan of care for additional care plan information)  Current Barriers:  . Chronic Disease Management support, education, and care coordination needs related to Hypertension, Hyperlipidemia, Atrial Fibrillation, Coronary Artery Disease, Hypothyroidism and Osteoporosis    Hypertension BP Readings from Last 3 Encounters:  10/27/19 (!) 150/70  09/22/19 125/63  08/23/19 130/78   . Pharmacist Clinical Goal(s): o Over the next 90 days, patient will work with PharmD and providers to maintain BP goal <140/90 . Current regimen:   Carvedilol 6.25 mg twice daily   Losartan 100 mg daily   Spirinolactone 25 mg daily   . Interventions: o Discussed low salt diet and exercising as tolerated extensively . Patient self care activities - Over the next 90 days, patient will: o Check blood pressure weekly, document, and provide at future appointments o Ensure daily salt intake < 2300 mg/day  Hyperlipidemia Lab Results  Component Value Date/Time   LDLCALC 66 11/22/2014 09:19 AM   . Pharmacist Clinical Goal(s): o Over the next 90 days, patient will work with PharmD and providers to maintain LDL goal < 70 . Current regimen:  o Atorvastatin 80 mg daily  . Interventions: o Discussed low cholesterol diet and exercising as tolerated extensively  Medication management . Pharmacist Clinical Goal(s): o Over the next 90 days, patient will work with PharmD and providers to maintain optimal medication adherence . Current pharmacy: [facility] . Interventions o Comprehensive medication review performed. o Continue current medication management strategy . Patient self care activities - Over the next 90 days, patient will: o Take medications as  prescribed o Report any questions or concerns to PharmD and/or provider(s)       [name] was given information about Chronic Care Management services today including:  1. CCM service includes personalized support from designated clinical staff supervised by her physician, including individualized plan of care and coordination with other care providers 2. 24/7 contact phone numbers for assistance for urgent and routine care needs. 3. Standard insurance, coinsurance, copays and deductibles apply for chronic care management only during months in which we provide at least 20 minutes of these services. Most insurances cover these services at 100%, however patients may be responsible for any copay, coinsurance and/or deductible if applicable. This service may help you avoid the need for more expensive face-to-face services. 4. Only one practitioner may furnish and bill the service in a calendar month. 5. The patient may stop CCM services at any time (effective at the end of the month) by phone call to the office staff.  Patient agreed to services and verbal consent obtained.   The patient verbalized understanding of instructions provided today and agreed to receive a mailed copy of patient instruction and/or educational materials. Telephone follow up appointment with pharmacy team member scheduled for: 05/23/20 at 1:00 PM   [facility] [place]  [place] stands for "Dietary Approaches to Stop Hypertension." The DASH eating plan is a healthy eating plan that has been shown to reduce high blood pressure (hypertension). It may also reduce your risk for type 2 diabetes, heart disease, and stroke. The DASH eating plan may also help  with weight loss. What are tips for following this plan?  General guidelines  Avoid eating more than 2,300 mg (milligrams) of salt (sodium) a day. If you have hypertension, you may need to  reduce your sodium intake to 1,500 mg a day.  Limit alcohol intake to no more than 1 drink a day for nonpregnant women and 2 drinks a day for men. One drink equals 12 oz of beer, 5 oz of wine, or 1 oz of hard liquor.  Work with your health care provider to maintain a healthy body weight or to lose weight. Ask what an ideal weight is for you.  Get at least 30 minutes of exercise that causes your heart to beat faster (aerobic exercise) most days of the week. Activities may include walking, swimming, or biking.  Work with your health care provider or diet and nutrition specialist (dietitian) to adjust your eating plan to your individual calorie needs. Reading food labels   Check food labels for the amount of sodium per serving. Choose foods with less than 5 percent of the Daily Value of sodium. Generally, foods with less than 300 mg of sodium per serving fit into this eating plan.  To find whole grains, look for the word "whole" as the first word in the ingredient list. Shopping  Buy products labeled as "low-sodium" or "no salt added."  Buy fresh foods. Avoid canned foods and premade or frozen meals. Cooking  Avoid adding salt when cooking. Use salt-free seasonings or herbs instead of table salt or sea salt. Check with your health care provider or pharmacist before using salt substitutes.  Do not fry foods. Cook foods using healthy methods such as baking, boiling, grilling, and broiling instead.  Cook with heart-healthy oils, such as olive, canola, soybean, or sunflower oil. Meal planning  Eat a balanced diet that includes: ? 5 or more servings of fruits and vegetables each day. At each meal, try to fill half of your plate with fruits and vegetables. ? Up to 6-8 servings of whole grains each day. ? Less than 6 oz of lean meat, poultry, or fish each day. A 3-oz serving of meat is about the same size as a deck of cards. One egg equals 1 oz. ? 2 servings of low-fat dairy each day. ? A  serving of nuts, seeds, or beans 5 times each week. ? Heart-healthy fats. Healthy fats called Omega-3 fatty acids are found in foods such as flaxseeds and coldwater fish, like sardines, salmon, and mackerel.  Limit how much you eat of the following: ? Canned or prepackaged foods. ? Food that is high in trans fat, such as fried foods. ? Food that is high in saturated fat, such as fatty meat. ? Sweets, desserts, sugary drinks, and other foods with added sugar. ? Full-fat dairy products.  Do not salt foods before eating.  Try to eat at least 2 vegetarian meals each week.  Eat more home-cooked food and less restaurant, buffet, and fast food.  When eating at a restaurant, ask that your food be prepared with less salt or no salt, if possible. What foods are recommended? The items listed may not be a complete list. Talk with your dietitian about what dietary choices are best for you. Grains Whole-grain or whole-wheat bread. Whole-grain or whole-wheat pasta. Brown rice. [place]. Bulgur. Whole-grain and low-sodium cereals. Pita bread. Low-fat, low-sodium crackers. Whole-wheat flour tortillas. Vegetables Fresh or frozen vegetables (raw, steamed, roasted, or grilled). Low-sodium or reduced-sodium tomato and vegetable juice.  Low-sodium or reduced-sodium tomato sauce and tomato paste. Low-sodium or reduced-sodium canned vegetables. Fruits All fresh, dried, or frozen fruit. Canned fruit in natural juice (without added sugar). Meat and other protein foods Skinless chicken or [place]. Ground chicken or [place]. Pork with fat trimmed off. Fish and seafood. Egg whites. Dried beans, peas, or lentils. Unsalted nuts, nut butters, and seeds. Unsalted canned beans. Lean cuts of beef with fat trimmed off. Low-sodium, lean deli meat. Dairy Low-fat (1%) or fat-free (skim) milk. Fat-free, low-fat, or reduced-fat cheeses. Nonfat, low-sodium ricotta or cottage cheese. Low-fat or nonfat yogurt. Low-fat,  low-sodium cheese. Fats and oils Soft margarine without trans fats. Vegetable oil. Low-fat, reduced-fat, or light mayonnaise and salad dressings (reduced-sodium). Canola, safflower, olive, soybean, and sunflower oils. Avocado. Seasoning and other foods Herbs. Spices. Seasoning mixes without salt. Unsalted popcorn and pretzels. Fat-free sweets. What foods are not recommended? The items listed may not be a complete list. Talk with your dietitian about what dietary choices are best for you. Grains Baked goods made with fat, such as croissants, muffins, or some breads. Dry pasta or rice meal packs. Vegetables Creamed or fried vegetables. Vegetables in a cheese sauce. Regular canned vegetables (not low-sodium or reduced-sodium). Regular canned tomato sauce and paste (not low-sodium or reduced-sodium). Regular tomato and vegetable juice (not low-sodium or reduced-sodium). Angie Fava. Olives. Fruits Canned fruit in a light or heavy syrup. Fried fruit. Fruit in cream or butter sauce. Meat and other protein foods Fatty cuts of meat. Ribs. Fried meat. Berniece Salines. Sausage. Bologna and other processed lunch meats. Salami. Fatback. Hotdogs. Bratwurst. Salted nuts and seeds. Canned beans with added salt. Canned or smoked fish. Whole eggs or egg yolks. Chicken or [place] with skin. Dairy Whole or 2% milk, cream, and half-and-half. Whole or full-fat cream cheese. Whole-fat or sweetened yogurt. Full-fat cheese. Nondairy creamers. Whipped toppings. Processed cheese and cheese spreads. Fats and oils Butter. Stick margarine. Lard. Shortening. Ghee. Bacon fat. Tropical oils, such as coconut, palm kernel, or palm oil. Seasoning and other foods Salted popcorn and pretzels. Onion salt, garlic salt, seasoned salt, table salt, and sea salt. Worcestershire sauce. Tartar sauce. Barbecue sauce. Teriyaki sauce. Soy sauce, including reduced-sodium. Steak sauce. Canned and packaged gravies. Fish sauce. Oyster sauce. Cocktail sauce.  Horseradish that you find on the shelf. Ketchup. Mustard. Meat flavorings and tenderizers. Bouillon cubes. Hot sauce and Tabasco sauce. Premade or packaged marinades. Premade or packaged taco seasonings. Relishes. Regular salad dressings. Where to find more information:  National Heart, Lung, and [place]: https://wilson-eaton.com/  American Heart Association: www.heart.org Summary  The DASH eating plan is a healthy eating plan that has been shown to reduce high blood pressure (hypertension). It may also reduce your risk for type 2 diabetes, heart disease, and stroke.  With the DASH eating plan, you should limit salt (sodium) intake to 2,300 mg a day. If you have hypertension, you may need to reduce your sodium intake to 1,500 mg a day.  When on the DASH eating plan, aim to eat more fresh fruits and vegetables, whole grains, lean proteins, low-fat dairy, and heart-healthy fats.  Work with your health care provider or diet and nutrition specialist (dietitian) to adjust your eating plan to your individual calorie needs. This information is not intended to replace advice given to you by your health care provider. Make sure you discuss any questions you have with your health care provider. Document Revised: 03/27/2017 Document Reviewed: 04/07/2016 Elsevier Patient Education  2020 [employer].

## 2019-11-21 ENCOUNTER — Other Ambulatory Visit: Payer: Self-pay

## 2019-11-21 ENCOUNTER — Ambulatory Visit (INDEPENDENT_AMBULATORY_CARE_PROVIDER_SITE_OTHER): Payer: Medicare Other

## 2019-11-21 DIAGNOSIS — E538 Deficiency of other specified B group vitamins: Secondary | ICD-10-CM

## 2019-11-21 MED ORDER — CYANOCOBALAMIN 1000 MCG/ML IJ SOLN
1000.0000 ug | Freq: Once | INTRAMUSCULAR | Status: AC
  Administered 2019-11-21: 1000 ug via INTRAMUSCULAR

## 2019-11-21 NOTE — Patient Instructions (Signed)
There are no preventive care reminders to display for this patient.  Depression screen [facility] 2/9 06/22/2019 12/14/2017 12/09/2016  Decreased Interest 0 1 0  Down, Depressed, Hopeless 0 0 0  PHQ - 2 Score 0 1 0  Altered sleeping 1 - -  Tired, decreased energy 1 - -  Change in appetite 0 - -  Feeling bad or failure about yourself  0 - -  Trouble concentrating 0 - -  Moving slowly or fidgety/restless 0 - -  Suicidal thoughts 0 - -  PHQ-9 Score 2 - -  Difficult doing work/chores Not difficult at all - -  Some recent data might be hidden

## 2019-11-21 NOTE — Progress Notes (Signed)
Per orders of Dr.Burchette , injection of B12 given in Left deltoid by Franco Collet. Patient tolerated injection well.

## 2019-12-05 ENCOUNTER — Ambulatory Visit (INDEPENDENT_AMBULATORY_CARE_PROVIDER_SITE_OTHER): Payer: Medicare Other | Admitting: Family Medicine

## 2019-12-05 ENCOUNTER — Other Ambulatory Visit: Payer: Self-pay

## 2019-12-05 ENCOUNTER — Encounter: Payer: Self-pay | Admitting: Family Medicine

## 2019-12-05 VITALS — BP 120/68 | HR 81 | Temp 97.7°F | Wt 95.6 lb

## 2019-12-05 DIAGNOSIS — G8929 Other chronic pain: Secondary | ICD-10-CM | POA: Diagnosis not present

## 2019-12-05 DIAGNOSIS — M545 Low back pain, unspecified: Secondary | ICD-10-CM

## 2019-12-05 DIAGNOSIS — R109 Unspecified abdominal pain: Secondary | ICD-10-CM

## 2019-12-05 DIAGNOSIS — M25551 Pain in right hip: Secondary | ICD-10-CM | POA: Diagnosis not present

## 2019-12-05 DIAGNOSIS — R10A1 Flank pain, right side: Secondary | ICD-10-CM

## 2019-12-05 LAB — POCT URINALYSIS DIPSTICK
Bilirubin, UA: NEGATIVE
Glucose, UA: NEGATIVE
Ketones, UA: NEGATIVE
Leukocytes, UA: NEGATIVE
Nitrite, UA: POSITIVE
Protein, UA: NEGATIVE
Spec Grav, UA: 1.025 (ref 1.010–1.025)
Urobilinogen, UA: 0.2 E.U./dL
pH, UA: 6 (ref 5.0–8.0)

## 2019-12-05 MED ORDER — CEPHALEXIN 500 MG PO CAPS: 500.0000 mg | ORAL_CAPSULE | Freq: Three times a day (TID) | ORAL | 0 refills | Status: DC

## 2019-12-05 NOTE — Progress Notes (Signed)
Established Patient Office Visit  Subjective:  Patient ID: [name], female    DOB: 1932/10/26  Age: 86 y.o. MRN: [id number]  CC:  Chief Complaint  Patient presents with  . Hip Pain    right hip painsince last week and got worse over the weekend     HPI [name] presents for very poorly localized pain right lumbar area but also radiating up toward the right thoracic lower rib cage area and also right lateral hip region.  She does have history of known degenerative changes in her back but she states this pain is different.  Her onset was around a week ago.  Denies any recent fall or injury.  She did relatively well through the last week but on Saturday her pain seemed to worsen.  This again is very poorly localized.  She also has some pain around the flank region and was worried about possible kidney stones.  No burning with urination.  No gross hematuria.  No fevers or chills.  Pain is slightly worse with movement for example turning over in bed.  She is taken some Tylenol which helps slightly.  No appetite or weight changes.  Denies any associated abdominal pain  She has seen neurosurgeon in the past. She was looking at getting surgery just prior to the pandemic. Her symptoms seem to stabilize for a while but have recently resurfaced  Past Medical History:  Diagnosis Date  . Allergy   . Anemia, pernicious   . Arthritis    bursitis   . Atrophic gastritis   . B12 deficiency   . CAD (coronary artery disease)   . Carotid artery stenosis   . Cataract   . Diarrhea    on and off for months  . Family history of adverse reaction to anesthesia    extreme nausea brother and sister  . Fibromyalgia   . Gallbladder polyp   . GERD (gastroesophageal reflux disease)   . Heart murmur   . History of hiatal hernia   . History of kidney stones   . HLD (hyperlipidemia)   . HTN (hypertension)   . Hypothyroidism   . Irritable bowel syndrome (IBS)   . Median arcuate ligament  syndrome ([place])   . Osteoporosis   . PVD (peripheral vascular disease) ([place])   . Tubular adenoma of colon 08/2013   with focal high grade dysplasia    Past Surgical History:  Procedure Laterality Date  . ABDOMINAL HYSTERECTOMY    . APPENDECTOMY    . BIOPSY THYROID    . cataract surgery    . CHOLECYSTECTOMY N/A 10/24/2014   Procedure: MICROSCOPIC  CHOLECYSTECTOMY;  Surgeon: Ralene Ok, MD;  Location: [facility];  Service: General;  Laterality: N/A;  . COLONOSCOPY    . CORONARY ARTERY BYPASS GRAFT  07  . ESOPHAGEAL MANOMETRY N/A 08/02/2012   Procedure: ESOPHAGEAL MANOMETRY (EM);  Surgeon: Sable Feil, MD;  Location: [facility];  Service: Endoscopy;  Laterality: N/A;  . ESOPHAGOGASTRODUODENOSCOPY (EGD) WITH PROPOFOL N/A 04/07/2019   Procedure: ESOPHAGOGASTRODUODENOSCOPY (EGD) WITH PROPOFOL;  Surgeon: Milus Banister, MD;  Location: [facility];  Service: Endoscopy;  Laterality: N/A;  . HEMOSTASIS CLIP PLACEMENT  04/07/2019   Procedure: HEMOSTASIS CLIP PLACEMENT;  Surgeon: Milus Banister, MD;  Location: [facility];  Service: Endoscopy;;  . HOT HEMOSTASIS N/A 04/07/2019   Procedure: HOT HEMOSTASIS (ARGON PLASMA COAGULATION/BICAP);  Surgeon: Milus Banister, MD;  Location: [facility];  Service: Endoscopy;  Laterality: N/A;  .  POLYPECTOMY  04/07/2019   Procedure: POLYPECTOMY;  Surgeon: Milus Banister, MD;  Location: [facility];  Service: Endoscopy;;  . [name]  04/07/2019   Procedure: [name];  Surgeon: Milus Banister, MD;  Location: [facility];  Service: Endoscopy;;  . THYROID LOBECTOMY    . UPPER GASTROINTESTINAL ENDOSCOPY      Family History  Problem Relation Age of Onset  . Heart disease Mother   . Cholelithiasis Mother   . Prostate cancer Brother   . Colon cancer Brother   . Breast cancer Other        niece - breast cancer  . Diabetes Sister   . Lung cancer Father   . Lung cancer Sister   . Stomach cancer Cousin   . Esophageal cancer Neg Hx     . Rectal cancer Neg Hx     Social History   Socioeconomic History  . Marital status: Married    Spouse name: [name]  . Number of children: 0  . Years of education: 75  . Highest education level: Not on file  Occupational History  . Occupation: retired    Fish farm manager: RETIRED  Tobacco Use  . Smoking status: Never Smoker  . Smokeless tobacco: Never Used  Vaping Use  . Vaping Use: Never used  Substance and Sexual Activity  . Alcohol use: No    Alcohol/week: 0.0 standard drinks  . Drug use: No  . Sexual activity: Not on file  Other Topics Concern  . Not on file  Social History Narrative   [name], Married. Lives with husband. Had been a very active woman.   Social Determinants of Health   Financial Resource Strain: Low Risk   . Difficulty of Paying Living Expenses: Not hard at all  Food Insecurity:   . Worried About Charity fundraiser in the Last Year:   . Arboriculturist in the Last Year:   Transportation Needs: No Transportation Needs  . Lack of Transportation (Medical): No  . Lack of Transportation (Non-Medical): No  Physical Activity:   . Days of Exercise per Week:   . Minutes of Exercise per Session:   Stress:   . Feeling of Stress :   Social Connections:   . Frequency of Communication with Friends and Family:   . Frequency of Social Gatherings with Friends and Family:   . Attends Religious Services:   . Active Member of Clubs or Organizations:   . Attends Archivist Meetings:   Marland Kitchen Marital Status:   Intimate Partner Violence:   . Fear of Current or Ex-Partner:   . Emotionally Abused:   Marland Kitchen Physically Abused:   . Sexually Abused:     Outpatient Medications Prior to Visit  Medication Sig Dispense Refill  . acetaminophen (TYLENOL) 500 MG tablet Take 500 mg by mouth every 6 (six) hours as needed (pain).    Marland Kitchen atorvastatin (LIPITOR) 80 MG tablet TAKE 1 TABLET BY MOUTH DAILY AT BEDTIME 90 tablet 0  . Calcium Carbonate-Vitamin D (CALTRATE 600+D PO) Take 1  tablet by mouth daily with lunch.     . carvedilol (COREG) 6.25 MG tablet Take 1 tablet (6.25 mg total) by mouth 2 (two) times daily. 180 tablet 3  . cyanocobalamin (,VITAMIN B-12,) 1000 MCG/ML injection Inject 1,000 mcg into the muscle every 30 (thirty) days.    Marland Kitchen ELIQUIS 2.5 MG TABS tablet TAKE 1 TABLET BY MOUTH TWICE DAILY 180 tablet 3  . Ferrous Sulfate (IRON) 28 MG TABS Take 1  tablet by mouth daily.     . fluticasone (FLONASE) 50 MCG/ACT nasal spray Place 1 spray daily as needed into both nostrils for allergies or rhinitis.    Marland Kitchen HM LIDOCAINE PATCH EX Place 1 patch onto the skin daily as needed (pain.).    Marland Kitchen hyoscyamine (LEVSIN SL) 0.125 MG SL tablet Place 1 tablet (0.125 mg total) under the tongue every 6 (six) hours as needed. 30 tablet 2  . ipratropium (ATROVENT) 0.03 % nasal spray Place 1 spray into both nostrils 2 (two) times daily.     Marland Kitchen lactase (LACTAID) 3000 units tablet Take 3,000 Units by mouth 3 (three) times daily with meals as needed.    Marland Kitchen levothyroxine (SYNTHROID, LEVOTHROID) 50 MCG tablet Take 50 mcg by mouth every evening.     Marland Kitchen LIDOCAINE EX Apply 1 application topically 3 (three) times daily as needed (pain). Roll-On (otc)    . loratadine (CLARITIN) 10 MG tablet Take 10 mg by mouth daily as needed for allergies.    Marland Kitchen losartan (COZAAR) 100 MG tablet TAKE 1 TABLET BY MOUTH DAILY 90 tablet 0  . Multiple Vitamins-Minerals (PRESERVISION AREDS 2 PO) Take 1 tablet by mouth 2 (two) times daily.    Vladimir Faster Glycol-Propyl Glycol (SYSTANE FREE OP) Place 1 drop into both eyes 3 (three) times daily as needed (dry/irritated eyes.).    Marland Kitchen saccharomyces boulardii (FLORASTOR) 250 MG capsule Take 250 mg by mouth 2 (two) times daily.    . sodium chloride (OCEAN) 0.65 % nasal spray Place 1 spray into both nostrils 3 (three) times daily as needed for congestion.     Marland Kitchen spironolactone (ALDACTONE) 25 MG tablet TAKE 1 TABLET BY MOUTH ONCE DAILY 90 tablet 2  . sucralfate (CARAFATE) 1 GM/10ML  suspension TAKE 10 MLS (1 G TOTAL) BY MOUTH 2 (TWO) TIMES DAILY. (Patient taking differently: TAKE 10 MLS (1 G TOTAL) BY MOUTH 2 (TWO) TIMES DAILY as needed) 420 mL 1  . traMADol (ULTRAM) 50 MG tablet Take 50 mg by mouth as needed for pain.    Marland Kitchen zoledronic acid (RECLAST) 5 MG/100ML SOLN injection Reclast     No facility-administered medications prior to visit.    Allergies  Allergen Reactions  . Actos [Pioglitazone] Swelling    Swelling  . Sulfonamide Derivatives Swelling  . Adhesive [Tape] Other (See Comments)    WITH PROLONGED EXPOSURE (REDNESS/ITCHING)  . Codeine Nausea And Vomiting    ROS Review of Systems  Constitutional: Negative for appetite change, chills, fever and unexpected weight change.  Respiratory: Negative for cough and shortness of breath.   Cardiovascular: Negative for chest pain and leg swelling.  Gastrointestinal: Negative for abdominal pain.  Genitourinary: Positive for flank pain. Negative for dysuria and hematuria.  Musculoskeletal: Positive for back pain.  Skin: Negative for rash.      Objective:    Physical Exam Vitals reviewed.  Cardiovascular:     Rate and Rhythm: Normal rate.  Pulmonary:     Effort: Pulmonary effort is normal.     Breath sounds: Normal breath sounds.  Musculoskeletal:     Cervical back: Neck supple.     Right lower leg: No edema.     Left lower leg: No edema.     Comments: Right hip reveals good range of motion.  She has no localized tenderness over the greater trochanteric bursa.  She does have some tenderness over the proximal iliotibial band.  No spinal tenderness  Neurological:     Mental Status: She is  alert.     BP 120/68 (BP Location: Left Arm, Patient Position: Sitting, Cuff Size: Normal)   Pulse 81   Temp 97.7 F (36.5 C) (Oral)   Wt 95 lb 9.6 oz (43.4 kg)   SpO2 98%   BMI 16.41 kg/m  Wt Readings from Last 3 Encounters:  12/05/19 95 lb 9.6 oz (43.4 kg)  10/27/19 95 lb (43.1 kg)  08/23/19 99 lb 9.6 oz  (45.2 kg)     Health Maintenance Due  Topic Date Due  . INFLUENZA VACCINE  11/27/2019    There are no preventive care reminders to display for this patient.  Lab Results  Component Value Date   TSH 4.390 04/05/2018   Lab Results  Component Value Date   WBC 7.7 10/27/2019   HGB 13.2 10/27/2019   HCT 39.2 10/27/2019   MCV 84.9 10/27/2019   PLT 159.0 10/27/2019   Lab Results  Component Value Date   NA 140 10/27/2019   K 3.7 10/27/2019   CO2 30 10/27/2019   GLUCOSE 93 10/27/2019   BUN 19 10/27/2019   CREATININE 0.85 10/27/2019   BILITOT 0.6 10/27/2019   ALKPHOS 60 10/27/2019   AST 24 10/27/2019   ALT 14 10/27/2019   PROT 7.4 10/27/2019   ALBUMIN 4.2 10/27/2019   CALCIUM 9.4 10/27/2019   ANIONGAP 10 09/22/2019   GFR 63.57 10/27/2019   Lab Results  Component Value Date   CHOL 135 11/18/2016   Lab Results  Component Value Date   HDL 69.20 11/22/2014   Lab Results  Component Value Date   LDLCALC 66 11/22/2014   Lab Results  Component Value Date   TRIG 54 11/18/2016   Lab Results  Component Value Date   CHOLHDL 2 11/22/2014   Lab Results  Component Value Date   HGBA1C 5.8 11/18/2016      Assessment & Plan:   Patient relates 1 week history of very poorly localized pain including lumbar area but also right thoracic and right lateral hip.  Suspect she does have some element of iliotibial band inflammation.  No evidence for hip bursitis. Suspect some of her pain may be radiating from her low back issue  -Check urine dipstick-does show nitrites and trace blood but no leukocytes. Culture sent. We discussed empiric coverage with Keflex 500 mg 3 times daily pending culture results  -Consider trial over-the-counter diclofenac gel to her right lateral hip pain  -Would have low threshold to get back into neurosurgeon if symptoms not improving with the above  No orders of the defined types were placed in this encounter.   Follow-up: No follow-ups on file.     Carolann Littler, MD

## 2019-12-05 NOTE — Patient Instructions (Signed)
Start the antibiotic for the urine  Consider over the counter Diclofenac gel to lateral hip region.

## 2019-12-08 LAB — URINE CULTURE
MICRO NUMBER:: 10803267
SPECIMEN QUALITY:: ADEQUATE

## 2019-12-20 ENCOUNTER — Other Ambulatory Visit: Payer: Medicare Other

## 2019-12-20 ENCOUNTER — Telehealth: Payer: Self-pay | Admitting: Nurse Practitioner

## 2019-12-20 DIAGNOSIS — R197 Diarrhea, unspecified: Secondary | ICD-10-CM

## 2019-12-20 DIAGNOSIS — R634 Abnormal weight loss: Secondary | ICD-10-CM

## 2019-12-20 NOTE — Telephone Encounter (Signed)
Patient contacted. She did not know she was to collect 2 specimens. She thanks me for the call. She will collect the second specimen and submit it to the lab.

## 2019-12-20 NOTE — Telephone Encounter (Signed)
Pt is requesting a call back from a nurse, pt was informed by the lab that the orders were not complete.

## 2019-12-22 ENCOUNTER — Ambulatory Visit: Payer: Medicare Other

## 2019-12-22 LAB — GI PROFILE, STOOL, PCR

## 2019-12-23 ENCOUNTER — Other Ambulatory Visit: Payer: Self-pay

## 2019-12-23 ENCOUNTER — Other Ambulatory Visit: Payer: Medicare Other

## 2019-12-23 DIAGNOSIS — R634 Abnormal weight loss: Secondary | ICD-10-CM

## 2019-12-23 DIAGNOSIS — R197 Diarrhea, unspecified: Secondary | ICD-10-CM

## 2019-12-26 DIAGNOSIS — Z1231 Encounter for screening mammogram for malignant neoplasm of breast: Secondary | ICD-10-CM | POA: Diagnosis not present

## 2019-12-26 LAB — HM MAMMOGRAPHY

## 2019-12-28 ENCOUNTER — Ambulatory Visit (INDEPENDENT_AMBULATORY_CARE_PROVIDER_SITE_OTHER): Payer: Medicare Other

## 2019-12-28 ENCOUNTER — Other Ambulatory Visit: Payer: Self-pay

## 2019-12-28 DIAGNOSIS — E538 Deficiency of other specified B group vitamins: Secondary | ICD-10-CM | POA: Diagnosis not present

## 2019-12-28 MED ORDER — CYANOCOBALAMIN 1000 MCG/ML IJ SOLN
1000.0000 ug | Freq: Once | INTRAMUSCULAR | Status: AC
  Administered 2019-12-28: 1000 ug via INTRAMUSCULAR

## 2019-12-28 NOTE — Patient Instructions (Signed)
Health Maintenance Due  Topic Date Due  . INFLUENZA VACCINE  11/27/2019  . MAMMOGRAM  12/23/2019    Depression screen [facility] 2/9 06/22/2019 12/14/2017 12/09/2016  Decreased Interest 0 1 0  Down, Depressed, Hopeless 0 0 0  PHQ - 2 Score 0 1 0  Altered sleeping 1 - -  Tired, decreased energy 1 - -  Change in appetite 0 - -  Feeling bad or failure about yourself  0 - -  Trouble concentrating 0 - -  Moving slowly or fidgety/restless 0 - -  Suicidal thoughts 0 - -  PHQ-9 Score 2 - -  Difficult doing work/chores Not difficult at all - -  Some recent data might be hidden

## 2019-12-29 LAB — PANCREATIC ELASTASE, FECAL: Pancreatic Elastase-1, Stool: >500 mcg/g

## 2019-12-30 ENCOUNTER — Telehealth: Payer: Self-pay | Admitting: General Surgery

## 2019-12-30 NOTE — Telephone Encounter (Signed)
-----   Message from Noralyn Pick, NP sent at 12/30/2019  5:53 AM EDT ----- Olivia Mackie, pls contact patient and let her know her pancreatic elastase level was normal (no evidence of pancreatic insufficiency). THX

## 2019-12-30 NOTE — Telephone Encounter (Signed)
Spoke with the patient and advised her that her pancreatic elastace was normal, indicating no pancreatic insufficiency. The patient verbalized understanding.

## 2020-01-03 ENCOUNTER — Telehealth: Payer: Self-pay

## 2020-01-03 DIAGNOSIS — I10 Essential (primary) hypertension: Secondary | ICD-10-CM

## 2020-01-03 DIAGNOSIS — E785 Hyperlipidemia, unspecified: Secondary | ICD-10-CM

## 2020-01-03 NOTE — Progress Notes (Addendum)
Chronic Care Management Pharmacy Assistant   Name: [name]  MRN: [id number] DOB: [birth year]  Reason for Encounter:Hypertension  Disease State call   PCP : [name], MD  Allergies:   Allergies  Allergen Reactions  . Actos [Pioglitazone] Swelling    Swelling  . Sulfonamide Derivatives Swelling  . Adhesive [Tape] Other (See Comments)    WITH PROLONGED EXPOSURE (REDNESS/ITCHING)  . Codeine Nausea And Vomiting    Medications: Outpatient Encounter Medications as of 01/03/2020  Medication Sig Note  . acetaminophen (TYLENOL) 500 MG tablet Take 500 mg by mouth every 6 (six) hours as needed (pain).   Marland Kitchen atorvastatin (LIPITOR) 80 MG tablet TAKE 1 TABLET BY MOUTH DAILY AT BEDTIME   . Calcium Carbonate-Vitamin D (CALTRATE 600+D PO) Take 1 tablet by mouth daily with lunch.    . carvedilol (COREG) 6.25 MG tablet Take 1 tablet (6.25 mg total) by mouth 2 (two) times daily.   . cephALEXin (KEFLEX) 500 MG capsule Take 1 capsule (500 mg total) by mouth 3 (three) times daily.   . cyanocobalamin (,VITAMIN B-12,) 1000 MCG/ML injection Inject 1,000 mcg into the muscle every 30 (thirty) days.   Marland Kitchen ELIQUIS 2.5 MG TABS tablet TAKE 1 TABLET BY MOUTH TWICE DAILY   . Ferrous Sulfate (IRON) 28 MG TABS Take 1 tablet by mouth daily.    . fluticasone (FLONASE) 50 MCG/ACT nasal spray Place 1 spray daily as needed into both nostrils for allergies or rhinitis.   Marland Kitchen HM LIDOCAINE PATCH EX Place 1 patch onto the skin daily as needed (pain.).   Marland Kitchen hyoscyamine (LEVSIN SL) 0.125 MG SL tablet Place 1 tablet (0.125 mg total) under the tongue every 6 (six) hours as needed.   Marland Kitchen ipratropium (ATROVENT) 0.03 % nasal spray Place 1 spray into both nostrils 2 (two) times daily.    Marland Kitchen lactase (LACTAID) 3000 units tablet Take 3,000 Units by mouth 3 (three) times daily with meals as needed.   Marland Kitchen levothyroxine (SYNTHROID, LEVOTHROID) 50 MCG tablet Take 50 mcg by mouth every evening.    Marland Kitchen LIDOCAINE EX Apply 1 application  topically 3 (three) times daily as needed (pain). Roll-On (otc)   . loratadine (CLARITIN) 10 MG tablet Take 10 mg by mouth daily as needed for allergies.   Marland Kitchen losartan (COZAAR) 100 MG tablet TAKE 1 TABLET BY MOUTH DAILY   . Multiple Vitamins-Minerals (PRESERVISION AREDS 2 PO) Take 1 tablet by mouth 2 (two) times daily.   Vladimir Faster Glycol-Propyl Glycol (SYSTANE FREE OP) Place 1 drop into both eyes 3 (three) times daily as needed (dry/irritated eyes.).   Marland Kitchen saccharomyces boulardii (FLORASTOR) 250 MG capsule Take 250 mg by mouth 2 (two) times daily.   . sodium chloride (OCEAN) 0.65 % nasal spray Place 1 spray into both nostrils 3 (three) times daily as needed for congestion.    Marland Kitchen spironolactone (ALDACTONE) 25 MG tablet TAKE 1 TABLET BY MOUTH ONCE DAILY   . sucralfate (CARAFATE) 1 GM/10ML suspension TAKE 10 MLS (1 G TOTAL) BY MOUTH 2 (TWO) TIMES DAILY. (Patient taking differently: TAKE 10 MLS (1 G TOTAL) BY MOUTH 2 (TWO) TIMES DAILY as needed)   . traMADol (ULTRAM) 50 MG tablet Take 50 mg by mouth as needed for pain.   Marland Kitchen zoledronic acid (RECLAST) 5 MG/100ML SOLN injection Reclast 03/30/2019: YEARLY   No facility-administered encounter medications on file as of 01/03/2020.    Current Diagnosis: Patient Active Problem List   Diagnosis Date Noted  . Diarrhea  10/27/2019  . Gastric polyp   . Lymphocytic colitis 09/15/2018  . B12 deficiency 06/29/2018  . Persistent atrial fibrillation ([place]) 04/05/2018  . Loose stools 07/06/2017  . Rectal bleeding 07/06/2017  . Generalized abdominal pain 07/06/2017  . Cervical spondylosis 03/11/2017  . Loss of weight 11/18/2013  . Nausea alone 11/18/2013  . Abdominal pain, epigastric 11/18/2013  . Anemia, iron deficiency 01/27/2013  . Temporomandibular joint-pain-dysfunction syndrome (TMJ) 06/28/2012  . Other abnormal glucose 08/25/2011  . Allergic rhinitis, cause unspecified 08/25/2011  . Right-sided chest wall pain 08/25/2011  . Murmur 09/20/2010  .  Incisional hernia 09/20/2010  . HERNIATED DISC 01/02/2010  . CAROTID ARTERY DISEASE 08/27/2009  . Osteoporosis 06/07/2009  . BRADYCARDIA 02/05/2009  . BENIGN POSITIONAL VERTIGO 09/27/2008  . CELIAC ARTERY COMPRESSION SYNDROME 10/25/2007  . HYPOTHYROIDISM 01/08/2007  . Hyperlipidemia 01/08/2007  . Essential hypertension 01/08/2007  . Coronary atherosclerosis 01/08/2007  . PERIPHERAL VASCULAR DISEASE 01/08/2007  . ATROPHIC GASTRITIS 01/08/2007  . ANEMIA, PERNICIOUS, HX OF 01/08/2007  . RENAL CALCULUS, HX OF 01/08/2007  . HYSTERECTOMY, HX OF 01/08/2007  . APPENDECTOMY, HX OF 01/08/2007     Follow-Up:  Pharmacist Review   Reviewed chart prior to disease state call. Spoke with patient regarding BP  Recent Office Vitals: BP Readings from Last 3 Encounters:  12/05/19 120/68  10/27/19 (!) 150/70  09/22/19 125/63   Pulse Readings from Last 3 Encounters:  12/05/19 81  10/27/19 62  09/22/19 80    Wt Readings from Last 3 Encounters:  12/05/19 95 lb 9.6 oz (43.4 kg)  10/27/19 95 lb (43.1 kg)  08/23/19 99 lb 9.6 oz (45.2 kg)     Kidney Function Lab Results  Component Value Date/Time   CREATININE 0.85 10/27/2019 03:22 PM   CREATININE 1.04 (H) 09/22/2019 02:15 PM   GFR 63.57 10/27/2019 03:22 PM   GFRNONAA 49 (L) 09/22/2019 02:15 PM   GFRAA 57 (L) 09/22/2019 02:15 PM    BMP Latest Ref Rng & Units 10/27/2019 09/22/2019 07/25/2019  Glucose 70 - 99 mg/dL 93 107(H) 96  BUN 6 - 23 mg/dL 19 21 19   Creatinine 0.40 - 1.20 mg/dL 0.85 1.04(H) 0.97  BUN/Creat Ratio 12 - 28 - - -  Sodium 135 - 145 mEq/L 140 140 143  Potassium 3.5 - 5.1 mEq/L 3.7 3.7 3.7  Chloride 96 - 112 mEq/L 103 104 106  CO2 19 - 32 mEq/L 30 26 26   Calcium 8.4 - 10.5 mg/dL 9.4 8.8(L) 8.7(L)    . Current antihypertensive regimen:   Carvedilol 6.25 mg twice daily   Losartan 100 mg daily  Spirinolactone 25 mg daily . How often are you checking your Blood Pressure? Patient States she is not checking her blood  pressure at home . Current home BP readings: None ID . What recent interventions/DTPs have been made by any provider to improve Blood Pressure control since last CPP Visit: None ID . Any recent hospitalizations or ED visits since last visit with CPP? No . What diet changes have been made to improve Blood Pressure Control?  o Patient states she cooks at home and eat out at restaurants from time to time.  . What exercise is being done to improve your Blood Pressure Control?  o Patient denies exercising at this time.  Adherence Review: Is the patient currently on ACE/ARB medication? Yes Does the patient have >5 day gap between last estimated fill dates? Yes   [place] Pharmacist Assistant [phone]

## 2020-01-05 NOTE — Progress Notes (Signed)
Per orders of [name], MD, injection of B12 given in  Right  deltoid by Franco Collet. Patient tolerated injection well.  Lab Results  Component Value Date   VITAMINB12 >1500 (H) 05/23/2019

## 2020-01-09 ENCOUNTER — Ambulatory Visit (INDEPENDENT_AMBULATORY_CARE_PROVIDER_SITE_OTHER): Payer: Medicare Other | Admitting: Family Medicine

## 2020-01-09 ENCOUNTER — Other Ambulatory Visit: Payer: Self-pay

## 2020-01-09 ENCOUNTER — Encounter: Payer: Self-pay | Admitting: Family Medicine

## 2020-01-09 VITALS — BP 102/62 | HR 64 | Temp 98.4°F | Ht 64.0 in | Wt 93.0 lb

## 2020-01-09 DIAGNOSIS — M545 Low back pain, unspecified: Secondary | ICD-10-CM

## 2020-01-09 DIAGNOSIS — G8929 Other chronic pain: Secondary | ICD-10-CM | POA: Diagnosis not present

## 2020-01-09 DIAGNOSIS — R29898 Other symptoms and signs involving the musculoskeletal system: Secondary | ICD-10-CM

## 2020-01-09 MED ORDER — PREDNISONE 10 MG PO TABS: ORAL_TABLET | ORAL | 0 refills | Status: DC

## 2020-01-09 NOTE — Patient Instructions (Signed)
I will set up referral back to Dr Trenton Gammon.  Go ahead and start the prednisone  Continue with Tylenol, ice, and muscle massage.

## 2020-01-09 NOTE — Progress Notes (Signed)
Established Patient Office Visit  Subjective:  Patient ID: [name], female    DOB: [birth year]  Age: 86 y.o. MRN: [id number]  CC:  Chief Complaint  Patient presents with  . Back Pain    HPI [name] presents for acute on chronic low back pain and some continued weight loss and poor appetite.  She recently saw GI.  She has several tests done including screens for celiac disease, GI PCR profile, and pancreatic elastase and these were all normal.  She has tried some high-calorie supplement shakes but has been unable to tolerate frequently.  Her major complaint now is low back pain which is somewhat diffuse and bilateral.  She has had chronic pain in the past and has seen neurosurgeon and had physical therapy and epidurals without much success.  She states she had a "ruptured disc "in the past.  She has known degenerative changes lumbar spine as well.  She has fairly severe low back pain with worsening last week.  Denies any fall or injury.  She tried some ice and muscle massage with some relief.  She is tried Tylenol with minimal relief.  Previously took tramadol without much relief.  No dysuria.  No fevers or chills.  Occasional radiation down the right lower extremity to the upper thigh.  She has seen neurosurgeon Dr. Trenton Gammon in the past.  She states she had a MRI scan done right before the pandemic.  She is not aware of any major imaging since then.  There apparently been some discussion of surgery at 1 point but patient was obviously wishing to look to that as a last resort  Past Medical History:  Diagnosis Date  . Allergy   . Anemia, pernicious   . Arthritis    bursitis   . Atrophic gastritis   . B12 deficiency   . CAD (coronary artery disease)   . Carotid artery stenosis   . Cataract   . Diarrhea    on and off for months  . Family history of adverse reaction to anesthesia    extreme nausea brother and sister  . Fibromyalgia   . Gallbladder polyp   . GERD  (gastroesophageal reflux disease)   . Heart murmur   . History of hiatal hernia   . History of kidney stones   . HLD (hyperlipidemia)   . HTN (hypertension)   . Hypothyroidism   . Irritable bowel syndrome (IBS)   . Median arcuate ligament syndrome ([place])   . Osteoporosis   . PVD (peripheral vascular disease) ([place])   . Tubular adenoma of colon 08/2013   with focal high grade dysplasia    Past Surgical History:  Procedure Laterality Date  . ABDOMINAL HYSTERECTOMY    . APPENDECTOMY    . BIOPSY THYROID    . cataract surgery    . CHOLECYSTECTOMY N/A 10/24/2014   Procedure: MICROSCOPIC  CHOLECYSTECTOMY;  Surgeon: Ralene Ok, MD;  Location: [facility];  Service: General;  Laterality: N/A;  . COLONOSCOPY    . CORONARY ARTERY BYPASS GRAFT  07  . ESOPHAGEAL MANOMETRY N/A 08/02/2012   Procedure: ESOPHAGEAL MANOMETRY (EM);  Surgeon: Sable Feil, MD;  Location: [facility];  Service: Endoscopy;  Laterality: N/A;  . ESOPHAGOGASTRODUODENOSCOPY (EGD) WITH PROPOFOL N/A 04/07/2019   Procedure: ESOPHAGOGASTRODUODENOSCOPY (EGD) WITH PROPOFOL;  Surgeon: Milus Banister, MD;  Location: [facility];  Service: Endoscopy;  Laterality: N/A;  . HEMOSTASIS CLIP PLACEMENT  04/07/2019   Procedure: HEMOSTASIS CLIP PLACEMENT;  Surgeon: Ardis Hughs,  Melene Plan, MD;  Location: [facility];  Service: Endoscopy;;  . HOT HEMOSTASIS N/A 04/07/2019   Procedure: HOT HEMOSTASIS (ARGON PLASMA COAGULATION/BICAP);  Surgeon: Milus Banister, MD;  Location: [facility];  Service: Endoscopy;  Laterality: N/A;  . POLYPECTOMY  04/07/2019   Procedure: POLYPECTOMY;  Surgeon: Milus Banister, MD;  Location: [facility];  Service: Endoscopy;;  . [name]  04/07/2019   Procedure: [name];  Surgeon: Milus Banister, MD;  Location: [facility];  Service: Endoscopy;;  . THYROID LOBECTOMY    . UPPER GASTROINTESTINAL ENDOSCOPY      Family History  Problem Relation Age of Onset  . Heart disease Mother   .  Cholelithiasis Mother   . Prostate cancer Brother   . Colon cancer Brother   . Breast cancer Other        niece - breast cancer  . Diabetes Sister   . Lung cancer Father   . Lung cancer Sister   . Stomach cancer Cousin   . Esophageal cancer Neg Hx   . Rectal cancer Neg Hx     Social History   Socioeconomic History  . Marital status: Married    Spouse name: [name]  . Number of children: 0  . Years of education: 82  . Highest education level: Not on file  Occupational History  . Occupation: retired    Fish farm manager: RETIRED  Tobacco Use  . Smoking status: Never Smoker  . Smokeless tobacco: Never Used  Vaping Use  . Vaping Use: Never used  Substance and Sexual Activity  . Alcohol use: No    Alcohol/week: 0.0 standard drinks  . Drug use: No  . Sexual activity: Not on file  Other Topics Concern  . Not on file  Social History Narrative   [name], Married. Lives with husband. Had been a very active woman.   Social Determinants of Health   Financial Resource Strain: Low Risk   . Difficulty of Paying Living Expenses: Not hard at all  Food Insecurity:   . Worried About Charity fundraiser in the Last Year: Not on file  . Ran Out of Food in the Last Year: Not on file  Transportation Needs: No Transportation Needs  . Lack of Transportation (Medical): No  . Lack of Transportation (Non-Medical): No  Physical Activity:   . Days of Exercise per Week: Not on file  . Minutes of Exercise per Session: Not on file  Stress:   . Feeling of Stress : Not on file  Social Connections:   . Frequency of Communication with Friends and Family: Not on file  . Frequency of Social Gatherings with Friends and Family: Not on file  . Attends Religious Services: Not on file  . Active Member of Clubs or Organizations: Not on file  . Attends Archivist Meetings: Not on file  . Marital Status: Not on file  Intimate Partner Violence:   . Fear of Current or Ex-Partner: Not on file  . Emotionally  Abused: Not on file  . Physically Abused: Not on file  . Sexually Abused: Not on file    Outpatient Medications Prior to Visit  Medication Sig Dispense Refill  . acetaminophen (TYLENOL) 500 MG tablet Take 500 mg by mouth every 6 (six) hours as needed (pain).    Marland Kitchen atorvastatin (LIPITOR) 80 MG tablet TAKE 1 TABLET BY MOUTH DAILY AT BEDTIME 90 tablet 0  . Calcium Carbonate-Vitamin D (CALTRATE 600+D PO) Take 1 tablet by mouth daily with lunch.     Marland Kitchen  carvedilol (COREG) 6.25 MG tablet Take 1 tablet (6.25 mg total) by mouth 2 (two) times daily. 180 tablet 3  . cephALEXin (KEFLEX) 500 MG capsule Take 1 capsule (500 mg total) by mouth 3 (three) times daily. 21 capsule 0  . cyanocobalamin (,VITAMIN B-12,) 1000 MCG/ML injection Inject 1,000 mcg into the muscle every 30 (thirty) days.    Marland Kitchen ELIQUIS 2.5 MG TABS tablet TAKE 1 TABLET BY MOUTH TWICE DAILY 180 tablet 3  . Ferrous Sulfate (IRON) 28 MG TABS Take 1 tablet by mouth daily.     . fluticasone (FLONASE) 50 MCG/ACT nasal spray Place 1 spray daily as needed into both nostrils for allergies or rhinitis.    Marland Kitchen HM LIDOCAINE PATCH EX Place 1 patch onto the skin daily as needed (pain.).    Marland Kitchen hyoscyamine (LEVSIN SL) 0.125 MG SL tablet Place 1 tablet (0.125 mg total) under the tongue every 6 (six) hours as needed. 30 tablet 2  . ipratropium (ATROVENT) 0.03 % nasal spray Place 1 spray into both nostrils 2 (two) times daily.     Marland Kitchen lactase (LACTAID) 3000 units tablet Take 3,000 Units by mouth 3 (three) times daily with meals as needed.    Marland Kitchen levothyroxine (SYNTHROID, LEVOTHROID) 50 MCG tablet Take 50 mcg by mouth every evening.     Marland Kitchen LIDOCAINE EX Apply 1 application topically 3 (three) times daily as needed (pain). Roll-On (otc)    . loratadine (CLARITIN) 10 MG tablet Take 10 mg by mouth daily as needed for allergies.    Marland Kitchen losartan (COZAAR) 100 MG tablet TAKE 1 TABLET BY MOUTH DAILY 90 tablet 0  . Multiple Vitamins-Minerals (PRESERVISION AREDS 2 PO) Take 1 tablet  by mouth 2 (two) times daily.    Vladimir Faster Glycol-Propyl Glycol (SYSTANE FREE OP) Place 1 drop into both eyes 3 (three) times daily as needed (dry/irritated eyes.).    Marland Kitchen saccharomyces boulardii (FLORASTOR) 250 MG capsule Take 250 mg by mouth 2 (two) times daily.    . sodium chloride (OCEAN) 0.65 % nasal spray Place 1 spray into both nostrils 3 (three) times daily as needed for congestion.     Marland Kitchen spironolactone (ALDACTONE) 25 MG tablet TAKE 1 TABLET BY MOUTH ONCE DAILY 90 tablet 2  . sucralfate (CARAFATE) 1 GM/10ML suspension TAKE 10 MLS (1 G TOTAL) BY MOUTH 2 (TWO) TIMES DAILY. (Patient taking differently: TAKE 10 MLS (1 G TOTAL) BY MOUTH 2 (TWO) TIMES DAILY as needed) 420 mL 1  . traMADol (ULTRAM) 50 MG tablet Take 50 mg by mouth as needed for pain.    Marland Kitchen zoledronic acid (RECLAST) 5 MG/100ML SOLN injection Reclast     No facility-administered medications prior to visit.    Allergies  Allergen Reactions  . Actos [Pioglitazone] Swelling    Swelling  . Sulfonamide Derivatives Swelling  . Adhesive [Tape] Other (See Comments)    WITH PROLONGED EXPOSURE (REDNESS/ITCHING)  . Codeine Nausea And Vomiting    ROS Review of Systems  Constitutional: Negative for chills and fever.  HENT: Negative for trouble swallowing.   Respiratory: Negative for cough and shortness of breath.   Cardiovascular: Negative for chest pain.  Genitourinary: Negative for dysuria.  Musculoskeletal: Positive for back pain.      Objective:    Physical Exam Vitals reviewed.  Cardiovascular:     Rate and Rhythm: Normal rate and regular rhythm.  Pulmonary:     Effort: Pulmonary effort is normal.     Breath sounds: Normal breath sounds.  Musculoskeletal:  Cervical back: Neck supple.     Comments: Straight leg raise are negative bilaterally.  No spinal tenderness.  Neurological:     Comments: 2+ reflexes knee.  Trace to 1+ ankle bilaterally.  She has weakness with knee extension bilaterally.  Some weakness with  plantarflexion and dorsiflexion bilaterally     BP 102/62   Pulse 64   Temp 98.4 F (36.9 C) (Other (Comment))   Ht 5\' 4"  (1.626 m)   Wt 93 lb (42.2 kg)   SpO2 98%   BMI 15.96 kg/m  Wt Readings from Last 3 Encounters:  01/09/20 93 lb (42.2 kg)  12/05/19 95 lb 9.6 oz (43.4 kg)  10/27/19 95 lb (43.1 kg)     Health Maintenance Due  Topic Date Due  . INFLUENZA VACCINE  11/27/2019  . MAMMOGRAM  12/23/2019    There are no preventive care reminders to display for this patient.  Lab Results  Component Value Date   TSH 4.390 04/05/2018   Lab Results  Component Value Date   WBC 7.7 10/27/2019   HGB 13.2 10/27/2019   HCT 39.2 10/27/2019   MCV 84.9 10/27/2019   PLT 159.0 10/27/2019   Lab Results  Component Value Date   NA 140 10/27/2019   K 3.7 10/27/2019   CO2 30 10/27/2019   GLUCOSE 93 10/27/2019   BUN 19 10/27/2019   CREATININE 0.85 10/27/2019   BILITOT 0.6 10/27/2019   ALKPHOS 60 10/27/2019   AST 24 10/27/2019   ALT 14 10/27/2019   PROT 7.4 10/27/2019   ALBUMIN 4.2 10/27/2019   CALCIUM 9.4 10/27/2019   ANIONGAP 10 09/22/2019   GFR 63.57 10/27/2019   Lab Results  Component Value Date   CHOL 135 11/18/2016   Lab Results  Component Value Date   HDL 69.20 11/22/2014   Lab Results  Component Value Date   LDLCALC 66 11/22/2014   Lab Results  Component Value Date   TRIG 54 11/18/2016   Lab Results  Component Value Date   CHOLHDL 2 11/22/2014   Lab Results  Component Value Date   HGBA1C 5.8 11/18/2016      Assessment & Plan:   #1 acute on chronic lower lumbar back pain.  She has some known degenerative changes and also reported history of disc herniation.  This seems to be more of an exacerbation of her chronic pain.  She denies any recent injury.  She has some generalized weakness in her lower extremities but no focal abnormalities.  -We recommend a trial of prednisone taper with caution for potential side effects. -Set up referral back to Dr.  Trenton Gammon her neurosurgeon -We also discussed the fact that she likely will need some recurrent physical therapy will but will defer to Dr. Trenton Gammon at this point  #2 weight loss.  Her weight is actually fairly stable at this time but she has very low BMI.  She has struggled to use a lot of supplements and seems to be very limited in what she can eat because of her chronic GI issues.  She is currently using some Lactaid but still has difficulty digesting several foods.  Recent GI studies as above unremarkable.  She has had multiple screening labs with chemistries and CBC have been unremarkable recently  Meds ordered this encounter  Medications  . predniSONE (DELTASONE) 10 MG tablet    Sig: Taper as follows: 4-4-4-3-3-2-2-1-1    Dispense:  24 tablet    Refill:  0    Follow-up: No follow-ups  on file.    [name], MD 

## 2020-01-11 ENCOUNTER — Encounter: Payer: Self-pay | Admitting: Family Medicine

## 2020-01-12 DIAGNOSIS — M5416 Radiculopathy, lumbar region: Secondary | ICD-10-CM | POA: Diagnosis not present

## 2020-01-12 DIAGNOSIS — Z681 Body mass index (BMI) 19 or less, adult: Secondary | ICD-10-CM | POA: Diagnosis not present

## 2020-01-12 DIAGNOSIS — I10 Essential (primary) hypertension: Secondary | ICD-10-CM | POA: Diagnosis not present

## 2020-01-24 ENCOUNTER — Encounter: Payer: Self-pay | Admitting: Family Medicine

## 2020-01-24 ENCOUNTER — Other Ambulatory Visit: Payer: Self-pay

## 2020-01-24 ENCOUNTER — Telehealth (INDEPENDENT_AMBULATORY_CARE_PROVIDER_SITE_OTHER): Payer: Medicare Other | Admitting: Family Medicine

## 2020-01-24 VITALS — Ht 64.0 in | Wt 93.0 lb

## 2020-01-24 DIAGNOSIS — M5416 Radiculopathy, lumbar region: Secondary | ICD-10-CM | POA: Diagnosis not present

## 2020-01-24 MED ORDER — GABAPENTIN 100 MG PO CAPS: 100.0000 mg | ORAL_CAPSULE | Freq: Two times a day (BID) | ORAL | 1 refills | Status: AC

## 2020-01-24 NOTE — Progress Notes (Signed)
Patient ID: [name], female   DOB: [birth year], 86 y.o.   MRN: [id number]   This visit type was conducted due to national recommendations for restrictions regarding the COVID-19 pandemic in an effort to limit this patient's exposure and mitigate transmission in our community.   Virtual Visit via Telephone Note  I connected with [name] on 01/24/20 at  3:45 PM EDT by telephone and verified that I am speaking with the correct person using two identifiers.   I discussed the limitations, risks, security and privacy concerns of performing an evaluation and management service by telephone and the availability of in person appointments. I also discussed with the patient that there may be a patient responsible charge related to this service. The patient expressed understanding and agreed to proceed.  Location patient: home Location provider: work or home office Participants present for the call: patient, provider Patient did not have a visit in the prior 7 days to address this/these issue(s).   History of Present Illness:  [name] has been battling with some acute on chronic low back pain.  She actually saw neurosurgery assistant 2 weeks ago.  She has scheduled MRI this Thursday.  Refer to our recent notes for details.  She was recently placed on prednisone which helped temporarily.  She has tried Tylenol and tramadol without relief.  Her pain is right lower lumbar area and she does have radiculitis symptoms down the right lower extremity.  No urine or stool incontinence.  Pain is moderate to severe at times.  Interfering with sleep.  She has had prior side effects with codeine.  Continues to have tingling sensation right lower extremity.  No fever.  She states her neurosurgeon had suggested possible surgical options just prior to the pandemic.  For some time her symptoms seem to settle down.  She denies any recent injury.  Past Medical History:  Diagnosis Date   Allergy     Anemia, pernicious    Arthritis    bursitis    Atrophic gastritis    B12 deficiency    CAD (coronary artery disease)    Carotid artery stenosis    Cataract    Diarrhea    on and off for months   Family history of adverse reaction to anesthesia    extreme nausea brother and sister   Fibromyalgia    Gallbladder polyp    GERD (gastroesophageal reflux disease)    Heart murmur    History of hiatal hernia    History of kidney stones    HLD (hyperlipidemia)    HTN (hypertension)    Hypothyroidism    Irritable bowel syndrome (IBS)    Median arcuate ligament syndrome (HCC)    Osteoporosis    PVD (peripheral vascular disease) ([place])    Tubular adenoma of colon 08/2013   with focal high grade dysplasia   Past Surgical History:  Procedure Laterality Date   ABDOMINAL HYSTERECTOMY     APPENDECTOMY     BIOPSY THYROID     cataract surgery     CHOLECYSTECTOMY N/A 10/24/2014   Procedure: MICROSCOPIC  CHOLECYSTECTOMY;  Surgeon: Ralene Ok, MD;  Location: [facility];  Service: General;  Laterality: N/A;   COLONOSCOPY     CORONARY ARTERY BYPASS GRAFT  07   ESOPHAGEAL MANOMETRY N/A 08/02/2012   Procedure: ESOPHAGEAL MANOMETRY (EM);  Surgeon: Sable Feil, MD;  Location: [facility];  Service: Endoscopy;  Laterality: N/A;   ESOPHAGOGASTRODUODENOSCOPY (EGD) WITH PROPOFOL N/A 04/07/2019   Procedure: ESOPHAGOGASTRODUODENOSCOPY (  EGD) WITH PROPOFOL;  Surgeon: Milus Banister, MD;  Location: [facility];  Service: Endoscopy;  Laterality: N/A;   HEMOSTASIS CLIP PLACEMENT  04/07/2019   Procedure: HEMOSTASIS CLIP PLACEMENT;  Surgeon: Milus Banister, MD;  Location: [facility];  Service: Endoscopy;;   HOT HEMOSTASIS N/A 04/07/2019   Procedure: HOT HEMOSTASIS (ARGON PLASMA COAGULATION/BICAP);  Surgeon: Milus Banister, MD;  Location: [facility];  Service: Endoscopy;  Laterality: N/A;   POLYPECTOMY  04/07/2019   Procedure: POLYPECTOMY;  Surgeon: Milus Banister, MD;  Location: [facility];  Service: Endoscopy;;   SCLEROTHERAPY  04/07/2019   Procedure: [name];  Surgeon: Milus Banister, MD;  Location: [facility];  Service: Endoscopy;;   THYROID LOBECTOMY     UPPER GASTROINTESTINAL ENDOSCOPY      reports that she has never smoked. She has never used smokeless tobacco. She reports that she does not drink alcohol and does not use drugs. family history includes Breast cancer in an other family member; Cholelithiasis in her mother; Colon cancer in her brother; Diabetes in her sister; Heart disease in her mother; Lung cancer in her father and sister; Prostate cancer in her brother; Stomach cancer in her cousin. Allergies  Allergen Reactions   Actos [Pioglitazone] Swelling    Swelling   Sulfonamide Derivatives Swelling   Adhesive [Tape] Other (See Comments)    WITH PROLONGED EXPOSURE (REDNESS/ITCHING)   Codeine Nausea And Vomiting      Observations/Objective: Patient sounds cheerful and well on the phone. I do not appreciate any SOB. Speech and thought processing are grossly intact. Patient reported vitals:  Assessment and Plan:  Progressive right lumbar back pain with right lumbar radiculopathy symptoms.  Pain uncontrolled with Tylenol and tramadol.  -Keep close follow-up with neurosurgery -We discussed trial of low-dose gabapentin 100 mg twice daily.  We explained that this could cause side effects such as sedation and dizziness.  She will use cautiously. -Watch closely for any progressive neurologic symptoms such as progressive weakness, numbness, or incontinence symptoms  Follow Up Instructions:  -She has scheduled MRI this Thursday and scheduled follow-up with neurosurgeon next week   [id number] 5-10 [id number] 11-20 [id number] 21-30 I did not refer this patient for an OV in the next 24 hours for this/these issue(s).  I discussed the assessment and treatment plan with the patient. The patient was provided an opportunity to ask  questions and all were answered. The patient agreed with the plan and demonstrated an understanding of the instructions.   The patient was advised to call back or seek an in-person evaluation if the symptoms worsen or if the condition fails to improve as anticipated.  I provided 23 minutes of non-face-to-face time during this encounter.   Carolann Littler, MD

## 2020-01-26 DIAGNOSIS — M5416 Radiculopathy, lumbar region: Secondary | ICD-10-CM | POA: Diagnosis not present

## 2020-01-26 DIAGNOSIS — M545 Low back pain: Secondary | ICD-10-CM | POA: Diagnosis not present

## 2020-01-28 ENCOUNTER — Other Ambulatory Visit: Payer: Self-pay | Admitting: Family Medicine

## 2020-02-01 ENCOUNTER — Telehealth: Payer: Self-pay | Admitting: *Deleted

## 2020-02-01 DIAGNOSIS — M5416 Radiculopathy, lumbar region: Secondary | ICD-10-CM | POA: Diagnosis not present

## 2020-02-01 NOTE — Telephone Encounter (Signed)
° °  [facility] Pre-operative Risk Assessment    HEARTCARE STAFF: - Please ensure there is not already an duplicate clearance open for this procedure. - Under Visit Info/Reason for Call, type in Other and utilize the format Clearance MM/DD/YY or Clearance TBD. Do not use dashes or single digits. - If request is for dental extraction, please clarify the # of teeth to be extracted.  Request for surgical clearance:  1. What type of surgery is being performed? L4-5 LUMBAR LAMINECTOMY    2. When is this surgery scheduled? TBD   3. What type of clearance is required (medical clearance vs. Pharmacy clearance to hold med vs. Both)? BOTH  4. Are there any medications that need to be held prior to surgery and how long? ELIQUIS   5. Practice name and name of physician performing surgery? [facility]; DR. Mallie Mussel POOL   6. What is the office phone number? [phone]   7.   What is the office fax number? [place]; [place]   8.   Anesthesia type (None, local, MAC, general) ? GENERAL   Julaine Hua 02/01/2020, 2:50 PM  _________________________________________________________________   (provider comments below)

## 2020-02-02 NOTE — Telephone Encounter (Signed)
Pt has appt for ECHO 02-16-20 and appt w/Dr Cooper 02-20-20. Will add to comments.

## 2020-02-02 NOTE — Telephone Encounter (Signed)
Primary [place], MD  Chart reviewed as part of pre-operative protocol coverage. Because of [name]'s past medical history and time since last visit, he/she will require a follow-up visit in order to better assess preoperative cardiovascular risk.  Pre-op covering staff: - Please schedule appointment and call patient to inform them. - Please contact requesting surgeon's office via preferred method (i.e, phone, fax) to inform them of need for appointment prior to surgery.  If applicable, this message will also be routed to pharmacy pool and/or primary cardiologist for input on holding anticoagulant/antiplatelet agent as requested below so that this information is available at time of patient's appointment.   Deberah Pelton, NP  02/02/2020, 9:59 AM

## 2020-02-02 NOTE — Telephone Encounter (Signed)
Patient with diagnosis of ATRIAL FIBRILLAITON on ELIQUIS for anticoagulation.    Procedure: LUMBAR LAMINECTOMY Date of procedure: TBD  CHADS2-VASc score of  6 (CHF, HTN, AGE x2, CAD, female)  CrCl = 32 ML/MIN Platelet count = 159  Per office protocol, patient can hold ELIQUIS for 3 days prior to procedure.

## 2020-02-16 ENCOUNTER — Ambulatory Visit (HOSPITAL_COMMUNITY): Payer: Medicare Other | Attending: Cardiology

## 2020-02-16 ENCOUNTER — Other Ambulatory Visit: Payer: Self-pay

## 2020-02-16 DIAGNOSIS — I340 Nonrheumatic mitral (valve) insufficiency: Secondary | ICD-10-CM

## 2020-02-16 LAB — ECHOCARDIOGRAM COMPLETE
Area-P 1/2: 2.44 cm2
MV M vel: 4 m/s
MV Peak grad: 64.1 mmHg
P 1/2 time: 527 msec
S' Lateral: 3.1 cm

## 2020-02-20 ENCOUNTER — Other Ambulatory Visit: Payer: Self-pay

## 2020-02-20 ENCOUNTER — Ambulatory Visit: Payer: Medicare Other | Admitting: Cardiovascular Disease

## 2020-02-20 ENCOUNTER — Encounter: Payer: Self-pay | Admitting: Cardiovascular Disease

## 2020-02-20 VITALS — BP 114/70 | HR 96 | Ht 64.0 in | Wt 95.4 lb

## 2020-02-20 DIAGNOSIS — I5032 Chronic diastolic (congestive) heart failure: Secondary | ICD-10-CM | POA: Diagnosis not present

## 2020-02-20 DIAGNOSIS — I340 Nonrheumatic mitral (valve) insufficiency: Secondary | ICD-10-CM | POA: Diagnosis not present

## 2020-02-20 DIAGNOSIS — I480 Paroxysmal atrial fibrillation: Secondary | ICD-10-CM | POA: Diagnosis not present

## 2020-02-20 DIAGNOSIS — Z01810 Encounter for preprocedural cardiovascular examination: Secondary | ICD-10-CM | POA: Diagnosis not present

## 2020-02-20 DIAGNOSIS — I2510 Atherosclerotic heart disease of native coronary artery without angina pectoris: Secondary | ICD-10-CM

## 2020-02-20 MED ORDER — METOPROLOL SUCCINATE ER 50 MG PO TB24: 50.0000 mg | ORAL_TABLET | Freq: Every day | ORAL | 3 refills | Status: DC

## 2020-02-20 NOTE — Progress Notes (Signed)
Cardiology Office Note:    Date:  02/20/2020   ID:  [name], DOB November 15, 1932, MRN [id number]  PCP:  [name], MD  [facility] Cardiologist:  Sherren Mocha, MD  [place] Electrophysiologist:  None   Referring MD: [name], MD   Chief Complaint  Patient presents with  . Shortness of Breath    History of Present Illness:    [name] is a 86 y.o. female with a hx of Coronary artery disease status post CABG in 2007.  Comorbid conditions include chronic anemia, mitral regurgitation, asymptomatic carotid stenosis, and paroxysmal atrial fibrillation.  She presents today for follow-up evaluation.  She is here with her husband.  The patient has developed problems with her back and cervical spine.  She is followed by Dr. Trenton Gammon and is considering lumbar back surgery for treatment of disc herniation.  She is really having a lot of pain and has not responded very well to conservative therapy.  She states she was doing better but today she is having a very bad day.  She is quite limited with just walking into the office.  She denies any change in her breathing.  She denies chest pain, chest pressure, orthopnea, or PND.  She has had some swelling of her right ankle.  She is tolerating oral anticoagulation.  Past Medical History:  Diagnosis Date  . Allergy   . Anemia, pernicious   . Arthritis    bursitis   . Atrophic gastritis   . B12 deficiency   . CAD (coronary artery disease)   . Carotid artery stenosis   . Cataract   . Diarrhea    on and off for months  . Family history of adverse reaction to anesthesia    extreme nausea brother and sister  . Fibromyalgia   . Gallbladder polyp   . GERD (gastroesophageal reflux disease)   . Heart murmur   . History of hiatal hernia   . History of kidney stones   . HLD (hyperlipidemia)   . HTN (hypertension)   . Hypothyroidism   . Irritable bowel syndrome (IBS)   . Median arcuate ligament syndrome ([place])   .  Osteoporosis   . PVD (peripheral vascular disease) ([place])   . Tubular adenoma of colon 08/2013   with focal high grade dysplasia    Past Surgical History:  Procedure Laterality Date  . ABDOMINAL HYSTERECTOMY    . APPENDECTOMY    . BIOPSY THYROID    . cataract surgery    . CHOLECYSTECTOMY N/A 10/24/2014   Procedure: MICROSCOPIC  CHOLECYSTECTOMY;  Surgeon: Ralene Ok, MD;  Location: [place];  Service: General;  Laterality: N/A;  . COLONOSCOPY    . CORONARY ARTERY BYPASS GRAFT  07  . ESOPHAGEAL MANOMETRY N/A 08/02/2012   Procedure: ESOPHAGEAL MANOMETRY (EM);  Surgeon: Sable Feil, MD;  Location: [facility];  Service: Endoscopy;  Laterality: N/A;  . ESOPHAGOGASTRODUODENOSCOPY (EGD) WITH PROPOFOL N/A 04/07/2019   Procedure: ESOPHAGOGASTRODUODENOSCOPY (EGD) WITH PROPOFOL;  Surgeon: Milus Banister, MD;  Location: [facility];  Service: Endoscopy;  Laterality: N/A;  . HEMOSTASIS CLIP PLACEMENT  04/07/2019   Procedure: HEMOSTASIS CLIP PLACEMENT;  Surgeon: Milus Banister, MD;  Location: [facility];  Service: Endoscopy;;  . HOT HEMOSTASIS N/A 04/07/2019   Procedure: HOT HEMOSTASIS (ARGON PLASMA COAGULATION/BICAP);  Surgeon: Milus Banister, MD;  Location: [facility];  Service: Endoscopy;  Laterality: N/A;  . POLYPECTOMY  04/07/2019   Procedure: POLYPECTOMY;  Surgeon: Owens Loffler  P, MD;  Location: [facility];  Service: Endoscopy;;  . SCLEROTHERAPY  04/07/2019   Procedure: SCLEROTHERAPY;  Surgeon: Milus Banister, MD;  Location: [facility];  Service: Endoscopy;;  . THYROID LOBECTOMY    . UPPER GASTROINTESTINAL ENDOSCOPY      Current Medications: Current Meds  Medication Sig  . acetaminophen (TYLENOL) 500 MG tablet Take 500 mg by mouth every 6 (six) hours as needed (pain).  Marland Kitchen atorvastatin (LIPITOR) 80 MG tablet TAKE 1 TABLET BY MOUTH DAILY AT BEDTIME  . Calcium Carbonate-Vitamin D (CALTRATE 600+D PO) Take 1 tablet by mouth daily with lunch.   . cyanocobalamin (,VITAMIN  B-12,) 1000 MCG/ML injection Inject 1,000 mcg into the muscle every 30 (thirty) days.  Marland Kitchen ELIQUIS 2.5 MG TABS tablet TAKE 1 TABLET BY MOUTH TWICE DAILY  . Ferrous Sulfate (IRON) 28 MG TABS Take 1 tablet by mouth daily.   . fluticasone (FLONASE) 50 MCG/ACT nasal spray Place 1 spray daily as needed into both nostrils for allergies or rhinitis.  Marland Kitchen gabapentin (NEURONTIN) 100 MG capsule Take 1 capsule (100 mg total) by mouth 2 (two) times daily.  Marland Kitchen HM LIDOCAINE PATCH EX Place 1 patch onto the skin daily as needed (pain.).  Marland Kitchen hyoscyamine (LEVSIN SL) 0.125 MG SL tablet Place 1 tablet (0.125 mg total) under the tongue every 6 (six) hours as needed.  Marland Kitchen ipratropium (ATROVENT) 0.03 % nasal spray Place 1 spray into both nostrils 2 (two) times daily.   Marland Kitchen lactase (LACTAID) 3000 units tablet Take 3,000 Units by mouth 3 (three) times daily with meals as needed.  Marland Kitchen levothyroxine (SYNTHROID, LEVOTHROID) 50 MCG tablet Take 50 mcg by mouth every evening.   Marland Kitchen LIDOCAINE EX Apply 1 application topically 3 (three) times daily as needed (pain). Roll-On (otc)  . loratadine (CLARITIN) 10 MG tablet Take 10 mg by mouth daily as needed for allergies.  Marland Kitchen losartan (COZAAR) 100 MG tablet TAKE 1 TABLET BY MOUTH DAILY  . Multiple Vitamins-Minerals (PRESERVISION AREDS 2 PO) Take 1 tablet by mouth 2 (two) times daily.  Vladimir Faster Glycol-Propyl Glycol (SYSTANE FREE OP) Place 1 drop into both eyes 3 (three) times daily as needed (dry/irritated eyes.).  Marland Kitchen saccharomyces boulardii (FLORASTOR) 250 MG capsule Take 250 mg by mouth 2 (two) times daily.  . sodium chloride (OCEAN) 0.65 % nasal spray Place 1 spray into both nostrils 3 (three) times daily as needed for congestion.   Marland Kitchen spironolactone (ALDACTONE) 25 MG tablet TAKE 1 TABLET BY MOUTH ONCE DAILY  . sucralfate (CARAFATE) 1 GM/10ML suspension TAKE 10 MLS (1 G TOTAL) BY MOUTH 2 (TWO) TIMES DAILY. (Patient taking differently: TAKE 10 MLS (1 G TOTAL) BY MOUTH 2 (TWO) TIMES DAILY as needed)    . zoledronic acid (RECLAST) 5 MG/100ML SOLN injection Reclast  . [DISCONTINUED] carvedilol (COREG) 6.25 MG tablet Take 1 tablet (6.25 mg total) by mouth 2 (two) times daily.     Allergies:   Actos [pioglitazone], Sulfonamide derivatives, Adhesive [tape], and Codeine   Social History   Socioeconomic History  . Marital status: Married    Spouse name: [name]  . Number of children: 0  . Years of education: 2  . Highest education level: Not on file  Occupational History  . Occupation: retired    Fish farm manager: RETIRED  Tobacco Use  . Smoking status: Never Smoker  . Smokeless tobacco: Never Used  Vaping Use  . Vaping Use: Never used  Substance and Sexual Activity  . Alcohol use: No  Alcohol/week: 0.0 standard drinks  . Drug use: No  . Sexual activity: Not on file  Other Topics Concern  . Not on file  Social History Narrative   [name], Married. Lives with husband. Had been a very active woman.   Social Determinants of Health   Financial Resource Strain: Low Risk   . Difficulty of Paying Living Expenses: Not hard at all  Food Insecurity:   . Worried About Charity fundraiser in the Last Year: Not on file  . Ran Out of Food in the Last Year: Not on file  Transportation Needs: No Transportation Needs  . Lack of Transportation (Medical): No  . Lack of Transportation (Non-Medical): No  Physical Activity:   . Days of Exercise per Week: Not on file  . Minutes of Exercise per Session: Not on file  Stress:   . Feeling of Stress : Not on file  Social Connections:   . Frequency of Communication with Friends and Family: Not on file  . Frequency of Social Gatherings with Friends and Family: Not on file  . Attends Religious Services: Not on file  . Active Member of Clubs or Organizations: Not on file  . Attends Archivist Meetings: Not on file  . Marital Status: Not on file     Family History: The patient's family history includes Breast cancer in an other family member;  Cholelithiasis in her mother; Colon cancer in her brother; Diabetes in her sister; Heart disease in her mother; Lung cancer in her father and sister; Prostate cancer in her brother; Stomach cancer in her cousin. There is no history of Esophageal cancer or Rectal cancer.  ROS:   Please see the history of present illness.    All other systems reviewed and are negative.  EKGs/Labs/Other Studies Reviewed:    The following studies were reviewed today: Echo 02-16-2020: IMPRESSIONS    1. Left ventricular ejection fraction, by estimation, is 45 to 50%. The  left ventricle has mildly decreased function. The left ventricle  demonstrates global hypokinesis with septal-lateral dyssynchrony  consistent with LBBB. Left ventricular diastolic  parameters are indeterminate.  2. Right ventricular systolic function is normal. The right ventricular  size is normal. There is normal pulmonary artery systolic pressure. The  estimated right ventricular systolic pressure is 85.4 mmHg.  3. Left atrial size was mildly dilated.  4. Right atrial size was mildly dilated.  5. The mitral valve is abnormal. Restriction of posterior leaflet with  probably severe, eccentric anteriorly-directed mitral valve regurgitation.  No evidence of mitral stenosis.  6. The aortic valve is tricuspid. Aortic valve regurgitation is mild. No  aortic stenosis is present.  7. The inferior vena cava is normal in size with greater than 50%  respiratory variability, suggesting right atrial pressure of 3 mmHg.   EKG:  EKG is ordered today.  The ekg ordered today demonstrates atrial fibrillation 96 bpm, left bundle branch block.  Recent Labs: 10/27/2019: ALT 14; BUN 19; Creatinine, Ser 0.85; Hemoglobin 13.2; Platelets 159.0; Potassium 3.7; Sodium 140  Recent Lipid Panel    Component Value Date/Time   CHOL 135 11/18/2016 0000   TRIG 54 11/18/2016 0000   TRIG 85 02/05/2006 0818   HDL 69.20 11/22/2014 0919   CHOLHDL 2 11/22/2014  0919   VLDL 10.6 11/22/2014 0919   LDLCALC 66 11/22/2014 0919     Risk Assessment/Calculations:   {This patient does not have a recorded CHADS2-VASc score.   Click here to calculate score.  Then [facility] your note.       :[id number]}  CHA2DS2-VASc Score = 6  This indicates a 9.7% annual risk of stroke. The patient's score is based upon: CHF History: 1 HTN History: 1 Diabetes History: 0 Stroke History: 0 Vascular Disease History: 1 Age Score: 2 Gender Score: 1      Physical Exam:    VS:  BP 114/70   Pulse 96   Ht 5\' 4"  (1.626 m)   Wt 95 lb 6.4 oz (43.3 kg)   SpO2 97%   BMI 16.38 kg/m     Wt Readings from Last 3 Encounters:  02/20/20 95 lb 6.4 oz (43.3 kg)  01/24/20 93 lb (42.2 kg)  01/09/20 93 lb (42.2 kg)     GEN: frail, elderly woman in no acute distress HEENT: Normal NECK: No JVD; No carotid bruits LYMPHATICS: No lymphadenopathy CARDIAC: irregularly irregular, 2/6 holosystolic murmur at the LLSB RESPIRATORY:  Clear to auscultation without rales, wheezing or rhonchi  ABDOMEN: Soft, non-tender, non-distended MUSCULOSKELETAL:  trace edema right ankle; No deformity  SKIN: Warm and dry NEUROLOGIC:  Alert and oriented x 3 PSYCHIATRIC:  Normal affect   ASSESSMENT:    1. Coronary artery disease involving native coronary artery of native heart without angina pectoris   2. Nonrheumatic mitral valve regurgitation   3. Chronic diastolic heart failure (HCC)   4. Paroxysmal atrial fibrillation ([place])   5. Preop cardiovascular exam    PLAN:    In order of problems listed above:  1. Stable without symptoms of angina.  Continue current medical therapy with apixaban, high intensity statin drug, and a beta-blocker.  See discussion below for change in beta-blocker treatment. 2. The patient has moderate to severe mitral regurgitation probably secondary to ischemic MR with restriction of the posterior leaflet.  Difficult to ascertain New York Heart Association functional  class because of her marked limitation secondary to back pain.  She does not have any symptoms of decompensated heart failure.  LVEF is reduced but stable over serial echo studies.  It would be reasonable to proceed with transesophageal echo if we are going to consider treating her with transcatheter edge-to-edge mitral valve repair.  At the present time it seems like her biggest problem is severe back pain and I think it is probably best treat her cardiac disease medically.  Will arrange close follow-up in 4 weeks to see how she is doing. 3. Stable symptoms at present with no orthopnea or PND.  No evidence of volume overload on exam.  Recent echo reviewed as above. 4. The patient is in atrial flutter today.  We will change her beta-blocker from carvedilol to metoprolol succinate in an attempt to obtain better heart rate control.  He does not appear to be decompensated.  She is appropriately anticoagulated with apixaban.  I am going to set her up with a ZIO monitor to assess rate control after the change to metoprolol succinate. 5. I think the patient would be at intermediate cardiovascular risk of back surgery.  She does not have any anginal symptoms, nor does she have evidence of decompensated heart failure.  Her biggest issues relate to atrial fibrillation and mitral regurgitation.  As long as her heart rate is reasonably controlled, I think she could proceed with surgery.  I will arrange a follow-up visit here in 4 weeks with Richardson Dopp.  We are changing her from carvedilol to metoprolol succinate.  Today her heart rate is 96 beats per minute.  I do not  think cardioverting her preoperatively would mitigate her risk and it would only delay surgery further.  Her chance of atrial fibrillation postoperatively would be very high and I think it makes sense to rate control her at this point.  Would be happy to follow her perioperatively if she has any heart related issues.  She is still debating whether to move  forward with surgery or not.  She has become physically quite frail over time.   Medication Adjustments/Labs and Tests Ordered: Current medicines are reviewed at length with the patient today.  Concerns regarding medicines are outlined above.  Orders Placed This Encounter  Procedures  . LONG TERM MONITOR (3-14 DAYS)  . EKG 12-Lead   Meds ordered this encounter  Medications  . metoprolol succinate (TOPROL-XL) 50 MG 24 hr tablet    Sig: Take 1 tablet (50 mg total) by mouth daily. Take with or immediately following a meal.    Dispense:  90 tablet    Refill:  3    Patient Instructions  Medication Instructions:  1) STOP COREG 2) START TOPROL (metoprolol succinate) 50 mg daily  *If you need a refill on your cardiac medications before your next appointment, please call your pharmacy*  Testing/Procedures: Dr. Burt Knack recommends you wear a heart monitor for 3 days. This will be mailed to your home.  Follow-Up: You have an appointment scheduled with Richardson Dopp 03/21/2020 at 11:45AM.    ZIO XT- Long Term Monitor Instructions   Your physician has requested you wear your ZIO patch monitor 3 days.   This is a single patch monitor.  [employer] supplies one patch monitor per enrollment.  Additional stickers are not available.   Please do not apply patch if you will be having a Nuclear Stress Test, Echocardiogram, Cardiac CT, MRI, or Chest Xray during the time frame you would be wearing the monitor. The patch cannot be worn during these tests.  You cannot remove and re-apply the ZIO XT patch monitor.   Your ZIO patch monitor will be sent USPS Priority mail from [facility] directly to your home address. The monitor may also be mailed to a PO BOX if home delivery is not available.   It may take 3-5 days to receive your monitor after you have been enrolled.   Once you have received you monitor, please review enclosed instructions.  Your monitor has already been registered assigning a  specific monitor serial # to you.   Applying the monitor   Shave hair from upper left chest.   Hold abrader disc by orange tab.  Rub abrader in 40 strokes over left upper chest as indicated in your monitor instructions.   Clean area with 4 enclosed alcohol pads .  Use all pads to assure are is cleaned thoroughly.  Let dry.   Apply patch as indicated in monitor instructions.  Patch will be place under collarbone on left side of chest with arrow pointing upward.   Rub patch adhesive wings for 2 minutes.Remove white label marked "1".  Remove white label marked "2".  Rub patch adhesive wings for 2 additional minutes.   While looking in a mirror, press and release button in center of patch.  A small green light will flash 3-4 times .  This will be your only indicator the monitor has been turned on.     Do not shower for the first 24 hours.  You may shower after the first 24 hours.   Press button if you feel a symptom. You  will hear a small click.  Record Date, Time and Symptom in the Patient Log Book.   When you are ready to remove patch, follow instructions on last 2 pages of Patient Log Book.  Stick patch monitor onto last page of Patient Log Book.   Place Patient Log Book in [place] box.  Use locking tab on box and tape box closed securely.  The [employer] and [employer] has [employer] on it.  Please place in mailbox as soon as possible.  Your physician should have your test results approximately 7 days after the monitor has been mailed back to [facility].   Call [place] at [phone] if you have questions regarding your ZIO XT patch monitor.  Call them immediately if you see an orange light blinking on your monitor.   If your monitor falls off in less than 4 days contact our Monitor department at [phone].  If your monitor becomes loose or falls off after 4 days call [employer] at [phone] for suggestions on securing your monitor.      Signed, Sherren Mocha, MD  02/20/2020 2:38 PM    [place]

## 2020-02-20 NOTE — Patient Instructions (Addendum)
Medication Instructions:  1) STOP COREG 2) START TOPROL (metoprolol succinate) 50 mg daily  *If you need a refill on your cardiac medications before your next appointment, please call your pharmacy*  Testing/Procedures: Dr. Burt Knack recommends you wear a heart monitor for 3 days. This will be mailed to your home.  Follow-Up: You have an appointment scheduled with Richardson Dopp 03/21/2020 at 11:45AM.    ZIO XT- Long Term Monitor Instructions   Your physician has requested you wear your ZIO patch monitor 3 days.   This is a single patch monitor.  [employer] supplies one patch monitor per enrollment.  Additional stickers are not available.   Please do not apply patch if you will be having a Nuclear Stress Test, Echocardiogram, Cardiac CT, MRI, or Chest Xray during the time frame you would be wearing the monitor. The patch cannot be worn during these tests.  You cannot remove and re-apply the ZIO XT patch monitor.   Your ZIO patch monitor will be sent USPS Priority mail from [facility] directly to your home address. The monitor may also be mailed to a PO BOX if home delivery is not available.   It may take 3-5 days to receive your monitor after you have been enrolled.   Once you have received you monitor, please review enclosed instructions.  Your monitor has already been registered assigning a specific monitor serial # to you.   Applying the monitor   Shave hair from upper left chest.   Hold abrader disc by orange tab.  Rub abrader in 40 strokes over left upper chest as indicated in your monitor instructions.   Clean area with 4 enclosed alcohol pads .  Use all pads to assure are is cleaned thoroughly.  Let dry.   Apply patch as indicated in monitor instructions.  Patch will be place under collarbone on left side of chest with arrow pointing upward.   Rub patch adhesive wings for 2 minutes.Remove white label marked "1".  Remove white label marked "2".  Rub patch adhesive wings for  2 additional minutes.   While looking in a mirror, press and release button in center of patch.  A small green light will flash 3-4 times .  This will be your only indicator the monitor has been turned on.     Do not shower for the first 24 hours.  You may shower after the first 24 hours.   Press button if you feel a symptom. You will hear a small click.  Record Date, Time and Symptom in the Patient Log Book.   When you are ready to remove patch, follow instructions on last 2 pages of Patient Log Book.  Stick patch monitor onto last page of Patient Log Book.   Place Patient Log Book in [place] box.  Use locking tab on box and tape box closed securely.  The [employer] has [employer] on it.  Please place in mailbox as soon as possible.  Your physician should have your test results approximately 7 days after the monitor has been mailed back to [facility].   Call [place] at [phone] if you have questions regarding your ZIO XT patch monitor.  Call them immediately if you see an orange light blinking on your monitor.   If your monitor falls off in less than 4 days contact our Monitor department at [phone].  If your monitor becomes loose or falls off after 4 days call [employer] at [phone] for suggestions on  securing your monitor.

## 2020-02-21 ENCOUNTER — Encounter: Payer: Self-pay | Admitting: *Deleted

## 2020-02-21 NOTE — Progress Notes (Signed)
Patient ID: [name], female   DOB: 08/08/1932, 86 y.o.   MRN: [id number] Patient enrolled for [employer] to ship a 3 day ZIO XT long term holter monitor to her home.

## 2020-03-07 DIAGNOSIS — M5416 Radiculopathy, lumbar region: Secondary | ICD-10-CM | POA: Diagnosis not present

## 2020-03-07 DIAGNOSIS — M5459 Other low back pain: Secondary | ICD-10-CM | POA: Diagnosis not present

## 2020-03-12 DIAGNOSIS — M5416 Radiculopathy, lumbar region: Secondary | ICD-10-CM | POA: Diagnosis not present

## 2020-03-12 DIAGNOSIS — M5459 Other low back pain: Secondary | ICD-10-CM | POA: Diagnosis not present

## 2020-03-14 ENCOUNTER — Other Ambulatory Visit (INDEPENDENT_AMBULATORY_CARE_PROVIDER_SITE_OTHER): Payer: Medicare Other

## 2020-03-14 DIAGNOSIS — I480 Paroxysmal atrial fibrillation: Secondary | ICD-10-CM

## 2020-03-19 DIAGNOSIS — R430 Anosmia: Secondary | ICD-10-CM | POA: Diagnosis not present

## 2020-03-20 NOTE — Progress Notes (Signed)
Cardiology Office Note:    Date:  03/21/2020   ID:  [name], DOB 15-May-1932, MRN [id number]  PCP:  [name], MD  [facility] Cardiologist:  Sherren Mocha, MD   [facility] Electrophysiologist:  None   Referring MD: [name], MD   Chief Complaint:  Follow-up (AFib, CHF, CAD)    Patient Profile:    [name] is a 86 y.o. female with:   Coronary artery disease   S/p CABG in 2007  Chronic anemia  Mitral regurgitation, mod to severe  Probably 2/2 ischemic MR w restriction of post leaflet  HFmrEF (heart failure with mildly reduced ejection fraction)  Carotid artery disease  Paroxysmal atrial fibrillation   Lumbar HNP   Hypertension   Hyperlipidemia   Hypothyroidism   Prior CV studies: Echocardiogram 02/16/20 EF 45-50, normal RVSF, RVSP 31.1, mild BAE, restriction of post MV leaflet, severe eccentric ant direted MR, mild AI  Carotid US 12/29/17 Bilat ICA 1-39   History of Present Illness:    [name] was last seen by Dr. Burt Knack 02/20/20.  She was quite limited at that time due to severe back pain related to a herniated lumbar disc.  She was noted to be in AFlutter.  Her Carvedilol was changed to Metoprolol succinate in an attempt to achieve better rate control.  A 3 day Zio patch monitor was ordered to assess rate control.  No results are available yet.  She is brought back for close f/u.  She is here with her husband.  She has felt quite fatigued.  She has a poor appetite.  She is still working with PT for her back.  She has not had chest pain or significant shortness of breath.  She has not had orthopnea, leg edema.         Past Medical History:  Diagnosis Date  . Allergy   . Anemia, pernicious   . Arthritis    bursitis   . Atrophic gastritis   . B12 deficiency   . CAD (coronary artery disease)   . Carotid artery stenosis   . Cataract   . Diarrhea    on and off for months  . Family history of adverse reaction  to anesthesia    extreme nausea brother and sister  . Fibromyalgia   . Gallbladder polyp   . GERD (gastroesophageal reflux disease)   . Heart murmur   . History of hiatal hernia   . History of kidney stones   . HLD (hyperlipidemia)   . HTN (hypertension)   . Hypothyroidism   . Irritable bowel syndrome (IBS)   . Median arcuate ligament syndrome ([place])   . Osteoporosis   . PVD (peripheral vascular disease) ([place])   . Tubular adenoma of colon 08/2013   with focal high grade dysplasia    Current Medications: Current Meds  Medication Sig  . acetaminophen (TYLENOL) 500 MG tablet Take 500 mg by mouth every 6 (six) hours as needed (pain).  Marland Kitchen atorvastatin (LIPITOR) 80 MG tablet TAKE 1 TABLET BY MOUTH DAILY AT BEDTIME  . Calcium Carbonate-Vitamin D (CALTRATE 600+D PO) Take 1 tablet by mouth daily with lunch.   . cyanocobalamin (,VITAMIN B-12,) 1000 MCG/ML injection Inject 1,000 mcg into the muscle every 30 (thirty) days.  Marland Kitchen ELIQUIS 2.5 MG TABS tablet TAKE 1 TABLET BY MOUTH TWICE DAILY  . Ferrous Sulfate (IRON) 28 MG TABS Take 1 tablet by mouth daily.   . fluticasone (FLONASE) 50 MCG/ACT nasal spray  Place 1 spray daily as needed into both nostrils for allergies or rhinitis.  Marland Kitchen gabapentin (NEURONTIN) 100 MG capsule Take 1 capsule (100 mg total) by mouth 2 (two) times daily.  Marland Kitchen HM LIDOCAINE PATCH EX Place 1 patch onto the skin daily as needed (pain.).  Marland Kitchen hyoscyamine (LEVSIN SL) 0.125 MG SL tablet Place 1 tablet (0.125 mg total) under the tongue every 6 (six) hours as needed.  Marland Kitchen ipratropium (ATROVENT) 0.03 % nasal spray Place 1 spray into both nostrils 2 (two) times daily.   Marland Kitchen lactase (LACTAID) 3000 units tablet Take 3,000 Units by mouth 3 (three) times daily with meals as needed.  Marland Kitchen levothyroxine (SYNTHROID, LEVOTHROID) 50 MCG tablet Take 50 mcg by mouth every evening.   Marland Kitchen LIDOCAINE EX Apply 1 application topically 3 (three) times daily as needed (pain). Roll-On (otc)  . loratadine (CLARITIN) 10  MG tablet Take 10 mg by mouth daily as needed for allergies.  Marland Kitchen losartan (COZAAR) 100 MG tablet TAKE 1 TABLET BY MOUTH DAILY  . metoprolol succinate (TOPROL-XL) 50 MG 24 hr tablet Take 1 tablet (50 mg total) by mouth daily. Take with or immediately following a meal.  . Multiple Vitamins-Minerals (PRESERVISION AREDS 2 PO) Take 1 tablet by mouth 2 (two) times daily.  Vladimir Faster Glycol-Propyl Glycol (SYSTANE FREE OP) Place 1 drop into both eyes 3 (three) times daily as needed (dry/irritated eyes.).  Marland Kitchen saccharomyces boulardii (FLORASTOR) 250 MG capsule Take 250 mg by mouth 2 (two) times daily.  . sodium chloride (OCEAN) 0.65 % nasal spray Place 1 spray into both nostrils 3 (three) times daily as needed for congestion.   Marland Kitchen spironolactone (ALDACTONE) 25 MG tablet TAKE 1 TABLET BY MOUTH ONCE DAILY  . sucralfate (CARAFATE) 1 GM/10ML suspension TAKE 10 MLS (1 G TOTAL) BY MOUTH 2 (TWO) TIMES DAILY. (Patient taking differently: TAKE 10 MLS (1 G TOTAL) BY MOUTH 2 (TWO) TIMES DAILY as needed)  . zoledronic acid (RECLAST) 5 MG/100ML SOLN injection Reclast     Allergies:   Actos [pioglitazone], Sulfonamide derivatives, Adhesive [tape], and Codeine   Social History   Tobacco Use  . Smoking status: Never Smoker  . Smokeless tobacco: Never Used  Vaping Use  . Vaping Use: Never used  Substance Use Topics  . Alcohol use: No    Alcohol/week: 0.0 standard drinks  . Drug use: No     Family Hx: The patient's family history includes Breast cancer in an other family member; Cholelithiasis in her mother; Colon cancer in her brother; Diabetes in her sister; Heart disease in her mother; Lung cancer in her father and sister; Prostate cancer in her brother; Stomach cancer in her cousin. There is no history of Esophageal cancer or Rectal cancer.  ROS   EKGs/Labs/Other Test Reviewed:    EKG:  EKG is   ordered today.  The ekg ordered today demonstrates AFib, HR 80, RAD, IVCD, QTc 479  Recent Labs: 10/27/2019: ALT  14; BUN 19; Creatinine, Ser 0.85; Hemoglobin 13.2; Platelets 159.0; Potassium 3.7; Sodium 140   Recent Lipid Panel Lab Results  Component Value Date/Time   CHOL 135 11/18/2016 12:00 AM   TRIG 54 11/18/2016 12:00 AM   TRIG 85 02/05/2006 08:18 AM   HDL 69.20 11/22/2014 09:19 AM   CHOLHDL 2 11/22/2014 09:19 AM   LDLCALC 66 11/22/2014 09:19 AM      Risk Assessment/Calculations:     CHA2DS2-VASc Score = 6  This indicates a 9.7% annual risk of stroke. The patient's score  is based upon: CHF History: 1 HTN History: 1 Diabetes History: 0 Stroke History: 0 Vascular Disease History: 1 Age Score: 2 Gender Score: 1     Physical Exam:    VS:  BP (!) 126/58   Pulse (!) 45   Ht 5\' 4"  (1.626 m)   Wt 86 lb 12.8 oz (39.4 kg)   SpO2 95%   BMI 14.90 kg/m     Wt Readings from Last 3 Encounters:  03/21/20 86 lb 12.8 oz (39.4 kg)  02/20/20 95 lb 6.4 oz (43.3 kg)  01/24/20 93 lb (42.2 kg)     Constitutional:      Appearance: Healthy appearance. Not in distress.  Pulmonary:     Effort: Pulmonary effort is normal.     Breath sounds: No wheezing. No rales.  Cardiovascular:     Normal rate. Irregularly irregular rhythm. Normal S1. Normal S2.  Edema:    Peripheral edema absent.  Abdominal:     Palpations: Abdomen is soft.  Musculoskeletal:     Cervical back: Neck supple. Skin:    General: Skin is warm and dry.  Neurological:     Mental Status: Alert and oriented to person, place and time.     Cranial Nerves: Cranial nerves are intact.       ASSESSMENT & PLAN:    1. Persistent atrial fibrillation (HCC) Her HR is well controlled today.  She just sent her Zio patch monitor in and we do not have the results yet. She feels quite fatigued and does not have much of an appetite.  Question if her symptoms are related to atrial fibrillation.  Dr. Burt Knack held off on DCCV at last visit to avoid delaying back surgery.  But, the patient notes that she would not be going through surgery any  time soon.  I reviewed this with Dr. Burt Knack today.  We think it is worth trying to restore normal sinus rhythm.  She may have missed a dose of Apixaban last week.  Therefore, we will proceed with a TEE-DCCV next week.  Obtain BMET, CBC today.    2. Preoperative cardiovascular examination As noted previously, she would be intermediate risk for CV complications during back surgery.  She will need to remain on uninterrupted anticoagulation for 4 weeks post DCCV.    3. Nonrheumatic mitral valve regurgitation Mod to severe MR by recent echocardiogram.  She will undergo a TEE prior to her DCCV.  We will be able to assess her MR better with this.    4. HFmrEF (heart failure with mildly reduced ejection fraction)   Volume overall appears stable.  Continue current Rx.   5. Coronary artery disease involving native coronary artery of native heart without angina pectoris She has not had any angina.  Continue atorvastatin.    Shared Decision Making/Informed Consent    The risks [stroke, cardiac arrhythmias rarely resulting in the need for a temporary or permanent pacemaker, skin irritation or burns, esophageal damage, perforation (1:10,000 risk), bleeding, pharyngeal hematoma as well as other potential complications associated with conscious sedation including aspiration, arrhythmia, respiratory failure and death], benefits (treatment guidance, restoration of normal sinus rhythm, diagnostic support) and alternatives of a transesophageal echocardiogram guided cardioversion were discussed in detail with Ms. Pascoe and she is willing to proceed.    Dispo:  Return in about 3 weeks (around 04/11/2020) for Post Procedure Follow Up, w/ Dr. Burt Knack, or Richardson Dopp, PA-C, in person.   Medication Adjustments/Labs and Tests Ordered: Current medicines are reviewed at  length with the patient today.  Concerns regarding medicines are outlined above.  Tests Ordered: Orders Placed This Encounter  Procedures  . Basic  metabolic panel  . CBC  . EKG 12-Lead   Medication Changes: No orders of the defined types were placed in this encounter.   Signed, Richardson Dopp, PA-C  03/21/2020 3:30 PM    [facility] [address], [place], [place]  [address] Phone: [phone]; Fax: [phone]

## 2020-03-20 NOTE — H&P (View-Only) (Signed)
Cardiology Office Note:    Date:  03/21/2020   ID:  [name], DOB 1932-08-26, MRN [id number]  PCP:  [name], MD  [facility] Cardiologist:  Sherren Mocha, MD   [facility] Electrophysiologist:  None   Referring MD: [name], MD   Chief Complaint:  Follow-up (AFib, CHF, CAD)    Patient Profile:    [name] is a 86 y.o. female with:   Coronary artery disease   S/p CABG in 2007  Chronic anemia  Mitral regurgitation, mod to severe  Probably 2/2 ischemic MR w restriction of post leaflet  HFmrEF (heart failure with mildly reduced ejection fraction)  Carotid artery disease  Paroxysmal atrial fibrillation   Lumbar HNP   Hypertension   Hyperlipidemia   Hypothyroidism   Prior CV studies: Echocardiogram 02/16/20 EF 45-50, normal RVSF, RVSP 31.1, mild BAE, restriction of post MV leaflet, severe eccentric ant direted MR, mild AI  Carotid US 12/29/17 Bilat ICA 1-39   History of Present Illness:    [name] was last seen by Dr. Burt Knack 02/20/20.  She was quite limited at that time due to severe back pain related to a herniated lumbar disc.  She was noted to be in AFlutter.  Her Carvedilol was changed to Metoprolol succinate in an attempt to achieve better rate control.  A 3 day Zio patch monitor was ordered to assess rate control.  No results are available yet.  She is brought back for close f/u.  She is here with her husband.  She has felt quite fatigued.  She has a poor appetite.  She is still working with PT for her back.  She has not had chest pain or significant shortness of breath.  She has not had orthopnea, leg edema.         Past Medical History:  Diagnosis Date  . Allergy   . Anemia, pernicious   . Arthritis    bursitis   . Atrophic gastritis   . B12 deficiency   . CAD (coronary artery disease)   . Carotid artery stenosis   . Cataract   . Diarrhea    on and off for months  . Family history of adverse reaction  to anesthesia    extreme nausea brother and sister  . Fibromyalgia   . Gallbladder polyp   . GERD (gastroesophageal reflux disease)   . Heart murmur   . History of hiatal hernia   . History of kidney stones   . HLD (hyperlipidemia)   . HTN (hypertension)   . Hypothyroidism   . Irritable bowel syndrome (IBS)   . Median arcuate ligament syndrome ([place])   . Osteoporosis   . PVD (peripheral vascular disease) ([place])   . Tubular adenoma of colon 08/2013   with focal high grade dysplasia    Current Medications: Current Meds  Medication Sig  . acetaminophen (TYLENOL) 500 MG tablet Take 500 mg by mouth every 6 (six) hours as needed (pain).  Marland Kitchen atorvastatin (LIPITOR) 80 MG tablet TAKE 1 TABLET BY MOUTH DAILY AT BEDTIME  . Calcium Carbonate-Vitamin D (CALTRATE 600+D PO) Take 1 tablet by mouth daily with lunch.   . cyanocobalamin (,VITAMIN B-12,) 1000 MCG/ML injection Inject 1,000 mcg into the muscle every 30 (thirty) days.  Marland Kitchen ELIQUIS 2.5 MG TABS tablet TAKE 1 TABLET BY MOUTH TWICE DAILY  . Ferrous Sulfate (IRON) 28 MG TABS Take 1 tablet by mouth daily.   . fluticasone (FLONASE) 50 MCG/ACT nasal spray  Place 1 spray daily as needed into both nostrils for allergies or rhinitis.  Marland Kitchen gabapentin (NEURONTIN) 100 MG capsule Take 1 capsule (100 mg total) by mouth 2 (two) times daily.  Marland Kitchen HM LIDOCAINE PATCH EX Place 1 patch onto the skin daily as needed (pain.).  Marland Kitchen hyoscyamine (LEVSIN SL) 0.125 MG SL tablet Place 1 tablet (0.125 mg total) under the tongue every 6 (six) hours as needed.  Marland Kitchen ipratropium (ATROVENT) 0.03 % nasal spray Place 1 spray into both nostrils 2 (two) times daily.   Marland Kitchen lactase (LACTAID) 3000 units tablet Take 3,000 Units by mouth 3 (three) times daily with meals as needed.  Marland Kitchen levothyroxine (SYNTHROID, LEVOTHROID) 50 MCG tablet Take 50 mcg by mouth every evening.   Marland Kitchen LIDOCAINE EX Apply 1 application topically 3 (three) times daily as needed (pain). Roll-On (otc)  . loratadine (CLARITIN) 10  MG tablet Take 10 mg by mouth daily as needed for allergies.  Marland Kitchen losartan (COZAAR) 100 MG tablet TAKE 1 TABLET BY MOUTH DAILY  . metoprolol succinate (TOPROL-XL) 50 MG 24 hr tablet Take 1 tablet (50 mg total) by mouth daily. Take with or immediately following a meal.  . Multiple Vitamins-Minerals (PRESERVISION AREDS 2 PO) Take 1 tablet by mouth 2 (two) times daily.  Vladimir Faster Glycol-Propyl Glycol (SYSTANE FREE OP) Place 1 drop into both eyes 3 (three) times daily as needed (dry/irritated eyes.).  Marland Kitchen saccharomyces boulardii (FLORASTOR) 250 MG capsule Take 250 mg by mouth 2 (two) times daily.  . sodium chloride (OCEAN) 0.65 % nasal spray Place 1 spray into both nostrils 3 (three) times daily as needed for congestion.   Marland Kitchen spironolactone (ALDACTONE) 25 MG tablet TAKE 1 TABLET BY MOUTH ONCE DAILY  . sucralfate (CARAFATE) 1 GM/10ML suspension TAKE 10 MLS (1 G TOTAL) BY MOUTH 2 (TWO) TIMES DAILY. (Patient taking differently: TAKE 10 MLS (1 G TOTAL) BY MOUTH 2 (TWO) TIMES DAILY as needed)  . zoledronic acid (RECLAST) 5 MG/100ML SOLN injection Reclast     Allergies:   Actos [pioglitazone], Sulfonamide derivatives, Adhesive [tape], and Codeine   Social History   Tobacco Use  . Smoking status: Never Smoker  . Smokeless tobacco: Never Used  Vaping Use  . Vaping Use: Never used  Substance Use Topics  . Alcohol use: No    Alcohol/week: 0.0 standard drinks  . Drug use: No     Family Hx: The patient's family history includes Breast cancer in an other family member; Cholelithiasis in her mother; Colon cancer in her brother; Diabetes in her sister; Heart disease in her mother; Lung cancer in her father and sister; Prostate cancer in her brother; Stomach cancer in her cousin. There is no history of Esophageal cancer or Rectal cancer.  ROS   EKGs/Labs/Other Test Reviewed:    EKG:  EKG is   ordered today.  The ekg ordered today demonstrates AFib, HR 80, RAD, IVCD, QTc 479  Recent Labs: 10/27/2019: ALT  14; BUN 19; Creatinine, Ser 0.85; Hemoglobin 13.2; Platelets 159.0; Potassium 3.7; Sodium 140   Recent Lipid Panel Lab Results  Component Value Date/Time   CHOL 135 11/18/2016 12:00 AM   TRIG 54 11/18/2016 12:00 AM   TRIG 85 02/05/2006 08:18 AM   HDL 69.20 11/22/2014 09:19 AM   CHOLHDL 2 11/22/2014 09:19 AM   LDLCALC 66 11/22/2014 09:19 AM      Risk Assessment/Calculations:     CHA2DS2-VASc Score = 6  This indicates a 9.7% annual risk of stroke. The patient's score  is based upon: CHF History: 1 HTN History: 1 Diabetes History: 0 Stroke History: 0 Vascular Disease History: 1 Age Score: 2 Gender Score: 1     Physical Exam:    VS:  BP (!) 126/58   Pulse (!) 45   Ht 5\' 4"  (1.626 m)   Wt 86 lb 12.8 oz (39.4 kg)   SpO2 95%   BMI 14.90 kg/m     Wt Readings from Last 3 Encounters:  03/21/20 86 lb 12.8 oz (39.4 kg)  02/20/20 95 lb 6.4 oz (43.3 kg)  01/24/20 93 lb (42.2 kg)     Constitutional:      Appearance: Healthy appearance. Not in distress.  Pulmonary:     Effort: Pulmonary effort is normal.     Breath sounds: No wheezing. No rales.  Cardiovascular:     Normal rate. Irregularly irregular rhythm. Normal S1. Normal S2.  Edema:    Peripheral edema absent.  Abdominal:     Palpations: Abdomen is soft.  Musculoskeletal:     Cervical back: Neck supple. Skin:    General: Skin is warm and dry.  Neurological:     Mental Status: Alert and oriented to person, place and time.     Cranial Nerves: Cranial nerves are intact.       ASSESSMENT & PLAN:    1. Persistent atrial fibrillation (HCC) Her HR is well controlled today.  She just sent her Zio patch monitor in and we do not have the results yet. She feels quite fatigued and does not have much of an appetite.  Question if her symptoms are related to atrial fibrillation.  Dr. Burt Knack held off on DCCV at last visit to avoid delaying back surgery.  But, the patient notes that she would not be going through surgery any  time soon.  I reviewed this with Dr. Burt Knack today.  We think it is worth trying to restore normal sinus rhythm.  She may have missed a dose of Apixaban last week.  Therefore, we will proceed with a TEE-DCCV next week.  Obtain BMET, CBC today.    2. Preoperative cardiovascular examination As noted previously, she would be intermediate risk for CV complications during back surgery.  She will need to remain on uninterrupted anticoagulation for 4 weeks post DCCV.    3. Nonrheumatic mitral valve regurgitation Mod to severe MR by recent echocardiogram.  She will undergo a TEE prior to her DCCV.  We will be able to assess her MR better with this.    4. HFmrEF (heart failure with mildly reduced ejection fraction)   Volume overall appears stable.  Continue current Rx.   5. Coronary artery disease involving native coronary artery of native heart without angina pectoris She has not had any angina.  Continue atorvastatin.    Shared Decision Making/Informed Consent    The risks [stroke, cardiac arrhythmias rarely resulting in the need for a temporary or permanent pacemaker, skin irritation or burns, esophageal damage, perforation (1:10,000 risk), bleeding, pharyngeal hematoma as well as other potential complications associated with conscious sedation including aspiration, arrhythmia, respiratory failure and death], benefits (treatment guidance, restoration of normal sinus rhythm, diagnostic support) and alternatives of a transesophageal echocardiogram guided cardioversion were discussed in detail with Ms. Solar and she is willing to proceed.    Dispo:  Return in about 3 weeks (around 04/11/2020) for Post Procedure Follow Up, w/ Dr. Burt Knack, or Richardson Dopp, PA-C, in person.   Medication Adjustments/Labs and Tests Ordered: Current medicines are reviewed at  length with the patient today.  Concerns regarding medicines are outlined above.  Tests Ordered: Orders Placed This Encounter  Procedures  . Basic  metabolic panel  . CBC  . EKG 12-Lead   Medication Changes: No orders of the defined types were placed in this encounter.   Signed, Richardson Dopp, PA-C  03/21/2020 3:30 PM    [facility] [place], [place], [place]  [address] Phone: [phone]; Fax: [phone]

## 2020-03-21 ENCOUNTER — Other Ambulatory Visit: Payer: Self-pay

## 2020-03-21 ENCOUNTER — Encounter: Payer: Self-pay | Admitting: Physician Assistant

## 2020-03-21 ENCOUNTER — Ambulatory Visit (INDEPENDENT_AMBULATORY_CARE_PROVIDER_SITE_OTHER): Payer: Medicare Other | Admitting: Physician Assistant

## 2020-03-21 VITALS — BP 126/58 | HR 45 | Ht 64.0 in | Wt 86.8 lb

## 2020-03-21 DIAGNOSIS — Z01810 Encounter for preprocedural cardiovascular examination: Secondary | ICD-10-CM

## 2020-03-21 DIAGNOSIS — I340 Nonrheumatic mitral (valve) insufficiency: Secondary | ICD-10-CM | POA: Diagnosis not present

## 2020-03-21 DIAGNOSIS — I5020 Unspecified systolic (congestive) heart failure: Secondary | ICD-10-CM | POA: Diagnosis not present

## 2020-03-21 DIAGNOSIS — I4819 Other persistent atrial fibrillation: Secondary | ICD-10-CM

## 2020-03-21 DIAGNOSIS — I484 Atypical atrial flutter: Secondary | ICD-10-CM

## 2020-03-21 DIAGNOSIS — I2510 Atherosclerotic heart disease of native coronary artery without angina pectoris: Secondary | ICD-10-CM

## 2020-03-21 NOTE — Patient Instructions (Signed)
Medication Instructions:  Your physician recommends that you continue on your current medications as directed. Please refer to the Current Medication list given to you today.  *If you need a refill on your cardiac medications before your next appointment, please call your pharmacy*  Lab Work: You will have labs drawn today: BMET/CBC  Testing/Procedures: Due to recent COVID-19 restrictions implemented by our local and state authorities and in an effort to keep both patients and staff as safe as possible, our hospital system requires COVID-19 testing prior to certain scheduled hospital procedures. Please go to [address] [place], [place] [address] on 03/31/20 at 10:55AM. This is a drive up testing site. You will not need to exit your vehicle.  You will not be billed at the time of testing but may receive a bill later depending on your insurance. You must agree to self-quarantine from the time of your testing until the procedure date on 04/03/20.  This should included staying home with ONLY the people you live with.  Avoid take-out, grocery store shopping or leaving the house for any non-emergent reason.  Failure to have your COVID-19 test done on the date and time you have been scheduled will result in cancellation of your procedure.  Please call our office at [phone] if you have any questions.  You are scheduled for a TEE Cardioversion on 04/03/20 with Dr. Meda Coffee.  Please arrive at the [facility] (Main Entrance A) at [facility]: [address] [place], [place] [address] at 7:30AM.   DIET: Nothing to eat or drink after midnight except a sip of water with medications (see medication instructions below)  Medication Instructions: Hold: Spironolactone the day of your procedure  Continue your anticoagulant: Eliquis You will need to continue your anticoagulant after your procedure until you are told by your  Provider that it is safe to stop  You must have a responsible person to drive  you home and stay in the waiting area during your procedure. Failure to do so could result in cancellation.  Bring your insurance cards.  *Special Note: Every effort is made to have your procedure done on time. Occasionally there are emergencies that occur at the hospital that may cause delays. Please be patient if a delay does occur.   Follow-Up: On 04/17/20 at 11:45AM with Richardson Dopp, PA-C

## 2020-03-22 LAB — BASIC METABOLIC PANEL
BUN/Creatinine Ratio: 17 (ref 12–28)
BUN: 13 mg/dL (ref 8–27)
CO2: 24 mmol/L (ref 20–29)
Calcium: 9 mg/dL (ref 8.7–10.3)
Chloride: 101 mmol/L (ref 96–106)
Creatinine, Ser: 0.76 mg/dL (ref 0.57–1.00)
GFR calc Af Amer: 83 mL/min/{1.73_m2} (ref >59)
GFR calc non Af Amer: 72 mL/min/{1.73_m2} (ref >59)
Glucose: 86 mg/dL (ref 65–99)
Potassium: 4.8 mmol/L (ref 3.5–5.2)
Sodium: 139 mmol/L (ref 134–144)

## 2020-03-22 LAB — CBC
Hematocrit: 34.9 % (ref 34.0–46.6)
Hemoglobin: 11.5 g/dL (ref 11.1–15.9)
MCH: 26.7 pg (ref 26.6–33.0)
MCHC: 33 g/dL (ref 31.5–35.7)
MCV: 81 fL (ref 79–97)
Platelets: 332 10*3/uL (ref 150–450)
RBC: 4.3 x10E6/uL (ref 3.77–5.28)
RDW: 15.7 % — ABNORMAL HIGH (ref 11.7–15.4)
WBC: 8.8 10*3/uL (ref 3.4–10.8)

## 2020-03-29 DIAGNOSIS — I480 Paroxysmal atrial fibrillation: Secondary | ICD-10-CM | POA: Diagnosis not present

## 2020-03-31 ENCOUNTER — Other Ambulatory Visit (HOSPITAL_COMMUNITY)
Admission: RE | Admit: 2020-03-31 | Discharge: 2020-03-31 | Disposition: A | Discharge status: Home / Self Care | Payer: Medicare Other | Source: Ambulatory Visit | Attending: Cardiology | Admitting: Cardiology

## 2020-03-31 DIAGNOSIS — Z01812 Encounter for preprocedural laboratory examination: Secondary | ICD-10-CM | POA: Insufficient documentation

## 2020-03-31 DIAGNOSIS — Z20822 Contact with and (suspected) exposure to covid-19: Secondary | ICD-10-CM | POA: Insufficient documentation

## 2020-04-01 LAB — SARS CORONAVIRUS 2 (TAT 6-24 HRS): SARS Coronavirus 2: NEGATIVE

## 2020-04-03 ENCOUNTER — Encounter (HOSPITAL_COMMUNITY): Payer: Self-pay | Admitting: Cardiology

## 2020-04-03 ENCOUNTER — Ambulatory Visit (HOSPITAL_COMMUNITY): Payer: Medicare Other | Admitting: Anesthesiology

## 2020-04-03 ENCOUNTER — Other Ambulatory Visit: Payer: Self-pay

## 2020-04-03 ENCOUNTER — Ambulatory Visit (HOSPITAL_COMMUNITY)
Admission: RE | Admit: 2020-04-03 | Discharge: 2020-04-03 | Disposition: A | Discharge status: Home / Self Care | Payer: Medicare Other | Attending: Cardiology | Admitting: Cardiology

## 2020-04-03 ENCOUNTER — Encounter (HOSPITAL_COMMUNITY)
Admission: RE | Disposition: A | Discharge status: Home / Self Care | Payer: Self-pay | Source: Home / Self Care | Attending: Cardiology

## 2020-04-03 ENCOUNTER — Ambulatory Visit (HOSPITAL_BASED_OUTPATIENT_CLINIC_OR_DEPARTMENT_OTHER): Payer: Medicare Other

## 2020-04-03 DIAGNOSIS — I2510 Atherosclerotic heart disease of native coronary artery without angina pectoris: Secondary | ICD-10-CM | POA: Diagnosis not present

## 2020-04-03 DIAGNOSIS — Z882 Allergy status to sulfonamides status: Secondary | ICD-10-CM | POA: Insufficient documentation

## 2020-04-03 DIAGNOSIS — I4891 Unspecified atrial fibrillation: Secondary | ICD-10-CM | POA: Diagnosis not present

## 2020-04-03 DIAGNOSIS — I509 Heart failure, unspecified: Secondary | ICD-10-CM | POA: Diagnosis not present

## 2020-04-03 DIAGNOSIS — Z7901 Long term (current) use of anticoagulants: Secondary | ICD-10-CM | POA: Diagnosis not present

## 2020-04-03 DIAGNOSIS — Z79899 Other long term (current) drug therapy: Secondary | ICD-10-CM | POA: Insufficient documentation

## 2020-04-03 DIAGNOSIS — I340 Nonrheumatic mitral (valve) insufficiency: Secondary | ICD-10-CM

## 2020-04-03 DIAGNOSIS — I272 Pulmonary hypertension, unspecified: Secondary | ICD-10-CM | POA: Insufficient documentation

## 2020-04-03 DIAGNOSIS — Z888 Allergy status to other drugs, medicaments and biological substances status: Secondary | ICD-10-CM | POA: Diagnosis not present

## 2020-04-03 DIAGNOSIS — I110 Hypertensive heart disease with heart failure: Secondary | ICD-10-CM | POA: Diagnosis not present

## 2020-04-03 DIAGNOSIS — Z79890 Hormone replacement therapy (postmenopausal): Secondary | ICD-10-CM | POA: Insufficient documentation

## 2020-04-03 DIAGNOSIS — I083 Combined rheumatic disorders of mitral, aortic and tricuspid valves: Secondary | ICD-10-CM | POA: Diagnosis not present

## 2020-04-03 DIAGNOSIS — Z8249 Family history of ischemic heart disease and other diseases of the circulatory system: Secondary | ICD-10-CM | POA: Diagnosis not present

## 2020-04-03 DIAGNOSIS — Z885 Allergy status to narcotic agent status: Secondary | ICD-10-CM | POA: Diagnosis not present

## 2020-04-03 DIAGNOSIS — I4819 Other persistent atrial fibrillation: Secondary | ICD-10-CM

## 2020-04-03 DIAGNOSIS — E039 Hypothyroidism, unspecified: Secondary | ICD-10-CM | POA: Diagnosis not present

## 2020-04-03 DIAGNOSIS — E785 Hyperlipidemia, unspecified: Secondary | ICD-10-CM | POA: Diagnosis not present

## 2020-04-03 HISTORY — PX: TEE WITHOUT CARDIOVERSION: SHX5443

## 2020-04-03 HISTORY — PX: CARDIOVERSION: SHX1299

## 2020-04-03 LAB — ECHO TEE
MV M vel: 5.14 m/s
MV Peak grad: 105.7 mmHg
Radius: 0.85 cm

## 2020-04-03 SURGERY — CARDIOVERSION
Anesthesia: Monitor Anesthesia Care

## 2020-04-03 MED ORDER — HYDROCORTISONE 1 % EX CREA: 1.0000 "application " | TOPICAL_CREAM | Freq: Three times a day (TID) | CUTANEOUS | Status: DC | PRN

## 2020-04-03 MED ORDER — PROPOFOL 10 MG/ML IV BOLUS
INTRAVENOUS | Status: DC | PRN
  Administered 2020-04-03: 30 mg via INTRAVENOUS
  Administered 2020-04-03: 40 mg via INTRAVENOUS

## 2020-04-03 MED ORDER — PROPOFOL 500 MG/50ML IV EMUL
INTRAVENOUS | Status: DC | PRN
  Administered 2020-04-03: 100 ug/kg/min via INTRAVENOUS

## 2020-04-03 MED ORDER — LIDOCAINE 2% (20 MG/ML) 5 ML SYRINGE
INTRAMUSCULAR | Status: DC | PRN
  Administered 2020-04-03: 40 mg via INTRAVENOUS

## 2020-04-03 MED ORDER — SODIUM CHLORIDE 0.9 % IV SOLN: INTRAVENOUS | Status: DC

## 2020-04-03 NOTE — Discharge Instructions (Signed)
Electrical Cardioversion Electrical cardioversion is the delivery of a jolt of electricity to restore a normal rhythm to the heart. A rhythm that is too fast or is not regular keeps the heart from pumping well. In this procedure, sticky patches or metal paddles are placed on the chest to deliver electricity to the heart from a device. This procedure may be done in an emergency if:  There is low or no blood pressure as a result of the heart rhythm.  Normal rhythm must be restored as fast as possible to protect the brain and heart from further damage.  It may save a life. This may also be a scheduled procedure for irregular or fast heart rhythms that are not immediately life-threatening. Tell a health care provider about:  Any allergies you have.  All medicines you are taking, including vitamins, herbs, eye drops, creams, and over-the-counter medicines.  Any problems you or family members have had with anesthetic medicines.  Any blood disorders you have.  Any surgeries you have had.  Any medical conditions you have.  Whether you are pregnant or may be pregnant. What are the risks? Generally, this is a safe procedure. However, problems may occur, including:  Allergic reactions to medicines.  A blood clot that breaks free and travels to other parts of your body.  The possible return of an abnormal heart rhythm within hours or days after the procedure.  Your heart stopping (cardiac arrest). This is rare. What happens before the procedure? Medicines  Your health care provider may have you start taking: ? Blood-thinning medicines (anticoagulants) so your blood does not clot as easily. ? Medicines to help stabilize your heart rate and rhythm.  Ask your health care provider about: ? Changing or stopping your regular medicines. This is especially important if you are taking diabetes medicines or blood thinners. ? Taking medicines such as aspirin and ibuprofen. These medicines can  thin your blood. Do not take these medicines unless your health care provider tells you to take them. ? Taking over-the-counter medicines, vitamins, herbs, and supplements. General instructions  Follow instructions from your health care provider about eating or drinking restrictions.  Plan to have someone take you home from the hospital or clinic.  If you will be going home right after the procedure, plan to have someone with you for 24 hours.  Ask your health care provider what steps will be taken to help prevent infection. These may include washing your skin with a germ-killing soap. What happens during the procedure?   An IV will be inserted into one of your veins.  Sticky patches (electrodes) or metal paddles may be placed on your chest.  You will be given a medicine to help you relax (sedative).  An electrical shock will be delivered. The procedure may vary among health care providers and hospitals. What can I expect after the procedure?  Your blood pressure, heart rate, breathing rate, and blood oxygen level will be monitored until you leave the hospital or clinic.  Your heart rhythm will be watched to make sure it does not change.  You may have some redness on the skin where the shocks were given. Follow these instructions at home:  Do not drive for 24 hours if you were given a sedative during your procedure.  Take over-the-counter and prescription medicines only as told by your health care provider.  Ask your health care provider how to check your pulse. Check it often.  Rest for 48 hours after the procedure or   as told by your health care provider.  Avoid or limit your caffeine use as told by your health care provider.  Keep all follow-up visits as told by your health care provider. This is important. Contact a health care provider if:  You feel like your heart is beating too quickly or your pulse is not regular.  You have a serious muscle cramp that does not go  away. Get help right away if:  You have discomfort in your chest.  You are dizzy or you feel faint.  You have trouble breathing or you are short of breath.  Your speech is slurred.  You have trouble moving an arm or leg on one side of your body.  Your fingers or toes turn cold or blue. Summary  Electrical cardioversion is the delivery of a jolt of electricity to restore a normal rhythm to the heart.  This procedure may be done right away in an emergency or may be a scheduled procedure if the condition is not an emergency.  Generally, this is a safe procedure.  After the procedure, check your pulse often as told by your health care provider. This information is not intended to replace advice given to you by your health care provider. Make sure you discuss any questions you have with your health care provider. Document Revised: 11/15/2018 Document Reviewed: 11/15/2018 Elsevier Patient Education  [place]. Transesophageal Echocardiogram Transesophageal echocardiogram (TEE) is a test that uses sound waves to take pictures of your heart. TEE is done by passing a flexible tube down the esophagus. The esophagus is the tube that carries food from the throat to the stomach. The pictures give detailed images of your heart. This can help your doctor see if there are problems with your heart. What happens before the procedure? Staying hydrated Follow instructions from your doctor about hydration, which may include:  Up to 3 hours before the procedure - you may continue to drink clear liquids, such as: ? Water. ? Clear fruit juice. ? Black coffee. ? Plain tea.  Eating and drinking Follow instructions from your doctor about eating and drinking, which may include:  8 hours before the procedure - stop eating heavy meals or foods such as meat, fried foods, or fatty foods.  6 hours before the procedure - stop eating light meals or foods, such as toast or cereal.  6 hours before  the procedure - stop drinking milk or drinks that contain milk.  3 hours before the procedure - stop drinking clear liquids. General instructions  You will need to take out any dentures or retainers.  Plan to have someone take you home from the hospital or clinic.  If you will be going home right after the procedure, plan to have someone with you for 24 hours.  Ask your doctor about: ? Changing or stopping your normal medicines. This is important if you take diabetes medicines or blood thinners. ? Taking over-the-counter medicines, vitamins, herbs, and supplements. ? Taking medicines such as aspirin and ibuprofen. These medicines can thin your blood. Do not take these medicines unless your doctor tells you to take them. What happens during the procedure?  To lower your risk of infection, your doctors will wash or clean their hands.  An IV will be put into one of your veins.  You will be given a medicine to help you relax (sedative).  A medicine may be sprayed or gargled. This numbs the back of your throat.  Your blood pressure, heart rate,  and breathing will be watched.  You may be asked to lay on your left side.  A bite block will be placed in your mouth. This keeps you from biting the tube.  The tip of the TEE probe will be placed into the back of your mouth.  You will be asked to swallow.  Your doctor will take pictures of your heart.  The probe and bite block will be taken out. The procedure may vary among doctors and hospitals. What happens after the procedure?   Your blood pressure, heart rate, breathing rate, and blood oxygen level will be watched until the medicines you were given have worn off.  When you first wake up, your throat may feel sore and numb. This will get better over time. You will not be allowed to eat or drink until the numbness has gone away.  Do not drive for 24 hours if you were given a medicine to help you relax. Summary  TEE is a test that  uses sound waves to take pictures of your heart.  You will be given a medicine to help you relax.  Do not drive for 24 hours if you were given a medicine to help you relax. This information is not intended to replace advice given to you by your health care provider. Make sure you discuss any questions you have with your health care provider. Document Revised: 01/01/2018 Document Reviewed: 07/16/2016 Elsevier Patient Education  [place].

## 2020-04-03 NOTE — Interval H&P Note (Signed)
History and Physical Interval Note:  04/03/2020 7:48 AM  [name]  has presented today for surgery, with the diagnosis of AFIB.  The various methods of treatment have been discussed with the patient and family. After consideration of risks, benefits and other options for treatment, the patient has consented to  Procedure(s): CARDIOVERSION (N/A) TRANSESOPHAGEAL ECHOCARDIOGRAM (TEE) (N/A) as a surgical intervention.  The patient's history has been reviewed, patient examined, no change in status, stable for surgery.  I have reviewed the patient's chart and labs.  Questions were answered to the patient's satisfaction.     Ena Dawley

## 2020-04-03 NOTE — CV Procedure (Signed)
     Transesophageal Echocardiogram Note  [name] [id number] 01/26/1933  Procedure: Transesophageal Echocardiogram Indications: atrial fibrillation, severe mitral regurgitation  Procedure Details Consent: Obtained Time Out: Verified patient identification, verified procedure, site/side was marked, verified correct patient position, special equipment/implants available, Radiology Safety Procedures followed,  medications/allergies/relevent history reviewed, required imaging and test results available.  Performed  Medications: During this procedure the patient is administered a total of Propofol 150 mg IV and Lidocaine 40 mg IV administered by anesthesia staff for deep sedation.  The patient's heart rate, blood pressure, and oxygen saturation are monitored continuously during the procedure. The period of conscious sedation is 40 minutes, of which I was present face-to-face 100% of this time.    1. Severe mitral regurgitation, prolapse of the P1,2 and A2 scallopes,  multiple jets, one dominant anteriorly directed. PISA radius 1.0, ERO 0.48  cm2, Reg Vol 84 ml. Moderate pulmonary hypertension, RVSP 47 mmHg.  2. Left ventricular ejection fraction, by estimation, is 50 to 55%. The  left ventricle has low normal function. The left ventricle has no regional  wall motion abnormalities. Left ventricular diastolic function could not  be evaluated.  3. Right ventricular systolic function is normal. The right ventricular  size is normal. There is moderately elevated pulmonary artery systolic  pressure. The estimated right ventricular systolic pressure is 21.3 mmHg.  4. Left atrial size was severely dilated. No left atrial/left atrial  appendage thrombus was detected. The LAA emptying velocity was 40 cm/s.  5. Right atrial size was severely dilated.  6. The mitral valve is degenerative. No evidence of mitral valve  regurgitation. No evidence of mitral stenosis. There is moderate  prolapse  of both leaflets of the mitral valve.  7. Tricuspid valve regurgitation is moderate.  8. The aortic valve is tricuspid. Aortic valve regurgitation is moderate  to severe. No aortic stenosis is present.  9. The inferior vena cava is normal in size with greater than 50%  respiratory variability, suggesting right atrial pressure of 3 mmHg.   Conclusion(s)/Recommendation(s): No LA/LAA thrombus identified. Successful  cardioversion performed with restoration of normal sinus rhythm.   Complications: No apparent complications Patient did tolerate procedure well.  Ena Dawley, MD, [facility] 04/03/2020, 10:22 AM        Cardioversion Note  [name] [id number] 1932-12-29  Procedure: DC Cardioversion Indications: atrial fibrillation  Procedure Details Consent: Obtained Time Out: Verified patient identification, verified procedure, site/side was marked, verified correct patient position, special equipment/implants available, Radiology Safety Procedures followed,  medications/allergies/relevent history reviewed, required imaging and test results available.  Performed  During this procedure the patient is administered a total of Propofol 150 mg IV and Lidocaine 40 mg IV administered by anesthesia staff for deep sedation.  The patient's heart rate, blood pressure, and oxygen saturation are monitored continuously during the procedure. The period of conscious sedation is 40 minutes, of which I was present face-to-face 100% of this time.  Synchronous cardioversion was performed at 150 joules.  The cardioversion was successful with restoration of sinus rhythm but very frequent ectopy - PACs in a pattern of bigeminy and frequent short runs of atrial tachycardia with ventricular rate 629 BPM.   Complications: No apparent complications Patient did tolerate procedure well.   Ena Dawley, MD, [facility] 04/03/2020, 10:22 AM

## 2020-04-03 NOTE — Anesthesia Preprocedure Evaluation (Addendum)
Anesthesia Evaluation  Patient identified by MRN, date of birth, ID band Patient awake    Reviewed: Patient's Chart, lab work & pertinent test results, reviewed documented beta blocker date and time   Airway Mallampati: II  TM Distance: >3 FB Neck ROM: Full    Dental  (+) Teeth Intact   Pulmonary    Pulmonary exam normal        Cardiovascular hypertension, Pt. on medications and Pt. on home beta blockers + CAD, + CABG and + Peripheral Vascular Disease   Rhythm:Regular Rate:Normal     Neuro/Psych negative neurological ROS  negative psych ROS   GI/Hepatic GERD  Medicated,  Endo/Other  Hypothyroidism   Renal/GU stones  negative genitourinary   Musculoskeletal  (+) Arthritis , Osteoarthritis,  Fibromyalgia -  Abdominal (+)  Abdomen: soft. Bowel sounds: normal.  Peds  Hematology  (+) anemia ,   Anesthesia Other Findings   Reproductive/Obstetrics                             Anesthesia Physical Anesthesia Plan  ASA: III  Anesthesia Plan: General   Post-op Pain Management:    Induction: Intravenous  PONV Risk Score and Plan: 2 and 3 and Propofol infusion and Treatment may vary due to age or medical condition  Airway Management Planned: Simple Face Mask, Natural Airway and Nasal Cannula  Additional Equipment: None  Intra-op Plan:   Post-operative Plan:   Informed Consent: I have reviewed the patients History and Physical, chart, labs and discussed the procedure including the risks, benefits and alternatives for the proposed anesthesia with the patient or authorized representative who has indicated his/her understanding and acceptance.     Dental advisory given  Plan Discussed with: CRNA  Anesthesia Plan Comments: (Lab Results      Component                Value               Date                      WBC                      8.8                 03/21/2020                HGB                       11.5                03/21/2020                HCT                      34.9                03/21/2020                MCV                      81                  03/21/2020                PLT  332                 03/21/2020           ECHO 10/21: 1. Left ventricular ejection fraction, by estimation, is 45 to 50%. The  left ventricle has mildly decreased function. The left ventricle  demonstrates global hypokinesis with septal-lateral dyssynchrony  consistent with LBBB. Left ventricular diastolic  parameters are indeterminate.  2. Right ventricular systolic function is normal. The right ventricular  size is normal. There is normal pulmonary artery systolic pressure. The  estimated right ventricular systolic pressure is 83.4 mmHg.  3. Left atrial size was mildly dilated.  4. Right atrial size was mildly dilated.  5. The mitral valve is abnormal. Restriction of posterior leaflet with  probably severe, eccentric anteriorly-directed mitral valve regurgitation.  No evidence of mitral stenosis.  6. The aortic valve is tricuspid. Aortic valve regurgitation is mild. No  aortic stenosis is present.  7. The inferior vena cava is normal in size with greater than 50%  respiratory variability, suggesting right atrial pressure of 3 mmHg.)       Anesthesia Quick Evaluation

## 2020-04-03 NOTE — Transfer of Care (Signed)
Immediate Anesthesia Transfer of Care Note  Patient: [name]  Procedure(s) Performed: CARDIOVERSION (N/A ) TRANSESOPHAGEAL ECHOCARDIOGRAM (TEE) (N/A )  Patient Location: Endoscopy Unit  Anesthesia Type:MAC  Level of Consciousness: drowsy and patient cooperative  Airway & Oxygen Therapy: Patient Spontanous Breathing and Patient connected to nasal cannula oxygen  Post-op Assessment: Report given to RN and Post -op Vital signs reviewed and stable  Post vital signs: Reviewed and stable  Last Vitals:  Vitals Value Taken Time  BP 88/25 04/03/20 1004  Temp    Pulse 86 04/03/20 1004  Resp 20 04/03/20 1004  SpO2 100 % 04/03/20 1004  Vitals shown include unvalidated device data.  Last Pain:  Vitals:   04/03/20 0803  TempSrc: Oral  PainSc: 0-No pain         Complications: No complications documented.

## 2020-04-03 NOTE — Progress Notes (Signed)
  Echocardiogram Echocardiogram Transesophageal has been performed.  [name] 04/03/2020, 10:04 AM

## 2020-04-03 NOTE — Anesthesia Postprocedure Evaluation (Signed)
Anesthesia Post Note  Patient: [name]  Procedure(s) Performed: CARDIOVERSION (N/A ) TRANSESOPHAGEAL ECHOCARDIOGRAM (TEE) (N/A )     Patient location during evaluation: Endoscopy Anesthesia Type: General Level of consciousness: awake and alert Pain management: pain level controlled Vital Signs Assessment: post-procedure vital signs reviewed and stable Respiratory status: spontaneous breathing, nonlabored ventilation, respiratory function stable and patient connected to nasal cannula oxygen Cardiovascular status: blood pressure returned to baseline and stable Postop Assessment: no apparent nausea or vomiting Anesthetic complications: no   No complications documented.  Last Vitals:  Vitals:   04/03/20 1010 04/03/20 1020  BP: (!) 113/33 (!) 143/41  Pulse: (!) 47 63  Resp: (!) 24 14  Temp:    SpO2: 100% 97%    Last Pain:  Vitals:   04/03/20 1020  TempSrc:   PainSc: 0-No pain   Pain Goal:                   March Rummage Sherise Geerdes

## 2020-04-04 ENCOUNTER — Encounter (HOSPITAL_COMMUNITY): Payer: Self-pay | Admitting: Cardiology

## 2020-04-16 NOTE — Progress Notes (Signed)
Cardiology Office Note:    Date:  04/17/2020   ID:  [name], DOB 06-12-1932, MRN [id number]  PCP:  [name], MD  [facility] Cardiologist:  Sherren Mocha, MD   [facility] Electrophysiologist:  None   Referring MD: [name], MD   Chief Complaint:  Follow-up (AFib, CHF)    Patient Profile:    [name] is a 86 y.o. female with:   Coronary artery disease  ? S/p CABG in 2007  Chronic anemia  Mitral regurgitation, mod to severe ? Probably 2/2 ischemic MR w restriction of post leaflet  HFmrEF (heart failure with mildly reduced ejection fraction)  Carotid artery disease  Paroxysmal atrial fibrillation   Lumbar HNP   Hypertension   Hyperlipidemia   Hypothyroidism   Prior CV studies: TEE 04/03/20 Severe MR, mod pulm hypertension (RVSP 47), EF 50-55, no RWMA, normal RVSF, severe BAE, mod TR, mod to severe AI  Echocardiogram 02/16/20 EF 45-50, normal RVSF, RVSP 31.1, mild BAE, restriction of post MV leaflet, severe eccentric ant direted MR, mild AI  Carotid US 12/29/17 Bilat ICA 1-39  History of Present Illness:    [name] was last seen in 11/21.  She was felt to be symptomatic from atrial fibrillation and was set up for DCCV.  This was performed on 04/03/20 with restoration of normal sinus rhythm.  She returns for f/u. She is here with her husband, [name]. She felt significantly better the day after her cardioversion. However, since then, she has gradually felt more fatigued. She had an episode of chest discomfort yesterday that lasted for couple of minutes. She has not had a recurrence. She really has not had exertional symptoms. She does get short of breath with some activities. She sleeps on her side at night. She has not had significant swelling. She has not had syncope.      Past Medical History:  Diagnosis Date  . Allergy   . Anemia, pernicious   . Arthritis    bursitis   . Atrophic gastritis   . B12 deficiency    . CAD (coronary artery disease)   . Carotid artery stenosis   . Cataract   . Diarrhea    on and off for months  . Family history of adverse reaction to anesthesia    extreme nausea brother and sister  . Fibromyalgia   . Gallbladder polyp   . GERD (gastroesophageal reflux disease)   . Heart murmur   . History of hiatal hernia   . History of kidney stones   . HLD (hyperlipidemia)   . HTN (hypertension)   . Hypothyroidism   . Irritable bowel syndrome (IBS)   . Median arcuate ligament syndrome ([place])   . Osteoporosis   . PVD (peripheral vascular disease) ([place])   . Tubular adenoma of colon 08/2013   with focal high grade dysplasia    Current Medications: Current Meds  Medication Sig  . acetaminophen (TYLENOL) 500 MG tablet Take 500 mg by mouth every 6 (six) hours as needed (pain).  Marland Kitchen atorvastatin (LIPITOR) 80 MG tablet TAKE 1 TABLET BY MOUTH DAILY AT BEDTIME  . Calcium Carbonate-Vitamin D (CALTRATE 600+D PO) Take 1 tablet by mouth daily with lunch.   . cyanocobalamin (,VITAMIN B-12,) 1000 MCG/ML injection Inject 1,000 mcg into the muscle every 30 (thirty) days.  Marland Kitchen ELIQUIS 2.5 MG TABS tablet TAKE 1 TABLET BY MOUTH TWICE DAILY  . fluticasone (FLONASE) 50 MCG/ACT nasal spray Place 1 spray into both  nostrils daily as needed for allergies or rhinitis.  Marland Kitchen gabapentin (NEURONTIN) 100 MG capsule Take 1 capsule (100 mg total) by mouth 2 (two) times daily.  . hyoscyamine (LEVSIN SL) 0.125 MG SL tablet Place 1 tablet (0.125 mg total) under the tongue every 6 (six) hours as needed.  . lactase (LACTAID) 3000 units tablet Take 3,000 Units by mouth 3 (three) times daily with meals as needed (lactose Intolerance).   Marland Kitchen levothyroxine (SYNTHROID, LEVOTHROID) 50 MCG tablet Take 50 mcg by mouth every evening.   . Lidocaine 4 % OINT Apply 1 application topically 3 (three) times daily as needed (pain). Roll-On (otc)  . Lidocaine 4 % PTCH Place 1 patch onto the skin daily as needed (pain.).   Marland Kitchen losartan  (COZAAR) 100 MG tablet TAKE 1 TABLET BY MOUTH DAILY  . metoprolol succinate (TOPROL-XL) 50 MG 24 hr tablet Take 1 tablet (50 mg total) by mouth daily. Take with or immediately following a meal.  . Multiple Vitamins-Minerals (PRESERVISION AREDS 2 PO) Take 1 tablet by mouth 2 (two) times daily.  Vladimir Faster Glycol-Propyl Glycol (SYSTANE FREE OP) Place 1 drop into both eyes 3 (three) times daily as needed (dry/irritated eyes.).  Marland Kitchen saccharomyces boulardii (FLORASTOR) 250 MG capsule Take 250 mg by mouth 2 (two) times daily.  Marland Kitchen spironolactone (ALDACTONE) 25 MG tablet TAKE 1 TABLET BY MOUTH ONCE DAILY  . sucralfate (CARAFATE) 1 GM/10ML suspension TAKE 10 MLS (1 G TOTAL) BY MOUTH 2 (TWO) TIMES DAILY.  Marland Kitchen zoledronic acid (RECLAST) 5 MG/100ML SOLN injection      Allergies:   Actos [pioglitazone], Sulfonamide derivatives, Adhesive [tape], and Codeine   Social History   Tobacco Use  . Smoking status: Never Smoker  . Smokeless tobacco: Never Used  Vaping Use  . Vaping Use: Never used  Substance Use Topics  . Alcohol use: No    Alcohol/week: 0.0 standard drinks  . Drug use: No     Family Hx: The patient's family history includes Breast cancer in an other family member; Cholelithiasis in her mother; Colon cancer in her brother; Diabetes in her sister; Heart disease in her mother; Lung cancer in her father and sister; Prostate cancer in her brother; Stomach cancer in her cousin. There is no history of Esophageal cancer or Rectal cancer.  ROS   EKGs/Labs/Other Test Reviewed:    EKG:  EKG is   ordered today.  The ekg ordered today demonstrates atrial fibrillation, HR 75, right axis deviation, IVCD, nonspecific ST-T wave changes, QTC 464, no change from prior tracing  Recent Labs: 10/27/2019: ALT 14 03/21/2020: BUN 13; Creatinine, Ser 0.76; Hemoglobin 11.5; Platelets 332; Potassium 4.8; Sodium 139   Recent Lipid Panel Lab Results  Component Value Date/Time   CHOL 135 11/18/2016 12:00 AM   TRIG 54  11/18/2016 12:00 AM   TRIG 85 02/05/2006 08:18 AM   HDL 69.20 11/22/2014 09:19 AM   CHOLHDL 2 11/22/2014 09:19 AM   LDLCALC 66 11/22/2014 09:19 AM      Risk Assessment/Calculations:     CHA2DS2-VASc Score = 6  This indicates a 9.7% annual risk of stroke. The patient's score is based upon: CHF History: Yes HTN History: Yes Diabetes History: No Stroke History: No Vascular Disease History: Yes Age Score: 2 Gender Score: 1     Physical Exam:    VS:  BP 140/72   Pulse 75   Ht 5\' 4"  (1.626 m)   Wt 90 lb (40.8 kg)   SpO2 98%   BMI  15.45 kg/m     Wt Readings from Last 3 Encounters:  04/17/20 90 lb (40.8 kg)  04/03/20 86 lb (39 kg)  03/21/20 86 lb 12.8 oz (39.4 kg)     Constitutional:      Appearance: Not in distress. Frail.  Neck:     Vascular: No JVR.  Pulmonary:     Breath sounds: No wheezing. No rales.  Cardiovascular:     Normal rate. Irregularly irregular rhythm. Normal S1. Normal S2.     Murmurs: There is a grade 2/6 systolic murmur at the LLSB.  Edema:    Peripheral edema absent.  Abdominal:     Palpations: Abdomen is soft.  Skin:    General: Skin is warm and dry.  Neurological:     General: No focal deficit present.     Mental Status: Alert and oriented to person, place and time.       ASSESSMENT & PLAN:    1. Persistent atrial fibrillation ([place]) The patient noted significant improvement in her symptoms after cardioversion. She is back in atrial fibrillation today and has had recurrent symptoms. She clearly feels better in normal sinus rhythm. I reviewed options with her for antiarrhythmic drug therapy. These include amiodarone and dofetilide. I have recommended amiodarone to her today. Her husband already takes this medication. I reviewed this with Dr. Burt Knack as well. He agreed with initiating amiodarone. It will be difficult to keep her in normal sinus rhythm with her severe mitral regurgitation. Intervention for her mitral regurgitation may be needed  at some point in the near future.  -Start amiodarone 400 mg twice daily  -Plan reducing dose to 200 mg daily after cardioversion  -Plan repeat cardioversion in 2 weeks  -Nurse visit prior to cardioversion to check EKG and obtain BMET  -She knows to contact us if she cannot tolerate high-dose amiodarone  2. HFmrEF (heart failure with mildly reduced ejection fraction)   Overall, volume status is stable. Continue current dose of losartan, spironolactone, metoprolol succinate.  3. Nonrheumatic mitral valve regurgitation Severe mitral regurgitation by recent TEE. TEE also mentions moderate to severe aortic insufficiency. She has had mild aortic insufficiency in the past and she does not really have a significant murmur on exam to suggest AI. She will see Dr. Burt Knack back in follow-up after her cardioversion for continued management of her mild regurgitation.  4. Coronary artery disease involving native coronary artery of native heart without angina pectoris History of CABG in 2007. She did have chest discomfort recently but this was atypical for angina. Continue current dose of atorvastatin, metoprolol succinate. She knows to contact us if her symptoms should change or worsen.    Shared Decision Making/Informed Consent The risks (stroke, cardiac arrhythmias rarely resulting in the need for a temporary or permanent pacemaker, skin irritation or burns and complications associated with conscious sedation including aspiration, arrhythmia, respiratory failure and death), benefits (restoration of normal sinus rhythm) and alternatives of a direct current cardioversion were explained in detail to [name] and she agrees to proceed.    Dispo:  Return in about 2 weeks (around 05/01/2020) for nurse visit for EKG, BMET.   Medication Adjustments/Labs and Tests Ordered: Current medicines are reviewed at length with the patient today.  Concerns regarding medicines are outlined above.  Tests Ordered: Orders  Placed This Encounter  Procedures  . EKG 12-Lead   Medication Changes: Meds ordered this encounter  Medications  . amiodarone (PACERONE) 400 MG tablet    Sig: Take 1  tablet (400 mg total) by mouth 2 (two) times daily.    Dispense:  180 tablet    Refill:  1    Signed, Richardson Dopp, PA-C  04/17/2020 1:52 PM    [facility] [address], [place], [place]  [address] Phone: [phone]; Fax: [phone]

## 2020-04-17 ENCOUNTER — Other Ambulatory Visit: Payer: Self-pay

## 2020-04-17 ENCOUNTER — Encounter: Payer: Self-pay | Admitting: Physician Assistant

## 2020-04-17 ENCOUNTER — Ambulatory Visit: Payer: Medicare Other | Admitting: Physician Assistant

## 2020-04-17 VITALS — BP 140/72 | HR 75 | Ht 64.0 in | Wt 90.0 lb

## 2020-04-17 DIAGNOSIS — I5020 Unspecified systolic (congestive) heart failure: Secondary | ICD-10-CM | POA: Diagnosis not present

## 2020-04-17 DIAGNOSIS — I340 Nonrheumatic mitral (valve) insufficiency: Secondary | ICD-10-CM

## 2020-04-17 DIAGNOSIS — I4819 Other persistent atrial fibrillation: Secondary | ICD-10-CM | POA: Diagnosis not present

## 2020-04-17 DIAGNOSIS — I2510 Atherosclerotic heart disease of native coronary artery without angina pectoris: Secondary | ICD-10-CM | POA: Diagnosis not present

## 2020-04-17 MED ORDER — AMIODARONE HCL 400 MG PO TABS: 400.0000 mg | ORAL_TABLET | Freq: Two times a day (BID) | ORAL | 1 refills | Status: DC

## 2020-04-17 NOTE — Patient Instructions (Signed)
Medication Instructions:  Your physician has recommended you make the following change in your medication:   1) Start Amiodarone 400 mg, 1 tablet by mouth twice a day  *If you need a refill on your cardiac medications before your next appointment, please call your pharmacy*  Lab Work: None ordered today  If you have labs (blood work) drawn today and your tests are completely normal, you will receive your results only by:  [facility] (if you have MyChart) OR  A paper copy in the mail If you have any lab test that is abnormal or we need to change your treatment, we will call you to review the results.  Testing/Procedures: You are scheduled for a Cardioversion on 05/01/20 with Dr. Gardiner Rhyme at 11:00AM.  Please arrive at the [facility] (Main Entrance A) at [facility]: [place], [place] [address] at 10:00AM.  DIET: Nothing to eat or drink after midnight except a sip of water with medications (see medication instructions below)  Medication Instructions:  Continue your anticoagulant: Eliquis You will need to continue your anticoagulant after your procedure until you are told by your  Provider that it is safe to stop  You must have a responsible person to drive you home and stay in the waiting area during your procedure. Failure to do so could result in cancellation.  Bring your insurance cards.  *Special Note: Every effort is made to have your procedure done on time. Occasionally there are emergencies that occur at the hospital that may cause delays. Please be patient if a delay does occur.   Due to recent COVID-19 restrictions implemented by our local and state authorities and in an effort to keep both patients and staff as safe as possible, our hospital system requires COVID-19 testing prior to certain scheduled hospital procedures. Please go to [place]. [place], [place] [address] on 04/30/20 at 2:55PM. This is a drive up testing site. You will not need to  exit your vehicle.  You will not be billed at the time of testing but may receive a bill later depending on your insurance.You must agree to self-quarantine from the time of your testing until the procedure date on 05/01/20. This should included staying home with ONLY the people you live with.  Avoid take-out, grocery store shopping or leaving the house for any non-emergent reason. Failure to have your COVID-19 test done on the date and time you have been scheduled will result in cancellation of your procedure. Please call our office at [phone] if you have any questions.  Follow-Up: At [facility], you and your health needs are our priority.  As part of our continuing mission to provide you with exceptional heart care, we have created designated Provider Care Teams.  These Care Teams include your primary Cardiologist (physician) and Advanced Practice Providers (APPs -  Physician Assistants and Nurse Practitioners) who all work together to provide you with the care you need, when you need it.  Your next appointment:   2 week(s) after cardioversion  The format for your next appointment:   In Person  Provider:   Sherren Mocha, MD

## 2020-04-23 ENCOUNTER — Telehealth: Payer: Self-pay | Admitting: Physician Assistant

## 2020-04-23 NOTE — Telephone Encounter (Signed)
Pt c/o medication issue:  1. Name of Medication: amiodarone (PACERONE) 400 MG tablet  2. How are you currently taking this medication (dosage and times per day)? 400 mg 2x daily   3. Are you having a reaction (difficulty breathing--STAT)? Nausea, diarrhea, no appetite, no energy  4. What is your medication issue? Patient was told at her appointment that her medication may need adjusted.   Patient can not continue to keep feeling like this. Please advise

## 2020-04-23 NOTE — Telephone Encounter (Signed)
Called the patient regarding her issues with Amiodarone. The patient states she started taking it on 12/22 and has been experiencing symptoms ever since. She reports feeling fatigued with no energy, no appetite and has been having bouts of diarrhea. She is hoping that her dose can be decreased as she has a sensitive stomach. Patient denies any other symptoms and states she has not been around anyone who has been sick recently. Will route to SW for advisement.

## 2020-04-23 NOTE — Telephone Encounter (Signed)
Decrease dose to 200 mg twice daily. Call if symptoms persist. Tereso Newcomer, PA-C    04/23/2020 11:43 AM

## 2020-04-23 NOTE — Telephone Encounter (Signed)
Called the patient to make her aware of the following:  Decrease dose to 200 mg twice daily. Call if symptoms persist. Tereso Newcomer, PA-C    04/23/2020 11:43 AM    She verbalized understanding and is aware to call back with any new issues.

## 2020-04-30 ENCOUNTER — Other Ambulatory Visit: Payer: Self-pay

## 2020-04-30 ENCOUNTER — Other Ambulatory Visit: Payer: Medicare Other

## 2020-04-30 ENCOUNTER — Ambulatory Visit: Payer: Medicare Other

## 2020-04-30 ENCOUNTER — Ambulatory Visit (HOSPITAL_COMMUNITY): Payer: Medicare Other | Admitting: Anesthesiology

## 2020-04-30 ENCOUNTER — Other Ambulatory Visit (HOSPITAL_COMMUNITY)
Admission: RE | Admit: 2020-04-30 | Discharge: 2020-04-30 | Disposition: A | Discharge status: Home / Self Care | Payer: Medicare Other | Source: Ambulatory Visit | Attending: Cardiology | Admitting: Cardiology

## 2020-04-30 ENCOUNTER — Other Ambulatory Visit: Payer: Self-pay | Admitting: Family Medicine

## 2020-04-30 DIAGNOSIS — Z20822 Contact with and (suspected) exposure to covid-19: Secondary | ICD-10-CM | POA: Insufficient documentation

## 2020-04-30 DIAGNOSIS — Z01812 Encounter for preprocedural laboratory examination: Secondary | ICD-10-CM | POA: Insufficient documentation

## 2020-04-30 DIAGNOSIS — I4819 Other persistent atrial fibrillation: Secondary | ICD-10-CM

## 2020-04-30 LAB — BASIC METABOLIC PANEL
BUN/Creatinine Ratio: 23 (ref 12–28)
BUN: 23 mg/dL (ref 8–27)
CO2: 29 mmol/L (ref 20–29)
Calcium: 8.7 mg/dL (ref 8.7–10.3)
Chloride: 104 mmol/L (ref 96–106)
Creatinine, Ser: 1.02 mg/dL — ABNORMAL HIGH (ref 0.57–1.00)
GFR calc Af Amer: 58 mL/min/{1.73_m2} — ABNORMAL LOW (ref >59)
GFR calc non Af Amer: 50 mL/min/{1.73_m2} — ABNORMAL LOW (ref >59)
Glucose: 119 mg/dL — ABNORMAL HIGH (ref 65–99)
Potassium: 4.4 mmol/L (ref 3.5–5.2)
Sodium: 139 mmol/L (ref 134–144)

## 2020-04-30 LAB — SARS CORONAVIRUS 2 (TAT 6-24 HRS): SARS Coronavirus 2: NEGATIVE

## 2020-04-30 LAB — CBC
Hematocrit: 33.1 % — ABNORMAL LOW (ref 34.0–46.6)
Hemoglobin: 10.8 g/dL — ABNORMAL LOW (ref 11.1–15.9)
MCH: 27.1 pg (ref 26.6–33.0)
MCHC: 32.6 g/dL (ref 31.5–35.7)
MCV: 83 fL (ref 79–97)
Platelets: 204 10*3/uL (ref 150–450)
RBC: 3.99 x10E6/uL (ref 3.77–5.28)
RDW: 18 % — ABNORMAL HIGH (ref 11.7–15.4)
WBC: 7 10*3/uL (ref 3.4–10.8)

## 2020-05-01 ENCOUNTER — Ambulatory Visit (HOSPITAL_COMMUNITY)
Admission: RE | Admit: 2020-05-01 | Discharge: 2020-05-01 | Disposition: A | Discharge status: Home / Self Care | Payer: Medicare Other | Attending: Cardiology | Admitting: Cardiology

## 2020-05-01 ENCOUNTER — Encounter (HOSPITAL_COMMUNITY)
Admission: RE | Disposition: A | Discharge status: Home / Self Care | Payer: Self-pay | Source: Home / Self Care | Attending: Cardiology

## 2020-05-01 ENCOUNTER — Other Ambulatory Visit: Payer: Self-pay | Admitting: Cardiology

## 2020-05-01 DIAGNOSIS — Z538 Procedure and treatment not carried out for other reasons: Secondary | ICD-10-CM | POA: Diagnosis not present

## 2020-05-01 DIAGNOSIS — I4819 Other persistent atrial fibrillation: Secondary | ICD-10-CM | POA: Diagnosis not present

## 2020-05-01 SURGERY — CANCELLED PROCEDURE

## 2020-05-01 MED ORDER — HYDROCORTISONE 1 % EX CREA: 1.0000 "application " | TOPICAL_CREAM | Freq: Three times a day (TID) | CUTANEOUS | Status: DC | PRN

## 2020-05-01 MED ORDER — METOPROLOL SUCCINATE ER 25 MG PO TB24: 25.0000 mg | ORAL_TABLET | Freq: Every day | ORAL | 3 refills | Status: DC

## 2020-05-01 NOTE — Progress Notes (Signed)
Procedure cancelled-pt self-converted. Dr. Bjorn Pippin at bedside. EKG reviewed by Dr. Bjorn Pippin.  Education provided to patient.  Patient verbalized understanding.

## 2020-05-01 NOTE — H&P (Signed)
Patient presented for cardioversion today.  Found to be in sinus bradycardia with rate 40s.  Cardioversion cancelled.  Home metoprolol dose decreased from 50 to 25 mg daily given sinus rate in 40s.

## 2020-05-01 NOTE — Anesthesia Preprocedure Evaluation (Deleted)
Anesthesia Evaluation    Reviewed: Allergy & Precautions, Patient's Chart, lab work & pertinent test results, reviewed documented beta blocker date and time   Airway        Dental   Pulmonary neg pulmonary ROS,           Cardiovascular hypertension, Pt. on medications and Pt. on home beta blockers + CAD, + Peripheral Vascular Disease and +CHF (LVEF 50-55%, moderate pulmonary HTN)  + dysrhythmias (eliquis) Atrial Fibrillation + Valvular Problems/Murmurs (severe MR, mod AI, mod TR) MR and AI   Echo 04/03/20: 1. Severe mitral regurgitation, prolapse of the P1,2 and A2 scallopes,  multiple jets, one dominant anteriorly directed. PISA radius 1.0, ERO 0.48  cm2, Reg Vol 84 ml. Moderate pulmonary hypertension, RVSP 47 mmHg.  2. Left ventricular ejection fraction, by estimation, is 50 to 55%. The  left ventricle has low normal function. The left ventricle has no regional  wall motion abnormalities. Left ventricular diastolic function could not  be evaluated.  3. Right ventricular systolic function is normal. The right ventricular  size is normal. There is moderately elevated pulmonary artery systolic  pressure. The estimated right ventricular systolic pressure is 46.6 mmHg.  4. Left atrial size was severely dilated. No left atrial/left atrial  appendage thrombus was detected. The LAA emptying velocity was 40 cm/s.  5. Right atrial size was severely dilated.  6. The mitral valve is degenerative. No evidence of mitral valve  regurgitation. No evidence of mitral stenosis. There is moderate prolapse  of both leaflets of the mitral valve.  7. Tricuspid valve regurgitation is moderate.  8. The aortic valve is tricuspid. Aortic valve regurgitation is moderate  to severe. No aortic stenosis is present.  9. The inferior vena cava is normal in size with greater than 50%  respiratory variability, suggesting right atrial pressure of 3 mmHg.    Neuro/Psych negative neurological ROS  negative psych ROS   GI/Hepatic Neg liver ROS, hiatal hernia, GERD  ,  Endo/Other  Hypothyroidism   Renal/GU Renal InsufficiencyRenal diseaseCr 1.02  negative genitourinary   Musculoskeletal  (+) Arthritis , Osteoarthritis,  Fibromyalgia -  Abdominal   Peds  Hematology  (+) Blood dyscrasia, anemia , hct 33.1   Anesthesia Other Findings   Reproductive/Obstetrics negative OB ROS                             Anesthesia Physical Anesthesia Plan  ASA: IV  Anesthesia Plan: General   Post-op Pain Management:    Induction: Intravenous  PONV Risk Score and Plan: 3 and TIVA and Treatment may vary due to age or medical condition  Airway Management Planned: Natural Airway and Mask  Additional Equipment: None  Intra-op Plan:   Post-operative Plan:   Informed Consent:   Plan Discussed with:   Anesthesia Plan Comments:         Anesthesia Quick Evaluation

## 2020-05-08 ENCOUNTER — Other Ambulatory Visit: Payer: Self-pay | Admitting: Family Medicine

## 2020-05-09 NOTE — Progress Notes (Signed)
CARDIOLOGY OFFICE NOTE  Date:  05/23/2020    [name] Date of Birth: 17-May-1932 Medical Record #[id number]  PCP:  [name], MD  Cardiologist:  Burt Knack  Chief Complaint  Patient presents with  . Follow-up    Seen for Dr. Burt Knack - post cardioversion.     History of Present Illness: [name] is a 87 y.o. female who presents today for a one month check - seen for Dr. Burt Knack.   She has a known history of CAD with remote CABG in 2007, chronic anemia, moderate to severe MR, chronic systolic HF with reduced EF, HTN, HLD and hypothyroidism.   Last seen here a month ago by Richardson Dopp, PA - had had recurrent AF - had had TEE cardioversion in early December with short term results. Amiodarone was started. She is symptomatic with her AF.   Cardioversion was to be re-attempted earlier this month - found to be in sinus bradycardia - HR in the 40's - cardioversion cancelled - Metoprolol cut back to 25 mg. Amiodarone already cut back due to some GI issues.    Comes in today. Here with her husband today. Remains quite bradycardic. She is dizzy. No syncope. No falls. Still with some GI upset - this has improved. Her breathing has improved. No chest pain. She remains still pretty weak. But overall she knows she is back and rhythm and is happy with that.   Past Medical History:  Diagnosis Date  . Allergy   . Anemia, pernicious   . Arthritis    bursitis   . Atrophic gastritis   . B12 deficiency   . CAD (coronary artery disease)   . Carotid artery stenosis   . Cataract   . Diarrhea    on and off for months  . Family history of adverse reaction to anesthesia    extreme nausea brother and sister  . Fibromyalgia   . Gallbladder polyp   . GERD (gastroesophageal reflux disease)   . Heart murmur   . History of hiatal hernia   . History of kidney stones   . HLD (hyperlipidemia)   . HTN (hypertension)   . Hypothyroidism   . Irritable bowel syndrome (IBS)   .  Median arcuate ligament syndrome ([place])   . Osteoporosis   . PVD (peripheral vascular disease) ([place])   . Tubular adenoma of colon 08/2013   with focal high grade dysplasia    Past Surgical History:  Procedure Laterality Date  . ABDOMINAL HYSTERECTOMY    . APPENDECTOMY    . BIOPSY THYROID    . CARDIOVERSION N/A 04/03/2020   Procedure: CARDIOVERSION;  Surgeon: Dorothy Spark, MD;  Location[facility] ENDOSCOPY;  Service: Cardiovascular;  Laterality: N/A;  . cataract surgery    . CHOLECYSTECTOMY N/A 10/24/2014   Procedure: MICROSCOPIC  CHOLECYSTECTOMY;  Surgeon: Ralene Ok, MD;  Location: [facility];  Service: General;  Laterality: N/A;  . COLONOSCOPY    . CORONARY ARTERY BYPASS GRAFT  07  . ESOPHAGEAL MANOMETRY N/A 08/02/2012   Procedure: ESOPHAGEAL MANOMETRY (EM);  Surgeon: Sable Feil, MD;  Location: [facility] ENDOSCOPY;  Service: Endoscopy;  Laterality: N/A;  . ESOPHAGOGASTRODUODENOSCOPY (EGD) WITH PROPOFOL N/A 04/07/2019   Procedure: ESOPHAGOGASTRODUODENOSCOPY (EGD) WITH PROPOFOL;  Surgeon: Milus Banister, MD;  Location: [facility] ENDOSCOPY;  Service: Endoscopy;  Laterality: N/A;  . HEMOSTASIS CLIP PLACEMENT  04/07/2019   Procedure: HEMOSTASIS CLIP PLACEMENT;  Surgeon: Milus Banister, MD;  Location: [facility] ENDOSCOPY;  Service:  Endoscopy;;  . HOT HEMOSTASIS N/A 04/07/2019   Procedure: HOT HEMOSTASIS (ARGON PLASMA COAGULATION/BICAP);  Surgeon: Milus Banister, MD;  Location: [facility];  Service: Endoscopy;  Laterality: N/A;  . POLYPECTOMY  04/07/2019   Procedure: POLYPECTOMY;  Surgeon: Milus Banister, MD;  Location: [facility] ENDOSCOPY;  Service: Endoscopy;;  . [name]  04/07/2019   Procedure: [name];  Surgeon: Milus Banister, MD;  Location: [facility] ENDOSCOPY;  Service: Endoscopy;;  . TEE WITHOUT CARDIOVERSION N/A 04/03/2020   Procedure: TRANSESOPHAGEAL ECHOCARDIOGRAM (TEE);  Surgeon: Dorothy Spark, MD;  Location: [facility]COPY;  Service: Cardiovascular;  Laterality: N/A;  . THYROID  LOBECTOMY    . UPPER GASTROINTESTINAL ENDOSCOPY       Medications: Current Meds  Medication Sig  . acetaminophen (TYLENOL) 500 MG tablet Take 500 mg by mouth every 6 (six) hours as needed for mild pain.  Marland Kitchen amiodarone (PACERONE) 200 MG tablet Take 200 mg by mouth daily.  Marland Kitchen atorvastatin (LIPITOR) 80 MG tablet TAKE 1 TABLET BY MOUTH DAILY AT BEDTIME  . Calcium Carbonate-Vitamin D (CALTRATE 600+D PO) Take 1 tablet by mouth daily with lunch.   . cyanocobalamin (,VITAMIN B-12,) 1000 MCG/ML injection Inject 1,000 mcg into the muscle every 30 (thirty) days.  Marland Kitchen ELIQUIS 2.5 MG TABS tablet TAKE 1 TABLET BY MOUTH TWICE DAILY  . gabapentin (NEURONTIN) 100 MG capsule Take 1 capsule (100 mg total) by mouth 2 (two) times daily.  . hyoscyamine (LEVSIN SL) 0.125 MG SL tablet Place 1 tablet (0.125 mg total) under the tongue every 6 (six) hours as needed.  . lactase (LACTAID) 3000 units tablet Take 3,000 Units by mouth 3 (three) times daily with meals as needed (lactose Intolerance).   Marland Kitchen levothyroxine (SYNTHROID, LEVOTHROID) 50 MCG tablet Take 50 mcg by mouth at bedtime.  . Lidocaine 4 % OINT Apply 1 application topically 3 (three) times daily as needed (pain). Roll-On (otc)  . Lidocaine 4 % PTCH Place 1 patch onto the skin daily as needed (pain.).   Marland Kitchen losartan (COZAAR) 100 MG tablet Take 1 tablet (100 mg total) by mouth daily.  . Multiple Vitamins-Minerals (PRESERVISION AREDS 2 PO) Take 1 tablet by mouth 2 (two) times daily.  Vladimir Faster Glycol-Propyl Glycol (SYSTANE FREE OP) Place 1 drop into both eyes 3 (three) times daily as needed (dry/irritated eyes.).  Marland Kitchen saccharomyces boulardii (FLORASTOR) 250 MG capsule Take 250 mg by mouth 2 (two) times daily.  . sodium chloride (OCEAN) 0.65 % SOLN nasal spray Place 1 spray into both nostrils as needed for congestion.  Marland Kitchen spironolactone (ALDACTONE) 25 MG tablet TAKE 1 TABLET BY MOUTH ONCE DAILY  . sucralfate (CARAFATE) 1 GM/10ML suspension TAKE 10 MLS (1 G TOTAL) BY  MOUTH 2 (TWO) TIMES DAILY.  . [DISCONTINUED] metoprolol succinate (TOPROL-XL) 25 MG 24 hr tablet Take 1 tablet (25 mg total) by mouth daily. Take with or immediately following a meal.     Allergies: Allergies  Allergen Reactions  . Actos [Pioglitazone] Swelling    Ankle swelling  . Sulfonamide Derivatives Swelling  . Adhesive [Tape] Other (See Comments)    WITH PROLONGED EXPOSURE (REDNESS/ITCHING)  . Codeine Nausea And Vomiting    Social History: The patient  reports that she has never smoked. She has never used smokeless tobacco. She reports that she does not drink alcohol and does not use drugs.   Family History: The patient's family history includes Breast cancer in an other family member; Cholelithiasis in her mother; Colon cancer in her brother; Diabetes in  her sister; Heart disease in her mother; Lung cancer in her father and sister; Prostate cancer in her brother; Stomach cancer in her cousin.   Review of Systems: Please see the history of present illness.   All other systems are reviewed and negative.   Physical Exam: VS:  BP 138/78   Pulse (!) 42   Ht 5\' 4"  (1.626 m)   Wt 93 lb 12.8 oz (42.5 kg)   SpO2 96%   BMI 16.10 kg/m  .  BMI Body mass index is 16.1 kg/m.  Wt Readings from Last 3 Encounters:  05/23/20 93 lb 12.8 oz (42.5 kg)  04/17/20 90 lb (40.8 kg)  04/03/20 86 lb (39 kg)    General: Pleasant. Elderly. Alert and in no acute distress. Color is sallow.  Cardiac: Regular rate and rhythm but slow. III/VI systolic murmur noted. No significant edema.   Respiratory:  Lungs are clear to auscultation bilaterally with normal work of breathing.  GI: Soft and nontender.  MS: No deformity or atrophy. Gait and ROM intact. She is using a walker.  Skin: Warm and dry. Color is normal.  Neuro:  Strength and sensation are intact and no gross focal deficits noted.  Psych: Alert, appropriate and with normal affect.   LABORATORY DATA:  EKG:  EKG is ordered today.   Personally reviewed by me. This demonstrates marked bradycardia with HR of 42. She has improved T wave changes noted.   Lab Results  Component Value Date   WBC 7.0 04/30/2020   HGB 10.8 (L) 04/30/2020   HCT 33.1 (L) 04/30/2020   PLT 204 04/30/2020   GLUCOSE 119 (H) 04/30/2020   CHOL 135 11/18/2016   TRIG 54 11/18/2016   HDL 69.20 11/22/2014   LDLCALC 66 11/22/2014   ALT 14 10/27/2019   AST 24 10/27/2019   NA 139 04/30/2020   K 4.4 04/30/2020   CL 104 04/30/2020   CREATININE 1.02 (H) 04/30/2020   BUN 23 04/30/2020   CO2 29 04/30/2020   TSH 4.390 04/05/2018   INR 1.2 07/25/2019   HGBA1C 5.8 11/18/2016     BNP (last 3 results) No results for input(s): BNP in the last 8760 hours.  ProBNP (last 3 results) No results for input(s): PROBNP in the last 8760 hours.   Other Studies Reviewed Today:  05/01/2020 Patient presented for cardioversion today.  Found to be in sinus bradycardia with rate 40s.  Cardioversion cancelled.  Home metoprolol dose decreased from 50 to 25 mg daily given sinus rate in 40s.        Electronically signed by Donato Heinz, MD at 05/01/2020 10:58 AM    TEE CARDIOVERSION IMPRESSIONS 04/03/2020  1. Severe mitral regurgitation, prolapse of the P1,2 and A2 scallopes,  multiple jets, one dominant anteriorly directed. PISA radius 1.0, ERO 0.48  cm2, Reg Vol 84 ml. Moderate pulmonary hypertension, RVSP 47 mmHg.  2. Left ventricular ejection fraction, by estimation, is 50 to 55%. The  left ventricle has low normal function. The left ventricle has no regional  wall motion abnormalities. Left ventricular diastolic function could not  be evaluated.  3. Right ventricular systolic function is normal. The right ventricular  size is normal. There is moderately elevated pulmonary artery systolic  pressure. The estimated right ventricular systolic pressure is 0000000 mmHg.  4. Left atrial size was severely dilated. No left atrial/left atrial  appendage  thrombus was detected. The LAA emptying velocity was 40 cm/s.  5. Right atrial size was severely dilated.  6. The mitral valve is degenerative. No evidence of mitral valve  regurgitation. No evidence of mitral stenosis. There is moderate prolapse  of both leaflets of the mitral valve.  7. Tricuspid valve regurgitation is moderate.  8. The aortic valve is tricuspid. Aortic valve regurgitation is moderate  to severe. No aortic stenosis is present.  9. The inferior vena cava is normal in size with greater than 50%  respiratory variability, suggesting right atrial pressure of 3 mmHg.   Conclusion(s)/Recommendation(s): No LA/LAA thrombus identified. Successful  cardioversion performed with restoration of normal sinus rhythm.     ASSESSMENT & PLAN:     1. PAF - now on amiodarone - converted back to sinus but quite bradycardic - did not require cardioversion - Metoprolol cut back; she remains very bradycardic and probably is symptomatic - will stop Metoprolol and I am cutting Amiodarone. We will get her a nurse visit for one week with EKG. Will get her follow up with Scott back with a repeat EKG as well. Will ask Dr. Burt Knack to review and give input on her valvular heart disease - ?if he wants to see her in structural heart clinic.   2. Chronic anticoagulation - no problems noted.   3. Chronic systolic HF - mildly reduced EF - back in sinus - breathing has improved.   4. MR - severe by recent TEE - also with moderate to severe AI - she was to see Dr. Burt Knack back for discussion of her MR apparently - will ask him to give input.   5. CAD - remote CABG from 2007 - no chest pain.   6. HLD - no recent lipids  7. High risk medicine - needs labs today.   7. Advancing age - overall seems a bit frail to me but apparently was quite independent previously. Will get her back to see Nicki Reaper and Dr. Burt Knack for her ongoing care.    Current medicines are reviewed with the patient today.  The patient  does not have concerns regarding medicines other than what has been noted above.  The following changes have been made:  See above.  Labs/ tests ordered today include:    Orders Placed This Encounter  Procedures  . Lipid panel  . Basic metabolic panel  . CBC  . Hepatic function panel  . TSH  . EKG 12-Lead     Disposition:   FU with nurse visit in one week and then Scott in one month. Will ask  Dr. Burt Knack to weigh in for possible structural heart visit discussion of her valvular heart disease.     Patient is agreeable to this plan and will call if any problems develop in the interim.   SignedTruitt Merle, NP  05/23/2020 2:19 PM  [facility] [address] [place], [place]  [address] Phone: [phone] Fax: [phone]

## 2020-05-15 ENCOUNTER — Telehealth: Payer: Self-pay | Admitting: Family Medicine

## 2020-05-15 NOTE — Telephone Encounter (Signed)
Pt is calling in stating that the pharmacy has a shortage on Rx losartan (COZAAR)  100 MG pt stated that she has been waiting on them to get it back in stock and would like to see if there is something else that she can take in place of it b/c her and her spouse will be out of it.  Pharm:  [facility].

## 2020-05-15 NOTE — Telephone Encounter (Signed)
Has she checked to see if other pharmacies might have it?  If not, we can change to another angiotension receptor blocker- but would need to make sure they are not out of that as well (eg Valsartan 160 mg daily)

## 2020-05-16 NOTE — Telephone Encounter (Signed)
Patient is going to contact other pharmacies to see if she can get her medication.

## 2020-05-17 MED ORDER — LOSARTAN POTASSIUM 100 MG PO TABS: 100.0000 mg | ORAL_TABLET | Freq: Every day | ORAL | 3 refills | Status: DC

## 2020-05-17 NOTE — Addendum Note (Signed)
Addended by: Amado Coe on: 05/17/2020 04:06 PM   Modules accepted: Orders

## 2020-05-17 NOTE — Telephone Encounter (Signed)
Patient called back and wanted to let the provider know that [facility] in [place] has the medication. CB is [phone]

## 2020-05-17 NOTE — Telephone Encounter (Signed)
Losartan refill sent to pharmacy. Patient aware.

## 2020-05-23 ENCOUNTER — Telehealth: Payer: Medicare Other

## 2020-05-23 ENCOUNTER — Ambulatory Visit: Payer: Medicare Other | Admitting: Nurse Practitioner

## 2020-05-23 ENCOUNTER — Encounter: Payer: Self-pay | Admitting: Nurse Practitioner

## 2020-05-23 ENCOUNTER — Other Ambulatory Visit: Payer: Self-pay

## 2020-05-23 VITALS — BP 138/78 | HR 42 | Ht 64.0 in | Wt 93.8 lb

## 2020-05-23 DIAGNOSIS — E782 Mixed hyperlipidemia: Secondary | ICD-10-CM | POA: Diagnosis not present

## 2020-05-23 DIAGNOSIS — Z79899 Other long term (current) drug therapy: Secondary | ICD-10-CM

## 2020-05-23 DIAGNOSIS — I480 Paroxysmal atrial fibrillation: Secondary | ICD-10-CM | POA: Diagnosis not present

## 2020-05-23 DIAGNOSIS — I2510 Atherosclerotic heart disease of native coronary artery without angina pectoris: Secondary | ICD-10-CM | POA: Diagnosis not present

## 2020-05-23 DIAGNOSIS — I4819 Other persistent atrial fibrillation: Secondary | ICD-10-CM | POA: Diagnosis not present

## 2020-05-23 DIAGNOSIS — I5020 Unspecified systolic (congestive) heart failure: Secondary | ICD-10-CM | POA: Diagnosis not present

## 2020-05-23 DIAGNOSIS — I10 Essential (primary) hypertension: Secondary | ICD-10-CM | POA: Diagnosis not present

## 2020-05-23 NOTE — Patient Instructions (Addendum)
  After Visit Summary:  We will be checking the following labs today - BMET, CBC, HPF, lipids and TSH   Medication Instructions:    Continue with your current medicines. BUT  STOP Metoprolol  Decrease Amiodarone to just one pill a day   If you need a refill on your cardiac medications before your next appointment, please call your pharmacy.     Testing/Procedures To Be Arranged:  N/A  Follow-Up:   Nurse visit in one week with EKG. See Richardson Dopp in one month with repeat EKG and OV.    At [facility], you and your health needs are our priority.  As part of our continuing mission to provide you with exceptional heart care, we have created designated Provider Care Teams.  These Care Teams include your primary Cardiologist (physician) and Advanced Practice Providers (APPs -  Physician Assistants and Nurse Practitioners) who all work together to provide you with the care you need, when you need it.  Special Instructions:  . Stay safe, wash your hands for at least 20 seconds and wear a mask when needed.  . It was good to talk with you today.    Call the [place] office at [phone] if you have any questions, problems or concerns.

## 2020-05-24 ENCOUNTER — Other Ambulatory Visit: Payer: Self-pay | Admitting: *Deleted

## 2020-05-24 ENCOUNTER — Ambulatory Visit: Payer: Medicare Other

## 2020-05-24 DIAGNOSIS — R7989 Other specified abnormal findings of blood chemistry: Secondary | ICD-10-CM

## 2020-05-24 LAB — CBC
Hematocrit: 35.8 % (ref 34.0–46.6)
Hemoglobin: 11.4 g/dL (ref 11.1–15.9)
MCH: 27.4 pg (ref 26.6–33.0)
MCHC: 31.8 g/dL (ref 31.5–35.7)
MCV: 86 fL (ref 79–97)
Platelets: 237 10*3/uL (ref 150–450)
RBC: 4.16 x10E6/uL (ref 3.77–5.28)
RDW: 15.3 % (ref 11.7–15.4)
WBC: 7 10*3/uL (ref 3.4–10.8)

## 2020-05-24 LAB — LIPID PANEL
Chol/HDL Ratio: 2 ratio (ref 0.0–4.4)
Cholesterol, Total: 131 mg/dL (ref 100–199)
HDL: 64 mg/dL (ref >39)
LDL Chol Calc (NIH): 47 mg/dL (ref 0–99)
Triglycerides: 115 mg/dL (ref 0–149)
VLDL Cholesterol Cal: 20 mg/dL (ref 5–40)

## 2020-05-24 LAB — HEPATIC FUNCTION PANEL
ALT: 75 IU/L — ABNORMAL HIGH (ref 0–32)
AST: 72 IU/L — ABNORMAL HIGH (ref 0–40)
Albumin: 3.7 g/dL (ref 3.6–4.6)
Alkaline Phosphatase: 80 IU/L (ref 44–121)
Bilirubin Total: 0.3 mg/dL (ref 0.0–1.2)
Bilirubin, Direct: 0.12 mg/dL (ref 0.00–0.40)
Total Protein: 6.7 g/dL (ref 6.0–8.5)

## 2020-05-24 LAB — BASIC METABOLIC PANEL
BUN/Creatinine Ratio: 25 (ref 12–28)
BUN: 27 mg/dL (ref 8–27)
CO2: 20 mmol/L (ref 20–29)
Calcium: 8.7 mg/dL (ref 8.7–10.3)
Chloride: 105 mmol/L (ref 96–106)
Creatinine, Ser: 1.1 mg/dL — ABNORMAL HIGH (ref 0.57–1.00)
GFR calc Af Amer: 53 mL/min/{1.73_m2} — ABNORMAL LOW (ref >59)
GFR calc non Af Amer: 46 mL/min/{1.73_m2} — ABNORMAL LOW (ref >59)
Glucose: 134 mg/dL — ABNORMAL HIGH (ref 65–99)
Potassium: 4.9 mmol/L (ref 3.5–5.2)
Sodium: 139 mmol/L (ref 134–144)

## 2020-05-24 LAB — TSH: TSH: 12.7 u[IU]/mL — ABNORMAL HIGH (ref 0.450–4.500)

## 2020-05-28 ENCOUNTER — Other Ambulatory Visit: Payer: Self-pay

## 2020-05-28 ENCOUNTER — Other Ambulatory Visit: Payer: Medicare Other | Admitting: *Deleted

## 2020-05-28 ENCOUNTER — Ambulatory Visit (INDEPENDENT_AMBULATORY_CARE_PROVIDER_SITE_OTHER): Payer: Medicare Other

## 2020-05-28 VITALS — BP 156/68 | HR 55 | Wt 91.0 lb

## 2020-05-28 DIAGNOSIS — Z79899 Other long term (current) drug therapy: Secondary | ICD-10-CM | POA: Diagnosis not present

## 2020-05-28 DIAGNOSIS — R7989 Other specified abnormal findings of blood chemistry: Secondary | ICD-10-CM | POA: Diagnosis not present

## 2020-05-28 DIAGNOSIS — I4819 Other persistent atrial fibrillation: Secondary | ICD-10-CM

## 2020-05-28 NOTE — Progress Notes (Signed)
Pt came in for nurse visit/ EKG today... she was taken off metoprolol and her Amiodarone decreased to qd after her OV with Truitt Merle NP on 05/23/20.   Pt says her palpitations have improved. She is still tired on and off but says she feels fairly well.   EKG NSR LBBB R55.   Per Dr. Irish Lack the DOD pt to continue with her previous plan and to keep her follow up with Dr. Burt Knack 06/20/20.

## 2020-05-28 NOTE — Patient Instructions (Signed)
  Medication Instructions:  Your physician recommends that you continue on your current medications as directed. Please refer to the Current Medication list given to you today.  *If you need a refill on your cardiac medications before your next appointment, please call your pharmacy*   Lab Work: none If you have labs (blood work) drawn today and your tests are completely normal, you will receive your results only by: Marland Kitchen MyChart Message (if you have MyChart) OR . A paper copy in the mail If you have any lab test that is abnormal or we need to change your treatment, we will call you to review the results.   Testing/Procedures: EKG today    Follow-Up: At [facility], you and your health needs are our priority.  As part of our continuing mission to provide you with exceptional heart care, we have created designated Provider Care Teams.  These Care Teams include your primary Cardiologist (physician) and Advanced Practice Providers (APPs -  Physician Assistants and Nurse Practitioners) who all work together to provide you with the care you need, when you need it.  We recommend signing up for the patient portal called "MyChart".  Sign up information is provided on this After Visit Summary.  MyChart is used to connect with patients for Virtual Visits (Telemedicine).  Patients are able to view lab/test results, encounter notes, upcoming appointments, etc.  Non-urgent messages can be sent to your provider as well.   To learn more about what you can do with MyChart, go to [email or web address].

## 2020-05-29 ENCOUNTER — Other Ambulatory Visit: Payer: Self-pay | Admitting: *Deleted

## 2020-05-29 LAB — HEPATIC FUNCTION PANEL
ALT: 99 IU/L — ABNORMAL HIGH (ref 0–32)
AST: 84 IU/L — ABNORMAL HIGH (ref 0–40)
Albumin: 3.9 g/dL (ref 3.6–4.6)
Alkaline Phosphatase: 83 IU/L (ref 44–121)
Bilirubin Total: 0.3 mg/dL (ref 0.0–1.2)
Bilirubin, Direct: 0.13 mg/dL (ref 0.00–0.40)
Total Protein: 7.2 g/dL (ref 6.0–8.5)

## 2020-05-29 MED ORDER — ATORVASTATIN CALCIUM 80 MG PO TABS: 40.0000 mg | ORAL_TABLET | Freq: Every day | ORAL | 0 refills | Status: AC

## 2020-05-30 DIAGNOSIS — H353132 Nonexudative age-related macular degeneration, bilateral, intermediate dry stage: Secondary | ICD-10-CM | POA: Diagnosis not present

## 2020-05-30 DIAGNOSIS — H5213 Myopia, bilateral: Secondary | ICD-10-CM | POA: Diagnosis not present

## 2020-05-30 DIAGNOSIS — Z961 Presence of intraocular lens: Secondary | ICD-10-CM | POA: Diagnosis not present

## 2020-05-30 DIAGNOSIS — H04123 Dry eye syndrome of bilateral lacrimal glands: Secondary | ICD-10-CM | POA: Diagnosis not present

## 2020-05-31 ENCOUNTER — Ambulatory Visit (INDEPENDENT_AMBULATORY_CARE_PROVIDER_SITE_OTHER): Payer: Medicare Other

## 2020-05-31 ENCOUNTER — Other Ambulatory Visit: Payer: Self-pay

## 2020-05-31 DIAGNOSIS — E538 Deficiency of other specified B group vitamins: Secondary | ICD-10-CM | POA: Diagnosis not present

## 2020-05-31 DIAGNOSIS — Z23 Encounter for immunization: Secondary | ICD-10-CM | POA: Diagnosis not present

## 2020-05-31 MED ORDER — CYANOCOBALAMIN 1000 MCG/ML IJ SOLN
1000.0000 ug | Freq: Once | INTRAMUSCULAR | Status: AC
  Administered 2020-05-31: 1000 ug via INTRAMUSCULAR

## 2020-05-31 NOTE — Progress Notes (Signed)
B12 injection and flu vaccine given.

## 2020-06-04 ENCOUNTER — Ambulatory Visit (INDEPENDENT_AMBULATORY_CARE_PROVIDER_SITE_OTHER): Payer: Medicare Other | Admitting: Pharmacist

## 2020-06-04 DIAGNOSIS — E785 Hyperlipidemia, unspecified: Secondary | ICD-10-CM

## 2020-06-04 DIAGNOSIS — I10 Essential (primary) hypertension: Secondary | ICD-10-CM

## 2020-06-04 NOTE — Progress Notes (Signed)
Chronic Care Management Pharmacy  Name: EMMALENA CANNY       MRN: [id number]        DOB: [birth year]   Chief Complaint/ HPI  Salome Spotted,  87 y.o. , female presents for their follow up CCM visit with the clinical pharmacist via telephone due to COVID-19 Pandemic.  Their chronic conditions include: Hypertension, Hyperlipidemia, Atrial Fibrillation, Coronary Artery Disease, Hypothyroidism and Osteoporosis   Office Visits: 01/24/20 Carolann Littler, MD: Patient presented with hip pain follow up. Prescribed gabapentin 100 mg BID.  01/09/20 Carolann Littler, MD: Patient presented with chronic back pain follow up. Prescribed prednisone taper and placed referral to neurosurgery.   12/05/19 Carolann Littler, MD: Patient presented with right flank pain. Prescribed Keflex 500 mg TID.  08/23/19: Patient presented to Dr. Elease Hashimoto for follow-up. Cephalexin stopped (completed course). No medication changes made.  08/01/19: Patient presented to Dr. Elease Hashimoto for hospital follow-up.  07/20/19: Patient presented to Dr. Elease Hashimoto for headache.  06/22/19: Patient presented to Dr. Elease Hashimoto for AWV. Azelastine stopped (change in therapy)  Consult Visit: 05/23/20  Truitt Merle, NP (cardiology): Patient presented for Afib follow up. Decreased to amiodarone 200 mg daily.  04/17/20 Richardson Dopp, PA-C (cardiology): Patient presented for Afib follow up. Prescribed amiodarone 400 mg BID.  04/03/20 Patient admitted for cardioversion.  02/20/20 Sherren Mocha, MD (cardiology): Patient presented for Afib follow up.    02/20/20 Sherren Mocha, MD (cardiology): Patient presented for CAD follow up. Switched carvedilol to metoprolol succinate 50 mg daily.  10/27/19: Patient referred to Carl Best, NP (GI) for Diarrhea. Waiting for results prior to starting budesonide. 09/22/19: Patient presented to ED for diarrhea/weakness.  08/22/19: Patient presented to Dr. Burt Knack (Cardiology) for follow-up. Bp 148/82,  Carvedilol increased to 6.25 mg BID. Aspirin stopped (completed course).  07/25/19: Patient presented to ED for vaginal bleeding. Patient started cephalexin 500 mg TID for 7 days.   Medications:  Current Outpatient Medications on File Prior to Visit  Medication Sig Dispense Refill  . acetaminophen (TYLENOL) 500 MG tablet Take 500 mg by mouth every 6 (six) hours as needed for mild pain.    Marland Kitchen amiodarone (PACERONE) 200 MG tablet Take 200 mg by mouth daily.    Marland Kitchen atorvastatin (LIPITOR) 80 MG tablet Take 0.5 tablets (40 mg total) by mouth at bedtime. 45 tablet 0  . Calcium Carbonate-Vitamin D (CALTRATE 600+D PO) Take 1 tablet by mouth daily with lunch.     . cyanocobalamin (,VITAMIN B-12,) 1000 MCG/ML injection Inject 1,000 mcg into the muscle every 30 (thirty) days.    Marland Kitchen ELIQUIS 2.5 MG TABS tablet TAKE 1 TABLET BY MOUTH TWICE DAILY 180 tablet 3  . gabapentin (NEURONTIN) 100 MG capsule Take 1 capsule (100 mg total) by mouth 2 (two) times daily. 60 capsule 1  . hyoscyamine (LEVSIN SL) 0.125 MG SL tablet Place 1 tablet (0.125 mg total) under the tongue every 6 (six) hours as needed. 30 tablet 2  . lactase (LACTAID) 3000 units tablet Take 3,000 Units by mouth 3 (three) times daily with meals as needed (lactose Intolerance).     Marland Kitchen levothyroxine (SYNTHROID, LEVOTHROID) 50 MCG tablet Take 50 mcg by mouth at bedtime.    . Lidocaine 4 % OINT Apply 1 application topically 3 (three) times daily as needed (pain). Roll-On (otc)    . Lidocaine 4 % PTCH Place 1 patch onto the skin daily as needed (pain.).     Marland Kitchen losartan (COZAAR) 100 MG tablet Take 1 tablet (100 mg total)  by mouth daily. 90 tablet 3  . Multiple Vitamins-Minerals (PRESERVISION AREDS 2 PO) Take 1 tablet by mouth 2 (two) times daily.    Vladimir Faster Glycol-Propyl Glycol (SYSTANE FREE OP) Place 1 drop into both eyes 3 (three) times daily as needed (dry/irritated eyes.).    Marland Kitchen saccharomyces boulardii (FLORASTOR) 250 MG capsule Take 250 mg by mouth 2 (two)  times daily.    . sodium chloride (OCEAN) 0.65 % SOLN nasal spray Place 1 spray into both nostrils as needed for congestion.    Marland Kitchen spironolactone (ALDACTONE) 25 MG tablet TAKE 1 TABLET BY MOUTH ONCE DAILY 90 tablet 2  . sucralfate (CARAFATE) 1 GM/10ML suspension TAKE 10 MLS (1 G TOTAL) BY MOUTH 2 (TWO) TIMES DAILY. 420 mL 1   No current facility-administered medications on file prior to visit.   Patient doesn't have much of an appetite and nothing tastes really good to her right now.   Current Diagnosis/Assessment:   Goals Addressed            This Visit's Progress   . Chronic Care Management       CARE PLAN ENTRY (see longitudinal plan of care for additional care plan information)  Current Barriers:  . Chronic Disease Management support, education, and care coordination needs related to Hypertension, Hyperlipidemia, Atrial Fibrillation, Coronary Artery Disease, Hypothyroidism and Osteoporosis    Hypertension BP Readings from Last 3 Encounters:  06/08/20 128/60  05/28/20 (!) 156/68  05/23/20 138/78   . Pharmacist Clinical Goal(s): o Over the next 120 days, patient will work with PharmD and providers to maintain BP goal <140/90 . Current regimen:   Losartan 100 mg daily   Spirinolactone 25 mg daily   . Interventions: o Discussed low salt diet and exercising as tolerated extensively . Patient self care activities - Over the next 120 days, patient will: o Check blood pressure weekly, document, and provide at future appointments o Ensure daily salt intake < 2300 mg/day o Patient will look at [facility] catalog to see if she is able to get a new BP cuff  Hyperlipidemia Lab Results  Component Value Date/Time   LDLCALC 47 05/23/2020 02:31 PM   . Pharmacist Clinical Goal(s): o Over the next 90 days, patient will work with PharmD and providers to maintain LDL goal < 70 . Current regimen:  o Atorvastatin 40 mg daily  . Interventions: o Discussed low cholesterol diet and  exercising as tolerated extensively  Medication management . Pharmacist Clinical Goal(s): o Over the next 120 days, patient will work with PharmD and providers to maintain optimal medication adherence . Current pharmacy: [facility] . Interventions o Comprehensive medication review performed. o Continue current medication management strategy . Patient self care activities - Over the next 120 days, patient will: o Take medications as prescribed o Report any questions or concerns to PharmD and/or provider(s)      AFIB   Patient is currently rhythm controlled. Office heart rates are  Pulse Readings from Last 3 Encounters:  06/08/20 (!) 54  05/28/20 (!) 55  05/23/20 (!) 42    CHA2DS2-VASc Score = 6  The patient's score is based upon: CHF History: Yes HTN History: Yes Diabetes History: No Stroke History: No Vascular Disease History: Yes Age Score: 2 Gender Score: 1  Patient has failed these meds in past: none Patient is currently controlled on the following medications:   Amiodarone 200 mg 1 tablet daily  Eliquis 2.5 mg BID (states pharmacy does not run through insurance costs $  45)   We discussed:  monitoring HR along with BP and  Monitoring for signs of bleeding such as unexplained and excessive bleeding from a cut or injury, easy or excessive bruising, blood in urine or stools, and nosebleeds without a known cause  Plan  Continue current medications  Hypertension   BP goal is:  <140/90  Office blood pressures are  BP Readings from Last 3 Encounters:  06/08/20 128/60  05/28/20 (!) 156/68  05/23/20 138/78   Patient checks BP at home infrequently Patient home BP readings are ranging: n/a  Patient has failed these meds in the past: none Patient is currently controlled on the following medications:   Losartan 100 mg daily (EM)  Spirinolactone 25 mg daily   We discussed importance of monitoring blood pressure at home   Plan Patient will look into the  [facility] catalog to see if she can order a BP cuff. Continue current medications     Hyperlipidemia   LDL goal < 70  Last lipids Lab Results  Component Value Date   CHOL 131 05/23/2020   HDL 64 05/23/2020   LDLCALC 47 05/23/2020   TRIG 115 05/23/2020   CHOLHDL 2.0 05/23/2020   Hepatic Function Latest Ref Rng & Units 05/28/2020 05/23/2020 10/27/2019  Total Protein 6.0 - 8.5 g/dL 7.2 6.7 7.4  Albumin 3.6 - 4.6 g/dL 3.9 3.7 4.2  AST 0 - 40 IU/L 84(H) 72(H) 24  ALT 0 - 32 IU/L 99(H) 75(H) 14  Alk Phosphatase 44 - 121 IU/L 83 80 60  Total Bilirubin 0.0 - 1.2 mg/dL 0.3 0.3 0.6  Bilirubin, Direct 0.00 - 0.40 mg/dL 0.13 0.12 -     The ASCVD Risk score ([place]., et al., 2013) failed to calculate for the following reasons:   The 2013 ASCVD risk score is only valid for ages 39 to 9   Patient has failed these meds in past: none Patient is currently controlled on the following medications:   Atorvastatin 40 mg daily   We discussed:  diet and exercise extensively  Plan  Continue current medications   Hypothyroidism  Had a large growth in her thyroid and sees Dr. Buddy Duty.  Lab Results  Component Value Date/Time   TSH 12.700 (H) 05/23/2020 02:31 PM   TSH 4.390 04/05/2018 12:02 PM    Patient has failed these meds in past: none Patient is currently uncontrolled on the following medications:   Levothyroxine 50 mcg daily (HS)   We discussed:  Takes at bedtime to separate from other medications.  -Plan to discuss with Dr. Buddy Duty about elevated TSH from recent [place] by Dr. Buddy Duty. Continue current medications  Osteoporosis   Managed by Dr. Buddy Duty    Last DEXA Scan: Jan 2018 (Not in chart)               T-Score femoral neck: n/a             T-Score total hip: n/a             T-Score lumbar spine: n/a             T-Score forearm radius: n/a             10-year probability of major osteoporotic fracture: n/a             10-year probability of hip fracture: n/a  No  results found for: VD25OH   Patient is a candidate for pharmacologic treatment due to osteoporosis diagnosis  Patient has taken these meds in past: Zoledronic Acid 5 mg/100 mL (2013-2019)  Patient is currently controlled on the following medications:   Calcium Carbonate-Vit D 600 mg + D daily (sometimes forgets to take)   We discussed:  Recommend 670 140 1791 units of vitamin D daily. Recommend 1200 mg of calcium daily from dietary and supplemental sources.  Plan Patient has an appt tomorrow to discuss. Continue current medications  Chronic pain   Cervical Spondylosis   Patient has failed these meds in past: n/a Patient is currently controlled on the following medications:   APAP 500 mg q6hr PRN (Hasn't used in 2 weeks)  Lidocaine patch daily PRN (difficult to use)  Lidocaine Roll-On TID PRN (uses daily)   Tramadol 50 mg PRN (hasn't need in one month)   We discussed:  Awaiting surgery for lumbar procedure, on hold with heart  Tylenol 3,000 mg/day  -Patient has been doing some exercises at home, uses heat massage and lidocaine  Plan  Continue current medications  Allergic rhinitis   "Sinus Disease" Has Allergist doctor.   Patient has failed these meds in past: n/a Patient is currently controlled on the following medications:   Flonase 50 mcg/act 1 spray daily  Ocean 0.65% nasal spray 1 spray TID PRN   Claritin 10 mg daily PRN -severe allergies.   We discussed:  Still struggling with post nasal drip   Plan  Continue current medications  Misc / OTC    Vitamin B12 IM 1027mg every 30 days (comes into the clinic 11/21/19)   Ferrous Sulfate 28 mg daily   Hyoscyamine 0.125 mg SL q6hr PRN -   Atrovent nasal spray   Preservision Areds 2 PO   Systane Free OP 1 drop both eyes TID PRN   Sucralfate 1g/135mBID PRN   Last labs       Vitamin B-12  Date Value Ref Range Status  05/23/2019 >1500 (H) 211 - 911 pg/mL Final        Hemoglobin  Date Value  Ref Range Status  10/27/2019 13.2 12.0 - 15.0 g/dL Final     We discussed:  Counseled patient to separate sucralfate from other medications by at least 2 hours.  Plan  Recommend stopping ferrous sulfate due to sustained stable hemoglobin levels.   Vaccines   Reviewed and discussed patient's vaccination history.    Immunization History  Administered Date(s) Administered  . Fluad Quad(high Dose 65+) 01/24/2019, 05/31/2020  . Influenza Split 01/27/2011  . Influenza Whole 02/18/2008, 02/26/2010  . Influenza, High Dose Seasonal PF 03/15/2015, 01/07/2017, 02/10/2017, 03/03/2018, 06/10/2019, 06/07/2020  . Influenza,inj,Quad PF,6+ Mos 02/01/2014  . Influenza-Unspecified 01/27/2012, 01/27/2015, 02/22/2016  . PFIZER(Purple Top)SARS-COV-2 Vaccination 05/09/2019, 05/30/2019, 04/19/2020  . Pneumococcal Conjugate-13 08/25/2013  . Pneumococcal Polysaccharide-23 06/27/2004, 08/26/2005, 03/15/2015, 01/07/2017, 07/29/2017, 06/10/2019, 06/07/2020  . Zoster 01/27/2015    Confirmed COVID booster with [facility] and updated immunization record.  Plan  Recommended patient receive shingles vaccine at pharmacy.   Medication Management   [place]  [id number]: [phone]: [phone]  [id number] [address] [place] [id number]: [phone]: [phone] pill box? Yes - AM and PM box that she fills herself weekly.  Pt endorses 100% compliance  We discussed: Current pharmacy is preferred with insurance plan and patient is satisfied with pharmacy services   Plan  Continue  current medication management strategy   Follow up: 4 month phone visit  Jeni Salles, PharmD [place] Pharmacist [place] [facility] [place] [phone]

## 2020-06-05 DIAGNOSIS — M810 Age-related osteoporosis without current pathological fracture: Secondary | ICD-10-CM | POA: Diagnosis not present

## 2020-06-05 DIAGNOSIS — R7303 Prediabetes: Secondary | ICD-10-CM | POA: Diagnosis not present

## 2020-06-05 DIAGNOSIS — E039 Hypothyroidism, unspecified: Secondary | ICD-10-CM | POA: Diagnosis not present

## 2020-06-05 DIAGNOSIS — Z8639 Personal history of other endocrine, nutritional and metabolic disease: Secondary | ICD-10-CM | POA: Diagnosis not present

## 2020-06-05 DIAGNOSIS — R636 Underweight: Secondary | ICD-10-CM | POA: Diagnosis not present

## 2020-06-07 DIAGNOSIS — R062 Wheezing: Secondary | ICD-10-CM | POA: Diagnosis not present

## 2020-06-07 DIAGNOSIS — K219 Gastro-esophageal reflux disease without esophagitis: Secondary | ICD-10-CM | POA: Diagnosis not present

## 2020-06-07 DIAGNOSIS — J301 Allergic rhinitis due to pollen: Secondary | ICD-10-CM | POA: Diagnosis not present

## 2020-06-07 DIAGNOSIS — J300 Vasomotor rhinitis: Secondary | ICD-10-CM | POA: Diagnosis not present

## 2020-06-08 ENCOUNTER — Encounter: Payer: Self-pay | Admitting: Family Medicine

## 2020-06-08 ENCOUNTER — Other Ambulatory Visit: Payer: Self-pay

## 2020-06-08 ENCOUNTER — Ambulatory Visit (INDEPENDENT_AMBULATORY_CARE_PROVIDER_SITE_OTHER): Payer: Medicare Other | Admitting: Family Medicine

## 2020-06-08 VITALS — BP 128/60 | HR 54 | Ht 64.0 in | Wt 88.0 lb

## 2020-06-08 DIAGNOSIS — M545 Low back pain, unspecified: Secondary | ICD-10-CM | POA: Diagnosis not present

## 2020-06-08 DIAGNOSIS — E538 Deficiency of other specified B group vitamins: Secondary | ICD-10-CM

## 2020-06-08 DIAGNOSIS — G8929 Other chronic pain: Secondary | ICD-10-CM | POA: Diagnosis not present

## 2020-06-08 DIAGNOSIS — K219 Gastro-esophageal reflux disease without esophagitis: Secondary | ICD-10-CM | POA: Insufficient documentation

## 2020-06-08 DIAGNOSIS — J301 Allergic rhinitis due to pollen: Secondary | ICD-10-CM | POA: Insufficient documentation

## 2020-06-08 DIAGNOSIS — I4819 Other persistent atrial fibrillation: Secondary | ICD-10-CM

## 2020-06-08 DIAGNOSIS — R062 Wheezing: Secondary | ICD-10-CM | POA: Insufficient documentation

## 2020-06-08 DIAGNOSIS — J300 Vasomotor rhinitis: Secondary | ICD-10-CM | POA: Insufficient documentation

## 2020-06-08 DIAGNOSIS — E039 Hypothyroidism, unspecified: Secondary | ICD-10-CM

## 2020-06-08 DIAGNOSIS — R195 Other fecal abnormalities: Secondary | ICD-10-CM | POA: Insufficient documentation

## 2020-06-08 MED ORDER — TRAMADOL HCL 50 MG PO TABS: 50.0000 mg | ORAL_TABLET | Freq: Three times a day (TID) | ORAL | 1 refills | Status: AC | PRN

## 2020-06-08 NOTE — Progress Notes (Signed)
Established Patient Office Visit  Subjective:  Patient ID: [name], female    DOB: [birth year]  Age: 87 y.o. MRN: [id number]  CC:  Chief Complaint  Patient presents with  . Follow-up    HPI [name] presents for medical follow-up.  She has had multiple medical issues this year.  She has A. fib and is on amiodarone as well as Eliquis.  She had some recent elevated liver enzymes and had reduction in amiodarone dose and subsequently Lipitor.  She has follow-up with cardiology next week and plans to follow-up liver transaminase levels then.  She had recent TSH of 12.7.  She is on low-dose levothyroxine 50 mcg daily.  She has follow-up scheduled with her endocrinologist.  One of her main issues has been chronic low back pain.  She was seen by neurosurgeon in the past year not felt to be a surgical candidate.  She does take tramadol occasionally and requesting refills.  She takes these infrequently usually when she is going to be moving around a lot more.  History of B12 deficiency.  She has been inconsistent with injections.  She has not had level in over a year.  She would like to get this rechecked and hopes to get this done when she gets labs next week.  Past Medical History:  Diagnosis Date  . Allergy   . Anemia, pernicious   . Arthritis    bursitis   . Atrophic gastritis   . B12 deficiency   . CAD (coronary artery disease)   . Carotid artery stenosis   . Cataract   . Diarrhea    on and off for months  . Family history of adverse reaction to anesthesia    extreme nausea brother and sister  . Fibromyalgia   . Gallbladder polyp   . GERD (gastroesophageal reflux disease)   . Heart murmur   . History of hiatal hernia   . History of kidney stones   . HLD (hyperlipidemia)   . HTN (hypertension)   . Hypothyroidism   . Irritable bowel syndrome (IBS)   . Median arcuate ligament syndrome ([place])   . Osteoporosis   . PVD (peripheral vascular disease) ([place])   . Tubular  adenoma of colon 08/2013   with focal high grade dysplasia    Past Surgical History:  Procedure Laterality Date  . ABDOMINAL HYSTERECTOMY    . APPENDECTOMY    . BIOPSY THYROID    . CARDIOVERSION N/A 04/03/2020   Procedure: CARDIOVERSION;  Surgeon: Dorothy Spark, MD;  Location: [facility] ENDOSCOPY;  Service: Cardiovascular;  Laterality: N/A;  . cataract surgery    . CHOLECYSTECTOMY N/A 10/24/2014   Procedure: MICROSCOPIC  CHOLECYSTECTOMY;  Surgeon: Ralene Ok, MD;  Location: [place];  Service: General;  Laterality: N/A;  . COLONOSCOPY    . CORONARY ARTERY BYPASS GRAFT  07  . ESOPHAGEAL MANOMETRY N/A 08/02/2012   Procedure: ESOPHAGEAL MANOMETRY (EM);  Surgeon: Sable Feil, MD;  Location: [facility] ENDOSCOPY;  Service: Endoscopy;  Laterality: N/A;  . ESOPHAGOGASTRODUODENOSCOPY (EGD) WITH PROPOFOL N/A 04/07/2019   Procedure: ESOPHAGOGASTRODUODENOSCOPY (EGD) WITH PROPOFOL;  Surgeon: Milus Banister, MD;  Location: [facility] ENDOSCOPY;  Service: Endoscopy;  Laterality: N/A;  . HEMOSTASIS CLIP PLACEMENT  04/07/2019   Procedure: HEMOSTASIS CLIP PLACEMENT;  Surgeon: Milus Banister, MD;  Location: [facility] ENDOSCOPY;  Service: Endoscopy;;  . HOT HEMOSTASIS N/A 04/07/2019   Procedure: HOT HEMOSTASIS (ARGON PLASMA COAGULATION/BICAP);  Surgeon: Milus Banister, MD;  Location: [facility] ENDOSCOPY;  Service: Endoscopy;  Laterality: N/A;  . POLYPECTOMY  04/07/2019   Procedure: POLYPECTOMY;  Surgeon: Milus Banister, MD;  Location: [facility] ENDOSCOPY;  Service: Endoscopy;;  . [name]  04/07/2019   Procedure: [name];  Surgeon: Milus Banister, MD;  Location: [facility] ENDOSCOPY;  Service: Endoscopy;;  . TEE WITHOUT CARDIOVERSION N/A 04/03/2020   Procedure: TRANSESOPHAGEAL ECHOCARDIOGRAM (TEE);  Surgeon: Dorothy Spark, MD;  Location: [facility] ENDOSCOPY;  Service: Cardiovascular;  Laterality: N/A;  . THYROID LOBECTOMY    . UPPER GASTROINTESTINAL ENDOSCOPY      Family History  Problem Relation Age of Onset  . Heart  disease Mother   . Cholelithiasis Mother   . Prostate cancer Brother   . Colon cancer Brother   . Breast cancer Other        niece - breast cancer  . Diabetes Sister   . Lung cancer Father   . Lung cancer Sister   . Stomach cancer Cousin   . Esophageal cancer Neg Hx   . Rectal cancer Neg Hx     Social History   Socioeconomic History  . Marital status: Married    Spouse name: [name]  . Number of children: 0  . Years of education: 4  . Highest education level: Not on file  Occupational History  . Occupation: retired    Fish farm manager: RETIRED  Tobacco Use  . Smoking status: Never Smoker  . Smokeless tobacco: Never Used  Vaping Use  . Vaping Use: Never used  Substance and Sexual Activity  . Alcohol use: No    Alcohol/week: 0.0 standard drinks  . Drug use: No  . Sexual activity: Not on file  Other Topics Concern  . Not on file  Social History Narrative   [name], Married. Lives with husband. Had been a very active woman.   Social Determinants of Health   Financial Resource Strain: Low Risk   . Difficulty of Paying Living Expenses: Not hard at all  Food Insecurity: Not on file  Transportation Needs: No Transportation Needs  . Lack of Transportation (Medical): No  . Lack of Transportation (Non-Medical): No  Physical Activity: Not on file  Stress: Not on file  Social Connections: Not on file  Intimate Partner Violence: Not on file    Outpatient Medications Prior to Visit  Medication Sig Dispense Refill  . acetaminophen (TYLENOL) 500 MG tablet Take 500 mg by mouth every 6 (six) hours as needed for mild pain.    Marland Kitchen amiodarone (PACERONE) 200 MG tablet Take 200 mg by mouth daily.    Marland Kitchen atorvastatin (LIPITOR) 80 MG tablet Take 0.5 tablets (40 mg total) by mouth at bedtime. 45 tablet 0  . Calcium Carbonate-Vitamin D (CALTRATE 600+D PO) Take 1 tablet by mouth daily with lunch.     . cyanocobalamin (,VITAMIN B-12,) 1000 MCG/ML injection Inject 1,000 mcg into the muscle every 30  (thirty) days.    Marland Kitchen ELIQUIS 2.5 MG TABS tablet TAKE 1 TABLET BY MOUTH TWICE DAILY 180 tablet 3  . gabapentin (NEURONTIN) 100 MG capsule Take 1 capsule (100 mg total) by mouth 2 (two) times daily. 60 capsule 1  . hyoscyamine (LEVSIN SL) 0.125 MG SL tablet Place 1 tablet (0.125 mg total) under the tongue every 6 (six) hours as needed. 30 tablet 2  . lactase (LACTAID) 3000 units tablet Take 3,000 Units by mouth 3 (three) times daily with meals as needed (lactose Intolerance).     Marland Kitchen levothyroxine (SYNTHROID, LEVOTHROID) 50 MCG tablet Take 50 mcg by mouth  at bedtime.    . Lidocaine 4 % OINT Apply 1 application topically 3 (three) times daily as needed (pain). Roll-On (otc)    . Lidocaine 4 % PTCH Place 1 patch onto the skin daily as needed (pain.).     Marland Kitchen losartan (COZAAR) 100 MG tablet Take 1 tablet (100 mg total) by mouth daily. 90 tablet 3  . Multiple Vitamins-Minerals (PRESERVISION AREDS 2 PO) Take 1 tablet by mouth 2 (two) times daily.    Vladimir Faster Glycol-Propyl Glycol (SYSTANE FREE OP) Place 1 drop into both eyes 3 (three) times daily as needed (dry/irritated eyes.).    Marland Kitchen saccharomyces boulardii (FLORASTOR) 250 MG capsule Take 250 mg by mouth 2 (two) times daily.    . sodium chloride (OCEAN) 0.65 % SOLN nasal spray Place 1 spray into both nostrils as needed for congestion.    Marland Kitchen spironolactone (ALDACTONE) 25 MG tablet TAKE 1 TABLET BY MOUTH ONCE DAILY 90 tablet 2  . sucralfate (CARAFATE) 1 GM/10ML suspension TAKE 10 MLS (1 G TOTAL) BY MOUTH 2 (TWO) TIMES DAILY. 420 mL 1   No facility-administered medications prior to visit.    Allergies  Allergen Reactions  . Actos [Pioglitazone] Swelling    Ankle swelling  . Sulfonamide Derivatives Swelling  . Adhesive [Tape] Other (See Comments)    WITH PROLONGED EXPOSURE (REDNESS/ITCHING)  . Codeine Nausea And Vomiting    ROS Review of Systems  Constitutional: Negative for appetite change and unexpected weight change.  Respiratory: Negative for  cough and shortness of breath.   Cardiovascular: Negative for chest pain.  Gastrointestinal: Negative for abdominal pain, nausea and vomiting.  Genitourinary: Negative for dysuria.  Musculoskeletal: Positive for back pain.      Objective:    Physical Exam Vitals reviewed.  Cardiovascular:     Rate and Rhythm: Normal rate.  Pulmonary:     Effort: Pulmonary effort is normal.     Breath sounds: Normal breath sounds.  Musculoskeletal:     Right lower leg: No edema.     Left lower leg: No edema.  Neurological:     Mental Status: She is alert.     BP 128/60   Pulse (!) 54   Ht 5\' 4"  (1.626 m)   Wt 88 lb (39.9 kg)   SpO2 97%   BMI 15.11 kg/m  Wt Readings from Last 3 Encounters:  06/08/20 88 lb (39.9 kg)  05/28/20 91 lb (41.3 kg)  05/23/20 93 lb 12.8 oz (42.5 kg)     There are no preventive care reminders to display for this patient.  There are no preventive care reminders to display for this patient.  Lab Results  Component Value Date   TSH 12.700 (H) 05/23/2020   Lab Results  Component Value Date   WBC 7.0 05/23/2020   HGB 11.4 05/23/2020   HCT 35.8 05/23/2020   MCV 86 05/23/2020   PLT 237 05/23/2020   Lab Results  Component Value Date   NA 139 05/23/2020   K 4.9 05/23/2020   CO2 20 05/23/2020   GLUCOSE 134 (H) 05/23/2020   BUN 27 05/23/2020   CREATININE 1.10 (H) 05/23/2020   BILITOT 0.3 05/28/2020   ALKPHOS 83 05/28/2020   AST 84 (H) 05/28/2020   ALT 99 (H) 05/28/2020   PROT 7.2 05/28/2020   ALBUMIN 3.9 05/28/2020   CALCIUM 8.7 05/23/2020   ANIONGAP 10 09/22/2019   GFR 63.57 10/27/2019   Lab Results  Component Value Date   CHOL 131 05/23/2020  Lab Results  Component Value Date   HDL 64 05/23/2020   Lab Results  Component Value Date   LDLCALC 47 05/23/2020   Lab Results  Component Value Date   TRIG 115 05/23/2020   Lab Results  Component Value Date   CHOLHDL 2.0 05/23/2020   Lab Results  Component Value Date   HGBA1C 5.8  11/18/2016      Assessment & Plan:   #1 chronic back pain with severe degenerative spondylosis  -Refill tramadol to take 1 every 8 hours as needed for severe pain  #2 history of chronic atrial fibrillation.  Followed by cardiology.  Patient on Eliquis and amiodarone  #3 recent elevated TSH.  She plans to follow-up with endocrinology regarding this  #4 B12 deficiency.  Continue monthly injections.  Get follow-up B12 level with next labs  Meds ordered this encounter  Medications  . traMADol (ULTRAM) 50 MG tablet    Sig: Take 1 tablet (50 mg total) by mouth every 8 (eight) hours as needed for up to 5 days.    Dispense:  30 tablet    Refill:  1    Follow-up: Return in about 6 months (around 12/06/2020).    Carolann Littler, MD

## 2020-06-08 NOTE — Patient Instructions (Signed)
I refilled the Tramadol  Be sure to follow up with Dr Buddy Duty regarding the abnormal  TSH value.

## 2020-06-12 DIAGNOSIS — Z7689 Persons encountering health services in other specified circumstances: Secondary | ICD-10-CM | POA: Diagnosis not present

## 2020-06-12 DIAGNOSIS — Z8639 Personal history of other endocrine, nutritional and metabolic disease: Secondary | ICD-10-CM | POA: Diagnosis not present

## 2020-06-12 DIAGNOSIS — R7309 Other abnormal glucose: Secondary | ICD-10-CM | POA: Diagnosis not present

## 2020-06-14 NOTE — Patient Instructions (Signed)
Visit Information  Goals Addressed            This Visit's Progress   . Chronic Care Management       CARE PLAN ENTRY (see longitudinal plan of care for additional care plan information)  Current Barriers:  . Chronic Disease Management support, education, and care coordination needs related to Hypertension, Hyperlipidemia, Atrial Fibrillation, Coronary Artery Disease, Hypothyroidism and Osteoporosis    Hypertension BP Readings from Last 3 Encounters:  06/08/20 128/60  05/28/20 (!) 156/68  05/23/20 138/78   . Pharmacist Clinical Goal(s): o Over the next 120 days, patient will work with PharmD and providers to maintain BP goal <140/90 . Current regimen:   Losartan 100 mg daily   Spirinolactone 25 mg daily   . Interventions: o Discussed low salt diet and exercising as tolerated extensively . Patient self care activities - Over the next 120 days, patient will: o Check blood pressure weekly, document, and provide at future appointments o Ensure daily salt intake < 2300 mg/day o Patient will look at [facility] catalog to see if she is able to get a new BP cuff  Hyperlipidemia Lab Results  Component Value Date/Time   LDLCALC 47 05/23/2020 02:31 PM   . Pharmacist Clinical Goal(s): o Over the next 90 days, patient will work with PharmD and providers to maintain LDL goal < 70 . Current regimen:  o Atorvastatin 40 mg daily  . Interventions: o Discussed low cholesterol diet and exercising as tolerated extensively  Medication management . Pharmacist Clinical Goal(s): o Over the next 120 days, patient will work with PharmD and providers to maintain optimal medication adherence . Current pharmacy: [facility] . Interventions o Comprehensive medication review performed. o Continue current medication management strategy . Patient self care activities - Over the next 120 days, patient will: o Take medications as prescribed o Report any questions or concerns to PharmD and/or  provider(s)      There are no care plans to display for this patient.    The patient verbalized understanding of instructions, educational materials, and care plan provided today and declined offer to receive copy of patient instructions, educational materials, and care plan.  Telephone follow up appointment with pharmacy team member scheduled for: 4 months  Viona Gilmore, [facility]

## 2020-06-20 ENCOUNTER — Encounter: Payer: Self-pay | Admitting: Cardiovascular Disease

## 2020-06-20 ENCOUNTER — Ambulatory Visit: Payer: Medicare Other | Admitting: Cardiovascular Disease

## 2020-06-20 ENCOUNTER — Other Ambulatory Visit: Payer: Self-pay

## 2020-06-20 ENCOUNTER — Ambulatory Visit: Payer: Medicare Other | Admitting: Physician Assistant

## 2020-06-20 VITALS — BP 120/60 | HR 51 | Ht 64.0 in | Wt 88.2 lb

## 2020-06-20 DIAGNOSIS — I2510 Atherosclerotic heart disease of native coronary artery without angina pectoris: Secondary | ICD-10-CM | POA: Diagnosis not present

## 2020-06-20 DIAGNOSIS — R7989 Other specified abnormal findings of blood chemistry: Secondary | ICD-10-CM

## 2020-06-20 DIAGNOSIS — I340 Nonrheumatic mitral (valve) insufficiency: Secondary | ICD-10-CM

## 2020-06-20 DIAGNOSIS — I4819 Other persistent atrial fibrillation: Secondary | ICD-10-CM

## 2020-06-20 MED ORDER — AMIODARONE HCL 100 MG PO TABS: 100.0000 mg | ORAL_TABLET | Freq: Every day | ORAL | 3 refills | Status: DC

## 2020-06-20 NOTE — Progress Notes (Signed)
Cardiology Office Note:    Date:  06/21/2020   ID:  [name], DOB 04/10/1933, MRN [id number]  PCP:  [name], [place]  Cardiologist:  Sally Mocha, MD  Advanced Practice Provider:  No care team member to display Electrophysiologist:  None       Referring MD: Sally Post, MD   Chief Complaint  Patient presents with  . Atrial Fibrillation    History of Present Illness:    [name] is a 87 y.o. female with a hx of:  Coronary artery disease  ? S/p CABG in 2007  Chronic anemia  Mitral regurgitation, severe ? Probably 2/2 ischemic MR w restriction of Valdez leaflet  HFmrEF (heart failure with mildly reduced ejection fraction)  Carotid artery disease  Paroxysmal atrial fibrillation-failed cardioversion December 2021, maintaining sinus rhythm after amiodarone and repeat cardioversion January 2022  Lumbar HNP   Hypertension   Hyperlipidemia   Hypothyroidism   The patient is here with her husband today. She is having a lot of problems. She's mostly limited by low back pain from a compression fracture in her back. Also has cervical disk problems. She reports very poor appetite - foods 'just don't taste right' to her and this has impacted her appetite. She denies nausea or vomiting. She continues to have some shortness of breath but feels much better than she did when she was in atrial fibrillation. No leg swelling, orthopnea, or PND.   Past Medical History:  Diagnosis Date  . Allergy   . Anemia, pernicious   . Arthritis    bursitis   . Atrophic gastritis   . B12 deficiency   . CAD (coronary artery disease)   . Carotid artery stenosis   . Cataract   . Diarrhea    on and off for months  . Family history of adverse reaction to anesthesia    extreme nausea brother and sister  . Fibromyalgia   . Gallbladder polyp   . GERD (gastroesophageal reflux disease)   . Heart murmur   . History of hiatal hernia    . History of kidney stones   . HLD (hyperlipidemia)   . HTN (hypertension)   . Hypothyroidism   . Irritable bowel syndrome (IBS)   . Median arcuate ligament syndrome ([place])   . Osteoporosis   . PVD (peripheral vascular disease) ([place])   . Tubular adenoma of colon 08/2013   with focal high grade dysplasia    Past Surgical History:  Procedure Laterality Date  . ABDOMINAL HYSTERECTOMY    . APPENDECTOMY    . BIOPSY THYROID    . CARDIOVERSION N/A 04/03/2020   Procedure: CARDIOVERSION;  Surgeon: Sally Spark, MD;  Location: [facility];  Service: Cardiovascular;  Laterality: N/A;  . cataract surgery    . CHOLECYSTECTOMY N/A 10/24/2014   Procedure: MICROSCOPIC  CHOLECYSTECTOMY;  Surgeon: Sally Ok, MD;  Location: [facility];  Service: General;  Laterality: N/A;  . COLONOSCOPY    . CORONARY ARTERY BYPASS GRAFT  07  . ESOPHAGEAL MANOMETRY N/A 08/02/2012   Procedure: ESOPHAGEAL MANOMETRY (EM);  Surgeon: Sally Feil, MD;  Location: [facility];  Service: Endoscopy;  Laterality: N/A;  . ESOPHAGOGASTRODUODENOSCOPY (EGD) WITH PROPOFOL N/A 04/07/2019   Procedure: ESOPHAGOGASTRODUODENOSCOPY (EGD) WITH PROPOFOL;  Surgeon: Sally Banister, MD;  Location: [facility];  Service: Endoscopy;  Laterality: N/A;  . HEMOSTASIS CLIP PLACEMENT  04/07/2019   Procedure: HEMOSTASIS CLIP PLACEMENT;  Surgeon: [name]  P, MD;  Location: [facility];  Service: Endoscopy;;  . HOT HEMOSTASIS N/A 04/07/2019   Procedure: HOT HEMOSTASIS (ARGON PLASMA COAGULATION/BICAP);  Surgeon: Sally Banister, MD;  Location: [facility];  Service: Endoscopy;  Laterality: N/A;  . POLYPECTOMY  04/07/2019   Procedure: POLYPECTOMY;  Surgeon: Sally Banister, MD;  Location: [facility];  Service: Endoscopy;;  . [name]  04/07/2019   Procedure: [name];  Surgeon: Sally Banister, MD;  Location: [facility];  Service: Endoscopy;;  . TEE WITHOUT CARDIOVERSION N/A 04/03/2020   Procedure: TRANSESOPHAGEAL  ECHOCARDIOGRAM (TEE);  Surgeon: Sally Spark, MD;  Location: [facility];  Service: Cardiovascular;  Laterality: N/A;  . THYROID LOBECTOMY    . UPPER GASTROINTESTINAL ENDOSCOPY      Current Medications: Current Meds  Medication Sig  . acetaminophen (TYLENOL) 500 MG tablet Take 500 mg by mouth every 6 (six) hours as needed for mild pain.  Marland Kitchen atorvastatin (LIPITOR) 80 MG tablet Take 0.5 tablets (40 mg total) by mouth at bedtime.  . Calcium Carbonate-Vitamin D (CALTRATE 600+D PO) Take 1 tablet by mouth daily with lunch.   . cyanocobalamin (,VITAMIN B-12,) 1000 MCG/ML injection Inject 1,000 mcg into the muscle every 30 (thirty) days.  Marland Kitchen ELIQUIS 2.5 MG TABS tablet TAKE 1 TABLET BY MOUTH TWICE DAILY  . gabapentin (NEURONTIN) 100 MG capsule Take 1 capsule (100 mg total) by mouth 2 (two) times daily.  . hyoscyamine (LEVSIN SL) 0.125 MG SL tablet Place 1 tablet (0.125 mg total) under the tongue every 6 (six) hours as needed.  Marland Kitchen ipratropium (ATROVENT) 0.03 % nasal spray Place 2 sprays into both nostrils every 12 (twelve) hours.  Marland Kitchen lactase (LACTAID) 3000 units tablet Take 3,000 Units by mouth 3 (three) times daily with meals as needed (lactose Intolerance).   Marland Kitchen levothyroxine (SYNTHROID, LEVOTHROID) 50 MCG tablet Take 50 mcg by mouth at bedtime.  . Lidocaine 4 % OINT Apply 1 application topically 3 (three) times daily as needed (pain). Roll-On (otc)  . Lidocaine 4 % PTCH Place 1 patch onto the skin daily as needed (pain.).   Marland Kitchen losartan (COZAAR) 100 MG tablet Take 1 tablet (100 mg total) by mouth daily.  . Multiple Vitamins-Minerals (PRESERVISION AREDS 2 PO) Take 1 tablet by mouth 2 (two) times daily.  Vladimir Faster Glycol-Propyl Glycol (SYSTANE FREE OP) Place 1 drop into both eyes 3 (three) times daily as needed (dry/irritated eyes.).  Marland Kitchen saccharomyces boulardii (FLORASTOR) 250 MG capsule Take 250 mg by mouth 2 (two) times daily.  . sodium chloride (OCEAN) 0.65 % SOLN nasal spray Place 1 spray into  both nostrils as needed for congestion.  Marland Kitchen spironolactone (ALDACTONE) 25 MG tablet TAKE 1 TABLET BY MOUTH ONCE DAILY  . sucralfate (CARAFATE) 1 GM/10ML suspension TAKE 10 MLS (1 G TOTAL) BY MOUTH 2 (TWO) TIMES DAILY.  . [DISCONTINUED] amiodarone (PACERONE) 200 MG tablet Take 200 mg by mouth daily.     Allergies:   Actos [pioglitazone], Sulfonamide derivatives, Adhesive [tape], and Codeine   Social History   Socioeconomic History  . Marital status: Married    Spouse name: [name]  . Number of children: 0  . Years of education: 33  . Highest education level: Not on file  Occupational History  . Occupation: retired    Fish farm manager: RETIRED  Tobacco Use  . Smoking status: Never Smoker  . Smokeless tobacco: Never Used  Vaping Use  . Vaping Use: Never used  Substance and Sexual Activity  . Alcohol use: No  Alcohol/week: 0.0 standard drinks  . Drug use: No  . Sexual activity: Not on file  Other Topics Concern  . Not on file  Social History Narrative   [name], Married. Lives with husband. Had been a very active woman.   Social Determinants of Health   Financial Resource Strain: Low Risk   . Difficulty of Paying Living Expenses: Not hard at all  Food Insecurity: Not on file  Transportation Needs: No Transportation Needs  . Lack of Transportation (Medical): No  . Lack of Transportation (Non-Medical): No  Physical Activity: Not on file  Stress: Not on file  Social Connections: Not on file     Family History: The patient's family history includes Breast cancer in an other family member; Cholelithiasis in her mother; Colon cancer in her brother; Diabetes in her sister; Heart disease in her mother; Lung cancer in her father and sister; Prostate cancer in her brother; Stomach cancer in her cousin. There is no history of Esophageal cancer or Rectal cancer.  ROS:   Please see the history of present illness.    All other systems reviewed and are negative.  EKGs/Labs/Other Studies  Reviewed:    The following studies were reviewed today: TEE Apr 12, 2020: IMPRESSIONS    1. Severe mitral regurgitation, prolapse of the P1,2 and A2 scallopes,  multiple jets, one dominant anteriorly directed. PISA radius 1.0, ERO 0.48  cm2, Reg Vol 84 ml. Moderate pulmonary hypertension, RVSP 47 mmHg.  2. Left ventricular ejection fraction, by estimation, is 50 to 55%. The  left ventricle has low normal function. The left ventricle has no regional  wall motion abnormalities. Left ventricular diastolic function could not  be evaluated.  3. Right ventricular systolic function is normal. The right ventricular  size is normal. There is moderately elevated pulmonary artery systolic  pressure. The estimated right ventricular systolic pressure is 38.2 mmHg.  4. Left atrial size was severely dilated. No left atrial/left atrial  appendage thrombus was detected. The LAA emptying velocity was 40 cm/s.  5. Right atrial size was severely dilated.  6. The mitral valve is degenerative. No evidence of mitral valve  regurgitation. No evidence of mitral stenosis. There is moderate prolapse  of both leaflets of the mitral valve.  7. Tricuspid valve regurgitation is moderate.  8. The aortic valve is tricuspid. Aortic valve regurgitation is moderate  to severe. No aortic stenosis is present.  9. The inferior vena cava is normal in size with greater than 50%  respiratory variability, suggesting right atrial pressure of 3 mmHg.   Conclusion(s)/Recommendation(s): No LA/LAA thrombus identified. Successful  cardioversion performed with restoration of normal sinus rhythm.   EKG:  EKG is not ordered today.   Recent Labs: 05/23/2020: BUN 27; Creatinine, Ser 1.10; Hemoglobin 11.4; Platelets 237; Potassium 4.9; Sodium 139; TSH 12.700 06/20/2020: ALT 71  Recent Lipid Panel    Component Value Date/Time   CHOL 131 05/23/2020 1431   TRIG 115 05/23/2020 1431   TRIG 85 02/05/2006 0818   HDL 64 05/23/2020  1431   CHOLHDL 2.0 05/23/2020 1431   CHOLHDL 2 11/22/2014 0919   VLDL 10.6 11/22/2014 0919   LDLCALC 47 05/23/2020 1431     Risk Assessment/Calculations:    CHA2DS2-VASc Score = 6  This indicates a 9.7% annual risk of stroke. The patient's score is based upon: CHF History: Yes HTN History: Yes Diabetes History: No Stroke History: No Vascular Disease History: Yes Age Score: 2 Gender Score: 1      Physical Exam:  VS:  BP 120/60   Pulse (!) 51   Ht 5\' 4"  (1.626 m)   Wt 88 lb 3.2 oz (40 kg)   SpO2 97%   BMI 15.14 kg/m     Wt Readings from Last 3 Encounters:  06/20/20 88 lb 3.2 oz (40 kg)  06/08/20 88 lb (39.9 kg)  05/28/20 91 lb (41.3 kg)     GEN: very pleasant, thin, elderly woman in no acute distress HEENT: Normal NECK: No JVD; No carotid bruits LYMPHATICS: No lymphadenopathy CARDIAC: RRR, 3/6 holosystolic murmur at the apex RESPIRATORY:  Clear to auscultation without rales, wheezing or rhonchi  ABDOMEN: Soft, non-tender, non-distended MUSCULOSKELETAL:  No edema; No deformity  SKIN: Warm and dry NEUROLOGIC:  Alert and oriented x 3 PSYCHIATRIC:  Normal affect   ASSESSMENT:    1. Elevated LFTs   2. Persistent atrial fibrillation ([place])   3. Nonrheumatic mitral valve regurgitation   4. Coronary artery disease involving native coronary artery of native heart without angina pectoris    PLAN:    In order of problems listed above:  1. LFTs have been elevated with most recent AST and ALT of 84 and 99.  Statin drug was reduced.  Repeated labs today with AST 59, ALT 71, both improving.  Will reduce amiodarone (see discussion below). 2. Patient maintaining sinus rhythm.  Clearly has improved symptomatically with restoration of sinus rhythm.  However, continues to have trouble with her appetite and changes in her taste.  Will reduce amiodarone further to 100 mg daily. 3. TEE images reviewed.  Patient demonstrates findings consistent with severe mitral  regurgitation.  Considering all of her current issues, I think ongoing surveillance is most appropriate. 4. No angina at present.  Continue current medical management.  Medication Adjustments/Labs and Tests Ordered: Current medicines are reviewed at length with the patient today.  Concerns regarding medicines are outlined above.  Orders Placed This Encounter  Procedures  . Hepatic function panel   Meds ordered this encounter  Medications  . amiodarone (PACERONE) 100 MG tablet    Sig: Take 1 tablet (100 mg total) by mouth daily.    Dispense:  90 tablet    Refill:  3    Patient Instructions  Medication Instructions:  1) DECREASE AMIODARONE to 100 mg daily *If you need a refill on your cardiac medications before your next appointment, please call your pharmacy*  Lab Work: TODAY! LFTs If you have labs (blood work) drawn today and your tests are completely normal, you will receive your results only by: Marland Kitchen MyChart Message (if you have MyChart) OR . A paper copy in the mail If you have any lab test that is abnormal or we need to change your treatment, we will call you to review the results.  Follow-Up: Your provider recommends that you schedule a follow-up appointment in 3 months with Richardson Dopp, PA.    Signed, Sally Mocha, MD  06/21/2020 5:48 AM    [place]

## 2020-06-20 NOTE — Patient Instructions (Addendum)
Medication Instructions:  1) DECREASE AMIODARONE to 100 mg daily *If you need a refill on your cardiac medications before your next appointment, please call your pharmacy*  Lab Work: TODAY! LFTs If you have labs (blood work) drawn today and your tests are completely normal, you will receive your results only by: Marland Kitchen MyChart Message (if you have MyChart) OR . A paper copy in the mail If you have any lab test that is abnormal or we need to change your treatment, we will call you to review the results.  Follow-Up: Your provider recommends that you schedule a follow-up appointment in 3 months with Richardson Dopp, PA.

## 2020-06-21 ENCOUNTER — Encounter: Payer: Self-pay | Admitting: Cardiovascular Disease

## 2020-06-21 LAB — HEPATIC FUNCTION PANEL
ALT: 71 IU/L — ABNORMAL HIGH (ref 0–32)
AST: 59 IU/L — ABNORMAL HIGH (ref 0–40)
Albumin: 3.8 g/dL (ref 3.6–4.6)
Alkaline Phosphatase: 76 IU/L (ref 44–121)
Bilirubin Total: 0.3 mg/dL (ref 0.0–1.2)
Bilirubin, Direct: 0.13 mg/dL (ref 0.00–0.40)
Total Protein: 6.9 g/dL (ref 6.0–8.5)

## 2020-06-28 ENCOUNTER — Other Ambulatory Visit: Payer: Self-pay | Admitting: Family Medicine

## 2020-06-28 NOTE — Telephone Encounter (Signed)
Is it okay for refills of Eliquis to be sent to the pharmacy?  Last office visit- 06/08/20

## 2020-07-04 ENCOUNTER — Encounter (HOSPITAL_COMMUNITY): Payer: Self-pay

## 2020-07-04 ENCOUNTER — Inpatient Hospital Stay (HOSPITAL_COMMUNITY): Payer: Medicare Other

## 2020-07-04 ENCOUNTER — Emergency Department (HOSPITAL_COMMUNITY): Payer: Medicare Other

## 2020-07-04 ENCOUNTER — Other Ambulatory Visit: Payer: Self-pay

## 2020-07-04 ENCOUNTER — Inpatient Hospital Stay (HOSPITAL_COMMUNITY)
Admission: EM | Admit: 2020-07-04 | Discharge: 2020-07-13 | DRG: 288 | Disposition: A | Discharge status: Skilled Nursing Facility | Payer: Medicare Other | Attending: Internal Medicine | Admitting: Internal Medicine

## 2020-07-04 DIAGNOSIS — E785 Hyperlipidemia, unspecified: Secondary | ICD-10-CM | POA: Diagnosis not present

## 2020-07-04 DIAGNOSIS — M4646 Discitis, unspecified, lumbar region: Secondary | ICD-10-CM | POA: Diagnosis not present

## 2020-07-04 DIAGNOSIS — I248 Other forms of acute ischemic heart disease: Secondary | ICD-10-CM | POA: Diagnosis not present

## 2020-07-04 DIAGNOSIS — Z743 Need for continuous supervision: Secondary | ICD-10-CM | POA: Diagnosis not present

## 2020-07-04 DIAGNOSIS — B9620 Unspecified Escherichia coli [E. coli] as the cause of diseases classified elsewhere: Secondary | ICD-10-CM | POA: Diagnosis present

## 2020-07-04 DIAGNOSIS — I2510 Atherosclerotic heart disease of native coronary artery without angina pectoris: Secondary | ICD-10-CM | POA: Diagnosis not present

## 2020-07-04 DIAGNOSIS — I330 Acute and subacute infective endocarditis: Secondary | ICD-10-CM | POA: Diagnosis not present

## 2020-07-04 DIAGNOSIS — S32010D Wedge compression fracture of first lumbar vertebra, subsequent encounter for fracture with routine healing: Secondary | ICD-10-CM | POA: Diagnosis not present

## 2020-07-04 DIAGNOSIS — Z66 Do not resuscitate: Secondary | ICD-10-CM | POA: Diagnosis not present

## 2020-07-04 DIAGNOSIS — Z885 Allergy status to narcotic agent status: Secondary | ICD-10-CM

## 2020-07-04 DIAGNOSIS — I4819 Other persistent atrial fibrillation: Secondary | ICD-10-CM | POA: Diagnosis present

## 2020-07-04 DIAGNOSIS — S32010S Wedge compression fracture of first lumbar vertebra, sequela: Secondary | ICD-10-CM | POA: Diagnosis not present

## 2020-07-04 DIAGNOSIS — W1830XA Fall on same level, unspecified, initial encounter: Secondary | ICD-10-CM | POA: Diagnosis present

## 2020-07-04 DIAGNOSIS — M4640 Discitis, unspecified, site unspecified: Secondary | ICD-10-CM | POA: Diagnosis not present

## 2020-07-04 DIAGNOSIS — G629 Polyneuropathy, unspecified: Secondary | ICD-10-CM | POA: Diagnosis present

## 2020-07-04 DIAGNOSIS — R55 Syncope and collapse: Secondary | ICD-10-CM | POA: Diagnosis not present

## 2020-07-04 DIAGNOSIS — I059 Rheumatic mitral valve disease, unspecified: Secondary | ICD-10-CM | POA: Diagnosis not present

## 2020-07-04 DIAGNOSIS — E872 Acidosis: Secondary | ICD-10-CM | POA: Diagnosis not present

## 2020-07-04 DIAGNOSIS — N179 Acute kidney failure, unspecified: Secondary | ICD-10-CM | POA: Diagnosis present

## 2020-07-04 DIAGNOSIS — I351 Nonrheumatic aortic (valve) insufficiency: Secondary | ICD-10-CM | POA: Diagnosis not present

## 2020-07-04 DIAGNOSIS — M47816 Spondylosis without myelopathy or radiculopathy, lumbar region: Secondary | ICD-10-CM | POA: Diagnosis present

## 2020-07-04 DIAGNOSIS — B952 Enterococcus as the cause of diseases classified elsewhere: Secondary | ICD-10-CM | POA: Diagnosis not present

## 2020-07-04 DIAGNOSIS — M47812 Spondylosis without myelopathy or radiculopathy, cervical region: Secondary | ICD-10-CM | POA: Diagnosis not present

## 2020-07-04 DIAGNOSIS — I10 Essential (primary) hypertension: Secondary | ICD-10-CM | POA: Diagnosis not present

## 2020-07-04 DIAGNOSIS — S32019A Unspecified fracture of first lumbar vertebra, initial encounter for closed fracture: Secondary | ICD-10-CM | POA: Diagnosis not present

## 2020-07-04 DIAGNOSIS — R42 Dizziness and giddiness: Secondary | ICD-10-CM | POA: Diagnosis not present

## 2020-07-04 DIAGNOSIS — Z951 Presence of aortocoronary bypass graft: Secondary | ICD-10-CM

## 2020-07-04 DIAGNOSIS — D649 Anemia, unspecified: Secondary | ICD-10-CM | POA: Diagnosis not present

## 2020-07-04 DIAGNOSIS — R7881 Bacteremia: Secondary | ICD-10-CM | POA: Diagnosis not present

## 2020-07-04 DIAGNOSIS — M25572 Pain in left ankle and joints of left foot: Secondary | ICD-10-CM | POA: Diagnosis not present

## 2020-07-04 DIAGNOSIS — N390 Urinary tract infection, site not specified: Secondary | ICD-10-CM | POA: Diagnosis not present

## 2020-07-04 DIAGNOSIS — Y92009 Unspecified place in unspecified non-institutional (private) residence as the place of occurrence of the external cause: Secondary | ICD-10-CM | POA: Diagnosis not present

## 2020-07-04 DIAGNOSIS — I447 Left bundle-branch block, unspecified: Secondary | ICD-10-CM | POA: Diagnosis not present

## 2020-07-04 DIAGNOSIS — I358 Other nonrheumatic aortic valve disorders: Secondary | ICD-10-CM | POA: Diagnosis not present

## 2020-07-04 DIAGNOSIS — J3489 Other specified disorders of nose and nasal sinuses: Secondary | ICD-10-CM | POA: Diagnosis not present

## 2020-07-04 DIAGNOSIS — Y92012 Bathroom of single-family (private) house as the place of occurrence of the external cause: Secondary | ICD-10-CM

## 2020-07-04 DIAGNOSIS — E871 Hypo-osmolality and hyponatremia: Secondary | ICD-10-CM | POA: Diagnosis not present

## 2020-07-04 DIAGNOSIS — J9811 Atelectasis: Secondary | ICD-10-CM | POA: Diagnosis not present

## 2020-07-04 DIAGNOSIS — E039 Hypothyroidism, unspecified: Secondary | ICD-10-CM | POA: Diagnosis present

## 2020-07-04 DIAGNOSIS — Z7901 Long term (current) use of anticoagulants: Secondary | ICD-10-CM

## 2020-07-04 DIAGNOSIS — E538 Deficiency of other specified B group vitamins: Secondary | ICD-10-CM | POA: Diagnosis present

## 2020-07-04 DIAGNOSIS — W19XXXA Unspecified fall, initial encounter: Secondary | ICD-10-CM | POA: Diagnosis not present

## 2020-07-04 DIAGNOSIS — I616 Nontraumatic intracerebral hemorrhage, multiple localized: Secondary | ICD-10-CM | POA: Diagnosis not present

## 2020-07-04 DIAGNOSIS — S36119A Unspecified injury of liver, initial encounter: Secondary | ICD-10-CM | POA: Diagnosis not present

## 2020-07-04 DIAGNOSIS — M48061 Spinal stenosis, lumbar region without neurogenic claudication: Secondary | ICD-10-CM | POA: Diagnosis not present

## 2020-07-04 DIAGNOSIS — R000 Tachycardia, unspecified: Secondary | ICD-10-CM | POA: Diagnosis not present

## 2020-07-04 DIAGNOSIS — L89151 Pressure ulcer of sacral region, stage 1: Secondary | ICD-10-CM | POA: Diagnosis present

## 2020-07-04 DIAGNOSIS — S32010A Wedge compression fracture of first lumbar vertebra, initial encounter for closed fracture: Secondary | ICD-10-CM | POA: Diagnosis present

## 2020-07-04 DIAGNOSIS — I25810 Atherosclerosis of coronary artery bypass graft(s) without angina pectoris: Secondary | ICD-10-CM | POA: Diagnosis not present

## 2020-07-04 DIAGNOSIS — I339 Acute and subacute endocarditis, unspecified: Secondary | ICD-10-CM | POA: Diagnosis not present

## 2020-07-04 DIAGNOSIS — S22089A Unspecified fracture of T11-T12 vertebra, initial encounter for closed fracture: Secondary | ICD-10-CM | POA: Diagnosis not present

## 2020-07-04 DIAGNOSIS — M4637 Infection of intervertebral disc (pyogenic), lumbosacral region: Secondary | ICD-10-CM | POA: Diagnosis not present

## 2020-07-04 DIAGNOSIS — R6889 Other general symptoms and signs: Secondary | ICD-10-CM | POA: Diagnosis not present

## 2020-07-04 DIAGNOSIS — Z882 Allergy status to sulfonamides status: Secondary | ICD-10-CM

## 2020-07-04 DIAGNOSIS — I350 Nonrheumatic aortic (valve) stenosis: Secondary | ICD-10-CM | POA: Diagnosis not present

## 2020-07-04 DIAGNOSIS — Z681 Body mass index (BMI) 19 or less, adult: Secondary | ICD-10-CM

## 2020-07-04 DIAGNOSIS — I5022 Chronic systolic (congestive) heart failure: Secondary | ICD-10-CM | POA: Diagnosis not present

## 2020-07-04 DIAGNOSIS — Z888 Allergy status to other drugs, medicaments and biological substances status: Secondary | ICD-10-CM

## 2020-07-04 DIAGNOSIS — M797 Fibromyalgia: Secondary | ICD-10-CM | POA: Diagnosis present

## 2020-07-04 DIAGNOSIS — Z20822 Contact with and (suspected) exposure to covid-19: Secondary | ICD-10-CM | POA: Diagnosis not present

## 2020-07-04 DIAGNOSIS — K2940 Chronic atrophic gastritis without bleeding: Secondary | ICD-10-CM | POA: Diagnosis present

## 2020-07-04 DIAGNOSIS — I083 Combined rheumatic disorders of mitral, aortic and tricuspid valves: Secondary | ICD-10-CM | POA: Diagnosis present

## 2020-07-04 DIAGNOSIS — R279 Unspecified lack of coordination: Secondary | ICD-10-CM | POA: Diagnosis not present

## 2020-07-04 DIAGNOSIS — Z79890 Hormone replacement therapy (postmenopausal): Secondary | ICD-10-CM

## 2020-07-04 DIAGNOSIS — M4620 Osteomyelitis of vertebra, site unspecified: Secondary | ICD-10-CM | POA: Diagnosis not present

## 2020-07-04 DIAGNOSIS — E43 Unspecified severe protein-calorie malnutrition: Secondary | ICD-10-CM | POA: Diagnosis present

## 2020-07-04 DIAGNOSIS — G8929 Other chronic pain: Secondary | ICD-10-CM | POA: Diagnosis not present

## 2020-07-04 DIAGNOSIS — M7989 Other specified soft tissue disorders: Secondary | ICD-10-CM | POA: Diagnosis not present

## 2020-07-04 DIAGNOSIS — Z79899 Other long term (current) drug therapy: Secondary | ICD-10-CM

## 2020-07-04 DIAGNOSIS — G319 Degenerative disease of nervous system, unspecified: Secondary | ICD-10-CM | POA: Diagnosis not present

## 2020-07-04 DIAGNOSIS — R54 Age-related physical debility: Secondary | ICD-10-CM | POA: Diagnosis present

## 2020-07-04 DIAGNOSIS — K5900 Constipation, unspecified: Secondary | ICD-10-CM | POA: Diagnosis not present

## 2020-07-04 DIAGNOSIS — K219 Gastro-esophageal reflux disease without esophagitis: Secondary | ICD-10-CM | POA: Diagnosis not present

## 2020-07-04 DIAGNOSIS — N200 Calculus of kidney: Secondary | ICD-10-CM | POA: Diagnosis not present

## 2020-07-04 DIAGNOSIS — M4319 Spondylolisthesis, multiple sites in spine: Secondary | ICD-10-CM | POA: Diagnosis not present

## 2020-07-04 DIAGNOSIS — I739 Peripheral vascular disease, unspecified: Secondary | ICD-10-CM | POA: Diagnosis present

## 2020-07-04 DIAGNOSIS — M5459 Other low back pain: Secondary | ICD-10-CM | POA: Diagnosis not present

## 2020-07-04 DIAGNOSIS — Z833 Family history of diabetes mellitus: Secondary | ICD-10-CM

## 2020-07-04 DIAGNOSIS — W19XXXD Unspecified fall, subsequent encounter: Secondary | ICD-10-CM | POA: Diagnosis not present

## 2020-07-04 DIAGNOSIS — S32011A Stable burst fracture of first lumbar vertebra, initial encounter for closed fracture: Secondary | ICD-10-CM | POA: Diagnosis not present

## 2020-07-04 DIAGNOSIS — Z8249 Family history of ischemic heart disease and other diseases of the circulatory system: Secondary | ICD-10-CM

## 2020-07-04 DIAGNOSIS — I499 Cardiac arrhythmia, unspecified: Secondary | ICD-10-CM | POA: Diagnosis not present

## 2020-07-04 DIAGNOSIS — I110 Hypertensive heart disease with heart failure: Secondary | ICD-10-CM | POA: Diagnosis not present

## 2020-07-04 DIAGNOSIS — I340 Nonrheumatic mitral (valve) insufficiency: Secondary | ICD-10-CM | POA: Diagnosis not present

## 2020-07-04 DIAGNOSIS — Z801 Family history of malignant neoplasm of trachea, bronchus and lung: Secondary | ICD-10-CM

## 2020-07-04 DIAGNOSIS — R0902 Hypoxemia: Secondary | ICD-10-CM | POA: Diagnosis not present

## 2020-07-04 DIAGNOSIS — R7401 Elevation of levels of liver transaminase levels: Secondary | ICD-10-CM | POA: Diagnosis not present

## 2020-07-04 DIAGNOSIS — K3189 Other diseases of stomach and duodenum: Secondary | ICD-10-CM | POA: Diagnosis not present

## 2020-07-04 DIAGNOSIS — M4645 Discitis, unspecified, thoracolumbar region: Secondary | ICD-10-CM | POA: Diagnosis not present

## 2020-07-04 DIAGNOSIS — E8809 Other disorders of plasma-protein metabolism, not elsewhere classified: Secondary | ICD-10-CM | POA: Diagnosis not present

## 2020-07-04 DIAGNOSIS — R7989 Other specified abnormal findings of blood chemistry: Secondary | ICD-10-CM | POA: Diagnosis not present

## 2020-07-04 DIAGNOSIS — M5126 Other intervertebral disc displacement, lumbar region: Secondary | ICD-10-CM | POA: Diagnosis not present

## 2020-07-04 DIAGNOSIS — Z800 Family history of malignant neoplasm of digestive organs: Secondary | ICD-10-CM

## 2020-07-04 DIAGNOSIS — L8990 Pressure ulcer of unspecified site, unspecified stage: Secondary | ICD-10-CM | POA: Diagnosis present

## 2020-07-04 DIAGNOSIS — J0110 Acute frontal sinusitis, unspecified: Secondary | ICD-10-CM | POA: Diagnosis not present

## 2020-07-04 DIAGNOSIS — M792 Neuralgia and neuritis, unspecified: Secondary | ICD-10-CM | POA: Diagnosis not present

## 2020-07-04 DIAGNOSIS — M810 Age-related osteoporosis without current pathological fracture: Secondary | ICD-10-CM | POA: Diagnosis present

## 2020-07-04 DIAGNOSIS — I361 Nonrheumatic tricuspid (valve) insufficiency: Secondary | ICD-10-CM | POA: Diagnosis not present

## 2020-07-04 DIAGNOSIS — Z8042 Family history of malignant neoplasm of prostate: Secondary | ICD-10-CM

## 2020-07-04 DIAGNOSIS — L8991 Pressure ulcer of unspecified site, stage 1: Secondary | ICD-10-CM | POA: Diagnosis not present

## 2020-07-04 DIAGNOSIS — J321 Chronic frontal sinusitis: Secondary | ICD-10-CM | POA: Diagnosis not present

## 2020-07-04 DIAGNOSIS — J90 Pleural effusion, not elsewhere classified: Secondary | ICD-10-CM | POA: Diagnosis not present

## 2020-07-04 DIAGNOSIS — K589 Irritable bowel syndrome without diarrhea: Secondary | ICD-10-CM | POA: Diagnosis present

## 2020-07-04 LAB — HEPATIC FUNCTION PANEL
ALT: 279 U/L — ABNORMAL HIGH (ref 0–44)
AST: 286 U/L — ABNORMAL HIGH (ref 15–41)
Albumin: 2.6 g/dL — ABNORMAL LOW (ref 3.5–5.0)
Alkaline Phosphatase: 81 U/L (ref 38–126)
Bilirubin, Direct: 0.4 mg/dL — ABNORMAL HIGH (ref 0.0–0.2)
Indirect Bilirubin: 0.6 mg/dL (ref 0.3–0.9)
Total Bilirubin: 1 mg/dL (ref 0.3–1.2)
Total Protein: 6.8 g/dL (ref 6.5–8.1)

## 2020-07-04 LAB — C-REACTIVE PROTEIN: CRP: 17.6 mg/dL — ABNORMAL HIGH (ref <1.0)

## 2020-07-04 LAB — BASIC METABOLIC PANEL
Anion gap: 11 (ref 5–15)
BUN: 47 mg/dL — ABNORMAL HIGH (ref 8–23)
CO2: 21 mmol/L — ABNORMAL LOW (ref 22–32)
Calcium: 8.7 mg/dL — ABNORMAL LOW (ref 8.9–10.3)
Chloride: 98 mmol/L (ref 98–111)
Creatinine, Ser: 1.48 mg/dL — ABNORMAL HIGH (ref 0.44–1.00)
GFR, Estimated: 34 mL/min — ABNORMAL LOW (ref >60)
Glucose, Bld: 114 mg/dL — ABNORMAL HIGH (ref 70–99)
Potassium: 4.6 mmol/L (ref 3.5–5.1)
Sodium: 130 mmol/L — ABNORMAL LOW (ref 135–145)

## 2020-07-04 LAB — URINALYSIS, ROUTINE W REFLEX MICROSCOPIC
Bilirubin Urine: NEGATIVE
Glucose, UA: NEGATIVE mg/dL
Ketones, ur: NEGATIVE mg/dL
Nitrite: NEGATIVE
Protein, ur: NEGATIVE mg/dL
Specific Gravity, Urine: 1.014 (ref 1.005–1.030)
pH: 5 (ref 5.0–8.0)

## 2020-07-04 LAB — TROPONIN I (HIGH SENSITIVITY)
Troponin I (High Sensitivity): 21 ng/L — ABNORMAL HIGH (ref <18)
Troponin I (High Sensitivity): 24 ng/L — ABNORMAL HIGH (ref <18)

## 2020-07-04 LAB — CBC
HCT: 37 % (ref 36.0–46.0)
Hemoglobin: 12.1 g/dL (ref 12.0–15.0)
MCH: 27.7 pg (ref 26.0–34.0)
MCHC: 32.7 g/dL (ref 30.0–36.0)
MCV: 84.7 fL (ref 80.0–100.0)
Platelets: 223 10*3/uL (ref 150–400)
RBC: 4.37 MIL/uL (ref 3.87–5.11)
RDW: 15.6 % — ABNORMAL HIGH (ref 11.5–15.5)
WBC: 17.6 10*3/uL — ABNORMAL HIGH (ref 4.0–10.5)
nRBC: 0 % (ref 0.0–0.2)

## 2020-07-04 LAB — TSH: TSH: 5.338 u[IU]/mL — ABNORMAL HIGH (ref 0.350–4.500)

## 2020-07-04 LAB — CK: Total CK: 136 U/L (ref 38–234)

## 2020-07-04 LAB — CBG MONITORING, ED: Glucose-Capillary: 105 mg/dL — ABNORMAL HIGH (ref 70–99)

## 2020-07-04 LAB — SEDIMENTATION RATE: Sed Rate: 44 mm/hr — ABNORMAL HIGH (ref 0–22)

## 2020-07-04 LAB — VITAMIN B12: Vitamin B-12: 1518 pg/mL — ABNORMAL HIGH (ref 180–914)

## 2020-07-04 MED ORDER — LIDOCAINE 5 % EX PTCH
1.0000 | MEDICATED_PATCH | Freq: Every day | CUTANEOUS | Status: DC | PRN
  Administered 2020-07-05: 1 via TRANSDERMAL
  Filled 2020-07-04 (×2): qty 1

## 2020-07-04 MED ORDER — TRAMADOL HCL 50 MG PO TABS
50.0000 mg | ORAL_TABLET | Freq: Two times a day (BID) | ORAL | Status: DC | PRN
  Administered 2020-07-05 – 2020-07-08 (×6): 50 mg via ORAL
  Filled 2020-07-04 (×6): qty 1

## 2020-07-04 MED ORDER — SACCHAROMYCES BOULARDII 250 MG PO CAPS
250.0000 mg | ORAL_CAPSULE | Freq: Every day | ORAL | Status: DC
  Administered 2020-07-05 – 2020-07-06 (×2): 250 mg via ORAL
  Filled 2020-07-04 (×2): qty 1

## 2020-07-04 MED ORDER — HYOSCYAMINE SULFATE 0.125 MG SL SUBL
0.1250 mg | SUBLINGUAL_TABLET | Freq: Four times a day (QID) | SUBLINGUAL | Status: DC | PRN
  Filled 2020-07-04: qty 1

## 2020-07-04 MED ORDER — SUCRALFATE 1 GM/10ML PO SUSP
1.0000 g | Freq: Two times a day (BID) | ORAL | Status: DC | PRN
  Filled 2020-07-04: qty 10

## 2020-07-04 MED ORDER — HYDROMORPHONE HCL 1 MG/ML IJ SOLN
0.5000 mg | INTRAMUSCULAR | Status: DC | PRN
  Administered 2020-07-04 (×2): 1 mg via INTRAVENOUS
  Administered 2020-07-05: 0.5 mg via INTRAVENOUS
  Administered 2020-07-05: 1 mg via INTRAVENOUS
  Filled 2020-07-04: qty 1
  Filled 2020-07-04: qty 0.5
  Filled 2020-07-04 (×2): qty 1

## 2020-07-04 MED ORDER — SODIUM CHLORIDE 0.9 % IV BOLUS
1000.0000 mL | Freq: Once | INTRAVENOUS | Status: AC
  Administered 2020-07-04: 1000 mL via INTRAVENOUS

## 2020-07-04 MED ORDER — LACTASE 3000 UNITS PO TABS
3000.0000 [IU] | ORAL_TABLET | Freq: Three times a day (TID) | ORAL | Status: DC | PRN
  Filled 2020-07-04 (×3): qty 1

## 2020-07-04 MED ORDER — TRAMADOL HCL 50 MG PO TABS: 50.0000 mg | ORAL_TABLET | ORAL | Status: DC | PRN

## 2020-07-04 MED ORDER — SODIUM CHLORIDE 0.9 % IV SOLN
1.0000 g | INTRAVENOUS | Status: DC
  Administered 2020-07-05 – 2020-07-06 (×2): 1 g via INTRAVENOUS
  Filled 2020-07-04 (×3): qty 10

## 2020-07-04 MED ORDER — FENTANYL CITRATE (PF) 100 MCG/2ML IJ SOLN
25.0000 ug | Freq: Once | INTRAMUSCULAR | Status: AC
  Administered 2020-07-04: 25 ug via INTRAVENOUS
  Filled 2020-07-04: qty 2

## 2020-07-04 MED ORDER — SALINE SPRAY 0.65 % NA SOLN
1.0000 | Freq: Three times a day (TID) | NASAL | Status: DC | PRN
  Filled 2020-07-04: qty 44

## 2020-07-04 MED ORDER — IPRATROPIUM BROMIDE 0.06 % NA SOLN
1.0000 | Freq: Two times a day (BID) | NASAL | Status: DC
  Administered 2020-07-05 – 2020-07-13 (×10): 1 via NASAL
  Filled 2020-07-04 (×3): qty 15

## 2020-07-04 MED ORDER — LABETALOL HCL 100 MG PO TABS: 100.0000 mg | ORAL_TABLET | Freq: Three times a day (TID) | ORAL | Status: DC | PRN

## 2020-07-04 MED ORDER — BISACODYL 10 MG RE SUPP
10.0000 mg | Freq: Once | RECTAL | Status: AC
  Administered 2020-07-04: 10 mg via RECTAL
  Filled 2020-07-04: qty 1

## 2020-07-04 MED ORDER — LEVOTHYROXINE SODIUM 50 MCG PO TABS
50.0000 ug | ORAL_TABLET | Freq: Every day | ORAL | Status: DC
  Administered 2020-07-04 – 2020-07-12 (×9): 50 ug via ORAL
  Filled 2020-07-04 (×9): qty 1

## 2020-07-04 MED ORDER — AMIODARONE HCL 200 MG PO TABS
100.0000 mg | ORAL_TABLET | Freq: Every day | ORAL | Status: DC
  Administered 2020-07-04: 100 mg via ORAL
  Filled 2020-07-04: qty 1

## 2020-07-04 MED ORDER — ACETAMINOPHEN 500 MG PO TABS
500.0000 mg | ORAL_TABLET | Freq: Four times a day (QID) | ORAL | Status: DC | PRN
  Filled 2020-07-04: qty 1

## 2020-07-04 MED ORDER — SODIUM CHLORIDE 0.9 % IV SOLN
1.0000 g | Freq: Once | INTRAVENOUS | Status: AC
  Administered 2020-07-04: 1 g via INTRAVENOUS
  Filled 2020-07-04: qty 10

## 2020-07-04 MED ORDER — SODIUM CHLORIDE 0.9 % IV SOLN: INTRAVENOUS | Status: AC

## 2020-07-04 MED ORDER — APIXABAN 2.5 MG PO TABS
2.5000 mg | ORAL_TABLET | Freq: Two times a day (BID) | ORAL | Status: DC
  Administered 2020-07-04 – 2020-07-06 (×5): 2.5 mg via ORAL
  Filled 2020-07-04 (×6): qty 1

## 2020-07-04 MED ORDER — LIDOCAINE 4 % EX OINT: 1.0000 "application " | TOPICAL_OINTMENT | Freq: Three times a day (TID) | CUTANEOUS | Status: DC | PRN

## 2020-07-04 MED ORDER — POLYETHYLENE GLYCOL 3350 17 G PO PACK
17.0000 g | PACK | Freq: Every day | ORAL | Status: DC
  Administered 2020-07-04 – 2020-07-05 (×2): 17 g via ORAL
  Filled 2020-07-04 (×6): qty 1

## 2020-07-04 MED ORDER — IOHEXOL 300 MG/ML  SOLN
75.0000 mL | Freq: Once | INTRAMUSCULAR | Status: AC | PRN
  Administered 2020-07-04: 75 mL via INTRAVENOUS

## 2020-07-04 NOTE — ED Notes (Signed)
Pt brought back in room from x-ray...they are now being transported to CT.

## 2020-07-04 NOTE — H&P (Addendum)
History and Physical    [name] [name] DOB: November 15, 1932 DOA: 07/04/2020  PCP: [name], MD (Confirm with patient/family/NH records and if not entered, this has to be entered at [facility] point of entry) Patient coming from: Home  I have personally briefly reviewed patient's old medical records in [place]  Chief Complaint: Fall and pain  HPI: [name] is a 87 y.o. female with medical history significant of chronic back pain secondary to lumbar spine OA, chronic ambulation dysfunction, CAD status post CABG, chronic A. fib on Eliquis, HLD, aortic regurgitation and mitral regurgitation, presented with fall and question of near syncope.  Patient has been constipated lately after starting cane narcotics for her back pain.  And today, patient fell after she moved her bowels.  She cannot remember other she had a near syncope of lightheadedness palpitation or nauseous vomit.  She has had A. fib and recently cardiology switch her metoprolol to amiodarone due to bradycardia in January 2022.   Also, her back pain has been poorly controlled for last 3 to 21-month due to " bulging disc" and she has been following with surgeon and she was offered surgery 2 months ago but surgeon was concerned about her overall conditions so did not proceed.  For last 3 months or so, patient has had significant deterioration of her ambulation status, and she started to use walker to ambulate.  She says she feels extremely weak on bilateral lower extremities especially the right thighs and she also has chronic leg numbness. But she denied any claudication.  Patient denied any chest pain shortness of breath fever chills cough urinary problems  ED Course: CT head negative for bleeding or other acute findings.  CT lumbar spine showed L1 acute compression fracture.  Blood work showed AKI and worsening of transaminitis and AKI.  UA showed UTI.  Review of Systems: As per HPI otherwise 14 point review of  systems negative.    Past Medical History:  Diagnosis Date  . Allergy   . Anemia, pernicious   . Arthritis    bursitis   . Atrophic gastritis   . B12 deficiency   . CAD (coronary artery disease)   . Carotid artery stenosis   . Cataract   . Diarrhea    on and off for months  . Family history of adverse reaction to anesthesia    extreme nausea brother and sister  . Fibromyalgia   . Gallbladder polyp   . GERD (gastroesophageal reflux disease)   . Heart murmur   . History of hiatal hernia   . History of kidney stones   . HLD (hyperlipidemia)   . HTN (hypertension)   . Hypothyroidism   . Irritable bowel syndrome (IBS)   . Median arcuate ligament syndrome ([place])   . Osteoporosis   . PVD (peripheral vascular disease) ([place])   . Tubular adenoma of colon 08/2013   with focal high grade dysplasia    Past Surgical History:  Procedure Laterality Date  . ABDOMINAL HYSTERECTOMY    . APPENDECTOMY    . BIOPSY THYROID    . CARDIOVERSION N/A 04/03/2020   Procedure: CARDIOVERSION;  Surgeon: Dorothy Spark, MD;  Location: [facility] ENDOSCOPY;  Service: Cardiovascular;  Laterality: N/A;  . cataract surgery    . CHOLECYSTECTOMY N/A 10/24/2014   Procedure: MICROSCOPIC  CHOLECYSTECTOMY;  Surgeon: Ralene Ok, MD;  Location: [place];  Service: General;  Laterality: N/A;  . COLONOSCOPY    . CORONARY ARTERY BYPASS GRAFT  [place] N/A 08/02/2012   Procedure: ESOPHAGEAL MANOMETRY (EM);  Surgeon: Sable Feil, MD;  Location: [facility] ENDOSCOPY;  Service: Endoscopy;  Laterality: N/A;  . ESOPHAGOGASTRODUODENOSCOPY (EGD) WITH PROPOFOL N/A 04/07/2019   Procedure: ESOPHAGOGASTRODUODENOSCOPY (EGD) WITH PROPOFOL;  Surgeon: Milus Banister, MD;  Location: [facility] ENDOSCOPY;  Service: Endoscopy;  Laterality: N/A;  . HEMOSTASIS CLIP PLACEMENT  04/07/2019   Procedure: HEMOSTASIS CLIP PLACEMENT;  Surgeon: Milus Banister, MD;  Location: [facility] ENDOSCOPY;  Service: Endoscopy;;  . HOT HEMOSTASIS N/A  04/07/2019   Procedure: HOT HEMOSTASIS (ARGON PLASMA COAGULATION/BICAP);  Surgeon: Milus Banister, MD;  Location: [facility] ENDOSCOPY;  Service: Endoscopy;  Laterality: N/A;  . POLYPECTOMY  04/07/2019   Procedure: POLYPECTOMY;  Surgeon: Milus Banister, MD;  Location: [facility] ENDOSCOPY;  Service: Endoscopy;;  . [name]  04/07/2019   Procedure: [name];  Surgeon: Milus Banister, MD;  Location: [facility] ENDOSCOPY;  Service: Endoscopy;;  . TEE WITHOUT CARDIOVERSION N/A 04/03/2020   Procedure: TRANSESOPHAGEAL ECHOCARDIOGRAM (TEE);  Surgeon: Dorothy Spark, MD;  Location: [facility] ENDOSCOPY;  Service: Cardiovascular;  Laterality: N/A;  . THYROID LOBECTOMY    . UPPER GASTROINTESTINAL ENDOSCOPY       reports that she has never smoked. She has never used smokeless tobacco. She reports that she does not drink alcohol and does not use drugs.  Allergies  Allergen Reactions  . Actos [Pioglitazone] Swelling    Ankle swelling  . Sulfonamide Derivatives Swelling  . Adhesive [Tape] Other (See Comments)    WITH PROLONGED EXPOSURE (REDNESS/ITCHING)  . Codeine Nausea And Vomiting    Family History  Problem Relation Age of Onset  . Heart disease Mother   . Cholelithiasis Mother   . Prostate cancer Brother   . Colon cancer Brother   . Breast cancer Other        niece - breast cancer  . Diabetes Sister   . Lung cancer Father   . Lung cancer Sister   . Stomach cancer Cousin   . Esophageal cancer Neg Hx   . Rectal cancer Neg Hx     Prior to Admission medications   Medication Sig Start Date End Date Taking? Authorizing Provider  acetaminophen (TYLENOL) 500 MG tablet Take 500 mg by mouth as needed for mild pain.   Yes [provider]  amiodarone (PACERONE) 100 MG tablet Take 1 tablet (100 mg total) by mouth daily. 06/20/20 06/15/21 Yes Sherren Mocha, MD  atorvastatin (LIPITOR) 80 MG tablet Take 0.5 tablets (40 mg total) by mouth at bedtime. 05/29/20  Yes Burtis Junes, NP  Calcium  Carbonate-Vitamin D (CALTRATE 600+D PO) Take 1 tablet by mouth daily with lunch.    Yes [provider]  cyanocobalamin (,VITAMIN B-12,) 1000 MCG/ML injection Inject 1,000 mcg into the muscle every 30 (thirty) days.   Yes [provider]  ELIQUIS 2.5 MG TABS tablet TAKE 1 TABLET BY MOUTH TWICE DAILY Patient taking differently: Take 2.5 mg by mouth 2 (two) times daily. 06/28/20  Yes Burchette, Alinda Sierras, MD  gabapentin (NEURONTIN) 100 MG capsule Take 1 capsule (100 mg total) by mouth 2 (two) times daily. Patient taking differently: Take 100 mg by mouth as needed (pain). 01/24/20  Yes Burchette, Alinda Sierras, MD  hyoscyamine (LEVSIN SL) 0.125 MG SL tablet Place 1 tablet (0.125 mg total) under the tongue every 6 (six) hours as needed. Patient taking differently: Place 0.125 mg under the tongue as needed for cramping. 05/23/19  Yes Burchette, Alinda Sierras, MD  ipratropium (ATROVENT) 0.03 % nasal spray Place 2 sprays into both nostrils every 12 (twelve) hours.   Yes [provider]  lactase (LACTAID) 3000 units tablet Take 3,000 Units by mouth 3 (three) times daily with meals as needed (lactose Intolerance).    Yes [provider]  levothyroxine (SYNTHROID, LEVOTHROID) 50 MCG tablet Take 50 mcg by mouth at bedtime.   Yes [provider]  Lidocaine 4 % OINT Apply 1 application topically 3 (three) times daily as needed (pain). Roll-On (otc)   Yes [provider]  Lidocaine 4 % PTCH Place 1 patch onto the skin daily as needed (pain.).    Yes [provider]  losartan (COZAAR) 100 MG tablet Take 1 tablet (100 mg total) by mouth daily. 05/17/20  Yes Burchette, Alinda Sierras, MD  Multiple Vitamins-Minerals (PRESERVISION AREDS 2 PO) Take 1 tablet by mouth 2 (two) times daily.   Yes [provider]  Polyethyl Glycol-Propyl Glycol (SYSTANE FREE OP) Place 1 drop into both eyes 3 (three) times daily as needed (dry/irritated eyes.).   Yes [provider]   saccharomyces boulardii (FLORASTOR) 250 MG capsule Take 250 mg by mouth as needed (digestion).   Yes [provider]  sodium chloride (OCEAN) 0.65 % SOLN nasal spray Place 1 spray into both nostrils as needed for congestion.   Yes [provider]  spironolactone (ALDACTONE) 25 MG tablet TAKE 1 TABLET BY MOUTH ONCE DAILY Patient taking differently: Take 25 mg by mouth daily. 06/28/20  Yes Burchette, Alinda Sierras, MD  sucralfate (CARAFATE) 1 GM/10ML suspension TAKE 10 MLS (1 G TOTAL) BY MOUTH 2 (TWO) TIMES DAILY. Patient taking differently: Take 1 g by mouth as needed (reflux). 08/23/19  Yes Burchette, Alinda Sierras, MD  traMADol (ULTRAM) 50 MG tablet Take 50 mg by mouth as needed for moderate pain or severe pain.   Yes [provider]    Physical Exam: Vitals:   07/04/20 1100 07/04/20 1115 07/04/20 1130 07/04/20 1145  BP: (!) 138/56 (!) 129/50 (!) 139/50 (!) 127/57  Pulse: 83 81 74 82  Resp: (!) 26 (!) 22 17 20   Temp:      TempSrc:      SpO2: 94% 96% 100% 97%  Weight:      Height:        Constitutional: NAD, calm, comfortable Vitals:   07/04/20 1100 07/04/20 1115 07/04/20 1130 07/04/20 1145  BP: (!) 138/56 (!) 129/50 (!) 139/50 (!) 127/57  Pulse: 83 81 74 82  Resp: (!) 26 (!) 22 17 20   Temp:      TempSrc:      SpO2: 94% 96% 100% 97%  Weight:      Height:       Eyes: PERRL, lids and conjunctivae normal ENMT: Mucous membranes are dry. Posterior pharynx clear of any exudate or lesions.Normal dentition.  Neck: normal, supple, no masses, no thyromegaly Respiratory: clear to auscultation bilaterally, no wheezing, no crackles. Normal respiratory effort. No accessory muscle use.  Cardiovascular: Irregular heart rate, blowing systolic murmur on heart apex. No extremity edema. 2+ pedal pulses. No carotid bruits.  Abdomen: no tenderness, no masses palpated. No hepatosplenomegaly. Bowel sounds positive.  Musculoskeletal: no clubbing / cyanosis. No joint deformity upper and  lower extremities. Good ROM, no contractures. Normal muscle tone.  Skin: no rashes, lesions, ulcers. No induration Neurologic: CN 2-12 grossly intact. DTR normal. Strength 5/5 in all 4.  Decreased light touch sensation on bilateral lower extremities below knees Psychiatric: Normal judgment and  insight. Alert and oriented x 3. Normal mood.    Labs on Admission: I have personally reviewed following labs and imaging studies  CBC: Recent Labs  Lab 07/04/20 0627  WBC 17.6*  HGB 12.1  HCT 37.0  MCV 84.7  PLT 027   Basic Metabolic Panel: Recent Labs  Lab 07/04/20 0627  NA 130*  K 4.6  CL 98  CO2 21*  GLUCOSE 114*  BUN 47*  CREATININE 1.48*  CALCIUM 8.7*   GFR: Estimated Creatinine Clearance: 17.5 mL/min (A) (by C-G formula based on SCr of 1.48 mg/dL (H)). Liver Function Tests: Recent Labs  Lab 07/04/20 0756  AST 286*  ALT 279*  ALKPHOS 81  BILITOT 1.0  PROT 6.8  ALBUMIN 2.6*   No results for input(s): LIPASE, AMYLASE in the last 168 hours. No results for input(s): AMMONIA in the last 168 hours. Coagulation Profile: No results for input(s): INR, PROTIME in the last 168 hours. Cardiac Enzymes: No results for input(s): CKTOTAL, CKMB, CKMBINDEX, TROPONINI in the last 168 hours. BNP (last 3 results) No results for input(s): PROBNP in the last 8760 hours. HbA1C: No results for input(s): HGBA1C in the last 72 hours. CBG: Recent Labs  Lab 07/04/20 0800  GLUCAP 105*   Lipid Profile: No results for input(s): CHOL, HDL, LDLCALC, TRIG, CHOLHDL, LDLDIRECT in the last 72 hours. Thyroid Function Tests: No results for input(s): TSH, T4TOTAL, FREET4, T3FREE, THYROIDAB in the last 72 hours. Anemia Panel: No results for input(s): VITAMINB12, FOLATE, FERRITIN, TIBC, IRON, RETICCTPCT in the last 72 hours. Urine analysis:    Component Value Date/Time   COLORURINE YELLOW 07/04/2020 0622   APPEARANCEUR HAZY (A) 07/04/2020 0622   LABSPEC 1.014 07/04/2020 0622   PHURINE 5.0  07/04/2020 0622   GLUCOSEU NEGATIVE 07/04/2020 0622   GLUCOSEU NEGATIVE 06/22/2013 1619   HGBUR SMALL (A) 07/04/2020 0622   HGBUR negative 07/30/2009 1606   BILIRUBINUR NEGATIVE 07/04/2020 0622   BILIRUBINUR negative 12/05/2019 1535   KETONESUR NEGATIVE 07/04/2020 0622   PROTEINUR NEGATIVE 07/04/2020 0622   UROBILINOGEN 0.2 12/05/2019 1535   UROBILINOGEN 0.2 06/22/2013 1619   NITRITE NEGATIVE 07/04/2020 0622   LEUKOCYTESUR SMALL (A) 07/04/2020 0622    Radiological Exams on Admission: DG Chest 2 View  Result Date: 07/04/2020 CLINICAL DATA:  Syncope EXAM: CHEST - 2 VIEW COMPARISON:  April 01, 2018 FINDINGS: There is an equivocal pleural effusion on each side with mild bibasilar atelectasis. No edema or airspace opacity. Heart is mildly enlarged with pulmonary vascularity normal. Patient is status post coronary artery bypass grafting. No adenopathy. There are foci of degenerative change in the thoracic spine. IMPRESSION: Mild bibasilar atelectasis with equivocal pleural effusion on each side. No airspace opacity or edema evident. Heart mildly enlarged. Status post coronary artery bypass grafting. Electronically Signed   By: Lowella Grip III M.D.   On: 07/04/2020 08:57   DG Ankle Complete Left  Result Date: 07/04/2020 CLINICAL DATA:  Fall, left ankle pain EXAM: LEFT ANKLE COMPLETE - 3+ VIEW COMPARISON:  None. FINDINGS: Three view radiograph left ankle demonstrates normal alignment. No acute fracture or dislocation. Ankle mortise is intact. No ankle effusion. Small plantar calcaneal spur. Mild bimalleolar soft tissue swelling. Diffuse subcutaneous edema noted involving the visualized left foreleg and left forefoot. IMPRESSION: Soft tissue swelling.  No fracture or dislocation. Electronically Signed   By: Fidela Salisbury MD   On: 07/04/2020 06:48   CT HEAD WO CONTRAST  Result Date: 07/04/2020 CLINICAL DATA:  87 year old female with dizziness.  Fall at home. EXAM: CT HEAD WITHOUT CONTRAST  TECHNIQUE: Contiguous axial images were obtained from the base of the skull through the vertex without intravenous contrast. COMPARISON:  [place]  Head CT 07/20/2019. FINDINGS: Brain: Stable cerebral volume from last year, generalized volume loss. No midline shift, ventriculomegaly, mass effect, evidence of mass lesion, intracranial hemorrhage or evidence of cortically based acute infarction. Gray-white matter differentiation is within normal limits throughout the brain. Dystrophic basal ganglia calcifications. No focal encephalomalacia identified. Vascular: Calcified atherosclerosis at the skull base. No suspicious intracranial vascular hyperdensity. Skull: Stable, intact. Sinuses/Orbits: Chronic left frontal sinus opacification with small osteoma along the posterior left frontoethmoidal recess, unchanged. Other Visualized paranasal sinuses and mastoids are clear. Other: No orbit or scalp soft tissue injury identified. IMPRESSION: 1. No acute intracranial abnormality or acute traumatic injury identified. Stable generalized cerebral volume loss. 2. Chronic left frontal sinus disease. Electronically Signed   By: Genevie Ann M.D.   On: 07/04/2020 07:19   CT ABDOMEN PELVIS W CONTRAST  Result Date: 07/04/2020 CLINICAL DATA:  syncopal episode this morning and fell in the bathroom. Now having right sided abdominal tenderness EXAM: CT ABDOMEN AND PELVIS WITH CONTRAST TECHNIQUE: Multidetector CT imaging of the abdomen and pelvis was performed using the standard protocol following bolus administration of intravenous contrast. CONTRAST:  64mL OMNIPAQUE IOHEXOL 300 MG/ML  SOLN COMPARISON:  CT abdomen and pelvis January 25, 2019. FINDINGS: Lower chest: Bibasilar atelectasis. Thoracic aortic atherosclerosis. Small hiatal hernia. Hepatobiliary: No suspicious hepatic lesion. Gallbladder surgically absent. Similar prominence of the biliary tree, likely reservoir effect post cholecystectomy. Pancreas: Mild dorsal duct  distension which is stable in comparison to prior studies. No focal pancreatic lesion. Spleen: No splenic injury or perisplenic hematoma. Adrenals/Urinary Tract: Bilateral adrenal glands are unremarkable. No hydronephrosis. Mild urothelial thickening at left-sided extrarenal pelvis. Nonobstructive 4 mm left lower pole renal stone. Bilateral low-density renal lesions are unchanged in comparison to prior and likely cysts. Urinary bladder is moderately distended without suspicious wall thickening. Stomach/Bowel: Stomach is predominantly decompressed limiting evaluation. No suspicious small bowel wall thickening or dilation. The appendix is not definitely visualized however there is no inflammatory stranding along the cecum to suggest acute inflammation. Moderate volume of formed stool throughout the colon. Sigmoid colonic diverticulosis without findings of acute diverticulitis. Vascular/Lymphatic: Aortic atherosclerosis. No enlarged abdominal or pelvic lymph nodes. Reproductive: Status post hysterectomy. No adnexal masses. Other: No abdominopelvic ascites. Musculoskeletal: New anterior compression deformity of the L1 vertebral body with approximately 50% height loss and mild retropulsion of bone and disc into the spinal canal with narrowing of the canal. Global demineralization of bone. Multilevel degenerative changes spine. Degenerative changes bilateral hips. IMPRESSION: 1. New anterior compression deformity of the L1 vertebral body with approximately 50% height loss and mild retropulsion of bone and disc into the spinal canal with moderate narrowing of the canal at this level. 2. Apparent mild urothelial thickening of a left-sided extrarenal pelvis may reflect ascending urinary tract infection. Correlate with urinalysis. 3. Nonobstructive left nephrolithiasis. 4. Sigmoid colonic diverticulosis without findings of acute diverticulitis. 5. Moderate volume of formed stool throughout the colon. 6. Aortic  atherosclerosis.  Aortic Atherosclerosis (ICD10-I70.0). Electronically Signed   By: Dahlia Bailiff MD   On: 07/04/2020 10:04    EKG: Independently reviewed.  A. fib, chronic LBBB  Assessment/Plan Active Problems:   Fall at home, initial encounter   Fall  (please populate well all problems here in Problem List. (For example, if patient is on BP meds at  home and you resume or decide to hold them, it is a problem that needs to be her. Same for CAD, COPD, HLD and so on)  Fall and question of near syncope  -Probably vasovagal -No significant arrhythmia/bradycardia in ED.  Will put patient on 24-hour telemetry monitoring to rule out uncontrolled A. fib versus bradycardia given her history of metoprolol induced bradycardia several months ago. -Check orthostatic vital signs x2 -Echo was done 3 months ago, so will not repeat this time.  Acute L1 compression fracture -No significant focal neuro deficit -MRI ordered by neurosurgery and will follow. -Aad as needed Dilaudid in addition to her home pain regimen. Limit use of Tylenol with a total dose less than 3 g daily.  Worsening of chronic transaminases -Her PCP has been trending LFTs lately, and there was a concern about amiodarone toxicity. -We will hold statin for now. -Limit Tylenol use <3 gm daily -Send CK to rule out myositis. -Check acetaminophen level, but overdose suspicion is low.  Chronic A. Fib -Continue amiodarone -Continue Eliquis  AKI -Appears to be hypovolemic -IV hydration x24 hours -Hold ARB and Aldactone  UTI -Ceftriaxone  History of atrophic gastritis and B12 deficiency -Suspect peripheral neuropathy, check B12 level. May need outpt B12 injections.  DVT prophylaxis: Eliquis  code Status: Full Code Family Communication: Husband at bedside Disposition Plan: Expect more than 2 midnight hospital stay to treat multiple conditions of ambulation dysfunction, AKI, L1 compression fracture and for PT evaluation.    Consults called: Neurosurgery Admission status: Tele admit   Lequita Halt MD [facility] Pager [phone]  07/04/2020, 1:09 PM

## 2020-07-04 NOTE — Consult Note (Signed)
Providing Compassionate, Quality Care - Together    Reason for Consult: L1 burst fracture Referring Physician: [name] is an 87 y.o. female.  HPI: Patient with a significant PMH outlined below. She also reports a history of atrial fibrillation, which is not listed on her medical history. She presented to the [facility] emergency department early this morning following a fall in her bathroom. She has a history of back pain and was last seen in the office by Dr. Annette Stable. At that time, he recommended decompressive surgery at L4-5, but the patient was unable to schedule. Prior to her fall, patient was experiencing numbness and weakness into her RLE. At present, she reports significant back pain. She tells me she has had issues with urinary retention and bowel incontinence over the last month. She has been wearing an adult incontinence brief. She denies saddle paresthesia, but does have numbness into bilateral lower extremities. Her CT scan revealed a new anterior compression deformity of the L1 vertebral body, with approximately 50% height loss and mild retropulsion of bone and disc into the spinal canal with moderate narrowing of the canal at this level. Her head CT was negative. Neurosurgery was consulted for further evaluation and recommendations.  Past Medical History:  Diagnosis Date  . Allergy   . Anemia, pernicious   . Arthritis    bursitis   . Atrophic gastritis   . B12 deficiency   . CAD (coronary artery disease)   . Carotid artery stenosis   . Cataract   . Diarrhea    on and off for months  . Family history of adverse reaction to anesthesia    extreme nausea brother and sister  . Fibromyalgia   . Gallbladder polyp   . GERD (gastroesophageal reflux disease)   . Heart murmur   . History of hiatal hernia   . History of kidney stones   . HLD (hyperlipidemia)   . HTN (hypertension)   . Hypothyroidism   . Irritable bowel syndrome (IBS)   . Median arcuate  ligament syndrome ([place])   . Osteoporosis   . PVD (peripheral vascular disease) ([place])   . Tubular adenoma of colon 08/2013   with focal high grade dysplasia    Past Surgical History:  Procedure Laterality Date  . ABDOMINAL HYSTERECTOMY    . APPENDECTOMY    . BIOPSY THYROID    . CARDIOVERSION N/A 04/03/2020   Procedure: CARDIOVERSION;  Surgeon: Dorothy Spark, MD;  Location: [facility] ENDOSCOPY;  Service: Cardiovascular;  Laterality: N/A;  . cataract surgery    . CHOLECYSTECTOMY N/A 10/24/2014   Procedure: MICROSCOPIC  CHOLECYSTECTOMY;  Surgeon: Ralene Ok, MD;  Location: [facility];  Service: General;  Laterality: N/A;  . COLONOSCOPY    . CORONARY ARTERY BYPASS GRAFT  07  . ESOPHAGEAL MANOMETRY N/A 08/02/2012   Procedure: ESOPHAGEAL MANOMETRY (EM);  Surgeon: Sable Feil, MD;  Location: [facility] ENDOSCOPY;  Service: Endoscopy;  Laterality: N/A;  . ESOPHAGOGASTRODUODENOSCOPY (EGD) WITH PROPOFOL N/A 04/07/2019   Procedure: ESOPHAGOGASTRODUODENOSCOPY (EGD) WITH PROPOFOL;  Surgeon: Milus Banister, MD;  Location: [facility] ENDOSCOPY;  Service: Endoscopy;  Laterality: N/A;  . HEMOSTASIS CLIP PLACEMENT  04/07/2019   Procedure: HEMOSTASIS CLIP PLACEMENT;  Surgeon: Milus Banister, MD;  Location: [facility] ENDOSCOPY;  Service: Endoscopy;;  . HOT HEMOSTASIS N/A 04/07/2019   Procedure: HOT HEMOSTASIS (ARGON PLASMA COAGULATION/BICAP);  Surgeon: Milus Banister, MD;  Location: [facility] END[facility];  Service: Endoscopy;  Laterality: N/A;  . POLYPECTOMY  04/07/2019  Procedure: POLYPECTOMY;  Surgeon: Milus Banister, MD;  Location: [facility] ENDOSCOPY;  Service: Endoscopy;;  . [name]  04/07/2019   Procedure: [name];  Surgeon: Milus Banister, MD;  Location: [facility] ENDOSCOPY;  Service: Endoscopy;;  . TEE WITHOUT CARDIOVERSION N/A 04/03/2020   Procedure: TRANSESOPHAGEAL ECHOCARDIOGRAM (TEE);  Surgeon: Dorothy Spark, MD;  Location: [facility] ENDOSCOPY;  Service: Cardiovascular;  Laterality: N/A;  . THYROID LOBECTOMY    .  UPPER GASTROINTESTINAL ENDOSCOPY      Family History  Problem Relation Age of Onset  . Heart disease Mother   . Cholelithiasis Mother   . Prostate cancer Brother   . Colon cancer Brother   . Breast cancer Other        niece - breast cancer  . Diabetes Sister   . Lung cancer Father   . Lung cancer Sister   . Stomach cancer Cousin   . Esophageal cancer Neg Hx   . Rectal cancer Neg Hx     Social History:  reports that she has never smoked. She has never used smokeless tobacco. She reports that she does not drink alcohol and does not use drugs.  Allergies:  Allergies  Allergen Reactions  . Actos [Pioglitazone] Swelling    Ankle swelling  . Sulfonamide Derivatives Swelling  . Adhesive [Tape] Other (See Comments)    WITH PROLONGED EXPOSURE (REDNESS/ITCHING)  . Codeine Nausea And Vomiting    Medications: I have reviewed the patient's current medications.  Results for orders placed or performed during the hospital encounter of 07/04/20 (from the past 48 hour(s))  Urinalysis, Routine w reflex microscopic     Status: Abnormal   Collection Time: 07/04/20  6:22 AM  Result Value Ref Range   Color, Urine YELLOW YELLOW   APPearance HAZY (A) CLEAR   Specific Gravity, Urine 1.014 1.005 - 1.030   pH 5.0 5.0 - 8.0   Glucose, UA NEGATIVE NEGATIVE mg/dL   Hgb urine dipstick SMALL (A) NEGATIVE   Bilirubin Urine NEGATIVE NEGATIVE   Ketones, ur NEGATIVE NEGATIVE mg/dL   Protein, ur NEGATIVE NEGATIVE mg/dL   Nitrite NEGATIVE NEGATIVE   Leukocytes,Ua SMALL (A) NEGATIVE   RBC / HPF 0-5 0 - 5 RBC/hpf   WBC, UA 11-20 0 - 5 WBC/hpf   Bacteria, UA MANY (A) NONE SEEN   Squamous Epithelial / LPF 0-5 0 - 5   Mucus PRESENT     Comment: Performed at [facility], [address] [place], [place] [address]  Basic metabolic panel     Status: Abnormal   Collection Time: 07/04/20  6:27 AM  Result Value Ref Range   Sodium 130 (L) 135 - 145 mmol/L   Potassium 4.6 3.5 - 5.1 mmol/L   Chloride  98 98 - 111 mmol/L   CO2 21 (L) 22 - 32 mmol/L   Glucose, Bld 114 (H) 70 - 99 mg/dL    Comment: Glucose reference range applies only to samples taken after fasting for at least 8 hours.   BUN 47 (H) 8 - 23 mg/dL   Creatinine, Ser 1.48 (H) 0.44 - 1.00 mg/dL   Calcium 8.7 (L) 8.9 - 10.3 mg/dL   GFR, Estimated 34 (L) >60 mL/min    Comment: (NOTE) Calculated using the CKD-EPI Creatinine Equation (2021)    Anion gap 11 5 - 15    Comment: Performed at [facility] [address], [place], [place] [address]  CBC     Status: Abnormal   Collection Time:  07/04/20  6:27 AM  Result Value Ref Range   WBC 17.6 (H) 4.0 - 10.5 K/uL   RBC 4.37 3.87 - 5.11 MIL/uL   Hemoglobin 12.1 12.0 - 15.0 g/dL   HCT 37.0 36.0 - 46.0 %   MCV 84.7 80.0 - 100.0 fL   MCH 27.7 26.0 - 34.0 pg   MCHC 32.7 30.0 - 36.0 g/dL   RDW 15.6 (H) 11.5 - 15.5 %   Platelets 223 150 - 400 K/uL   nRBC 0.0 0.0 - 0.2 %    Comment: Performed at [facility] [address] [place], [place] [address]  Troponin I (High Sensitivity)     Status: Abnormal   Collection Time: 07/04/20  7:56 AM  Result Value Ref Range   Troponin I (High Sensitivity) 21 (H) <18 ng/L    Comment: (NOTE) Elevated high sensitivity troponin I (hsTnI) values and significant  changes across serial measurements may suggest ACS but many other  chronic and acute conditions are known to elevate hsTnI results.  Refer to the "Links" section for chest pain algorithms and additional  guidance. Performed at [facility], [place] [address], [place], [place] [address]   Hepatic function panel     Status: Abnormal   Collection Time: 07/04/20  7:56 AM  Result Value Ref Range   Total Protein 6.8 6.5 - 8.1 g/dL   Albumin 2.6 (L) 3.5 - 5.0 g/dL   AST 286 (H) 15 - 41 U/L   ALT 279 (H) 0 - 44 U/L   Alkaline Phosphatase 81 38 - 126 U/L   Total Bilirubin 1.0 0.3 - 1.2 mg/dL   Bilirubin, Direct 0.4 (H) 0.0 - 0.2 mg/dL   Indirect Bilirubin 0.6 0.3 - 0.9  mg/dL    Comment: Performed at [facility] [address], [place], [place] [address]  CBG monitoring, ED     Status: Abnormal   Collection Time: 07/04/20  8:00 AM  Result Value Ref Range   Glucose-Capillary 105 (H) 70 - 99 mg/dL    Comment: Glucose reference range applies only to samples taken after fasting for at least 8 hours.  Troponin I (High Sensitivity)     Status: Abnormal   Collection Time: 07/04/20 10:14 AM  Result Value Ref Range   Troponin I (High Sensitivity) 24 (H) <18 ng/L    Comment: (NOTE) Elevated high sensitivity troponin I (hsTnI) values and significant  changes across serial measurements may suggest ACS but many other  chronic and acute conditions are known to elevate hsTnI results.  Refer to the "Links" section for chest pain algorithms and additional  guidance. Performed at [facility], [place] [address], [place], [place] [address]     DG Chest 2 View  Result Date: 07/04/2020 CLINICAL DATA:  Syncope EXAM: CHEST - 2 VIEW COMPARISON:  April 01, 2018 FINDINGS: There is an equivocal pleural effusion on each side with mild bibasilar atelectasis. No edema or airspace opacity. Heart is mildly enlarged with pulmonary vascularity normal. Patient is status post coronary artery bypass grafting. No adenopathy. There are foci of degenerative change in the thoracic spine. IMPRESSION: Mild bibasilar atelectasis with equivocal pleural effusion on each side. No airspace opacity or edema evident. Heart mildly enlarged. Status post coronary artery bypass grafting. Electronically Signed   By: Lowella Grip III M.D.   On: 07/04/2020 08:57   DG Ankle Complete Left  Result Date: 07/04/2020 CLINICAL DATA:  Fall, left ankle pain EXAM: LEFT  ANKLE COMPLETE - 3+ VIEW COMPARISON:  None. FINDINGS: Three view radiograph left ankle demonstrates normal alignment. No acute fracture or dislocation. Ankle mortise is intact. No ankle effusion. Small plantar calcaneal spur. Mild  bimalleolar soft tissue swelling. Diffuse subcutaneous edema noted involving the visualized left foreleg and left forefoot. IMPRESSION: Soft tissue swelling.  No fracture or dislocation. Electronically Signed   By: Fidela Salisbury MD   On: 07/04/2020 06:48   CT HEAD WO CONTRAST  Result Date: 07/04/2020 CLINICAL DATA:  87 year old female with dizziness.  Fall at home. EXAM: CT HEAD WITHOUT CONTRAST TECHNIQUE: Contiguous axial images were obtained from the base of the skull through the vertex without intravenous contrast. COMPARISON:  [facility]  Head CT 07/20/2019. FINDINGS: Brain: Stable cerebral volume from last year, generalized volume loss. No midline shift, ventriculomegaly, mass effect, evidence of mass lesion, intracranial hemorrhage or evidence of cortically based acute infarction. Gray-white matter differentiation is within normal limits throughout the brain. Dystrophic basal ganglia calcifications. No focal encephalomalacia identified. Vascular: Calcified atherosclerosis at the skull base. No suspicious intracranial vascular hyperdensity. Skull: Stable, intact. Sinuses/Orbits: Chronic left frontal sinus opacification with small osteoma along the posterior left frontoethmoidal recess, unchanged. Other Visualized paranasal sinuses and mastoids are clear. Other: No orbit or scalp soft tissue injury identified. IMPRESSION: 1. No acute intracranial abnormality or acute traumatic injury identified. Stable generalized cerebral volume loss. 2. Chronic left frontal sinus disease. Electronically Signed   By: Genevie Ann M.D.   On: 07/04/2020 07:19   CT ABDOMEN PELVIS W CONTRAST  Result Date: 07/04/2020 CLINICAL DATA:  syncopal episode this morning and fell in the bathroom. Now having right sided abdominal tenderness EXAM: CT ABDOMEN AND PELVIS WITH CONTRAST TECHNIQUE: Multidetector CT imaging of the abdomen and pelvis was performed using the standard protocol following bolus administration of intravenous  contrast. CONTRAST:  52mL OMNIPAQUE IOHEXOL 300 MG/ML  SOLN COMPARISON:  CT abdomen and pelvis January 25, 2019. FINDINGS: Lower chest: Bibasilar atelectasis. Thoracic aortic atherosclerosis. Small hiatal hernia. Hepatobiliary: No suspicious hepatic lesion. Gallbladder surgically absent. Similar prominence of the biliary tree, likely reservoir effect post cholecystectomy. Pancreas: Mild dorsal duct distension which is stable in comparison to prior studies. No focal pancreatic lesion. Spleen: No splenic injury or perisplenic hematoma. Adrenals/Urinary Tract: Bilateral adrenal glands are unremarkable. No hydronephrosis. Mild urothelial thickening at left-sided extrarenal pelvis. Nonobstructive 4 mm left lower pole renal stone. Bilateral low-density renal lesions are unchanged in comparison to prior and likely cysts. Urinary bladder is moderately distended without suspicious wall thickening. Stomach/Bowel: Stomach is predominantly decompressed limiting evaluation. No suspicious small bowel wall thickening or dilation. The appendix is not definitely visualized however there is no inflammatory stranding along the cecum to suggest acute inflammation. Moderate volume of formed stool throughout the colon. Sigmoid colonic diverticulosis without findings of acute diverticulitis. Vascular/Lymphatic: Aortic atherosclerosis. No enlarged abdominal or pelvic lymph nodes. Reproductive: Status post hysterectomy. No adnexal masses. Other: No abdominopelvic ascites. Musculoskeletal: New anterior compression deformity of the L1 vertebral body with approximately 50% height loss and mild retropulsion of bone and disc into the spinal canal with narrowing of the canal. Global demineralization of bone. Multilevel degenerative changes spine. Degenerative changes bilateral hips. IMPRESSION: 1. New anterior compression deformity of the L1 vertebral body with approximately 50% height loss and mild retropulsion of bone and disc into the spinal  canal with moderate narrowing of the canal at this level. 2. Apparent mild urothelial thickening of a left-sided extrarenal pelvis may reflect ascending urinary tract  infection. Correlate with urinalysis. 3. Nonobstructive left nephrolithiasis. 4. Sigmoid colonic diverticulosis without findings of acute diverticulitis. 5. Moderate volume of formed stool throughout the colon. 6. Aortic atherosclerosis.  Aortic Atherosclerosis (ICD10-I70.0). Electronically Signed   By: Dahlia Bailiff MD   On: 07/04/2020 10:04    Review of Systems  Constitutional: Negative.   HENT: Negative.   Eyes: Negative.   Respiratory: Negative.   Cardiovascular: Negative.   Gastrointestinal: Positive for abdominal distention.       Incontinence of stool  Endocrine: Negative.   Genitourinary: Positive for difficulty urinating and flank pain. Negative for dysuria.  Musculoskeletal: Positive for arthralgias, back pain, gait problem and myalgias. Negative for neck pain and neck stiffness.  Allergic/Immunologic: Negative.   Neurological: Positive for weakness and numbness. Negative for dizziness, tremors, seizures, syncope, speech difficulty, light-headedness and headaches.  Hematological: Negative.   Psychiatric/Behavioral: Negative.    Blood pressure (!) 127/57, pulse 82, temperature 97.7 F (36.5 C), temperature source Oral, resp. rate 20, height 5\' 4"  (1.626 m), weight 40 kg, SpO2 97 %. Physical Exam Constitutional:      Appearance: She is cachectic.  HENT:     Head: Normocephalic and atraumatic.     Mouth/Throat:     Mouth: Mucous membranes are moist.     Pharynx: Oropharynx is clear.  Eyes:     Extraocular Movements: Extraocular movements intact.     Conjunctiva/sclera: Conjunctivae normal.     Pupils: Pupils are equal, round, and reactive to light.  Cardiovascular:     Rate and Rhythm: Normal rate. Rhythm irregular.  Pulmonary:     Effort: Pulmonary effort is normal. No respiratory distress.  Abdominal:      General: There is distension.  Genitourinary:    General: Normal vulva.  Musculoskeletal:        General: Tenderness present.     Cervical back: Normal range of motion and neck supple.     Comments: RLE limited exam due to pain level LLE strength intact  Skin:    General: Skin is warm and dry.     Capillary Refill: Capillary refill takes less than 2 seconds.     Comments: Reddened at sacrum and surrounding skin Appears to have a deep tissue injury at her sacrum on the right.  Neurological:     Mental Status: She is alert.     Cranial Nerves: Cranial nerves are intact.     Sensory: Sensory deficit present.  Psychiatric:        Mood and Affect: Mood normal.        Behavior: Behavior is cooperative.     Assessment/Plan: Patient fell early in the morning of 07/04/2020. She sustained an L1 burst fracture with retropulsion into the spinal canal. She has a history of spinal stenosis prior to her fall. Will obtain MRI to assess for neural impingement. Foley catheter placed. Medicine will admit. TLSO brace already ordered. Recommend donning brace when OOB. Neurosurgery will continue to follow.  [name] 07/04/2020, 12:49 PM

## 2020-07-04 NOTE — ED Provider Notes (Signed)
[facility] EMERGENCY DEPARTMENT Provider Note   CSN: [id number] Arrival date & time: 07/04/20  0973     History Chief Complaint  Patient presents with  . Loss of Consciousness  . Ankle Pain    [name] is a 87 y.o. female.  Presented to ER with concern for syncope.  Patient reports that while she was in the bathroom she felt very lightheaded and weak in her legs and went to the ground.  Does not think she had full loss of consciousness.  Had difficulty getting up afterwards.  Unsure if she hit her head.  States that she has chronic low back pain, this is worse than normal.  No fevers, cough, vomiting.  No chest pain or difficulty breathing.  Has been feeling generally fatigued and weak over the past few days to couple weeks.  Poor appetite.  HPI     Past Medical History:  Diagnosis Date  . Allergy   . Anemia, pernicious   . Arthritis    bursitis   . Atrophic gastritis   . B12 deficiency   . CAD (coronary artery disease)   . Carotid artery stenosis   . Cataract   . Diarrhea    on and off for months  . Family history of adverse reaction to anesthesia    extreme nausea brother and sister  . Fibromyalgia   . Gallbladder polyp   . GERD (gastroesophageal reflux disease)   . Heart murmur   . History of hiatal hernia   . History of kidney stones   . HLD (hyperlipidemia)   . HTN (hypertension)   . Hypothyroidism   . Irritable bowel syndrome (IBS)   . Median arcuate ligament syndrome ([place])   . Osteoporosis   . PVD (peripheral vascular disease) ([place])   . Tubular adenoma of colon 08/2013   with focal high grade dysplasia    Patient Active Problem List   Diagnosis Date Noted  . Fall at home, initial encounter 07/04/2020  . Fall 07/04/2020  . AKI (acute kidney injury) ([place])   . Transaminitis   . Allergic rhinitis due to pollen 06/08/2020  . Occult blood in stools 06/08/2020  . Gastro-esophageal reflux disease without esophagitis 06/08/2020  .  Vasomotor rhinitis 06/08/2020  . Wheezing 06/08/2020  . Diarrhea 10/27/2019  . Gastric polyp   . Intervertebral disc rupture 12/27/2018  . Lymphocytic colitis 09/15/2018  . B12 deficiency 06/29/2018  . Persistent atrial fibrillation ([place]) 04/05/2018  . Loose stools 07/06/2017  . Rectal bleeding 07/06/2017  . Generalized abdominal pain 07/06/2017  . Cervical spondylosis 03/11/2017  . Loss of weight 11/18/2013  . Nausea alone 11/18/2013  . Abdominal pain, epigastric 11/18/2013  . Anemia, iron deficiency 01/27/2013  . Temporomandibular joint-pain-dysfunction syndrome (TMJ) 06/28/2012  . Other abnormal glucose 08/25/2011  . Allergic rhinitis, cause unspecified 08/25/2011  . Right-sided chest wall pain 08/25/2011  . Murmur 09/20/2010  . Incisional hernia 09/20/2010  . HERNIATED DISC 01/02/2010  . CAROTID ARTERY DISEASE 08/27/2009  . Osteoporosis 06/07/2009  . BRADYCARDIA 02/05/2009  . BENIGN POSITIONAL VERTIGO 09/27/2008  . CELIAC ARTERY COMPRESSION SYNDROME 10/25/2007  . Hypothyroidism 01/08/2007  . Hyperlipidemia 01/08/2007  . Essential hypertension 01/08/2007  . Coronary atherosclerosis 01/08/2007  . PERIPHERAL VASCULAR DISEASE 01/08/2007  . ATROPHIC GASTRITIS 01/08/2007  . ANEMIA, PERNICIOUS, HX OF 01/08/2007  . RENAL CALCULUS, HX OF 01/08/2007  . HYSTERECTOMY, HX OF 01/08/2007  . APPENDECTOMY, HX OF 01/08/2007    Past Surgical History:  Procedure  Laterality Date  . ABDOMINAL HYSTERECTOMY    . APPENDECTOMY    . BIOPSY THYROID    . CARDIOVERSION N/A 04/03/2020   Procedure: CARDIOVERSION;  Surgeon: Dorothy Spark, MD;  Location[facility] ENDOSCOPY;  Service: Cardiovascular;  Laterality: N/A;  . cataract surgery    . CHOLECYSTECTOMY N/A 10/24/2014   Procedure: MICROSCOPIC  CHOLECYSTECTOMY;  Surgeon: Ralene Ok, MD;  Location: [place];  Service: General;  Laterality: N/A;  . COLONOSCOPY    . CORONARY ARTERY BYPASS GRAFT  07  . ESOPHAGEAL MANOMETRY N/A 08/02/2012    Procedure: ESOPHAGEAL MANOMETRY (EM);  Surgeon: Sable Feil, MD;  Location: [facility] ENDOSCOPY;  Service: Endoscopy;  Laterality: N/A;  . ESOPHAGOGASTRODUODENOSCOPY (EGD) WITH PROPOFOL N/A 04/07/2019   Procedure: ESOPHAGOGASTRODUODENOSCOPY (EGD) WITH PROPOFOL;  Surgeon: Milus Banister, MD;  Location: [facility] ENDOSCOPY;  Service: Endoscopy;  Laterality: N/A;  . HEMOSTASIS CLIP PLACEMENT  04/07/2019   Procedure: HEMOSTASIS CLIP PLACEMENT;  Surgeon: Milus Banister, MD;  Location: [facility] ENDOSCOPY;  Service: Endoscopy;;  . HOT HEMOSTASIS N/A 04/07/2019   Procedure: HOT HEMOSTASIS (ARGON PLASMA COAGULATION/BICAP);  Surgeon: Milus Banister, MD;  Location: [facility] ENDOSCOPY;  Service: Endoscopy;  Laterality: N/A;  . POLYPECTOMY  04/07/2019   Procedure: POLYPECTOMY;  Surgeon: Milus Banister, MD;  Location: [facility] ENDOSCOPY;  Service: Endoscopy;;  . [name]  04/07/2019   Procedure: [name];  Surgeon: Milus Banister, MD;  Location: [facility] ENDOSCOPY;  Service: Endoscopy;;  . TEE WITHOUT CARDIOVERSION N/A 04/03/2020   Procedure: TRANSESOPHAGEAL ECHOCARDIOGRAM (TEE);  Surgeon: Dorothy Spark, MD;  Location: [facility] ENDOSCOPY;  Service: Cardiovascular;  Laterality: N/A;  . THYROID LOBECTOMY    . UPPER GASTROINTESTINAL ENDOSCOPY       OB History   No obstetric history on file.     Family History  Problem Relation Age of Onset  . Heart disease Mother   . Cholelithiasis Mother   . Prostate cancer Brother   . Colon cancer Brother   . Breast cancer Other        niece - breast cancer  . Diabetes Sister   . Lung cancer Father   . Lung cancer Sister   . Stomach cancer Cousin   . Esophageal cancer Neg Hx   . Rectal cancer Neg Hx     Social History   Tobacco Use  . Smoking status: Never Smoker  . Smokeless tobacco: Never Used  Vaping Use  . Vaping Use: Never used  Substance Use Topics  . Alcohol use: No    Alcohol/week: 0.0 standard drinks  . Drug use: No    Home Medications Prior to  Admission medications   Medication Sig Start Date End Date Taking? Authorizing Provider  acetaminophen (TYLENOL) 500 MG tablet Take 500 mg by mouth as needed for mild pain.   Yes [provider]  amiodarone (PACERONE) 100 MG tablet Take 1 tablet (100 mg total) by mouth daily. 06/20/20 06/15/21 Yes Sherren Mocha, MD  atorvastatin (LIPITOR) 80 MG tablet Take 0.5 tablets (40 mg total) by mouth at bedtime. 05/29/20  Yes Burtis Junes, NP  Calcium Carbonate-Vitamin D (CALTRATE 600+D PO) Take 1 tablet by mouth daily with lunch.    Yes [provider]  cyanocobalamin (,VITAMIN B-12,) 1000 MCG/ML injection Inject 1,000 mcg into the muscle every 30 (thirty) days.   Yes [provider]  ELIQUIS 2.5 MG TABS tablet TAKE 1 TABLET BY MOUTH TWICE DAILY Patient taking differently: Take 2.5 mg by mouth 2 (two) times daily.  06/28/20  Yes Burchette, Alinda Sierras, MD  gabapentin (NEURONTIN) 100 MG capsule Take 1 capsule (100 mg total) by mouth 2 (two) times daily. Patient taking differently: Take 100 mg by mouth as needed (pain). 01/24/20  Yes Burchette, Alinda Sierras, MD  hyoscyamine (LEVSIN SL) 0.125 MG SL tablet Place 1 tablet (0.125 mg total) under the tongue every 6 (six) hours as needed. Patient taking differently: Place 0.125 mg under the tongue as needed for cramping. 05/23/19  Yes Burchette, Alinda Sierras, MD  ipratropium (ATROVENT) 0.03 % nasal spray Place 2 sprays into both nostrils every 12 (twelve) hours.   Yes [provider]  lactase (LACTAID) 3000 units tablet Take 3,000 Units by mouth 3 (three) times daily with meals as needed (lactose Intolerance).    Yes [provider]  levothyroxine (SYNTHROID, LEVOTHROID) 50 MCG tablet Take 50 mcg by mouth at bedtime.   Yes [provider]  Lidocaine 4 % OINT Apply 1 application topically 3 (three) times daily as needed (pain). Roll-On (otc)   Yes [provider]  Lidocaine 4 % PTCH Place 1 patch onto the skin daily as  needed (pain.).    Yes [provider]  losartan (COZAAR) 100 MG tablet Take 1 tablet (100 mg total) by mouth daily. 05/17/20  Yes Burchette, Alinda Sierras, MD  Multiple Vitamins-Minerals (PRESERVISION AREDS 2 PO) Take 1 tablet by mouth 2 (two) times daily.   Yes [provider]  Polyethyl Glycol-Propyl Glycol (SYSTANE FREE OP) Place 1 drop into both eyes 3 (three) times daily as needed (dry/irritated eyes.).   Yes [provider]  saccharomyces boulardii (FLORASTOR) 250 MG capsule Take 250 mg by mouth as needed (digestion).   Yes [provider]  sodium chloride (OCEAN) 0.65 % SOLN nasal spray Place 1 spray into both nostrils as needed for congestion.   Yes [provider]  spironolactone (ALDACTONE) 25 MG tablet TAKE 1 TABLET BY MOUTH ONCE DAILY Patient taking differently: Take 25 mg by mouth daily. 06/28/20  Yes Burchette, Alinda Sierras, MD  sucralfate (CARAFATE) 1 GM/10ML suspension TAKE 10 MLS (1 G TOTAL) BY MOUTH 2 (TWO) TIMES DAILY. Patient taking differently: Take 1 g by mouth as needed (reflux). 08/23/19  Yes Burchette, Alinda Sierras, MD  traMADol (ULTRAM) 50 MG tablet Take 50 mg by mouth as needed for moderate pain or severe pain.   Yes [provider]    Allergies    Actos [pioglitazone], Sulfonamide derivatives, Adhesive [tape], and Codeine  Review of Systems   Review of Systems  Constitutional: Negative for chills and fever.  HENT: Negative for ear pain and sore throat.   Eyes: Negative for pain and visual disturbance.  Respiratory: Positive for shortness of breath. Negative for cough.   Cardiovascular: Negative for chest pain and palpitations.  Gastrointestinal: Negative for abdominal pain and vomiting.  Genitourinary: Negative for dysuria and hematuria.  Musculoskeletal: Positive for back pain. Negative for arthralgias.  Skin: Negative for color change and rash.  Neurological: Negative for seizures and syncope.  All other systems reviewed and  are negative.   Physical Exam Updated Vital Signs BP (!) 127/57   Pulse 82   Temp 97.7 F (36.5 C) (Oral)   Resp 20   Ht 5\' 4"  (1.626 m)   Wt 40 kg   SpO2 97%   BMI 15.14 kg/m   Physical Exam Vitals and nursing note reviewed.  Constitutional:      General: She is not in acute distress.  Appearance: She is well-developed and well-nourished.  HENT:     Head: Normocephalic and atraumatic.  Eyes:     Conjunctiva/sclera: Conjunctivae normal.  Cardiovascular:     Rate and Rhythm: Normal rate and regular rhythm.     Heart sounds: No murmur heard.   Pulmonary:     Effort: Pulmonary effort is normal. No respiratory distress.     Breath sounds: Normal breath sounds.  Abdominal:     Palpations: Abdomen is soft.     Tenderness: There is no guarding or rebound.     Comments: Tenderness palpation in left lower quadrant and right lower quadrant  Musculoskeletal:        General: No deformity, signs of injury or edema.     Cervical back: Neck supple.     Comments: Some tenderness across lumbar region  Skin:    General: Skin is warm and dry.  Neurological:     Mental Status: She is alert.  Psychiatric:        Mood and Affect: Mood and affect normal.     ED Results / Procedures / Treatments   Labs (all labs ordered are listed, but only abnormal results are displayed) Labs Reviewed  BASIC METABOLIC PANEL - Abnormal; Notable for the following components:      Result Value   Sodium 130 (*)    CO2 21 (*)    Glucose, Bld 114 (*)    BUN 47 (*)    Creatinine, Ser 1.48 (*)    Calcium 8.7 (*)    GFR, Estimated 34 (*)    All other components within normal limits  CBC - Abnormal; Notable for the following components:   WBC 17.6 (*)    RDW 15.6 (*)    All other components within normal limits  URINALYSIS, ROUTINE W REFLEX MICROSCOPIC - Abnormal; Notable for the following components:   APPearance HAZY (*)    Hgb urine dipstick SMALL (*)    Leukocytes,Ua SMALL (*)     Bacteria, UA MANY (*)    All other components within normal limits  HEPATIC FUNCTION PANEL - Abnormal; Notable for the following components:   Albumin 2.6 (*)    AST 286 (*)    ALT 279 (*)    Bilirubin, Direct 0.4 (*)    All other components within normal limits  CBG MONITORING, ED - Abnormal; Notable for the following components:   Glucose-Capillary 105 (*)    All other components within normal limits  TROPONIN I (HIGH SENSITIVITY) - Abnormal; Notable for the following components:   Troponin I (High Sensitivity) 21 (*)    All other components within normal limits  TROPONIN I (HIGH SENSITIVITY) - Abnormal; Notable for the following components:   Troponin I (High Sensitivity) 24 (*)    All other components within normal limits  URINE CULTURE  CK  TSH  SEDIMENTATION RATE  VITAMIN B12  ACETAMINOPHEN LEVEL  C-REACTIVE PROTEIN    EKG EKG Interpretation  Date/Time:  Wednesday July 04 2020 08:18:19 EST Ventricular Rate:  83 PR Interval:    QRS Duration: 224 QT Interval:  447 QTC Calculation: 526 R Axis:   -9 Text Interpretation: Atrial fibrillation Left bundle branch block Artifact in lead(s) I II aVL V1 V2 V3 V4 V5 V6 Confirmed by Madalyn Rob [phone]) on 07/04/2020 8:38:55 AM   Radiology DG Chest 2 View  Result Date: 07/04/2020 CLINICAL DATA:  Syncope EXAM: CHEST - 2 VIEW COMPARISON:  April 01, 2018 FINDINGS: There is an equivocal  pleural effusion on each side with mild bibasilar atelectasis. No edema or airspace opacity. Heart is mildly enlarged with pulmonary vascularity normal. Patient is status post coronary artery bypass grafting. No adenopathy. There are foci of degenerative change in the thoracic spine. IMPRESSION: Mild bibasilar atelectasis with equivocal pleural effusion on each side. No airspace opacity or edema evident. Heart mildly enlarged. Status post coronary artery bypass grafting. Electronically Signed   By: Lowella Grip III M.D.   On: 07/04/2020 08:57    DG Ankle Complete Left  Result Date: 07/04/2020 CLINICAL DATA:  Fall, left ankle pain EXAM: LEFT ANKLE COMPLETE - 3+ VIEW COMPARISON:  None. FINDINGS: Three view radiograph left ankle demonstrates normal alignment. No acute fracture or dislocation. Ankle mortise is intact. No ankle effusion. Small plantar calcaneal spur. Mild bimalleolar soft tissue swelling. Diffuse subcutaneous edema noted involving the visualized left foreleg and left forefoot. IMPRESSION: Soft tissue swelling.  No fracture or dislocation. Electronically Signed   By: Fidela Salisbury MD   On: 07/04/2020 06:48   CT HEAD WO CONTRAST  Result Date: 07/04/2020 CLINICAL DATA:  87 year old female with dizziness.  Fall at home. EXAM: CT HEAD WITHOUT CONTRAST TECHNIQUE: Contiguous axial images were obtained from the base of the skull through the vertex without intravenous contrast. COMPARISON:  [place]  Head CT 07/20/2019. FINDINGS: Brain: Stable cerebral volume from last year, generalized volume loss. No midline shift, ventriculomegaly, mass effect, evidence of mass lesion, intracranial hemorrhage or evidence of cortically based acute infarction. Gray-white matter differentiation is within normal limits throughout the brain. Dystrophic basal ganglia calcifications. No focal encephalomalacia identified. Vascular: Calcified atherosclerosis at the skull base. No suspicious intracranial vascular hyperdensity. Skull: Stable, intact. Sinuses/Orbits: Chronic left frontal sinus opacification with small osteoma along the posterior left frontoethmoidal recess, unchanged. Other Visualized paranasal sinuses and mastoids are clear. Other: No orbit or scalp soft tissue injury identified. IMPRESSION: 1. No acute intracranial abnormality or acute traumatic injury identified. Stable generalized cerebral volume loss. 2. Chronic left frontal sinus disease. Electronically Signed   By: Genevie Ann M.D.   On: 07/04/2020 07:19   MR LUMBAR SPINE WO  CONTRAST  Result Date: 07/04/2020 CLINICAL DATA:  Fall at home. EXAM: MRI LUMBAR SPINE WITHOUT CONTRAST TECHNIQUE: Multiplanar, multisequence MR imaging of the lumbar spine was performed. No intravenous contrast was administered. COMPARISON:  CT abdomen pelvis 07/04/2020. MRI lumbar spine 01/26/2020 FINDINGS: Segmentation:  Normal Alignment: 5 mm retrolisthesis T12-L1 which has progressed since the prior MRI. 3 mm anterolisthesis L4-5 unchanged. Vertebrae: Progressive fracture superior endplate of L1, left greater than right. There is associated fracture of the inferior endplate of H47 right greater than left. There is bone marrow edema throughout the T12 and L1 vertebral bodies. There is endplate irregularity which has progressed from the prior MRI. Mild fluid lateral to the T12-L1 disc space on the right. Retropulsion of bone into the canal with mild spinal stenosis. No other fracture identified. Conus medullaris and cauda equina: Conus extends to the L1-2 level. Conus and cauda equina appear normal. Paraspinal and other soft tissues: Mild paravertebral edema on the right at T12-L1. No fluid collection or epidural abscess. Negative for paravertebral mass. Disc levels: T12-L1: Fracture of T12 and L1 as above with retropulsion of bone into the canal. No significant spinal stenosis. L1-2: Negative L2-3: Mild disc degeneration and mild facet degeneration. No significant spinal stenosis L3-4: Disc degeneration with diffuse endplate spurring and bilateral facet degeneration. Mild to moderate subarticular stenosis bilaterally L4-5: 3 mm anterolisthesis.  Diffuse disc bulging. Central and right-sided disc protrusion unchanged. Severe facet degeneration. Moderate to severe spinal stenosis. Severe subarticular and foraminal stenosis on the right. Moderate subarticular stenosis on the left L5-S1: Mild disc and facet degeneration.  Negative for stenosis. IMPRESSION: Moderate fractures of T12 and L1 as noted on CT earlier  today. This has progressed since MRI of 01/26/2020. There is diffuse bone marrow edema throughout T12 and L1. There is mild paravertebral edema in the soft tissues on the right. There is retropulsion of bone into the canal without significant spinal stenosis. No epidural abscess however there is concern infection is present in the disc space. This could be an indolent fracture or possibly superimposed infection that has occurred since the MRI of 01/26/2020. Alternatively, it is possible this is a sterile process with progressive mechanical fracture T12-L1 Multilevel degenerative change. Moderate to severe spinal stenosis L4-5 with severe subarticular and foraminal stenosis on the right. This is unchanged from the earlier MRI. Electronically Signed   By: Franchot Gallo M.D.   On: 07/04/2020 14:49   CT ABDOMEN PELVIS W CONTRAST  Result Date: 07/04/2020 CLINICAL DATA:  syncopal episode this morning and fell in the bathroom. Now having right sided abdominal tenderness EXAM: CT ABDOMEN AND PELVIS WITH CONTRAST TECHNIQUE: Multidetector CT imaging of the abdomen and pelvis was performed using the standard protocol following bolus administration of intravenous contrast. CONTRAST:  97mL OMNIPAQUE IOHEXOL 300 MG/ML  SOLN COMPARISON:  CT abdomen and pelvis January 25, 2019. FINDINGS: Lower chest: Bibasilar atelectasis. Thoracic aortic atherosclerosis. Small hiatal hernia. Hepatobiliary: No suspicious hepatic lesion. Gallbladder surgically absent. Similar prominence of the biliary tree, likely reservoir effect post cholecystectomy. Pancreas: Mild dorsal duct distension which is stable in comparison to prior studies. No focal pancreatic lesion. Spleen: No splenic injury or perisplenic hematoma. Adrenals/Urinary Tract: Bilateral adrenal glands are unremarkable. No hydronephrosis. Mild urothelial thickening at left-sided extrarenal pelvis. Nonobstructive 4 mm left lower pole renal stone. Bilateral low-density renal lesions  are unchanged in comparison to prior and likely cysts. Urinary bladder is moderately distended without suspicious wall thickening. Stomach/Bowel: Stomach is predominantly decompressed limiting evaluation. No suspicious small bowel wall thickening or dilation. The appendix is not definitely visualized however there is no inflammatory stranding along the cecum to suggest acute inflammation. Moderate volume of formed stool throughout the colon. Sigmoid colonic diverticulosis without findings of acute diverticulitis. Vascular/Lymphatic: Aortic atherosclerosis. No enlarged abdominal or pelvic lymph nodes. Reproductive: Status post hysterectomy. No adnexal masses. Other: No abdominopelvic ascites. Musculoskeletal: New anterior compression deformity of the L1 vertebral body with approximately 50% height loss and mild retropulsion of bone and disc into the spinal canal with narrowing of the canal. Global demineralization of bone. Multilevel degenerative changes spine. Degenerative changes bilateral hips. IMPRESSION: 1. New anterior compression deformity of the L1 vertebral body with approximately 50% height loss and mild retropulsion of bone and disc into the spinal canal with moderate narrowing of the canal at this level. 2. Apparent mild urothelial thickening of a left-sided extrarenal pelvis may reflect ascending urinary tract infection. Correlate with urinalysis. 3. Nonobstructive left nephrolithiasis. 4. Sigmoid colonic diverticulosis without findings of acute diverticulitis. 5. Moderate volume of formed stool throughout the colon. 6. Aortic atherosclerosis.  Aortic Atherosclerosis (ICD10-I70.0). Electronically Signed   By: Dahlia Bailiff MD   On: 07/04/2020 10:04    Procedures Procedures   Medications Ordered in ED Medications  acetaminophen (TYLENOL) tablet 500 mg (has no administration in time range)  amiodarone (PACERONE) tablet 100 mg (has  no administration in time range)  labetalol (NORMODYNE) tablet  100 mg (has no administration in time range)  levothyroxine (SYNTHROID) tablet 50 mcg (has no administration in time range)  hyoscyamine (LEVSIN SL) SL tablet 0.125 mg (has no administration in time range)  lactase (LACTAID) tablet 3,000 Units (has no administration in time range)  saccharomyces boulardii (FLORASTOR) capsule 250 mg (has no administration in time range)  sucralfate (CARAFATE) 1 GM/10ML suspension 1 g (has no administration in time range)  apixaban (ELIQUIS) tablet 2.5 mg (has no administration in time range)  ipratropium (ATROVENT) 0.06 % nasal spray 1 spray (has no administration in time range)  sodium chloride (OCEAN) 0.65 % nasal spray 1 spray (has no administration in time range)  lidocaine (LIDODERM) 5 % 1 patch (has no administration in time range)  0.9 %  sodium chloride infusion (has no administration in time range)  HYDROmorphone (DILAUDID) injection 0.5-1 mg (has no administration in time range)  traMADol (ULTRAM) tablet 50 mg (has no administration in time range)  polyethylene glycol (MIRALAX / GLYCOLAX) packet 17 g (has no administration in time range)  cefTRIAXone (ROCEPHIN) 1 g in sodium chloride 0.9 % 100 mL IVPB (has no administration in time range)  sodium chloride 0.9 % bolus 1,000 mL (0 mLs Intravenous Stopped 07/04/20 0918)  fentaNYL (SUBLIMAZE) injection 25 mcg (25 mcg Intravenous Given 07/04/20 0824)  iohexol (OMNIPAQUE) 300 MG/ML solution 75 mL (75 mLs Intravenous Contrast Given 07/04/20 0919)  cefTRIAXone (ROCEPHIN) 1 g in sodium chloride 0.9 % 100 mL IVPB (0 g Intravenous Stopped 07/04/20 1107)    ED Course  I have reviewed the triage vital signs and the nursing notes.  Pertinent labs & imaging results that were available during my care of the patient were reviewed by me and considered in my medical decision making (see chart for details).    MDM Rules/Calculators/A&P                         87 year old lady presents to ER with concern for syncope.  Upon  further clarification, episode consistent with near syncope.  On exam she was well-appearing in no distress with stable vital signs.  Noted some tenderness across her lumbar region as well as her lower abdomen.  Blood work was noted for leukocytosis, AKI, elevated LFTs.  Had mild transaminitis previously that was followed by primary doctor.  UA with possible UTI.  CT negative for acute abdominal pelvic pathology but did demonstrate L1 compression fracture.  Neuro intact.  Consulted neurosurgery, they recommended MRI and TLSO.   Given the multiple issues of syncope, AKI, bump in LFTs, L1 fracture, possible UTI, believe patient would benefit from admission for further observation and management.  Ordered antibiotics and admitted to the hospitalist.   Final Clinical Impression(s) / ED Diagnoses Final diagnoses:  AKI (acute kidney injury) ([place])  Transaminitis  Urinary tract infection without hematuria, site unspecified  Closed fracture of first lumbar vertebra, unspecified fracture morphology, initial encounter [facility])    Rx / DC Orders ED Discharge Orders    None       Lucrezia Starch, MD 07/04/20 1534

## 2020-07-04 NOTE — ED Notes (Signed)
Purewick placed on pt to collect urine culture.

## 2020-07-04 NOTE — Progress Notes (Signed)
Orthopedic Tech Progress Note Patient Details:  [name] Feb 19, 1933 [id number] Called in order to [facility] for a TLSO BRACE Patient ID: [name], female   DOB: [birth year], 87 y.o.   MRN: [id number]   Janit Pagan 07/04/2020, 2:28 PM

## 2020-07-04 NOTE — ED Triage Notes (Signed)
Patient arrives with [facility] from home. Patient reports she had a BM this morning, stood up, and she had a syncopal episode where her legs gave out. Patient is on Eliquis, unsure if she hit her head. Patient complaining of new L ankle pain and chronic back and hip pain.

## 2020-07-04 NOTE — ED Notes (Signed)
Pt not able to tolerate SSE, text message via computer sent to Dr Roosevelt Locks, also texted him regarding temp of 101.6

## 2020-07-04 NOTE — Progress Notes (Signed)
CK WNL, myositis ruled out. Likely elevated LFTs due to liver damage. Consider drug interaction of Statin and amiodarone. Statin on hold, will stop Amiodarone for now.

## 2020-07-04 NOTE — ED Notes (Signed)
saO2 at 90 to 91% with good pleth, placed on 2L o2/[place]

## 2020-07-05 DIAGNOSIS — N390 Urinary tract infection, site not specified: Secondary | ICD-10-CM | POA: Diagnosis present

## 2020-07-05 DIAGNOSIS — L8990 Pressure ulcer of unspecified site, unspecified stage: Secondary | ICD-10-CM | POA: Diagnosis present

## 2020-07-05 DIAGNOSIS — E43 Unspecified severe protein-calorie malnutrition: Secondary | ICD-10-CM | POA: Diagnosis present

## 2020-07-05 DIAGNOSIS — S32010A Wedge compression fracture of first lumbar vertebra, initial encounter for closed fracture: Secondary | ICD-10-CM | POA: Diagnosis present

## 2020-07-05 LAB — ACETAMINOPHEN LEVEL: Acetaminophen (Tylenol), Serum: <10 ug/mL — ABNORMAL LOW (ref 10–30)

## 2020-07-05 LAB — SARS CORONAVIRUS 2 (TAT 6-24 HRS): SARS Coronavirus 2: NEGATIVE

## 2020-07-05 LAB — COMPREHENSIVE METABOLIC PANEL
ALT: 333 U/L — ABNORMAL HIGH (ref 0–44)
AST: 350 U/L — ABNORMAL HIGH (ref 15–41)
Albumin: 2.3 g/dL — ABNORMAL LOW (ref 3.5–5.0)
Alkaline Phosphatase: 85 U/L (ref 38–126)
Anion gap: 9 (ref 5–15)
BUN: 37 mg/dL — ABNORMAL HIGH (ref 8–23)
CO2: 18 mmol/L — ABNORMAL LOW (ref 22–32)
Calcium: 7.8 mg/dL — ABNORMAL LOW (ref 8.9–10.3)
Chloride: 106 mmol/L (ref 98–111)
Creatinine, Ser: 1.25 mg/dL — ABNORMAL HIGH (ref 0.44–1.00)
GFR, Estimated: 42 mL/min — ABNORMAL LOW (ref >60)
Glucose, Bld: 94 mg/dL (ref 70–99)
Potassium: 4.6 mmol/L (ref 3.5–5.1)
Sodium: 133 mmol/L — ABNORMAL LOW (ref 135–145)
Total Bilirubin: 0.9 mg/dL (ref 0.3–1.2)
Total Protein: 6.1 g/dL — ABNORMAL LOW (ref 6.5–8.1)

## 2020-07-05 LAB — CBC
HCT: 37.7 % (ref 36.0–46.0)
Hemoglobin: 12.3 g/dL (ref 12.0–15.0)
MCH: 27.5 pg (ref 26.0–34.0)
MCHC: 32.6 g/dL (ref 30.0–36.0)
MCV: 84.2 fL (ref 80.0–100.0)
Platelets: 209 10*3/uL (ref 150–400)
RBC: 4.48 MIL/uL (ref 3.87–5.11)
RDW: 15.9 % — ABNORMAL HIGH (ref 11.5–15.5)
WBC: 14.6 10*3/uL — ABNORMAL HIGH (ref 4.0–10.5)
nRBC: 0 % (ref 0.0–0.2)

## 2020-07-05 LAB — T4, FREE: Free T4: 1.8 ng/dL — ABNORMAL HIGH (ref 0.61–1.12)

## 2020-07-05 MED ORDER — LIDOCAINE 5 % EX PTCH
1.0000 | MEDICATED_PATCH | CUTANEOUS | Status: DC
  Administered 2020-07-06 – 2020-07-13 (×8): 1 via TRANSDERMAL
  Filled 2020-07-05 (×7): qty 1

## 2020-07-05 MED ORDER — HYDROMORPHONE HCL 1 MG/ML IJ SOLN
0.5000 mg | INTRAMUSCULAR | Status: DC | PRN
  Administered 2020-07-07 – 2020-07-13 (×4): 0.5 mg via INTRAVENOUS
  Filled 2020-07-05 (×5): qty 0.5

## 2020-07-05 MED ORDER — ACETAMINOPHEN 500 MG PO TABS
1000.0000 mg | ORAL_TABLET | Freq: Three times a day (TID) | ORAL | Status: DC
  Administered 2020-07-05 – 2020-07-13 (×24): 1000 mg via ORAL
  Filled 2020-07-05 (×24): qty 2

## 2020-07-05 MED ORDER — CHLORHEXIDINE GLUCONATE CLOTH 2 % EX PADS
6.0000 | MEDICATED_PAD | Freq: Every day | CUTANEOUS | Status: DC
  Administered 2020-07-05 – 2020-07-12 (×8): 6 via TOPICAL

## 2020-07-05 MED ORDER — ADULT MULTIVITAMIN W/MINERALS CH
1.0000 | ORAL_TABLET | Freq: Every day | ORAL | Status: DC
  Administered 2020-07-05 – 2020-07-13 (×9): 1 via ORAL
  Filled 2020-07-05 (×9): qty 1

## 2020-07-05 MED ORDER — ENSURE ENLIVE PO LIQD
237.0000 mL | Freq: Two times a day (BID) | ORAL | Status: DC
  Administered 2020-07-05: 237 mL via ORAL

## 2020-07-05 MED ORDER — OXYCODONE HCL 5 MG PO TABS
5.0000 mg | ORAL_TABLET | ORAL | Status: DC | PRN
  Administered 2020-07-06 – 2020-07-13 (×12): 5 mg via ORAL
  Filled 2020-07-05 (×12): qty 1

## 2020-07-05 MED ORDER — ENSURE ENLIVE PO LIQD
237.0000 mL | Freq: Three times a day (TID) | ORAL | Status: DC
  Administered 2020-07-05 (×2): 237 mL via ORAL

## 2020-07-05 NOTE — Evaluation (Signed)
Occupational Therapy Evaluation Patient Details Name: [name] MRN: [id number] DOB: [birth year] Today's Date: 07/05/2020    History of Present Illness [name] is a 87 y.o. female with medical history significant of chronic back pain secondary to lumbar spine OA, chronic ambulation dysfunction, CAD status post CABG, chronic A. fib on Eliquis, HLD, aortic regurgitation and mitral regurgitation, presented with fall and question of near syncope. Pt also with poorly controlled back pain, difficulty ambulating and increasing weakness. Pt found with L1 burst fx, neurosurgeon consulted   Clinical Impression   PTA, pt lives with spouse and has had recent decline in mobility and strength, requiring use of RW. Pt able to complete ADLs though having difficulty secondary to pain and weakness. Pt presents with diagnoses above, anxious about pain with movement though participatory and motivated to return to independence. Provided education on spinal precautions and brace mgmt (handout left in room). Pt able to demo sit to stand with RW at min guard and pivot with RW at [place] A, increasing LE shakiness noted with attempts at taking steps. Due to deficits, pt requires Min A for UB ADLs and Max A for LB ADLs. Pt agreeable to sit up in recliner for lunch with husband present and supportive. Recommend CIR consult for postacute rehab. Plan to progress LB ADLs with spinal precautions, instruct in AE use as needed and improve standing balance with ADLs.      Follow Up Recommendations  Supervision/Assistance - 24 hour;CIR    Equipment Recommendations  3 in 1 bedside commode    Recommendations for Other Services Rehab consult     Precautions / Restrictions Precautions Precautions: Fall;Back Precaution Booklet Issued: Yes (comment) Required Braces or Orthoses: Spinal Brace Spinal Brace: Thoracolumbosacral orthotic Restrictions Weight Bearing Restrictions: No      Mobility Bed Mobility Overal bed  mobility: Needs Assistance Bed Mobility: Rolling;Sidelying to Sit Rolling: Min assist Sidelying to sit: Max assist       General bed mobility comments: Guided pt in log rolling, increased assist needed due to pain. Assist for trunk and LE advancement to EOB    Transfers Overall transfer level: Needs assistance Equipment used: Rolling walker (2 wheeled) Transfers: Sit to/from [facility] Sit to Stand: Min guard Stand pivot transfers: Min assist       General transfer comment: min guard for sit to stand from bedside with RW, cues for hand placement. able to demo taking steps towards sink at [place] with limitations due to sudden LE shakiness and pt reporting loss of strength. Assisted to sit in chair and then pivoted to recliner using RW at [place] for mgmt of RW and maintaining balance    Balance Overall balance assessment: Needs assistance;History of Falls Sitting-balance support: Feet supported;Single extremity supported;Bilateral upper extremity supported Sitting balance-Leahy Scale: Fair Sitting balance - Comments: initially required UE support EOB but then progressed to no UE support   Standing balance support: Bilateral upper extremity supported;During functional activity Standing balance-Leahy Scale: Poor Standing balance comment: reliant on UE support                           ADL either performed or assessed with clinical judgement   ADL Overall ADL's : Needs assistance/impaired Eating/Feeding: Set up;Sitting   Grooming: Set up;Sitting   Upper Body Bathing: Minimal assistance;Sitting   Lower Body Bathing: Moderate assistance;Sit to/from stand   Upper Body Dressing : Minimal assistance;Sitting Upper Body Dressing Details (indicate  cue type and reason): increased assist needed for brace mgmt Lower Body Dressing: Maximal assistance;Sit to/from stand Lower Body Dressing Details (indicate cue type and reason): difficulty in bringing feet to self  L > R due to pain.unable to bend to feet due to precautions. educated on AE trial, repositioning techniques or husband assist Toilet Transfer: Minimal assistance;Stand-pivot;BSC;RW   Toileting- Clothing Manipulation and Hygiene: Moderate assistance;Sit to/from stand         General ADL Comments: Pt limited primarily by back pain, also with subsequent muscle weakness and shakiness in standing with attempts at mobility (LE weakness > UE weakness)     Vision Baseline Vision/History: Wears glasses Wears Glasses: Reading only Patient Visual Report: No change from baseline Vision Assessment?: No apparent visual deficits     Perception     Praxis      Pertinent Vitals/Pain Pain Assessment: Faces Faces Pain Scale: Hurts even more Pain Location: back with movement Pain Descriptors / Indicators: Discomfort;Grimacing;Moaning Pain Intervention(s): Monitored during session;Premedicated before session;Patient requesting pain meds-RN notified;Limited activity within patient's tolerance     Hand Dominance Right   Extremity/Trunk Assessment Upper Extremity Assessment Upper Extremity Assessment: Generalized weakness (shakiness and tremors noted)   Lower Extremity Assessment Lower Extremity Assessment: Defer to PT evaluation   Cervical / Trunk Assessment Cervical / Trunk Assessment: Kyphotic   Communication Communication Communication: No difficulties   Cognition Arousal/Alertness: Awake/alert Behavior During Therapy: WFL for tasks assessed/performed;Anxious Overall Cognitive Status: Impaired/Different from baseline Area of Impairment: Attention;Following commands;Awareness;Problem solving                   Current Attention Level: Selective   Following Commands: Follows one step commands consistently;Follows one step commands with increased time;Follows multi-step commands with increased time   Awareness: Emergent Problem Solving: Slow processing;Difficulty  sequencing;Requires verbal cues General Comments: pleasant, anxious with possibility of increased pain during activity. Attempts all tasks, will need reinforcement of back precautions to maximize safety but aware of physical deficits/limitations   General Comments  Pt reports dizziness throughout session, BP WFL thoughout. Husband present and supportive. Reports he would be able to take off of work to assist wife as needed though has some medical issues himself    Exercises     Shoulder Instructions      Home Living Family/patient expects to be discharged to:: Private residence Living Arrangements: Spouse/significant other Available Help at Discharge: Family;Available PRN/intermittently (husband works 7-12) Type of Home: House Home Access: Stairs to enter [employer] of Steps: 2 Entrance Stairs-Rails: None Home Layout: Two level;Able to live on main level with bedroom/bathroom     Bathroom Shower/Tub: Occupational psychologist: Handicapped height Bathroom Accessibility: Yes   Home Equipment: Clinical cytogeneticist - 2 wheels;Walker - standard          Prior Functioning/Environment Level of Independence: Needs assistance  Gait / Transfers Assistance Needed: uses RW for household ambulation recently ADL's / Homemaking Assistance Needed: does birdbaths when husband is not around, in shower when he is, unable clean, tries to assist with laundry            OT Problem List: Decreased strength;Decreased activity tolerance;Impaired balance (sitting and/or standing);Decreased knowledge of use of DME or AE;Decreased knowledge of precautions;Pain      OT Treatment/Interventions: Self-care/ADL training;Therapeutic exercise;DME and/or AE instruction;Therapeutic activities;Patient/family education;Balance training    OT Goals(Current goals can be found in the care plan section) Acute Rehab OT Goals Patient Stated Goal: be able to do for myself,  get stronger OT Goal  Formulation: With patient/family Time For Goal Achievement: 07/19/20 Potential to Achieve Goals: Good ADL Goals Pt Will Perform Grooming: with set-up;standing Pt Will Perform Lower Body Bathing: with set-up;with adaptive equipment;sit to/from stand Pt Will Perform Lower Body Dressing: with set-up;sit to/from stand;with adaptive equipment Pt Will Transfer to Toilet: with set-up;ambulating;bedside commode;regular height toilet Pt Will Perform Toileting - Clothing Manipulation and hygiene: with set-up;sit to/from stand Additional ADL Goal #1: Pt to verbalize 3/3 spinal precautions accurately with no cues in order to maximize safety/healing  OT Frequency: Min 2X/week   Barriers to D/C:            Co-evaluation PT/OT/SLP Co-Evaluation/Treatment: Yes Reason for Co-Treatment: For patient/therapist safety;To address functional/ADL transfers;Other (comment) (high pain levels)   OT goals addressed during session: ADL's and self-care      AM-PAC OT "6 Clicks" Daily Activity     Outcome Measure Help from another person eating meals?: A Little Help from another person taking care of personal grooming?: A Little Help from another person toileting, which includes using toliet, bedpan, or urinal?: A Lot Help from another person bathing (including washing, rinsing, drying)?: A Lot Help from another person to put on and taking off regular upper body clothing?: A Little Help from another person to put on and taking off regular lower body clothing?: A Lot 6 Click Score: 15   End of Session Equipment Utilized During Treatment: Gait belt;Rolling walker;Back brace Nurse Communication: Mobility status;Patient requests pain meds;Precautions  Activity Tolerance: Patient tolerated treatment well;Patient limited by pain Patient left: in chair;with call bell/phone within reach;with chair alarm set;with family/visitor present  OT Visit Diagnosis: Unsteadiness on feet (R26.81);Other abnormalities of gait  and mobility (R26.89);Muscle weakness (generalized) (M62.81);History of falling (Z91.81);Pain Pain - part of body:  (back)                Time: 9604-5409 OT Time Calculation (min): 54 min Charges:  OT General Charges $OT Visit: 1 Visit OT Evaluation $OT Eval Moderate Complexity: 1 Mod OT Treatments $Self Care/Home Management : 8-22 mins $Therapeutic Activity: 8-22 mins  Malachy Chamber, OTR/L Acute Rehab Services Office: [phone]  Layla Maw 07/05/2020, 1:54 PM

## 2020-07-05 NOTE — Evaluation (Signed)
Physical Therapy Evaluation Patient Details Name: [name] MRN: [id number] DOB: Apr 07, 1933 Today's Date: 07/05/2020   History of Present Illness  87 y.o. female presented to ED 3/9 after fall and question of near syncope. CT lumbar spine reveals L1 acute compression fx. Admitted for treatment of possible syncope, L1 fx, Worsening of chronic transaminases and UTI. Pt reports numbness and tingling in bilateral LE R>L, bladder and bowel incontinence. TLSO for OOB  PMH: chronic back pain secondary to lumbar spine OA, chronic ambulation dysfunction, CAD status post CABG, chronic A. fib on Eliquis, HLD, aortic regurgitation and mitral regurgitation,  Clinical Impression  PTA pt living with husband in multistory home with bed and bath on first floor and 2 steps to enter. Pt reports she ambulates household distances with RW, she is able to take bird baths on her own but has husband assist with showers. Husband assists with most iADLs. Pt is currently limited in safe mobility by pain from L-1 fx, onset of LE weakness in weightbearing/ambulation likely caused by nerve compression at location on L5-L6 bulging disc, and bilateral decreased sensation in feet. Pt requires total A for donning TLSO in sitting. Pt is currently max A for bed mobility, min A for transfers and minA-modA and lowe into seated with ambulation of only 10 feet. Pt husband works in the morning but states he will be able to stay with wife 24/7 when she comes home for as long as he needs to. Despite increased pain pt driven to regain her mobility, agreeing to sit up in recliner even though it is uncomfortable with the TLSO, stating "I know I need to". Given pt support at home and desire to return to [facility], pt is a good candidate for CIR level rehab prior to return home.     Follow Up Recommendations CIR    Equipment Recommendations  Wheelchair (measurements PT);Wheelchair cushion (measurements PT)    Recommendations for Other Services  Rehab consult     Precautions / Restrictions Precautions Precautions: Fall;Back Precaution Booklet Issued: Yes (comment) Required Braces or Orthoses: Spinal Brace Spinal Brace: Thoracolumbosacral orthotic Restrictions Weight Bearing Restrictions: No      Mobility  Bed Mobility Overal bed mobility: Needs Assistance Bed Mobility: Rolling;Sidelying to Sit Rolling: Min assist Sidelying to sit: Max assist       General bed mobility comments: Guided pt in log rolling, increased assist needed due to pain. Assist for trunk and LE advancement to EOB    Transfers Overall transfer level: Needs assistance Equipment used: Rolling walker (2 wheeled) Transfers: Sit to/from [facility] Sit to Stand: Min guard Stand pivot transfers: Min assist       General transfer comment: min guard for sit to stand from bedside with RW, cues for hand placement. able to demo taking steps towards sink at [place] with limitations due to sudden LE shakiness and pt reporting loss of strength. Assisted to sit in chair and then pivoted to recliner using RW at [place] for mgmt of RW and maintaining balance  Ambulation/Gait Ambulation/Gait assistance: Min assist;Mod assist;+2 safety/equipment Gait Distance (Feet): 5 Feet Assistive device: Rolling walker (2 wheeled) Gait Pattern/deviations: Step-through pattern;Decreased step length - right;Decreased step length - left;Antalgic Gait velocity: slowed   General Gait Details: pt initates gait with min A, with increased distance and weight bearing pt with increasing LE weakness and knee buckling requiring modA and then straight back chair brought behind her to sit      Balance Overall balance assessment:  Needs assistance;History of Falls Sitting-balance support: Feet supported;Single extremity supported;Bilateral upper extremity supported Sitting balance-Leahy Scale: Fair Sitting balance - Comments: initially required UE support EOB but then  progressed to no UE support   Standing balance support: Bilateral upper extremity supported;During functional activity Standing balance-Leahy Scale: Poor Standing balance comment: reliant on UE support                             Pertinent Vitals/Pain Pain Assessment: Faces Faces Pain Scale: Hurts even more Pain Location: back with movement Pain Descriptors / Indicators: Discomfort;Grimacing;Moaning Pain Intervention(s): Limited activity within patient's tolerance;Monitored during session;Repositioned    Home Living Family/patient expects to be discharged to:: Private residence Living Arrangements: Spouse/significant other Available Help at Discharge: Family;Available PRN/intermittently (husband works 7-12) Type of Home: House Home Access: Stairs to enter Entrance Stairs-Rails: None Technical brewer of Steps: 2 Home Layout: Two level;Able to live on main level with bedroom/bathroom Home Equipment: Shower seat;Walker - 2 wheels;Walker - standard      Prior Function Level of Independence: Needs assistance   Gait / Transfers Assistance Needed: uses RW for household ambulation recently  ADL's / Homemaking Assistance Needed: does birdbaths when husband is not around, in shower when he is, unable clean, tries to assist with laundry        Hand Dominance   Dominant Hand: Right    Extremity/Trunk Assessment   Upper Extremity Assessment Upper Extremity Assessment: Defer to OT evaluation    Lower Extremity Assessment Lower Extremity Assessment: RLE deficits/detail;LLE deficits/detail RLE Deficits / Details: ROM WFL, strength 4/4 RLE Sensation: decreased light touch (from ankles down) RLE Coordination: decreased fine motor LLE Deficits / Details: ROM WFL, strength 4/5 LLE Sensation: decreased light touch (from ankles down) LLE Coordination: decreased fine motor    Cervical / Trunk Assessment Cervical / Trunk Assessment: Kyphotic  Communication    Communication: No difficulties  Cognition Arousal/Alertness: Awake/alert Behavior During Therapy: WFL for tasks assessed/performed;Anxious Overall Cognitive Status: Impaired/Different from baseline Area of Impairment: Attention;Following commands;Awareness;Problem solving                   Current Attention Level: Selective   Following Commands: Follows one step commands consistently;Follows one step commands with increased time;Follows multi-step commands with increased time   Awareness: Emergent Problem Solving: Slow processing;Difficulty sequencing;Requires verbal cues General Comments: pleasant, anxious with possibility of increased pain during activity. Attempts all tasks, will need reinforcement of back precautions to maximize safety but aware of physical deficits/limitations      General Comments General comments (skin integrity, edema, etc.): Pt reports dizziness throughout session, BP WFL thoughout. Husband present and supportive. Reports he would be able to take off of work to assist wife as needed though has some medical issues himself        Assessment/Plan    PT Assessment Patient needs continued PT services  PT Problem List Decreased strength;Decreased activity tolerance;Decreased mobility;Decreased balance;Impaired sensation;Pain       PT Treatment Interventions DME instruction;Gait training;Stair training;Functional mobility training;Therapeutic activities;Therapeutic exercise;Balance training;Cognitive remediation;Patient/family education    PT Goals (Current goals can be found in the Care Plan section)  Acute Rehab PT Goals Patient Stated Goal: be able to do for myself, get stronger PT Goal Formulation: With patient/family Time For Goal Achievement: 07/19/20 Potential to Achieve Goals: Fair    Frequency Min 3X/week   Barriers to discharge        Co-evaluation PT/OT/SLP Co-Evaluation/Treatment: Yes Reason  for Co-Treatment: For patient/therapist  safety (high pain level) PT goals addressed during session: Mobility/safety with mobility OT goals addressed during session: ADL's and self-care       AM-PAC PT "6 Clicks" Mobility  Outcome Measure Help needed turning from your back to your side while in a flat bed without using bedrails?: A Little Help needed moving from lying on your back to sitting on the side of a flat bed without using bedrails?: A Lot Help needed moving to and from a bed to a chair (including a wheelchair)?: A Little Help needed standing up from a chair using your arms (e.g., wheelchair or bedside chair)?: A Little Help needed to walk in hospital room?: A Lot Help needed climbing 3-5 steps with a railing? : Total 6 Click Score: 14    End of Session Equipment Utilized During Treatment: Gait belt;Back brace Activity Tolerance: Patient limited by pain;Other (comment) (LE radiculopathy) Patient left: in chair;with call bell/phone within reach;with family/visitor present;with chair alarm set Nurse Communication: Mobility status;Patient requests pain meds PT Visit Diagnosis: Unsteadiness on feet (R26.81);Other abnormalities of gait and mobility (R26.89);Muscle weakness (generalized) (M62.81);Difficulty in walking, not elsewhere classified (R26.2);Other symptoms and signs involving the nervous system (R29.898);Pain    Time: 3403-5248 PT Time Calculation (min) (ACUTE ONLY): 51 min   Charges:   PT Evaluation $PT Eval Moderate Complexity: 1 Mod          Elizabeth B. Migdalia Dk PT, DPT Acute Rehabilitation Services Pager [phone] Office [phone]   [place] 07/05/2020, 3:51 PM

## 2020-07-05 NOTE — Progress Notes (Addendum)
PROGRESS NOTE    [name]   IWL:[id number]  DOB: 29-Jan-1933  PCP: [name], MD    DOA: 07/04/2020 LOS: 1   Brief Narrative   [name] is a 87 y.o. female with medical history significant of chronic back pain secondary to lumbar spine OA and disc herniation, chronic ambulation dysfunction, CAD status post CABG, chronic A. fib on Eliquis, HLD, aortic regurgitation and mitral regurgitation, presented with fall and question of near syncope.  Evaluation in the ED: CT head negative.  CT lumbar spine showed acute L1 compression fracture.  Labs notable for AKI and worsening transaminitis.  Urinalysis consistent with UTI.  Admitted to hospitalist service with neurosurgery consulted.  Receiving IV antibiotics for UTI pending cultures.  Assessment & Plan   Principal Problem:   Compression fracture of L1 vertebra (HCC) Active Problems:   Hypothyroidism   Hyperlipidemia   Cervical spondylosis   Persistent atrial fibrillation (HCC)   Fall at home, initial encounter   Fall   AKI (acute kidney injury) ([place])   Transaminitis   Pressure injury of skin   Protein-calorie malnutrition, severe   Acute lower UTI   Fall, possible near syncope -by history sounds like a vasovagal event.  No dysrhythmias have been seen on cardiac monitoring thus far.  Has history of significant bradycardia with metoprolol, this was changed to amiodarone somewhat recently. --Telemetry --Follow-up orthostatic vitals --PT and OT as able --Fall precautions  Acute L1 compression fracture -neurosurgery consulted. --Conservative management with TSO brace --Pain control: Scheduled Tylenol, lidocaine patch, as needed tramadol or oxycodone, IV for breakthrough pain --d/c Foley From MRI report: ".Marland KitchenMarland KitchenMarland KitchenNo epidural abscess however there is concern infection is present in the disc space.  This could be an indolent fracture or possibly superimposed infection that has occurred since the MRI of 01/26/2020.   Alternatively, it is possible this is a sterile process with progressive mechanical fracture T12-L1" --History of fall leading to acute pain at this site supports mechanical fracture over infection.  History of Lumbar OA and Disc Herniation - with significant chronic back pain, recently progressive and now requiring walker to ambulate.  Follows with neurosurgery outpatient, thus far any surgical intervention deferred. --PT/OT --Pain control as above  UTI - without meeting criteria for sepsis on admission. --Continue empiric Rocephin --Follow cultures  AKI - POA, suspect pre-renal in setting of mild dehydration.  Cr 1.48>>1.25.  Improving with IV hydration. --Continue IV fluids, reduce rate --Monitor BMP  Transaminitis - chronic, worsening.  PCP has been trending LFTs lately, and there was a concern about amiodarone toxicity. --hold statin --CK within normal, rules out myositis --Limit Tylenol to 3 g/day  Chronic a-fib - Rate controlled.  --Continue Eliquis.   --Hold amiodarone given rising LFT's.   --Monitor on telemetry.   --Caution if need for HR control, had profound bradycardia with metoprolol - would consult cardiology   Valvular heart disease - severe MR, mod-severe AR, EF 50-55%, severe biatrial dilation on TEE in Dec 2021. --Monitor volume status with IV fluids, rate reduced --Per pt, plan is for mitral valve repair   History of atrophic gastritis and vitamin B12 deficiency -vitamin B12 level elevated.  May contribute to peripheral neuropathy.  Pressure Injury 07/05/20 Coccyx Mid;Bilateral Stage 1 -  Intact skin with non-blanchable redness of a localized area usually over a bony prominence. RED/NON BLANCHING (Active)  07/05/20 0352  Location: Coccyx  Location Orientation: Mid;Bilateral  Staging: Stage 1 -  Intact skin with non-blanchable redness of  a localized area usually over a bony prominence.  Wound Description (Comments): RED/NON BLANCHING  Present on Admission:  Yes     DVT prophylaxis: apixaban (ELIQUIS) tablet 2.5 mg Start: 07/04/20 1315 apixaban (ELIQUIS) tablet 2.5 mg   Diet:  Diet Orders (From admission, onward)    Start     Ordered   07/04/20 1309  Diet Heart Room service appropriate? Yes; Fluid consistency: Thin  Diet effective now       Question Answer Comment  Room service appropriate? Yes   Fluid consistency: Thin      07/04/20 1308            Code Status: Full Code    Subjective 07/05/20    Pt seen with husband at bedside.  She reports significant midline back pain.  Was not having dysuria at home, but had been having urgency and difficulty making it to bathroom on time.  No fever/chills.  Denies issues wit bowel control.     Disposition Plan & Communication   Status is: Inpatient  Remains inpatient appropriate because:IV treatments appropriate due to intensity of illness or inability to take PO    Dispo: The patient is from: Home              Anticipated d/c is to: Home vs SNF              Patient currently is not medically stable to d/c.   Difficult to place patient No   Family Communication: husband at bedside during rounds     Consults, Procedures, Significant Events   Consultants:   Neurosurgery  Procedures:   None  Antimicrobials:  Anti-infectives (From admission, onward)   Start     Dose/Rate Route Frequency Ordered Stop   07/05/20 1030  cefTRIAXone (ROCEPHIN) 1 g in sodium chloride 0.9 % 100 mL IVPB        1 g 200 mL/hr over 30 Minutes Intravenous Every 24 hours 07/04/20 1326 07/09/20 1029   07/04/20 1030  cefTRIAXone (ROCEPHIN) 1 g in sodium chloride 0.9 % 100 mL IVPB        1 g 200 mL/hr over 30 Minutes Intravenous  Once 07/04/20 1025 07/04/20 1107        Micro    Objective   Vitals:   07/05/20 0434 07/05/20 0800 07/05/20 0948 07/05/20 1156  BP: 119/66  (!) 110/49 (!) 128/53  Pulse: 84  90 92  Resp: 14  18 20   Temp: 98.2 F (36.8 C) 98.1 F (36.7 C) 98.6 F (37 C) 99.8 F  (37.7 C)  TempSrc: Axillary Oral Axillary Axillary  SpO2: 98%  93% 91%  Weight:      Height:        Intake/Output Summary (Last 24 hours) at 07/05/2020 1550 Last data filed at 07/05/2020 1458 Gross per 24 hour  Intake 1062.5 ml  Output 1400 ml  Net -337.5 ml   Filed Weights   07/04/20 0620 07/05/20 0200  Weight: 40 kg 45.6 kg    Physical Exam:  General exam: awake, alert, no acute distress HEENT: clear conjunctiva, anicteric sclera, moist mucus membranes, hearing grossly normal  Respiratory system: CTAB, no wheezes, rales or rhonchi, normal respiratory effort. Cardiovascular system: normal S8/N4, RRR, systolic murmur, no LE edema.   Gastrointestinal system: soft, NT, ND, no HSM felt, +bowel sounds. Central nervous system: A&O x3. no gross focal neurologic deficits, normal speech Extremities: moves all, no edema, normal tone Skin: pale, dry, intact, normal temperature Psychiatry: normal mood,  congruent affect, judgement and insight appear normal  Labs   Data Reviewed: I have personally reviewed following labs and imaging studies  CBC: Recent Labs  Lab 07/04/20 0627 07/05/20 0428  WBC 17.6* 14.6*  HGB 12.1 12.3  HCT 37.0 37.7  MCV 84.7 84.2  PLT 223 170   Basic Metabolic Panel: Recent Labs  Lab 07/04/20 0627 07/05/20 0428  NA 130* 133*  K 4.6 4.6  CL 98 106  CO2 21* 18*  GLUCOSE 114* 94  BUN 47* 37*  CREATININE 1.48* 1.25*  CALCIUM 8.7* 7.8*   GFR: Estimated Creatinine Clearance: 23.7 mL/min (A) (by C-G formula based on SCr of 1.25 mg/dL (H)). Liver Function Tests: Recent Labs  Lab 07/04/20 0756 07/05/20 0428  AST 286* 350*  ALT 279* 333*  ALKPHOS 81 85  BILITOT 1.0 0.9  PROT 6.8 6.1*  ALBUMIN 2.6* 2.3*   No results for input(s): LIPASE, AMYLASE in the last 168 hours. No results for input(s): AMMONIA in the last 168 hours. Coagulation Profile: No results for input(s): INR, PROTIME in the last 168 hours. Cardiac Enzymes: Recent Labs  Lab  07/04/20 1657  CKTOTAL 136   BNP (last 3 results) No results for input(s): PROBNP in the last 8760 hours. HbA1C: No results for input(s): HGBA1C in the last 72 hours. CBG: Recent Labs  Lab 07/04/20 0800  GLUCAP 105*   Lipid Profile: No results for input(s): CHOL, HDL, LDLCALC, TRIG, CHOLHDL, LDLDIRECT in the last 72 hours. Thyroid Function Tests: Recent Labs    07/04/20 1657  TSH 5.338*  FREET4 1.80*   Anemia Panel: Recent Labs    07/04/20 1657  VITAMINB12 1,518*   Sepsis Labs: No results for input(s): PROCALCITON, LATICACIDVEN in the last 168 hours.  Recent Results (from the past 240 hour(s))  Urine culture     Status: Abnormal (Preliminary result)   Collection Time: 07/04/20  8:09 AM   Specimen: Urine, Random  Result Value Ref Range Status   Specimen Description URINE, RANDOM  Final   Special Requests NONE  Final   Culture (A)  Final    >=100,000 COLONIES/mL ESCHERICHIA COLI SUSCEPTIBILITIES TO FOLLOW Performed at [facility], [address] [place], [place] [address]    Report Status PENDING  Incomplete  SARS CORONAVIRUS 2 (TAT 6-24 HRS) Nasopharyngeal Nasopharyngeal Swab     Status: None   Collection Time: 07/04/20 10:21 PM   Specimen: Nasopharyngeal Swab  Result Value Ref Range Status   SARS Coronavirus 2 NEGATIVE NEGATIVE Final    Comment: (NOTE) SARS-CoV-2 target nucleic acids are NOT DETECTED.  The SARS-CoV-2 RNA is generally detectable in upper and lower respiratory specimens during the acute phase of infection. Negative results do not preclude SARS-CoV-2 infection, do not rule out co-infections with other pathogens, and should not be used as the sole basis for treatment or other patient management decisions. Negative results must be combined with clinical observations, patient history, and epidemiological information. The expected result is Negative.  Fact Sheet for Patients: SugarRoll.be  Fact Sheet for  Healthcare Providers: https://www.woods-mathews.com/  This test is not yet approved or cleared by the [place] FDA and  has been authorized for detection and/or diagnosis of SARS-CoV-2 by FDA under an Emergency Use Authorization (EUA). This EUA will remain  in effect (meaning this test can be used) for the duration of the COVID-19 declaration under Se ction 564(b)(1) of the Act, 21 U.S.C. section 360bbb-3(b)(1), unless the authorization is terminated or revoked sooner.  Performed  at [facility], [place] [address], [place], [place] [address]       Imaging Studies   DG Chest 2 View  Result Date: 07/04/2020 CLINICAL DATA:  Syncope EXAM: CHEST - 2 VIEW COMPARISON:  April 01, 2018 FINDINGS: There is an equivocal pleural effusion on each side with mild bibasilar atelectasis. No edema or airspace opacity. Heart is mildly enlarged with pulmonary vascularity normal. Patient is status post coronary artery bypass grafting. No adenopathy. There are foci of degenerative change in the thoracic spine. IMPRESSION: Mild bibasilar atelectasis with equivocal pleural effusion on each side. No airspace opacity or edema evident. Heart mildly enlarged. Status post coronary artery bypass grafting. Electronically Signed   By: Lowella Grip III M.D.   On: 07/04/2020 08:57   DG Ankle Complete Left  Result Date: 07/04/2020 CLINICAL DATA:  Fall, left ankle pain EXAM: LEFT ANKLE COMPLETE - 3+ VIEW COMPARISON:  None. FINDINGS: Three view radiograph left ankle demonstrates normal alignment. No acute fracture or dislocation. Ankle mortise is intact. No ankle effusion. Small plantar calcaneal spur. Mild bimalleolar soft tissue swelling. Diffuse subcutaneous edema noted involving the visualized left foreleg and left forefoot. IMPRESSION: Soft tissue swelling.  No fracture or dislocation. Electronically Signed   By: Fidela Salisbury MD   On: 07/04/2020 06:48   CT HEAD WO CONTRAST  Result Date:  07/04/2020 CLINICAL DATA:  87 year old female with dizziness.  Fall at home. EXAM: CT HEAD WITHOUT CONTRAST TECHNIQUE: Contiguous axial images were obtained from the base of the skull through the vertex without intravenous contrast. COMPARISON:  [place]  Head CT 07/20/2019. FINDINGS: Brain: Stable cerebral volume from last year, generalized volume loss. No midline shift, ventriculomegaly, mass effect, evidence of mass lesion, intracranial hemorrhage or evidence of cortically based acute infarction. Gray-white matter differentiation is within normal limits throughout the brain. Dystrophic basal ganglia calcifications. No focal encephalomalacia identified. Vascular: Calcified atherosclerosis at the skull base. No suspicious intracranial vascular hyperdensity. Skull: Stable, intact. Sinuses/Orbits: Chronic left frontal sinus opacification with small osteoma along the posterior left frontoethmoidal recess, unchanged. Other Visualized paranasal sinuses and mastoids are clear. Other: No orbit or scalp soft tissue injury identified. IMPRESSION: 1. No acute intracranial abnormality or acute traumatic injury identified. Stable generalized cerebral volume loss. 2. Chronic left frontal sinus disease. Electronically Signed   By: Genevie Ann M.D.   On: 07/04/2020 07:19   MR LUMBAR SPINE WO CONTRAST  Result Date: 07/04/2020 CLINICAL DATA:  Fall at home. EXAM: MRI LUMBAR SPINE WITHOUT CONTRAST TECHNIQUE: Multiplanar, multisequence MR imaging of the lumbar spine was performed. No intravenous contrast was administered. COMPARISON:  CT abdomen pelvis 07/04/2020. MRI lumbar spine 01/26/2020 FINDINGS: Segmentation:  Normal Alignment: 5 mm retrolisthesis T12-L1 which has progressed since the prior MRI. 3 mm anterolisthesis L4-5 unchanged. Vertebrae: Progressive fracture superior endplate of L1, left greater than right. There is associated fracture of the inferior endplate of D98 right greater than left. There is bone marrow  edema throughout the T12 and L1 vertebral bodies. There is endplate irregularity which has progressed from the prior MRI. Mild fluid lateral to the T12-L1 disc space on the right. Retropulsion of bone into the canal with mild spinal stenosis. No other fracture identified. Conus medullaris and cauda equina: Conus extends to the L1-2 level. Conus and cauda equina appear normal. Paraspinal and other soft tissues: Mild paravertebral edema on the right at T12-L1. No fluid collection or epidural abscess. Negative for paravertebral mass. Disc levels: T12-L1: Fracture of T12 and  L1 as above with retropulsion of bone into the canal. No significant spinal stenosis. L1-2: Negative L2-3: Mild disc degeneration and mild facet degeneration. No significant spinal stenosis L3-4: Disc degeneration with diffuse endplate spurring and bilateral facet degeneration. Mild to moderate subarticular stenosis bilaterally L4-5: 3 mm anterolisthesis. Diffuse disc bulging. Central and right-sided disc protrusion unchanged. Severe facet degeneration. Moderate to severe spinal stenosis. Severe subarticular and foraminal stenosis on the right. Moderate subarticular stenosis on the left L5-S1: Mild disc and facet degeneration.  Negative for stenosis. IMPRESSION: Moderate fractures of T12 and L1 as noted on CT earlier today. This has progressed since MRI of 01/26/2020. There is diffuse bone marrow edema throughout T12 and L1. There is mild paravertebral edema in the soft tissues on the right. There is retropulsion of bone into the canal without significant spinal stenosis. No epidural abscess however there is concern infection is present in the disc space. This could be an indolent fracture or possibly superimposed infection that has occurred since the MRI of 01/26/2020. Alternatively, it is possible this is a sterile process with progressive mechanical fracture T12-L1 Multilevel degenerative change. Moderate to severe spinal stenosis L4-5 with  severe subarticular and foraminal stenosis on the right. This is unchanged from the earlier MRI. Electronically Signed   By: Franchot Gallo M.D.   On: 07/04/2020 14:49   CT ABDOMEN PELVIS W CONTRAST  Result Date: 07/04/2020 CLINICAL DATA:  syncopal episode this morning and fell in the bathroom. Now having right sided abdominal tenderness EXAM: CT ABDOMEN AND PELVIS WITH CONTRAST TECHNIQUE: Multidetector CT imaging of the abdomen and pelvis was performed using the standard protocol following bolus administration of intravenous contrast. CONTRAST:  37mL OMNIPAQUE IOHEXOL 300 MG/ML  SOLN COMPARISON:  CT abdomen and pelvis January 25, 2019. FINDINGS: Lower chest: Bibasilar atelectasis. Thoracic aortic atherosclerosis. Small hiatal hernia. Hepatobiliary: No suspicious hepatic lesion. Gallbladder surgically absent. Similar prominence of the biliary tree, likely reservoir effect post cholecystectomy. Pancreas: Mild dorsal duct distension which is stable in comparison to prior studies. No focal pancreatic lesion. Spleen: No splenic injury or perisplenic hematoma. Adrenals/Urinary Tract: Bilateral adrenal glands are unremarkable. No hydronephrosis. Mild urothelial thickening at left-sided extrarenal pelvis. Nonobstructive 4 mm left lower pole renal stone. Bilateral low-density renal lesions are unchanged in comparison to prior and likely cysts. Urinary bladder is moderately distended without suspicious wall thickening. Stomach/Bowel: Stomach is predominantly decompressed limiting evaluation. No suspicious small bowel wall thickening or dilation. The appendix is not definitely visualized however there is no inflammatory stranding along the cecum to suggest acute inflammation. Moderate volume of formed stool throughout the colon. Sigmoid colonic diverticulosis without findings of acute diverticulitis. Vascular/Lymphatic: Aortic atherosclerosis. No enlarged abdominal or pelvic lymph nodes. Reproductive: Status post  hysterectomy. No adnexal masses. Other: No abdominopelvic ascites. Musculoskeletal: New anterior compression deformity of the L1 vertebral body with approximately 50% height loss and mild retropulsion of bone and disc into the spinal canal with narrowing of the canal. Global demineralization of bone. Multilevel degenerative changes spine. Degenerative changes bilateral hips. IMPRESSION: 1. New anterior compression deformity of the L1 vertebral body with approximately 50% height loss and mild retropulsion of bone and disc into the spinal canal with moderate narrowing of the canal at this level. 2. Apparent mild urothelial thickening of a left-sided extrarenal pelvis may reflect ascending urinary tract infection. Correlate with urinalysis. 3. Nonobstructive left nephrolithiasis. 4. Sigmoid colonic diverticulosis without findings of acute diverticulitis. 5. Moderate volume of formed stool throughout the colon. 6. Aortic atherosclerosis.  Aortic Atherosclerosis (ICD10-I70.0). Electronically Signed   By: Dahlia Bailiff MD   On: 07/04/2020 10:04     Medications   Scheduled Meds: . acetaminophen  1,000 mg Oral Q8H  . apixaban  2.5 mg Oral BID  . Chlorhexidine Gluconate Cloth  6 each Topical Daily  . feeding supplement  237 mL Oral TID BM  . ipratropium  1 spray Each Nare Q12H  . levothyroxine  50 mcg Oral QHS  . lidocaine  1 patch Transdermal Q24H  . multivitamin with minerals  1 tablet Oral Daily  . polyethylene glycol  17 g Oral Daily  . saccharomyces boulardii  250 mg Oral Daily   Continuous Infusions: . cefTRIAXone (ROCEPHIN)  IV 1 g (07/05/20 0934)       LOS: 1 day    Time spent: 30 minutes    Ezekiel Slocumb, DO [facility]  07/05/2020, 3:50 PM      If 7PM-7AM, please contact night-coverage. How to contact the [facility] Attending or Consulting provider [place] or covering provider during after hours [place], for this patient?    1. Check the care team in [facility] and look for a)  attending/consulting [facility] provider listed and b) the [facility] team listed 2. Log into [email or web address] and use 's universal password to access. If you do not have the password, please contact the hospital operator. 3. Locate the [facility] provider you are looking for under [facility] and page to a number that you can be directly reached. 4. If you still have difficulty reaching the provider, please page the [facility] (Director on Call) for the Hospitalists listed on amion for assistance.

## 2020-07-05 NOTE — Progress Notes (Signed)
Admitted to rm 5W-24. Denies pain, dizziness, shortness of breath at this time. Report was received from [place], [place] from ED. Placed on cardiac monitor, VS WNL. NS started at ordered rate of 125 cc/hr. CHG bath completed. Call light in reach. Continuing to monitor.

## 2020-07-05 NOTE — Progress Notes (Signed)
Initial Nutrition Assessment  DOCUMENTATION CODES:  Severe malnutrition in context of chronic illness  INTERVENTION:  Continue Ensure Enlive po TID, each supplement provides 350 kcal and 20 grams of protein.  Add Magic cup TID with meals, each supplement provides 290 kcal and 9 grams of protein.  Add MVI with minerals daily.  Consider liberalizing diet to regular to promote PO intake - spoke with MD.  NUTRITION DIAGNOSIS:  Severe Malnutrition related to chronic illness (chronic lower back pain 2/2 lumbar spine OA) as evidenced by energy intake < or equal to 75% for > or equal to 1 month,severe fat depletion,severe muscle depletion,percent weight loss.  GOAL:  Patient will meet greater than or equal to 90% of their needs  MONITOR:  PO intake,Supplement acceptance,Diet advancement  REASON FOR ASSESSMENT:  Malnutrition Screening Tool   ASSESSMENT:  87 yo female with PMH of chronic back pain 2/2 lumbar spine OA, chronic ambulation dysfunction, CAD s/p CABG, chronic A-fib, HLD, aortic and mitral regurgitation, HTN, IBS, and GERD who presents with a fall, AKI, and UTI. History of atrophic gastritis and B12 deficiency.  Spoke with pt at bedside. Pt reports only being able to eat dark chocolate, ice cream, and drinks 2 Boost Max supplements per day. She does not keep anything else down. She also reports having a few bites of potatoes and beef last night, but that has been the only food she has had in a while.  Pt reports she has had a weight loss of 11-12 lbs in the past two weeks. Pt weight closer to weight taken on 07/04/20 on admission of 40 kg (88 lbs). Per Epic, pt has lost 5.5 lbs (6% BW) in 1.5 months, which is significant.  She also reports getting around with a walker okay at home, but she recently had been able to walk unassisted until a few weeks ago.  Encouraged her to drink the Ensure while she's here and eat what she can. Open to trying Magic Cups in chocolate. Also  recommending a MVI daily.  Relevant Medications: miralax, dilaudid Labs: reviewed; Na 133 Vitamin B12: 1,518 (H) (06/2020)  NUTRITION - FOCUSED PHYSICAL EXAM: Flowsheet Row Most Recent Value  Orbital Region Moderate depletion  Upper Arm Region Severe depletion  Thoracic and Lumbar Region Severe depletion  Buccal Region Moderate depletion  Temple Region Moderate depletion  Clavicle Bone Region Severe depletion  Clavicle and Acromion Bone Region Severe depletion  Scapular Bone Region Unable to assess  [pain when shifting onto side or leaning forward]  Dorsal Hand Moderate depletion  Patellar Region Severe depletion  Anterior Thigh Region Severe depletion  Posterior Calf Region Severe depletion  Edema (RD Assessment) None  Hair Reviewed  Eyes Reviewed  Mouth Reviewed  Skin Reviewed  Nails Reviewed     Diet Order:   Diet Order            Diet Heart Room service appropriate? Yes; Fluid consistency: Thin  Diet effective now                EDUCATION NEEDS:  Education needs have been addressed  Skin:  Skin Assessment: Reviewed RN Assessment (Stage 1 mid bilateral coccyx ulcer)  Last BM:  07/05/20 - Type 1  Height:  Ht Readings from Last 1 Encounters:  07/05/20 5\' 4"  (1.626 m)   Weight:  Wt Readings from Last 1 Encounters:  07/05/20 45.6 kg   Ideal Body Weight:  54.5 kg  BMI:  Body mass index is 17.26 kg/m.  Estimated  Nutritional Needs:  Kcal:  1500-1700 Protein:  80-95 grams Fluid:  >1.5 L  Derrel Nip, RD, LDN Registered Dietitian After Hours/Weekend Pager # in [place]

## 2020-07-05 NOTE — Progress Notes (Signed)
Providing Compassionate, Quality Care - Together   Subjective: Patient reports no issues overnight.  Objective: Vital signs in last 24 hours: Temp:  [98.1 F (36.7 C)-101.6 F (38.7 C)] 99.8 F (37.7 C) (03/10 1156) Pulse Rate:  [74-92] 92 (03/10 1156) Resp:  [14-25] 20 (03/10 1156) BP: (101-128)/(49-70) 128/53 (03/10 1156) SpO2:  [91 %-100 %] 91 % (03/10 1156) Weight:  [45.6 kg] 45.6 kg (03/10 0200)  Intake/Output from previous day: 03/09 0701 - 03/10 0700 In: 2162.5 [I.V.:1062.5; IV Piggyback:1100] Out: 1180 [Urine:1180] Intake/Output this shift: Total I/O In: -  Out: 150 [Urine:150]  Alert and oriented x 4 PERRLA CN II-XII grossly intact MAE, Strength intact, decreased sensation in feet   Lab Results: Recent Labs    07/04/20 0627 07/05/20 0428  WBC 17.6* 14.6*  HGB 12.1 12.3  HCT 37.0 37.7  PLT 223 209   BMET Recent Labs    07/04/20 0627 07/05/20 0428  NA 130* 133*  K 4.6 4.6  CL 98 106  CO2 21* 18*  GLUCOSE 114* 94  BUN 47* 37*  CREATININE 1.48* 1.25*  CALCIUM 8.7* 7.8*    Studies/Results: DG Chest 2 View  Result Date: 07/04/2020 CLINICAL DATA:  Syncope EXAM: CHEST - 2 VIEW COMPARISON:  April 01, 2018 FINDINGS: There is an equivocal pleural effusion on each side with mild bibasilar atelectasis. No edema or airspace opacity. Heart is mildly enlarged with pulmonary vascularity normal. Patient is status post coronary artery bypass grafting. No adenopathy. There are foci of degenerative change in the thoracic spine. IMPRESSION: Mild bibasilar atelectasis with equivocal pleural effusion on each side. No airspace opacity or edema evident. Heart mildly enlarged. Status post coronary artery bypass grafting. Electronically Signed   By: Lowella Grip III M.D.   On: 07/04/2020 08:57   DG Ankle Complete Left  Result Date: 07/04/2020 CLINICAL DATA:  Fall, left ankle pain EXAM: LEFT ANKLE COMPLETE - 3+ VIEW COMPARISON:  None. FINDINGS: Three view  radiograph left ankle demonstrates normal alignment. No acute fracture or dislocation. Ankle mortise is intact. No ankle effusion. Small plantar calcaneal spur. Mild bimalleolar soft tissue swelling. Diffuse subcutaneous edema noted involving the visualized left foreleg and left forefoot. IMPRESSION: Soft tissue swelling.  No fracture or dislocation. Electronically Signed   By: Fidela Salisbury MD   On: 07/04/2020 06:48   CT HEAD WO CONTRAST  Result Date: 07/04/2020 CLINICAL DATA:  87 year old female with dizziness.  Fall at home. EXAM: CT HEAD WITHOUT CONTRAST TECHNIQUE: Contiguous axial images were obtained from the base of the skull through the vertex without intravenous contrast. COMPARISON:  [place]  Head CT 07/20/2019. FINDINGS: Brain: Stable cerebral volume from last year, generalized volume loss. No midline shift, ventriculomegaly, mass effect, evidence of mass lesion, intracranial hemorrhage or evidence of cortically based acute infarction. Gray-white matter differentiation is within normal limits throughout the brain. Dystrophic basal ganglia calcifications. No focal encephalomalacia identified. Vascular: Calcified atherosclerosis at the skull base. No suspicious intracranial vascular hyperdensity. Skull: Stable, intact. Sinuses/Orbits: Chronic left frontal sinus opacification with small osteoma along the posterior left frontoethmoidal recess, unchanged. Other Visualized paranasal sinuses and mastoids are clear. Other: No orbit or scalp soft tissue injury identified. IMPRESSION: 1. No acute intracranial abnormality or acute traumatic injury identified. Stable generalized cerebral volume loss. 2. Chronic left frontal sinus disease. Electronically Signed   By: Genevie Ann M.D.   On: 07/04/2020 07:19   MR LUMBAR SPINE WO CONTRAST  Result Date: 07/04/2020 CLINICAL DATA:  Fall at  home. EXAM: MRI LUMBAR SPINE WITHOUT CONTRAST TECHNIQUE: Multiplanar, multisequence MR imaging of the lumbar spine was  performed. No intravenous contrast was administered. COMPARISON:  CT abdomen pelvis 07/04/2020. MRI lumbar spine 01/26/2020 FINDINGS: Segmentation:  Normal Alignment: 5 mm retrolisthesis T12-L1 which has progressed since the prior MRI. 3 mm anterolisthesis L4-5 unchanged. Vertebrae: Progressive fracture superior endplate of L1, left greater than right. There is associated fracture of the inferior endplate of U04 right greater than left. There is bone marrow edema throughout the T12 and L1 vertebral bodies. There is endplate irregularity which has progressed from the prior MRI. Mild fluid lateral to the T12-L1 disc space on the right. Retropulsion of bone into the canal with mild spinal stenosis. No other fracture identified. Conus medullaris and cauda equina: Conus extends to the L1-2 level. Conus and cauda equina appear normal. Paraspinal and other soft tissues: Mild paravertebral edema on the right at T12-L1. No fluid collection or epidural abscess. Negative for paravertebral mass. Disc levels: T12-L1: Fracture of T12 and L1 as above with retropulsion of bone into the canal. No significant spinal stenosis. L1-2: Negative L2-3: Mild disc degeneration and mild facet degeneration. No significant spinal stenosis L3-4: Disc degeneration with diffuse endplate spurring and bilateral facet degeneration. Mild to moderate subarticular stenosis bilaterally L4-5: 3 mm anterolisthesis. Diffuse disc bulging. Central and right-sided disc protrusion unchanged. Severe facet degeneration. Moderate to severe spinal stenosis. Severe subarticular and foraminal stenosis on the right. Moderate subarticular stenosis on the left L5-S1: Mild disc and facet degeneration.  Negative for stenosis. IMPRESSION: Moderate fractures of T12 and L1 as noted on CT earlier today. This has progressed since MRI of 01/26/2020. There is diffuse bone marrow edema throughout T12 and L1. There is mild paravertebral edema in the soft tissues on the right.  There is retropulsion of bone into the canal without significant spinal stenosis. No epidural abscess however there is concern infection is present in the disc space. This could be an indolent fracture or possibly superimposed infection that has occurred since the MRI of 01/26/2020. Alternatively, it is possible this is a sterile process with progressive mechanical fracture T12-L1 Multilevel degenerative change. Moderate to severe spinal stenosis L4-5 with severe subarticular and foraminal stenosis on the right. This is unchanged from the earlier MRI. Electronically Signed   By: Franchot Gallo M.D.   On: 07/04/2020 14:49   CT ABDOMEN PELVIS W CONTRAST  Result Date: 07/04/2020 CLINICAL DATA:  syncopal episode this morning and fell in the bathroom. Now having right sided abdominal tenderness EXAM: CT ABDOMEN AND PELVIS WITH CONTRAST TECHNIQUE: Multidetector CT imaging of the abdomen and pelvis was performed using the standard protocol following bolus administration of intravenous contrast. CONTRAST:  25mL OMNIPAQUE IOHEXOL 300 MG/ML  SOLN COMPARISON:  CT abdomen and pelvis January 25, 2019. FINDINGS: Lower chest: Bibasilar atelectasis. Thoracic aortic atherosclerosis. Small hiatal hernia. Hepatobiliary: No suspicious hepatic lesion. Gallbladder surgically absent. Similar prominence of the biliary tree, likely reservoir effect post cholecystectomy. Pancreas: Mild dorsal duct distension which is stable in comparison to prior studies. No focal pancreatic lesion. Spleen: No splenic injury or perisplenic hematoma. Adrenals/Urinary Tract: Bilateral adrenal glands are unremarkable. No hydronephrosis. Mild urothelial thickening at left-sided extrarenal pelvis. Nonobstructive 4 mm left lower pole renal stone. Bilateral low-density renal lesions are unchanged in comparison to prior and likely cysts. Urinary bladder is moderately distended without suspicious wall thickening. Stomach/Bowel: Stomach is predominantly  decompressed limiting evaluation. No suspicious small bowel wall thickening or dilation. The appendix is not  definitely visualized however there is no inflammatory stranding along the cecum to suggest acute inflammation. Moderate volume of formed stool throughout the colon. Sigmoid colonic diverticulosis without findings of acute diverticulitis. Vascular/Lymphatic: Aortic atherosclerosis. No enlarged abdominal or pelvic lymph nodes. Reproductive: Status post hysterectomy. No adnexal masses. Other: No abdominopelvic ascites. Musculoskeletal: New anterior compression deformity of the L1 vertebral body with approximately 50% height loss and mild retropulsion of bone and disc into the spinal canal with narrowing of the canal. Global demineralization of bone. Multilevel degenerative changes spine. Degenerative changes bilateral hips. IMPRESSION: 1. New anterior compression deformity of the L1 vertebral body with approximately 50% height loss and mild retropulsion of bone and disc into the spinal canal with moderate narrowing of the canal at this level. 2. Apparent mild urothelial thickening of a left-sided extrarenal pelvis may reflect ascending urinary tract infection. Correlate with urinalysis. 3. Nonobstructive left nephrolithiasis. 4. Sigmoid colonic diverticulosis without findings of acute diverticulitis. 5. Moderate volume of formed stool throughout the colon. 6. Aortic atherosclerosis.  Aortic Atherosclerosis (ICD10-I70.0). Electronically Signed   By: Dahlia Bailiff MD   On: 07/04/2020 10:04    Assessment/Plan: Mrs. Shvartsman is one day status post fall in her bathroom. She has a history of back pain, but this increased significantly following her fall. CT lumbar spine revealed an L1 compression deformity with mild retropulsion of bone and disc into the spinal canal. Follow-up MRI concerning for osteomyelitis/discitis. Patient with elevated sed rate.   LOS: 1 day   -Continue TLSO when OOB for pain  relief. -Recommend ID consult for further recommendations regarding concern for osteomyelitis/discitis. -No Neurosurgical intervention recommended at this time. -Mobilize with therapies.  Viona Gilmore, DNP, AGNP-C Nurse Practitioner  [facility] [place] [address], [place], [place] [address] P: [phone]     F: [phone]  07/05/2020, 11:50 AM

## 2020-07-06 ENCOUNTER — Inpatient Hospital Stay (HOSPITAL_COMMUNITY): Payer: Medicare Other

## 2020-07-06 DIAGNOSIS — M4620 Osteomyelitis of vertebra, site unspecified: Secondary | ICD-10-CM | POA: Diagnosis not present

## 2020-07-06 DIAGNOSIS — I340 Nonrheumatic mitral (valve) insufficiency: Secondary | ICD-10-CM

## 2020-07-06 DIAGNOSIS — I350 Nonrheumatic aortic (valve) stenosis: Secondary | ICD-10-CM

## 2020-07-06 DIAGNOSIS — R7401 Elevation of levels of liver transaminase levels: Secondary | ICD-10-CM

## 2020-07-06 DIAGNOSIS — I361 Nonrheumatic tricuspid (valve) insufficiency: Secondary | ICD-10-CM | POA: Diagnosis not present

## 2020-07-06 DIAGNOSIS — R55 Syncope and collapse: Secondary | ICD-10-CM

## 2020-07-06 DIAGNOSIS — D649 Anemia, unspecified: Secondary | ICD-10-CM

## 2020-07-06 DIAGNOSIS — I4819 Other persistent atrial fibrillation: Secondary | ICD-10-CM

## 2020-07-06 DIAGNOSIS — M4646 Discitis, unspecified, lumbar region: Secondary | ICD-10-CM

## 2020-07-06 DIAGNOSIS — M47812 Spondylosis without myelopathy or radiculopathy, cervical region: Secondary | ICD-10-CM | POA: Diagnosis not present

## 2020-07-06 DIAGNOSIS — N179 Acute kidney failure, unspecified: Secondary | ICD-10-CM | POA: Diagnosis not present

## 2020-07-06 DIAGNOSIS — I351 Nonrheumatic aortic (valve) insufficiency: Secondary | ICD-10-CM

## 2020-07-06 DIAGNOSIS — E871 Hypo-osmolality and hyponatremia: Secondary | ICD-10-CM

## 2020-07-06 DIAGNOSIS — R7989 Other specified abnormal findings of blood chemistry: Secondary | ICD-10-CM

## 2020-07-06 DIAGNOSIS — E872 Acidosis: Secondary | ICD-10-CM

## 2020-07-06 DIAGNOSIS — S32010A Wedge compression fracture of first lumbar vertebra, initial encounter for closed fracture: Secondary | ICD-10-CM

## 2020-07-06 DIAGNOSIS — N390 Urinary tract infection, site not specified: Secondary | ICD-10-CM

## 2020-07-06 DIAGNOSIS — R778 Other specified abnormalities of plasma proteins: Secondary | ICD-10-CM

## 2020-07-06 LAB — CBC
HCT: 29.3 % — ABNORMAL LOW (ref 36.0–46.0)
Hemoglobin: 10 g/dL — ABNORMAL LOW (ref 12.0–15.0)
MCH: 28.5 pg (ref 26.0–34.0)
MCHC: 34.1 g/dL (ref 30.0–36.0)
MCV: 83.5 fL (ref 80.0–100.0)
Platelets: 179 10*3/uL (ref 150–400)
RBC: 3.51 MIL/uL — ABNORMAL LOW (ref 3.87–5.11)
RDW: 15.9 % — ABNORMAL HIGH (ref 11.5–15.5)
WBC: 10.7 10*3/uL — ABNORMAL HIGH (ref 4.0–10.5)
nRBC: 0 % (ref 0.0–0.2)

## 2020-07-06 LAB — COMPREHENSIVE METABOLIC PANEL
ALT: 276 U/L — ABNORMAL HIGH (ref 0–44)
AST: 260 U/L — ABNORMAL HIGH (ref 15–41)
Albumin: 1.9 g/dL — ABNORMAL LOW (ref 3.5–5.0)
Alkaline Phosphatase: 78 U/L (ref 38–126)
Anion gap: 6 (ref 5–15)
BUN: 41 mg/dL — ABNORMAL HIGH (ref 8–23)
CO2: 19 mmol/L — ABNORMAL LOW (ref 22–32)
Calcium: 7.4 mg/dL — ABNORMAL LOW (ref 8.9–10.3)
Chloride: 105 mmol/L (ref 98–111)
Creatinine, Ser: 1.34 mg/dL — ABNORMAL HIGH (ref 0.44–1.00)
GFR, Estimated: 39 mL/min — ABNORMAL LOW (ref >60)
Glucose, Bld: 180 mg/dL — ABNORMAL HIGH (ref 70–99)
Potassium: 4.6 mmol/L (ref 3.5–5.1)
Sodium: 130 mmol/L — ABNORMAL LOW (ref 135–145)
Total Bilirubin: 0.7 mg/dL (ref 0.3–1.2)
Total Protein: 4.8 g/dL — ABNORMAL LOW (ref 6.5–8.1)

## 2020-07-06 LAB — URINE CULTURE: Culture: >=100000 — AB

## 2020-07-06 MED ORDER — BOOST PLUS PO LIQD
237.0000 mL | Freq: Three times a day (TID) | ORAL | Status: DC
  Administered 2020-07-06 – 2020-07-13 (×14): 237 mL via ORAL
  Filled 2020-07-06 (×24): qty 237

## 2020-07-06 MED ORDER — BOOST / RESOURCE BREEZE PO LIQD CUSTOM
1.0000 | Freq: Three times a day (TID) | ORAL | Status: DC
  Administered 2020-07-06 – 2020-07-12 (×13): 1 via ORAL

## 2020-07-06 NOTE — Progress Notes (Signed)
°  Echocardiogram 2D Echocardiogram has been performed.  [name] F 07/06/2020, 5:23 PM

## 2020-07-06 NOTE — NC FL2 (Signed)
[place] LEVEL OF CARE SCREENING TOOL     IDENTIFICATION  Patient Name: [name] Birthdate: 11-26-1932 Sex: female Admission Date (Current Location): 07/04/2020  [facility] and [place] Number:  Herbalist and Address:  [facility], [place] [address], [place], [place] [address]      Provider Number: [id number]  Attending Physician Name and Address:  Mercy Riding, MD  Relative Name and Phone Number:  Sally, Valdez , spouse, [phone]    Current Level of Care: Hospital Recommended Level of Care: [place] Prior Approval Number:    Date Approved/Denied:   PASRR Number: [id number]  Discharge Plan: SNF    Current Diagnoses: Patient Active Problem List   Diagnosis Date Noted  . Pressure injury of skin 07/05/2020  . Protein-calorie malnutrition, severe 07/05/2020  . Acute lower UTI 07/05/2020  . Compression fracture of L1 vertebra ([place]) 07/05/2020  . Fall at home, initial encounter 07/04/2020  . Fall 07/04/2020  . AKI (acute kidney injury) ([place])   . Transaminitis   . Allergic rhinitis due to pollen 06/08/2020  . Occult blood in stools 06/08/2020  . Gastro-esophageal reflux disease without esophagitis 06/08/2020  . Vasomotor rhinitis 06/08/2020  . Wheezing 06/08/2020  . Diarrhea 10/27/2019  . Gastric polyp   . Intervertebral disc rupture 12/27/2018  . Lymphocytic colitis 09/15/2018  . B12 deficiency 06/29/2018  . Persistent atrial fibrillation ([place]) 04/05/2018  . Loose stools 07/06/2017  . Rectal bleeding 07/06/2017  . Generalized abdominal pain 07/06/2017  . Cervical spondylosis 03/11/2017  . Loss of weight 11/18/2013  . Nausea alone 11/18/2013  . Abdominal pain, epigastric 11/18/2013  . Anemia, iron deficiency 01/27/2013  . Temporomandibular joint-pain-dysfunction syndrome (TMJ) 06/28/2012  . Other abnormal glucose 08/25/2011  . Allergic rhinitis, cause unspecified 08/25/2011  .  Right-sided chest wall pain 08/25/2011  . Murmur 09/20/2010  . Incisional hernia 09/20/2010  . HERNIATED DISC 01/02/2010  . CAROTID ARTERY DISEASE 08/27/2009  . Osteoporosis 06/07/2009  . BRADYCARDIA 02/05/2009  . BENIGN POSITIONAL VERTIGO 09/27/2008  . CELIAC ARTERY COMPRESSION SYNDROME 10/25/2007  . Hypothyroidism 01/08/2007  . Hyperlipidemia 01/08/2007  . Essential hypertension 01/08/2007  . Coronary atherosclerosis 01/08/2007  . PERIPHERAL VASCULAR DISEASE 01/08/2007  . ATROPHIC GASTRITIS 01/08/2007  . ANEMIA, PERNICIOUS, HX OF 01/08/2007  . RENAL CALCULUS, HX OF 01/08/2007  . HYSTERECTOMY, HX OF 01/08/2007  . APPENDECTOMY, HX OF 01/08/2007    Orientation RESPIRATION BLADDER Height & Weight     Self,Time,Situation,Place  Normal Continent Weight: 100 lb 8.5 oz (45.6 kg) Height:  5\' 4"  (162.6 cm)  BEHAVIORAL SYMPTOMS/MOOD NEUROLOGICAL BOWEL NUTRITION STATUS      Incontinent  (see d/c summary)  AMBULATORY STATUS COMMUNICATION OF NEEDS Skin   Limited Assist Verbally PU Stage and Appropriate Care (Stage 1 on coccyx with foam dressing)                       Personal Care Assistance Level of Assistance  Dressing,Feeding,Bathing Bathing Assistance: Limited assistance Feeding assistance: Limited assistance Dressing Assistance: Limited assistance     Functional Limitations Info  Sight,Hearing,Speech Sight Info: Adequate Hearing Info: Adequate Speech Info: Adequate    SPECIAL CARE FACTORS FREQUENCY  PT (By licensed PT),OT (By licensed OT)     PT Frequency: 5x/week OT Frequency: 5x/week            Contractures Contractures Info: Not present    Additional Factors Info  Code Status,Allergies Code Status  Info: Full Allergies Info: Actos (pioglitazone), Sulfonamide Derivatives, Adhesive (tape),   Codeine           Current Medications (07/06/2020):  This is the current hospital active medication list Current Facility-Administered Medications  Medication  Dose Route Frequency Provider Last Rate Last Admin  . acetaminophen (TYLENOL) tablet 1,000 mg  1,000 mg Oral Q8H Griffith, Kelly A, DO   1,000 mg at 07/06/20 1316  . Chlorhexidine Gluconate Cloth 2 % PADS 6 each  6 each Topical Daily Lequita Halt, MD   6 each at 07/06/20 [phone]  . feeding supplement (BOOST / RESOURCE BREEZE) liquid 1 Container  1 Container Oral TID BM Nicole Kindred A, DO   1 Container at 07/06/20 1316  . HYDROmorphone (DILAUDID) injection 0.5 mg  0.5 mg Intravenous Q2H PRN Nicole Kindred A, DO      . hyoscyamine (LEVSIN SL) SL tablet 0.125 mg  0.125 mg Sublingual Q6H PRN Wynetta Fines T, MD      . ipratropium (ATROVENT) 0.06 % nasal spray 1 spray  1 spray Each Nare Q12H Wynetta Fines T, MD   1 spray at 07/06/20 1317  . labetalol (NORMODYNE) tablet 100 mg  100 mg Oral TID PRN Wynetta Fines T, MD      . lactase (LACTAID) tablet 3,000 Units  3,000 Units Oral TID WC PRN Wynetta Fines T, MD      . levothyroxine (SYNTHROID) tablet 50 mcg  50 mcg Oral QHS Wynetta Fines T, MD   50 mcg at 07/05/20 2118  . lidocaine (LIDODERM) 5 % 1 patch  1 patch Transdermal Daily PRN Lequita Halt, MD   1 patch at 07/05/20 1327  . lidocaine (LIDODERM) 5 % 1 patch  1 patch Transdermal Q24H Nicole Kindred A, DO      . multivitamin with minerals tablet 1 tablet  1 tablet Oral Daily Nicole Kindred A, DO   1 tablet at 07/06/20 0845  . oxyCODONE (Oxy IR/ROXICODONE) immediate release tablet 5 mg  5 mg Oral Q4H PRN Nicole Kindred A, DO   5 mg at 07/06/20 0929  . polyethylene glycol (MIRALAX / GLYCOLAX) packet 17 g  17 g Oral Daily Wynetta Fines T, MD   17 g at 07/05/20 0929  . saccharomyces boulardii (FLORASTOR) capsule 250 mg  250 mg Oral Daily Wynetta Fines T, MD   250 mg at 07/06/20 0845  . sodium chloride (OCEAN) 0.65 % nasal spray 1 spray  1 spray Each Nare TID PRN Wynetta Fines T, MD      . sucralfate (CARAFATE) 1 GM/10ML suspension 1 g  1 g Oral BID PRN Wynetta Fines T, MD      . traMADol Veatrice Bourbon) tablet 50 mg  50 mg  Oral BID PRN Lequita Halt, MD   50 mg at 07/06/20 0536     Discharge Medications: Please see discharge summary for a list of discharge medications.  Relevant Imaging Results:  Relevant Lab Results:   Additional Information SS# [id number], [place] COVID-19 Vaccine 04/19/2020 , 05/30/2019 , 05/09/2019  Milinda Antis, LCSWA

## 2020-07-06 NOTE — Progress Notes (Signed)
PROGRESS NOTE  [name] GYF:[id number] DOB: 1933/03/30   PCP: [name], MD  Patient is from: Home.  Lately uses walker for ambulation.  DOA: 07/04/2020 LOS: 2  Chief complaints: Fall at home  Brief Narrative / Interim history: 87 year old F with PMH of chronic back pain, DJD, disc herniation, chronic ambulatory dysfunction, CAD-CABG, chronic A. fib on Eliquis, HLD, aortic valve and mitral valve regurgitation presented to ED after fall at home with possible concern for syncope.  In ED, CT head without acute finding.  CT abdomen and pelvis showed acute L1 compression fracture with approximately 50% height loss and mild retropulsion with moderate narrowing of canal, mild urothelial thickening of left-sided external pelvis and moderate volume stool.  Neurosurgery consulted. MRI lumbar spine showed moderate and progressive T12 and L1 fractures with diffuse bone marrow edema throughout T12 and L1 with retropulsion without no significant spinal stenosis but concern about possible disc space infection.  Patient also had AKI with elevated liver enzymes.  Urinalysis concerning for UTI but no acute bladder habit change.  Completed 3 days of ceftriaxone for possible UTI.  Infectious disease and IR consulted for possible discitis.  She also have some inflammatory markers  Subjective: Seen and examined earlier this morning.  She was just out of the bed with the help of therapy.  She has TLSO brace.  She reports back pain.  She rates her pain 3/10.  She reports bilateral foot numbness but chronic.  She had constipation for which she had an enema.  Otherwise denies new bowel or bladder habit change.  Denies new focal neuro symptoms.  Husband at bedside.  Objective: Vitals:   07/05/20 0948 07/05/20 1156 07/05/20 1949 07/06/20 0403  BP: (!) 110/49 (!) 128/53 (!) 104/50 (!) 119/56  Pulse: 90 92 90 85  Resp: 18 20 20 16   Temp: 98.6 F (37 C) 99.8 F (37.7 C) 98.2 F (36.8 C) 98.6 F (37 C)   TempSrc: Axillary Axillary Oral Oral  SpO2: 93% 91% 94% 95%  Weight:      Height:        Intake/Output Summary (Last 24 hours) at 07/06/2020 1430 Last data filed at 07/06/2020 1258 Gross per 24 hour  Intake 1458.53 ml  Output 400 ml  Net 1058.53 ml   Filed Weights   07/04/20 0620 07/05/20 0200  Weight: 40 kg 45.6 kg    Examination:  GENERAL: No apparent distress.  Nontoxic. HEENT: MMM.  Vision and hearing grossly intact.  NECK: Supple.  No apparent JVD.  RESP: On RA.  No IWOB.  Fair aeration bilaterally. CVS:  RRR. Heart sounds normal.  ABD/GI/GU: BS+. Abd soft, NTND.  MSK/EXT:  Moves extremities.  Significant muscle mass and subcu fat loss SKIN: no apparent skin lesion or wound NEURO: Awake, alert and oriented appropriately.  Motor 4/5 in RLE and 5/5 in LLE.  Patella reflexes symmetric.  Light sensation intact. PSYCH: Calm. Normal affect.  Procedures:  None  Microbiology summarized: [id number] and influenza PCR nonreactive. Blood cultures with pansensitive E. coli except to Bactrim  Assessment & Plan: Fall at home-unknown mechanism but felt lightheaded when she got up to sit on bedside chair which concerns me for orthostatic hypotension.  I do not see orthostatic vitals today.  No event on telemetry yet. -Orthostatic vitals -OOB/PT/OT -Continue fall precautions  Progressive T12-L1 fractures with mild retropulsion and possible discitis.   History of lumbar osteoarthritis and discoordination -Neurosurgery recommended conservative management -Has slightly elevated inflammatory markers. -IR and  ID consulted about possible discitis -Pain control with scheduled Tylenol, lidocaine patch, as needed tramadol, oxycodone and Dilaudid  E. coli UTI? -Completed 3 days of ceftriaxone  AKI with azotemia: Likely prerenal.  Also on Aldactone and losartan at home. Recent Labs    07/25/19 2056 09/22/19 1415 10/27/19 1522 03/21/20 1307 04/30/20 1412 05/23/20 1431  07/04/20 0627 07/05/20 0428 07/06/20 0052  BUN 19 21 19 13 23 27  47* 37* 41*  CREATININE 0.97 1.04* 0.85 0.76 1.02* 1.10* 1.48* 1.25* 1.34*  -Hold ACE inhibitors -Continue IV fluid -Continue monitoring  Acute on chronic transaminitis: Improving.  CK within normal.  She is on amiodarone and a statin which could contribute -Continue holding amiodarone and statin.   Chronic A. fib: Rate controlled.  On amiodarone and Eliquis at home -Eliquis on hold pending possible IR procedure.  Last dose 07/06/2020 -Amiodarone on hold in the setting of LFT -Continue telemetry monitoring  Hyponatremia: Na 130.  Due to AKI?  She was also on Aldactone and losartan at home which could contribute. -Hold Aldactone and losartan -Continue monitoring  Metabolic acidosis: Likely due to IV fluid -Continue monitoring  Chronic systolic CHF: TTE in 27/0350 with LVEF of 45 to 50% and global hypokinesis.  Not on diuretics at home other than Aldactone.  Appears euvolemic. -Monitor fluid status -Hold home losartan and Aldactone.  Severe mitral valve regurgitation Moderate to severe aortic valve regurgitation -Closely monitor fluid status  Normocytic anemia: Dilutional? Recent Labs    07/25/19 2056 09/22/19 1415 10/27/19 1522 03/21/20 1307 04/30/20 1412 05/23/20 1431 07/04/20 0627 07/05/20 0428 07/06/20 0052  HGB 13.2 14.0 13.2 11.5 10.8* 11.4 12.1 12.3 10.0*  -Check anemia panel -Continue monitoring  Hypothyroidism -Continue home Synthroid  Elevated troponin: Likely demand ischemia.  No chest pain. -No indication for further work-up  Essential hypertension: Soft blood pressures without meds. -Continue holding home meds  History of atrophic gastritis -Continue Protonix and Carafate  Peripheral neuropathy -Continue home gabapentin  Mild leukocytosis: Likely demargination but cannot exclude infection.  Improving -Continue monitoring  Underweight/severe malnutrition: As demonstrated by  hypoalbuminemia and significant muscle mass and subcu fat loss Body mass index is 17.26 kg/m. Nutrition Problem: Severe Malnutrition Etiology: chronic illness (chronic lower back pain 2/2 lumbar spine OA) Signs/Symptoms: energy intake < or equal to 75% for > or equal to 1 month,severe fat depletion,severe muscle depletion,percent weight loss Percent weight loss: 6 % Interventions: Ensure Enlive (each supplement provides 350kcal and 20 grams of protein),MVI,Magic cup   Pressure skin injury: POA Pressure Injury 07/05/20 Coccyx Mid;Bilateral Stage 1 -  Intact skin with non-blanchable redness of a localized area usually over a bony prominence. RED/NON BLANCHING (Active)  07/05/20 0352  Location: Coccyx  Location Orientation: Mid;Bilateral  Staging: Stage 1 -  Intact skin with non-blanchable redness of a localized area usually over a bony prominence.  Wound Description (Comments): RED/NON BLANCHING  Present on Admission: Yes   DVT prophylaxis:  SCD Holding Eliquis  Code Status: Full code Family Communication: Updated patient's husband at bedside Level of care: Telemetry Medical Status is: Inpatient  Remains inpatient appropriate because:Ongoing active pain requiring inpatient pain management, Ongoing diagnostic testing needed not appropriate for outpatient work up, Unsafe d/c plan, IV treatments appropriate due to intensity of illness or inability to take PO and Inpatient level of care appropriate due to severity of illness   Dispo: The patient is from: Home              Anticipated d/c is to:  SNF              Patient currently is not medically stable to d/c.   Difficult to place patient No       Consultants:  Neurosurgery Infectious disease Interventional radiology   Sch Meds:  Scheduled Meds: . acetaminophen  1,000 mg Oral Q8H  . Chlorhexidine Gluconate Cloth  6 each Topical Daily  . feeding supplement  1 Container Oral TID BM  . ipratropium  1 spray Each Nare Q12H  .  lactose free nutrition  237 mL Oral TID WC  . levothyroxine  50 mcg Oral QHS  . lidocaine  1 patch Transdermal Q24H  . multivitamin with minerals  1 tablet Oral Daily  . polyethylene glycol  17 g Oral Daily  . saccharomyces boulardii  250 mg Oral Daily   Continuous Infusions: PRN Meds:.HYDROmorphone (DILAUDID) injection, hyoscyamine, labetalol, lactase, lidocaine, oxyCODONE, sodium chloride, sucralfate, traMADol  Antimicrobials: Anti-infectives (From admission, onward)   Start     Dose/Rate Route Frequency Ordered Stop   07/05/20 1030  cefTRIAXone (ROCEPHIN) 1 g in sodium chloride 0.9 % 100 mL IVPB  Status:  Discontinued        1 g 200 mL/hr over 30 Minutes Intravenous Every 24 hours 07/04/20 1326 07/06/20 1133   07/04/20 1030  cefTRIAXone (ROCEPHIN) 1 g in sodium chloride 0.9 % 100 mL IVPB        1 g 200 mL/hr over 30 Minutes Intravenous  Once 07/04/20 1025 07/04/20 1107       I have personally reviewed the following labs and images: CBC: Recent Labs  Lab 07/04/20 0627 07/05/20 0428 07/06/20 0052  WBC 17.6* 14.6* 10.7*  HGB 12.1 12.3 10.0*  HCT 37.0 37.7 29.3*  MCV 84.7 84.2 83.5  PLT 223 209 179   BMP &GFR Recent Labs  Lab 07/04/20 0627 07/05/20 0428 07/06/20 0052  NA 130* 133* 130*  K 4.6 4.6 4.6  CL 98 106 105  CO2 21* 18* 19*  GLUCOSE 114* 94 180*  BUN 47* 37* 41*  CREATININE 1.48* 1.25* 1.34*  CALCIUM 8.7* 7.8* 7.4*   Estimated Creatinine Clearance: 22.1 mL/min (A) (by C-G formula based on SCr of 1.34 mg/dL (H)). Liver & Pancreas: Recent Labs  Lab 07/04/20 0756 07/05/20 0428 07/06/20 0052  AST 286* 350* 260*  ALT 279* 333* 276*  ALKPHOS 81 85 78  BILITOT 1.0 0.9 0.7  PROT 6.8 6.1* 4.8*  ALBUMIN 2.6* 2.3* 1.9*   No results for input(s): LIPASE, AMYLASE in the last 168 hours. No results for input(s): AMMONIA in the last 168 hours. Diabetic: No results for input(s): HGBA1C in the last 72 hours. Recent Labs  Lab 07/04/20 0800  GLUCAP 105*    Cardiac Enzymes: Recent Labs  Lab 07/04/20 1657  CKTOTAL 136   No results for input(s): PROBNP in the last 8760 hours. Coagulation Profile: No results for input(s): INR, PROTIME in the last 168 hours. Thyroid Function Tests: Recent Labs    07/04/20 1657  TSH 5.338*  FREET4 1.80*   Lipid Profile: No results for input(s): CHOL, HDL, LDLCALC, TRIG, CHOLHDL, LDLDIRECT in the last 72 hours. Anemia Panel: Recent Labs    07/04/20 1657  VITAMINB12 1,518*   Urine analysis:    Component Value Date/Time   COLORURINE YELLOW 07/04/2020 0622   APPEARANCEUR HAZY (A) 07/04/2020 0622   LABSPEC 1.014 07/04/2020 0622   PHURINE 5.0 07/04/2020 0622   GLUCOSEU NEGATIVE 07/04/2020 0622   GLUCOSEU NEGATIVE 06/22/2013 1619  HGBUR SMALL (A) 07/04/2020 0622   HGBUR negative 07/30/2009 1606   BILIRUBINUR NEGATIVE 07/04/2020 0622   BILIRUBINUR negative 12/05/2019 1535   KETONESUR NEGATIVE 07/04/2020 0622   PROTEINUR NEGATIVE 07/04/2020 0622   UROBILINOGEN 0.2 12/05/2019 1535   UROBILINOGEN 0.2 06/22/2013 1619   NITRITE NEGATIVE 07/04/2020 0622   LEUKOCYTESUR SMALL (A) 07/04/2020 0622   Sepsis Labs: Invalid input(s): PROCALCITONIN, [place]  Microbiology: Recent Results (from the past 240 hour(s))  Urine culture     Status: Abnormal   Collection Time: 07/04/20  8:09 AM   Specimen: Urine, Random  Result Value Ref Range Status   Specimen Description URINE, RANDOM  Final   Special Requests   Final    NONE Performed at [facility], [address] [place], [place] [address]    Culture >=100,000 COLONIES/mL ESCHERICHIA COLI (A)  Final   Report Status 07/06/2020 FINAL  Final   Organism ID, Bacteria ESCHERICHIA COLI (A)  Final      Susceptibility   Escherichia coli - MIC*    AMPICILLIN 4 SENSITIVE Sensitive     CEFAZOLIN <=4 SENSITIVE Sensitive     CEFEPIME <=0.12 SENSITIVE Sensitive     CEFTRIAXONE <=0.25 SENSITIVE Sensitive     CIPROFLOXACIN <=0.25 SENSITIVE Sensitive      GENTAMICIN <=1 SENSITIVE Sensitive     IMIPENEM <=0.25 SENSITIVE Sensitive     NITROFURANTOIN <=16 SENSITIVE Sensitive     TRIMETH/SULFA >=320 RESISTANT Resistant     AMPICILLIN/SULBACTAM <=2 SENSITIVE Sensitive     PIP/TAZO <=4 SENSITIVE Sensitive     * >=100,000 COLONIES/mL ESCHERICHIA COLI  SARS CORONAVIRUS 2 (TAT 6-24 HRS) Nasopharyngeal Nasopharyngeal Swab     Status: None   Collection Time: 07/04/20 10:21 PM   Specimen: Nasopharyngeal Swab  Result Value Ref Range Status   SARS Coronavirus 2 NEGATIVE NEGATIVE Final    Comment: (NOTE) SARS-CoV-2 target nucleic acids are NOT DETECTED.  The SARS-CoV-2 RNA is generally detectable in upper and lower respiratory specimens during the acute phase of infection. Negative results do not preclude SARS-CoV-2 infection, do not rule out co-infections with other pathogens, and should not be used as the sole basis for treatment or other patient management decisions. Negative results must be combined with clinical observations, patient history, and epidemiological information. The expected result is Negative.  Fact Sheet for Patients: SugarRoll.be  Fact Sheet for Healthcare Providers: https://www.woods-mathews.com/  This test is not yet approved or cleared by the [place] FDA and  has been authorized for detection and/or diagnosis of SARS-CoV-2 by FDA under an Emergency Use Authorization (EUA). This EUA will remain  in effect (meaning this test can be used) for the duration of the COVID-19 declaration under Se ction 564(b)(1) of the Act, 21 U.S.C. section 360bbb-3(b)(1), unless the authorization is terminated or revoked sooner.  Performed at [facility], [place] [address] [place], [place] [address]     Radiology Studies: No results found.    Lam Bjorklund T. [place]  If 7PM-7AM, please contact night-coverage [email or web address] 07/06/2020, 2:30 PM

## 2020-07-06 NOTE — Plan of Care (Signed)
Patient is currently resting in bed. C/o pain, given PRN and scheduled pain medication. Not OOB. Repositioned. VSS. Remains on room air. Patient remains DTV, bladder scanned twice. Patient encourage to drink more fluids, pt has poor appetite throughout the day. Call bell within reach. Bed alarm on.   Problem: Clinical Measurements: Goal: Ability to maintain clinical measurements within normal limits will improve Outcome: Progressing Goal: Will remain free from infection Outcome: Progressing Goal: Diagnostic test results will improve Outcome: Progressing Goal: Respiratory complications will improve Outcome: Progressing Goal: Cardiovascular complication will be avoided Outcome: Progressing   Problem: Activity: Goal: Risk for activity intolerance will decrease Outcome: Progressing   Problem: Nutrition: Goal: Adequate nutrition will be maintained Outcome: Progressing   Problem: Coping: Goal: Level of anxiety will decrease Outcome: Progressing   Problem: Elimination: Goal: Will not experience complications related to bowel motility Outcome: Progressing Goal: Will not experience complications related to urinary retention Outcome: Progressing   Problem: Pain Managment: Goal: General experience of comfort will improve Outcome: Progressing   Problem: Safety: Goal: Ability to remain free from injury will improve Outcome: Progressing   Problem: Skin Integrity: Goal: Risk for impaired skin integrity will decrease Outcome: Progressing

## 2020-07-06 NOTE — Progress Notes (Signed)
Rehab Admissions Coordinator Note:  Patient was screened by Cleatrice Burke for appropriateness for an Inpatient Acute Rehab Consult per therapy recs. At this time, we are recommending Inpatient Rehab consult. I will place order per protocol.  Cleatrice Burke RN MSN 07/06/2020, 8:50 AM  I can be reached at [phone].

## 2020-07-06 NOTE — TOC Initial Note (Signed)
Transition of Care [facility]) - Initial/Assessment Note    Patient Details  Name: [name] MRN: [id number] Date of Birth: 1932/07/03  [facility]) CM/SW Contact:    Milinda Antis, [place] Phone Number: 07/06/2020, 2:26 PM  Clinical Narrative:                 CSW received consult for possible SNF placement at time of discharge. CSW spoke with patient and spouse at bedside. Patient reported that patient's spouse is currently unable to care for patient at their home given patient's current physical needs and fall risk. Patient expressed understanding of PT recommendation and is agreeable to SNF placement at time of discharge if not accepted by CIR. Patient reports preference for [facility]. CSW discussed insurance authorization process. Patient has received the COVID vaccines. Patient expressed being hopeful for rehab and to feel better soon. No further questions reported at this time.   Expected Discharge Plan: IP Rehab Facility Barriers to Discharge: Insurance Authorization,Continued Medical Work up   Patient Goals and CMS Choice Patient states their goals for this hospitalization and ongoing recovery are:: Rehab to return home CMS Medicare.gov Compare Post Acute Care list provided to:: Patient Choice offered to / list presented to : [facility]  Expected Discharge Plan and Services Expected Discharge Plan: [place] In-house Referral: Clinical Social Work   Post Acute Care Choice: IP Rehab Living arrangements for the past 2 months: Single Family Home                                      Prior Living Arrangements/Services Living arrangements for the past 2 months: Single Family Home Lives with:: Spouse Patient language and need for interpreter reviewed:: No Do you feel safe going back to the place where you live?: Yes      Need for Family Participation in Patient Care: Yes (Comment) (Spouse) Care giver support system in place?: Yes (comment)  (Spouse)   Criminal Activity/Legal Involvement Pertinent to Current Situation/Hospitalization: No - Comment as needed  Activities of Daily Living Home Assistive Devices/Equipment: Environmental consultant (specify type) ADL Screening (condition at time of admission) Patient's cognitive ability adequate to safely complete daily activities?: Yes Is the patient deaf or have difficulty hearing?: No Does the patient have difficulty seeing, even when wearing glasses/contacts?: No Does the patient have difficulty concentrating, remembering, or making decisions?: Yes Patient able to express need for assistance with ADLs?: Yes Does the patient have difficulty dressing or bathing?: Yes Independently performs ADLs?: Yes (appropriate for developmental age) Does the patient have difficulty walking or climbing stairs?: Yes Weakness of Legs: Both Weakness of Arms/Hands: None  Permission Sought/Granted Permission sought to share information with : Facility Contact Representative,Family Supports Permission granted to share information with : Yes, Verbal Permission Granted  Share Information with NAME: [name]  Permission granted to share info w AGENCY: SNF  Permission granted to share info w Relationship: Spouse  Permission granted to share info w Contact Information: [phone]  Emotional Assessment Appearance:: Appears stated age Attitude/Demeanor/Rapport: Engaged Affect (typically observed): Accepting,Pleasant Orientation: : Oriented to Place,Oriented to Self,Oriented to  Time,Oriented to Situation Alcohol / Substance Use: Tobacco Use Psych Involvement: No (comment)  Admission diagnosis:  Transaminitis [R74.01] Fall [W19.XXXA] AKI (acute kidney injury) ([place]) [N17.9] Urinary tract infection without hematuria, site unspecified [N39.0] Closed fracture of first lumbar vertebra, unspecified fracture morphology, initial encounter [facility]) [S32.019A] Patient Active  Problem List   Diagnosis Date Noted  .  Pressure injury of skin 07/05/2020  . Protein-calorie malnutrition, severe 07/05/2020  . Acute lower UTI 07/05/2020  . Compression fracture of L1 vertebra ([place]) 07/05/2020  . Fall at home, initial encounter 07/04/2020  . Fall 07/04/2020  . AKI (acute kidney injury) ([place])   . Transaminitis   . Allergic rhinitis due to pollen 06/08/2020  . Occult blood in stools 06/08/2020  . Gastro-esophageal reflux disease without esophagitis 06/08/2020  . Vasomotor rhinitis 06/08/2020  . Wheezing 06/08/2020  . Diarrhea 10/27/2019  . Gastric polyp   . Intervertebral disc rupture 12/27/2018  . Lymphocytic colitis 09/15/2018  . B12 deficiency 06/29/2018  . Persistent atrial fibrillation ([place]) 04/05/2018  . Loose stools 07/06/2017  . Rectal bleeding 07/06/2017  . Generalized abdominal pain 07/06/2017  . Cervical spondylosis 03/11/2017  . Loss of weight 11/18/2013  . Nausea alone 11/18/2013  . Abdominal pain, epigastric 11/18/2013  . Anemia, iron deficiency 01/27/2013  . Temporomandibular joint-pain-dysfunction syndrome (TMJ) 06/28/2012  . Other abnormal glucose 08/25/2011  . Allergic rhinitis, cause unspecified 08/25/2011  . Right-sided chest wall pain 08/25/2011  . Murmur 09/20/2010  . Incisional hernia 09/20/2010  . HERNIATED DISC 01/02/2010  . CAROTID ARTERY DISEASE 08/27/2009  . Osteoporosis 06/07/2009  . BRADYCARDIA 02/05/2009  . BENIGN POSITIONAL VERTIGO 09/27/2008  . CELIAC ARTERY COMPRESSION SYNDROME 10/25/2007  . Hypothyroidism 01/08/2007  . Hyperlipidemia 01/08/2007  . Essential hypertension 01/08/2007  . Coronary atherosclerosis 01/08/2007  . PERIPHERAL VASCULAR DISEASE 01/08/2007  . ATROPHIC GASTRITIS 01/08/2007  . ANEMIA, PERNICIOUS, HX OF 01/08/2007  . RENAL CALCULUS, HX OF 01/08/2007  . HYSTERECTOMY, HX OF 01/08/2007  . APPENDECTOMY, HX OF 01/08/2007   PCP:  [name], MD Pharmacy:   [facility], [place] [address] Phone: [phone] Fax: [phone]  [id number] [place], [place] [address] [facility] [place] [address] Phone: [phone] Fax: [phone]     Social Determinants of Health (SDOH) Interventions    Readmission Risk Interventions No flowsheet data found.

## 2020-07-06 NOTE — Progress Notes (Signed)
Providing Compassionate, Quality Care - Together   Subjective: Patient reports back pain this morning.  Objective: Vital signs in last 24 hours: Temp:  [98.2 F (36.8 C)-99.8 F (37.7 C)] 98.6 F (37 C) (03/11 0403) Pulse Rate:  [85-92] 85 (03/11 0403) Resp:  [16-20] 16 (03/11 0403) BP: (104-128)/(49-56) 119/56 (03/11 0403) SpO2:  [91 %-95 %] 95 % (03/11 0403)  Intake/Output from previous day: 03/10 0701 - 03/11 0700 In: 1218.5 [P.O.:120; I.V.:998.5; IV Piggyback:100] Out: 250 [Urine:250] Intake/Output this shift: No intake/output data recorded.  Alert and oriented x 4 PERRLA CN II-XII grossly intact MAE, Strength intact, decreased sensation in feet  Lab Results: Recent Labs    07/05/20 0428 07/06/20 0052  WBC 14.6* 10.7*  HGB 12.3 10.0*  HCT 37.7 29.3*  PLT 209 179   BMET Recent Labs    07/05/20 0428 07/06/20 0052  NA 133* 130*  K 4.6 4.6  CL 106 105  CO2 18* 19*  GLUCOSE 94 180*  BUN 37* 41*  CREATININE 1.25* 1.34*  CALCIUM 7.8* 7.4*    Studies/Results: MR LUMBAR SPINE WO CONTRAST  Result Date: 07/04/2020 CLINICAL DATA:  Fall at home. EXAM: MRI LUMBAR SPINE WITHOUT CONTRAST TECHNIQUE: Multiplanar, multisequence MR imaging of the lumbar spine was performed. No intravenous contrast was administered. COMPARISON:  CT abdomen pelvis 07/04/2020. MRI lumbar spine 01/26/2020 FINDINGS: Segmentation:  Normal Alignment: 5 mm retrolisthesis T12-L1 which has progressed since the prior MRI. 3 mm anterolisthesis L4-5 unchanged. Vertebrae: Progressive fracture superior endplate of L1, left greater than right. There is associated fracture of the inferior endplate of W73 right greater than left. There is bone marrow edema throughout the T12 and L1 vertebral bodies. There is endplate irregularity which has progressed from the prior MRI. Mild fluid lateral to the T12-L1 disc space on the right. Retropulsion of bone into the canal with mild spinal stenosis. No other fracture  identified. Conus medullaris and cauda equina: Conus extends to the L1-2 level. Conus and cauda equina appear normal. Paraspinal and other soft tissues: Mild paravertebral edema on the right at T12-L1. No fluid collection or epidural abscess. Negative for paravertebral mass. Disc levels: T12-L1: Fracture of T12 and L1 as above with retropulsion of bone into the canal. No significant spinal stenosis. L1-2: Negative L2-3: Mild disc degeneration and mild facet degeneration. No significant spinal stenosis L3-4: Disc degeneration with diffuse endplate spurring and bilateral facet degeneration. Mild to moderate subarticular stenosis bilaterally L4-5: 3 mm anterolisthesis. Diffuse disc bulging. Central and right-sided disc protrusion unchanged. Severe facet degeneration. Moderate to severe spinal stenosis. Severe subarticular and foraminal stenosis on the right. Moderate subarticular stenosis on the left L5-S1: Mild disc and facet degeneration.  Negative for stenosis. IMPRESSION: Moderate fractures of T12 and L1 as noted on CT earlier today. This has progressed since MRI of 01/26/2020. There is diffuse bone marrow edema throughout T12 and L1. There is mild paravertebral edema in the soft tissues on the right. There is retropulsion of bone into the canal without significant spinal stenosis. No epidural abscess however there is concern infection is present in the disc space. This could be an indolent fracture or possibly superimposed infection that has occurred since the MRI of 01/26/2020. Alternatively, it is possible this is a sterile process with progressive mechanical fracture T12-L1 Multilevel degenerative change. Moderate to severe spinal stenosis L4-5 with severe subarticular and foraminal stenosis on the right. This is unchanged from the earlier MRI. Electronically Signed   By: Franchot Gallo M.D.  On: 07/04/2020 14:49   CT ABDOMEN PELVIS W CONTRAST  Result Date: 07/04/2020 CLINICAL DATA:  syncopal episode this  morning and fell in the bathroom. Now having right sided abdominal tenderness EXAM: CT ABDOMEN AND PELVIS WITH CONTRAST TECHNIQUE: Multidetector CT imaging of the abdomen and pelvis was performed using the standard protocol following bolus administration of intravenous contrast. CONTRAST:  83mL OMNIPAQUE IOHEXOL 300 MG/ML  SOLN COMPARISON:  CT abdomen and pelvis January 25, 2019. FINDINGS: Lower chest: Bibasilar atelectasis. Thoracic aortic atherosclerosis. Small hiatal hernia. Hepatobiliary: No suspicious hepatic lesion. Gallbladder surgically absent. Similar prominence of the biliary tree, likely reservoir effect post cholecystectomy. Pancreas: Mild dorsal duct distension which is stable in comparison to prior studies. No focal pancreatic lesion. Spleen: No splenic injury or perisplenic hematoma. Adrenals/Urinary Tract: Bilateral adrenal glands are unremarkable. No hydronephrosis. Mild urothelial thickening at left-sided extrarenal pelvis. Nonobstructive 4 mm left lower pole renal stone. Bilateral low-density renal lesions are unchanged in comparison to prior and likely cysts. Urinary bladder is moderately distended without suspicious wall thickening. Stomach/Bowel: Stomach is predominantly decompressed limiting evaluation. No suspicious small bowel wall thickening or dilation. The appendix is not definitely visualized however there is no inflammatory stranding along the cecum to suggest acute inflammation. Moderate volume of formed stool throughout the colon. Sigmoid colonic diverticulosis without findings of acute diverticulitis. Vascular/Lymphatic: Aortic atherosclerosis. No enlarged abdominal or pelvic lymph nodes. Reproductive: Status post hysterectomy. No adnexal masses. Other: No abdominopelvic ascites. Musculoskeletal: New anterior compression deformity of the L1 vertebral body with approximately 50% height loss and mild retropulsion of bone and disc into the spinal canal with narrowing of the canal.  Global demineralization of bone. Multilevel degenerative changes spine. Degenerative changes bilateral hips. IMPRESSION: 1. New anterior compression deformity of the L1 vertebral body with approximately 50% height loss and mild retropulsion of bone and disc into the spinal canal with moderate narrowing of the canal at this level. 2. Apparent mild urothelial thickening of a left-sided extrarenal pelvis may reflect ascending urinary tract infection. Correlate with urinalysis. 3. Nonobstructive left nephrolithiasis. 4. Sigmoid colonic diverticulosis without findings of acute diverticulitis. 5. Moderate volume of formed stool throughout the colon. 6. Aortic atherosclerosis.  Aortic Atherosclerosis (ICD10-I70.0). Electronically Signed   By: Dahlia Bailiff MD   On: 07/04/2020 10:04    Assessment/Plan: [name] is two days status post fall in her bathroom. She has a history of back pain, but this increased significantly following her fall. CT lumbar spine revealed an L1 compression deformity with mild retropulsion of bone and disc into the spinal canal. Follow-up MRI concerning for osteomyelitis/discitis. Patient with elevated sed rate.   LOS: 2 days    -Continue TLSO when OOB for pain relief. -No Neurosurgical intervention recommended at this time. -Mobilize with therapies. -Patient should follow up with Dr. Annette Stable as an outpatient in 2-3 weeks.  Neurosurgery will sign off at this time. Please re-consult if we can be of further assistance.    Viona Gilmore, DNP, AGNP-C Nurse Practitioner  [facility] [place] [address], [place], [place] [address] P: [phone]    F: [phone]  07/06/2020, 8:59 AM

## 2020-07-06 NOTE — Consult Note (Signed)
Date of Admission:  07/04/2020          Reason for Consult: T12/L1 diskitis    Referring Provider: Dr. Cyndia Skeeters   Assessment:  1. :T12/L1 diskitis likely from hematogenous source possibly from: 2. Odontogenic source given tooth pain 3. Anorexia and weight loss 4. Atrial fibrillation 5. Coronary artery disease 6. Osteoarthritis with chronic back pain 7. Aortic and mitral valve regurgitation  Plan:  1. Stop all antibiotics 2. Ask interventional radiology for aspirate of the disc space for culture (it is not a problem it is not being done today as yield will increase the more time she is off antibiotics 3. CT maxillofacial to assess teeth 4. I then plan on following this with hopefully targeted antibiotics but if not empiric antibiotics that are safe for her 5. Obtain 2D echocardiogram  Principal Problem:   Compression fracture of L1 vertebra (HCC) Active Problems:   Hypothyroidism   Hyperlipidemia   Cervical spondylosis   Persistent atrial fibrillation (HCC)   Fall at home, initial encounter   Fall   AKI (acute kidney injury) ([place])   Transaminitis   Pressure injury of skin   Protein-calorie malnutrition, severe   Acute lower UTI   Scheduled Meds: . acetaminophen  1,000 mg Oral Q8H  . Chlorhexidine Gluconate Cloth  6 each Topical Daily  . feeding supplement  1 Container Oral TID BM  . ipratropium  1 spray Each Nare Q12H  . lactose free nutrition  237 mL Oral TID WC  . levothyroxine  50 mcg Oral QHS  . lidocaine  1 patch Transdermal Q24H  . multivitamin with minerals  1 tablet Oral Daily  . polyethylene glycol  17 g Oral Daily   Continuous Infusions: PRN Meds:.HYDROmorphone (DILAUDID) injection, hyoscyamine, labetalol, lactase, lidocaine, oxyCODONE, sodium chloride, sucralfate, traMADol  HPI: [name] is a 87 y.o. female with past medical history significant for osteoarthritis with chronic pain, coronary artery disease status post coronary artery bypass  grafting valvular heart disease with aortic and atrial valve vegetation, atrial fibrillation on anticoagulation who states that roughly 2 weeks ago she " passed out."  She states that she had been going to the bathroom when she suddenly lost consciousness and fell on her arm.  Apparently she has been having progressive back pain per the history of present illness over the last 4 months.  When I talked to her she was more focused over the last few weeks to month.  She was also experienced anorexia and found that food does not taste good to her much anymore.  She has been losing weight and having increasing difficulty walking.  She states that she again fell on the night of her admission to the hospital again going to use the bathroom and then trying to get something in one of the vanity she said she fell between that and the toilet to the floor.  She was brought to the emergency department where further work-up was undertaken.  Her labs on admission showed a bit of acute kidney injury.  She had imaging done which included [place] x-ray which showed some bibasilar atelectasis and possible pleural effusion plain films of her ankle which were negative CT of the head that showed no acute pathology.  CT of the abdomen pelvis feel the new anterior compression deformity L1 vertebral body with 50% height loss and retropulsion of bone and disc into the spinal canal with moderate narrowing of the canal  There was some urothelial thickening and  left-sided extrarenal side of the pelvis.  She had some nonobstructing nephrolithiasis she had some sigmoid diverticulosis without diverticulitis.  The interim the patient had already been started ceftriaxone so in the context of pyuria.  She herself denies having any burning with urination or nominal pain in the suprapubic area that she has had constipation she also does not have clear-cut flank pain.  She did grow E. coli from the urine which was sensitive to most  antibiotics.  She has received 3 days of ceftriaxone.  MRI of the spine was performed without contrast and showed fractures at T12 and L1.  These were actually present in September 2021 though the patient did not recall this.  On imaging there is now diffuse bone marrow edema in T12 and L1 and paravertebral edema in the soft tissues on the right and retropulsion of bone to the canal.  Your Clark felt that this could represent infection of the disc and vertebral. Bodies though he suggested clinical assessment.   Patient has been seen by Dr. Deri Fuelling from neurosurgery who is not concerned about spinal stenosis but does agree with the concern for discitis vertebral osteomyelitis at T12 and L1.  Labs were done including a sed rate was elevated to 44 compared to a value of 13 in March 2021.  C-reactive protein was also markedly elevated at 17.6 compared to being less than 1 in July 2021.  This patient's indolent course of back pain anorexia weight loss and 2 falls including one syncopal episode suggest that she has some type of underlying subacute process and I worry that she potentially could have had endocarditis or bacteremia and seeded her back.  Certainly she should have the disc space aspirated and sent for culture.  The fact that radiology are not going to likely do it today is not a big problem since she has been on antibiotics and I will ask you be preferable for her to be off antibiotics for 2 days if possible prior to IR guided aspirate.  I would also get CT MF to assess for odontogenic source of infection and get a 2D echocardiogram.  Dr. [place] Bali is on this weekend with questions and will followup on cultures if they are done..   Review of Systems: Review of Systems  Constitutional: Positive for malaise/fatigue and weight loss. Negative for chills and fever.  HENT: Negative for congestion and sore throat.   Eyes: Negative for blurred vision and photophobia.  Respiratory:  Negative for cough, shortness of breath and wheezing.   Cardiovascular: Positive for palpitations. Negative for chest pain and leg swelling.  Gastrointestinal: Positive for heartburn. Negative for abdominal pain, blood in stool, constipation, diarrhea, melena, nausea and vomiting.  Genitourinary: Negative for dysuria, flank pain and hematuria.  Musculoskeletal: Positive for back pain and falls. Negative for joint pain and myalgias.  Skin: Negative for itching and rash.  Neurological: Positive for dizziness and focal weakness. Negative for loss of consciousness, weakness and headaches.  Endo/Heme/Allergies: Does not bruise/bleed easily.  Psychiatric/Behavioral: Negative for depression and suicidal ideas. The patient does not have insomnia.     Past Medical History:  Diagnosis Date  . Allergy   . Anemia, pernicious   . Arthritis    bursitis   . Atrophic gastritis   . B12 deficiency   . CAD (coronary artery disease)   . Carotid artery stenosis   . Cataract   . Diarrhea    on and off for months  . Family history of adverse  reaction to anesthesia    extreme nausea brother and sister  . Fibromyalgia   . Gallbladder polyp   . GERD (gastroesophageal reflux disease)   . Heart murmur   . History of hiatal hernia   . History of kidney stones   . HLD (hyperlipidemia)   . HTN (hypertension)   . Hypothyroidism   . Irritable bowel syndrome (IBS)   . Median arcuate ligament syndrome ([place])   . Osteoporosis   . PVD (peripheral vascular disease) ([place])   . Tubular adenoma of colon 08/2013   with focal high grade dysplasia    Social History   Tobacco Use  . Smoking status: Never Smoker  . Smokeless tobacco: Never Used  Vaping Use  . Vaping Use: Never used  Substance Use Topics  . Alcohol use: No    Alcohol/week: 0.0 standard drinks  . Drug use: No    Family History  Problem Relation Age of Onset  . Heart disease Mother   . Cholelithiasis Mother   . Prostate cancer Brother   .  Colon cancer Brother   . Breast cancer Other        niece - breast cancer  . Diabetes Sister   . Lung cancer Father   . Lung cancer Sister   . Stomach cancer Cousin   . Esophageal cancer Neg Hx   . Rectal cancer Neg Hx    Allergies  Allergen Reactions  . Actos [Pioglitazone] Swelling    Ankle swelling  . Sulfonamide Derivatives Swelling  . Adhesive [Tape] Other (See Comments)    WITH PROLONGED EXPOSURE (REDNESS/ITCHING)  . Codeine Nausea And Vomiting    OBJECTIVE: Blood pressure (!) 119/56, pulse 85, temperature 98.6 F (37 C), temperature source Oral, resp. rate 16, height 5\' 4"  (1.626 m), weight 45.6 kg, SpO2 95 %.  Physical Exam Constitutional:      General: She is not in acute distress.    Appearance: She is not diaphoretic.  HENT:     Head: Normocephalic and atraumatic.     Right Ear: External ear normal.     Left Ear: External ear normal.     Nose: Nose normal.     Mouth/Throat:     Dentition: Abnormal dentition. Dental caries present.     Pharynx: No oropharyngeal exudate.  Eyes:     General: No scleral icterus.    Extraocular Movements: Extraocular movements intact.     Conjunctiva/sclera: Conjunctivae normal.  Cardiovascular:     Rate and Rhythm: Normal rate. Rhythm irregular.     Heart sounds: Murmur heard.    Pulmonary:     Effort: Pulmonary effort is normal. No respiratory distress.     Breath sounds: Normal breath sounds. No stridor. No wheezing, rhonchi or rales.  Abdominal:     General: Bowel sounds are normal. There is no distension.     Palpations: Abdomen is soft.     Tenderness: There is no abdominal tenderness.  Musculoskeletal:        General: No tenderness. Normal range of motion.     Cervical back: Normal range of motion and neck supple.  Lymphadenopathy:     Cervical: No cervical adenopathy.  Skin:    General: Skin is warm and dry.     Coloration: Skin is not pale.     Findings: No erythema or rash.  Neurological:     General: No  focal deficit present.     Mental Status: She is alert and oriented to person, place,  and time.     Coordination: Coordination normal.  Psychiatric:        Mood and Affect: Mood normal.        Behavior: Behavior normal.        Thought Content: Thought content normal.        Judgment: Judgment normal.     Lab Results Lab Results  Component Value Date   WBC 10.7 (H) 07/06/2020   HGB 10.0 (L) 07/06/2020   HCT 29.3 (L) 07/06/2020   MCV 83.5 07/06/2020   PLT 179 07/06/2020    Lab Results  Component Value Date   CREATININE 1.34 (H) 07/06/2020   BUN 41 (H) 07/06/2020   NA 130 (L) 07/06/2020   K 4.6 07/06/2020   CL 105 07/06/2020   CO2 19 (L) 07/06/2020    Lab Results  Component Value Date   ALT 276 (H) 07/06/2020   AST 260 (H) 07/06/2020   ALKPHOS 78 07/06/2020   BILITOT 0.7 07/06/2020     Microbiology: Recent Results (from the past 240 hour(s))  Urine culture     Status: Abnormal   Collection Time: 07/04/20  8:09 AM   Specimen: Urine, Random  Result Value Ref Range Status   Specimen Description URINE, RANDOM  Final   Special Requests   Final    NONE Performed at [facility], [address] [place], [place] [address]    Culture >=100,000 COLONIES/mL ESCHERICHIA COLI (A)  Final   Report Status 07/06/2020 FINAL  Final   Organism ID, Bacteria ESCHERICHIA COLI (A)  Final      Susceptibility   Escherichia coli - MIC*    AMPICILLIN 4 SENSITIVE Sensitive     CEFAZOLIN <=4 SENSITIVE Sensitive     CEFEPIME <=0.12 SENSITIVE Sensitive     CEFTRIAXONE <=0.25 SENSITIVE Sensitive     CIPROFLOXACIN <=0.25 SENSITIVE Sensitive     GENTAMICIN <=1 SENSITIVE Sensitive     IMIPENEM <=0.25 SENSITIVE Sensitive     NITROFURANTOIN <=16 SENSITIVE Sensitive     TRIMETH/SULFA >=320 RESISTANT Resistant     AMPICILLIN/SULBACTAM <=2 SENSITIVE Sensitive     PIP/TAZO <=4 SENSITIVE Sensitive     * >=100,000 COLONIES/mL ESCHERICHIA COLI  SARS CORONAVIRUS 2 (TAT 6-24 HRS) Nasopharyngeal  Nasopharyngeal Swab     Status: None   Collection Time: 07/04/20 10:21 PM   Specimen: Nasopharyngeal Swab  Result Value Ref Range Status   SARS Coronavirus 2 NEGATIVE NEGATIVE Final    Comment: (NOTE) SARS-CoV-2 target nucleic acids are NOT DETECTED.  The SARS-CoV-2 RNA is generally detectable in upper and lower respiratory specimens during the acute phase of infection. Negative results do not preclude SARS-CoV-2 infection, do not rule out co-infections with other pathogens, and should not be used as the sole basis for treatment or other patient management decisions. Negative results must be combined with clinical observations, patient history, and epidemiological information. The expected result is Negative.  Fact Sheet for Patients: SugarRoll.be  Fact Sheet for Healthcare Providers: https://www.woods-mathews.com/  This test is not yet approved or cleared by the [place] FDA and  has been authorized for detection and/or diagnosis of SARS-CoV-2 by FDA under an Emergency Use Authorization (EUA). This EUA will remain  in effect (meaning this test can be used) for the duration of the COVID-19 declaration under Se ction 564(b)(1) of the Act, 21 U.S.C. section 360bbb-3(b)(1), unless the authorization is terminated or revoked sooner.  Performed at [facility], [place] [address], [place], [place] [address]  Alcide Evener, [facility] [phone] pager  07/06/2020, 3:03 PM

## 2020-07-06 NOTE — Progress Notes (Signed)
Physical Therapy Treatment Patient Details Name: [name] MRN: [id number] DOB: [birth year] Today's Date: 07/06/2020    History of Present Illness 87 y.o. female presented to ED 3/9 after fall and question of near syncope. CT lumbar spine reveals L1 acute compression fx. Admitted for treatment of possible syncope, L1 fx, Worsening of chronic transaminases and UTI. Pt reports numbness and tingling in bilateral LE R>L, bladder and bowel incontinence. TLSO for OOB  PMH: chronic back pain secondary to lumbar spine OA, chronic ambulation dysfunction, CAD status post CABG, chronic A. fib on Eliquis, HLD, aortic regurgitation and mitral regurgitation,    PT Comments    RN approached PT in hallway for assist with placement of TLSO for OOB. Pt on bed pan on entry and min A for rolling off for pericare. Pt with much better pain control today and is able to come to the EoB with min A. Once in sitting educated RN, NT and pt in proper donning of TLSO and adjustments for comfort. All involved appreciative of training. Pt then only required contact guard assist for standing and pivoting to recliner at which time physician entered.  D/c plans remain appropriate at this time. PT will continue to progress mobility acutely.     Follow Up Recommendations  CIR     Equipment Recommendations  Wheelchair (measurements PT);Wheelchair cushion (measurements PT)    Recommendations for Other Services Rehab consult     Precautions / Restrictions Precautions Precautions: Fall;Back Precaution Booklet Issued: Yes (comment) Required Braces or Orthoses: Spinal Brace Spinal Brace: Thoracolumbosacral orthotic Restrictions Weight Bearing Restrictions: No    Mobility  Bed Mobility Overal bed mobility: Needs Assistance Bed Mobility: Rolling;Sidelying to Sit Rolling: Min assist Sidelying to sit: Min assist       General bed mobility comments: min A for rolling off of bed pan, pt with good LE management and  trunk to upright, minimal assist to bring hips to EoB with pad    Transfers Overall transfer level: Needs assistance   Transfers: Sit to/from Stand;Stand Pivot Transfers Sit to Stand: Min assist Stand pivot transfers: Min assist       General transfer comment: very light min A for power up and stepping pivot to recliner. MD in room so ambulation deferred         Balance Overall balance assessment: Needs assistance;History of Falls Sitting-balance support: Feet supported;Single extremity supported;Bilateral upper extremity supported Sitting balance-Leahy Scale: Fair     Standing balance support: Bilateral upper extremity supported;During functional activity Standing balance-Leahy Scale: Poor Standing balance comment: reliant on UE support                            Cognition Arousal/Alertness: Awake/alert Behavior During Therapy: WFL for tasks assessed/performed;Anxious Overall Cognitive Status: Within Functional Limits for tasks assessed                                 General Comments: much less anxious and able to demonstrate back adherence to precautions      Exercises      General Comments General comments (skin integrity, edema, etc.): VSS on RA, provided TLSO education for RN and Tech so she can get up to chair over the weekend, husband present throughout session      Pertinent Vitals/Pain Pain Assessment: Faces Faces Pain Scale: Hurts a little bit Pain Location: back with movement Pain Descriptors /  Indicators: Discomfort;Grimacing;Moaning Pain Intervention(s): Limited activity within patient's tolerance;Monitored during session;Repositioned    Home Living Family/patient expects to be discharged to:: Private residence Living Arrangements: Spouse/significant other Available Help at Discharge: Family;Available PRN/intermittently (husband works 7-12) Type of Home: House Home Access: Stairs to enter Entrance Stairs-Rails:  None Home Layout: Two level;Able to live on main level with bedroom/bathroom Home Equipment: Shower seat;Walker - 2 wheels;Walker - standard      Prior Function Level of Independence: Needs assistance  Gait / Transfers Assistance Needed: uses RW for household ambulation recently ADL's / Homemaking Assistance Needed: does birdbaths when husband is not around, in shower when he is, unable clean, tries to assist with laundry     PT Goals (current goals can now be found in the care plan section) Acute Rehab PT Goals Patient Stated Goal: be able to do for myself, get stronger PT Goal Formulation: With patient/family Time For Goal Achievement: 07/19/20 Potential to Achieve Goals: Fair Progress towards PT goals: Progressing toward goals    Frequency    Min 3X/week      PT Plan Current plan remains appropriate       AM-PAC PT "6 Clicks" Mobility   Outcome Measure  Help needed turning from your back to your side while in a flat bed without using bedrails?: A Little Help needed moving from lying on your back to sitting on the side of a flat bed without using bedrails?: A Lot Help needed moving to and from a bed to a chair (including a wheelchair)?: A Little Help needed standing up from a chair using your arms (e.g., wheelchair or bedside chair)?: A Little Help needed to walk in hospital room?: A Lot Help needed climbing 3-5 steps with a railing? : Total 6 Click Score: 14    End of Session Equipment Utilized During Treatment: Gait belt;Back brace Activity Tolerance: Other (comment) Patient left: in chair;with call bell/phone within reach;with nursing/sitter in room;Other (comment) (physician in room) Nurse Communication: Mobility status;Other (comment) (TLSO donning and doffing) PT Visit Diagnosis: Unsteadiness on feet (R26.81);Other abnormalities of gait and mobility (R26.89);Muscle weakness (generalized) (M62.81);Difficulty in walking, not elsewhere classified (R26.2);Other  symptoms and signs involving the nervous system (R29.898);Pain     Time: 6812-7517 PT Time Calculation (min) (ACUTE ONLY): 13 min  Charges:  $[employer] Fit Training: 8-22 mins                     Aadvik Roker B. Migdalia Dk PT, DPT Acute Rehabilitation Services Pager [phone] Office [phone]    [place] 07/06/2020, 12:29 PM

## 2020-07-06 NOTE — Progress Notes (Signed)
Nutrition Follow-up  DOCUMENTATION CODES:   Severe malnutrition in context of chronic illness  INTERVENTION:  Provide Boost Breeze po TID, each supplement provides 250 kcal and 9 grams of protein.  Provide Boost Plus po TID, each supplement provides 360 kcal and 14 grams of protein.  Encourage adequate PO intake.  NUTRITION DIAGNOSIS:   Severe Malnutrition related to chronic illness (chronic lower back pain 2/2 lumbar spine OA) as evidenced by energy intake < or equal to 75% for > or equal to 1 month,severe fat depletion,severe muscle depletion,percent weight loss; ongoing  GOAL:   Patient will meet greater than or equal to 90% of their needs; progressing  MONITOR:   PO intake,Supplement acceptance,Diet advancement  REASON FOR ASSESSMENT:   Malnutrition Screening Tool    ASSESSMENT:   87 yo female with PMH of chronic back pain 2/2 lumbar spine OA, chronic ambulation dysfunction, CAD s/p CABG, chronic A-fib, HLD, aortic and mitral regurgitation, HTN, IBS, and GERD who presents with a fall, AKI, and UTI.  Meal completion has been 25-30%. Pt reports having a decreased appetite and altered taste. Pt requests Boost Plus supplementation shakes. RD to order to aid in caloric and protein needs. Pt encouraged to eat her food at meals and to drink her nutritional supplement shakes.   Labs and medications reviewed.   Diet Order:   Diet Order            Diet Heart Room service appropriate? Yes; Fluid consistency: Thin  Diet effective now                 EDUCATION NEEDS:   Education needs have been addressed  Skin:  Skin Assessment: Skin Integrity Issues: Skin Integrity Issues:: Stage I Stage I: coccyx  Last BM:  3/11  Height:   Ht Readings from Last 1 Encounters:  07/05/20 5\' 4"  (1.626 m)    Weight:   Wt Readings from Last 1 Encounters:  07/05/20 45.6 kg    Ideal Body Weight:  54.5 kg  BMI:  Body mass index is 17.26 kg/m.  Estimated Nutritional Needs:    Kcal:  1500-1700  Protein:  80-95 grams  Fluid:  >1.5 L  Corrin Parker, MS, RD, LDN RD pager number/after hours weekend pager number on Amion.

## 2020-07-06 NOTE — Progress Notes (Signed)
IR consulted by Dr. Cyndia Skeeters for possible image-guided disc aspiration.  Case/images have been reviewed by Dr. Estanislado Pandy who recommends formal ID consult prior to approving possible procedure. Dr. Cyndia Skeeters made aware.  IR to follow.   Bea Graff Louk, PA-C 07/06/2020, 2:24 PM

## 2020-07-07 ENCOUNTER — Inpatient Hospital Stay (HOSPITAL_COMMUNITY): Payer: Medicare Other

## 2020-07-07 DIAGNOSIS — I358 Other nonrheumatic aortic valve disorders: Secondary | ICD-10-CM

## 2020-07-07 DIAGNOSIS — I351 Nonrheumatic aortic (valve) insufficiency: Secondary | ICD-10-CM

## 2020-07-07 DIAGNOSIS — I361 Nonrheumatic tricuspid (valve) insufficiency: Secondary | ICD-10-CM

## 2020-07-07 DIAGNOSIS — I339 Acute and subacute endocarditis, unspecified: Secondary | ICD-10-CM

## 2020-07-07 DIAGNOSIS — R5381 Other malaise: Secondary | ICD-10-CM

## 2020-07-07 DIAGNOSIS — R931 Abnormal findings on diagnostic imaging of heart and coronary circulation: Secondary | ICD-10-CM

## 2020-07-07 DIAGNOSIS — I340 Nonrheumatic mitral (valve) insufficiency: Secondary | ICD-10-CM

## 2020-07-07 LAB — COMPREHENSIVE METABOLIC PANEL
ALT: 208 U/L — ABNORMAL HIGH (ref 0–44)
AST: 139 U/L — ABNORMAL HIGH (ref 15–41)
Albumin: 2 g/dL — ABNORMAL LOW (ref 3.5–5.0)
Alkaline Phosphatase: 81 U/L (ref 38–126)
Anion gap: 4 — ABNORMAL LOW (ref 5–15)
BUN: 39 mg/dL — ABNORMAL HIGH (ref 8–23)
CO2: 22 mmol/L (ref 22–32)
Calcium: 7.9 mg/dL — ABNORMAL LOW (ref 8.9–10.3)
Chloride: 103 mmol/L (ref 98–111)
Creatinine, Ser: 1.31 mg/dL — ABNORMAL HIGH (ref 0.44–1.00)
GFR, Estimated: 40 mL/min — ABNORMAL LOW (ref >60)
Glucose, Bld: 99 mg/dL (ref 70–99)
Potassium: 5.1 mmol/L (ref 3.5–5.1)
Sodium: 129 mmol/L — ABNORMAL LOW (ref 135–145)
Total Bilirubin: 0.9 mg/dL (ref 0.3–1.2)
Total Protein: 5 g/dL — ABNORMAL LOW (ref 6.5–8.1)

## 2020-07-07 LAB — ECHOCARDIOGRAM COMPLETE
AR max vel: 1.13 cm2
AV Area VTI: 0.84 cm2
AV Area mean vel: 0.95 cm2
AV Mean grad: 9 mmHg
AV Peak grad: 17 mmHg
Ao pk vel: 2.06 m/s
Height: 64 in
MV M vel: 4.65 m/s
MV Peak grad: 86.5 mmHg
P 1/2 time: 475 msec
Radius: 0.6 cm
S' Lateral: 2.7 cm
Weight: 1608.48 oz

## 2020-07-07 LAB — CBC
HCT: 32.3 % — ABNORMAL LOW (ref 36.0–46.0)
Hemoglobin: 11 g/dL — ABNORMAL LOW (ref 12.0–15.0)
MCH: 28.1 pg (ref 26.0–34.0)
MCHC: 34.1 g/dL (ref 30.0–36.0)
MCV: 82.4 fL (ref 80.0–100.0)
Platelets: 153 10*3/uL (ref 150–400)
RBC: 3.92 MIL/uL (ref 3.87–5.11)
RDW: 15.8 % — ABNORMAL HIGH (ref 11.5–15.5)
WBC: 9.9 10*3/uL (ref 4.0–10.5)
nRBC: 0 % (ref 0.0–0.2)

## 2020-07-07 LAB — PHOSPHORUS: Phosphorus: 2.8 mg/dL (ref 2.5–4.6)

## 2020-07-07 LAB — PROTIME-INR
INR: 1.7 — ABNORMAL HIGH (ref 0.8–1.2)
Prothrombin Time: 19.3 seconds — ABNORMAL HIGH (ref 11.4–15.2)

## 2020-07-07 LAB — CORTISOL-AM, BLOOD: Cortisol - AM: 23.2 ug/dL — ABNORMAL HIGH (ref 6.7–22.6)

## 2020-07-07 LAB — TSH: TSH: 11.642 u[IU]/mL — ABNORMAL HIGH (ref 0.350–4.500)

## 2020-07-07 LAB — MAGNESIUM: Magnesium: 2 mg/dL (ref 1.7–2.4)

## 2020-07-07 MED ORDER — GADOBUTROL 1 MMOL/ML IV SOLN
5.0000 mL | Freq: Once | INTRAVENOUS | Status: AC | PRN
  Administered 2020-07-07: 5 mL via INTRAVENOUS

## 2020-07-07 MED ORDER — SODIUM CHLORIDE 0.9 % IV SOLN
2.0000 g | Freq: Every day | INTRAVENOUS | Status: DC
  Administered 2020-07-07: 2 g via INTRAVENOUS
  Filled 2020-07-07: qty 20

## 2020-07-07 MED ORDER — SODIUM CHLORIDE 0.9 % IV SOLN
8.0000 mg/kg | INTRAVENOUS | Status: DC
  Administered 2020-07-07: 365 mg via INTRAVENOUS
  Filled 2020-07-07: qty 7.3

## 2020-07-07 MED ORDER — METOPROLOL TARTRATE 12.5 MG HALF TABLET
12.5000 mg | ORAL_TABLET | Freq: Two times a day (BID) | ORAL | Status: DC
  Administered 2020-07-07 – 2020-07-12 (×10): 12.5 mg via ORAL
  Filled 2020-07-07 (×10): qty 1

## 2020-07-07 MED ORDER — HEPARIN BOLUS VIA INFUSION
1200.0000 [IU] | Freq: Once | INTRAVENOUS | Status: DC
  Filled 2020-07-07: qty 1200

## 2020-07-07 MED ORDER — HEPARIN (PORCINE) 25000 UT/250ML-% IV SOLN
650.0000 [IU]/h | INTRAVENOUS | Status: DC
  Filled 2020-07-07: qty 250

## 2020-07-07 NOTE — Progress Notes (Signed)
ID Brief Note   TTE 07/06/20 1. Left ventricular ejection fraction, by estimation, is 40 to 45%. The left ventricle has mildly decreased function. The left ventricle demonstrates regional wall motion abnormalities. Abnormal (paradoxical) septal motion, consistent with left bundle branch block There is mild left ventricular hypertrophy. Left ventricular diastolic parameters are indeterminate. 2. Right ventricular systolic function is mildly reduced. The right ventricular size is normal. There is moderately elevated pulmonary artery systolic pressure. The estimated right ventricular systolic pressure is 45.8 mmHg. 3. Left atrial size was moderately dilated. 4. Right atrial size was mildly dilated. 5. The mitral valve is abnormal. Moderate to severe mitral valve regurgitation. 6. There is an aortic valve vegetation measuring 1.5cm x 0.5cm. The aortic valve is tricuspid. No aortic stenosis is present. Increased gradients through AV likely due to AI, visually does appear stenotic. Aortic valve regurgitation is moderate to severe.   I did not see any results of TEE performed on 04/03/20. However, 02/16/20 TTE did not mention any AV vegetations.   Recommendations I will order 2 sets of blood cultures now as patient is off antibiotics currently. Please start Daptomycin and ceftriaxone after blood cultures done asap Consult ST Sx given AV vegetation and severe AVR and MVR Will defer need of TEE to [facility]ious Disease [place]

## 2020-07-07 NOTE — Progress Notes (Addendum)
ANTICOAGULATION CONSULT NOTE - Initial Consult  Pharmacy Consult for Heparin Indication: atrial fibrillation while apixaban on hold   Allergies  Allergen Reactions  . Actos [Pioglitazone] Swelling    Ankle swelling  . Sulfonamide Derivatives Swelling  . Adhesive [Tape] Other (See Comments)    WITH PROLONGED EXPOSURE (REDNESS/ITCHING)  . Codeine Nausea And Vomiting    Patient Measurements: Height: 5\' 4"  (162.6 cm) Weight: 45.6 kg (100 lb 8.5 oz) IBW/kg (Calculated) : 54.7 Heparin Dosing Weight: 45.6 kg   Vital Signs: Temp: 98.5 F (36.9 C) (03/12 0425) Temp Source: Oral (03/12 0425) BP: 103/50 (03/12 0425) Pulse Rate: 97 (03/12 0425)  Labs: Recent Labs    07/04/20 1657 07/05/20 0428 07/05/20 0428 07/06/20 0052 07/07/20 0358  HGB  --  12.3   < > 10.0* 11.0*  HCT  --  37.7  --  29.3* 32.3*  PLT  --  209  --  179 153  LABPROT  --   --   --   --  19.3*  INR  --   --   --   --  1.7*  CREATININE  --  1.25*  --  1.34* 1.31*  CKTOTAL 136  --   --   --   --    < > = values in this interval not displayed.    Estimated Creatinine Clearance: 22.6 mL/min (A) (by C-G formula based on SCr of 1.31 mg/dL (H)).   Medical History: Past Medical History:  Diagnosis Date  . Allergy   . Anemia, pernicious   . Arthritis    bursitis   . Atrophic gastritis   . B12 deficiency   . CAD (coronary artery disease)   . Carotid artery stenosis   . Cataract   . Diarrhea    on and off for months  . Family history of adverse reaction to anesthesia    extreme nausea brother and sister  . Fibromyalgia   . Gallbladder polyp   . GERD (gastroesophageal reflux disease)   . Heart murmur   . History of hiatal hernia   . History of kidney stones   . HLD (hyperlipidemia)   . HTN (hypertension)   . Hypothyroidism   . Irritable bowel syndrome (IBS)   . Median arcuate ligament syndrome ([place])   . Osteoporosis   . PVD (peripheral vascular disease) ([place])   . Tubular adenoma of colon 08/2013    with focal high grade dysplasia    Assessment: 87 yo female presented on 07/04/2020 with a fall at home found to have progressive T12-L1 fractures and possible discitis. TTE found aortic valve vegetation and patient on daptomycin and ceftriaxone. CVTS consulted. Patient is on apixaban PTA for Afib which is being held. Last dose 07/06/2020 AM. Pharmacy consulted to dose heparin while apixaban on hold. Hgb 11.0. Plt 153.   Goal of Therapy:  Heparin level 0.3-0.7 units/ml aPTT 66-102 seconds Monitor platelets by anticoagulation protocol: Yes   Plan:  Heparin 1200 unit bolus x1  Start heparin 650 units/hr  Check 8 hr aPTT and heparin level Monitor by aPTT until heparin level and aPTT correlating due to previous apixaban use falsely elevating heparin level Monitor aPTT, heparin level, CBC and s/s of bleeding daily  Follow up surgery plans and resuming apixaban.   Cristela Felt, PharmD Clinical Pharmacist   07/07/2020,2:12 PM

## 2020-07-07 NOTE — Consult Note (Signed)
Cardiology Consultation:  Patient ID: [name] MRN: [id number]; DOB: 1933-02-08  Admit date: 07/04/2020 Date of Consult: 07/07/2020  Primary Care Provider: [name], MD Primary Cardiologist: Sherren Mocha, MD  Primary Electrophysiologist:  None   Patient Profile:  [name] is a 87 y.o. female with a hx of CAD status post CABG, severe mitral regurgitation, heart failure with reduced ejection fraction, atrial fibrillation, hypertension who is being seen today for the evaluation of aortic valve endocarditis at the request of Wendee Beavers, MD.  History of Present Illness:  [name] presents with fall from home.  She informs me that she has felt weak for several days.  She reports poor balance as well.  She apparently had a fall when getting up to go to the bathroom.  There was concern for syncope.  Work-up so far has demonstrated concern for compression fracture as well as T12 and L1 fractures with diffuse bone marrow edema concerning for possible discitis.  She is also been found to have elevated liver enzymes and AKI.  An echocardiogram was obtained as part of work-up which shows a well-known history of severe mitral vegetation.  She also has a large aortic valve vegetation.  Cardiology was consulted for further recommendations.  Her lab work shows a sodium of 129, creatinine 1.31.  AST 139 ALT 208.  WBC 9.9.  Hemoglobin 11.0.  She does have an abnormal TSH of 11.6.  Further evaluation of this per hospital medicine.  Blood cultures are pending.  Chest x-ray shows no evidence of effusion.  CT head negative for stroke.  Her echocardiogram demonstrates her well-known left bundle branch block.  She is now in atrial fibrillation.  EF 40-45%.  She only has wall motion abnormalities consistent with her left bundle branch block.  There are no new wall motion abnormalities.  She also has a large up to 1 cm vegetation on the aortic valve with severe aortic insufficiency.  She did have a  transesophageal echocardiogram on 04/03/2020 that demonstrated severe mitral regurgitation as well as moderate severe aortic insufficiency.  She also had moderate tricuspid regurgitation. She was evaluated by cardiac surgery this afternoon.  Due to her frailty and debility she is not a candidate for surgical intervention of her endocarditis. Despite aortic valve endocarditis she reports no symptoms of chest pain or shortness of breath.  She has no lower extremity edema.  She has no symptoms of congestive heart failure.  She just reports she feels weak and has back pain.  Heart Pathway Score:       Past Medical History: Past Medical History:  Diagnosis Date  . Allergy   . Anemia, pernicious   . Arthritis    bursitis   . Atrophic gastritis   . B12 deficiency   . CAD (coronary artery disease)   . Carotid artery stenosis   . Cataract   . Diarrhea    on and off for months  . Family history of adverse reaction to anesthesia    extreme nausea brother and sister  . Fibromyalgia   . Gallbladder polyp   . GERD (gastroesophageal reflux disease)   . Heart murmur   . History of hiatal hernia   . History of kidney stones   . HLD (hyperlipidemia)   . HTN (hypertension)   . Hypothyroidism   . Irritable bowel syndrome (IBS)   . Median arcuate ligament syndrome ([place])   . Osteoporosis   . PVD (peripheral vascular disease) ([place])   .  Tubular adenoma of colon 08/2013   with focal high grade dysplasia    Past Surgical History: Past Surgical History:  Procedure Laterality Date  . ABDOMINAL HYSTERECTOMY    . APPENDECTOMY    . BIOPSY THYROID    . CARDIOVERSION N/A 04/03/2020   Procedure: CARDIOVERSION;  Surgeon: Dorothy Spark, MD;  Location: [facility] ENDOSCOPY;  Service: Cardiovascular;  Laterality: N/A;  . cataract surgery    . CHOLECYSTECTOMY N/A 10/24/2014   Procedure: MICROSCOPIC  CHOLECYSTECTOMY;  Surgeon: Ralene Ok, MD;  [facility] ;  Service: General;  Laterality: N/A;  .  COLONOSCOPY    . CORONARY ARTERY BYPASS GRAFT  07  . ESOPHAGEAL MANOMETRY N/A 08/02/2012   Procedure: ESOPHAGEAL MANOMETRY (EM);  Surgeon: Sable Feil, MD;  Location: [facility] ENDOSCOPY;  Service: Endoscopy;  Laterality: N/A;  . ESOPHAGOGASTRODUODENOSCOPY (EGD) WITH PROPOFOL N/A 04/07/2019   Procedure: ESOPHAGOGASTRODUODENOSCOPY (EGD) WITH PROPOFOL;  Surgeon: Milus Banister, MD;  Location: [facility] ENDOSCOPY;  Service: Endoscopy;  Laterality: N/A;  . HEMOSTASIS CLIP PLACEMENT  04/07/2019   Procedure: HEMOSTASIS CLIP PLACEMENT;  Surgeon: Milus Banister, MD;  Location: [facility] ENDOSCOPY;  Service: Endoscopy;;  . HOT HEMOSTASIS N/A 04/07/2019   Procedure: HOT HEMOSTASIS (ARGON PLASMA COAGULATION/BICAP);  Surgeon: Milus Banister, MD;  Location: [facility] ENDOSCOPY;  Service: Endoscopy;  Laterality: N/A;  . POLYPECTOMY  04/07/2019   Procedure: POLYPECTOMY;  Surgeon: Milus Banister, MD;  Location: [facility] ENDOSCOPY;  Service: Endoscopy;;  . [name]  04/07/2019   Procedure: [name];  Surgeon: Milus Banister, MD;  Location: [facility] ENDOSCOPY;  Service: Endoscopy;;  . TEE WITHOUT CARDIOVERSION N/A 04/03/2020   Procedure: TRANSESOPHAGEAL ECHOCARDIOGRAM (TEE);  Surgeon: Dorothy Spark, MD;  Location: [facility] ENDOSCOPY;  Service: Cardiovascular;  Laterality: N/A;  . THYROID LOBECTOMY    . UPPER GASTROINTESTINAL ENDOSCOPY       Home Medications:  Prior to Admission medications   Medication Sig Start Date End Date Taking? Authorizing Provider  acetaminophen (TYLENOL) 500 MG tablet Take 500 mg by mouth as needed for mild pain.   Yes [provider]  amiodarone (PACERONE) 100 MG tablet Take 1 tablet (100 mg total) by mouth daily. 06/20/20 06/15/21 Yes Sherren Mocha, MD  atorvastatin (LIPITOR) 80 MG tablet Take 0.5 tablets (40 mg total) by mouth at bedtime. 05/29/20  Yes Burtis Junes, NP  Calcium Carbonate-Vitamin D (CALTRATE 600+D PO) Take 1 tablet by mouth daily with lunch.    Yes [provider]  cyanocobalamin (,VITAMIN B-12,) 1000 MCG/ML injection Inject 1,000 mcg into the muscle every 30 (thirty) days.   Yes [provider]  ELIQUIS 2.5 MG TABS tablet TAKE 1 TABLET BY MOUTH TWICE DAILY Patient taking differently: Take 2.5 mg by mouth 2 (two) times daily. 06/28/20  Yes Burchette, Alinda Sierras, MD  gabapentin (NEURONTIN) 100 MG capsule Take 1 capsule (100 mg total) by mouth 2 (two) times daily. Patient taking differently: Take 100 mg by mouth as needed (pain). 01/24/20  Yes Burchette, Alinda Sierras, MD  hyoscyamine (LEVSIN SL) 0.125 MG SL tablet Place 1 tablet (0.125 mg total) under the tongue every 6 (six) hours as needed. Patient taking differently: Place 0.125 mg under the tongue as needed for cramping. 05/23/19  Yes Burchette, Alinda Sierras, MD  ipratropium (ATROVENT) 0.03 % nasal spray Place 2 sprays into both nostrils every 12 (twelve) hours.   Yes [provider]  lactase (LACTAID) 3000 units tablet Take 3,000 Units by mouth 3 (three) times daily with meals as  needed (lactose Intolerance).    Yes [provider]  levothyroxine (SYNTHROID, LEVOTHROID) 50 MCG tablet Take 50 mcg by mouth at bedtime.   Yes [provider]  Lidocaine 4 % OINT Apply 1 application topically 3 (three) times daily as needed (pain). Roll-On (otc)   Yes [provider]  Lidocaine 4 % PTCH Place 1 patch onto the skin daily as needed (pain.).    Yes [provider]  losartan (COZAAR) 100 MG tablet Take 1 tablet (100 mg total) by mouth daily. 05/17/20  Yes Burchette, Alinda Sierras, MD  Multiple Vitamins-Minerals (PRESERVISION AREDS 2 PO) Take 1 tablet by mouth 2 (two) times daily.   Yes [provider]  Polyethyl Glycol-Propyl Glycol (SYSTANE FREE OP) Place 1 drop into both eyes 3 (three) times daily as needed (dry/irritated eyes.).   Yes [provider]  saccharomyces boulardii (FLORASTOR) 250 MG capsule Take 250 mg by mouth as needed (digestion).   Yes [provider]  sodium chloride (OCEAN) 0.65 % SOLN nasal spray Place 1 spray into both nostrils as needed for congestion.   Yes [provider]  spironolactone (ALDACTONE) 25 MG tablet TAKE 1 TABLET BY MOUTH ONCE DAILY Patient taking differently: Take 25 mg by mouth daily. 06/28/20  Yes Burchette, Alinda Sierras, MD  sucralfate (CARAFATE) 1 GM/10ML suspension TAKE 10 MLS (1 G TOTAL) BY MOUTH 2 (TWO) TIMES DAILY. Patient taking differently: Take 1 g by mouth as needed (reflux). 08/23/19  Yes Burchette, Alinda Sierras, MD  traMADol (ULTRAM) 50 MG tablet Take 50 mg by mouth as needed for moderate pain or severe pain.   Yes [provider]    Inpatient Medications: Scheduled Meds: . acetaminophen  1,000 mg Oral Q8H  . Chlorhexidine Gluconate Cloth  6 each Topical Daily  . feeding supplement  1 Container Oral TID BM  . heparin  1,200 Units Intravenous Once  . ipratropium  1 spray Each Nare Q12H  . lactose free nutrition  237 mL Oral TID WC  . levothyroxine  50 mcg Oral QHS  . lidocaine  1 patch Transdermal Q24H  . multivitamin with minerals  1 tablet Oral Daily  . polyethylene glycol  17 g Oral Daily   Continuous Infusions: . cefTRIAXone (ROCEPHIN)  IV 2 g (07/07/20 0923)  . DAPTOmycin (CUBICIN)  IV 365 mg (07/07/20 1136)  . heparin     PRN Meds: HYDROmorphone (DILAUDID) injection, hyoscyamine, labetalol, lactase, lidocaine, oxyCODONE, sodium chloride, sucralfate, traMADol  Allergies:    Allergies  Allergen Reactions  . Actos [Pioglitazone] Swelling    Ankle swelling  . Sulfonamide Derivatives Swelling  . Adhesive [Tape] Other (See Comments)    WITH PROLONGED EXPOSURE (REDNESS/ITCHING)  . Codeine Nausea And Vomiting    Social History:   Social History   Socioeconomic History  . Marital status: Married    Spouse name: [name]  . Number of children: 0  . Years of education: 17  . Highest education level: Not on file  Occupational History  . Occupation: retired     Fish farm manager: RETIRED  Tobacco Use  . Smoking status: Never Smoker  . Smokeless tobacco: Never Used  Vaping Use  . Vaping Use: Never used  Substance and Sexual Activity  . Alcohol use: No    Alcohol/week: 0.0 standard drinks  . Drug use: No  . Sexual activity: Not on file  Other Topics Concern  . Not on file  Social History Narrative   [name], Married. Lives with husband. Had  been a very active woman.   Social Determinants of Health   Financial Resource Strain: Low Risk   . Difficulty of Paying Living Expenses: Not hard at all  Food Insecurity: Not on file  Transportation Needs: No Transportation Needs  . Lack of Transportation (Medical): No  . Lack of Transportation (Non-Medical): No  Physical Activity: Not on file  Stress: Not on file  Social Connections: Not on file  Intimate Partner Violence: Not on file     Family History:    Family History  Problem Relation Age of Onset  . Heart disease Mother   . Cholelithiasis Mother   . Prostate cancer Brother   . Colon cancer Brother   . Breast cancer Other        niece - breast cancer  . Diabetes Sister   . Lung cancer Father   . Lung cancer Sister   . Stomach cancer Cousin   . Esophageal cancer Neg Hx   . Rectal cancer Neg Hx      ROS:  All other ROS reviewed and negative. Pertinent positives noted in the HPI.     Physical Exam/Data:   Vitals:   07/06/20 2030 07/06/20 2040 07/07/20 0425 07/07/20 1419  BP: 98/66  (!) 103/50 (!) 123/57  Pulse: (!) 101 98 97 99  Resp: 20 20 19 18   Temp: 98.1 F (36.7 C)  98.5 F (36.9 C) 97.6 F (36.4 C)  TempSrc: Oral  Oral Oral  SpO2: 96% 95% 95% 100%  Weight:      Height:         Intake/Output Summary (Last 24 hours) at 07/07/2020 1523 Last data filed at 07/07/2020 0600 Gross per 24 hour  Intake --  Output 400 ml  Net -400 ml    Last 3 Weights 07/05/2020 07/04/2020 06/20/2020  Weight (lbs) 100 lb 8.5 oz 88 lb 2.9 oz 88 lb 3.2 oz  Weight (kg) 45.6 kg 40 kg 40.007 kg    Body  mass index is 17.26 kg/m.   General: Well nourished, well developed, in no acute distress Head: Atraumatic, normal size  Eyes: PEERLA, EOMI  Neck: Supple, JVD 5 to 7 cm of water Endocrine: No thryomegaly Cardiac: Normal S1, S2; irregular rhythm, 3 out of 6 holosystolic murmur best heard at the apex, faint diastolic murmur Lungs: Diminished breath sounds bilaterally Abd: Soft, nontender, no hepatomegaly  Ext: No edema, pulses 2+ Musculoskeletal: No deformities, BUE and BLE strength normal and equal Skin: Warm and dry, no rashes   Neuro: Alert and oriented to person, place, time, and situation, CNII-XII grossly intact, no focal deficits  Psych: Normal mood and affect   EKG:  The EKG was personally reviewed and demonstrates: Atrial fibrillation heart rate 95, left bundle branch block noted Telemetry:  Telemetry was personally reviewed and demonstrates: Atrial fibrillation in the low 100s  Relevant CV Studies: TTE 07/06/2020 1. Left ventricular ejection fraction, by estimation, is 40 to 45%. The  left ventricle has mildly decreased function. The left ventricle  demonstrates regional wall motion abnormalities. Abnormal (paradoxical)  septal motion, consistent with left bundle  branch block There is mild left ventricular hypertrophy. Left ventricular  diastolic parameters are indeterminate.  2. Right ventricular systolic function is mildly reduced. The right  ventricular size is normal. There is moderately elevated pulmonary artery  systolic pressure. The estimated right ventricular systolic pressure is  37.8 mmHg.  3. Left atrial size was moderately dilated.  4. Right atrial size was mildly dilated.  5. The mitral valve is abnormal. Moderate to severe mitral valve  regurgitation.  6. There is an aortic valve vegetation measuring 1.5cm x 0.5cm. The  aortic valve is tricuspid. No aortic stenosis is present. Increased  gradients through AV likely due to AI, visually does appear  stenotic.  Aortic valve regurgitation is moderate to  severe.   Laboratory Data: High Sensitivity Troponin:   Recent Labs  Lab 07/04/20 0756 07/04/20 1014  TROPONINIHS 21* 24*     Cardiac EnzymesNo results for input(s): TROPONINI in the last 168 hours. No results for input(s): TROPIPOC in the last 168 hours.  Chemistry Recent Labs  Lab 07/05/20 0428 07/06/20 0052 07/07/20 0358  NA 133* 130* 129*  K 4.6 4.6 5.1  CL 106 105 103  CO2 18* 19* 22  GLUCOSE 94 180* 99  BUN 37* 41* 39*  CREATININE 1.25* 1.34* 1.31*  CALCIUM 7.8* 7.4* 7.9*  GFRNONAA 42* 39* 40*  ANIONGAP 9 6 4*    Recent Labs  Lab 07/05/20 0428 07/06/20 0052 07/07/20 0358  PROT 6.1* 4.8* 5.0*  ALBUMIN 2.3* 1.9* 2.0*  AST 350* 260* 139*  ALT 333* 276* 208*  ALKPHOS 85 78 81  BILITOT 0.9 0.7 0.9   Hematology Recent Labs  Lab 07/05/20 0428 07/06/20 0052 07/07/20 0358  WBC 14.6* 10.7* 9.9  RBC 4.48 3.51* 3.92  HGB 12.3 10.0* 11.0*  HCT 37.7 29.3* 32.3*  MCV 84.2 83.5 82.4  MCH 27.5 28.5 28.1  MCHC 32.6 34.1 34.1  RDW 15.9* 15.9* 15.8*  PLT 209 179 153   BNPNo results for input(s): BNP, PROBNP in the last 168 hours.  DDimer No results for input(s): DDIMER in the last 168 hours.  Radiology/Studies:  DG Chest 2 View  Result Date: 07/04/2020 CLINICAL DATA:  Syncope EXAM: CHEST - 2 VIEW COMPARISON:  April 01, 2018 FINDINGS: There is an equivocal pleural effusion on each side with mild bibasilar atelectasis. No edema or airspace opacity. Heart is mildly enlarged with pulmonary vascularity normal. Patient is status post coronary artery bypass grafting. No adenopathy. There are foci of degenerative change in the thoracic spine. IMPRESSION: Mild bibasilar atelectasis with equivocal pleural effusion on each side. No airspace opacity or edema evident. Heart mildly enlarged. Status post coronary artery bypass grafting. Electronically Signed   By: Lowella Grip III M.D.   On: 07/04/2020 08:57   DG Ankle  Complete Left  Result Date: 07/04/2020 CLINICAL DATA:  Fall, left ankle pain EXAM: LEFT ANKLE COMPLETE - 3+ VIEW COMPARISON:  None. FINDINGS: Three view radiograph left ankle demonstrates normal alignment. No acute fracture or dislocation. Ankle mortise is intact. No ankle effusion. Small plantar calcaneal spur. Mild bimalleolar soft tissue swelling. Diffuse subcutaneous edema noted involving the visualized left foreleg and left forefoot. IMPRESSION: Soft tissue swelling.  No fracture or dislocation. Electronically Signed   By: Fidela Salisbury MD   On: 07/04/2020 06:48   CT HEAD WO CONTRAST  Result Date: 07/04/2020 CLINICAL DATA:  87 year old female with dizziness.  Fall at home. EXAM: CT HEAD WITHOUT CONTRAST TECHNIQUE: Contiguous axial images were obtained from the base of the skull through the vertex without intravenous contrast. COMPARISON:  [place]  Head CT 07/20/2019. FINDINGS: Brain: Stable cerebral volume from last year, generalized volume loss. No midline shift, ventriculomegaly, mass effect, evidence of mass lesion, intracranial hemorrhage or evidence of cortically based acute infarction. Gray-white matter differentiation is within normal limits throughout the brain. Dystrophic basal ganglia calcifications. No focal encephalomalacia identified. Vascular:  Calcified atherosclerosis at the skull base. No suspicious intracranial vascular hyperdensity. Skull: Stable, intact. Sinuses/Orbits: Chronic left frontal sinus opacification with small osteoma along the posterior left frontoethmoidal recess, unchanged. Other Visualized paranasal sinuses and mastoids are clear. Other: No orbit or scalp soft tissue injury identified. IMPRESSION: 1. No acute intracranial abnormality or acute traumatic injury identified. Stable generalized cerebral volume loss. 2. Chronic left frontal sinus disease. Electronically Signed   By: Genevie Ann M.D.   On: 07/04/2020 07:19   MR LUMBAR SPINE WO CONTRAST  Result Date:  07/04/2020 CLINICAL DATA:  Fall at home. EXAM: MRI LUMBAR SPINE WITHOUT CONTRAST TECHNIQUE: Multiplanar, multisequence MR imaging of the lumbar spine was performed. No intravenous contrast was administered. COMPARISON:  CT abdomen pelvis 07/04/2020. MRI lumbar spine 01/26/2020 FINDINGS: Segmentation:  Normal Alignment: 5 mm retrolisthesis T12-L1 which has progressed since the prior MRI. 3 mm anterolisthesis L4-5 unchanged. Vertebrae: Progressive fracture superior endplate of L1, left greater than right. There is associated fracture of the inferior endplate of W43 right greater than left. There is bone marrow edema throughout the T12 and L1 vertebral bodies. There is endplate irregularity which has progressed from the prior MRI. Mild fluid lateral to the T12-L1 disc space on the right. Retropulsion of bone into the canal with mild spinal stenosis. No other fracture identified. Conus medullaris and cauda equina: Conus extends to the L1-2 level. Conus and cauda equina appear normal. Paraspinal and other soft tissues: Mild paravertebral edema on the right at T12-L1. No fluid collection or epidural abscess. Negative for paravertebral mass. Disc levels: T12-L1: Fracture of T12 and L1 as above with retropulsion of bone into the canal. No significant spinal stenosis. L1-2: Negative L2-3: Mild disc degeneration and mild facet degeneration. No significant spinal stenosis L3-4: Disc degeneration with diffuse endplate spurring and bilateral facet degeneration. Mild to moderate subarticular stenosis bilaterally L4-5: 3 mm anterolisthesis. Diffuse disc bulging. Central and right-sided disc protrusion unchanged. Severe facet degeneration. Moderate to severe spinal stenosis. Severe subarticular and foraminal stenosis on the right. Moderate subarticular stenosis on the left L5-S1: Mild disc and facet degeneration.  Negative for stenosis. IMPRESSION: Moderate fractures of T12 and L1 as noted on CT earlier today. This has progressed  since MRI of 01/26/2020. There is diffuse bone marrow edema throughout T12 and L1. There is mild paravertebral edema in the soft tissues on the right. There is retropulsion of bone into the canal without significant spinal stenosis. No epidural abscess however there is concern infection is present in the disc space. This could be an indolent fracture or possibly superimposed infection that has occurred since the MRI of 01/26/2020. Alternatively, it is possible this is a sterile process with progressive mechanical fracture T12-L1 Multilevel degenerative change. Moderate to severe spinal stenosis L4-5 with severe subarticular and foraminal stenosis on the right. This is unchanged from the earlier MRI. Electronically Signed   By: Franchot Gallo M.D.   On: 07/04/2020 14:49   CT ABDOMEN PELVIS W CONTRAST  Result Date: 07/04/2020 CLINICAL DATA:  syncopal episode this morning and fell in the bathroom. Now having right sided abdominal tenderness EXAM: CT ABDOMEN AND PELVIS WITH CONTRAST TECHNIQUE: Multidetector CT imaging of the abdomen and pelvis was performed using the standard protocol following bolus administration of intravenous contrast. CONTRAST:  36mL OMNIPAQUE IOHEXOL 300 MG/ML  SOLN COMPARISON:  CT abdomen and pelvis January 25, 2019. FINDINGS: Lower chest: Bibasilar atelectasis. Thoracic aortic atherosclerosis. Small hiatal hernia. Hepatobiliary: No suspicious hepatic lesion. Gallbladder surgically absent. Similar prominence  of the biliary tree, likely reservoir effect post cholecystectomy. Pancreas: Mild dorsal duct distension which is stable in comparison to prior studies. No focal pancreatic lesion. Spleen: No splenic injury or perisplenic hematoma. Adrenals/Urinary Tract: Bilateral adrenal glands are unremarkable. No hydronephrosis. Mild urothelial thickening at left-sided extrarenal pelvis. Nonobstructive 4 mm left lower pole renal stone. Bilateral low-density renal lesions are unchanged in comparison  to prior and likely cysts. Urinary bladder is moderately distended without suspicious wall thickening. Stomach/Bowel: Stomach is predominantly decompressed limiting evaluation. No suspicious small bowel wall thickening or dilation. The appendix is not definitely visualized however there is no inflammatory stranding along the cecum to suggest acute inflammation. Moderate volume of formed stool throughout the colon. Sigmoid colonic diverticulosis without findings of acute diverticulitis. Vascular/Lymphatic: Aortic atherosclerosis. No enlarged abdominal or pelvic lymph nodes. Reproductive: Status post hysterectomy. No adnexal masses. Other: No abdominopelvic ascites. Musculoskeletal: New anterior compression deformity of the L1 vertebral body with approximately 50% height loss and mild retropulsion of bone and disc into the spinal canal with narrowing of the canal. Global demineralization of bone. Multilevel degenerative changes spine. Degenerative changes bilateral hips. IMPRESSION: 1. New anterior compression deformity of the L1 vertebral body with approximately 50% height loss and mild retropulsion of bone and disc into the spinal canal with moderate narrowing of the canal at this level. 2. Apparent mild urothelial thickening of a left-sided extrarenal pelvis may reflect ascending urinary tract infection. Correlate with urinalysis. 3. Nonobstructive left nephrolithiasis. 4. Sigmoid colonic diverticulosis without findings of acute diverticulitis. 5. Moderate volume of formed stool throughout the colon. 6. Aortic atherosclerosis.  Aortic Atherosclerosis (ICD10-I70.0). Electronically Signed   By: Dahlia Bailiff MD   On: 07/04/2020 10:04   ECHOCARDIOGRAM COMPLETE  Result Date: 07/06/2020    ECHOCARDIOGRAM REPORT   Patient Name:   MALI EPPARD Date of Exam: 07/06/2020 Medical Rec #:  [id number]        Height:       64.0 in Accession #:    [id number]       Weight:       100.5 lb Date of Birth:  09-13-1932         BSA:           1.460 m Patient Age:    51 years         BP:           127/58 mmHg Patient Gender: F                HR:           95 bpm. Exam Location:  Inpatient Procedure: 2D Echo, Cardiac Doppler and Color Doppler            REPORT CONTAINS CRITICAL RESULT  Results communicated to Dr Cyndia Skeeters at 18:56 on 07/06/20. Indications:    R55 Syncope  History:        Patient has prior history of Echocardiogram examinations, most                 recent 04/03/2020. Prior CABG; Mitral Valve Disease.  Sonographer:    Merrie Roof RDCS Referring Phys: [place]  1. Left ventricular ejection fraction, by estimation, is 40 to 45%. The left ventricle has mildly decreased function. The left ventricle demonstrates regional wall motion abnormalities. Abnormal (paradoxical) septal motion, consistent with left bundle branch block There is mild left ventricular hypertrophy. Left ventricular diastolic parameters are indeterminate.  2. Right ventricular systolic function is  mildly reduced. The right ventricular size is normal. There is moderately elevated pulmonary artery systolic pressure. The estimated right ventricular systolic pressure is 11.9 mmHg.  3. Left atrial size was moderately dilated.  4. Right atrial size was mildly dilated.  5. The mitral valve is abnormal. Moderate to severe mitral valve regurgitation.  6. There is an aortic valve vegetation measuring 1.5cm x 0.5cm. The aortic valve is tricuspid. No aortic stenosis is present. Increased gradients through AV likely due to AI, visually does appear stenotic. Aortic valve regurgitation is moderate to severe. FINDINGS  Left Ventricle: Left ventricular ejection fraction, by estimation, is 40 to 45%. The left ventricle has mildly decreased function. The left ventricle demonstrates regional wall motion abnormalities. The left ventricular internal cavity size was normal in size. There is mild left ventricular hypertrophy. Abnormal (paradoxical) septal motion,  consistent with left bundle branch block. Left ventricular diastolic parameters are indeterminate. Right Ventricle: The right ventricular size is normal. Right vetricular wall thickness was not well visualized. Right ventricular systolic function is mildly reduced. There is moderately elevated pulmonary artery systolic pressure. The tricuspid regurgitant velocity is 3.30 m/s, and with an assumed right atrial pressure of 3 mmHg, the estimated right ventricular systolic pressure is 14.7 mmHg. Left Atrium: Left atrial size was moderately dilated. Right Atrium: Right atrial size was mildly dilated. Pericardium: Trivial pericardial effusion is present. Mitral Valve: The mitral valve is abnormal. Moderate to severe mitral valve regurgitation. Tricuspid Valve: The tricuspid valve is normal in structure. Tricuspid valve regurgitation is mild. Aortic Valve: Aortic valve vegetation measuring 1.5cm x 0.5 cm. The aortic valve is tricuspid. Aortic valve regurgitation is severe. Aortic regurgitation PHT measures 475 msec. No aortic stenosis is present. Aortic valve mean gradient measures 9.0 mmHg. Aortic valve peak gradient measures 17.0 mmHg. Aortic valve area, by VTI measures 0.84 cm. Pulmonic Valve: The pulmonic valve was grossly normal. Pulmonic valve regurgitation is not visualized. Aorta: The aortic root is normal in size and structure. IAS/Shunts: The interatrial septum was not well visualized.  LEFT VENTRICLE PLAX 2D LVIDd:         3.80 cm  Diastology LVIDs:         2.70 cm  LV e' medial:  6.64 cm/s LV PW:         1.00 cm  LV e' lateral: 10.30 cm/s LV IVS:        1.00 cm LVOT diam:     1.70 cm LV SV:         32 LV SV Index:   22 LVOT Area:     2.27 cm  RIGHT VENTRICLE RV Basal diam:  3.40 cm LEFT ATRIUM             Index       RIGHT ATRIUM           Index LA diam:        3.10 cm 2.12 cm/m  RA Area:     16.90 cm LA Vol (A2C):   56.0 ml 38.34 ml/m RA Volume:   45.30 ml  31.02 ml/m LA Vol (A4C):   59.5 ml 40.74 ml/m  LA Biplane Vol: 63.1 ml 43.21 ml/m  AORTIC VALVE AV Area (Vmax):    1.13 cm AV Area (Vmean):   0.95 cm AV Area (VTI):     0.84 cm AV Vmax:           206.00 cm/s AV Vmean:          140.000 cm/s  AV VTI:            0.380 m AV Peak Grad:      17.0 mmHg AV Mean Grad:      9.0 mmHg LVOT Vmax:         103.00 cm/s LVOT Vmean:        58.500 cm/s LVOT VTI:          0.141 m LVOT/AV VTI ratio: 0.37 AI PHT:            475 msec  AORTA Ao Root diam: 2.70 cm Ao Asc diam:  2.40 cm MR Peak grad:    86.5 mmHg   TRICUSPID VALVE MR Mean grad:    46.0 mmHg   TR Peak grad:   43.6 mmHg MR Vmax:         465.00 cm/s TR Vmax:        330.00 cm/s MR Vmean:        307.0 cm/s MR PISA:         2.26 cm    SHUNTS MR PISA Eff ROA: 16 mm      Systemic VTI:  0.14 m MR PISA Radius:  0.60 cm     Systemic Diam: 1.70 cm Oswaldo Milian MD Electronically signed by Oswaldo Milian MD Signature Date/Time: 07/06/2020/7:15:52 PM    Final     Assessment and Plan:   1.  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  Found to have a large aortic valve vegetation.  She has moderate severe aortic insufficiency with severe mitral regurgitation.  Her mitral regurgitation is not new.  She is also had moderate to severe AI in the past. -The aortic valve vegetation is new. -She was recently evaluated for mitral clip procedure.  Due to her frailty she was not deemed a great candidate for this.  She has been evaluated by cardiac surgery and she is not a candidate for surgical intervention of her aortic valve.  This would require multi valvular replacement and at her age and with prior bypass surgery she would not likely survive.  Therefore, surgery will decline her offer to surgery.  She reports she would not be interested anyway. -Due to the fact that she is not a surgical candidate I see no need for repeat transesophageal echocardiogram.  We will not plan  for this while she is here. -I would recommend to continue IV antibiotics. -She does have a history of atrial fibrillation.  I am holding anticoagulation until we know she has no septic emboli to her brain.  Her MRI of her spine is concerning for possible infection.  If she has septic emboli these are very high risk for bleeding.  Anticoagulation would not be indicated.  We will hold this for now. 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  She is not in congestive heart failure.  I did discuss with her that she needs to have a goals of care discussion.  It is unclear if antibiotics will treat this effectively.  I think surgical intervention would be the only definitive treatment, for which she is not a candidate.  I think it would be a great idea for her to see palliative care while she is here.  2. Persistent Afib/Endocarditis -I am holding anticoagulation until we obtain the results of her brain MRI.  If she does have septic emboli she would not be a candidate for anticoagulation moving forward.  We will hold anticoagulation until we have the results of this. -Unclear if she will be a great candidate room for any way for anticoagulation.  Given her endocarditis she is extremely high risk for septic emboli.  Even if she does not have any on her brain MRI she may develop in the future. -We will rate control her with metoprolol tartrate 12.5 mg twice a day.  For questions or updates, please contact [place] Please consult [email or web address] for contact info under   Signed, [place] Bells T. Audie Box, MD, [place]  07/07/2020 3:23 PM

## 2020-07-07 NOTE — Consult Note (Signed)
[address]       [place] [address]             [phone]      Cardiothoracic Surgery Consultation  Reason for Consult: Aortic valve endocarditis Referring Physician: Dr. Bettina Gavia  [name] is an 87 y.o. female.  HPI:   The patient is an 87 year old woman with a history of hypertension, hyperlipidemia, hypothyroidism, fibromyalgia, chronic atrial fibrillation on Eliquis, coronary artery disease status post CABG in 2007 by Dr. Darcey Nora, and multi valvular heart disease who also has significant chronic back pain secondary to lumbar spine osteoarthritis and chronic decreased mobility.  She presented with a fall and question of near syncope.  She reported worsening back pain over the previous few months and said that she was evaluated for surgery but due to her overall condition she is not proceed.  She has had more difficulty with ambulation over the past few months and has been using a walker to ambulate.  She has been weak particularly in the lower extremities and has had some chronic leg numbness.  CT of the head on admission showed no acute intracranial abnormality.  An MRI of lumbar spine showed fractures of T12 and L1 that had progressed from her prior MRI of 01/26/2020 with some paravertebral edema and question of discitis.  She has multilevel degenerative changes with moderate to severe spinal stenosis at L4-5 there was unchanged from her previous MRI.  Due to concerns of infection she underwent a 2D echocardiogram yesterday which showed a 1.5 cm x 0.5 cm vegetation on the aortic valve.  She has known multivalve disease on prior TEE from 04/03/2020 which showed severe mitral regurgitation with prolapse of P1, P2, and A2.  There is also moderate to severe aortic insufficiency at that time and moderate tricuspid regurgitation.  Left ventricular ejection fraction was 50 to 55% at that time.  The aortic valve vegetation was not seen on her TEE in December.  Her 2D  echocardiogram yesterday continues to show moderate to severe aortic insufficiency with a pressure half-time of 475 ms.  There is moderate to severe mitral valve regurgitation and mild tricuspid regurgitation.  Left ventricular ejection fraction was estimated at 40 to 45%.  Past Medical History:  Diagnosis Date  . Allergy   . Anemia, pernicious   . Arthritis    bursitis   . Atrophic gastritis   . B12 deficiency   . CAD (coronary artery disease)   . Carotid artery stenosis   . Cataract   . Diarrhea    on and off for months  . Family history of adverse reaction to anesthesia    extreme nausea brother and sister  . Fibromyalgia   . Gallbladder polyp   . GERD (gastroesophageal reflux disease)   . Heart murmur   . History of hiatal hernia   . History of kidney stones   . HLD (hyperlipidemia)   . HTN (hypertension)   . Hypothyroidism   . Irritable bowel syndrome (IBS)   . Median arcuate ligament syndrome ([facility])   . Osteoporosis   . PVD (peripheral vascular disease) ([facility])   . Tubular adenoma of colon 08/2013   with focal high grade dysplasia    Past Surgical History:  Procedure Laterality Date  . ABDOMINAL HYSTERECTOMY    . APPENDECTOMY    . BIOPSY THYROID    . CARDIOVERSION N/A 04/03/2020   Procedure: CARDIOVERSION;  Surgeon: Dorothy Spark, MD;  Location: [facility];  Service: Cardiovascular;  Laterality: N/A;  . cataract surgery    . CHOLECYSTECTOMY N/A 10/24/2014   Procedure: MICROSCOPIC  CHOLECYSTECTOMY;  Surgeon: Ralene Ok, MD;  Location: [facility];  Service: General;  Laterality: N/A;  . COLONOSCOPY    . CORONARY ARTERY BYPASS GRAFT  07  . ESOPHAGEAL MANOMETRY N/A 08/02/2012   Procedure: ESOPHAGEAL MANOMETRY (EM);  Surgeon: Sable Feil, MD;  Location: [facility];  Service: Endoscopy;  Laterality: N/A;  . ESOPHAGOGASTRODUODENOSCOPY (EGD) WITH PROPOFOL N/A 04/07/2019   Procedure: ESOPHAGOGASTRODUODENOSCOPY (EGD) WITH PROPOFOL;  Surgeon: Milus Banister, MD;   Location: [facility];  Service: Endoscopy;  Laterality: N/A;  . HEMOSTASIS CLIP PLACEMENT  04/07/2019   Procedure: HEMOSTASIS CLIP PLACEMENT;  Surgeon: Milus Banister, MD;  Location: [facility];  Service: Endoscopy;;  . HOT HEMOSTASIS N/A 04/07/2019   Procedure: HOT HEMOSTASIS (ARGON PLASMA COAGULATION/BICAP);  Surgeon: Milus Banister, MD;  Location: [facility];  Service: Endoscopy;  Laterality: N/A;  . POLYPECTOMY  04/07/2019   Procedure: POLYPECTOMY;  Surgeon: Milus Banister, MD;  Location: [facility];  Service: Endoscopy;;  . [name]  04/07/2019   Procedure: [name];  Surgeon: Milus Banister, MD;  Location: [facility];  Service: Endoscopy;;  . TEE WITHOUT CARDIOVERSION N/A 04/03/2020   Procedure: TRANSESOPHAGEAL ECHOCARDIOGRAM (TEE);  Surgeon: Dorothy Spark, MD;  Location: [facility];  Service: Cardiovascular;  Laterality: N/A;  . THYROID LOBECTOMY    . UPPER GASTROINTESTINAL ENDOSCOPY      Family History  Problem Relation Age of Onset  . Heart disease Mother   . Cholelithiasis Mother   . Prostate cancer Brother   . Colon cancer Brother   . Breast cancer Other        niece - breast cancer  . Diabetes Sister   . Lung cancer Father   . Lung cancer Sister   . Stomach cancer Cousin   . Esophageal cancer Neg Hx   . Rectal cancer Neg Hx     Social History:  reports that she has never smoked. She has never used smokeless tobacco. She reports that she does not drink alcohol and does not use drugs.  Allergies:  Allergies  Allergen Reactions  . Actos [Pioglitazone] Swelling    Ankle swelling  . Sulfonamide Derivatives Swelling  . Adhesive [Tape] Other (See Comments)    WITH PROLONGED EXPOSURE (REDNESS/ITCHING)  . Codeine Nausea And Vomiting    Medications:  I have reviewed the patient's current medications. Prior to Admission:  Medications Prior to Admission  Medication Sig Dispense Refill Last Dose  . acetaminophen (TYLENOL) 500 MG tablet  Take 500 mg by mouth as needed for mild pain.   07/03/2020 at Unknown time  . amiodarone (PACERONE) 100 MG tablet Take 1 tablet (100 mg total) by mouth daily. 90 tablet 3 07/03/2020 at Unknown time  . atorvastatin (LIPITOR) 80 MG tablet Take 0.5 tablets (40 mg total) by mouth at bedtime. 45 tablet 0 07/03/2020 at Unknown time  . Calcium Carbonate-Vitamin D (CALTRATE 600+D PO) Take 1 tablet by mouth daily with lunch.    Past Week at Unknown time  . cyanocobalamin (,VITAMIN B-12,) 1000 MCG/ML injection Inject 1,000 mcg into the muscle every 30 (thirty) days.   February  . ELIQUIS 2.5 MG TABS tablet TAKE 1 TABLET BY MOUTH TWICE DAILY (Patient taking differently: Take 2.5 mg by mouth 2 (two) times daily.) 180 tablet 1 07/03/2020 at 0800  . gabapentin (NEURONTIN) 100 MG capsule Take 1  capsule (100 mg total) by mouth 2 (two) times daily. (Patient taking differently: Take 100 mg by mouth as needed (pain).) 60 capsule 1 07/03/2020 at Unknown time  . hyoscyamine (LEVSIN SL) 0.125 MG SL tablet Place 1 tablet (0.125 mg total) under the tongue every 6 (six) hours as needed. (Patient taking differently: Place 0.125 mg under the tongue as needed for cramping.) 30 tablet 2 unknown  . ipratropium (ATROVENT) 0.03 % nasal spray Place 2 sprays into both nostrils every 12 (twelve) hours.   07/03/2020 at Unknown time  . lactase (LACTAID) 3000 units tablet Take 3,000 Units by mouth 3 (three) times daily with meals as needed (lactose Intolerance).    Past Week at Unknown time  . levothyroxine (SYNTHROID, LEVOTHROID) 50 MCG tablet Take 50 mcg by mouth at bedtime.   07/03/2020 at Unknown time  . Lidocaine 4 % OINT Apply 1 application topically 3 (three) times daily as needed (pain). Roll-On (otc)   Past Week at Unknown time  . Lidocaine 4 % PTCH Place 1 patch onto the skin daily as needed (pain.).    07/03/2020 at Unknown time  . losartan (COZAAR) 100 MG tablet Take 1 tablet (100 mg total) by mouth daily. 90 tablet 3 07/03/2020 at Unknown time   . Multiple Vitamins-Minerals (PRESERVISION AREDS 2 PO) Take 1 tablet by mouth 2 (two) times daily.   07/03/2020 at Unknown time  . Polyethyl Glycol-Propyl Glycol (SYSTANE FREE OP) Place 1 drop into both eyes 3 (three) times daily as needed (dry/irritated eyes.).   07/02/2020  . saccharomyces boulardii (FLORASTOR) 250 MG capsule Take 250 mg by mouth as needed (digestion).   unknown  . sodium chloride (OCEAN) 0.65 % SOLN nasal spray Place 1 spray into both nostrils as needed for congestion.   unknown  . spironolactone (ALDACTONE) 25 MG tablet TAKE 1 TABLET BY MOUTH ONCE DAILY (Patient taking differently: Take 25 mg by mouth daily.) 90 tablet 2 07/03/2020 at Unknown time  . sucralfate (CARAFATE) 1 GM/10ML suspension TAKE 10 MLS (1 G TOTAL) BY MOUTH 2 (TWO) TIMES DAILY. (Patient taking differently: Take 1 g by mouth as needed (reflux).) 420 mL 1 Past Week at Unknown time  . traMADol (ULTRAM) 50 MG tablet Take 50 mg by mouth as needed for moderate pain or severe pain.   07/03/2020 at Unknown time   Scheduled: . acetaminophen  1,000 mg Oral Q8H  . Chlorhexidine Gluconate Cloth  6 each Topical Daily  . feeding supplement  1 Container Oral TID BM  . heparin  1,200 Units Intravenous Once  . ipratropium  1 spray Each Nare Q12H  . lactose free nutrition  237 mL Oral TID WC  . levothyroxine  50 mcg Oral QHS  . lidocaine  1 patch Transdermal Q24H  . multivitamin with minerals  1 tablet Oral Daily  . polyethylene glycol  17 g Oral Daily   Continuous: . cefTRIAXone (ROCEPHIN)  IV 2 g (07/07/20 0923)  . DAPTOmycin (CUBICIN)  IV 365 mg (07/07/20 1136)  . heparin     KDX:[id number] (DILAUDID) injection, hyoscyamine, labetalol, lactase, lidocaine, oxyCODONE, sodium chloride, sucralfate, traMADol Anti-infectives (From admission, onward)   Start     Dose/Rate Route Frequency Ordered Stop   07/07/20 0915  DAPTOmycin (CUBICIN) 365 mg in sodium chloride 0.9 % IVPB        8 mg/kg  45.6 kg 214.6 mL/hr over 30  Minutes Intravenous Every 48 hours 07/07/20 0824     07/07/20 0900  cefTRIAXone (ROCEPHIN) 2  g in sodium chloride 0.9 % 100 mL IVPB        2 g 200 mL/hr over 30 Minutes Intravenous Daily 07/07/20 0812     07/05/20 1030  cefTRIAXone (ROCEPHIN) 1 g in sodium chloride 0.9 % 100 mL IVPB  Status:  Discontinued        1 g 200 mL/hr over 30 Minutes Intravenous Every 24 hours 07/04/20 1326 07/06/20 1133   07/04/20 1030  cefTRIAXone (ROCEPHIN) 1 g in sodium chloride 0.9 % 100 mL IVPB        1 g 200 mL/hr over 30 Minutes Intravenous  Once 07/04/20 1025 07/04/20 1107      Results for orders placed or performed during the hospital encounter of 07/04/20 (from the past 48 hour(s))  Comprehensive metabolic panel     Status: Abnormal   Collection Time: 07/06/20 12:52 AM  Result Value Ref Range   Sodium 130 (L) 135 - 145 mmol/L   Potassium 4.6 3.5 - 5.1 mmol/L   Chloride 105 98 - 111 mmol/L   CO2 19 (L) 22 - 32 mmol/L   Glucose, Bld 180 (H) 70 - 99 mg/dL    Comment: Glucose reference range applies only to samples taken after fasting for at least 8 hours.   BUN 41 (H) 8 - 23 mg/dL   Creatinine, Ser 1.34 (H) 0.44 - 1.00 mg/dL   Calcium 7.4 (L) 8.9 - 10.3 mg/dL   Total Protein 4.8 (L) 6.5 - 8.1 g/dL   Albumin 1.9 (L) 3.5 - 5.0 g/dL   AST 260 (H) 15 - 41 U/L   ALT 276 (H) 0 - 44 U/L   Alkaline Phosphatase 78 38 - 126 U/L   Total Bilirubin 0.7 0.3 - 1.2 mg/dL   GFR, Estimated 39 (L) >60 mL/min    Comment: (NOTE) Calculated using the CKD-EPI Creatinine Equation (2021)    Anion gap 6 5 - 15    Comment: Performed at [facility], [place] [address], [place], [place] [address]  CBC     Status: Abnormal   Collection Time: 07/06/20 12:52 AM  Result Value Ref Range   WBC 10.7 (H) 4.0 - 10.5 K/uL   RBC 3.51 (L) 3.87 - 5.11 MIL/uL   Hemoglobin 10.0 (L) 12.0 - 15.0 g/dL   HCT 29.3 (L) 36.0 - 46.0 %   MCV 83.5 80.0 - 100.0 fL   MCH 28.5 26.0 - 34.0 pg   MCHC 34.1 30.0 - 36.0 g/dL   RDW 15.9 (H) 11.5  - 15.5 %   Platelets 179 150 - 400 K/uL   nRBC 0.0 0.0 - 0.2 %    Comment: Performed at [facility], [place] [address], [place], [place] [address]  Comprehensive metabolic panel     Status: Abnormal   Collection Time: 07/07/20  3:58 AM  Result Value Ref Range   Sodium 129 (L) 135 - 145 mmol/L   Potassium 5.1 3.5 - 5.1 mmol/L   Chloride 103 98 - 111 mmol/L   CO2 22 22 - 32 mmol/L   Glucose, Bld 99 70 - 99 mg/dL    Comment: Glucose reference range applies only to samples taken after fasting for at least 8 hours.   BUN 39 (H) 8 - 23 mg/dL   Creatinine, Ser 1.31 (H) 0.44 - 1.00 mg/dL   Calcium 7.9 (L) 8.9 - 10.3 mg/dL   Total Protein 5.0 (L) 6.5 - 8.1 g/dL   Albumin 2.0 (L) 3.5 - 5.0 g/dL  AST 139 (H) 15 - 41 U/L   ALT 208 (H) 0 - 44 U/L   Alkaline Phosphatase 81 38 - 126 U/L   Total Bilirubin 0.9 0.3 - 1.2 mg/dL   GFR, Estimated 40 (L) >60 mL/min    Comment: (NOTE) Calculated using the CKD-EPI Creatinine Equation (2021)    Anion gap 4 (L) 5 - 15    Comment: Performed at [facility] [address] [place], [place] [address]  CBC     Status: Abnormal   Collection Time: 07/07/20  3:58 AM  Result Value Ref Range   WBC 9.9 4.0 - 10.5 K/uL   RBC 3.92 3.87 - 5.11 MIL/uL   Hemoglobin 11.0 (L) 12.0 - 15.0 g/dL   HCT 32.3 (L) 36.0 - 46.0 %   MCV 82.4 80.0 - 100.0 fL   MCH 28.1 26.0 - 34.0 pg   MCHC 34.1 30.0 - 36.0 g/dL   RDW 15.8 (H) 11.5 - 15.5 %   Platelets 153 150 - 400 K/uL   nRBC 0.0 0.0 - 0.2 %    Comment: Performed at [facility], [place] [address], [place], [place] [address]  Protime-INR     Status: Abnormal   Collection Time: 07/07/20  3:58 AM  Result Value Ref Range   Prothrombin Time 19.3 (H) 11.4 - 15.2 seconds   INR 1.7 (H) 0.8 - 1.2    Comment: (NOTE) INR goal varies based on device and disease states. Performed at [facility], [place] [address], [place], [place] [address]   Magnesium     Status: None   Collection Time: 07/07/20  3:58 AM   Result Value Ref Range   Magnesium 2.0 1.7 - 2.4 mg/dL    Comment: Performed at [facility] [address] [place], [place] [address]  Phosphorus     Status: None   Collection Time: 07/07/20  3:58 AM  Result Value Ref Range   Phosphorus 2.8 2.5 - 4.6 mg/dL    Comment: Performed at [facility] [address], [place], [place] [address]  Cortisol-am, blood     Status: Abnormal   Collection Time: 07/07/20  7:43 AM  Result Value Ref Range   Cortisol - AM 23.2 (H) 6.7 - 22.6 ug/dL    Comment: Performed at [facility] [address] [place], [place] [address]  TSH     Status: Abnormal   Collection Time: 07/07/20  7:43 AM  Result Value Ref Range   TSH 11.642 (H) 0.350 - 4.500 uIU/mL    Comment: Performed by a 3rd Generation assay with a functional sensitivity of <=0.01 uIU/mL. Performed at [facility], [place] [address], [place],  [address]     ECHOCARDIOGRAM COMPLETE  Result Date: 07/06/2020    ECHOCARDIOGRAM REPORT   Patient Name:   [name] Date of Exam: 07/06/2020 Medical Rec #:  [id number]        Height:       64.0 in Accession #:    [id number]       Weight:       100.5 lb Date of Birth:  1933-02-16         BSA:          1.460 m Patient Age:    48 years         BP:           127/58 mmHg Patient Gender: F  HR:           95 bpm. Exam Location:  Inpatient Procedure: 2D Echo, Cardiac Doppler and Color Doppler            REPORT CONTAINS CRITICAL RESULT  Results communicated to Dr Cyndia Skeeters at 18:56 on 07/06/20. Indications:    R55 Syncope  History:        Patient has prior history of Echocardiogram examinations, most                 recent 04/03/2020. Prior CABG; Mitral Valve Disease.  Sonographer:    Merrie Roof RDCS Referring Phys: [place]  1. Left ventricular ejection fraction, by estimation, is 40 to 45%. The left ventricle has mildly decreased function. The left ventricle demonstrates regional wall motion  abnormalities. Abnormal (paradoxical) septal motion, consistent with left bundle branch block There is mild left ventricular hypertrophy. Left ventricular diastolic parameters are indeterminate.  2. Right ventricular systolic function is mildly reduced. The right ventricular size is normal. There is moderately elevated pulmonary artery systolic pressure. The estimated right ventricular systolic pressure is 18.5 mmHg.  3. Left atrial size was moderately dilated.  4. Right atrial size was mildly dilated.  5. The mitral valve is abnormal. Moderate to severe mitral valve regurgitation.  6. There is an aortic valve vegetation measuring 1.5cm x 0.5cm. The aortic valve is tricuspid. No aortic stenosis is present. Increased gradients through AV likely due to AI, visually does appear stenotic. Aortic valve regurgitation is moderate to severe. FINDINGS  Left Ventricle: Left ventricular ejection fraction, by estimation, is 40 to 45%. The left ventricle has mildly decreased function. The left ventricle demonstrates regional wall motion abnormalities. The left ventricular internal cavity size was normal in size. There is mild left ventricular hypertrophy. Abnormal (paradoxical) septal motion, consistent with left bundle branch block. Left ventricular diastolic parameters are indeterminate. Right Ventricle: The right ventricular size is normal. Right vetricular wall thickness was not well visualized. Right ventricular systolic function is mildly reduced. There is moderately elevated pulmonary artery systolic pressure. The tricuspid regurgitant velocity is 3.30 m/s, and with an assumed right atrial pressure of 3 mmHg, the estimated right ventricular systolic pressure is 63.1 mmHg. Left Atrium: Left atrial size was moderately dilated. Right Atrium: Right atrial size was mildly dilated. Pericardium: Trivial pericardial effusion is present. Mitral Valve: The mitral valve is abnormal. Moderate to severe mitral valve regurgitation.  Tricuspid Valve: The tricuspid valve is normal in structure. Tricuspid valve regurgitation is mild. Aortic Valve: Aortic valve vegetation measuring 1.5cm x 0.5 cm. The aortic valve is tricuspid. Aortic valve regurgitation is severe. Aortic regurgitation PHT measures 475 msec. No aortic stenosis is present. Aortic valve mean gradient measures 9.0 mmHg. Aortic valve peak gradient measures 17.0 mmHg. Aortic valve area, by VTI measures 0.84 cm. Pulmonic Valve: The pulmonic valve was grossly normal. Pulmonic valve regurgitation is not visualized. Aorta: The aortic root is normal in size and structure. IAS/Shunts: The interatrial septum was not well visualized.  LEFT VENTRICLE PLAX 2D LVIDd:         3.80 cm  Diastology LVIDs:         2.70 cm  LV e' medial:  6.64 cm/s LV PW:         1.00 cm  LV e' lateral: 10.30 cm/s LV IVS:        1.00 cm LVOT diam:     1.70 cm LV SV:  32 LV SV Index:   22 LVOT Area:     2.27 cm  RIGHT VENTRICLE RV Basal diam:  3.40 cm LEFT ATRIUM             Index       RIGHT ATRIUM           Index LA diam:        3.10 cm 2.12 cm/m  RA Area:     16.90 cm LA Vol (A2C):   56.0 ml 38.34 ml/m RA Volume:   45.30 ml  31.02 ml/m LA Vol (A4C):   59.5 ml 40.74 ml/m LA Biplane Vol: 63.1 ml 43.21 ml/m  AORTIC VALVE AV Area (Vmax):    1.13 cm AV Area (Vmean):   0.95 cm AV Area (VTI):     0.84 cm AV Vmax:           206.00 cm/s AV Vmean:          140.000 cm/s AV VTI:            0.380 m AV Peak Grad:      17.0 mmHg AV Mean Grad:      9.0 mmHg LVOT Vmax:         103.00 cm/s LVOT Vmean:        58.500 cm/s LVOT VTI:          0.141 m LVOT/AV VTI ratio: 0.37 AI PHT:            475 msec  AORTA Ao Root diam: 2.70 cm Ao Asc diam:  2.40 cm MR Peak grad:    86.5 mmHg   TRICUSPID VALVE MR Mean grad:    46.0 mmHg   TR Peak grad:   43.6 mmHg MR Vmax:         465.00 cm/s TR Vmax:        330.00 cm/s MR Vmean:        307.0 cm/s MR PISA:         2.26 cm    SHUNTS MR PISA Eff ROA: 16 mm      Systemic VTI:  0.14 m MR  PISA Radius:  0.60 cm     Systemic Diam: 1.70 cm Oswaldo Milian MD Electronically signed by Oswaldo Milian MD Signature Date/Time: 07/06/2020/7:15:52 PM    Final     Review of Systems  Constitutional: Positive for activity change, appetite change and unexpected weight change. Negative for fever.  Respiratory: Negative for cough and shortness of breath.   Cardiovascular: Negative for chest pain, palpitations and leg swelling.  Genitourinary: Negative.   Musculoskeletal: Positive for arthralgias, back pain and gait problem.  Neurological: Positive for syncope and light-headedness. Negative for dizziness.  Hematological: Negative.   Psychiatric/Behavioral: Negative.    Blood pressure (!) 123/57, pulse 99, temperature 97.6 F (36.4 C), temperature source Oral, resp. rate 18, height 5\' 4"  (1.626 m), weight 45.6 kg, SpO2 100 %. Physical Exam Constitutional:      Comments: Elderly, thin, frail woman in no distress  HENT:     Head: Normocephalic and atraumatic.  Eyes:     Extraocular Movements: Extraocular movements intact.     Pupils: Pupils are equal, round, and reactive to light.  Cardiovascular:     Rate and Rhythm: Normal rate and regular rhythm.     Heart sounds: Murmur heard.      Comments: 2/6 systolic murmur along the left lower sternal border.  2/6 diastolic murmur at the apex. Pulmonary:  Effort: Pulmonary effort is normal.     Breath sounds: Normal breath sounds.  Abdominal:     General: Abdomen is flat. Bowel sounds are normal.     Palpations: Abdomen is soft.     Tenderness: There is no abdominal tenderness.  Musculoskeletal:        General: No swelling.  Skin:    General: Skin is warm and dry.  Neurological:     General: No focal deficit present.     Mental Status: She is oriented to person, place, and time.  Psychiatric:        Mood and Affect: Mood normal.        Behavior: Behavior normal.     ECHOCARDIOGRAM REPORT       Patient Name:   CALIANA SPIRES Date of Exam: 07/06/2020  Medical Rec #: [id number]    Height:    64.0 in  Accession #:  [id number]    Weight:    100.5 lb  Date of Birth: 07-05-1932     BSA:     1.460 m  Patient Age:  45 years     BP:      127/58 mmHg  Patient Gender: F        HR:      95 bpm.  Exam Location: Inpatient   Procedure: 2D Echo, Cardiac Doppler and Color Doppler        REPORT CONTAINS CRITICAL RESULT    Results communicated to Dr Cyndia Skeeters at 18:56 on 07/06/20.   Indications:  R55 Syncope    History:    Patient has prior history of Echocardiogram examinations,  most         recent 04/03/2020. Prior CABG; Mitral Valve Disease.    Sonographer:  Merrie Roof RDCS  Referring Phys: [place]    1. Left ventricular ejection fraction, by estimation, is 40 to 45%. The  left ventricle has mildly decreased function. The left ventricle  demonstrates regional wall motion abnormalities. Abnormal (paradoxical)  septal motion, consistent with left bundle  branch block There is mild left ventricular hypertrophy. Left ventricular  diastolic parameters are indeterminate.  2. Right ventricular systolic function is mildly reduced. The right  ventricular size is normal. There is moderately elevated pulmonary artery  systolic pressure. The estimated right ventricular systolic pressure is  86.7 mmHg.  3. Left atrial size was moderately dilated.  4. Right atrial size was mildly dilated.  5. The mitral valve is abnormal. Moderate to severe mitral valve  regurgitation.  6. There is an aortic valve vegetation measuring 1.5cm x 0.5cm. The  aortic valve is tricuspid. No aortic stenosis is present. Increased  gradients through AV likely due to AI, visually does appear stenotic.  Aortic valve regurgitation is moderate to  severe.   FINDINGS  Left Ventricle: Left ventricular ejection fraction, by  estimation, is 40  to 45%. The left ventricle has mildly decreased function. The left  ventricle demonstrates regional wall motion abnormalities. The left  ventricular internal cavity size was normal  in size. There is mild left ventricular hypertrophy. Abnormal  (paradoxical) septal motion, consistent with left bundle branch block.  Left ventricular diastolic parameters are indeterminate.   Right Ventricle: The right ventricular size is normal. Right vetricular  wall thickness was not well visualized. Right ventricular systolic  function is mildly reduced. There is moderately elevated pulmonary artery  systolic pressure. The tricuspid  regurgitant velocity is 3.30 m/s, and with an assumed right  atrial  pressure of 3 mmHg, the estimated right ventricular systolic pressure is  86.5 mmHg.   Left Atrium: Left atrial size was moderately dilated.   Right Atrium: Right atrial size was mildly dilated.   Pericardium: Trivial pericardial effusion is present.   Mitral Valve: The mitral valve is abnormal. Moderate to severe mitral  valve regurgitation.   Tricuspid Valve: The tricuspid valve is normal in structure. Tricuspid  valve regurgitation is mild.   Aortic Valve: Aortic valve vegetation measuring 1.5cm x 0.5 cm. The aortic  valve is tricuspid. Aortic valve regurgitation is severe. Aortic  regurgitation PHT measures 475 msec. No aortic stenosis is present. Aortic  valve mean gradient measures 9.0 mmHg.  Aortic valve peak gradient measures 17.0 mmHg. Aortic valve area, by VTI  measures 0.84 cm.   Pulmonic Valve: The pulmonic valve was grossly normal. Pulmonic valve  regurgitation is not visualized.   Aorta: The aortic root is normal in size and structure.   IAS/Shunts: The interatrial septum was not well visualized.     LEFT VENTRICLE  PLAX 2D  LVIDd:     3.80 cm Diastology  LVIDs:     2.70 cm LV e' medial: 6.64 cm/s  LV PW:     1.00 cm LV e' lateral: 10.30  cm/s  LV IVS:    1.00 cm  LVOT diam:   1.70 cm  LV SV:     32  LV SV Index:  22  LVOT Area:   2.27 cm     RIGHT VENTRICLE  RV Basal diam: 3.40 cm   LEFT ATRIUM       Index    RIGHT ATRIUM      Index  LA diam:    3.10 cm 2.12 cm/m RA Area:   16.90 cm  LA Vol (A2C):  56.0 ml 38.34 ml/m RA Volume:  45.30 ml 31.02 ml/m  LA Vol (A4C):  59.5 ml 40.74 ml/m  LA Biplane Vol: 63.1 ml 43.21 ml/m  AORTIC VALVE  AV Area (Vmax):  1.13 cm  AV Area (Vmean):  0.95 cm  AV Area (VTI):   0.84 cm  AV Vmax:      206.00 cm/s  AV Vmean:     140.000 cm/s  AV VTI:      0.380 m  AV Peak Grad:   17.0 mmHg  AV Mean Grad:   9.0 mmHg  LVOT Vmax:     103.00 cm/s  LVOT Vmean:    58.500 cm/s  LVOT VTI:     0.141 m  LVOT/AV VTI ratio: 0.37  AI PHT:      475 msec    AORTA  Ao Root diam: 2.70 cm  Ao Asc diam: 2.40 cm   MR Peak grad:  86.5 mmHg  TRICUSPID VALVE  MR Mean grad:  46.0 mmHg  TR Peak grad:  43.6 mmHg  MR Vmax:     465.00 cm/s TR Vmax:    330.00 cm/s  MR Vmean:    307.0 cm/s  MR PISA:     2.26 cm  SHUNTS  MR PISA Eff ROA: 16 mm   Systemic VTI: 0.14 m  MR PISA Radius: 0.60 cm   Systemic Diam: 1.70 cm   Oswaldo Milian MD  Electronically signed by Oswaldo Milian MD  Signature Date/Time: 07/06/2020/7:15:52 PM     Assessment/Plan:  This 87 year old woman has known severe multi valvular heart disease by prior echo with known moderate to severe aortic insufficiency, severe mitral regurgitation, and  moderate tricuspid regurgitation.  She has had prior coronary bypass surgery in 2007.  She was evaluated by Dr. Burt Knack for consideration of MitraClip but given her multiple medical problems and frailty and the fact that she improved with restoration of sinus rhythm it was felt that continued medical therapy was best.  She now presents with a several month history  of worsening back pain and lower extremity weakness with difficulty with ambulation and a fall with a lumbar compression fracture and question of discitis.  2D echo shows a new aortic valve vegetation suspicious for endocarditis.  She had greater than 100,000 colonies of E. coli in her urine on presentation but blood cultures are still pending.  Given her advanced age, marked frailty, poor mobility and malnutrition with previous bypass surgery I do not think she would be a candidate for surgical treatment of her endocarditis.  I discussed this with the patient and her sister at the bedside and they seemed to understand.  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  Gaye Pollack 07/07/2020, 3:38 PM

## 2020-07-07 NOTE — Plan of Care (Signed)
Patient is currently resting in bed. VSS. OOB w/ back brace. Voiding. C/o pain given tramadol PRN. Call bell within reach. Bed alarm on.   Problem: Education: Goal: Knowledge of General Education information will improve Description: Including pain rating scale, medication(s)/side effects and non-pharmacologic comfort measures Outcome: Progressing   Problem: Health Behavior/Discharge Planning: Goal: Ability to manage health-related needs will improve Outcome: Progressing   Problem: Clinical Measurements: Goal: Ability to maintain clinical measurements within normal limits will improve Outcome: Progressing Goal: Will remain free from infection Outcome: Progressing Goal: Diagnostic test results will improve Outcome: Progressing Goal: Respiratory complications will improve Outcome: Progressing Goal: Cardiovascular complication will be avoided Outcome: Progressing   Problem: Nutrition: Goal: Adequate nutrition will be maintained Outcome: Progressing   Problem: Activity: Goal: Risk for activity intolerance will decrease Outcome: Progressing   Problem: Coping: Goal: Level of anxiety will decrease Outcome: Progressing   Problem: Elimination: Goal: Will not experience complications related to bowel motility Outcome: Progressing Goal: Will not experience complications related to urinary retention Outcome: Progressing   Problem: Pain Managment: Goal: General experience of comfort will improve Outcome: Progressing   Problem: Safety: Goal: Ability to remain free from injury will improve Outcome: Progressing   Problem: Skin Integrity: Goal: Risk for impaired skin integrity will decrease Outcome: Progressing

## 2020-07-07 NOTE — PMR Pre-admission (Incomplete)
PMR Admission Coordinator Pre-Admission Assessment  Patient: [name] is an 87 y.o., female MRN: [id number] DOB: 1932/07/23 Height: 5\' 4"  (162.6 cm) Weight: 45.6 kg  Insurance Information HMO: ***    PPO: ***     PCP:      IPA:      80/20:      OTHER:  PRIMARY: [employer] Medicare      Policy#: [id number]      Subscriber: patient CM Name: ***      Phone#: ***     Fax#: *** Pre-Cert#: ***      Employer: *** Benefits:  Phone #: ***     Name: *** Irene Shipper. Date: ***     Deduct: ***      Out of Pocket Max: ***      Life Max: *** CIR: ***      SNF: *** Outpatient: ***     Co-Pay: *** Home Health: ***      Co-Pay: *** DME: ***     Co-Pay: *** Providers: in-network SECONDARY:       Policy#:     Phone#:   Financial Counselor:       Phone#:   The "Data Collection Information Summary" for patients in Inpatient Rehabilitation Facilities with attached "Privacy Act [place] Records" was provided and verbally reviewed with: {CHL IP Patient Family NW:[id number]}  Emergency Contact Information Contact Information    Name Relation Home Work Mobile   [place] Spouse [phone]    Leward Quan   [phone]      Current Medical History  Patient Admitting Diagnosis: compression fx of L1 vertebra History of Present Illness: ***    Patient's medical record from [facility] has been reviewed by the rehabilitation admission coordinator and physician.  Past Medical History  Past Medical History:  Diagnosis Date  . Allergy   . Anemia, pernicious   . Arthritis    bursitis   . Atrophic gastritis   . B12 deficiency   . CAD (coronary artery disease)   . Carotid artery stenosis   . Cataract   . Diarrhea    on and off for months  . Family history of adverse reaction to anesthesia    extreme nausea brother and sister  . Fibromyalgia   . Gallbladder polyp   . GERD (gastroesophageal reflux disease)   . Heart murmur   . History of hiatal hernia   .  History of kidney stones   . HLD (hyperlipidemia)   . HTN (hypertension)   . Hypothyroidism   . Irritable bowel syndrome (IBS)   . Median arcuate ligament syndrome ([place])   . Osteoporosis   . PVD (peripheral vascular disease) ([place])   . Tubular adenoma of colon 08/2013   with focal high grade dysplasia    Family History   family history includes Breast cancer in an other family member; Cholelithiasis in her mother; Colon cancer in her brother; Diabetes in her sister; Heart disease in her mother; Lung cancer in her father and sister; Prostate cancer in her brother; Stomach cancer in her cousin.  Prior Rehab/Hospitalizations Has the patient had prior rehab or hospitalizations prior to admission? Yes  Has the patient had major surgery during 100 days prior to admission? Yes   Current Medications  Current Facility-Administered Medications:  .  acetaminophen (TYLENOL) tablet 1,000 mg, 1,000 mg, Oral, Q8H, Nicole Kindred A, DO, 1,000 mg at 07/07/20 0605 .  cefTRIAXone (ROCEPHIN) 2 g in sodium chloride 0.9 %  100 mL IVPB, 2 g, Intravenous, Daily, Gonfa, Taye T, MD, Last Rate: 200 mL/hr at 07/07/20 0923, 2 g at 07/07/20 0923 .  Chlorhexidine Gluconate Cloth 2 % PADS 6 each, 6 each, Topical, Daily, Lequita Halt, MD, 6 each at 07/07/20 1239 .  DAPTOmycin (CUBICIN) 365 mg in sodium chloride 0.9 % IVPB, 8 mg/kg, Intravenous, Q48H, Henri Medal, RPH, Last Rate: 214.6 mL/hr at 07/07/20 1136, 365 mg at 07/07/20 1136 .  feeding supplement (BOOST / RESOURCE BREEZE) liquid 1 Container, 1 Container, Oral, TID BM, Nicole Kindred A, DO, 1 Container at 07/06/20 1316 .  HYDROmorphone (DILAUDID) injection 0.5 mg, 0.5 mg, Intravenous, Q2H PRN, Nicole Kindred A, DO .  hyoscyamine (LEVSIN SL) SL tablet 0.125 mg, 0.125 mg, Sublingual, Q6H PRN, Wynetta Fines T, MD .  ipratropium (ATROVENT) 0.06 % nasal spray 1 spray, 1 spray, Each Nare, Q12H, Wynetta Fines T, MD, 1 spray at 07/06/20 1317 .  labetalol (NORMODYNE)  tablet 100 mg, 100 mg, Oral, TID PRN, Wynetta Fines T, MD .  lactase (LACTAID) tablet 3,000 Units, 3,000 Units, Oral, TID WC PRN, Wynetta Fines T, MD .  lactose free nutrition (BOOST PLUS) liquid 237 mL, 237 mL, Oral, TID WC, Gonfa, Taye T, MD, 237 mL at 07/07/20 1202 .  levothyroxine (SYNTHROID) tablet 50 mcg, 50 mcg, Oral, QHS, Lequita Halt, MD, 50 mcg at 07/06/20 2220 .  lidocaine (LIDODERM) 5 % 1 patch, 1 patch, Transdermal, Daily PRN, Lequita Halt, MD, 1 patch at 07/05/20 1327 .  lidocaine (LIDODERM) 5 % 1 patch, 1 patch, Transdermal, Q24H, Nicole Kindred A, DO, 1 patch at 07/06/20 1543 .  multivitamin with minerals tablet 1 tablet, 1 tablet, Oral, Daily, Ezekiel Slocumb, DO, 1 tablet at 07/07/20 0931 .  oxyCODONE (Oxy IR/ROXICODONE) immediate release tablet 5 mg, 5 mg, Oral, Q4H PRN, Nicole Kindred A, DO, 5 mg at 07/06/20 0929 .  polyethylene glycol (MIRALAX / GLYCOLAX) packet 17 g, 17 g, Oral, Daily, Wynetta Fines T, MD, 17 g at 07/05/20 0929 .  sodium chloride (OCEAN) 0.65 % nasal spray 1 spray, 1 spray, Each Nare, TID PRN, Wynetta Fines T, MD .  sucralfate (CARAFATE) 1 GM/10ML suspension 1 g, 1 g, Oral, BID PRN, Wynetta Fines T, MD .  traMADol Veatrice Bourbon) tablet 50 mg, 50 mg, Oral, BID PRN, Wynetta Fines T, MD, 50 mg at 07/07/20 1159  Patients Current Diet:  Diet Order            Diet Heart Room service appropriate? Yes; Fluid consistency: Thin  Diet effective now                 Precautions / Restrictions Precautions Precautions: Fall,Back Precaution Booklet Issued: Yes (comment) Spinal Brace: Thoracolumbosacral orthotic Restrictions Weight Bearing Restrictions: No   Has the patient had 2 or more falls or a fall with injury in the past year? Yes  Prior Activity Level Limited Community (1-2x/wk): doctor's appointments only  Prior Functional Level Self Care: Did the patient need help bathing, dressing, using the toilet or eating? Needed some help  Indoor Mobility: Did the  patient need assistance with walking from room to room (with or without device)? Independent  Stairs: Did the patient need assistance with internal or external stairs (with or without device)? Needed some help  Functional Cognition: Did the patient need help planning regular tasks such as shopping or remembering to take medications? [place] / Equipment Home Assistive Devices/Equipment: Gilford Rile (specify type) Home  Equipment: Shower seat,Walker - 2 wheels,Walker - standard  Prior Device Use: Indicate devices/aids used by the patient prior to current illness, exacerbation or injury? Walker  Current Functional Level Cognition  Overall Cognitive Status: Within Functional Limits for tasks assessed Current Attention Level: Selective Orientation Level: Oriented X4 Following Commands: Follows one step commands consistently,Follows one step commands with increased time,Follows multi-step commands with increased time General Comments: much less anxious and able to demonstrate back adherence to precautions    Extremity Assessment (includes Sensation/Coordination)  Upper Extremity Assessment: Defer to OT evaluation  Lower Extremity Assessment: RLE deficits/detail,LLE deficits/detail RLE Deficits / Details: ROM WFL, strength 4/4 RLE Sensation: decreased light touch (from ankles down) RLE Coordination: decreased fine motor LLE Deficits / Details: ROM WFL, strength 4/5 LLE Sensation: decreased light touch (from ankles down) LLE Coordination: decreased fine motor    ADLs  Overall ADL's : Needs assistance/impaired Eating/Feeding: Set up,Sitting Grooming: Set up,Sitting Upper Body Bathing: Minimal assistance,Sitting Lower Body Bathing: Moderate assistance,Sit to/from stand Upper Body Dressing : Minimal assistance,Sitting Upper Body Dressing Details (indicate cue type and reason): increased assist needed for brace mgmt Lower Body Dressing: Maximal assistance,Sit to/from  stand Lower Body Dressing Details (indicate cue type and reason): difficulty in bringing feet to self L > R due to pain.unable to bend to feet due to precautions. educated on AE trial, repositioning techniques or husband assist Toilet Transfer: Minimal assistance,Stand-pivot,BSC,RW Toileting- Clothing Manipulation and Hygiene: Moderate assistance,Sit to/from stand General ADL Comments: Pt limited primarily by back pain, also with subsequent muscle weakness and shakiness in standing with attempts at mobility (LE weakness > UE weakness)    Mobility  Overal bed mobility: Needs Assistance Bed Mobility: Rolling,Sidelying to Sit Rolling: Min assist Sidelying to sit: Min assist General bed mobility comments: min A for rolling off of bed pan, pt with good LE management and trunk to upright, minimal assist to bring hips to EoB with pad    Transfers  Overall transfer level: Needs assistance Equipment used: Rolling walker (2 wheeled) Transfers: Sit to/from [employer] Sit to Stand: Min assist Stand pivot transfers: Min assist General transfer comment: very light min A for power up and stepping pivot to recliner. MD in room so ambulation deferred    Ambulation / Gait / Stairs / Wheelchair Mobility  Ambulation/Gait Ambulation/Gait assistance: Min assist,Mod assist,+2 safety/equipment Gait Distance (Feet): 5 Feet Assistive device: Rolling walker (2 wheeled) Gait Pattern/deviations: Step-through pattern,Decreased step length - right,Decreased step length - left,Antalgic General Gait Details: pt initates gait with min A, with increased distance and weight bearing pt with increasing LE weakness and knee buckling requiring modA and then straight back chair brought behind her to sit Gait velocity: slowed    Posture / Balance Dynamic Sitting Balance Sitting balance - Comments: initially required UE support EOB but then progressed to no UE support Balance Overall balance assessment: Needs  assistance,History of Falls Sitting-balance support: Feet supported,Single extremity supported,Bilateral upper extremity supported Sitting balance-Leahy Scale: Fair Sitting balance - Comments: initially required UE support EOB but then progressed to no UE support Standing balance support: Bilateral upper extremity supported,During functional activity Standing balance-Leahy Scale: Poor Standing balance comment: reliant on UE support    Special needs/care consideration Skin Pressure injury stage 1: coccyx-mid, bilateral, Special service needs TLSO brace for out of bed activity and Designated visitor Shuna Tabor, husband   Previous Home Environment (from acute therapy documentation) Living Arrangements: Spouse/significant other  Lives With: Spouse Available Help at Discharge: [facility] PRN/intermittently Type  of Home: House Home Layout: Two level,Able to live on main level with bedroom/bathroom Home Access: Stairs to enter Entrance Stairs-Rails: None Entrance Stairs-Number of Steps: 2 Bathroom Shower/Tub: Ambulance person: Handicapped height Bathroom Accessibility: Yes How Accessible: Accessible via walker [place]: No  Discharge Living Setting Plans for Discharge Living Setting: Patient's home Type of Home at Discharge: House Discharge Home Layout: Two level,Able to live on main level with bedroom/bathroom Discharge Home Access: Stairs to enter Entrance Stairs-Rails: None Entrance Stairs-Number of Steps: 2 Discharge Bathroom Shower/Tub: Walk-in shower,Tub/shower unit Discharge Bathroom Toilet: Handicapped height Discharge Bathroom Accessibility: Yes How Accessible: Accessible via walker Does the patient have any problems obtaining your medications?: No  Social/Family/Support Systems Anticipated Caregiver: Devani Odonnel, husband Anticipated Caregiver's Contact Information: [phone] Discharge Plan Discussed with Primary  Caregiver: Yes Is Caregiver In Agreement with Plan?: Yes Does Caregiver/Family have Issues with Lodging/Transportation while Pt is in Rehab?: No  Goals Patient/Family Goal for Rehab: *** Expected length of stay: *** Pt/Family Agrees to Admission and willing to participate: Yes Program Orientation Provided & Reviewed with Pt/Caregiver Including Roles  & Responsibilities: Yes  Decrease burden of Care through IP rehab admission: NA  Possible need for SNF placement upon discharge: Not anticipated  Patient Condition: {PATIENT'S CONDITION:22832}  Preadmission Screen Completed By:  Bethel Born, 07/07/2020 12:39 PM ______________________________________________________________________   Discussed status with Dr. Marland Kitchen on *** at *** and received approval for admission today.  Admission Coordinator:  Bethel Born, CCC-SLP, time ***Sudie Grumbling ***   Assessment/Plan: Diagnosis: 1. Does the need for close, 24 hr/day Medical supervision in concert with the patient's rehab needs make it unreasonable for this patient to be served in a less intensive setting? {yes_no_potentially:3041433} 2. Co-Morbidities requiring supervision/potential complications: *** 3. Due to {due IR:4431540}, does the patient require 24 hr/day rehab nursing? {yes_no_potentially:3041433} 4. Does the patient require coordinated care of a physician, rehab nurse, PT, OT, and SLP to address physical and functional deficits in the context of the above medical diagnosis(es)? {yes_no_potentially:3041433} Addressing deficits in the following areas: {deficits:3041436} 5. Can the patient actively participate in an intensive therapy program of at least 3 hrs of therapy 5 days a week? {yes_no_potentially:3041433} 6. The potential for patient to make measurable gains while on inpatient rehab is {potential:3041437} 7. Anticipated functional outcomes upon discharge from inpatient rehab: {functional outcomes:304600100} PT,  {functional outcomes:304600100} OT, {functional outcomes:304600100} SLP 8. Estimated rehab length of stay to reach the above functional goals is: *** 9. Anticipated discharge destination: {anticipated dc setting:21604} 10. Overall Rehab/Functional Prognosis: {potential:3041437}   MD Signature: ***

## 2020-07-07 NOTE — Progress Notes (Signed)
Inpatient Rehab Admissions:  Inpatient Rehab Consult received.  I met with patient and husband, [name] at the bedside for rehabilitation assessment and to discuss goals and expectations of an inpatient rehab admission.  Both acknowledged understanding of goals and expectations. Pt interested in pursuing CIR.  Will continue to follow.  Signed: Gayland Curry, [place], [place] Admissions Coordinator [phone]

## 2020-07-07 NOTE — Progress Notes (Signed)
Chief Complaint: Patient was seen in consultation today for disc aspiration  Referring Physician(s): Dr. Wendee Beavers  Supervising Physician: Luanne Bras  Patient Status: [facility] - In-pt  History of Present Illness: [name] is a 87 y.o. female with back pain founf to have t12-L1 discitis with L1 fracture. There is concern for infectious component, therefore IR is asked to perform disc aspiration. She was started on IV abx, so ID wished to hold those prior to the biopsy. She is also now found to have aortic valve vegetation. Blood cultures have also been ordered. PMHx, meds, labs, imaging, allergies reviewed. Family at bedside.   Past Medical History:  Diagnosis Date  . Allergy   . Anemia, pernicious   . Arthritis    bursitis   . Atrophic gastritis   . B12 deficiency   . CAD (coronary artery disease)   . Carotid artery stenosis   . Cataract   . Diarrhea    on and off for months  . Family history of adverse reaction to anesthesia    extreme nausea brother and sister  . Fibromyalgia   . Gallbladder polyp   . GERD (gastroesophageal reflux disease)   . Heart murmur   . History of hiatal hernia   . History of kidney stones   . HLD (hyperlipidemia)   . HTN (hypertension)   . Hypothyroidism   . Irritable bowel syndrome (IBS)   . Median arcuate ligament syndrome ([place])   . Osteoporosis   . PVD (peripheral vascular disease) ([place])   . Tubular adenoma of colon 08/2013   with focal high grade dysplasia    Past Surgical History:  Procedure Laterality Date  . ABDOMINAL HYSTERECTOMY    . APPENDECTOMY    . BIOPSY THYROID    . CARDIOVERSION N/A 04/03/2020   Procedure: CARDIOVERSION;  Surgeon: Dorothy Spark, MD;  Location: [facility] ENDOSCOPY;  Service: Cardiovascular;  Laterality: N/A;  . cataract surgery    . CHOLECYSTECTOMY N/A 10/24/2014   Procedure: MICROSCOPIC  CHOLECYSTECTOMY;  Surgeon: Ralene Ok, MD;  Location: [place];  Service: General;  Laterality:  N/A;  . COLONOSCOPY    . CORONARY ARTERY BYPASS GRAFT  07  . ESOPHAGEAL MANOMETRY N/A 08/02/2012   Procedure: ESOPHAGEAL MANOMETRY (EM);  Surgeon: Sable Feil, MD;  Location: [facility] ENDOSCOPY;  Service: Endoscopy;  Laterality: N/A;  . ESOPHAGOGASTRODUODENOSCOPY (EGD) WITH PROPOFOL N/A 04/07/2019   Procedure: ESOPHAGOGASTRODUODENOSCOPY (EGD) WITH PROPOFOL;  Surgeon: Milus Banister, MD;  Location: [facility] ENDOSCOPY;  Service: Endoscopy;  Laterality: N/A;  . HEMOSTASIS CLIP PLACEMENT  04/07/2019   Procedure: HEMOSTASIS CLIP PLACEMENT;  Surgeon: Milus Banister, MD;  Location: [facility] ENDOSCOPY;  Service: Endoscopy;;  . HOT HEMOSTASIS N/A 04/07/2019   Procedure: HOT HEMOSTASIS (ARGON PLASMA COAGULATION/BICAP);  Surgeon: Milus Banister, MD;  Location: [facility] ENDOSCOPY;  Service: Endoscopy;  Laterality: N/A;  . POLYPECTOMY  04/07/2019   Procedure: POLYPECTOMY;  Surgeon: Milus Banister, MD;  Location: [facility] ENDOSCOPY;  Service: Endoscopy;;  . [name]  04/07/2019   Procedure: [name];  Surgeon: Milus Banister, MD;  Location: [facility] ENDOSCOPY;  Service: Endoscopy;;  . TEE WITHOUT CARDIOVERSION N/A 04/03/2020   Procedure: TRANSESOPHAGEAL ECHOCARDIOGRAM (TEE);  Surgeon: Dorothy Spark, MD;  Location: [facility] ENDOSCOPY;  Service: Cardiovascular;  Laterality: N/A;  . THYROID LOBECTOMY    . UPPER GASTROINTESTINAL ENDOSCOPY      Allergies: Actos [pioglitazone], Sulfonamide derivatives, Adhesive [tape], and Codeine  Medications:  Current Facility-Administered Medications:  .  acetaminophen (TYLENOL)  tablet 1,000 mg, 1,000 mg, Oral, Q8H, Nicole Kindred A, DO, 1,000 mg at 07/07/20 0605 .  cefTRIAXone (ROCEPHIN) 2 g in sodium chloride 0.9 % 100 mL IVPB, 2 g, Intravenous, Daily, Gonfa, Taye T, MD, Last Rate: 200 mL/hr at 07/07/20 0923, 2 g at 07/07/20 0923 .  Chlorhexidine Gluconate Cloth 2 % PADS 6 each, 6 each, Topical, Daily, Lequita Halt, MD, 6 each at 07/07/20 1239 .  DAPTOmycin (CUBICIN) 365 mg in  sodium chloride 0.9 % IVPB, 8 mg/kg, Intravenous, Q48H, Henri Medal, RPH, Last Rate: 214.6 mL/hr at 07/07/20 1136, 365 mg at 07/07/20 1136 .  feeding supplement (BOOST / RESOURCE BREEZE) liquid 1 Container, 1 Container, Oral, TID BM, Nicole Kindred A, DO, 1 Container at 07/06/20 1316 .  HYDROmorphone (DILAUDID) injection 0.5 mg, 0.5 mg, Intravenous, Q2H PRN, Nicole Kindred A, DO .  hyoscyamine (LEVSIN SL) SL tablet 0.125 mg, 0.125 mg, Sublingual, Q6H PRN, Wynetta Fines T, MD .  ipratropium (ATROVENT) 0.06 % nasal spray 1 spray, 1 spray, Each Nare, Q12H, Wynetta Fines T, MD, 1 spray at 07/06/20 1317 .  labetalol (NORMODYNE) tablet 100 mg, 100 mg, Oral, TID PRN, Wynetta Fines T, MD .  lactase (LACTAID) tablet 3,000 Units, 3,000 Units, Oral, TID WC PRN, Wynetta Fines T, MD .  lactose free nutrition (BOOST PLUS) liquid 237 mL, 237 mL, Oral, TID WC, Gonfa, Taye T, MD, 237 mL at 07/07/20 1202 .  levothyroxine (SYNTHROID) tablet 50 mcg, 50 mcg, Oral, QHS, Lequita Halt, MD, 50 mcg at 07/06/20 2220 .  lidocaine (LIDODERM) 5 % 1 patch, 1 patch, Transdermal, Daily PRN, Lequita Halt, MD, 1 patch at 07/05/20 1327 .  lidocaine (LIDODERM) 5 % 1 patch, 1 patch, Transdermal, Q24H, Nicole Kindred A, DO, 1 patch at 07/06/20 1543 .  multivitamin with minerals tablet 1 tablet, 1 tablet, Oral, Daily, Ezekiel Slocumb, DO, 1 tablet at 07/07/20 0931 .  oxyCODONE (Oxy IR/ROXICODONE) immediate release tablet 5 mg, 5 mg, Oral, Q4H PRN, Nicole Kindred A, DO, 5 mg at 07/06/20 0929 .  polyethylene glycol (MIRALAX / GLYCOLAX) packet 17 g, 17 g, Oral, Daily, Wynetta Fines T, MD, 17 g at 07/05/20 0929 .  sodium chloride (OCEAN) 0.65 % nasal spray 1 spray, 1 spray, Each Nare, TID PRN, Wynetta Fines T, MD .  sucralfate (CARAFATE) 1 GM/10ML suspension 1 g, 1 g, Oral, BID PRN, Wynetta Fines T, MD .  traMADol Veatrice Bourbon) tablet 50 mg, 50 mg, Oral, BID PRN, Wynetta Fines T, MD, 50 mg at 07/07/20 1159    Family History  Problem Relation Age of  Onset  . Heart disease Mother   . Cholelithiasis Mother   . Prostate cancer Brother   . Colon cancer Brother   . Breast cancer Other        niece - breast cancer  . Diabetes Sister   . Lung cancer Father   . Lung cancer Sister   . Stomach cancer Cousin   . Esophageal cancer Neg Hx   . Rectal cancer Neg Hx     Social History   Socioeconomic History  . Marital status: Married    Spouse name: [name]  . Number of children: 0  . Years of education: 38  . Highest education level: Not on file  Occupational History  . Occupation: retired    Fish farm manager: RETIRED  Tobacco Use  . Smoking status: Never Smoker  . Smokeless tobacco: Never Used  Vaping Use  . Vaping Use: Never  used  Substance and Sexual Activity  . Alcohol use: No    Alcohol/week: 0.0 standard drinks  . Drug use: No  . Sexual activity: Not on file  Other Topics Concern  . Not on file  Social History Narrative   [name], Married. Lives with husband. Had been a very active woman.   Social Determinants of Health   Financial Resource Strain: Low Risk   . Difficulty of Paying Living Expenses: Not hard at all  Food Insecurity: Not on file  Transportation Needs: No Transportation Needs  . Lack of Transportation (Medical): No  . Lack of Transportation (Non-Medical): No  Physical Activity: Not on file  Stress: Not on file  Social Connections: Not on file     Review of Systems: A 12 point ROS discussed and pertinent positives are indicated in the HPI above.  All other systems are negative.  Review of Systems  Vital Signs: BP (!) 103/50 (BP Location: Left Arm)   Pulse 97   Temp 98.5 F (36.9 C) (Oral)   Resp 19   Ht 5\' 4"  (1.626 m)   Wt 45.6 kg   SpO2 95%   BMI 17.26 kg/m   Physical Exam Constitutional:      Appearance: Normal appearance.  Cardiovascular:     Rate and Rhythm: Normal rate. Rhythm irregular.  Pulmonary:     Effort: Pulmonary effort is normal. No respiratory distress.     Breath sounds:  Normal breath sounds.  Skin:    General: Skin is warm and dry.  Neurological:     General: No focal deficit present.     Mental Status: She is alert and oriented to person, place, and time.  Psychiatric:        Mood and Affect: Mood normal.        Thought Content: Thought content normal.        Judgment: Judgment normal.      Imaging: DG Chest 2 View  Result Date: 07/04/2020 CLINICAL DATA:  Syncope EXAM: CHEST - 2 VIEW COMPARISON:  April 01, 2018 FINDINGS: There is an equivocal pleural effusion on each side with mild bibasilar atelectasis. No edema or airspace opacity. Heart is mildly enlarged with pulmonary vascularity normal. Patient is status post coronary artery bypass grafting. No adenopathy. There are foci of degenerative change in the thoracic spine. IMPRESSION: Mild bibasilar atelectasis with equivocal pleural effusion on each side. No airspace opacity or edema evident. Heart mildly enlarged. Status post coronary artery bypass grafting. Electronically Signed   By: Lowella Grip III M.D.   On: 07/04/2020 08:57   DG Ankle Complete Left  Result Date: 07/04/2020 CLINICAL DATA:  Fall, left ankle pain EXAM: LEFT ANKLE COMPLETE - 3+ VIEW COMPARISON:  None. FINDINGS: Three view radiograph left ankle demonstrates normal alignment. No acute fracture or dislocation. Ankle mortise is intact. No ankle effusion. Small plantar calcaneal spur. Mild bimalleolar soft tissue swelling. Diffuse subcutaneous edema noted involving the visualized left foreleg and left forefoot. IMPRESSION: Soft tissue swelling.  No fracture or dislocation. Electronically Signed   By: Fidela Salisbury MD   On: 07/04/2020 06:48   CT HEAD WO CONTRAST  Result Date: 07/04/2020 CLINICAL DATA:  87 year old female with dizziness.  Fall at home. EXAM: CT HEAD WITHOUT CONTRAST TECHNIQUE: Contiguous axial images were obtained from the base of the skull through the vertex without intravenous contrast. COMPARISON:  [place]   Head CT 07/20/2019. FINDINGS: Brain: Stable cerebral volume from last year, generalized volume loss. No midline  shift, ventriculomegaly, mass effect, evidence of mass lesion, intracranial hemorrhage or evidence of cortically based acute infarction. Gray-white matter differentiation is within normal limits throughout the brain. Dystrophic basal ganglia calcifications. No focal encephalomalacia identified. Vascular: Calcified atherosclerosis at the skull base. No suspicious intracranial vascular hyperdensity. Skull: Stable, intact. Sinuses/Orbits: Chronic left frontal sinus opacification with small osteoma along the posterior left frontoethmoidal recess, unchanged. Other Visualized paranasal sinuses and mastoids are clear. Other: No orbit or scalp soft tissue injury identified. IMPRESSION: 1. No acute intracranial abnormality or acute traumatic injury identified. Stable generalized cerebral volume loss. 2. Chronic left frontal sinus disease. Electronically Signed   By: Genevie Ann M.D.   On: 07/04/2020 07:19   MR LUMBAR SPINE WO CONTRAST  Result Date: 07/04/2020 CLINICAL DATA:  Fall at home. EXAM: MRI LUMBAR SPINE WITHOUT CONTRAST TECHNIQUE: Multiplanar, multisequence MR imaging of the lumbar spine was performed. No intravenous contrast was administered. COMPARISON:  CT abdomen pelvis 07/04/2020. MRI lumbar spine 01/26/2020 FINDINGS: Segmentation:  Normal Alignment: 5 mm retrolisthesis T12-L1 which has progressed since the prior MRI. 3 mm anterolisthesis L4-5 unchanged. Vertebrae: Progressive fracture superior endplate of L1, left greater than right. There is associated fracture of the inferior endplate of Y07 right greater than left. There is bone marrow edema throughout the T12 and L1 vertebral bodies. There is endplate irregularity which has progressed from the prior MRI. Mild fluid lateral to the T12-L1 disc space on the right. Retropulsion of bone into the canal with mild spinal stenosis. No other fracture  identified. Conus medullaris and cauda equina: Conus extends to the L1-2 level. Conus and cauda equina appear normal. Paraspinal and other soft tissues: Mild paravertebral edema on the right at T12-L1. No fluid collection or epidural abscess. Negative for paravertebral mass. Disc levels: T12-L1: Fracture of T12 and L1 as above with retropulsion of bone into the canal. No significant spinal stenosis. L1-2: Negative L2-3: Mild disc degeneration and mild facet degeneration. No significant spinal stenosis L3-4: Disc degeneration with diffuse endplate spurring and bilateral facet degeneration. Mild to moderate subarticular stenosis bilaterally L4-5: 3 mm anterolisthesis. Diffuse disc bulging. Central and right-sided disc protrusion unchanged. Severe facet degeneration. Moderate to severe spinal stenosis. Severe subarticular and foraminal stenosis on the right. Moderate subarticular stenosis on the left L5-S1: Mild disc and facet degeneration.  Negative for stenosis. IMPRESSION: Moderate fractures of T12 and L1 as noted on CT earlier today. This has progressed since MRI of 01/26/2020. There is diffuse bone marrow edema throughout T12 and L1. There is mild paravertebral edema in the soft tissues on the right. There is retropulsion of bone into the canal without significant spinal stenosis. No epidural abscess however there is concern infection is present in the disc space. This could be an indolent fracture or possibly superimposed infection that has occurred since the MRI of 01/26/2020. Alternatively, it is possible this is a sterile process with progressive mechanical fracture T12-L1 Multilevel degenerative change. Moderate to severe spinal stenosis L4-5 with severe subarticular and foraminal stenosis on the right. This is unchanged from the earlier MRI. Electronically Signed   By: Franchot Gallo M.D.   On: 07/04/2020 14:49   CT ABDOMEN PELVIS W CONTRAST  Result Date: 07/04/2020 CLINICAL DATA:  syncopal episode this  morning and fell in the bathroom. Now having right sided abdominal tenderness EXAM: CT ABDOMEN AND PELVIS WITH CONTRAST TECHNIQUE: Multidetector CT imaging of the abdomen and pelvis was performed using the standard protocol following bolus administration of intravenous contrast. CONTRAST:  50mL  OMNIPAQUE IOHEXOL 300 MG/ML  SOLN COMPARISON:  CT abdomen and pelvis January 25, 2019. FINDINGS: Lower chest: Bibasilar atelectasis. Thoracic aortic atherosclerosis. Small hiatal hernia. Hepatobiliary: No suspicious hepatic lesion. Gallbladder surgically absent. Similar prominence of the biliary tree, likely reservoir effect post cholecystectomy. Pancreas: Mild dorsal duct distension which is stable in comparison to prior studies. No focal pancreatic lesion. Spleen: No splenic injury or perisplenic hematoma. Adrenals/Urinary Tract: Bilateral adrenal glands are unremarkable. No hydronephrosis. Mild urothelial thickening at left-sided extrarenal pelvis. Nonobstructive 4 mm left lower pole renal stone. Bilateral low-density renal lesions are unchanged in comparison to prior and likely cysts. Urinary bladder is moderately distended without suspicious wall thickening. Stomach/Bowel: Stomach is predominantly decompressed limiting evaluation. No suspicious small bowel wall thickening or dilation. The appendix is not definitely visualized however there is no inflammatory stranding along the cecum to suggest acute inflammation. Moderate volume of formed stool throughout the colon. Sigmoid colonic diverticulosis without findings of acute diverticulitis. Vascular/Lymphatic: Aortic atherosclerosis. No enlarged abdominal or pelvic lymph nodes. Reproductive: Status post hysterectomy. No adnexal masses. Other: No abdominopelvic ascites. Musculoskeletal: New anterior compression deformity of the L1 vertebral body with approximately 50% height loss and mild retropulsion of bone and disc into the spinal canal with narrowing of the canal.  Global demineralization of bone. Multilevel degenerative changes spine. Degenerative changes bilateral hips. IMPRESSION: 1. New anterior compression deformity of the L1 vertebral body with approximately 50% height loss and mild retropulsion of bone and disc into the spinal canal with moderate narrowing of the canal at this level. 2. Apparent mild urothelial thickening of a left-sided extrarenal pelvis may reflect ascending urinary tract infection. Correlate with urinalysis. 3. Nonobstructive left nephrolithiasis. 4. Sigmoid colonic diverticulosis without findings of acute diverticulitis. 5. Moderate volume of formed stool throughout the colon. 6. Aortic atherosclerosis.  Aortic Atherosclerosis (ICD10-I70.0). Electronically Signed   By: Dahlia Bailiff MD   On: 07/04/2020 10:04   ECHOCARDIOGRAM COMPLETE  Result Date: 07/06/2020    ECHOCARDIOGRAM REPORT   Patient Name:   BARRI NEIDLINGER Date of Exam: 07/06/2020 Medical Rec #:  [id number]        Height:       64.0 in Accession #:    [id number]       Weight:       100.5 lb Date of Birth:  [birth year]         BSA:          1.460 m Patient Age:    51 years         BP:           127/58 mmHg Patient Gender: F                HR:           95 bpm. Exam Location:  Inpatient Procedure: 2D Echo, Cardiac Doppler and Color Doppler            REPORT CONTAINS CRITICAL RESULT  Results communicated to Dr Cyndia Skeeters at 18:56 on 07/06/20. Indications:    R55 Syncope  History:        Patient has prior history of Echocardiogram examinations, most                 recent 04/03/2020. Prior CABG; Mitral Valve Disease.  Sonographer:    Merrie Roof RDCS Referring Phys: [place]  1. Left ventricular ejection fraction, by estimation, is 40 to 45%. The left ventricle has mildly decreased function. The left  ventricle demonstrates regional wall motion abnormalities. Abnormal (paradoxical) septal motion, consistent with left bundle branch block There is mild left ventricular  hypertrophy. Left ventricular diastolic parameters are indeterminate.  2. Right ventricular systolic function is mildly reduced. The right ventricular size is normal. There is moderately elevated pulmonary artery systolic pressure. The estimated right ventricular systolic pressure is 16.1 mmHg.  3. Left atrial size was moderately dilated.  4. Right atrial size was mildly dilated.  5. The mitral valve is abnormal. Moderate to severe mitral valve regurgitation.  6. There is an aortic valve vegetation measuring 1.5cm x 0.5cm. The aortic valve is tricuspid. No aortic stenosis is present. Increased gradients through AV likely due to AI, visually does appear stenotic. Aortic valve regurgitation is moderate to severe. FINDINGS  Left Ventricle: Left ventricular ejection fraction, by estimation, is 40 to 45%. The left ventricle has mildly decreased function. The left ventricle demonstrates regional wall motion abnormalities. The left ventricular internal cavity size was normal in size. There is mild left ventricular hypertrophy. Abnormal (paradoxical) septal motion, consistent with left bundle branch block. Left ventricular diastolic parameters are indeterminate. Right Ventricle: The right ventricular size is normal. Right vetricular wall thickness was not well visualized. Right ventricular systolic function is mildly reduced. There is moderately elevated pulmonary artery systolic pressure. The tricuspid regurgitant velocity is 3.30 m/s, and with an assumed right atrial pressure of 3 mmHg, the estimated right ventricular systolic pressure is 09.6 mmHg. Left Atrium: Left atrial size was moderately dilated. Right Atrium: Right atrial size was mildly dilated. Pericardium: Trivial pericardial effusion is present. Mitral Valve: The mitral valve is abnormal. Moderate to severe mitral valve regurgitation. Tricuspid Valve: The tricuspid valve is normal in structure. Tricuspid valve regurgitation is mild. Aortic Valve: Aortic valve  vegetation measuring 1.5cm x 0.5 cm. The aortic valve is tricuspid. Aortic valve regurgitation is severe. Aortic regurgitation PHT measures 475 msec. No aortic stenosis is present. Aortic valve mean gradient measures 9.0 mmHg. Aortic valve peak gradient measures 17.0 mmHg. Aortic valve area, by VTI measures 0.84 cm. Pulmonic Valve: The pulmonic valve was grossly normal. Pulmonic valve regurgitation is not visualized. Aorta: The aortic root is normal in size and structure. IAS/Shunts: The interatrial septum was not well visualized.  LEFT VENTRICLE PLAX 2D LVIDd:         3.80 cm  Diastology LVIDs:         2.70 cm  LV e' medial:  6.64 cm/s LV PW:         1.00 cm  LV e' lateral: 10.30 cm/s LV IVS:        1.00 cm LVOT diam:     1.70 cm LV SV:         32 LV SV Index:   22 LVOT Area:     2.27 cm  RIGHT VENTRICLE RV Basal diam:  3.40 cm LEFT ATRIUM             Index       RIGHT ATRIUM           Index LA diam:        3.10 cm 2.12 cm/m  RA Area:     16.90 cm LA Vol (A2C):   56.0 ml 38.34 ml/m RA Volume:   45.30 ml  31.02 ml/m LA Vol (A4C):   59.5 ml 40.74 ml/m LA Biplane Vol: 63.1 ml 43.21 ml/m  AORTIC VALVE AV Area (Vmax):    1.13 cm AV Area (Vmean):   0.95 cm AV  Area (VTI):     0.84 cm AV Vmax:           206.00 cm/s AV Vmean:          140.000 cm/s AV VTI:            0.380 m AV Peak Grad:      17.0 mmHg AV Mean Grad:      9.0 mmHg LVOT Vmax:         103.00 cm/s LVOT Vmean:        58.500 cm/s LVOT VTI:          0.141 m LVOT/AV VTI ratio: 0.37 AI PHT:            475 msec  AORTA Ao Root diam: 2.70 cm Ao Asc diam:  2.40 cm MR Peak grad:    86.5 mmHg   TRICUSPID VALVE MR Mean grad:    46.0 mmHg   TR Peak grad:   43.6 mmHg MR Vmax:         465.00 cm/s TR Vmax:        330.00 cm/s MR Vmean:        307.0 cm/s MR PISA:         2.26 cm    SHUNTS MR PISA Eff ROA: 16 mm      Systemic VTI:  0.14 m MR PISA Radius:  0.60 cm     Systemic Diam: 1.70 cm Oswaldo Milian MD Electronically signed by Oswaldo Milian MD  Signature Date/Time: 07/06/2020/7:15:52 PM    Final     Labs:  CBC: Recent Labs    07/04/20 4627 07/05/20 0428 07/06/20 0052 07/07/20 0358  WBC 17.6* 14.6* 10.7* 9.9  HGB 12.1 12.3 10.0* 11.0*  HCT 37.0 37.7 29.3* 32.3*  PLT 223 209 179 153    COAGS: Recent Labs    07/25/19 2056 07/07/20 0358  INR 1.2 1.7*    BMP: Recent Labs    09/22/19 1415 10/27/19 1522 03/21/20 1307 04/30/20 1412 05/23/20 1431 07/04/20 0627 07/05/20 0428 07/06/20 0052 07/07/20 0358  NA 140   < > 139 139 139 130* 133* 130* 129*  K 3.7   < > 4.8 4.4 4.9 4.6 4.6 4.6 5.1  CL 104   < > 101 104 105 98 106 105 103  CO2 26   < > 24 29 20  21* 18* 19* 22  GLUCOSE 107*   < > 86 119* 134* 114* 94 180* 99  BUN 21   < > 13 23 27  47* 37* 41* 39*  CALCIUM 8.8*   < > 9.0 8.7 8.7 8.7* 7.8* 7.4* 7.9*  CREATININE 1.04*   < > 0.76 1.02* 1.10* 1.48* 1.25* 1.34* 1.31*  GFRNONAA 49*  --  72 50* 46* 34* 42* 39* 40*  GFRAA 57*  --  83 58* 53*  --   --   --   --    < > = values in this interval not displayed.    LIVER FUNCTION TESTS: Recent Labs    07/04/20 0756 07/05/20 0428 07/06/20 0052 07/07/20 0358  BILITOT 1.0 0.9 0.7 0.9  AST 286* 350* 260* 139*  ALT 279* 333* 276* 208*  ALKPHOS 81 85 78 81  PROT 6.8 6.1* 4.8* 5.0*  ALBUMIN 2.6* 2.3* 1.9* 2.0*    TUMOR MARKERS: No results for input(s): AFPTM, CEA, CA199, CHROMGRNA in the last 8760 hours.  Assessment and Plan: T12-L1 discitis Aortic valve vegetation Blood cx pending Eliquis on hold Tentative plan  for image guided disc aspiration on Mon 3/14 unless determined unneccessary by ID IR will follow along and make preparations for Monday Risks and benefits of disc aspiration was discussed with the patient and/or patient's family including, but not limited to bleeding, infection, damage to adjacent structures or low yield requiring additional tests.  All of the questions were answered and there is agreement to proceed.   Thank you for this  interesting consult.  I greatly enjoyed meeting TREESA MCCULLY and look forward to participating in their care.  A copy of this report was sent to the requesting provider on this date.  Electronically Signed: Ascencion Dike, PA-C 07/07/2020, 1:06 PM   I spent a total of 15 minutes in face to face in clinical consultation, greater than 50% of which was counseling/coordinating care for disc aspiration

## 2020-07-07 NOTE — Progress Notes (Signed)
Pharmacy Antibiotic Note  [name] is a 87 y.o. female admitted on 07/04/2020 . Patient with progressive T12-L1 fractures concern for possible discitis. TTE found aortic valve vegetation. Pharmacy has been consulted for daptomycin dosing per ID recommendation. Scr 1.31 with current CrCl ~23 ml/min. CK 136 on 3/9.   Plan: Daptomycin 8mg /kg q48 for CrCl 10-91ml/min Monitor renal function, cultures, CK and clinical progression   Height: 5\' 4"  (162.6 cm) Weight: 45.6 kg (100 lb 8.5 oz) IBW/kg (Calculated) : 54.7  Temp (24hrs), Avg:98.3 F (36.8 C), Min:98.1 F (36.7 C), Max:98.5 F (36.9 C)  Recent Labs  Lab 07/04/20 0627 07/05/20 0428 07/06/20 0052 07/07/20 0358  WBC 17.6* 14.6* 10.7* 9.9  CREATININE 1.48* 1.25* 1.34* 1.31*    Estimated Creatinine Clearance: 22.6 mL/min (A) (by C-G formula based on SCr of 1.31 mg/dL (H)).    Allergies  Allergen Reactions  . Actos [Pioglitazone] Swelling    Ankle swelling  . Sulfonamide Derivatives Swelling  . Adhesive [Tape] Other (See Comments)    WITH PROLONGED EXPOSURE (REDNESS/ITCHING)  . Codeine Nausea And Vomiting    Antimicrobials this admission: Ceftriaxone 1g q24h 3/9 >> 3/11 Ceftriaxone 2g q24h 3/12 >>  Daptomycin 3/12 >>  Dose adjustments this admission: N/a  Microbiology results: 3/9 Ucx: 100k e. Coli (R - Bactrim) 3/11 Bcx:   Thank you for allowing pharmacy to be a part of this patient's care.  Cristela Felt, PharmD Clinical Pharmacist  07/07/2020 8:15 AM

## 2020-07-07 NOTE — Progress Notes (Signed)
PROGRESS NOTE  [name] FWY:[id number] DOB: 01/27/1933   PCP: [name], MD  Patient is from: Home.  Lately uses walker for ambulation.  DOA: 07/04/2020 LOS: 3  Chief complaints: Fall at home  Brief Narrative / Interim history: 87 year old F with PMH of chronic back pain, DJD, disc herniation, chronic ambulatory dysfunction, CAD-CABG, chronic A. fib on Eliquis, HLD, aortic valve and mitral valve regurgitation presented to ED after fall at home with possible concern for syncope.  In ED, CT head without acute finding.  CT abdomen and pelvis showed acute L1 compression fracture with approximately 50% height loss and mild retropulsion with moderate narrowing of canal, mild urothelial thickening of left-sided external pelvis and moderate volume stool.  Neurosurgery consulted. MRI lumbar spine showed moderate and progressive T12 and L1 fractures with diffuse bone marrow edema throughout T12 and L1 with retropulsion without no significant spinal stenosis but concern about possible disc space infection.  Patient also had AKI with elevated liver enzymes.  Urinalysis concerning for UTI but no acute bladder habit change.  Completed 3 days of ceftriaxone for possible UTI.  Infectious disease and IR consulted for possible discitis.  She also have some inflammatory markers.   TTE showed aortic valve vegetation.  Blood cultures obtained.  Started on ceftriaxone and daptomycin.  Cardiothoracic surgery consulted.  Subjective: Seen and examined earlier this morning.  No major events overnight of this morning other than lack of sleep due to multiple interruptions.  Pain fairly controlled lying down in bed.  No new focal neuro deficit.  She says she was finally able to void.  She reports some diarrhea but denies abdominal pain.   Objective: Vitals:   07/06/20 1450 07/06/20 2030 07/06/20 2040 07/07/20 0425  BP: (!) 127/58 98/66  (!) 103/50  Pulse: 93 (!) 101 98 97  Resp: 20 20 20 19   Temp: 98.2  F (36.8 C) 98.1 F (36.7 C)  98.5 F (36.9 C)  TempSrc: Oral Oral  Oral  SpO2: 96% 96% 95% 95%  Weight:      Height:        Intake/Output Summary (Last 24 hours) at 07/07/2020 1358 Last data filed at 07/07/2020 0600 Gross per 24 hour  Intake --  Output 400 ml  Net -400 ml   Filed Weights   07/04/20 0620 07/05/20 0200  Weight: 40 kg 45.6 kg    Examination:  GENERAL: No apparent distress.  Nontoxic. HEENT: MMM.  Vision and hearing grossly intact.  NECK: Supple.  No apparent JVD.  RESP: On RA.  No IWOB.  Fair aeration bilaterally. CVS:  RRR. Heart sounds normal.  ABD/GI/GU: BS+. Abd soft, NTND.  MSK/EXT:  Moves extremities. No apparent deformity. No edema.  SKIN: no apparent skin lesion or wound NEURO: Awake, alert and oriented appropriately.  Motor 3+/5 in RLE and 5/5 in LLE.  Light sensation symmetric and intact.  Patellar reflexes symmetric. PSYCH: Calm. Normal affect.   Procedures:  None  Microbiology summarized: [id number] and influenza PCR nonreactive. Urine cultures with pansensitive E. coli except to Bactrim Blood cultures pending.  Assessment & Plan: Fall at home-unknown mechanism but felt lightheaded when she got up to sit on bedside chair.  She has poor p.o. intake and weakness for sometimes. No event on telemetry yet.  TTE showing AV vegetation with severe AVR, RWMA, moderate to severe MVR.  -Orthostatic vitals if able to stand -OOB/PT/OT -Continue fall precautions  Aortic valve vegetation with severe AVR/moderate to severe MVR-noted on TTE.  AVR and MVR seems to be chronic. -Blood cultures drawn but after three doses of ceftriaxone for UTI -Started on daptomycin and ceftriaxone per ID -Cardiothoracic surgery consulted  Progressive T12-L1 fractures with mild retropulsion and possible discitis.   History of lumbar osteoarthritis and discoordination -Neurosurgery recommended conservative management -Has slightly elevated inflammatory markers. -IR and ID  consulted for possible discitis which is now more likely in the setting of aortic vegetation -Pain control with scheduled Tylenol, lidocaine patch, as needed tramadol, oxycodone and Dilaudid  AKI with azotemia: Likely prerenal.  Also on Aldactone and losartan at home. Recent Labs    07/25/19 2056 09/22/19 1415 10/27/19 1522 03/21/20 1307 04/30/20 1412 05/23/20 1431 07/04/20 0627 07/05/20 0428 07/06/20 0052 07/07/20 0358  BUN 19 21 19 13 23 27  47* 37* 41* 39*  CREATININE 0.97 1.04* 0.85 0.76 1.02* 1.10* 1.48* 1.25* 1.34* 1.31*  -Continue holding Aldactone and losartan -Discontinued IV fluid -Continue monitoring  Acute on chronic transaminitis: Improving.  CK within normal.  She is on amiodarone and statin which could contribute -Continue holding amiodarone and statin.   Elevated troponin: Likely demand ischemia.  No chest pain.  TTE findings as above.  She has RWMA which was not noted on prior TTE or TEE.  -Consulted cardiology   Chronic A. fib: Rate controlled.  On amiodarone and Eliquis at home -Eliquis on hold pending possible IR procedure.  Last dose 07/06/2020 -Amiodarone on hold in the setting of LFT -Continue telemetry monitoring -Start IV heparin for anticoagulation  Chronic systolic CHF: TTE with LVEF of 40 to 45% (45 to 50% previously) and RWMA and valvular lesion as above.  No cardiopulmonary symptoms but fatigue.  Not on diuretics.  Appears euvolemic. -Monitor fluid status and respiratory status  Hyponatremia: Na 129.  Due to AKI?  She was also on Aldactone and losartan at home which could contribute.  TSH elevated to 11.  A.m. cortisol appropriately high. -Repeat TSH in the morning.  -Follow urine sodium and osmolality -Continue holding Aldactone and losartan -Continue monitoring  Metabolic acidosis: Likely due to IV fluid.  Resolved.  E. coli UTI-no new urinary symptoms -on ceftriaxone  Normocytic anemia: Dilutional? Recent Labs    07/25/19 2056  09/22/19 1415 10/27/19 1522 03/21/20 1307 04/30/20 1412 05/23/20 1431 07/04/20 0627 07/05/20 0428 07/06/20 0052 07/07/20 0358  HGB 13.2 14.0 13.2 11.5 10.8* 11.4 12.1 12.3 10.0* 11.0*  -Continue monitoring  Hypothyroidism: TSH 11.1 -Continue home Synthroid 50 mcg daily -Need repeat TSH in 4 to 6 weeks outpatient before adjusting.  Essential hypertension: Soft blood pressures without meds. -Continue holding home meds  History of atrophic gastritis -Continue Protonix and Carafate  Peripheral neuropathy -Continue home gabapentin  Mild leukocytosis: Resolved. -Continue monitoring  Debility/physical deconditioning -PT/OT-recommended CIR.  Is a  Underweight/severe malnutrition: As demonstrated by hypoalbuminemia and significant muscle mass and subcu fat loss Body mass index is 17.26 kg/m. Nutrition Problem: Severe Malnutrition Etiology: chronic illness (chronic lower back pain 2/2 lumbar spine OA) Signs/Symptoms: energy intake < or equal to 75% for > or equal to 1 month,severe fat depletion,severe muscle depletion,percent weight loss Percent weight loss: 6 % Interventions: Ensure Enlive (each supplement provides 350kcal and 20 grams of protein),MVI,Magic cup   Pressure skin injury: POA Pressure Injury 07/05/20 Coccyx Mid;Bilateral Stage 1 -  Intact skin with non-blanchable redness of a localized area usually over a bony prominence. RED/NON BLANCHING (Active)  07/05/20 0352  Location: Coccyx  Location Orientation: Mid;Bilateral  Staging: Stage 1 -  Intact skin  with non-blanchable redness of a localized area usually over a bony prominence.  Wound Description (Comments): RED/NON BLANCHING  Present on Admission: Yes   DVT prophylaxis:  SCD Holding Eliquis  Code Status: Full code Family Communication: Updated patient's husband at bedside Level of care: Telemetry Medical Status is: Inpatient  Remains inpatient appropriate because:Ongoing active pain requiring inpatient  pain management, Ongoing diagnostic testing needed not appropriate for outpatient work up, Unsafe d/c plan, IV treatments appropriate due to intensity of illness or inability to take PO and Inpatient level of care appropriate due to severity of illness   Dispo: The patient is from: Home              Anticipated d/c is to: CIR              Patient currently is not medically stable to d/c.   Difficult to place patient No       Consultants:  Neurosurgery Infectious disease Interventional radiology Cardiothoracic surgery Cardiology   Sch Meds:  Scheduled Meds: . acetaminophen  1,000 mg Oral Q8H  . Chlorhexidine Gluconate Cloth  6 each Topical Daily  . feeding supplement  1 Container Oral TID BM  . ipratropium  1 spray Each Nare Q12H  . lactose free nutrition  237 mL Oral TID WC  . levothyroxine  50 mcg Oral QHS  . lidocaine  1 patch Transdermal Q24H  . multivitamin with minerals  1 tablet Oral Daily  . polyethylene glycol  17 g Oral Daily   Continuous Infusions: . cefTRIAXone (ROCEPHIN)  IV 2 g (07/07/20 0923)  . DAPTOmycin (CUBICIN)  IV 365 mg (07/07/20 1136)   PRN Meds:.HYDROmorphone (DILAUDID) injection, hyoscyamine, labetalol, lactase, lidocaine, oxyCODONE, sodium chloride, sucralfate, traMADol  Antimicrobials: Anti-infectives (From admission, onward)   Start     Dose/Rate Route Frequency Ordered Stop   07/07/20 0915  DAPTOmycin (CUBICIN) 365 mg in sodium chloride 0.9 % IVPB        8 mg/kg  45.6 kg 214.6 mL/hr over 30 Minutes Intravenous Every 48 hours 07/07/20 0824     07/07/20 0900  cefTRIAXone (ROCEPHIN) 2 g in sodium chloride 0.9 % 100 mL IVPB        2 g 200 mL/hr over 30 Minutes Intravenous Daily 07/07/20 0812     07/05/20 1030  cefTRIAXone (ROCEPHIN) 1 g in sodium chloride 0.9 % 100 mL IVPB  Status:  Discontinued        1 g 200 mL/hr over 30 Minutes Intravenous Every 24 hours 07/04/20 1326 07/06/20 1133   07/04/20 1030  cefTRIAXone (ROCEPHIN) 1 g in sodium  chloride 0.9 % 100 mL IVPB        1 g 200 mL/hr over 30 Minutes Intravenous  Once 07/04/20 1025 07/04/20 1107       I have personally reviewed the following labs and images: CBC: Recent Labs  Lab 07/04/20 0627 07/05/20 0428 07/06/20 0052 07/07/20 0358  WBC 17.6* 14.6* 10.7* 9.9  HGB 12.1 12.3 10.0* 11.0*  HCT 37.0 37.7 29.3* 32.3*  MCV 84.7 84.2 83.5 82.4  PLT 223 209 179 153   BMP &GFR Recent Labs  Lab 07/04/20 0627 07/05/20 0428 07/06/20 0052 07/07/20 0358  NA 130* 133* 130* 129*  K 4.6 4.6 4.6 5.1  CL 98 106 105 103  CO2 21* 18* 19* 22  GLUCOSE 114* 94 180* 99  BUN 47* 37* 41* 39*  CREATININE 1.48* 1.25* 1.34* 1.31*  CALCIUM 8.7* 7.8* 7.4* 7.9*  MG  --   --   --  2.0  PHOS  --   --   --  2.8   Estimated Creatinine Clearance: 22.6 mL/min (A) (by C-G formula based on SCr of 1.31 mg/dL (H)). Liver & Pancreas: Recent Labs  Lab 07/04/20 0756 07/05/20 0428 07/06/20 0052 07/07/20 0358  AST 286* 350* 260* 139*  ALT 279* 333* 276* 208*  ALKPHOS 81 85 78 81  BILITOT 1.0 0.9 0.7 0.9  PROT 6.8 6.1* 4.8* 5.0*  ALBUMIN 2.6* 2.3* 1.9* 2.0*   No results for input(s): LIPASE, AMYLASE in the last 168 hours. No results for input(s): AMMONIA in the last 168 hours. Diabetic: No results for input(s): HGBA1C in the last 72 hours. Recent Labs  Lab 07/04/20 0800  GLUCAP 105*   Cardiac Enzymes: Recent Labs  Lab 07/04/20 1657  CKTOTAL 136   No results for input(s): PROBNP in the last 8760 hours. Coagulation Profile: Recent Labs  Lab 07/07/20 0358  INR 1.7*   Thyroid Function Tests: Recent Labs    07/04/20 1657 07/07/20 0743  TSH 5.338* 11.642*  FREET4 1.80*  --    Lipid Profile: No results for input(s): CHOL, HDL, LDLCALC, TRIG, CHOLHDL, LDLDIRECT in the last 72 hours. Anemia Panel: Recent Labs    07/04/20 1657  VITAMINB12 1,518*   Urine analysis:    Component Value Date/Time   COLORURINE YELLOW 07/04/2020 0622   APPEARANCEUR HAZY (A) 07/04/2020  0622   LABSPEC 1.014 07/04/2020 0622   PHURINE 5.0 07/04/2020 0622   GLUCOSEU NEGATIVE 07/04/2020 0622   GLUCOSEU NEGATIVE 06/22/2013 1619   HGBUR SMALL (A) 07/04/2020 0622   HGBUR negative 07/30/2009 1606   BILIRUBINUR NEGATIVE 07/04/2020 0622   BILIRUBINUR negative 12/05/2019 1535   KETONESUR NEGATIVE 07/04/2020 0622   PROTEINUR NEGATIVE 07/04/2020 0622   UROBILINOGEN 0.2 12/05/2019 1535   UROBILINOGEN 0.2 06/22/2013 1619   NITRITE NEGATIVE 07/04/2020 0622   LEUKOCYTESUR SMALL (A) 07/04/2020 0622   Sepsis Labs: Invalid input(s): PROCALCITONIN, [place]  Microbiology: Recent Results (from the past 240 hour(s))  Urine culture     Status: Abnormal   Collection Time: 07/04/20  8:09 AM   Specimen: Urine, Random  Result Value Ref Range Status   Specimen Description URINE, RANDOM  Final   Special Requests   Final    NONE Performed at [facility], [address], [place], [place] [address]    Culture >=100,000 COLONIES/mL ESCHERICHIA COLI (A)  Final   Report Status 07/06/2020 FINAL  Final   Organism ID, Bacteria ESCHERICHIA COLI (A)  Final      Susceptibility   Escherichia coli - MIC*    AMPICILLIN 4 SENSITIVE Sensitive     CEFAZOLIN <=4 SENSITIVE Sensitive     CEFEPIME <=0.12 SENSITIVE Sensitive     CEFTRIAXONE <=0.25 SENSITIVE Sensitive     CIPROFLOXACIN <=0.25 SENSITIVE Sensitive     GENTAMICIN <=1 SENSITIVE Sensitive     IMIPENEM <=0.25 SENSITIVE Sensitive     NITROFURANTOIN <=16 SENSITIVE Sensitive     TRIMETH/SULFA >=320 RESISTANT Resistant     AMPICILLIN/SULBACTAM <=2 SENSITIVE Sensitive     PIP/TAZO <=4 SENSITIVE Sensitive     * >=100,000 COLONIES/mL ESCHERICHIA COLI  SARS CORONAVIRUS 2 (TAT 6-24 HRS) Nasopharyngeal Nasopharyngeal Swab     Status: None   Collection Time: 07/04/20 10:21 PM   Specimen: Nasopharyngeal Swab  Result Value Ref Range Status   SARS Coronavirus 2 NEGATIVE NEGATIVE Final    Comment: (NOTE) SARS-CoV-2 target nucleic acids are NOT  DETECTED.  The SARS-CoV-2 RNA is  generally detectable in upper and lower respiratory specimens during the acute phase of infection. Negative results do not preclude SARS-CoV-2 infection, do not rule out co-infections with other pathogens, and should not be used as the sole basis for treatment or other patient management decisions. Negative results must be combined with clinical observations, patient history, and epidemiological information. The expected result is Negative.  Fact Sheet for Patients: SugarRoll.be  Fact Sheet for Healthcare Providers: https://www.woods-mathews.com/  This test is not yet approved or cleared by the [place] FDA and  has been authorized for detection and/or diagnosis of SARS-CoV-2 by FDA under an Emergency Use Authorization (EUA). This EUA will remain  in effect (meaning this test can be used) for the duration of the COVID-19 declaration under Se ction 564(b)(1) of the Act, 21 U.S.C. section 360bbb-3(b)(1), unless the authorization is terminated or revoked sooner.  Performed at [facility], [place] [address], [place], [place] [address]     Radiology Studies: ECHOCARDIOGRAM COMPLETE  Result Date: 07/06/2020    ECHOCARDIOGRAM REPORT   Patient Name:   [name] Date of Exam: 07/06/2020 Medical Rec #:  [id number]        Height:       64.0 in Accession #:    [id number]       Weight:       100.5 lb Date of Birth:  [birth year]         BSA:          1.460 m Patient Age:    71 years         BP:           127/58 mmHg Patient Gender: F                HR:           95 bpm. Exam Location:  Inpatient Procedure: 2D Echo, Cardiac Doppler and Color Doppler            REPORT CONTAINS CRITICAL RESULT  Results communicated to Dr Cyndia Skeeters at 18:56 on 07/06/20. Indications:    R55 Syncope  History:        Patient has prior history of Echocardiogram examinations, most                 recent 04/03/2020. Prior CABG; Mitral Valve  Disease.  Sonographer:    Merrie Roof RDCS Referring Phys: [place]  1. Left ventricular ejection fraction, by estimation, is 40 to 45%. The left ventricle has mildly decreased function. The left ventricle demonstrates regional wall motion abnormalities. Abnormal (paradoxical) septal motion, consistent with left bundle branch block There is mild left ventricular hypertrophy. Left ventricular diastolic parameters are indeterminate.  2. Right ventricular systolic function is mildly reduced. The right ventricular size is normal. There is moderately elevated pulmonary artery systolic pressure. The estimated right ventricular systolic pressure is 88.4 mmHg.  3. Left atrial size was moderately dilated.  4. Right atrial size was mildly dilated.  5. The mitral valve is abnormal. Moderate to severe mitral valve regurgitation.  6. There is an aortic valve vegetation measuring 1.5cm x 0.5cm. The aortic valve is tricuspid. No aortic stenosis is present. Increased gradients through AV likely due to AI, visually does appear stenotic. Aortic valve regurgitation is moderate to severe. FINDINGS  Left Ventricle: Left ventricular ejection fraction, by estimation, is 40 to 45%. The left ventricle has mildly decreased function. The left ventricle demonstrates regional wall motion abnormalities. The left  ventricular internal cavity size was normal in size. There is mild left ventricular hypertrophy. Abnormal (paradoxical) septal motion, consistent with left bundle branch block. Left ventricular diastolic parameters are indeterminate. Right Ventricle: The right ventricular size is normal. Right vetricular wall thickness was not well visualized. Right ventricular systolic function is mildly reduced. There is moderately elevated pulmonary artery systolic pressure. The tricuspid regurgitant velocity is 3.30 m/s, and with an assumed right atrial pressure of 3 mmHg, the estimated right ventricular systolic pressure  is 28.4 mmHg. Left Atrium: Left atrial size was moderately dilated. Right Atrium: Right atrial size was mildly dilated. Pericardium: Trivial pericardial effusion is present. Mitral Valve: The mitral valve is abnormal. Moderate to severe mitral valve regurgitation. Tricuspid Valve: The tricuspid valve is normal in structure. Tricuspid valve regurgitation is mild. Aortic Valve: Aortic valve vegetation measuring 1.5cm x 0.5 cm. The aortic valve is tricuspid. Aortic valve regurgitation is severe. Aortic regurgitation PHT measures 475 msec. No aortic stenosis is present. Aortic valve mean gradient measures 9.0 mmHg. Aortic valve peak gradient measures 17.0 mmHg. Aortic valve area, by VTI measures 0.84 cm. Pulmonic Valve: The pulmonic valve was grossly normal. Pulmonic valve regurgitation is not visualized. Aorta: The aortic root is normal in size and structure. IAS/Shunts: The interatrial septum was not well visualized.  LEFT VENTRICLE PLAX 2D LVIDd:         3.80 cm  Diastology LVIDs:         2.70 cm  LV e' medial:  6.64 cm/s LV PW:         1.00 cm  LV e' lateral: 10.30 cm/s LV IVS:        1.00 cm LVOT diam:     1.70 cm LV SV:         32 LV SV Index:   22 LVOT Area:     2.27 cm  RIGHT VENTRICLE RV Basal diam:  3.40 cm LEFT ATRIUM             Index       RIGHT ATRIUM           Index LA diam:        3.10 cm 2.12 cm/m  RA Area:     16.90 cm LA Vol (A2C):   56.0 ml 38.34 ml/m RA Volume:   45.30 ml  31.02 ml/m LA Vol (A4C):   59.5 ml 40.74 ml/m LA Biplane Vol: 63.1 ml 43.21 ml/m  AORTIC VALVE AV Area (Vmax):    1.13 cm AV Area (Vmean):   0.95 cm AV Area (VTI):     0.84 cm AV Vmax:           206.00 cm/s AV Vmean:          140.000 cm/s AV VTI:            0.380 m AV Peak Grad:      17.0 mmHg AV Mean Grad:      9.0 mmHg LVOT Vmax:         103.00 cm/s LVOT Vmean:        58.500 cm/s LVOT VTI:          0.141 m LVOT/AV VTI ratio: 0.37 AI PHT:            475 msec  AORTA Ao Root diam: 2.70 cm Ao Asc diam:  2.40 cm MR Peak  grad:    86.5 mmHg   TRICUSPID VALVE MR Mean grad:    46.0 mmHg   TR  Peak grad:   43.6 mmHg MR Vmax:         465.00 cm/s TR Vmax:        330.00 cm/s MR Vmean:        307.0 cm/s MR PISA:         2.26 cm    SHUNTS MR PISA Eff ROA: 16 mm      Systemic VTI:  0.14 m MR PISA Radius:  0.60 cm     Systemic Diam: 1.70 cm Oswaldo Milian MD Electronically signed by Oswaldo Milian MD Signature Date/Time: 07/06/2020/7:15:52 PM    Final       [place] T. [place]  If 7PM-7AM, please contact night-coverage [email or web address] 07/07/2020, 1:58 PM

## 2020-07-08 DIAGNOSIS — Z7189 Other specified counseling: Secondary | ICD-10-CM

## 2020-07-08 LAB — BLOOD CULTURE ID PANEL (REFLEXED) - BCID2

## 2020-07-08 LAB — COMPREHENSIVE METABOLIC PANEL
ALT: 156 U/L — ABNORMAL HIGH (ref 0–44)
AST: 96 U/L — ABNORMAL HIGH (ref 15–41)
Albumin: 1.9 g/dL — ABNORMAL LOW (ref 3.5–5.0)
Alkaline Phosphatase: 77 U/L (ref 38–126)
Anion gap: 6 (ref 5–15)
BUN: 37 mg/dL — ABNORMAL HIGH (ref 8–23)
CO2: 18 mmol/L — ABNORMAL LOW (ref 22–32)
Calcium: 7.5 mg/dL — ABNORMAL LOW (ref 8.9–10.3)
Chloride: 104 mmol/L (ref 98–111)
Creatinine, Ser: 1.3 mg/dL — ABNORMAL HIGH (ref 0.44–1.00)
GFR, Estimated: 40 mL/min — ABNORMAL LOW (ref >60)
Glucose, Bld: 93 mg/dL (ref 70–99)
Potassium: 4.9 mmol/L (ref 3.5–5.1)
Sodium: 128 mmol/L — ABNORMAL LOW (ref 135–145)
Total Bilirubin: 0.5 mg/dL (ref 0.3–1.2)
Total Protein: 4.6 g/dL — ABNORMAL LOW (ref 6.5–8.1)

## 2020-07-08 LAB — CBC
HCT: 30 % — ABNORMAL LOW (ref 36.0–46.0)
Hemoglobin: 10 g/dL — ABNORMAL LOW (ref 12.0–15.0)
MCH: 28 pg (ref 26.0–34.0)
MCHC: 33.3 g/dL (ref 30.0–36.0)
MCV: 84 fL (ref 80.0–100.0)
Platelets: 156 10*3/uL (ref 150–400)
RBC: 3.57 MIL/uL — ABNORMAL LOW (ref 3.87–5.11)
RDW: 15.8 % — ABNORMAL HIGH (ref 11.5–15.5)
WBC: 9.9 10*3/uL (ref 4.0–10.5)
nRBC: 0 % (ref 0.0–0.2)

## 2020-07-08 LAB — PHOSPHORUS: Phosphorus: 3.3 mg/dL (ref 2.5–4.6)

## 2020-07-08 LAB — HEPARIN LEVEL (UNFRACTIONATED): Heparin Unfractionated: 1.76 IU/mL — ABNORMAL HIGH (ref 0.30–0.70)

## 2020-07-08 LAB — APTT: aPTT: 40 seconds — ABNORMAL HIGH (ref 24–36)

## 2020-07-08 LAB — PROTIME-INR
INR: 1.5 — ABNORMAL HIGH (ref 0.8–1.2)
Prothrombin Time: 17.7 seconds — ABNORMAL HIGH (ref 11.4–15.2)

## 2020-07-08 LAB — MAGNESIUM: Magnesium: 2 mg/dL (ref 1.7–2.4)

## 2020-07-08 LAB — TSH: TSH: 12.795 u[IU]/mL — ABNORMAL HIGH (ref 0.350–4.500)

## 2020-07-08 MED ORDER — SODIUM CHLORIDE 0.9 % IV SOLN
2.0000 g | Freq: Two times a day (BID) | INTRAVENOUS | Status: DC
  Administered 2020-07-08 – 2020-07-13 (×11): 2 g via INTRAVENOUS
  Filled 2020-07-08 (×11): qty 20

## 2020-07-08 MED ORDER — APIXABAN 2.5 MG PO TABS
2.5000 mg | ORAL_TABLET | Freq: Two times a day (BID) | ORAL | Status: DC
  Administered 2020-07-08 – 2020-07-13 (×11): 2.5 mg via ORAL
  Filled 2020-07-08 (×11): qty 1

## 2020-07-08 MED ORDER — SACCHAROMYCES BOULARDII 250 MG PO CAPS
250.0000 mg | ORAL_CAPSULE | Freq: Two times a day (BID) | ORAL | Status: DC
  Administered 2020-07-08 – 2020-07-13 (×11): 250 mg via ORAL
  Filled 2020-07-08 (×11): qty 1

## 2020-07-08 MED ORDER — SODIUM CHLORIDE 0.9 % IV SOLN
2.0000 g | Freq: Three times a day (TID) | INTRAVENOUS | Status: DC
  Administered 2020-07-08 – 2020-07-13 (×16): 2 g via INTRAVENOUS
  Filled 2020-07-08: qty 2000
  Filled 2020-07-08: qty 2
  Filled 2020-07-08 (×11): qty 2000
  Filled 2020-07-08: qty 2
  Filled 2020-07-08: qty 2000
  Filled 2020-07-08 (×2): qty 2
  Filled 2020-07-08 (×5): qty 2000

## 2020-07-08 NOTE — Progress Notes (Signed)
ANTICOAGULATION CONSULT NOTE - Initial Consult  Pharmacy Consult for Apixaban Indication: atrial fibrillation   Allergies  Allergen Reactions  . Actos [Pioglitazone] Swelling    Ankle swelling  . Sulfonamide Derivatives Swelling  . Adhesive [Tape] Other (See Comments)    WITH PROLONGED EXPOSURE (REDNESS/ITCHING)  . Codeine Nausea And Vomiting    Patient Measurements: Height: 5\' 4"  (162.6 cm) Weight: 45.6 kg (100 lb 8.5 oz) IBW/kg (Calculated) : 54.7 Heparin Dosing Weight: 45.6 kg   Vital Signs: Temp: 97.8 F (36.6 C) (03/13 0600) Temp Source: Oral (03/13 0600) BP: 149/49 (03/13 0600) Pulse Rate: 54 (03/13 0600)  Labs: Recent Labs    07/06/20 0052 07/07/20 0358 07/07/20 2327 07/08/20 0109  HGB 10.0* 11.0*  --  10.0*  HCT 29.3* 32.3*  --  30.0*  PLT 179 153  --  156  APTT  --   --  40*  --   LABPROT  --  19.3*  --  17.7*  INR  --  1.7*  --  1.5*  HEPARINUNFRC  --   --  1.76*  --   CREATININE 1.34* 1.31*  --  1.30*    Estimated Creatinine Clearance: 22.8 mL/min (A) (by C-G formula based on SCr of 1.3 mg/dL (H)).   Medical History: Past Medical History:  Diagnosis Date  . Allergy   . Anemia, pernicious   . Arthritis    bursitis   . Atrophic gastritis   . B12 deficiency   . CAD (coronary artery disease)   . Carotid artery stenosis   . Cataract   . Diarrhea    on and off for months  . Family history of adverse reaction to anesthesia    extreme nausea brother and sister  . Fibromyalgia   . Gallbladder polyp   . GERD (gastroesophageal reflux disease)   . Heart murmur   . History of hiatal hernia   . History of kidney stones   . HLD (hyperlipidemia)   . HTN (hypertension)   . Hypothyroidism   . Irritable bowel syndrome (IBS)   . Median arcuate ligament syndrome ([place])   . Osteoporosis   . PVD (peripheral vascular disease) ([place])   . Tubular adenoma of colon 08/2013   with focal high grade dysplasia    Assessment: 87 yo female presented on 07/04/2020  with a fall at home found to have progressive T12-L1 fractures and possible discitis. TTE found aortic valve vegetation and patient on ampicillin and ceftriaxone with blood culture positive for e. faecalis. CVTS consulted. Patient is on apixaban PTA for Afib which was being held and now consulted to resume given not a surgical candidate per CVTS. Age > 63, wt <60kg, Scr <1.5 - meets criteria for dose reduction in Afib.   Goal of Therapy:  Monitor platelets by anticoagulation protocol: Yes   Plan:  Resume apixaban 2.5mg  twice daily   Cristela Felt, PharmD Clinical Pharmacist   07/08/2020,1:55 PM

## 2020-07-08 NOTE — Progress Notes (Signed)
PHARMACY - PHYSICIAN COMMUNICATION CRITICAL VALUE ALERT - BLOOD CULTURE IDENTIFICATION (BCID)  [name] is an 87 y.o. 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  Assessment:   1/2 blood cultures from 3/11 growing Enterococcus faecalis  Name of physician (or Provider) Contacted:  Dr. Cyd Silence  Current antibiotics:  Daptomycin and Rocephin  Changes to prescribed antibiotics recommended:  None at this time.  F/U sensitivities and ID recommendations.  Results for orders placed or performed during the hospital encounter of 07/04/20  Blood Culture ID Panel (Reflexed) (Collected: 07/06/2020 10:04 PM)  Result Value Ref Range   Enterococcus faecalis DETECTED (A) NOT DETECTED   Enterococcus Faecium NOT DETECTED NOT DETECTED   Listeria monocytogenes NOT DETECTED NOT DETECTED   Staphylococcus species NOT DETECTED NOT DETECTED   Staphylococcus aureus (BCID) NOT DETECTED NOT DETECTED   Staphylococcus epidermidis NOT DETECTED NOT DETECTED   Staphylococcus lugdunensis NOT DETECTED NOT DETECTED   Streptococcus species NOT DETECTED NOT DETECTED   Streptococcus agalactiae NOT DETECTED NOT DETECTED   Streptococcus pneumoniae NOT DETECTED NOT DETECTED   Streptococcus pyogenes NOT DETECTED NOT DETECTED   A.calcoaceticus-baumannii NOT DETECTED NOT DETECTED   Bacteroides fragilis NOT DETECTED NOT DETECTED   Enterobacterales NOT DETECTED NOT DETECTED   Enterobacter cloacae complex NOT DETECTED NOT DETECTED   Escherichia coli NOT DETECTED NOT DETECTED   Klebsiella aerogenes NOT DETECTED NOT DETECTED   Klebsiella oxytoca NOT DETECTED NOT DETECTED   Klebsiella pneumoniae NOT DETECTED NOT DETECTED   Proteus species NOT DETECTED NOT DETECTED   Salmonella species NOT DETECTED NOT DETECTED   Serratia marcescens NOT DETECTED NOT DETECTED   Haemophilus influenzae NOT DETECTED NOT DETECTED   Neisseria meningitidis NOT  DETECTED NOT DETECTED   Pseudomonas aeruginosa NOT DETECTED NOT DETECTED   Stenotrophomonas maltophilia NOT DETECTED NOT DETECTED   Candida albicans NOT DETECTED NOT DETECTED   Candida auris NOT DETECTED NOT DETECTED   Candida glabrata NOT DETECTED NOT DETECTED   Candida krusei NOT DETECTED NOT DETECTED   Candida parapsilosis NOT DETECTED NOT DETECTED   Candida tropicalis NOT DETECTED NOT DETECTED   Cryptococcus neoformans/gattii NOT DETECTED NOT DETECTED   Vancomycin resistance NOT DETECTED NOT DETECTED    Caryl Pina 07/08/2020  12:11 AM

## 2020-07-08 NOTE — Progress Notes (Signed)
ID Brief Note   3/11 Blood cx 3/4 bottles [facility] in chains. BCID as E Faecalis, Vancomycin S  3/12 Blood cx 2/4 bottles [facility]PC  TTE with AV vegetation 1.5cm *0.5cm CT MF and MRI brain with no acute findings   Not a surgical candidate per CTsx. No TEE recommended.   Will change antibiotics to Ampicillin and ceftriaxone for E faecalis endocarditis. Dosing for endocarditis based on renal function per pharmacy  Repeat blood cx ordered for tomorrow.   Dr Tommy Medal is back tomorrow  Rosiland Oz, MD [place] [facility]

## 2020-07-08 NOTE — Progress Notes (Signed)
Cardiology Progress Note  Patient ID: [name] MRN: [id number] DOB: July 03, 1932 Date of Encounter: 07/08/2020  Primary Cardiologist: Sherren Mocha, MD  Subjective   Chief Complaint: Back pain.  Weakness and fatigue.  HPI: Brain MRI is negative.  Not a surgical candidate.  Has converted back to sinus rhythm.  Still reports weakness and fatigue.  ROS:  All other ROS reviewed and negative. Pertinent positives noted in the HPI.     Inpatient Medications  Scheduled Meds: . acetaminophen  1,000 mg Oral Q8H  . Chlorhexidine Gluconate Cloth  6 each Topical Daily  . feeding supplement  1 Container Oral TID BM  . heparin  1,200 Units Intravenous Once  . ipratropium  1 spray Each Nare Q12H  . lactose free nutrition  237 mL Oral TID WC  . levothyroxine  50 mcg Oral QHS  . lidocaine  1 patch Transdermal Q24H  . metoprolol tartrate  12.5 mg Oral BID  . multivitamin with minerals  1 tablet Oral Daily  . polyethylene glycol  17 g Oral Daily   Continuous Infusions: . ampicillin (OMNIPEN) IV    . cefTRIAXone (ROCEPHIN)  IV     PRN Meds: HYDROmorphone (DILAUDID) injection, hyoscyamine, labetalol, lactase, lidocaine, oxyCODONE, sodium chloride, sucralfate, traMADol   Vital Signs   Vitals:   07/07/20 0425 07/07/20 1419 07/07/20 2055 07/08/20 0600  BP: (!) 103/50 (!) 123/57 (!) 113/54 (!) 149/49  Pulse: 97 99 93 (!) 54  Resp: 19 18 20 18   Temp: 98.5 F (36.9 C) 97.6 F (36.4 C) 97.8 F (36.6 C) 97.8 F (36.6 C)  TempSrc: Oral Oral Axillary Oral  SpO2: 95% 100% 96% 94%  Weight:      Height:        Intake/Output Summary (Last 24 hours) at 07/08/2020 0907 Last data filed at 07/08/2020 0600 Gross per 24 hour  Intake 807 ml  Output 400 ml  Net 407 ml   Last 3 Weights 07/05/2020 07/04/2020 06/20/2020  Weight (lbs) 100 lb 8.5 oz 88 lb 2.9 oz 88 lb 3.2 oz  Weight (kg) 45.6 kg 40 kg 40.007 kg      Telemetry  Overnight telemetry shows normal sinus rhythm heart rate in the 50-60  be per minute range, which I personally reviewed.   Physical Exam   Vitals:   07/07/20 0425 07/07/20 1419 07/07/20 2055 07/08/20 0600  BP: (!) 103/50 (!) 123/57 (!) 113/54 (!) 149/49  Pulse: 97 99 93 (!) 54  Resp: 19 18 20 18   Temp: 98.5 F (36.9 C) 97.6 F (36.4 C) 97.8 F (36.6 C) 97.8 F (36.6 C)  TempSrc: Oral Oral Axillary Oral  SpO2: 95% 100% 96% 94%  Weight:      Height:         Intake/Output Summary (Last 24 hours) at 07/08/2020 0907 Last data filed at 07/08/2020 0600 Gross per 24 hour  Intake 807 ml  Output 400 ml  Net 407 ml    Last 3 Weights 07/05/2020 07/04/2020 06/20/2020  Weight (lbs) 100 lb 8.5 oz 88 lb 2.9 oz 88 lb 3.2 oz  Weight (kg) 45.6 kg 40 kg 40.007 kg    Body mass index is 17.26 kg/m.  General: Well nourished, well developed, in no acute distress Head: Atraumatic, normal size  Eyes: PEERLA, EOMI  Neck: Supple, JVD 5 to 7 cm of water Endocrine: No thryomegaly Cardiac: Normal S1, S2; RRR; 3-6 harsh holosystolic murmur, diastolic murmur as well Lungs: Clear to auscultation bilaterally, no wheezing, rhonchi  or rales  Abd: Soft, nontender, no hepatomegaly  Ext: No edema, pulses 2+ Musculoskeletal: No deformities, BUE and BLE strength normal and equal Skin: Warm and dry, no rashes   Neuro: Alert and oriented to person, place, time, and situation, CNII-XII grossly intact, no focal deficits  Psych: Normal mood and affect   Labs  High Sensitivity Troponin:   Recent Labs  Lab 07/04/20 0756 07/04/20 1014  TROPONINIHS 21* 24*     Cardiac EnzymesNo results for input(s): TROPONINI in the last 168 hours. No results for input(s): TROPIPOC in the last 168 hours.  Chemistry Recent Labs  Lab 07/06/20 0052 07/07/20 0358 07/08/20 0109  NA 130* 129* 128*  K 4.6 5.1 4.9  CL 105 103 104  CO2 19* 22 18*  GLUCOSE 180* 99 93  BUN 41* 39* 37*  CREATININE 1.34* 1.31* 1.30*  CALCIUM 7.4* 7.9* 7.5*  PROT 4.8* 5.0* 4.6*  ALBUMIN 1.9* 2.0* 1.9*  AST 260* 139*  96*  ALT 276* 208* 156*  ALKPHOS 78 81 77  BILITOT 0.7 0.9 0.5  GFRNONAA 39* 40* 40*  ANIONGAP 6 4* 6    Hematology Recent Labs  Lab 07/06/20 0052 07/07/20 0358 07/08/20 0109  WBC 10.7* 9.9 9.9  RBC 3.51* 3.92 3.57*  HGB 10.0* 11.0* 10.0*  HCT 29.3* 32.3* 30.0*  MCV 83.5 82.4 84.0  MCH 28.5 28.1 28.0  MCHC 34.1 34.1 33.3  RDW 15.9* 15.8* 15.8*  PLT 179 153 156   BNPNo results for input(s): BNP, PROBNP in the last 168 hours.  DDimer No results for input(s): DDIMER in the last 168 hours.   Radiology  MR BRAIN W WO CONTRAST  Result Date: 07/07/2020 CLINICAL DATA:  Dizziness.  Fall.  Aortic valve endocarditis. EXAM: MRI HEAD WITHOUT AND WITH CONTRAST TECHNIQUE: Multiplanar, multiecho pulse sequences of the brain and surrounding structures were obtained without and with intravenous contrast. CONTRAST:  42m GADAVIST GADOBUTROL 1 MMOL/ML IV SOLN COMPARISON:  Head CT 07/04/2020 FINDINGS: Brain: There is no evidence of an acute infarct, mass, midline shift, or extra-axial fluid collection. There is moderately advanced cerebral atrophy which is greatest in the parietal lobes. A few scattered chronic cerebral and cerebellar microhemorrhages are noted and are nonspecific. Several small foci of T2 hyperintensity in the cerebral white matter are nonspecific and considered to be within normal limits for age. Vascular: Major intracranial vascular flow voids are preserved. Skull and upper cervical spine: 2.5 cm enhancing left frontal skull lesion without clear CT correlate. Sinuses/Orbits: Bilateral cataract extraction. Complete opacification of the left frontal sinus. Mild bilateral ethmoid air cell mucosal thickening. Clear mastoid air cells. Other: None. IMPRESSION: 1. No acute intracranial abnormality. 2. Moderately advanced cerebral atrophy. 3. 2.5 cm enhancing left frontal skull lesion, nonspecific. Metastatic disease and multiple myeloma are not excluded. Electronically Signed   By: ALogan Bores M.D.   On: 07/07/2020 18:34   ECHOCARDIOGRAM COMPLETE  Result Date: 07/07/2020    ECHOCARDIOGRAM REPORT   Patient Name:   Sally BENNINGDate of Exam: 07/06/2020 Medical Rec #:  [id number]       Height:       64.0 in Accession #:    [id number]      Weight:       100.5 lb Date of Birth:  7December 03, 1934        BSA:          1.460 m Patient Age:    89years  BP:           127/58 mmHg Patient Gender: F                HR:           95 bpm. Exam Location:  Inpatient Procedure: 2D Echo, Cardiac Doppler and Color Doppler            REPORT CONTAINS CRITICAL RESULT  Results communicated to Dr Cyndia Skeeters at 18:56 on 07/06/20. Indications:     R55 Syncope  History:         Patient has prior history of Echocardiogram examinations, most                  recent 04/03/2020. Prior CABG; Mitral Valve Disease.  Sonographer:     Merrie Roof RDCS Referring Phys:  [place] [facility] Diagnosing Phys: Oswaldo Milian MD IMPRESSIONS  1. Left ventricular ejection fraction, by estimation, is 40 to 45%. The left ventricle has mildly decreased function. The left ventricle demonstrates regional wall motion abnormalities. Abnormal (paradoxical) septal motion, consistent with left bundle branch block There is mild left ventricular hypertrophy. Left ventricular diastolic parameters are indeterminate.  2. Right ventricular systolic function is mildly reduced. The right ventricular size is normal. There is moderately elevated pulmonary artery systolic pressure. The estimated right ventricular systolic pressure is 56.3 mmHg.  3. Left atrial size was moderately dilated.  4. Right atrial size was mildly dilated.  5. The mitral valve is abnormal. Moderate to severe mitral valve regurgitation.  6. There is an aortic valve vegetation measuring 1.5cm x 0.5cm. The aortic valve is tricuspid. No aortic stenosis is present. Increased gradients through AV likely due to AI, visually does not appear stenotic. Aortic valve regurgitation is moderate to  severe. FINDINGS  Left Ventricle: Left ventricular ejection fraction, by estimation, is 40 to 45%. The left ventricle has mildly decreased function. The left ventricle demonstrates regional wall motion abnormalities. The left ventricular internal cavity size was normal in size. There is mild left ventricular hypertrophy. Abnormal (paradoxical) septal motion, consistent with left bundle branch block. Left ventricular diastolic parameters are indeterminate. Right Ventricle: The right ventricular size is normal. Right vetricular wall thickness was not well visualized. Right ventricular systolic function is mildly reduced. There is moderately elevated pulmonary artery systolic pressure. The tricuspid regurgitant velocity is 3.30 m/s, and with an assumed right atrial pressure of 3 mmHg, the estimated right ventricular systolic pressure is 14.9 mmHg. Left Atrium: Left atrial size was moderately dilated. Right Atrium: Right atrial size was mildly dilated. Pericardium: Trivial pericardial effusion is present. Mitral Valve: The mitral valve is abnormal. Moderate to severe mitral valve regurgitation. Tricuspid Valve: The tricuspid valve is normal in structure. Tricuspid valve regurgitation is mild. Aortic Valve: Aortic valve vegetation measuring 1.5cm x 0.5 cm. The aortic valve is tricuspid. Aortic valve regurgitation is severe. Aortic regurgitation PHT measures 475 msec. No aortic stenosis is present. Aortic valve mean gradient measures 9.0 mmHg. Aortic valve peak gradient measures 17.0 mmHg. Aortic valve area, by VTI measures 0.84 cm. Pulmonic Valve: The pulmonic valve was grossly normal. Pulmonic valve regurgitation is not visualized. Aorta: The aortic root is normal in size and structure. IAS/Shunts: The interatrial septum was not well visualized.  LEFT VENTRICLE PLAX 2D LVIDd:         3.80 cm  Diastology LVIDs:         2.70 cm  LV e' medial:  6.64 cm/s LV PW:  1.00 cm  LV e' lateral: 10.30 cm/s LV IVS:         1.00 cm LVOT diam:     1.70 cm LV SV:         32 LV SV Index:   22 LVOT Area:     2.27 cm  RIGHT VENTRICLE RV Basal diam:  3.40 cm LEFT ATRIUM             Index       RIGHT ATRIUM           Index LA diam:        3.10 cm 2.12 cm/m  RA Area:     16.90 cm LA Vol (A2C):   56.0 ml 38.34 ml/m RA Volume:   45.30 ml  31.02 ml/m LA Vol (A4C):   59.5 ml 40.74 ml/m LA Biplane Vol: 63.1 ml 43.21 ml/m  AORTIC VALVE AV Area (Vmax):    1.13 cm AV Area (Vmean):   0.95 cm AV Area (VTI):     0.84 cm AV Vmax:           206.00 cm/s AV Vmean:          140.000 cm/s AV VTI:            0.380 m AV Peak Grad:      17.0 mmHg AV Mean Grad:      9.0 mmHg LVOT Vmax:         103.00 cm/s LVOT Vmean:        58.500 cm/s LVOT VTI:          0.141 m LVOT/AV VTI ratio: 0.37 AI PHT:            475 msec  AORTA Ao Root diam: 2.70 cm Ao Asc diam:  2.40 cm MR Peak grad:    86.5 mmHg   TRICUSPID VALVE MR Mean grad:    46.0 mmHg   TR Peak grad:   43.6 mmHg MR Vmax:         465.00 cm/s TR Vmax:        330.00 cm/s MR Vmean:        307.0 cm/s MR PISA:         2.26 cm    SHUNTS MR PISA Eff ROA: 16 mm      Systemic VTI:  0.14 m MR PISA Radius:  0.60 cm     Systemic Diam: 1.70 cm Oswaldo Milian MD Electronically signed by Oswaldo Milian MD Signature Date/Time: 07/06/2020/7:15:52 PM    Final (Updated)    CT MAXILLOFACIAL WO CONTRAST  Result Date: 07/07/2020 CLINICAL DATA:  Possible odontogenic source of bacteremia and discitis. EXAM: CT MAXILLOFACIAL WITHOUT CONTRAST TECHNIQUE: Multidetector CT imaging of the maxillofacial structures was performed. Multiplanar CT image reconstructions were also generated. COMPARISON:  Head MRI 07/07/2020 and CT 07/04/2020 FINDINGS: Osseous: No fracture or destructive osseous process. Multiple prior dental extractions. No definite dental caries or periapical abscess. Orbits: Bilateral cataract extraction.  No acute finding. Sinuses: Chronic left frontal sinusitis with complete sinus opacification, also  present in 06/2019. Minor bilateral ethmoid air cell mucosal thickening. Clear mastoid air cells. Soft tissues: Moderate right and mild left carotid bifurcation calcific atherosclerosis. Limited intracranial: More fully evaluated on today's head MRI. IMPRESSION: 1. No evidence of acute maxillofacial abnormality. No definite dental caries or periapical abscess. 2. Chronic left frontal sinusitis. Electronically Signed   By: Logan Bores M.D.   On: 07/07/2020 18:46    Cardiac Studies  TTE  07/07/2020 1. Left ventricular ejection fraction, by estimation, is 40 to 45%. The  left ventricle has mildly decreased function. The left ventricle  demonstrates regional wall motion abnormalities. Abnormal (paradoxical)  septal motion, consistent with left bundle  branch block There is mild left ventricular hypertrophy. Left ventricular  diastolic parameters are indeterminate.  2. Right ventricular systolic function is mildly reduced. The right  ventricular size is normal. There is moderately elevated pulmonary artery  systolic pressure. The estimated right ventricular systolic pressure is  95.6 mmHg.  3. Left atrial size was moderately dilated.  4. Right atrial size was mildly dilated.  5. The mitral valve is abnormal. Moderate to severe mitral valve  regurgitation.  6. There is an aortic valve vegetation measuring 1.5cm x 0.5cm. The  aortic valve is tricuspid. No aortic stenosis is present. Increased  gradients through AV likely due to AI, visually does not appear stenotic.  Aortic valve regurgitation is moderate to  severe.   Patient Profile  [name] is a 87 y.o. female with history of CAD status post CABG, severe mitral regurgitation, heart failure with reduced ejection fraction, atrial fibrillation, hypertension who was admitted on 07/04/2020 with weakness and fatigue.  Found to have aortic valve endocarditis.  Not a surgical candidate.  Assessment & Plan   1.  Aortic valve  endocarditis -Known history of moderate to severe AI.  She also has severe mitral regurgitation. -Admitted with weakness and fatigue and discitis.  Found to have aortic valve endocarditis with large vegetation up to 1 cm. -Evaluated by CT surgery.  She is not a candidate for intervention.  Only option is antibiotics. -Unclear if this will definitively treat it but this is her only option. -She is not in heart failure.  She seems to be doing surprisingly well. -Would recommend palliative care consult.  Does need aggressive goals of care discussion.  I have recommended DNR/DNI.  She is thinking about this. -Brain MRI negative.  No evidence of stroke.  Anticoagulation is okay.  2.  Persistent atrial fibrillation -No evidence of brain involvement or septic emboli.  Okay for anticoagulation. -Amiodarone is being held in the setting of acute liver injury. -She has converted back to sinus rhythm.  Would just recommend metoprolol tartrate 12.5 twice a day moving forward. -Again, okay to start anticoagulation.  [facility] will sign off.   Medication Recommendations: As above Other recommendations (labs, testing, etc): None Follow up as an outpatient: We will arrange follow-up with her outpatient cardiologist in 6 to 8 weeks  For questions or updates, please contact [place] Please consult [email or web address] for contact info under   Time Spent with Patient: I have spent a total of 25 minutes with patient reviewing hospital notes, telemetry, EKGs, labs and examining the patient as well as establishing an assessment and plan that was discussed with the patient.  > 50% of time was spent in direct patient care.    Signed, Addison Naegeli. Audie Box, MD, [place]  07/08/2020 9:07 AM

## 2020-07-08 NOTE — Progress Notes (Signed)
PROGRESS NOTE  [name] FMB:[id number] DOB: 24-May-1932   PCP: [name], MD  Patient is from: Home.  Lately uses walker for ambulation.  DOA: 07/04/2020 LOS: 4  Chief complaints: Fall at home  Brief Narrative / Interim history: 87 year old F with PMH of chronic back pain, DJD, disc herniation, chronic ambulatory dysfunction, CAD-CABG, chronic A. fib on Eliquis, HLD, aortic valve and mitral valve regurgitation presented to ED after fall at home, and found to have progressive T12 and L1 fractures with possible discitis, aortic valve vegetation, Enterococcus bacteremia and E. coli UTI.  NS, ID, IR, cardiology, CTS and palliative medicine consulted.  Not a surgical candidate per CTS and cardiology who recommended palliative consult.  On IV antibiotics per infectious disease.  Therapy recommended CIR but may need PICC line placement after negative blood cultures for 48 hours.  Subjective: Seen and examined earlier this morning. She states she had significant back pain last night that has improved with pain medication this morning.  No other complaints.  She denies new neuro symptoms, chest pain or dyspnea.  Objective: Vitals:   07/07/20 0425 07/07/20 1419 07/07/20 2055 07/08/20 0600  BP: (!) 103/50 (!) 123/57 (!) 113/54 (!) 149/49  Pulse: 97 99 93 (!) 54  Resp: _0 Temp: 98.5 F (36.9 C) 97.6 F (36.4 C) 97.8 F (36.6 C) 97.8 F (36.6 C)  TempSrc: Oral Oral Axillary Oral  SpO2: 95% 100% 96% 94%  Weight:      Height:        Intake/Output Summary (Last 24 hours) at 07/08/2020 1124 Last data filed at 07/08/2020 0600 Gross per 24 hour  Intake 807 ml  Output 400 ml  Net 407 ml   Filed Weights   07/04/20 0620 07/05/20 0200  Weight: 40 kg 45.6 kg    Examination:  GENERAL: No apparent distress.  Nontoxic. HEENT: MMM.  Vision and hearing grossly intact.  NECK: Supple.  No apparent JVD.  RESP: On RA.  No IWOB.  Fair aeration bilaterally. CVS:  RRR.  2/6 SEM  over LUSB and LLSB. ABD/GI/GU: BS+. Abd soft, NTND.  MSK/EXT:  Moves extremities. No apparent deformity. No edema.  SKIN: no apparent skin lesion or wound NEURO: Awake, alert and oriented appropriately.  4/5 in RLE and 5/5 in LLE.  Sensory and reflexes intact PSYCH: Calm. Normal affect.   Procedures:  None  Microbiology summarized: 3/9-COVID-19 and influenza PCR nonreactive. 3/9-urine cultures with pansensitive E. coli except to Bactrim 3/11-blood culture with Enterococcus faecalis  Assessment & Plan: Fall at home-unknown mechanism but felt lightheaded when she got up to sit on bedside chair.  She has had poor p.o. intake and weakness for sometimes likely from infection. No event on telemetry yet.  TTE showing AV vegetation with severe AVR, RWMA, moderate to severe MVR.  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 -Not a surgical candidate per cardiology and CTS. So no TEE -Infectious disease managing -Daptomycin on 3/12 -CTX 3/9>> -Ampicillin 3/13>> -Needs repeat blood culture on 3/14  Progressive T12-L1 fractures with mild retropulsion and possible discitis.   History of lumbar osteoarthritis and discoordination -Has slightly elevated inflammatory markers. -NS recommended conservative management -IR and ID consulted for possible discitis which is now more likely in the setting of bacteremia. -Pain control with scheduled Tylenol, lidocaine patch, as needed tramadol, oxycodone and  Dilaudid  AKI with azotemia: Likely prerenal.  Has significant AI. Also on Aldactone and losartan at home. Recent Labs    09/22/19 1415 10/27/19 1522 03/21/20 1307 04/30/20 1412 05/23/20 1431 07/04/20 0627 07/05/20 0428 07/06/20 0052 07/07/20 0358 07/08/20 0109  BUN _0 47* 37* 41* 39* 37*  CREATININE 1.04* 0.85  0.76 1.02* 1.10* 1.48* 1.25* 1.34* 1.31* 1.30*  -Continue holding Aldactone and losartan -Continue monitoring  Acute on chronic transaminitis: Improving.  CK within normal.  She is on amiodarone and statin which could contribute -Continue holding amiodarone and statin.   Elevated troponin: Likely demand ischemia but has LBBB.  No chest pain or dyspnea.   Chronic systolic CHF: TTE with LVEF of 40 to 45% (45 to 50% previously) and RWMA and valvular lesion as above.  No cardiopulmonary symptoms but fatigue.  Not on diuretics.  Appears euvolemic. -No further work-up. -Cardiology recommended as needed Lasix -Monitor fluid status and respiratory status  Chronic A. fib: Rate controlled.  on amiodarone and Eliquis at home -Eliquis on hold pending possible IR procedure.  Last dose 07/06/2020 -Amiodarone on hold in the setting of LFT -Continue telemetry monitoring -Okay to resume anticoagulation per cardiology-we will resume Eliquis  Hyponatremia: Na 128.  Due to AKI?  She was also on Aldactone and losartan at home which could contribute.  TSH elevated to 12 but difficult to interpret in acute setting.  A.m. cortisol appropriately high. -Follow urine sodium and osmolality -Continue holding Aldactone and losartan -Continue monitoring  Metabolic acidosis: Likely due to IV fluid.   -Continue monitoring  E. coli UTI-no new urinary symptoms -On ceftriaxone  Normocytic anemia: Dilutional? Recent Labs    09/22/19 1415 10/27/19 1522 03/21/20 1307 04/30/20 1412 05/23/20 1431 07/04/20 0627 07/05/20 0428 07/06/20 0052 07/07/20 0358 07/08/20 0109  HGB 14.0 13.2 11.5 10.8* 11.4 12.1 12.3 10.0* 11.0* 10.0*  -Continue monitoring  Hypothyroidism: TSH 12 but difficult to interpret in acute setting. -Continue home Synthroid 50 mcg daily -Need repeat TSH in 4 to 6 weeks outpatient before adjusting.  Essential hypertension: DBP in 40s and 50s likely due to severe AI -Continue holding home  meds  History of atrophic gastritis -Continue Protonix and Carafate  Peripheral neuropathy -Continue home gabapentin  Mild leukocytosis: Resolved. -Continue monitoring  Debility/physical deconditioning -PT/OT-recommended CIR.    Goal of care: Significant cardiac issues now with bacteremia, aortic vegetation, vertebral fracture with possible discitis,  and not a surgical candidate.  Very frail.  Still full code which would pose more harm than benefit.  -Palliative medicine consulted.  Severe malnutrition: As demonstrated by hypoalbuminemia and significant muscle mass and subcu fat loss Body mass index is 17.26 kg/m. Nutrition Problem: Severe Malnutrition Etiology: chronic illness (chronic lower back pain 2/2 lumbar spine OA) Signs/Symptoms: energy intake < or equal to 75% for > or equal to 1 month,severe fat depletion,severe muscle depletion,percent weight loss Percent weight loss: 6 % Interventions: Ensure Enlive (each supplement provides 350kcal and 20 grams of protein),MVI,Magic cup   Pressure skin injury: POA Pressure Injury 07/05/20 Coccyx Mid;Bilateral Stage 1 -  Intact skin with non-blanchable redness of a localized area usually over a bony prominence. RED/NON BLANCHING (Active)  07/05/20 0352  Location: Coccyx  Location Orientation: Mid;Bilateral  Staging: Stage 1 -  Intact skin with non-blanchable redness of a localized area usually over a bony prominence.  Wound Description (Comments): RED/NON BLANCHING  Present on Admission: Yes   DVT prophylaxis:  On Eliquis  Code Status: Full  code Family Communication: Updated patient's husband at bedside Level of care: Telemetry Medical Status is: Inpatient  Remains inpatient appropriate because:Ongoing active pain requiring inpatient pain management, Ongoing diagnostic testing needed not appropriate for outpatient work up, Unsafe d/c plan, IV treatments appropriate due to intensity of illness or inability to take PO and  Inpatient level of care appropriate due to severity of illness   Dispo: The patient is from: Home              Anticipated d/c is to: CIR              Patient currently is not medically stable to d/c.   Difficult to place patient No       Consultants:  Neurosurgery Infectious disease Interventional radiology Cardiothoracic surgery Cardiology  Palliative medicine   Sch Meds:  Scheduled Meds: . acetaminophen  1,000 mg Oral Q8H  . Chlorhexidine Gluconate Cloth  6 each Topical Daily  . feeding supplement  1 Container Oral TID BM  . heparin  1,200 Units Intravenous Once  . ipratropium  1 spray Each Nare Q12H  . lactose free nutrition  237 mL Oral TID WC  . levothyroxine  50 mcg Oral QHS  . lidocaine  1 patch Transdermal Q24H  . metoprolol tartrate  12.5 mg Oral BID  . multivitamin with minerals  1 tablet Oral Daily  . polyethylene glycol  17 g Oral Daily  . saccharomyces boulardii  250 mg Oral BID   Continuous Infusions: . ampicillin (OMNIPEN) IV 2 g (07/08/20 1039)  . cefTRIAXone (ROCEPHIN)  IV     PRN Meds:.HYDROmorphone (DILAUDID) injection, hyoscyamine, labetalol, lactase, lidocaine, oxyCODONE, sodium chloride, sucralfate, traMADol  Antimicrobials: Anti-infectives (From admission, onward)   Start     Dose/Rate Route Frequency Ordered Stop   07/08/20 1000  cefTRIAXone (ROCEPHIN) 2 g in sodium chloride 0.9 % 100 mL IVPB        2 g 200 mL/hr over 30 Minutes Intravenous Every 12 hours 07/08/20 0744     07/08/20 0800  ampicillin (OMNIPEN) 2 g in sodium chloride 0.9 % 100 mL IVPB       Note to Pharmacy: Ampicillin 2 g IV q8h for CrCL < 30 mL/min   2 g 300 mL/hr over 20 Minutes Intravenous Every 8 hours 07/08/20 0744     07/07/20 0915  DAPTOmycin (CUBICIN) 365 mg in sodium chloride 0.9 % IVPB  Status:  Discontinued        8 mg/kg  45.6 kg 214.6 mL/hr over 30 Minutes Intravenous Every 48 hours 07/07/20 0824 07/08/20 0744   07/07/20 0900  cefTRIAXone (ROCEPHIN) 2 g in  sodium chloride 0.9 % 100 mL IVPB  Status:  Discontinued        2 g 200 mL/hr over 30 Minutes Intravenous Daily 07/07/20 0812 07/08/20 0744   07/05/20 1030  cefTRIAXone (ROCEPHIN) 1 g in sodium chloride 0.9 % 100 mL IVPB  Status:  Discontinued        1 g 200 mL/hr over 30 Minutes Intravenous Every 24 hours 07/04/20 1326 07/06/20 1133   07/04/20 1030  cefTRIAXone (ROCEPHIN) 1 g in sodium chloride 0.9 % 100 mL IVPB        1 g 200 mL/hr over 30 Minutes Intravenous  Once 07/04/20 1025 07/04/20 1107       I have personally reviewed the following labs and images: CBC: Recent Labs  Lab 07/04/20 0627 07/05/20 0428 07/06/20 0052 07/07/20 0358 07/08/20 0109  WBC 17.6* 14.6* 10.7*  9.9 9.9  HGB 12.1 12.3 10.0* 11.0* 10.0*  HCT 37.0 37.7 29.3* 32.3* 30.0*  MCV 84.7 84.2 83.5 82.4 84.0  PLT 223 209 179 153 156   BMP &GFR Recent Labs  Lab 07/04/20 0627 07/05/20 0428 07/06/20 0052 07/07/20 0358 07/08/20 0109  NA 130* 133* 130* 129* 128*  K 4.6 4.6 4.6 5.1 4.9  CL 98 106 105 103 104  CO2 21* 18* 19* 22 18*  GLUCOSE 114* 94 180* 99 93  BUN 47* 37* 41* 39* 37*  CREATININE 1.48* 1.25* 1.34* 1.31* 1.30*  CALCIUM 8.7* 7.8* 7.4* 7.9* 7.5*  MG  --   --   --  2.0 2.0  PHOS  --   --   --  2.8 3.3   Estimated Creatinine Clearance: 22.8 mL/min (A) (by C-G formula based on SCr of 1.3 mg/dL (H)). Liver & Pancreas: Recent Labs  Lab 07/04/20 0756 07/05/20 0428 07/06/20 0052 07/07/20 0358 07/08/20 0109  AST 286* 350* 260* 139* 96*  ALT 279* 333* 276* 208* 156*  ALKPHOS 81 85 78 81 77  BILITOT 1.0 0.9 0.7 0.9 0.5  PROT 6.8 6.1* 4.8* 5.0* 4.6*  ALBUMIN 2.6* 2.3* 1.9* 2.0* 1.9*   No results for input(s): LIPASE, AMYLASE in the last 168 hours. No results for input(s): AMMONIA in the last 168 hours. Diabetic: No results for input(s): HGBA1C in the last 72 hours. Recent Labs  Lab 07/04/20 0800  GLUCAP 105*   Cardiac Enzymes: Recent Labs  Lab 07/04/20 1657  CKTOTAL 136   No  results for input(s): PROBNP in the last 8760 hours. Coagulation Profile: Recent Labs  Lab 07/07/20 0358 07/08/20 0109  INR 1.7* 1.5*   Thyroid Function Tests: Recent Labs    07/08/20 0109  TSH 12.795*   Lipid Profile: No results for input(s): CHOL, HDL, LDLCALC, TRIG, CHOLHDL, LDLDIRECT in the last 72 hours. Anemia Panel: No results for input(s): VITAMINB12, FOLATE, FERRITIN, TIBC, IRON, RETICCTPCT in the last 72 hours. Urine analysis:    Component Value Date/Time   COLORURINE YELLOW 07/04/2020 0622   APPEARANCEUR HAZY (A) 07/04/2020 0622   LABSPEC 1.014 07/04/2020 0622   PHURINE 5.0 07/04/2020 0622   GLUCOSEU NEGATIVE 07/04/2020 0622   GLUCOSEU NEGATIVE 06/22/2013 1619   HGBUR SMALL (A) 07/04/2020 0622   HGBUR negative 07/30/2009 1606   BILIRUBINUR NEGATIVE 07/04/2020 0622   BILIRUBINUR negative 12/05/2019 1535   KETONESUR NEGATIVE 07/04/2020 0622   PROTEINUR NEGATIVE 07/04/2020 0622   UROBILINOGEN 0.2 12/05/2019 1535   UROBILINOGEN 0.2 06/22/2013 1619   NITRITE NEGATIVE 07/04/2020 0622   LEUKOCYTESUR SMALL (A) 07/04/2020 0622   Sepsis Labs: Invalid input(s): PROCALCITONIN, [place]  Microbiology: Recent Results (from the past 240 hour(s))  Urine culture     Status: Abnormal   Collection Time: 07/04/20  8:09 AM   Specimen: Urine, Random  Result Value Ref Range Status   Specimen Description URINE, RANDOM  Final   Special Requests   Final    NONE Performed at [facility], [address], [place], [place] [address]    Culture >=100,000 COLONIES/mL ESCHERICHIA COLI (A)  Final   Report Status 07/06/2020 FINAL  Final   Organism ID, Bacteria ESCHERICHIA COLI (A)  Final      Susceptibility   Escherichia coli - MIC*    AMPICILLIN 4 SENSITIVE Sensitive     CEFAZOLIN <=4 SENSITIVE Sensitive     CEFEPIME <=0.12 SENSITIVE Sensitive     CEFTRIAXONE <=0.25 SENSITIVE Sensitive  CIPROFLOXACIN <=0.25 SENSITIVE Sensitive     GENTAMICIN <=1 SENSITIVE Sensitive      IMIPENEM <=0.25 SENSITIVE Sensitive     NITROFURANTOIN <=16 SENSITIVE Sensitive     TRIMETH/SULFA >=320 RESISTANT Resistant     AMPICILLIN/SULBACTAM <=2 SENSITIVE Sensitive     PIP/TAZO <=4 SENSITIVE Sensitive     * >=100,000 COLONIES/mL ESCHERICHIA COLI  SARS CORONAVIRUS 2 (TAT 6-24 HRS) Nasopharyngeal Nasopharyngeal Swab     Status: None   Collection Time: 07/04/20 10:21 PM   Specimen: Nasopharyngeal Swab  Result Value Ref Range Status   SARS Coronavirus 2 NEGATIVE NEGATIVE Final    Comment: (NOTE) SARS-CoV-2 target nucleic acids are NOT DETECTED.  The SARS-CoV-2 RNA is generally detectable in upper and lower respiratory specimens during the acute phase of infection. Negative results do not preclude SARS-CoV-2 infection, do not rule out co-infections with other pathogens, and should not be used as the sole basis for treatment or other patient management decisions. Negative results must be combined with clinical observations, patient history, and epidemiological information. The expected result is Negative.  Fact Sheet for Patients: SugarRoll.be  Fact Sheet for Healthcare Providers: https://www.woods-mathews.com/  This test is not yet approved or cleared by the [place] FDA and  has been authorized for detection and/or diagnosis of SARS-CoV-2 by FDA under an Emergency Use Authorization (EUA). This EUA will remain  in effect (meaning this test can be used) for the duration of the COVID-19 declaration under Se ction 564(b)(1) of the Act, 21 U.S.C. section 360bbb-3(b)(1), unless the authorization is terminated or revoked sooner.  Performed at [facility], [place] [address], [place], [place] [address]   Culture, blood (routine x 2)     Status: Abnormal (Preliminary result)   Collection Time: 07/06/20 10:04 PM   Specimen: BLOOD  Result Value Ref Range Status   Specimen Description BLOOD LEFT ARM  Final   Special Requests    Final    BOTTLES DRAWN AEROBIC AND ANAEROBIC Blood Culture adequate volume   Culture  Setup Time   Final    GRAM POSITIVE COCCI IN CHAINS IN BOTH AEROBIC AND ANAEROBIC BOTTLES CRITICAL RESULT CALLED TO, READ BACK BY AND VERIFIED WITH: G. ABBOTT,PHARMD 0000 07/08/2020 T. TYSOR    Culture (A)  Final    ENTEROCOCCUS FAECALIS SUSCEPTIBILITIES TO FOLLOW Performed at [facility], [place] [address] [place], [place] [address]    Report Status PENDING  Incomplete  Blood Culture ID Panel (Reflexed)     Status: Abnormal   Collection Time: 07/06/20 10:04 PM  Result Value Ref Range Status   Enterococcus faecalis DETECTED (A) NOT DETECTED Final    Comment: CRITICAL RESULT CALLED TO, READ BACK BY AND VERIFIED WITH: G. ABBOTT,PHARMD 0000 07/08/2020 T. TYSOR    Enterococcus Faecium NOT DETECTED NOT DETECTED Final   Listeria monocytogenes NOT DETECTED NOT DETECTED Final   Staphylococcus species NOT DETECTED NOT DETECTED Final   Staphylococcus aureus (BCID) NOT DETECTED NOT DETECTED Final   Staphylococcus epidermidis NOT DETECTED NOT DETECTED Final   Staphylococcus lugdunensis NOT DETECTED NOT DETECTED Final   Streptococcus species NOT DETECTED NOT DETECTED Final   Streptococcus agalactiae NOT DETECTED NOT DETECTED Final   Streptococcus pneumoniae NOT DETECTED NOT DETECTED Final   Streptococcus pyogenes NOT DETECTED NOT DETECTED Final   A.calcoaceticus-baumannii NOT DETECTED NOT DETECTED Final   Bacteroides fragilis NOT DETECTED NOT DETECTED Final   Enterobacterales NOT DETECTED NOT DETECTED Final   Enterobacter cloacae complex NOT DETECTED NOT DETECTED Final  Escherichia coli NOT DETECTED NOT DETECTED Final   Klebsiella aerogenes NOT DETECTED NOT DETECTED Final   Klebsiella oxytoca NOT DETECTED NOT DETECTED Final   Klebsiella pneumoniae NOT DETECTED NOT DETECTED Final   Proteus species NOT DETECTED NOT DETECTED Final   Salmonella species NOT DETECTED NOT DETECTED Final   Serratia  marcescens NOT DETECTED NOT DETECTED Final   Haemophilus influenzae NOT DETECTED NOT DETECTED Final   Neisseria meningitidis NOT DETECTED NOT DETECTED Final   Pseudomonas aeruginosa NOT DETECTED NOT DETECTED Final   Stenotrophomonas maltophilia NOT DETECTED NOT DETECTED Final   Candida albicans NOT DETECTED NOT DETECTED Final   Candida auris NOT DETECTED NOT DETECTED Final   Candida glabrata NOT DETECTED NOT DETECTED Final   Candida krusei NOT DETECTED NOT DETECTED Final   Candida parapsilosis NOT DETECTED NOT DETECTED Final   Candida tropicalis NOT DETECTED NOT DETECTED Final   Cryptococcus neoformans/gattii NOT DETECTED NOT DETECTED Final   Vancomycin resistance NOT DETECTED NOT DETECTED Final    Comment: Performed at [facility], [place] [address], [place], [place] [address]  Culture, blood (routine x 2)     Status: None (Preliminary result)   Collection Time: 07/06/20 10:12 PM   Specimen: BLOOD  Result Value Ref Range Status   Specimen Description BLOOD RIGHT ARM  Final   Special Requests   Final    BOTTLES DRAWN AEROBIC ONLY Blood Culture adequate volume   Culture  Setup Time   Final    GRAM POSITIVE COCCI AEROBIC BOTTLE ONLY CRITICAL VALUE NOTED.  VALUE IS CONSISTENT WITH PREVIOUSLY REPORTED AND CALLED VALUE. Performed at [facility], [place] [address], [place], [place] [address]    Culture GRAM POSITIVE COCCI  Final   Report Status PENDING  Incomplete  Culture, blood (routine x 2)     Status: None (Preliminary result)   Collection Time: 07/07/20  8:15 AM   Specimen: BLOOD  Result Value Ref Range Status   Specimen Description BLOOD RIGHT ANTECUBITAL  Final   Special Requests   Final    BOTTLES DRAWN AEROBIC ONLY Blood Culture adequate volume   Culture  Setup Time   Final    GRAM POSITIVE COCCI IN CHAINS AEROBIC BOTTLE ONLY CRITICAL VALUE NOTED.  VALUE IS CONSISTENT WITH PREVIOUSLY REPORTED AND CALLED VALUE. Performed at [facility], [place] [address],  [place], [place] [address]    Culture GRAM POSITIVE COCCI  Final   Report Status PENDING  Incomplete    Radiology Studies: MR BRAIN W WO CONTRAST  Result Date: 07/07/2020 CLINICAL DATA:  Dizziness.  Fall.  Aortic valve endocarditis. EXAM: MRI HEAD WITHOUT AND WITH CONTRAST TECHNIQUE: Multiplanar, multiecho pulse sequences of the brain and surrounding structures were obtained without and with intravenous contrast. CONTRAST:  4m GADAVIST GADOBUTROL 1 MMOL/ML IV SOLN COMPARISON:  Head CT 07/04/2020 FINDINGS: Brain: There is no evidence of an acute infarct, mass, midline shift, or extra-axial fluid collection. There is moderately advanced cerebral atrophy which is greatest in the parietal lobes. A few scattered chronic cerebral and cerebellar microhemorrhages are noted and are nonspecific. Several small foci of T2 hyperintensity in the cerebral white matter are nonspecific and considered to be within normal limits for age. Vascular: Major intracranial vascular flow voids are preserved. Skull and upper cervical spine: 2.5 cm enhancing left frontal skull lesion without clear CT correlate. Sinuses/Orbits: Bilateral cataract extraction. Complete opacification of the left frontal sinus. Mild bilateral ethmoid air cell mucosal thickening. Clear mastoid air  cells. Other: None. IMPRESSION: 1. No acute intracranial abnormality. 2. Moderately advanced cerebral atrophy. 3. 2.5 cm enhancing left frontal skull lesion, nonspecific. Metastatic disease and multiple myeloma are not excluded. Electronically Signed   By: Logan Bores M.D.   On: 07/07/2020 18:34   CT MAXILLOFACIAL WO CONTRAST  Result Date: 07/07/2020 CLINICAL DATA:  Possible odontogenic source of bacteremia and discitis. EXAM: CT MAXILLOFACIAL WITHOUT CONTRAST TECHNIQUE: Multidetector CT imaging of the maxillofacial structures was performed. Multiplanar CT image reconstructions were also generated. COMPARISON:  Head MRI 07/07/2020 and CT 07/04/2020 FINDINGS:  Osseous: No fracture or destructive osseous process. Multiple prior dental extractions. No definite dental caries or periapical abscess. Orbits: Bilateral cataract extraction.  No acute finding. Sinuses: Chronic left frontal sinusitis with complete sinus opacification, also present in 06/2019. Minor bilateral ethmoid air cell mucosal thickening. Clear mastoid air cells. Soft tissues: Moderate right and mild left carotid bifurcation calcific atherosclerosis. Limited intracranial: More fully evaluated on today's head MRI. IMPRESSION: 1. No evidence of acute maxillofacial abnormality. No definite dental caries or periapical abscess. 2. Chronic left frontal sinusitis. Electronically Signed   By: Logan Bores M.D.   On: 07/07/2020 18:46      Taye T. [place]  If 7PM-7AM, please contact night-coverage [email or web address] 07/08/2020, 11:24 AM

## 2020-07-08 NOTE — Consult Note (Addendum)
Palliative Medicine Inpatient Consult Note  Reason for consult:  Goals of care, large Aortic valve vegetation, AVR and MVR, not surgical candidate, not sure antibiotics can treat, vertebral fracture with possible discitis.  HPI:  Per 3/12 interim history by Dr. Cyndia Skeeters --> "87 year old F with PMH of chronic back pain, DJD, disc herniation, chronic ambulatory dysfunction, CAD-CABG, chronic A. fib on Eliquis, HLD, aortic valve and mitral valve regurgitation presented to ED after fall at home with possible concern for syncope.  In ED, CT head without acute finding.  CT abdomen and pelvis showed acute L1 compression fracture with approximately 50% height loss and mild retropulsion with moderate narrowing of canal, mild urothelial thickening of left-sided external pelvis and moderate volume stool.  Neurosurgery consulted. MRI lumbar spine showed moderate and progressive T12 and L1 fractures with diffuse bone marrow edema throughout T12 and L1 with retropulsion without no significant spinal stenosis but concern about possible disc space infection.  Patient also had AKI with elevated liver enzymes.  Urinalysis concerning for UTI but no acute bladder habit change.  Completed 3 days of ceftriaxone for possible UTI.  Infectious disease and IR consulted for possible discitis.  She also have some inflammatory markers.   TTE showed aortic valve vegetation.  Blood cultures obtained.  Started on ceftriaxone and daptomycin.  Cardiothoracic surgery consulted."  Due to advanced age, marked frailty and malnutrition patient is not surgical candidate to treat endocarditis.   Clinical Assessment/Goals of Care: I have reviewed medical records including EPIC notes, labs and imaging, received report from bedside RN, assessed the patient.    I met with [name] and her husband, [name] to further discuss diagnosis prognosis, GOC, EOL wishes, disposition and options.   I introduced Palliative Medicine as specialized  medical care for people living with serious illness. It focuses on providing relief from the symptoms and stress of a serious illness. The goal is to improve quality of life for both the patient and the family.  A detailed discussion was had today regarding advanced directives.  Concepts specific to code status, artifical feeding and hydration, continued IV antibiotics and rehospitalization was had.  The difference between a aggressive medical intervention path  and a palliative comfort care path for this patient at this time was had. Values and goals of care important to patient and family were attempted to be elicited  I asked [name] to tell me about herself. She is married to [name]. She grew up in [place] and worked as an Web designer for many years. She and her husband now live in [place], [place]. Since retirement she has volunteered with her church and the [employer] until January when her health prevented this.   For support she has her husband [name], [name] and her [name] family. She has no children. She does have 2 nieces who plan to visit today. Her younger Sally died of cancer and her brother died from cancer just before [name] hit. She states she has not been able to complete his paperwork for his estate but her Sally is going to take this over. She was tearful in discussing these losses.  Her husband still works a half day mornings 5 days a week in [place]. He has been doing more at home. She states she is unable to clean. They hired someone to do this. Husband does cook or gets take out for them.    Symptoms to be managed are pain and diarrhea. Pain is currently at a 2/10  at rest but can be 10/10 with movement.  She has a TLSO brace for support when up. She has had difficulty eating with items not tasting good and occasional nausea- not persistent.  She has had occasional hiccups, and is not sleeping well due to night time interruptions for needed  care. She states she has had brief periods of feeling down about her situation  but not persistent depression.  Patient does not wish to have chest compressions or ventilation in the event of a cardiac or respiratory arrest. She would not want artifical nutrition via feeding tube. She does wish IV fludids for a defiend period and antibiotics. MOST form completed.   Discussed the importance of continued conversation with family and their  medical providers regarding overall plan of care and treatment options, ensuring decisions are within the context of the patients values and GOCs.  In planning for transition to home after rehab. [name] states she has a friend who works in home health and she will get recommendations from her. Patient is fully vaccinated with booster for COVID-19.  She has not had a flu shot this year. Patient and husband have discussed the possibility of moving back to [place] to a retirement community like [facility] where there would be transitions of care. They will continue to explore this option. She is open to community based palliative referral at discharge and we will request a referral.  Advanced Directive form provided to patient and husband.   Decision Maker: Patient, she states her husband if she could not make decision. She states information may be provided to her [name].   SUMMARY OF RECOMMENDATIONS     Code Status/Advance Care Planning:  DNAR/DNI- MOST completed  with patient and husband, hard copy on unit chart. Scanned to media. patient wishes no feeding tube, Fluids for a defined period only, and  yes antibiotics if indicated.   Symptom Management:   Pain:Tylenol, lidoderm patch, tramadol, oxycodone, hydromorphone, PT and OT   Diarrhea- lactaid for lactose intolerance, probiotics added  Palliative Prophylaxis:   Malnutrition- multivitamin, boost  Abdominal cramps and nausea- hyoscyamine  Monitor for depression, nausea and  hiccups- avoid medications that are metabolized by liver with elevated tranaminases  Psycho-social/Spiritual:   Desire for further Chaplaincy support: no, pastor is visiting    Prognosis: Treatment with antibiotics are the only option for endocarditis treatment as she is not a surgical candidate. She possibly also has discitis. She is significantly malnourished at this time. All of thses concerns combined indicates a poor overall prognosis.   Discharge Planning: CIR and then to home. Community based palliative care consult with discharge.   Vitals with BMI 07/08/2020 07/07/2020 07/07/2020  Height - - -  Weight - - -  BMI - - -  Systolic [phone]  Diastolic 49 54 57  Pulse 54 93 99    PPS:50%   This conversation/these recommendations were discussed with patient primary care team, Dr. Cyndia Skeeters and team via secure chat.  Thank you for the opportunity to participate in the care of this patient and family.   Time : 8:34-9:12 and 11:35-12:10 Total Time: > 70 minutes Greater than 50%  of this time was spent counseling and coordinating care related to the above assessment and plan.  Lindell Spar, NP [facility] Palliative Medicine Team Team Cell Phone: [phone] Please utilize secure chat with additional questions, if there is no response within 30 minutes please call the above phone number  Palliative Medicine  Team providers are available by phone from 7am to 7pm daily and can be reached through the team cell phone.  Should this patient require assistance outside of these hours, please call the patient's attending physician.

## 2020-07-08 NOTE — Plan of Care (Signed)
Patient is currently resting in bed. C/o pain, given PRN pain meds. VSS. Remains on room air. HR dropped to 50's after given lopressor at bedtime. OOB pivot to [facility]. Voiding. No c/o throughout the night. Call bell within reach. Bed alarm on.   Problem: Education: Goal: Knowledge of General Education information will improve Description: Including pain rating scale, medication(s)/side effects and non-pharmacologic comfort measures Outcome: Progressing   Problem: Health Behavior/Discharge Planning: Goal: Ability to manage health-related needs will improve Outcome: Progressing   Problem: Clinical Measurements: Goal: Ability to maintain clinical measurements within normal limits will improve Outcome: Progressing Goal: Will remain free from infection Outcome: Progressing Goal: Diagnostic test results will improve Outcome: Progressing Goal: Respiratory complications will improve Outcome: Progressing Goal: Cardiovascular complication will be avoided Outcome: Progressing   Problem: Activity: Goal: Risk for activity intolerance will decrease Outcome: Progressing   Problem: Nutrition: Goal: Adequate nutrition will be maintained Outcome: Progressing   Problem: Coping: Goal: Level of anxiety will decrease Outcome: Progressing   Problem: Elimination: Goal: Will not experience complications related to bowel motility Outcome: Progressing Goal: Will not experience complications related to urinary retention Outcome: Progressing   Problem: Pain Managment: Goal: General experience of comfort will improve Outcome: Progressing   Problem: Safety: Goal: Ability to remain free from injury will improve Outcome: Progressing   Problem: Skin Integrity: Goal: Risk for impaired skin integrity will decrease Outcome: Progressing

## 2020-07-09 ENCOUNTER — Encounter (HOSPITAL_COMMUNITY): Payer: Self-pay | Admitting: Internal Medicine

## 2020-07-09 DIAGNOSIS — R000 Tachycardia, unspecified: Secondary | ICD-10-CM | POA: Diagnosis not present

## 2020-07-09 DIAGNOSIS — I059 Rheumatic mitral valve disease, unspecified: Secondary | ICD-10-CM

## 2020-07-09 DIAGNOSIS — R7881 Bacteremia: Secondary | ICD-10-CM | POA: Diagnosis not present

## 2020-07-09 DIAGNOSIS — W19XXXD Unspecified fall, subsequent encounter: Secondary | ICD-10-CM

## 2020-07-09 DIAGNOSIS — I330 Acute and subacute infective endocarditis: Principal | ICD-10-CM

## 2020-07-09 DIAGNOSIS — B952 Enterococcus as the cause of diseases classified elsewhere: Secondary | ICD-10-CM

## 2020-07-09 DIAGNOSIS — Y92009 Unspecified place in unspecified non-institutional (private) residence as the place of occurrence of the external cause: Secondary | ICD-10-CM

## 2020-07-09 HISTORY — DX: Enterococcus as the cause of diseases classified elsewhere: B95.2

## 2020-07-09 LAB — COMPREHENSIVE METABOLIC PANEL
ALT: 138 U/L — ABNORMAL HIGH (ref 0–44)
AST: 75 U/L — ABNORMAL HIGH (ref 15–41)
Albumin: 2.1 g/dL — ABNORMAL LOW (ref 3.5–5.0)
Alkaline Phosphatase: 81 U/L (ref 38–126)
Anion gap: 5 (ref 5–15)
BUN: 31 mg/dL — ABNORMAL HIGH (ref 8–23)
CO2: 18 mmol/L — ABNORMAL LOW (ref 22–32)
Calcium: 7.6 mg/dL — ABNORMAL LOW (ref 8.9–10.3)
Chloride: 103 mmol/L (ref 98–111)
Creatinine, Ser: 1.23 mg/dL — ABNORMAL HIGH (ref 0.44–1.00)
GFR, Estimated: 43 mL/min — ABNORMAL LOW (ref >60)
Glucose, Bld: 104 mg/dL — ABNORMAL HIGH (ref 70–99)
Potassium: 4.5 mmol/L (ref 3.5–5.1)
Sodium: 126 mmol/L — ABNORMAL LOW (ref 135–145)
Total Bilirubin: 0.6 mg/dL (ref 0.3–1.2)
Total Protein: 5.3 g/dL — ABNORMAL LOW (ref 6.5–8.1)

## 2020-07-09 LAB — CULTURE, BLOOD (ROUTINE X 2)
Special Requests: ADEQUATE
Special Requests: ADEQUATE
Special Requests: ADEQUATE
Special Requests: ADEQUATE

## 2020-07-09 LAB — CBC WITH DIFFERENTIAL/PLATELET
Abs Immature Granulocytes: 0.03 10*3/uL (ref 0.00–0.07)
Basophils Absolute: 0 10*3/uL (ref 0.0–0.1)
Basophils Relative: 0 %
Eosinophils Absolute: 0 10*3/uL (ref 0.0–0.5)
Eosinophils Relative: 0 %
HCT: 27.8 % — ABNORMAL LOW (ref 36.0–46.0)
Hemoglobin: 9.8 g/dL — ABNORMAL LOW (ref 12.0–15.0)
Immature Granulocytes: 0 %
Lymphocytes Relative: 5 %
Lymphs Abs: 0.6 10*3/uL — ABNORMAL LOW (ref 0.7–4.0)
MCH: 28.9 pg (ref 26.0–34.0)
MCHC: 35.3 g/dL (ref 30.0–36.0)
MCV: 82 fL (ref 80.0–100.0)
Monocytes Absolute: 1.1 10*3/uL — ABNORMAL HIGH (ref 0.1–1.0)
Monocytes Relative: 9 %
Neutro Abs: 10.2 10*3/uL — ABNORMAL HIGH (ref 1.7–7.7)
Neutrophils Relative %: 86 %
Platelets: 156 10*3/uL (ref 150–400)
RBC: 3.39 MIL/uL — ABNORMAL LOW (ref 3.87–5.11)
RDW: 15.9 % — ABNORMAL HIGH (ref 11.5–15.5)
WBC: 12 10*3/uL — ABNORMAL HIGH (ref 4.0–10.5)
nRBC: 0 % (ref 0.0–0.2)

## 2020-07-09 LAB — MAGNESIUM: Magnesium: 1.9 mg/dL (ref 1.7–2.4)

## 2020-07-09 LAB — PHOSPHORUS: Phosphorus: 3.2 mg/dL (ref 2.5–4.6)

## 2020-07-09 NOTE — Progress Notes (Signed)
PROGRESS NOTE    [name]   ZJI:[id number]  DOB: 1933-03-08  PCP: [name], MD    DOA: 07/04/2020 LOS: 5   Brief Narrative   87 year old F with PMH of chronic back pain, DJD, disc herniation, chronic ambulatory dysfunction, CAD-CABG, chronic A. fib on Eliquis, HLD, aortic valve and mitral valve regurgitation presented to ED after fall at home, and found to have progressive T12 and L1 fractures with possible discitis, aortic valve vegetation, Enterococcus bacteremia and E. coli UTI.  NS, ID, IR, cardiology, CTS and palliative medicine consulted.  Not a surgical candidate per CTS and cardiology who recommended palliative consult.    On IV antibiotics per infectious disease.   Therapy recommended CIR but  Likely needs PICC line placement once repeat blood cultures negative for 48 hours (sent 3/14 AM).  Assessment & Plan   Principal Problem:   Endocarditis of mitral valve Active Problems:   Compression fracture of L1 vertebra (HCC)   Hypothyroidism   Hyperlipidemia   Cervical spondylosis   Persistent atrial fibrillation ([place])   Fall at home, initial encounter   Fall   AKI (acute kidney injury) ([place])   Transaminitis   Pressure injury of skin   Protein-calorie malnutrition, severe   Acute lower UTI   Bacteremia due to Enterococcus   Fall at home-unknown mechanism but felt lightheaded when she got up to sit on bedside chair.  She has had poor p.o. intake and weakness for sometimes likely from infection. No event on telemetry yet.  TTE showing AV vegetation with severe AVR, RWMA, moderate to severe MVR.  -Orthostatics -Fall precautions -OOB as tolerated in TLSO brace -/PT/OT -Treat contributing causes as below  Enterococcus bacteremia Aortic valve vegetation with severe AVR/mod to sev MVR- AVR and MVR chronic. -Not a surgical candidate per cardiology and CTS. So no TEE -Infectious disease managing -Daptomycin added penidng baseline labs -CTX  3/9>> -Ampicillin 3/13>> -Repeat blood culture sent 3/14 -PICC line once repeat Bcx neg x48 hrs  Progressive T12-L1 fractures with mild retropulsion and possible discitis.   History of lumbar osteoarthritis and discoordination -Has slightly elevated inflammatory markers. -NS recommended conservative management -IR and ID consulted for possible discitis which is now more likely in the setting of bacteremia. -Pain control with scheduled Tylenol, lidocaine patch, as needed tramadol, oxycodone and Dilaudid  AKI with azotemia: Likely prerenal.  Has significant AI and low DBP's. Also on Aldactone and losartan at home. -Continue holding Aldactone and losartan -Continue monitoring  Acute on chronic transaminitis: Improving.  CK within normal.  She is on amiodarone and statin which could contribute -Continue holding amiodarone and statin.   Elevated troponin: Likely demand ischemia but has LBBB.  No chest pain or dyspnea.   Chronic systolic CHF: TTE with LVEF of 40 to 45% (45 to 50% previously) and RWMA and valvular lesion as above.  No cardiopulmonary symptoms but fatigue.  Not on diuretics.  Appears euvolemic. -No further work-up. -Cardiology recommended as needed Lasix -Monitor fluid status and respiratory status  Chronic A. fib: Rate controlled.  on amiodarone and Eliquis at home -Amiodarone held due to elevated LFT's -Continue telemetry monitoring -Okay to resume anticoagulation per cardiology-resume Eliquis  Hyponatremia: Na 128.  Due to AKI?  She was also on Aldactone and losartan at home which could contribute.  TSH elevated to 12 but difficult to interpret in acute setting.  A.m. cortisol appropriately high. ?SIADH - consider trial of fluid restriction  Appears dry / hypovolemia? -Follow urine  sodium and osmolality -Continue holding Aldactone and losartan -Continue monitoring  Metabolic acidosis: Likely due to IV fluid.   -Continue monitoring  E. coli UTI-no new  urinary symptoms -On Rocephin as above  Normocytic anemia: Dilutional?  No sign of bleeding --monitor CBC  Hypothyroidism: TSH 12, but in setting of acute illness, will continue current dose. -Continue home Synthroid 50 mcg daily -Need repeat TSH in 4 to 6 weeks outpatient   Essential hypertension: DBP in 40s and 50s likely due to severe aortic regurgitation -Continue holding home meds  History of atrophic gastritis -Continue Protonix and Carafate  Peripheral neuropathy -Continue home gabapentin  Mild leukocytosis: Resolved. -Continue monitoring CBC  Debility/physical deconditioning -PT/OT-recommended CIR - their admin coordinator is following.    Goal of care: Frail patient with significant cardiac issues now with bacteremia, endocarditis with aortic vegetation, vertebral fracture with possible discitis,  and not a surgical candidate.  Still full code which would pose more harm than benefit.  --Palliative medicine consulted for goals of care, appreciate assistance.-  Severe malnutrition: As demonstrated by hypoalbuminemia and significant muscle mass and subcu fat loss Body mass index is 16.42 kg/m. Nutrition Problem: Severe Malnutrition Etiology: chronic illness (chronic lower back pain 2/2 lumbar spine OA) Signs/Symptoms: energy intake < or equal to 75% for > or equal to 1 month,severe fat depletion,severe muscle depletion,percent weight loss Percent weight loss: 6 % Interventions: Ensure Enlive (each supplement provides 350kcal and 20 grams of protein),MVI,Magic cup   Pressure skin injury: POA Pressure Injury 07/05/20 Coccyx Mid;Bilateral Stage 1 -  Intact skin with non-blanchable redness of a localized area usually over a bony prominence. RED/NON BLANCHING (Active)  07/05/20 0352  Location: Coccyx  Location Orientation: Mid;Bilateral  Staging: Stage 1 -  Intact skin with non-blanchable redness of a localized area usually over a bony prominence.  Wound  Description (Comments): RED/NON BLANCHING  Present on Admission: Yes     Patient BMI: Body mass index is 16.42 kg/m.   DVT prophylaxis: apixaban (ELIQUIS) tablet 2.5 mg Start: 07/08/20 1245 apixaban (ELIQUIS) tablet 2.5 mg   Diet:  Diet Orders (From admission, onward)    Start     Ordered   07/04/20 1309  Diet Heart Room service appropriate? Yes; Fluid consistency: Thin  Diet effective now       Question Answer Comment  Room service appropriate? Yes   Fluid consistency: Thin      07/04/20 1308            Code Status: DNR    Subjective 07/09/20    Pt seen with sister at bedside this AM.  She reports back pain overall well controlled.  Wants brace fit and applied so she can get up to chair.  Very poor appetite, foods don't taste right.  Generally weak.  Woke in sweats, no chills.  No shortness of breath or chest pain.   Disposition Plan & Communication   Status is: Inpatient  Inpatient status appropriate due to severity of illness on IV therapies as above.  Will require placement, CIR recommended.  Dispo: The patient is from: home              Anticipated d/c is to: CIR              Patient currently NOT medically stable for D/C.   Difficult to place patient No  Family Communication: sister at bedside on rounds today   Consults, Procedures, Significant Events   Consultants:   Infectious Disease  Cardiology  CT surgery  Interventional radiology  Palliative care  Neurosurgery   Antimicrobials:  Anti-infectives (From admission, onward)   Start     Dose/Rate Route Frequency Ordered Stop   07/08/20 1000  cefTRIAXone (ROCEPHIN) 2 g in sodium chloride 0.9 % 100 mL IVPB        2 g 200 mL/hr over 30 Minutes Intravenous Every 12 hours 07/08/20 0744     07/08/20 0800  ampicillin (OMNIPEN) 2 g in sodium chloride 0.9 % 100 mL IVPB       Note to Pharmacy: Ampicillin 2 g IV q8h for CrCL < 30 mL/min   2 g 300 mL/hr over 20 Minutes Intravenous Every 8 hours  07/08/20 0744     07/07/20 0915  DAPTOmycin (CUBICIN) 365 mg in sodium chloride 0.9 % IVPB  Status:  Discontinued        8 mg/kg  45.6 kg 214.6 mL/hr over 30 Minutes Intravenous Every 48 hours 07/07/20 0824 07/08/20 0744   07/07/20 0900  cefTRIAXone (ROCEPHIN) 2 g in sodium chloride 0.9 % 100 mL IVPB  Status:  Discontinued        2 g 200 mL/hr over 30 Minutes Intravenous Daily 07/07/20 0812 07/08/20 0744   07/05/20 1030  cefTRIAXone (ROCEPHIN) 1 g in sodium chloride 0.9 % 100 mL IVPB  Status:  Discontinued        1 g 200 mL/hr over 30 Minutes Intravenous Every 24 hours 07/04/20 1326 07/06/20 1133   07/04/20 1030  cefTRIAXone (ROCEPHIN) 1 g in sodium chloride 0.9 % 100 mL IVPB        1 g 200 mL/hr over 30 Minutes Intravenous  Once 07/04/20 1025 07/04/20 1107        Micro    Objective   Vitals:   07/08/20 1426 07/08/20 2016 07/09/20 0515 07/09/20 0531  BP: (!) 134/34 (!) 149/49 (!) 126/46   Pulse: 64 64 (!) 56 (!) 56  Resp: _0 Temp: 98.1 F (36.7 C) 97.7 F (36.5 C) 97.8 F (36.6 C)   TempSrc: Oral Oral Oral   SpO2: 97% 94% 92% 91%  Weight:    43.4 kg  Height:        Intake/Output Summary (Last 24 hours) at 07/09/2020 1646 Last data filed at 07/08/2020 1820 Gross per 24 hour  Intake 0 ml  Output --  Net 0 ml   Filed Weights   07/04/20 0620 07/05/20 0200 07/09/20 0531  Weight: 40 kg 45.6 kg 43.4 kg    Physical Exam:  General exam: awake, alert, no acute distress, frail, conversational HEENT: clear conjunctiva, dry mucus membranes, hearing grossly normal  Respiratory system: CTAB, no wheezes, rales or rhonchi, normal respiratory effort. Cardiovascular system: normal P7/X4, RRR, systolic murmur, +JVP.   Gastrointestinal system: soft, NT, ND Central nervous system: A&O x. no gross focal neurologic deficits, normal speech Extremities: moves all, no edema, normal tone Skin: dry, intact, pale, no rashes on visualized skin Psychiatry: normal mood, congruent  affect, judgement and insight appear normal  Labs   Data Reviewed: I have personally reviewed following labs and imaging studies  CBC: Recent Labs  Lab 07/05/20 0428 07/06/20 0052 07/07/20 0358 07/08/20 0109 07/09/20 0054  WBC 14.6* 10.7* 9.9 9.9 12.0*  NEUTROABS  --   --   --   --  10.2*  HGB 12.3 10.0* 11.0* 10.0* 9.8*  HCT 37.7 29.3* 32.3* 30.0* 27.8*  MCV 84.2 83.5 82.4 84.0 82.0  PLT 209 179 153 156 156  Basic Metabolic Panel: Recent Labs  Lab 07/05/20 0428 07/06/20 0052 07/07/20 0358 07/08/20 0109 07/09/20 0054  NA 133* 130* 129* 128* 126*  K 4.6 4.6 5.1 4.9 4.5  CL 106 105 103 104 103  CO2 18* 19* 22 18* 18*  GLUCOSE 94 180* 99 93 104*  BUN 37* 41* 39* 37* 31*  CREATININE 1.25* 1.34* 1.31* 1.30* 1.23*  CALCIUM 7.8* 7.4* 7.9* 7.5* 7.6*  MG  --   --  2.0 2.0 1.9  PHOS  --   --  2.8 3.3 3.2   GFR: Estimated Creatinine Clearance: 22.9 mL/min (A) (by C-G formula based on SCr of 1.23 mg/dL (H)). Liver Function Tests: Recent Labs  Lab 07/05/20 0428 07/06/20 0052 07/07/20 0358 07/08/20 0109 07/09/20 0054  AST 350* 260* 139* 96* 75*  ALT 333* 276* 208* 156* 138*  ALKPHOS 85 78 81 77 81  BILITOT 0.9 0.7 0.9 0.5 0.6  PROT 6.1* 4.8* 5.0* 4.6* 5.3*  ALBUMIN 2.3* 1.9* 2.0* 1.9* 2.1*   No results for input(s): LIPASE, AMYLASE in the last 168 hours. No results for input(s): AMMONIA in the last 168 hours. Coagulation Profile: Recent Labs  Lab 07/07/20 0358 07/08/20 0109  INR 1.7* 1.5*   Cardiac Enzymes: Recent Labs  Lab 07/04/20 1657  CKTOTAL 136   BNP (last 3 results) No results for input(s): PROBNP in the last 8760 hours. HbA1C: No results for input(s): HGBA1C in the last 72 hours. CBG: Recent Labs  Lab 07/04/20 0800  GLUCAP 105*   Lipid Profile: No results for input(s): CHOL, HDL, LDLCALC, TRIG, CHOLHDL, LDLDIRECT in the last 72 hours. Thyroid Function Tests: Recent Labs    07/08/20 0109  TSH 12.795*   Anemia Panel: No results for  input(s): VITAMINB12, FOLATE, FERRITIN, TIBC, IRON, RETICCTPCT in the last 72 hours. Sepsis Labs: No results for input(s): PROCALCITON, LATICACIDVEN in the last 168 hours.  Recent Results (from the past 240 hour(s))  Urine culture     Status: Abnormal   Collection Time: 07/04/20  8:09 AM   Specimen: Urine, Random  Result Value Ref Range Status   Specimen Description URINE, RANDOM  Final   Special Requests   Final    NONE Performed at [facility], [address], [place],  [address]    Culture >=100,000 COLONIES/mL ESCHERICHIA COLI (A)  Final   Report Status 07/06/2020 FINAL  Final   Organism ID, Bacteria ESCHERICHIA COLI (A)  Final      Susceptibility   Escherichia coli - MIC*    AMPICILLIN 4 SENSITIVE Sensitive     CEFAZOLIN <=4 SENSITIVE Sensitive     CEFEPIME <=0.12 SENSITIVE Sensitive     CEFTRIAXONE <=0.25 SENSITIVE Sensitive     CIPROFLOXACIN <=0.25 SENSITIVE Sensitive     GENTAMICIN <=1 SENSITIVE Sensitive     IMIPENEM <=0.25 SENSITIVE Sensitive     NITROFURANTOIN <=16 SENSITIVE Sensitive     TRIMETH/SULFA >=320 RESISTANT Resistant     AMPICILLIN/SULBACTAM <=2 SENSITIVE Sensitive     PIP/TAZO <=4 SENSITIVE Sensitive     * >=100,000 COLONIES/mL ESCHERICHIA COLI  SARS CORONAVIRUS 2 (TAT 6-24 HRS) Nasopharyngeal Nasopharyngeal Swab     Status: None   Collection Time: 07/04/20 10:21 PM   Specimen: Nasopharyngeal Swab  Result Value Ref Range Status   SARS Coronavirus 2 NEGATIVE NEGATIVE Final    Comment: (NOTE) SARS-CoV-2 target nucleic acids are NOT DETECTED.  The SARS-CoV-2 RNA is generally detectable in upper and lower respiratory specimens during the acute  phase of infection. Negative results do not preclude SARS-CoV-2 infection, do not rule out co-infections with other pathogens, and should not be used as the sole basis for treatment or other patient management decisions. Negative results must be combined with clinical observations, patient history,  and epidemiological information. The expected result is Negative.  Fact Sheet for Patients: SugarRoll.be  Fact Sheet for Healthcare Providers: https://www.woods-mathews.com/  This test is not yet approved or cleared by the [place] FDA and  has been authorized for detection and/or diagnosis of SARS-CoV-2 by FDA under an Emergency Use Authorization (EUA). This EUA will remain  in effect (meaning this test can be used) for the duration of the COVID-19 declaration under Se ction 564(b)(1) of the Act, 21 U.S.C. section 360bbb-3(b)(1), unless the authorization is terminated or revoked sooner.  Performed at [facility], [place] [address], [place], [place] [address]   Culture, blood (routine x 2)     Status: Abnormal   Collection Time: 07/06/20 10:04 PM   Specimen: BLOOD  Result Value Ref Range Status   Specimen Description BLOOD LEFT ARM  Final   Special Requests   Final    BOTTLES DRAWN AEROBIC AND ANAEROBIC Blood Culture adequate volume   Culture  Setup Time   Final    GRAM POSITIVE COCCI IN CHAINS IN BOTH AEROBIC AND ANAEROBIC BOTTLES CRITICAL RESULT CALLED TO, READ BACK BY AND VERIFIED WITH: G. ABBOTT,PHARMD 0000 07/08/2020 T. TYSOR Performed at [facility], [place] [address], [place], [place] [address]    Culture ENTEROCOCCUS FAECALIS (A)  Final   Report Status 07/09/2020 FINAL  Final   Organism ID, Bacteria ENTEROCOCCUS FAECALIS  Final      Susceptibility   Enterococcus faecalis - MIC*    AMPICILLIN <=2 SENSITIVE Sensitive     VANCOMYCIN 1 SENSITIVE Sensitive     GENTAMICIN SYNERGY SENSITIVE Sensitive     * ENTEROCOCCUS FAECALIS  Blood Culture ID Panel (Reflexed)     Status: Abnormal   Collection Time: 07/06/20 10:04 PM  Result Value Ref Range Status   Enterococcus faecalis DETECTED (A) NOT DETECTED Final    Comment: CRITICAL RESULT CALLED TO, READ BACK BY AND VERIFIED WITH: G. ABBOTT,PHARMD 0000 07/08/2020 T. TYSOR     Enterococcus Faecium NOT DETECTED NOT DETECTED Final   Listeria monocytogenes NOT DETECTED NOT DETECTED Final   Staphylococcus species NOT DETECTED NOT DETECTED Final   Staphylococcus aureus (BCID) NOT DETECTED NOT DETECTED Final   Staphylococcus epidermidis NOT DETECTED NOT DETECTED Final   Staphylococcus lugdunensis NOT DETECTED NOT DETECTED Final   Streptococcus species NOT DETECTED NOT DETECTED Final   Streptococcus agalactiae NOT DETECTED NOT DETECTED Final   Streptococcus pneumoniae NOT DETECTED NOT DETECTED Final   Streptococcus pyogenes NOT DETECTED NOT DETECTED Final   A.calcoaceticus-baumannii NOT DETECTED NOT DETECTED Final   Bacteroides fragilis NOT DETECTED NOT DETECTED Final   Enterobacterales NOT DETECTED NOT DETECTED Final   Enterobacter cloacae complex NOT DETECTED NOT DETECTED Final   Escherichia coli NOT DETECTED NOT DETECTED Final   Klebsiella aerogenes NOT DETECTED NOT DETECTED Final   Klebsiella oxytoca NOT DETECTED NOT DETECTED Final   Klebsiella pneumoniae NOT DETECTED NOT DETECTED Final   Proteus species NOT DETECTED NOT DETECTED Final   Salmonella species NOT DETECTED NOT DETECTED Final   Serratia marcescens NOT DETECTED NOT DETECTED Final   Haemophilus influenzae NOT DETECTED NOT DETECTED Final   Neisseria meningitidis NOT DETECTED NOT DETECTED Final   Pseudomonas aeruginosa NOT DETECTED NOT  DETECTED Final   Stenotrophomonas maltophilia NOT DETECTED NOT DETECTED Final   Candida albicans NOT DETECTED NOT DETECTED Final   Candida auris NOT DETECTED NOT DETECTED Final   Candida glabrata NOT DETECTED NOT DETECTED Final   Candida krusei NOT DETECTED NOT DETECTED Final   Candida parapsilosis NOT DETECTED NOT DETECTED Final   Candida tropicalis NOT DETECTED NOT DETECTED Final   Cryptococcus neoformans/gattii NOT DETECTED NOT DETECTED Final   Vancomycin resistance NOT DETECTED NOT DETECTED Final    Comment: Performed at [facility], [place] [address],  [place], [place] [address]  Culture, blood (routine x 2)     Status: Abnormal   Collection Time: 07/06/20 10:12 PM   Specimen: BLOOD  Result Value Ref Range Status   Specimen Description BLOOD RIGHT ARM  Final   Special Requests   Final    BOTTLES DRAWN AEROBIC ONLY Blood Culture adequate volume   Culture  Setup Time   Final    GRAM POSITIVE COCCI AEROBIC BOTTLE ONLY CRITICAL VALUE NOTED.  VALUE IS CONSISTENT WITH PREVIOUSLY REPORTED AND CALLED VALUE.    Culture (A)  Final    ENTEROCOCCUS FAECALIS SUSCEPTIBILITIES PERFORMED ON PREVIOUS CULTURE WITHIN THE LAST 5 DAYS. Performed at [facility], [place] [address], [place], [place] [address]    Report Status 07/09/2020 FINAL  Final  Culture, blood (routine x 2)     Status: Abnormal   Collection Time: 07/07/20  8:00 AM   Specimen: BLOOD  Result Value Ref Range Status   Specimen Description BLOOD LEFT ANTECUBITAL  Final   Special Requests   Final    BOTTLES DRAWN AEROBIC ONLY Blood Culture adequate volume   Culture  Setup Time   Final    GRAM POSITIVE COCCI IN CHAINS AEROBIC BOTTLE ONLY CRITICAL VALUE NOTED.  VALUE IS CONSISTENT WITH PREVIOUSLY REPORTED AND CALLED VALUE.    Culture (A)  Final    ENTEROCOCCUS FAECALIS SUSCEPTIBILITIES PERFORMED ON PREVIOUS CULTURE WITHIN THE LAST 5 DAYS. Performed at [facility], [place] [address], [place], [place] [address]    Report Status 07/09/2020 FINAL  Final  Culture, blood (routine x 2)     Status: Abnormal   Collection Time: 07/07/20  8:15 AM   Specimen: BLOOD  Result Value Ref Range Status   Specimen Description BLOOD RIGHT ANTECUBITAL  Final   Special Requests   Final    BOTTLES DRAWN AEROBIC ONLY Blood Culture adequate volume   Culture  Setup Time   Final    GRAM POSITIVE COCCI IN CHAINS AEROBIC BOTTLE ONLY CRITICAL VALUE NOTED.  VALUE IS CONSISTENT WITH PREVIOUSLY REPORTED AND CALLED VALUE.    Culture (A)  Final    ENTEROCOCCUS FAECALIS SUSCEPTIBILITIES PERFORMED ON PREVIOUS  CULTURE WITHIN THE LAST 5 DAYS. Performed at [facility], [place] [address], [place], [place] [address]    Report Status 07/09/2020 FINAL  Final      Imaging Studies   MR BRAIN W WO CONTRAST  Result Date: 07/07/2020 CLINICAL DATA:  Dizziness.  Fall.  Aortic valve endocarditis. EXAM: MRI HEAD WITHOUT AND WITH CONTRAST TECHNIQUE: Multiplanar, multiecho pulse sequences of the brain and surrounding structures were obtained without and with intravenous contrast. CONTRAST:  12m GADAVIST GADOBUTROL 1 MMOL/ML IV SOLN COMPARISON:  Head CT 07/04/2020 FINDINGS: Brain: There is no evidence of an acute infarct, mass, midline shift, or extra-axial fluid collection. There is moderately advanced cerebral atrophy which is greatest in the parietal lobes. A few  scattered chronic cerebral and cerebellar microhemorrhages are noted and are nonspecific. Several small foci of T2 hyperintensity in the cerebral white matter are nonspecific and considered to be within normal limits for age. Vascular: Major intracranial vascular flow voids are preserved. Skull and upper cervical spine: 2.5 cm enhancing left frontal skull lesion without clear CT correlate. Sinuses/Orbits: Bilateral cataract extraction. Complete opacification of the left frontal sinus. Mild bilateral ethmoid air cell mucosal thickening. Clear mastoid air cells. Other: None. IMPRESSION: 1. No acute intracranial abnormality. 2. Moderately advanced cerebral atrophy. 3. 2.5 cm enhancing left frontal skull lesion, nonspecific. Metastatic disease and multiple myeloma are not excluded. Electronically Signed   By: Logan Bores M.D.   On: 07/07/2020 18:34   CT MAXILLOFACIAL WO CONTRAST  Result Date: 07/07/2020 CLINICAL DATA:  Possible odontogenic source of bacteremia and discitis. EXAM: CT MAXILLOFACIAL WITHOUT CONTRAST TECHNIQUE: Multidetector CT imaging of the maxillofacial structures was performed. Multiplanar CT image reconstructions were also generated.  COMPARISON:  Head MRI 07/07/2020 and CT 07/04/2020 FINDINGS: Osseous: No fracture or destructive osseous process. Multiple prior dental extractions. No definite dental caries or periapical abscess. Orbits: Bilateral cataract extraction.  No acute finding. Sinuses: Chronic left frontal sinusitis with complete sinus opacification, also present in 06/2019. Minor bilateral ethmoid air cell mucosal thickening. Clear mastoid air cells. Soft tissues: Moderate right and mild left carotid bifurcation calcific atherosclerosis. Limited intracranial: More fully evaluated on today's head MRI. IMPRESSION: 1. No evidence of acute maxillofacial abnormality. No definite dental caries or periapical abscess. 2. Chronic left frontal sinusitis. Electronically Signed   By: Logan Bores M.D.   On: 07/07/2020 18:46     Medications   Scheduled Meds: . acetaminophen  1,000 mg Oral Q8H  . apixaban  2.5 mg Oral BID  . Chlorhexidine Gluconate Cloth  6 each Topical Daily  . feeding supplement  1 Container Oral TID BM  . ipratropium  1 spray Each Nare Q12H  . lactose free nutrition  237 mL Oral TID WC  . levothyroxine  50 mcg Oral QHS  . lidocaine  1 patch Transdermal Q24H  . metoprolol tartrate  12.5 mg Oral BID  . multivitamin with minerals  1 tablet Oral Daily  . polyethylene glycol  17 g Oral Daily  . saccharomyces boulardii  250 mg Oral BID   Continuous Infusions: . ampicillin (OMNIPEN) IV 2 g (07/09/20 0927)  . cefTRIAXone (ROCEPHIN)  IV 2 g (07/09/20 1134)       LOS: 5 days    Time spent: 30 minutes    Ezekiel Slocumb, DO [facility]  07/09/2020, 4:46 PM      If 7PM-7AM, please contact night-coverage. How to contact the [facility] Attending or Consulting provider [place] or covering provider during after hours [place], for this patient?    1. Check the care team in [facility] and look for a) attending/consulting [facility] provider listed and b) the [facility] team listed 2. Log into [email or web address] and use [facility]'s universal password to access. If you do not have the password, please contact the hospital operator. 3. Locate the [facility] provider you are looking for under [facility] and page to a number that you can be directly reached. 4. If you still have difficulty reaching the provider, please page the [facility] (Director on Call) for the Hospitalists listed on amion for assistance.

## 2020-07-09 NOTE — Progress Notes (Signed)
Subjective: She is still complaining of severe back pain  Antibiotics:  Anti-infectives (From admission, onward)   Start     Dose/Rate Route Frequency Ordered Stop   07/08/20 1000  cefTRIAXone (ROCEPHIN) 2 g in sodium chloride 0.9 % 100 mL IVPB        2 g 200 mL/hr over 30 Minutes Intravenous Every 12 hours 07/08/20 0744     07/08/20 0800  ampicillin (OMNIPEN) 2 g in sodium chloride 0.9 % 100 mL IVPB       Note to Pharmacy: Ampicillin 2 g IV q8h for CrCL < 30 mL/min   2 g 300 mL/hr over 20 Minutes Intravenous Every 8 hours 07/08/20 0744     07/07/20 0915  DAPTOmycin (CUBICIN) 365 mg in sodium chloride 0.9 % IVPB  Status:  Discontinued        8 mg/kg  45.6 kg 214.6 mL/hr over 30 Minutes Intravenous Every 48 hours 07/07/20 0824 07/08/20 0744   07/07/20 0900  cefTRIAXone (ROCEPHIN) 2 g in sodium chloride 0.9 % 100 mL IVPB  Status:  Discontinued        2 g 200 mL/hr over 30 Minutes Intravenous Daily 07/07/20 0812 07/08/20 0744   07/05/20 1030  cefTRIAXone (ROCEPHIN) 1 g in sodium chloride 0.9 % 100 mL IVPB  Status:  Discontinued        1 g 200 mL/hr over 30 Minutes Intravenous Every 24 hours 07/04/20 1326 07/06/20 1133   07/04/20 1030  cefTRIAXone (ROCEPHIN) 1 g in sodium chloride 0.9 % 100 mL IVPB        1 g 200 mL/hr over 30 Minutes Intravenous  Once 07/04/20 1025 07/04/20 1107      Medications: Scheduled Meds: . acetaminophen  1,000 mg Oral Q8H  . apixaban  2.5 mg Oral BID  . Chlorhexidine Gluconate Cloth  6 each Topical Daily  . feeding supplement  1 Container Oral TID BM  . ipratropium  1 spray Each Nare Q12H  . lactose free nutrition  237 mL Oral TID WC  . levothyroxine  50 mcg Oral QHS  . lidocaine  1 patch Transdermal Q24H  . metoprolol tartrate  12.5 mg Oral BID  . multivitamin with minerals  1 tablet Oral Daily  . polyethylene glycol  17 g Oral Daily  . saccharomyces boulardii  250 mg Oral BID   Continuous Infusions: . ampicillin (OMNIPEN) IV 2 g  (07/09/20 0927)  . cefTRIAXone (ROCEPHIN)  IV 2 g (07/09/20 1134)   PRN Meds:.HYDROmorphone (DILAUDID) injection, hyoscyamine, labetalol, lactase, lidocaine, oxyCODONE, sodium chloride, sucralfate, traMADol    Objective: Weight change:   Intake/Output Summary (Last 24 hours) at 07/09/2020 1156 Last data filed at 07/08/2020 1820 Gross per 24 hour  Intake 200 ml  Output --  Net 200 ml   Blood pressure (!) 126/46, pulse (!) 56, temperature 97.8 F (36.6 C), temperature source Oral, resp. rate 17, height 5' 4"  (1.626 m), weight 43.4 kg, SpO2 91 %. Temp:  [97.7 F (36.5 C)-98.1 F (36.7 C)] 97.8 F (36.6 C) (03/14 0515) Pulse Rate:  [56-64] 56 (03/14 0531) Resp:  [17-20] 17 (03/14 0531) BP: (126-149)/(34-49) 126/46 (03/14 0515) SpO2:  [91 %-97 %] 91 % (03/14 0531) Weight:  [43.4 kg] 43.4 kg (03/14 0531)  Physical Exam: Physical Exam Constitutional:      Appearance: She is underweight.  Cardiovascular:     Rate and Rhythm: Tachycardia present.     Heart sounds: No murmur heard. No  friction rub.  Pulmonary:     Effort: Pulmonary effort is normal. No respiratory distress.     Breath sounds: Normal breath sounds. No stridor. No wheezing or rhonchi.  Abdominal:     General: Bowel sounds are normal. There is no distension.     Palpations: There is no mass.  Neurological:     General: No focal deficit present.     Mental Status: She is alert and oriented to person, place, and time.  Psychiatric:        Attention and Perception: Attention normal.        Mood and Affect: Mood is anxious and depressed.        Speech: Speech normal.        Behavior: Behavior normal.        Cognition and Memory: Cognition normal.      CBC:    BMET Recent Labs    07/08/20 0109 07/09/20 0054  NA 128* 126*  K 4.9 4.5  CL 104 103  CO2 18* 18*  GLUCOSE 93 104*  BUN 37* 31*  CREATININE 1.30* 1.23*  CALCIUM 7.5* 7.6*     Liver Panel  Recent Labs    07/08/20 0109 07/09/20 0054   PROT 4.6* 5.3*  ALBUMIN 1.9* 2.1*  AST 96* 75*  ALT 156* 138*  ALKPHOS 77 81  BILITOT 0.5 0.6       Sedimentation Rate No results for input(s): ESRSEDRATE in the last 72 hours. C-Reactive Protein No results for input(s): CRP in the last 72 hours.  Micro Results: Recent Results (from the past 720 hour(s))  Urine culture     Status: Abnormal   Collection Time: 07/04/20  8:09 AM   Specimen: Urine, Random  Result Value Ref Range Status   Specimen Description URINE, RANDOM  Final   Special Requests   Final    NONE Performed at [facility], [address], [place], [place] [address]    Culture >=100,000 COLONIES/mL ESCHERICHIA COLI (A)  Final   Report Status 07/06/2020 FINAL  Final   Organism ID, Bacteria ESCHERICHIA COLI (A)  Final      Susceptibility   Escherichia coli - MIC*    AMPICILLIN 4 SENSITIVE Sensitive     CEFAZOLIN <=4 SENSITIVE Sensitive     CEFEPIME <=0.12 SENSITIVE Sensitive     CEFTRIAXONE <=0.25 SENSITIVE Sensitive     CIPROFLOXACIN <=0.25 SENSITIVE Sensitive     GENTAMICIN <=1 SENSITIVE Sensitive     IMIPENEM <=0.25 SENSITIVE Sensitive     NITROFURANTOIN <=16 SENSITIVE Sensitive     TRIMETH/SULFA >=320 RESISTANT Resistant     AMPICILLIN/SULBACTAM <=2 SENSITIVE Sensitive     PIP/TAZO <=4 SENSITIVE Sensitive     * >=100,000 COLONIES/mL ESCHERICHIA COLI  SARS CORONAVIRUS 2 (TAT 6-24 HRS) Nasopharyngeal Nasopharyngeal Swab     Status: None   Collection Time: 07/04/20 10:21 PM   Specimen: Nasopharyngeal Swab  Result Value Ref Range Status   SARS Coronavirus 2 NEGATIVE NEGATIVE Final    Comment: (NOTE) SARS-CoV-2 target nucleic acids are NOT DETECTED.  The SARS-CoV-2 RNA is generally detectable in upper and lower respiratory specimens during the acute phase of infection. Negative results do not preclude SARS-CoV-2 infection, do not rule out co-infections with other pathogens, and should not be used as the sole basis for treatment or other patient  management decisions. Negative results must be combined with clinical observations, patient history, and epidemiological information. The expected result is Negative.  Fact Sheet for Patients:  SugarRoll.be  Fact Sheet for Healthcare Providers: https://www.woods-mathews.com/  This test is not yet approved or cleared by the [place] FDA and  has been authorized for detection and/or diagnosis of SARS-CoV-2 by FDA under an Emergency Use Authorization (EUA). This EUA will remain  in effect (meaning this test can be used) for the duration of the COVID-19 declaration under Se ction 564(b)(1) of the Act, 21 U.S.C. section 360bbb-3(b)(1), unless the authorization is terminated or revoked sooner.  Performed at [facility], [place] [address], [place], [place] [address]   Culture, blood (routine x 2)     Status: Abnormal   Collection Time: 07/06/20 10:04 PM   Specimen: BLOOD  Result Value Ref Range Status   Specimen Description BLOOD LEFT ARM  Final   Special Requests   Final    BOTTLES DRAWN AEROBIC AND ANAEROBIC Blood Culture adequate volume   Culture  Setup Time   Final    GRAM POSITIVE COCCI IN CHAINS IN BOTH AEROBIC AND ANAEROBIC BOTTLES CRITICAL RESULT CALLED TO, READ BACK BY AND VERIFIED WITH: G. ABBOTT,PHARMD 0000 07/08/2020 T. TYSOR Performed at [facility], [place] [address] [place], [place] [address]    Culture ENTEROCOCCUS FAECALIS (A)  Final   Report Status 07/09/2020 FINAL  Final   Organism ID, Bacteria ENTEROCOCCUS FAECALIS  Final      Susceptibility   Enterococcus faecalis - MIC*    AMPICILLIN <=2 SENSITIVE Sensitive     VANCOMYCIN 1 SENSITIVE Sensitive     GENTAMICIN SYNERGY SENSITIVE Sensitive     * ENTEROCOCCUS FAECALIS  Blood Culture ID Panel (Reflexed)     Status: Abnormal   Collection Time: 07/06/20 10:04 PM  Result Value Ref Range Status   Enterococcus faecalis DETECTED (A) NOT DETECTED Final    Comment:  CRITICAL RESULT CALLED TO, READ BACK BY AND VERIFIED WITH: G. ABBOTT,PHARMD 0000 07/08/2020 T. TYSOR    Enterococcus Faecium NOT DETECTED NOT DETECTED Final   Listeria monocytogenes NOT DETECTED NOT DETECTED Final   Staphylococcus species NOT DETECTED NOT DETECTED Final   Staphylococcus aureus (BCID) NOT DETECTED NOT DETECTED Final   Staphylococcus epidermidis NOT DETECTED NOT DETECTED Final   Staphylococcus lugdunensis NOT DETECTED NOT DETECTED Final   Streptococcus species NOT DETECTED NOT DETECTED Final   Streptococcus agalactiae NOT DETECTED NOT DETECTED Final   Streptococcus pneumoniae NOT DETECTED NOT DETECTED Final   Streptococcus pyogenes NOT DETECTED NOT DETECTED Final   A.calcoaceticus-baumannii NOT DETECTED NOT DETECTED Final   Bacteroides fragilis NOT DETECTED NOT DETECTED Final   Enterobacterales NOT DETECTED NOT DETECTED Final   Enterobacter cloacae complex NOT DETECTED NOT DETECTED Final   Escherichia coli NOT DETECTED NOT DETECTED Final   Klebsiella aerogenes NOT DETECTED NOT DETECTED Final   Klebsiella oxytoca NOT DETECTED NOT DETECTED Final   Klebsiella pneumoniae NOT DETECTED NOT DETECTED Final   Proteus species NOT DETECTED NOT DETECTED Final   Salmonella species NOT DETECTED NOT DETECTED Final   Serratia marcescens NOT DETECTED NOT DETECTED Final   Haemophilus influenzae NOT DETECTED NOT DETECTED Final   Neisseria meningitidis NOT DETECTED NOT DETECTED Final   Pseudomonas aeruginosa NOT DETECTED NOT DETECTED Final   Stenotrophomonas maltophilia NOT DETECTED NOT DETECTED Final   Candida albicans NOT DETECTED NOT DETECTED Final   Candida auris NOT DETECTED NOT DETECTED Final   Candida glabrata NOT DETECTED NOT DETECTED Final   Candida krusei NOT DETECTED NOT DETECTED Final   Candida parapsilosis NOT DETECTED NOT DETECTED Final  Candida tropicalis NOT DETECTED NOT DETECTED Final   Cryptococcus neoformans/gattii NOT DETECTED NOT DETECTED Final   Vancomycin  resistance NOT DETECTED NOT DETECTED Final    Comment: Performed at [facility], [place] [address] [place], [place] [address]  Culture, blood (routine x 2)     Status: Abnormal   Collection Time: 07/06/20 10:12 PM   Specimen: BLOOD  Result Value Ref Range Status   Specimen Description BLOOD RIGHT ARM  Final   Special Requests   Final    BOTTLES DRAWN AEROBIC ONLY Blood Culture adequate volume   Culture  Setup Time   Final    GRAM POSITIVE COCCI AEROBIC BOTTLE ONLY CRITICAL VALUE NOTED.  VALUE IS CONSISTENT WITH PREVIOUSLY REPORTED AND CALLED VALUE.    Culture (A)  Final    ENTEROCOCCUS FAECALIS SUSCEPTIBILITIES PERFORMED ON PREVIOUS CULTURE WITHIN THE LAST 5 DAYS. Performed at [facility], [place] [address] [place], [place] [address]    Report Status 07/09/2020 FINAL  Final  Culture, blood (routine x 2)     Status: Abnormal   Collection Time: 07/07/20  8:00 AM   Specimen: BLOOD  Result Value Ref Range Status   Specimen Description BLOOD LEFT ANTECUBITAL  Final   Special Requests   Final    BOTTLES DRAWN AEROBIC ONLY Blood Culture adequate volume   Culture  Setup Time   Final    GRAM POSITIVE COCCI IN CHAINS AEROBIC BOTTLE ONLY CRITICAL VALUE NOTED.  VALUE IS CONSISTENT WITH PREVIOUSLY REPORTED AND CALLED VALUE.    Culture (A)  Final    ENTEROCOCCUS FAECALIS SUSCEPTIBILITIES PERFORMED ON PREVIOUS CULTURE WITHIN THE LAST 5 DAYS. Performed at [facility], [place] [address], [place], [place] [address]    Report Status 07/09/2020 FINAL  Final  Culture, blood (routine x 2)     Status: Abnormal   Collection Time: 07/07/20  8:15 AM   Specimen: BLOOD  Result Value Ref Range Status   Specimen Description BLOOD RIGHT ANTECUBITAL  Final   Special Requests   Final    BOTTLES DRAWN AEROBIC ONLY Blood Culture adequate volume   Culture  Setup Time   Final    GRAM POSITIVE COCCI IN CHAINS AEROBIC BOTTLE ONLY CRITICAL VALUE NOTED.  VALUE IS CONSISTENT WITH PREVIOUSLY  REPORTED AND CALLED VALUE.    Culture (A)  Final    ENTEROCOCCUS FAECALIS SUSCEPTIBILITIES PERFORMED ON PREVIOUS CULTURE WITHIN THE LAST 5 DAYS. Performed at [facility], [place] [address] [place], [place] [address]    Report Status 07/09/2020 FINAL  Final    Studies/Results: MR BRAIN W WO CONTRAST  Result Date: 07/07/2020 CLINICAL DATA:  Dizziness.  Fall.  Aortic valve endocarditis. EXAM: MRI HEAD WITHOUT AND WITH CONTRAST TECHNIQUE: Multiplanar, multiecho pulse sequences of the brain and surrounding structures were obtained without and with intravenous contrast. CONTRAST:  47m GADAVIST GADOBUTROL 1 MMOL/ML IV SOLN COMPARISON:  Head CT 07/04/2020 FINDINGS: Brain: There is no evidence of an acute infarct, mass, midline shift, or extra-axial fluid collection. There is moderately advanced cerebral atrophy which is greatest in the parietal lobes. A few scattered chronic cerebral and cerebellar microhemorrhages are noted and are nonspecific. Several small foci of T2 hyperintensity in the cerebral white matter are nonspecific and considered to be within normal limits for age. Vascular: Major intracranial vascular flow voids are preserved. Skull and upper cervical spine: 2.5 cm enhancing left frontal skull lesion without clear CT correlate. Sinuses/Orbits: Bilateral cataract extraction. Complete opacification  of the left frontal sinus. Mild bilateral ethmoid air cell mucosal thickening. Clear mastoid air cells. Other: None. IMPRESSION: 1. No acute intracranial abnormality. 2. Moderately advanced cerebral atrophy. 3. 2.5 cm enhancing left frontal skull lesion, nonspecific. Metastatic disease and multiple myeloma are not excluded. Electronically Signed   By: Logan Bores M.D.   On: 07/07/2020 18:34   CT MAXILLOFACIAL WO CONTRAST  Result Date: 07/07/2020 CLINICAL DATA:  Possible odontogenic source of bacteremia and discitis. EXAM: CT MAXILLOFACIAL WITHOUT CONTRAST TECHNIQUE: Multidetector CT imaging of  the maxillofacial structures was performed. Multiplanar CT image reconstructions were also generated. COMPARISON:  Head MRI 07/07/2020 and CT 07/04/2020 FINDINGS: Osseous: No fracture or destructive osseous process. Multiple prior dental extractions. No definite dental caries or periapical abscess. Orbits: Bilateral cataract extraction.  No acute finding. Sinuses: Chronic left frontal sinusitis with complete sinus opacification, also present in 06/2019. Minor bilateral ethmoid air cell mucosal thickening. Clear mastoid air cells. Soft tissues: Moderate right and mild left carotid bifurcation calcific atherosclerosis. Limited intracranial: More fully evaluated on today's head MRI. IMPRESSION: 1. No evidence of acute maxillofacial abnormality. No definite dental caries or periapical abscess. 2. Chronic left frontal sinusitis. Electronically Signed   By: Logan Bores M.D.   On: 07/07/2020 18:46      Assessment/Plan:  INTERVAL HISTORY: Patient is found to have Enterococcus faecalis bacteremia and endocarditis   Principal Problem:   Compression fracture of L1 vertebra (HCC) Active Problems:   Hypothyroidism   Hyperlipidemia   Cervical spondylosis   Persistent atrial fibrillation ([place])   Fall at home, initial encounter   Fall   AKI (acute kidney injury) ([place])   Transaminitis   Pressure injury of skin   Protein-calorie malnutrition, severe   Acute lower UTI    [name] is a 87 y.o. female with recent syncopal episode fall low back pain with MRI concerning for discitis.  In the interim her blood cultures have turned positive for Enterococcus faecalis and a transthoracic echocardiogram that orders have showed a large vegetation on the mitral valve.  She has not an operative candidate for cardiothoracic surgery.  Enterococcus faecalis endocarditis of mitral valve:  Continue dual beta-lactam therapy to complete 6 weeks of systemic antibiotics  I am concerned given the size of her  vegetation that it may embolized to her central nervous system or kidneys.  We are repeating blood cultures to assure ourselves that her bloodstream is become sterile  She has been seen by palliative care.  Discitis: Undoubtably due to the same organism seen in her blood cultures.     LOS: 5 days   Alcide Evener 07/09/2020, 11:56 AM

## 2020-07-09 NOTE — Progress Notes (Signed)
Physical Therapy Treatment Patient Details Name: [name] MRN: [id number] DOB: 1932/10/09 Today's Date: 07/09/2020    History of Present Illness 87 y.o. female presented to ED 3/9 after fall and question of near syncope. CT lumbar spine reveals L1 acute compression fx. Admitted for treatment of possible syncope, L1 fx, Worsening of chronic transaminases and UTI. Pt reports numbness and tingling in bilateral LE R>L, bladder and bowel incontinence. TLSO for OOB Found to have mitral valv vegetation  PMH: chronic back pain secondary to lumbar spine OA, chronic ambulation dysfunction, CAD status post CABG, chronic A. fib on Eliquis, HLD, aortic regurgitation and mitral regurgitation,    PT Comments    Pt reports that she has not been out of bed this weekend because no one could figure out her TLSO. Pt feeling better, as a result she does not adhere to back precautions with bed mobility, stating "I've been doing this for a long time, I know what I'm doing" Gently reminded pt of correct technique. Once in sitting educated current RN, pt and husband to proper donning and doffing of brace to be used with other staff. Pt min A for transfers and stepping to recliner. Pt declined further ambulation today. PT will progress mobility acutely.    Follow Up Recommendations  CIR     Equipment Recommendations  Wheelchair (measurements PT);Wheelchair cushion (measurements PT)    Recommendations for Other Services Rehab consult     Precautions / Restrictions Precautions Precautions: Fall;Back Precaution Booklet Issued: Yes (comment) Precaution Comments: spinal brace for OOB donned in seated, educated RN to proper don and dof, RN able to complete independently Required Braces or Orthoses: Spinal Brace Spinal Brace: Thoracolumbosacral orthotic Restrictions Weight Bearing Restrictions: No    Mobility  Bed Mobility Overal bed mobility: Needs Assistance Bed Mobility: Rolling;Supine to Sit Rolling:  Supervision   Supine to sit: Min assist     General bed mobility comments: supervision for rolling but then comes to longsitting and needs increased effort to scoot to EoB, educated on rolling and pushing up through sidelying which she confirms is how she normally does it, when asked why she scooted across the bed she can not answer    Transfers Overall transfer level: Needs assistance Equipment used: 1 person hand held assist Transfers: Sit to/from [facility] Sit to Stand: Min assist         General transfer comment: good power up min A for steadying  Ambulation/Gait Ambulation/Gait assistance: Min assist Gait Distance (Feet): 3 Feet Assistive device: 1 person hand held assist Gait Pattern/deviations: Step-through pattern;Decreased step length - right;Decreased step length - left;Trunk flexed;Shuffle Gait velocity: slowed   General Gait Details: light min A for very short steps to recliner, decreased command follow with slowed lowering to chair         Balance Overall balance assessment: Needs assistance;History of Falls Sitting-balance support: Feet supported;Single extremity supported;Bilateral upper extremity supported Sitting balance-Leahy Scale: Fair     Standing balance support: Bilateral upper extremity supported;During functional activity Standing balance-Leahy Scale: Poor Standing balance comment: reliant on UE support                            Cognition Arousal/Alertness: Awake/alert Behavior During Therapy: WFL for tasks assessed/performed;Anxious Overall Cognitive Status: Within Functional Limits for tasks assessed  General Comments General comments (skin integrity, edema, etc.): VSS on RA, continue to educated on need for protein consumption, pt reports it makes her sick,      Pertinent Vitals/Pain Pain Assessment: Faces Faces Pain Scale: Hurts a little  bit Pain Location: back with movement Pain Descriptors / Indicators: Discomfort;Grimacing;Moaning Pain Intervention(s): Limited activity within patient's tolerance;Monitored during session;Repositioned;Patient requesting pain meds-RN notified           PT Goals (current goals can now be found in the care plan section) Acute Rehab PT Goals Patient Stated Goal: be able to do for myself, get stronger PT Goal Formulation: With patient/family Time For Goal Achievement: 07/19/20 Potential to Achieve Goals: Fair Progress towards PT goals: Progressing toward goals    Frequency    Min 3X/week      PT Plan Current plan remains appropriate       AM-PAC PT "6 Clicks" Mobility   Outcome Measure  Help needed turning from your back to your side while in a flat bed without using bedrails?: A Little Help needed moving from lying on your back to sitting on the side of a flat bed without using bedrails?: A Lot Help needed moving to and from a bed to a chair (including a wheelchair)?: A Little Help needed standing up from a chair using your arms (e.g., wheelchair or bedside chair)?: A Little Help needed to walk in hospital room?: A Lot Help needed climbing 3-5 steps with a railing? : Total 6 Click Score: 14    End of Session Equipment Utilized During Treatment: Back brace Activity Tolerance: Other (comment) Patient left: in chair;with call bell/phone within reach;with nursing/sitter in room Nurse Communication: Mobility status;Other (comment) (TLSO donning and doffing) PT Visit Diagnosis: Unsteadiness on feet (R26.81);Other abnormalities of gait and mobility (R26.89);Muscle weakness (generalized) (M62.81);Difficulty in walking, not elsewhere classified (R26.2);Other symptoms and signs involving the nervous system (R29.898);Pain     Time: 7867-6720 PT Time Calculation (min) (ACUTE ONLY): 19 min  Charges:  $Therapeutic Activity: 8-22 mins                     Ora Mcnatt B. Migdalia Dk PT,  DPT Acute Rehabilitation Services Pager [phone] Office [phone]    [place] 07/09/2020, 5:16 PM

## 2020-07-10 DIAGNOSIS — S32010D Wedge compression fracture of first lumbar vertebra, subsequent encounter for fracture with routine healing: Secondary | ICD-10-CM

## 2020-07-10 DIAGNOSIS — B952 Enterococcus as the cause of diseases classified elsewhere: Secondary | ICD-10-CM

## 2020-07-10 DIAGNOSIS — W19XXXA Unspecified fall, initial encounter: Secondary | ICD-10-CM

## 2020-07-10 DIAGNOSIS — I059 Rheumatic mitral valve disease, unspecified: Secondary | ICD-10-CM

## 2020-07-10 DIAGNOSIS — M47812 Spondylosis without myelopathy or radiculopathy, cervical region: Secondary | ICD-10-CM | POA: Diagnosis not present

## 2020-07-10 DIAGNOSIS — R7881 Bacteremia: Secondary | ICD-10-CM | POA: Diagnosis not present

## 2020-07-10 DIAGNOSIS — L8991 Pressure ulcer of unspecified site, stage 1: Secondary | ICD-10-CM

## 2020-07-10 DIAGNOSIS — N179 Acute kidney failure, unspecified: Secondary | ICD-10-CM | POA: Diagnosis not present

## 2020-07-10 LAB — CBC
HCT: 28.6 % — ABNORMAL LOW (ref 36.0–46.0)
Hemoglobin: 9.7 g/dL — ABNORMAL LOW (ref 12.0–15.0)
MCH: 28.1 pg (ref 26.0–34.0)
MCHC: 33.9 g/dL (ref 30.0–36.0)
MCV: 82.9 fL (ref 80.0–100.0)
Platelets: 167 10*3/uL (ref 150–400)
RBC: 3.45 MIL/uL — ABNORMAL LOW (ref 3.87–5.11)
RDW: 15.8 % — ABNORMAL HIGH (ref 11.5–15.5)
WBC: 11.7 10*3/uL — ABNORMAL HIGH (ref 4.0–10.5)
nRBC: 0 % (ref 0.0–0.2)

## 2020-07-10 LAB — SODIUM: Sodium: 130 mmol/L — ABNORMAL LOW (ref 135–145)

## 2020-07-10 LAB — OSMOLALITY: Osmolality: 279 mOsm/kg (ref 275–295)

## 2020-07-10 NOTE — Progress Notes (Signed)
Occupational Therapy Treatment Patient Details Name: [name] MRN: [id number] DOB: 24-Nov-1932 Today's Date: 07/10/2020    History of present illness 87 y.o. female presented to ED 3/9 after fall and question of near syncope. CT lumbar spine reveals L1 acute compression fx. Admitted for treatment of possible syncope, L1 fx, Worsening of chronic transaminases and UTI. Pt reports numbness and tingling in bilateral LE R>L, bladder and bowel incontinence. TLSO for OOB Found to have mitral valv vegetation  PMH: chronic back pain secondary to lumbar spine OA, chronic ambulation dysfunction, CAD status post CABG, chronic A. fib on Eliquis, HLD, aortic regurgitation and mitral regurgitation,   OT comments  Pt making steady progress towards OT goals this session.  Pt continues to present with increased pain,  back precautions and impaired balance impacting pts ability to complete BADLs independently. Pt requires cues to recall back precautions and requires up to MAX for UB ADLs. Pt with good adherence to back precautions during bed mobility, however pt presenting with decreased activity tolerance with able to ambulate ~ 10 ft with RW and min guard assist needing steadying assist and increased physical assist as pt fatigues. Pt continues to be an excellent CIR candidate, will continue to follow acutely per POC.    Follow Up Recommendations  Supervision/Assistance - 24 hour;CIR    Equipment Recommendations  3 in 1 bedside commode    Recommendations for Other Services      Precautions / Restrictions Precautions Precautions: Fall;Back Precaution Booklet Issued: Yes (comment) Precaution Comments: pt able to state 1/3 precautions but required assist to recall no bending or lifting Required Braces or Orthoses: Spinal Brace Spinal Brace: Thoracolumbosacral orthotic Restrictions Weight Bearing Restrictions: No       Mobility Bed Mobility Overal bed mobility: Needs Assistance Bed Mobility:  Rolling;Sidelying to Sit Rolling: Supervision Sidelying to sit: Min guard       General bed mobility comments: pt with good carryover of log roll technique with pt needing only min guard for safety to elevate trunk into sitting    Transfers Overall transfer level: Needs assistance Equipment used: Rolling walker (2 wheeled) Transfers: Sit to/from Stand Sit to Stand: Min guard         General transfer comment: min guard to rise from EOB with cues for hand placement    Balance Overall balance assessment: Needs assistance;History of Falls Sitting-balance support: Feet supported;No upper extremity supported Sitting balance-Leahy Scale: Good Sitting balance - Comments: sitting EOB with no UE support and no LOB   Standing balance support: Bilateral upper extremity supported;During functional activity Standing balance-Leahy Scale: Poor Standing balance comment: reliant on UE support                           ADL either performed or assessed with clinical judgement   ADL Overall ADL's : Needs assistance/impaired                 Upper Body Dressing : Maximal assistance;Sitting Upper Body Dressing Details (indicate cue type and reason): to odn brace from EOB     Toilet Transfer: Min Marine scientist Details (indicate cue type and reason): simulated via functional mobility wtih Rw         Functional mobility during ADLs: Min guard;Rolling walker General ADL Comments: pt continues to present with increased pain, back precaution and impaired balance     Vision       Perception     Praxis  Cognition Arousal/Alertness: Awake/alert Behavior During Therapy: WFL for tasks assessed/performed;Anxious Overall Cognitive Status: Within Functional Limits for tasks assessed                                 General Comments: pt continues to be slightly anxious with mobility needing increased time to follow commands and complete  mobility tasks        Exercises     Shoulder Instructions       General Comments VSS on RA, pt sister present during session    Pertinent Vitals/ Pain       Pain Assessment: 0-10 Pain Score: 7  Pain Location: back with mobility Pain Descriptors / Indicators: Discomfort;Grimacing Pain Intervention(s): Monitored during session;Repositioned;RN gave pain meds during session  Home Living                                          Prior Functioning/Environment              Frequency  Min 2X/week        Progress Toward Goals  OT Goals(current goals can now be found in the care plan section)  Progress towards OT goals: Progressing toward goals  Acute Rehab OT Goals Patient Stated Goal: to go to CIR OT Goal Formulation: With patient/family Time For Goal Achievement: 07/19/20 Potential to Achieve Goals: Good  Plan Discharge plan remains appropriate;Frequency remains appropriate    Co-evaluation                 AM-PAC OT "6 Clicks" Daily Activity     Outcome Measure   Help from another person eating meals?: None Help from another person taking care of personal grooming?: A Little Help from another person toileting, which includes using toliet, bedpan, or urinal?: A Lot Help from another person bathing (including washing, rinsing, drying)?: A Lot Help from another person to put on and taking off regular upper body clothing?: A Lot Help from another person to put on and taking off regular lower body clothing?: Total 6 Click Score: 14    End of Session Equipment Utilized During Treatment: Rolling walker;Back brace  OT Visit Diagnosis: Unsteadiness on feet (R26.81);Other abnormalities of gait and mobility (R26.89);Muscle weakness (generalized) (M62.81);History of falling (Z91.81);Pain   Activity Tolerance Patient tolerated treatment well   Patient Left in chair;with call bell/phone within reach;with family/visitor present   Nurse  Communication Mobility status        Time: 5732-2025 OT Time Calculation (min): 38 min  Charges: OT General Charges $OT Visit: 1 Visit OT Treatments $Self Care/Home Management : 8-22 mins $Therapeutic Activity: 23-37 mins  Harley Alto., COTA/L Acute Rehabilitation Services [phone]    Precious Haws 07/10/2020, 1:04 PM

## 2020-07-10 NOTE — Progress Notes (Signed)
PROGRESS NOTE    [name]   POE:[id number]  DOB: Oct 05, 1932  PCP: [name], MD    DOA: 07/04/2020 LOS: 85   Brief Narrative   87 year old F with PMH of chronic back pain, DJD, disc herniation, chronic ambulatory dysfunction, CAD-CABG, chronic A. fib on Eliquis, HLD, aortic valve and mitral valve regurgitation presented to ED after fall at home, and found to have progressive T12 and L1 fractures with possible discitis, aortic valve vegetation, Enterococcus bacteremia and E. coli UTI.  NS, ID, IR, cardiology, CTS and palliative medicine consulted.  Not a surgical candidate per CTS and cardiology who recommended palliative consult.    On IV antibiotics per infectious disease.   Therapy recommended CIR but  Likely needs PICC line placement once repeat blood cultures negative for 48 hours (sent 3/14 AM).  Assessment & Plan   Principal Problem:   Endocarditis of mitral valve Active Problems:   Compression fracture of L1 vertebra (HCC)   Hypothyroidism   Hyperlipidemia   Cervical spondylosis   Persistent atrial fibrillation ([facility])   Fall at home, initial encounter   Fall   AKI (acute kidney injury) ([facility])   Transaminitis   Pressure injury of skin   Protein-calorie malnutrition, severe   Acute lower UTI   Bacteremia due to Enterococcus   Fall at home-unknown mechanism but felt lightheaded when she got up to sit on bedside chair.  She has had poor p.o. intake and weakness for sometimes likely from infection. No event on telemetry yet.  TTE showing AV vegetation with severe AVR, RWMA, moderate to severe MVR.  -Orthostatics -Fall precautions -OOB as tolerated in TLSO brace -/PT/OT -Treat contributing causes as below  Enterococcus bacteremia Aortic valve vegetation with severe AVR/mod to sev MVR- AVR and MVR chronic. -Not a surgical candidate per cardiology and CTS. So no TEE -Infectious disease managing -Plan for 6 weeks VI antibiotics -Daptomycin added  -CTX  3/9>> -Ampicillin 3/13>> -Repeat blood culture sent 3/14 - follow -PICC line once repeat Bcx neg x48 hrs  Progressive T12-L1 fractures with mild retropulsion and possible discitis.   History of lumbar osteoarthritis and discoordination -Has slightly elevated inflammatory markers. -NS recommended conservative management -TLSO brace -IR and ID consulted for possible discitis which is now more likely in the setting of bacteremia. -Pain control with scheduled Tylenol, lidocaine patch, as needed tramadol, oxycodone and Dilaudid  AKI with azotemia: Likely prerenal.  Has significant AI and low DBP's. Also on Aldactone and losartan at home. -Continue holding Aldactone and losartan -Continue monitoring  Acute on chronic transaminitis: Improving.  CK within normal.  She is on amiodarone and statin which could contribute -Repeat CK level pending -Continue holding amiodarone and statin.   Elevated troponin: Likely demand ischemia but has LBBB.  No chest pain or dyspnea.   Chronic systolic CHF: TTE with LVEF of 40 to 45% (45 to 50% previously) and RWMA and valvular lesion as above.  No cardiopulmonary symptoms but fatigue.  Not on diuretics.  Appears euvolemic. -No further work-up. -Cardiology recommended as needed Lasix -Monitor fluid status and respiratory status  Chronic A. fib: Rate controlled.   On amiodarone and Eliquis at home. -Amiodarone held due to elevated LFT's -Continue telemetry monitoring -Resumed on Eliquis per cardiology  Hyponatremia: Na 128>>126>>130.  Was also on Aldactone and losartan at home may contribute.   TSH elevated to 12 but difficult to interpret in acute setting.  AM cortisol appropriately high. ?SIADH - consider trial of fluid restriction if  not improving Appears dry / hypovolemia?   -Continue holding Aldactone and losartan -Continue monitoring  Metabolic acidosis: Likely due to IV fluid.   -Continue monitoring  E. coli UTI-no new urinary  symptoms -On Rocephin as above  Normocytic anemia: Dilutional?  No sign of bleeding --monitor CBC  Hypothyroidism: TSH 12, but in setting of acute illness, will continue current dose. -Continue home Synthroid 50 mcg daily -Need repeat TSH in 4 to 6 weeks outpatient   Essential hypertension: DBP in 40s and 50s likely due to severe aortic regurgitation -Continue holding home meds  History of atrophic gastritis -Continue Protonix and Carafate  Peripheral neuropathy -Continue home gabapentin  Mild leukocytosis: Resolved. -Continue monitoring CBC  Debility/physical deconditioning -PT/OT-recommended CIR - their admin coordinator is following.  Appears insurance authorization underway.  Goal of care: Frail patient with significant cardiac issues now with bacteremia, endocarditis with aortic vegetation, vertebral fracture with possible discitis,  and not a surgical candidate.   --Palliative medicine consulted for goals of care, appreciate assistance. --Now DNR code status  Severe malnutrition: As demonstrated by hypoalbuminemia and significant muscle mass and subcutaneous fat loss Body mass index is 16.42 kg/m. Nutrition Problem: Severe Malnutrition Etiology: chronic illness (chronic lower back pain 2/2 lumbar spine OA) Signs/Symptoms: energy intake < or equal to 75% for > or equal to 1 month,severe fat depletion,severe muscle depletion,percent weight loss Percent weight loss: 6 % Interventions: Ensure Enlive (each supplement provides 350kcal and 20 grams of protein),MVI,Magic cup   Pressure skin injury: POA Pressure Injury 07/05/20 Coccyx Mid;Bilateral Stage 1 -  Intact skin with non-blanchable redness of a localized area usually over a bony prominence. RED/NON BLANCHING (Active)  07/05/20 0352  Location: Coccyx  Location Orientation: Mid;Bilateral  Staging: Stage 1 -  Intact skin with non-blanchable redness of a localized area usually over a bony prominence.  Wound  Description (Comments): RED/NON BLANCHING  Present on Admission: Yes     Patient BMI: Body mass index is 16.42 kg/m.   DVT prophylaxis: apixaban (ELIQUIS) tablet 2.5 mg Start: 07/08/20 1245 apixaban (ELIQUIS) tablet 2.5 mg   Diet:  Diet Orders (From admission, onward)    Start     Ordered   07/04/20 1309  Diet Heart Room service appropriate? Yes; Fluid consistency: Thin  Diet effective now       Question Answer Comment  Room service appropriate? Yes   Fluid consistency: Thin      07/04/20 1308            Code Status: DNR    Subjective 07/10/20    Pt seen with sister at bedside this AM.  Pt reports better sleep overnight.  Still having quite a lot of back pain, but says it goes to different places.  Appetite still poor but slightly better.    Disposition Plan & Communication   Status is: Inpatient  Inpatient status appropriate due to severity of illness on IV therapies as above.  Will require placement, CIR recommended.  Dispo: The patient is from: home              Anticipated d/c is to: CIR              Patient currently NOT medically stable for D/C.   Difficult to place patient No  Family Communication: sister at bedside on rounds today   Consults, Procedures, Significant Events   Consultants:   Infectious Disease  Cardiology  CT surgery  Interventional radiology  Palliative care  Neurosurgery   Antimicrobials:  Anti-infectives (From admission, onward)   Start     Dose/Rate Route Frequency Ordered Stop   07/08/20 1000  cefTRIAXone (ROCEPHIN) 2 g in sodium chloride 0.9 % 100 mL IVPB        2 g 200 mL/hr over 30 Minutes Intravenous Every 12 hours 07/08/20 0744     07/08/20 0800  ampicillin (OMNIPEN) 2 g in sodium chloride 0.9 % 100 mL IVPB       Note to Pharmacy: Ampicillin 2 g IV q8h for CrCL < 30 mL/min   2 g 300 mL/hr over 20 Minutes Intravenous Every 8 hours 07/08/20 0744     07/07/20 0915  DAPTOmycin (CUBICIN) 365 mg in sodium chloride 0.9  % IVPB  Status:  Discontinued        8 mg/kg  45.6 kg 214.6 mL/hr over 30 Minutes Intravenous Every 48 hours 07/07/20 0824 07/08/20 0744   07/07/20 0900  cefTRIAXone (ROCEPHIN) 2 g in sodium chloride 0.9 % 100 mL IVPB  Status:  Discontinued        2 g 200 mL/hr over 30 Minutes Intravenous Daily 07/07/20 0812 07/08/20 0744   07/05/20 1030  cefTRIAXone (ROCEPHIN) 1 g in sodium chloride 0.9 % 100 mL IVPB  Status:  Discontinued        1 g 200 mL/hr over 30 Minutes Intravenous Every 24 hours 07/04/20 1326 07/06/20 1133   07/04/20 1030  cefTRIAXone (ROCEPHIN) 1 g in sodium chloride 0.9 % 100 mL IVPB        1 g 200 mL/hr over 30 Minutes Intravenous  Once 07/04/20 1025 07/04/20 1107        Micro    Objective   Vitals:   07/09/20 2027 07/09/20 2200 07/10/20 0623 07/10/20 1434  BP: (!) 111/36  (!) 143/46 (!) 117/35  Pulse: (!) 52 60 (!) 56 (!) 50  Resp: 20  20   Temp: 97.7 F (36.5 C)  98.2 F (36.8 C) 98 F (36.7 C)  TempSrc: Oral  Oral Oral  SpO2: 95%  94% 97%  Weight:      Height:        Intake/Output Summary (Last 24 hours) at 07/10/2020 1724 Last data filed at 07/10/2020 0644 Gross per 24 hour  Intake 800 ml  Output -  Net 800 ml   Filed Weights   07/04/20 0620 07/05/20 0200 07/09/20 0531  Weight: 40 kg 45.6 kg 43.4 kg    Physical Exam:  General exam: awake, alert, no acute distress, frail, conversational Respiratory system: normal respiratory effort, on room air, symmetric chest rise. Cardiovascular system: RRR, systolic murmur, +JVP.   Central nervous system: A&O x. normal speech, grossly non-focal exam Extremities: no peripheral edema, normal tone Psychiatry: normal mood, congruent affect, judgement and insight appear normal  Labs   Data Reviewed: I have personally reviewed following labs and imaging studies  CBC: Recent Labs  Lab 07/06/20 0052 07/07/20 0358 07/08/20 0109 07/09/20 0054 07/10/20 0112  WBC 10.7* 9.9 9.9 12.0* 11.7*  NEUTROABS  --   --    --  10.2*  --   HGB 10.0* 11.0* 10.0* 9.8* 9.7*  HCT 29.3* 32.3* 30.0* 27.8* 28.6*  MCV 83.5 82.4 84.0 82.0 82.9  PLT 179 153 156 156 662   Basic Metabolic Panel: Recent Labs  Lab 07/05/20 0428 07/06/20 0052 07/07/20 0358 07/08/20 0109 07/09/20 0054 07/10/20 0112  NA 133* 130* 129* 128* 126* 130*  K 4.6 4.6 5.1 4.9 4.5  --   CL 106 105  103 104 103  --   CO2 18* 19* 22 18* 18*  --   GLUCOSE 94 180* 99 93 104*  --   BUN 37* 41* 39* 37* 31*  --   CREATININE 1.25* 1.34* 1.31* 1.30* 1.23*  --   CALCIUM 7.8* 7.4* 7.9* 7.5* 7.6*  --   MG  --   --  2.0 2.0 1.9  --   PHOS  --   --  2.8 3.3 3.2  --    GFR: Estimated Creatinine Clearance: 22.9 mL/min (A) (by C-G formula based on SCr of 1.23 mg/dL (H)). Liver Function Tests: Recent Labs  Lab 07/05/20 0428 07/06/20 0052 07/07/20 0358 07/08/20 0109 07/09/20 0054  AST 350* 260* 139* 96* 75*  ALT 333* 276* 208* 156* 138*  ALKPHOS 85 78 81 77 81  BILITOT 0.9 0.7 0.9 0.5 0.6  PROT 6.1* 4.8* 5.0* 4.6* 5.3*  ALBUMIN 2.3* 1.9* 2.0* 1.9* 2.1*   No results for input(s): LIPASE, AMYLASE in the last 168 hours. No results for input(s): AMMONIA in the last 168 hours. Coagulation Profile: Recent Labs  Lab 07/07/20 0358 07/08/20 0109  INR 1.7* 1.5*   Cardiac Enzymes: Recent Labs  Lab 07/04/20 1657  CKTOTAL 136   BNP (last 3 results) No results for input(s): PROBNP in the last 8760 hours. HbA1C: No results for input(s): HGBA1C in the last 72 hours. CBG: Recent Labs  Lab 07/04/20 0800  GLUCAP 105*   Lipid Profile: No results for input(s): CHOL, HDL, LDLCALC, TRIG, CHOLHDL, LDLDIRECT in the last 72 hours. Thyroid Function Tests: Recent Labs    07/08/20 0109  TSH 12.795*   Anemia Panel: No results for input(s): VITAMINB12, FOLATE, FERRITIN, TIBC, IRON, RETICCTPCT in the last 72 hours. Sepsis Labs: No results for input(s): PROCALCITON, LATICACIDVEN in the last 168 hours.  Recent Results (from the past 240 hour(s))   Urine culture     Status: Abnormal   Collection Time: 07/04/20  8:09 AM   Specimen: Urine, Random  Result Value Ref Range Status   Specimen Description URINE, RANDOM  Final   Special Requests   Final    NONE Performed at [facility], [address] [place], [place] [address]    Culture >=100,000 COLONIES/mL ESCHERICHIA COLI (A)  Final   Report Status 07/06/2020 FINAL  Final   Organism ID, Bacteria ESCHERICHIA COLI (A)  Final      Susceptibility   Escherichia coli - MIC*    AMPICILLIN 4 SENSITIVE Sensitive     CEFAZOLIN <=4 SENSITIVE Sensitive     CEFEPIME <=0.12 SENSITIVE Sensitive     CEFTRIAXONE <=0.25 SENSITIVE Sensitive     CIPROFLOXACIN <=0.25 SENSITIVE Sensitive     GENTAMICIN <=1 SENSITIVE Sensitive     IMIPENEM <=0.25 SENSITIVE Sensitive     NITROFURANTOIN <=16 SENSITIVE Sensitive     TRIMETH/SULFA >=320 RESISTANT Resistant     AMPICILLIN/SULBACTAM <=2 SENSITIVE Sensitive     PIP/TAZO <=4 SENSITIVE Sensitive     * >=100,000 COLONIES/mL ESCHERICHIA COLI  SARS CORONAVIRUS 2 (TAT 6-24 HRS) Nasopharyngeal Nasopharyngeal Swab     Status: None   Collection Time: 07/04/20 10:21 PM   Specimen: Nasopharyngeal Swab  Result Value Ref Range Status   SARS Coronavirus 2 NEGATIVE NEGATIVE Final    Comment: (NOTE) SARS-CoV-2 target nucleic acids are NOT DETECTED.  The SARS-CoV-2 RNA is generally detectable in upper and lower respiratory specimens during the acute phase of infection. Negative results do not preclude SARS-CoV-2 infection, do  not rule out co-infections with other pathogens, and should not be used as the sole basis for treatment or other patient management decisions. Negative results must be combined with clinical observations, patient history, and epidemiological information. The expected result is Negative.  Fact Sheet for Patients: SugarRoll.be  Fact Sheet for Healthcare  Providers: https://www.woods-mathews.com/  This test is not yet approved or cleared by the [place] FDA and  has been authorized for detection and/or diagnosis of SARS-CoV-2 by FDA under an Emergency Use Authorization (EUA). This EUA will remain  in effect (meaning this test can be used) for the duration of the COVID-19 declaration under Se ction 564(b)(1) of the Act, 21 U.S.C. section 360bbb-3(b)(1), unless the authorization is terminated or revoked sooner.  Performed at [facility], [place] [address] [place], [place] [address]   Culture, blood (routine x 2)     Status: Abnormal   Collection Time: 07/06/20 10:04 PM   Specimen: BLOOD  Result Value Ref Range Status   Specimen Description BLOOD LEFT ARM  Final   Special Requests   Final    BOTTLES DRAWN AEROBIC AND ANAEROBIC Blood Culture adequate volume   Culture  Setup Time   Final    GRAM POSITIVE COCCI IN CHAINS IN BOTH AEROBIC AND ANAEROBIC BOTTLES CRITICAL RESULT CALLED TO, READ BACK BY AND VERIFIED WITH: G. ABBOTT,PHARMD 0000 07/08/2020 T. TYSOR Performed at [facility], [place] [address] [place], [place] [address]    Culture ENTEROCOCCUS FAECALIS (A)  Final   Report Status 07/09/2020 FINAL  Final   Organism ID, Bacteria ENTEROCOCCUS FAECALIS  Final      Susceptibility   Enterococcus faecalis - MIC*    AMPICILLIN <=2 SENSITIVE Sensitive     VANCOMYCIN 1 SENSITIVE Sensitive     GENTAMICIN SYNERGY SENSITIVE Sensitive     * ENTEROCOCCUS FAECALIS  Blood Culture ID Panel (Reflexed)     Status: Abnormal   Collection Time: 07/06/20 10:04 PM  Result Value Ref Range Status   Enterococcus faecalis DETECTED (A) NOT DETECTED Final    Comment: CRITICAL RESULT CALLED TO, READ BACK BY AND VERIFIED WITH: G. ABBOTT,PHARMD 0000 07/08/2020 T. TYSOR    Enterococcus Faecium NOT DETECTED NOT DETECTED Final   Listeria monocytogenes NOT DETECTED NOT DETECTED Final   Staphylococcus species NOT DETECTED NOT DETECTED  Final   Staphylococcus aureus (BCID) NOT DETECTED NOT DETECTED Final   Staphylococcus epidermidis NOT DETECTED NOT DETECTED Final   Staphylococcus lugdunensis NOT DETECTED NOT DETECTED Final   Streptococcus species NOT DETECTED NOT DETECTED Final   Streptococcus agalactiae NOT DETECTED NOT DETECTED Final   Streptococcus pneumoniae NOT DETECTED NOT DETECTED Final   Streptococcus pyogenes NOT DETECTED NOT DETECTED Final   A.calcoaceticus-baumannii NOT DETECTED NOT DETECTED Final   Bacteroides fragilis NOT DETECTED NOT DETECTED Final   Enterobacterales NOT DETECTED NOT DETECTED Final   Enterobacter cloacae complex NOT DETECTED NOT DETECTED Final   Escherichia coli NOT DETECTED NOT DETECTED Final   Klebsiella aerogenes NOT DETECTED NOT DETECTED Final   Klebsiella oxytoca NOT DETECTED NOT DETECTED Final   Klebsiella pneumoniae NOT DETECTED NOT DETECTED Final   Proteus species NOT DETECTED NOT DETECTED Final   Salmonella species NOT DETECTED NOT DETECTED Final   Serratia marcescens NOT DETECTED NOT DETECTED Final   Haemophilus influenzae NOT DETECTED NOT DETECTED Final   Neisseria meningitidis NOT DETECTED NOT DETECTED Final   Pseudomonas aeruginosa NOT DETECTED NOT DETECTED Final   Stenotrophomonas maltophilia NOT DETECTED NOT DETECTED Final  Candida albicans NOT DETECTED NOT DETECTED Final   Candida auris NOT DETECTED NOT DETECTED Final   Candida glabrata NOT DETECTED NOT DETECTED Final   Candida krusei NOT DETECTED NOT DETECTED Final   Candida parapsilosis NOT DETECTED NOT DETECTED Final   Candida tropicalis NOT DETECTED NOT DETECTED Final   Cryptococcus neoformans/gattii NOT DETECTED NOT DETECTED Final   Vancomycin resistance NOT DETECTED NOT DETECTED Final    Comment: Performed at [place] [address] [place], [place] [address]  Culture, blood (routine x 2)     Status: Abnormal   Collection Time: 07/06/20 10:12 PM   Specimen: BLOOD  Result Value Ref Range Status    Specimen Description BLOOD RIGHT ARM  Final   Special Requests   Final    BOTTLES DRAWN AEROBIC ONLY Blood Culture adequate volume   Culture  Setup Time   Final    GRAM POSITIVE COCCI AEROBIC BOTTLE ONLY CRITICAL VALUE NOTED.  VALUE IS CONSISTENT WITH PREVIOUSLY REPORTED AND CALLED VALUE.    Culture (A)  Final    ENTEROCOCCUS FAECALIS SUSCEPTIBILITIES PERFORMED ON PREVIOUS CULTURE WITHIN THE LAST 5 DAYS. Performed at [facility], [place] [address] [place], [place] [address]    Report Status 07/09/2020 FINAL  Final  Culture, blood (routine x 2)     Status: Abnormal   Collection Time: 07/07/20  8:00 AM   Specimen: BLOOD  Result Value Ref Range Status   Specimen Description BLOOD LEFT ANTECUBITAL  Final   Special Requests   Final    BOTTLES DRAWN AEROBIC ONLY Blood Culture adequate volume   Culture  Setup Time   Final    GRAM POSITIVE COCCI IN CHAINS AEROBIC BOTTLE ONLY CRITICAL VALUE NOTED.  VALUE IS CONSISTENT WITH PREVIOUSLY REPORTED AND CALLED VALUE.    Culture (A)  Final    ENTEROCOCCUS FAECALIS SUSCEPTIBILITIES PERFORMED ON PREVIOUS CULTURE WITHIN THE LAST 5 DAYS. Performed at [facility], [place] [address] [place], [place] [address]    Report Status 07/09/2020 FINAL  Final  Culture, blood (routine x 2)     Status: Abnormal   Collection Time: 07/07/20  8:15 AM   Specimen: BLOOD  Result Value Ref Range Status   Specimen Description BLOOD RIGHT ANTECUBITAL  Final   Special Requests   Final    BOTTLES DRAWN AEROBIC ONLY Blood Culture adequate volume   Culture  Setup Time   Final    GRAM POSITIVE COCCI IN CHAINS AEROBIC BOTTLE ONLY CRITICAL VALUE NOTED.  VALUE IS CONSISTENT WITH PREVIOUSLY REPORTED AND CALLED VALUE.    Culture (A)  Final    ENTEROCOCCUS FAECALIS SUSCEPTIBILITIES PERFORMED ON PREVIOUS CULTURE WITHIN THE LAST 5 DAYS. Performed at [facility], [place] [address], [place], [place] [address]    Report Status 07/09/2020 FINAL  Final  Culture,  blood (routine x 2)     Status: None (Preliminary result)   Collection Time: 07/09/20  1:03 AM   Specimen: BLOOD  Result Value Ref Range Status   Specimen Description BLOOD RIGHT HAND  Final   Special Requests   Final    BOTTLES DRAWN AEROBIC AND ANAEROBIC Blood Culture adequate volume   Culture   Final    NO GROWTH 1 DAY Performed at [facility], [place] [address], [place], [place] [address]    Report Status PENDING  Incomplete  Culture, blood (routine x 2)     Status: None (Preliminary result)   Collection Time:  07/09/20  2:55 AM   Specimen: BLOOD  Result Value Ref Range Status   Specimen Description BLOOD RIGHT ARM  Final   Special Requests   Final    BOTTLES DRAWN AEROBIC AND ANAEROBIC Blood Culture adequate volume   Culture   Final    NO GROWTH 1 DAY Performed at [facility], [address], [place], [place] [address]    Report Status PENDING  Incomplete      Imaging Studies   No results found.   Medications   Scheduled Meds: . acetaminophen  1,000 mg Oral Q8H  . apixaban  2.5 mg Oral BID  . Chlorhexidine Gluconate Cloth  6 each Topical Daily  . feeding supplement  1 Container Oral TID BM  . ipratropium  1 spray Each Nare Q12H  . lactose free nutrition  237 mL Oral TID WC  . levothyroxine  50 mcg Oral QHS  . lidocaine  1 patch Transdermal Q24H  . metoprolol tartrate  12.5 mg Oral BID  . multivitamin with minerals  1 tablet Oral Daily  . polyethylene glycol  17 g Oral Daily  . saccharomyces boulardii  250 mg Oral BID   Continuous Infusions: . ampicillin (OMNIPEN) IV 2 g (07/10/20 0907)  . cefTRIAXone (ROCEPHIN)  IV 2 g (07/10/20 1007)       LOS: 6 days    Time spent: 30 minutes with > 50% spent at bedside and in coordination care.    Ezekiel Slocumb, DO [facility]  07/10/2020, 5:24 PM      If 7PM-7AM, please contact night-coverage. How to contact the [facility] Attending or Consulting provider [place] or covering provider during  after hours [place], for this patient?    1. Check the care team in [facility] and look for a) attending/consulting [facility] provider listed and b) the [facility] team listed 2. Log into [email or web address] and use [place]'s universal password to access. If you do not have the password, please contact the hospital operator. 3. Locate the [facility] provider you are looking for under [facility] and page to a number that you can be directly reached. 4. If you still have difficulty reaching the provider, please page the [facility] (Director on Call) for the Hospitalists listed on amion for assistance.

## 2020-07-10 NOTE — Progress Notes (Signed)
Subjective: She is still complaining of severe back pain  Antibiotics:  Anti-infectives (From admission, onward)   Start     Dose/Rate Route Frequency Ordered Stop   07/08/20 1000  cefTRIAXone (ROCEPHIN) 2 g in sodium chloride 0.9 % 100 mL IVPB        2 g 200 mL/hr over 30 Minutes Intravenous Every 12 hours 07/08/20 0744     07/08/20 0800  ampicillin (OMNIPEN) 2 g in sodium chloride 0.9 % 100 mL IVPB       Note to Pharmacy: Ampicillin 2 g IV q8h for CrCL < 30 mL/min   2 g 300 mL/hr over 20 Minutes Intravenous Every 8 hours 07/08/20 0744     07/07/20 0915  DAPTOmycin (CUBICIN) 365 mg in sodium chloride 0.9 % IVPB  Status:  Discontinued        8 mg/kg  45.6 kg 214.6 mL/hr over 30 Minutes Intravenous Every 48 hours 07/07/20 0824 07/08/20 0744   07/07/20 0900  cefTRIAXone (ROCEPHIN) 2 g in sodium chloride 0.9 % 100 mL IVPB  Status:  Discontinued        2 g 200 mL/hr over 30 Minutes Intravenous Daily 07/07/20 0812 07/08/20 0744   07/05/20 1030  cefTRIAXone (ROCEPHIN) 1 g in sodium chloride 0.9 % 100 mL IVPB  Status:  Discontinued        1 g 200 mL/hr over 30 Minutes Intravenous Every 24 hours 07/04/20 1326 07/06/20 1133   07/04/20 1030  cefTRIAXone (ROCEPHIN) 1 g in sodium chloride 0.9 % 100 mL IVPB        1 g 200 mL/hr over 30 Minutes Intravenous  Once 07/04/20 1025 07/04/20 1107      Medications: Scheduled Meds: . acetaminophen  1,000 mg Oral Q8H  . apixaban  2.5 mg Oral BID  . Chlorhexidine Gluconate Cloth  6 each Topical Daily  . feeding supplement  1 Container Oral TID BM  . ipratropium  1 spray Each Nare Q12H  . lactose free nutrition  237 mL Oral TID WC  . levothyroxine  50 mcg Oral QHS  . lidocaine  1 patch Transdermal Q24H  . metoprolol tartrate  12.5 mg Oral BID  . multivitamin with minerals  1 tablet Oral Daily  . polyethylene glycol  17 g Oral Daily  . saccharomyces boulardii  250 mg Oral BID   Continuous Infusions: . ampicillin (OMNIPEN) IV 2 g  (07/10/20 0907)  . cefTRIAXone (ROCEPHIN)  IV 2 g (07/10/20 1007)   PRN Meds:.HYDROmorphone (DILAUDID) injection, hyoscyamine, labetalol, lactase, lidocaine, oxyCODONE, sodium chloride, sucralfate, traMADol    Objective: Weight change:   Intake/Output Summary (Last 24 hours) at 07/10/2020 1525 Last data filed at 07/10/2020 0644 Gross per 24 hour  Intake 800 ml  Output -  Net 800 ml   Blood pressure (!) 117/35, pulse (!) 50, temperature 98 F (36.7 C), temperature source Oral, resp. rate 20, height 5\' 4"  (1.626 m), weight 43.4 kg, SpO2 97 %. Temp:  [97.7 F (36.5 C)-98.2 F (36.8 C)] 98 F (36.7 C) (03/15 1434) Pulse Rate:  [50-60] 50 (03/15 1434) Resp:  [20] 20 (03/15 0623) BP: (111-143)/(35-46) 117/35 (03/15 1434) SpO2:  [94 %-97 %] 97 % (03/15 1434)  Physical Exam: Physical Exam Constitutional:      Appearance: She is underweight.  Cardiovascular:     Rate and Rhythm: Tachycardia present.     Heart sounds: No murmur heard. No friction rub.  Pulmonary:  Effort: Pulmonary effort is normal. No respiratory distress.     Breath sounds: Normal breath sounds. No stridor. No wheezing or rhonchi.  Abdominal:     General: Bowel sounds are normal. There is no distension.     Palpations: There is no mass.  Neurological:     General: No focal deficit present.     Mental Status: She is alert and oriented to person, place, and time.  Psychiatric:        Attention and Perception: Attention normal.        Mood and Affect: Mood is anxious and depressed.        Speech: Speech normal.        Behavior: Behavior normal.        Thought Content: Thought content normal.        Cognition and Memory: Cognition normal.        Judgment: Judgment normal.      CBC:    BMET Recent Labs    07/08/20 0109 07/09/20 0054 07/10/20 0112  NA 128* 126* 130*  K 4.9 4.5  --   CL 104 103  --   CO2 18* 18*  --   GLUCOSE 93 104*  --   BUN 37* 31*  --   CREATININE 1.30* 1.23*  --    CALCIUM 7.5* 7.6*  --      Liver Panel  Recent Labs    07/08/20 0109 07/09/20 0054  PROT 4.6* 5.3*  ALBUMIN 1.9* 2.1*  AST 96* 75*  ALT 156* 138*  ALKPHOS 77 81  BILITOT 0.5 0.6       Sedimentation Rate No results for input(s): ESRSEDRATE in the last 72 hours. C-Reactive Protein No results for input(s): CRP in the last 72 hours.  Micro Results: Recent Results (from the past 720 hour(s))  Urine culture     Status: Abnormal   Collection Time: 07/04/20  8:09 AM   Specimen: Urine, Random  Result Value Ref Range Status   Specimen Description URINE, RANDOM  Final   Special Requests   Final    NONE Performed at [facility], [address], [place], [place] [address]    Culture >=100,000 COLONIES/mL ESCHERICHIA COLI (A)  Final   Report Status 07/06/2020 FINAL  Final   Organism ID, Bacteria ESCHERICHIA COLI (A)  Final      Susceptibility   Escherichia coli - MIC*    AMPICILLIN 4 SENSITIVE Sensitive     CEFAZOLIN <=4 SENSITIVE Sensitive     CEFEPIME <=0.12 SENSITIVE Sensitive     CEFTRIAXONE <=0.25 SENSITIVE Sensitive     CIPROFLOXACIN <=0.25 SENSITIVE Sensitive     GENTAMICIN <=1 SENSITIVE Sensitive     IMIPENEM <=0.25 SENSITIVE Sensitive     NITROFURANTOIN <=16 SENSITIVE Sensitive     TRIMETH/SULFA >=320 RESISTANT Resistant     AMPICILLIN/SULBACTAM <=2 SENSITIVE Sensitive     PIP/TAZO <=4 SENSITIVE Sensitive     * >=100,000 COLONIES/mL ESCHERICHIA COLI  SARS CORONAVIRUS 2 (TAT 6-24 HRS) Nasopharyngeal Nasopharyngeal Swab     Status: None   Collection Time: 07/04/20 10:21 PM   Specimen: Nasopharyngeal Swab  Result Value Ref Range Status   SARS Coronavirus 2 NEGATIVE NEGATIVE Final    Comment: (NOTE) SARS-CoV-2 target nucleic acids are NOT DETECTED.  The SARS-CoV-2 RNA is generally detectable in upper and lower respiratory specimens during the acute phase of infection. Negative results do not preclude SARS-CoV-2 infection, do not rule out co-infections  with other pathogens, and  should not be used as the sole basis for treatment or other patient management decisions. Negative results must be combined with clinical observations, patient history, and epidemiological information. The expected result is Negative.  Fact Sheet for Patients: SugarRoll.be  Fact Sheet for Healthcare Providers: https://www.woods-mathews.com/  This test is not yet approved or cleared by the [place] FDA and  has been authorized for detection and/or diagnosis of SARS-CoV-2 by FDA under an Emergency Use Authorization (EUA). This EUA will remain  in effect (meaning this test can be used) for the duration of the COVID-19 declaration under Se ction 564(b)(1) of the Act, 21 U.S.C. section 360bbb-3(b)(1), unless the authorization is terminated or revoked sooner.  Performed at [facility], [place] [address], [place], [place] [address]   Culture, blood (routine x 2)     Status: Abnormal   Collection Time: 07/06/20 10:04 PM   Specimen: BLOOD  Result Value Ref Range Status   Specimen Description BLOOD LEFT ARM  Final   Special Requests   Final    BOTTLES DRAWN AEROBIC AND ANAEROBIC Blood Culture adequate volume   Culture  Setup Time   Final    GRAM POSITIVE COCCI IN CHAINS IN BOTH AEROBIC AND ANAEROBIC BOTTLES CRITICAL RESULT CALLED TO, READ BACK BY AND VERIFIED WITH: G. ABBOTT,PHARMD 0000 07/08/2020 T. TYSOR Performed at [facility], [place] [address] [place], [place] [address]    Culture ENTEROCOCCUS FAECALIS (A)  Final   Report Status 07/09/2020 FINAL  Final   Organism ID, Bacteria ENTEROCOCCUS FAECALIS  Final      Susceptibility   Enterococcus faecalis - MIC*    AMPICILLIN <=2 SENSITIVE Sensitive     VANCOMYCIN 1 SENSITIVE Sensitive     GENTAMICIN SYNERGY SENSITIVE Sensitive     * ENTEROCOCCUS FAECALIS  Blood Culture ID Panel (Reflexed)     Status: Abnormal   Collection Time: 07/06/20 10:04 PM   Result Value Ref Range Status   Enterococcus faecalis DETECTED (A) NOT DETECTED Final    Comment: CRITICAL RESULT CALLED TO, READ BACK BY AND VERIFIED WITH: G. ABBOTT,PHARMD 0000 07/08/2020 T. TYSOR    Enterococcus Faecium NOT DETECTED NOT DETECTED Final   Listeria monocytogenes NOT DETECTED NOT DETECTED Final   Staphylococcus species NOT DETECTED NOT DETECTED Final   Staphylococcus aureus (BCID) NOT DETECTED NOT DETECTED Final   Staphylococcus epidermidis NOT DETECTED NOT DETECTED Final   Staphylococcus lugdunensis NOT DETECTED NOT DETECTED Final   Streptococcus species NOT DETECTED NOT DETECTED Final   Streptococcus agalactiae NOT DETECTED NOT DETECTED Final   Streptococcus pneumoniae NOT DETECTED NOT DETECTED Final   Streptococcus pyogenes NOT DETECTED NOT DETECTED Final   A.calcoaceticus-baumannii NOT DETECTED NOT DETECTED Final   Bacteroides fragilis NOT DETECTED NOT DETECTED Final   Enterobacterales NOT DETECTED NOT DETECTED Final   Enterobacter cloacae complex NOT DETECTED NOT DETECTED Final   Escherichia coli NOT DETECTED NOT DETECTED Final   Klebsiella aerogenes NOT DETECTED NOT DETECTED Final   Klebsiella oxytoca NOT DETECTED NOT DETECTED Final   Klebsiella pneumoniae NOT DETECTED NOT DETECTED Final   Proteus species NOT DETECTED NOT DETECTED Final   Salmonella species NOT DETECTED NOT DETECTED Final   Serratia marcescens NOT DETECTED NOT DETECTED Final   Haemophilus influenzae NOT DETECTED NOT DETECTED Final   Neisseria meningitidis NOT DETECTED NOT DETECTED Final   Pseudomonas aeruginosa NOT DETECTED NOT DETECTED Final   Stenotrophomonas maltophilia NOT DETECTED NOT DETECTED Final   Candida albicans NOT DETECTED NOT DETECTED Final  Candida auris NOT DETECTED NOT DETECTED Final   Candida glabrata NOT DETECTED NOT DETECTED Final   Candida krusei NOT DETECTED NOT DETECTED Final   Candida parapsilosis NOT DETECTED NOT DETECTED Final   Candida tropicalis NOT DETECTED NOT  DETECTED Final   Cryptococcus neoformans/gattii NOT DETECTED NOT DETECTED Final   Vancomycin resistance NOT DETECTED NOT DETECTED Final    Comment: Performed at [facility] [address], [place], [place] [address]  Culture, blood (routine x 2)     Status: Abnormal   Collection Time: 07/06/20 10:12 PM   Specimen: BLOOD  Result Value Ref Range Status   Specimen Description BLOOD RIGHT ARM  Final   Special Requests   Final    BOTTLES DRAWN AEROBIC ONLY Blood Culture adequate volume   Culture  Setup Time   Final    GRAM POSITIVE COCCI AEROBIC BOTTLE ONLY CRITICAL VALUE NOTED.  VALUE IS CONSISTENT WITH PREVIOUSLY REPORTED AND CALLED VALUE.    Culture (A)  Final    ENTEROCOCCUS FAECALIS SUSCEPTIBILITIES PERFORMED ON PREVIOUS CULTURE WITHIN THE LAST 5 DAYS. Performed at [facility], [place] [address] [place], [place] [address]    Report Status 07/09/2020 FINAL  Final  Culture, blood (routine x 2)     Status: Abnormal   Collection Time: 07/07/20  8:00 AM   Specimen: BLOOD  Result Value Ref Range Status   Specimen Description BLOOD LEFT ANTECUBITAL  Final   Special Requests   Final    BOTTLES DRAWN AEROBIC ONLY Blood Culture adequate volume   Culture  Setup Time   Final    GRAM POSITIVE COCCI IN CHAINS AEROBIC BOTTLE ONLY CRITICAL VALUE NOTED.  VALUE IS CONSISTENT WITH PREVIOUSLY REPORTED AND CALLED VALUE.    Culture (A)  Final    ENTEROCOCCUS FAECALIS SUSCEPTIBILITIES PERFORMED ON PREVIOUS CULTURE WITHIN THE LAST 5 DAYS. Performed at [facility], [place] [address], [place], [place] [address]    Report Status 07/09/2020 FINAL  Final  Culture, blood (routine x 2)     Status: Abnormal   Collection Time: 07/07/20  8:15 AM   Specimen: BLOOD  Result Value Ref Range Status   Specimen Description BLOOD RIGHT ANTECUBITAL  Final   Special Requests   Final    BOTTLES DRAWN AEROBIC ONLY Blood Culture adequate volume   Culture  Setup Time   Final    GRAM POSITIVE COCCI  IN CHAINS AEROBIC BOTTLE ONLY CRITICAL VALUE NOTED.  VALUE IS CONSISTENT WITH PREVIOUSLY REPORTED AND CALLED VALUE.    Culture (A)  Final    ENTEROCOCCUS FAECALIS SUSCEPTIBILITIES PERFORMED ON PREVIOUS CULTURE WITHIN THE LAST 5 DAYS. Performed at [facility], [place] [address] [place], [place] [address]    Report Status 07/09/2020 FINAL  Final  Culture, blood (routine x 2)     Status: None (Preliminary result)   Collection Time: 07/09/20  1:03 AM   Specimen: BLOOD  Result Value Ref Range Status   Specimen Description BLOOD RIGHT HAND  Final   Special Requests   Final    BOTTLES DRAWN AEROBIC AND ANAEROBIC Blood Culture adequate volume   Culture   Final    NO GROWTH 1 DAY Performed at [facility], [place] [address] [place], [place] [address]    Report Status PENDING  Incomplete  Culture, blood (routine x 2)     Status: None (Preliminary result)   Collection Time: 07/09/20  2:55 AM   Specimen: BLOOD  Result Value Ref Range Status   Specimen Description BLOOD RIGHT ARM  Final   Special Requests   Final    BOTTLES DRAWN AEROBIC AND ANAEROBIC Blood Culture adequate volume   Culture   Final    NO GROWTH 1 DAY Performed at [facility], [address], [place], [place] [address]    Report Status PENDING  Incomplete    Studies/Results: No results found.    Assessment/Plan:  INTERVAL HISTORY:   Blood cultures are no growth from Monday so far  Principal Problem:   Endocarditis of mitral valve Active Problems:   Hypothyroidism   Hyperlipidemia   Cervical spondylosis   Persistent atrial fibrillation ([place])   Fall at home, initial encounter   Fall   AKI (acute kidney injury) ([place])   Transaminitis   Pressure injury of skin   Protein-calorie malnutrition, severe   Acute lower UTI   Compression fracture of L1 vertebra ([place])   Bacteremia due to Enterococcus    [name] is a 87 y.o. female with recent syncopal episode fall low back pain with MRI  concerning for discitis.  In the interim her blood cultures have turned positive for Enterococcus faecalis and a transthoracic echocardiogram that orders have showed a large vegetation on the mitral valve.  She has not an operative candidate for cardiothoracic surgery.  Enterococcus faecalis endocarditis of mitral valve:  Continue dual beta-lactam therapy to complete 6 weeks of systemic antibiotics  I am concerned given the size of her vegetation that it may embolized to her central nervous system or kidneys.  We are repeating blood cultures to assure ourselves that her bloodstream is become sterile  She has been seen by palliative care.  Discitis: Undoubtably due to the same organism seen in her blood cultures   I spent greater than 35 minutes with the patient including greater than 50% of time in face to face counsel of the patient darting her diagnosis of endocarditis and how it is treated and in coordination of her care.      LOS: 6 days   Alcide Evener 07/10/2020, 3:25 PM

## 2020-07-10 NOTE — Progress Notes (Signed)
Inpatient Rehab Admissions Coordinator:    I met with Pt. For ongoing regarding potential CIR admission. Pt. States that she remains interested. I do not yet have insurance authorization but will open a case today.   Laura Staley, MS, CCC-SLP Rehab Admissions Coordinator  [phone] (celll) [phone] (office)  

## 2020-07-11 DIAGNOSIS — M4640 Discitis, unspecified, site unspecified: Secondary | ICD-10-CM

## 2020-07-11 DIAGNOSIS — I059 Rheumatic mitral valve disease, unspecified: Secondary | ICD-10-CM | POA: Diagnosis not present

## 2020-07-11 DIAGNOSIS — S32010S Wedge compression fracture of first lumbar vertebra, sequela: Secondary | ICD-10-CM

## 2020-07-11 DIAGNOSIS — M47812 Spondylosis without myelopathy or radiculopathy, cervical region: Secondary | ICD-10-CM | POA: Diagnosis not present

## 2020-07-11 DIAGNOSIS — R7881 Bacteremia: Secondary | ICD-10-CM | POA: Diagnosis not present

## 2020-07-11 DIAGNOSIS — N179 Acute kidney failure, unspecified: Secondary | ICD-10-CM | POA: Diagnosis not present

## 2020-07-11 LAB — CBC WITH DIFFERENTIAL/PLATELET
Abs Immature Granulocytes: 0.06 10*3/uL (ref 0.00–0.07)
Basophils Absolute: 0.1 10*3/uL (ref 0.0–0.1)
Basophils Relative: 1 %
Eosinophils Absolute: 0 10*3/uL (ref 0.0–0.5)
Eosinophils Relative: 0 %
HCT: 25.2 % — ABNORMAL LOW (ref 36.0–46.0)
Hemoglobin: 8.7 g/dL — ABNORMAL LOW (ref 12.0–15.0)
Immature Granulocytes: 1 %
Lymphocytes Relative: 7 %
Lymphs Abs: 0.7 10*3/uL (ref 0.7–4.0)
MCH: 28.2 pg (ref 26.0–34.0)
MCHC: 34.5 g/dL (ref 30.0–36.0)
MCV: 81.6 fL (ref 80.0–100.0)
Monocytes Absolute: 1 10*3/uL (ref 0.1–1.0)
Monocytes Relative: 10 %
Neutro Abs: 8.1 10*3/uL — ABNORMAL HIGH (ref 1.7–7.7)
Neutrophils Relative %: 81 %
Platelets: 143 10*3/uL — ABNORMAL LOW (ref 150–400)
RBC: 3.09 MIL/uL — ABNORMAL LOW (ref 3.87–5.11)
RDW: 15.9 % — ABNORMAL HIGH (ref 11.5–15.5)
WBC: 9.9 10*3/uL (ref 4.0–10.5)
nRBC: 0 % (ref 0.0–0.2)

## 2020-07-11 LAB — COMPREHENSIVE METABOLIC PANEL
ALT: 87 U/L — ABNORMAL HIGH (ref 0–44)
AST: 47 U/L — ABNORMAL HIGH (ref 15–41)
Albumin: 1.8 g/dL — ABNORMAL LOW (ref 3.5–5.0)
Alkaline Phosphatase: 77 U/L (ref 38–126)
Anion gap: 5 (ref 5–15)
BUN: 26 mg/dL — ABNORMAL HIGH (ref 8–23)
CO2: 20 mmol/L — ABNORMAL LOW (ref 22–32)
Calcium: 7.5 mg/dL — ABNORMAL LOW (ref 8.9–10.3)
Chloride: 103 mmol/L (ref 98–111)
Creatinine, Ser: 1.18 mg/dL — ABNORMAL HIGH (ref 0.44–1.00)
GFR, Estimated: 45 mL/min — ABNORMAL LOW (ref >60)
Glucose, Bld: 97 mg/dL (ref 70–99)
Potassium: 4.7 mmol/L (ref 3.5–5.1)
Sodium: 128 mmol/L — ABNORMAL LOW (ref 135–145)
Total Bilirubin: 0.7 mg/dL (ref 0.3–1.2)
Total Protein: 4.7 g/dL — ABNORMAL LOW (ref 6.5–8.1)

## 2020-07-11 LAB — PHOSPHORUS: Phosphorus: 3.1 mg/dL (ref 2.5–4.6)

## 2020-07-11 LAB — CK: Total CK: 49 U/L (ref 38–234)

## 2020-07-11 LAB — MAGNESIUM: Magnesium: 1.9 mg/dL (ref 1.7–2.4)

## 2020-07-11 NOTE — Progress Notes (Signed)
Subjective: No new complaints Antibiotics:  Anti-infectives (From admission, onward)   Start     Dose/Rate Route Frequency Ordered Stop   07/08/20 1000  cefTRIAXone (ROCEPHIN) 2 g in sodium chloride 0.9 % 100 mL IVPB        2 g 200 mL/hr over 30 Minutes Intravenous Every 12 hours 07/08/20 0744     07/08/20 0800  ampicillin (OMNIPEN) 2 g in sodium chloride 0.9 % 100 mL IVPB       Note to Pharmacy: Ampicillin 2 g IV q8h for CrCL < 30 mL/min   2 g 300 mL/hr over 20 Minutes Intravenous Every 8 hours 07/08/20 0744     07/07/20 0915  DAPTOmycin (CUBICIN) 365 mg in sodium chloride 0.9 % IVPB  Status:  Discontinued        8 mg/kg  45.6 kg 214.6 mL/hr over 30 Minutes Intravenous Every 48 hours 07/07/20 0824 07/08/20 0744   07/07/20 0900  cefTRIAXone (ROCEPHIN) 2 g in sodium chloride 0.9 % 100 mL IVPB  Status:  Discontinued        2 g 200 mL/hr over 30 Minutes Intravenous Daily 07/07/20 0812 07/08/20 0744   07/05/20 1030  cefTRIAXone (ROCEPHIN) 1 g in sodium chloride 0.9 % 100 mL IVPB  Status:  Discontinued        1 g 200 mL/hr over 30 Minutes Intravenous Every 24 hours 07/04/20 1326 07/06/20 1133   07/04/20 1030  cefTRIAXone (ROCEPHIN) 1 g in sodium chloride 0.9 % 100 mL IVPB        1 g 200 mL/hr over 30 Minutes Intravenous  Once 07/04/20 1025 07/04/20 1107      Medications: Scheduled Meds: . acetaminophen  1,000 mg Oral Q8H  . apixaban  2.5 mg Oral BID  . Chlorhexidine Gluconate Cloth  6 each Topical Daily  . feeding supplement  1 Container Oral TID BM  . ipratropium  1 spray Each Nare Q12H  . lactose free nutrition  237 mL Oral TID WC  . levothyroxine  50 mcg Oral QHS  . lidocaine  1 patch Transdermal Q24H  . metoprolol tartrate  12.5 mg Oral BID  . multivitamin with minerals  1 tablet Oral Daily  . polyethylene glycol  17 g Oral Daily  . saccharomyces boulardii  250 mg Oral BID   Continuous Infusions: . ampicillin (OMNIPEN) IV 2 g (07/11/20 1503)  . cefTRIAXone  (ROCEPHIN)  IV 2 g (07/11/20 1113)   PRN Meds:.HYDROmorphone (DILAUDID) injection, hyoscyamine, labetalol, lactase, lidocaine, oxyCODONE, sodium chloride, sucralfate, traMADol    Objective: Weight change:   Intake/Output Summary (Last 24 hours) at 07/11/2020 1543 Last data filed at 07/11/2020 1400 Gross per 24 hour  Intake 320 ml  Output 252 ml  Net 68 ml   Blood pressure (!) 106/40, pulse (!) 50, temperature 98.3 F (36.8 C), temperature source Oral, resp. rate 18, height 5\' 4"  (1.626 m), weight 43.4 kg, SpO2 96 %. Temp:  [98 F (36.7 C)-98.7 F (37.1 C)] 98.3 F (36.8 C) (03/16 1405) Pulse Rate:  [50-53] 50 (03/16 1405) Resp:  [14-20] 18 (03/16 1508) BP: (106-135)/(40-46) 106/40 (03/16 1405) SpO2:  [91 %-96 %] 96 % (03/16 1405)  Physical Exam: Physical Exam Constitutional:      Appearance: She is underweight.  Cardiovascular:     Rate and Rhythm: Tachycardia present.     Heart sounds: No murmur heard. No friction rub.  Pulmonary:     Effort: Pulmonary effort is normal.  No respiratory distress.     Breath sounds: Normal breath sounds. No stridor. No wheezing or rhonchi.  Abdominal:     General: Bowel sounds are normal. There is no distension.     Palpations: There is no mass.  Neurological:     General: No focal deficit present.     Mental Status: She is alert and oriented to person, place, and time.  Psychiatric:        Attention and Perception: Attention normal.        Mood and Affect: Mood is anxious and depressed.        Speech: Speech normal.        Behavior: Behavior normal.        Thought Content: Thought content normal.        Cognition and Memory: Cognition normal.        Judgment: Judgment normal.      CBC:    BMET Recent Labs    07/09/20 0054 07/10/20 0112 07/11/20 0059  NA 126* 130* 128*  K 4.5  --  4.7  CL 103  --  103  CO2 18*  --  20*  GLUCOSE 104*  --  97  BUN 31*  --  26*  CREATININE 1.23*  --  1.18*  CALCIUM 7.6*  --  7.5*      Liver Panel  Recent Labs    07/09/20 0054 07/11/20 0059  PROT 5.3* 4.7*  ALBUMIN 2.1* 1.8*  AST 75* 47*  ALT 138* 87*  ALKPHOS 81 77  BILITOT 0.6 0.7       Sedimentation Rate No results for input(s): ESRSEDRATE in the last 72 hours. C-Reactive Protein No results for input(s): CRP in the last 72 hours.  Micro Results: Recent Results (from the past 720 hour(s))  Urine culture     Status: Abnormal   Collection Time: 07/04/20  8:09 AM   Specimen: Urine, Random  Result Value Ref Range Status   Specimen Description URINE, RANDOM  Final   Special Requests   Final    NONE Performed at [facility], [address] [place], [place] [address]    Culture >=100,000 COLONIES/mL ESCHERICHIA COLI (A)  Final   Report Status 07/06/2020 FINAL  Final   Organism ID, Bacteria ESCHERICHIA COLI (A)  Final      Susceptibility   Escherichia coli - MIC*    AMPICILLIN 4 SENSITIVE Sensitive     CEFAZOLIN <=4 SENSITIVE Sensitive     CEFEPIME <=0.12 SENSITIVE Sensitive     CEFTRIAXONE <=0.25 SENSITIVE Sensitive     CIPROFLOXACIN <=0.25 SENSITIVE Sensitive     GENTAMICIN <=1 SENSITIVE Sensitive     IMIPENEM <=0.25 SENSITIVE Sensitive     NITROFURANTOIN <=16 SENSITIVE Sensitive     TRIMETH/SULFA >=320 RESISTANT Resistant     AMPICILLIN/SULBACTAM <=2 SENSITIVE Sensitive     PIP/TAZO <=4 SENSITIVE Sensitive     * >=100,000 COLONIES/mL ESCHERICHIA COLI  SARS CORONAVIRUS 2 (TAT 6-24 HRS) Nasopharyngeal Nasopharyngeal Swab     Status: None   Collection Time: 07/04/20 10:21 PM   Specimen: Nasopharyngeal Swab  Result Value Ref Range Status   SARS Coronavirus 2 NEGATIVE NEGATIVE Final    Comment: (NOTE) SARS-CoV-2 target nucleic acids are NOT DETECTED.  The SARS-CoV-2 RNA is generally detectable in upper and lower respiratory specimens during the acute phase of infection. Negative results do not preclude SARS-CoV-2 infection, do not rule out co-infections with other pathogens, and  should not be used as  the sole basis for treatment or other patient management decisions. Negative results must be combined with clinical observations, patient history, and epidemiological information. The expected result is Negative.  Fact Sheet for Patients: SugarRoll.be  Fact Sheet for Healthcare Providers: https://www.woods-mathews.com/  This test is not yet approved or cleared by the [place] FDA and  has been authorized for detection and/or diagnosis of SARS-CoV-2 by FDA under an Emergency Use Authorization (EUA). This EUA will remain  in effect (meaning this test can be used) for the duration of the COVID-19 declaration under Se ction 564(b)(1) of the Act, 21 U.S.C. section 360bbb-3(b)(1), unless the authorization is terminated or revoked sooner.  Performed at [facility], [place] [address], [place], [place] [address]   Culture, blood (routine x 2)     Status: Abnormal   Collection Time: 07/06/20 10:04 PM   Specimen: BLOOD  Result Value Ref Range Status   Specimen Description BLOOD LEFT ARM  Final   Special Requests   Final    BOTTLES DRAWN AEROBIC AND ANAEROBIC Blood Culture adequate volume   Culture  Setup Time   Final    GRAM POSITIVE COCCI IN CHAINS IN BOTH AEROBIC AND ANAEROBIC BOTTLES CRITICAL RESULT CALLED TO, READ BACK BY AND VERIFIED WITH: G. ABBOTT,PHARMD 0000 07/08/2020 T. TYSOR Performed at [facility], [place] [address] [place], [place] [address]    Culture ENTEROCOCCUS FAECALIS (A)  Final   Report Status 07/09/2020 FINAL  Final   Organism ID, Bacteria ENTEROCOCCUS FAECALIS  Final      Susceptibility   Enterococcus faecalis - MIC*    AMPICILLIN <=2 SENSITIVE Sensitive     VANCOMYCIN 1 SENSITIVE Sensitive     GENTAMICIN SYNERGY SENSITIVE Sensitive     * ENTEROCOCCUS FAECALIS  Blood Culture ID Panel (Reflexed)     Status: Abnormal   Collection Time: 07/06/20 10:04 PM  Result Value Ref Range Status    Enterococcus faecalis DETECTED (A) NOT DETECTED Final    Comment: CRITICAL RESULT CALLED TO, READ BACK BY AND VERIFIED WITH: G. ABBOTT,PHARMD 0000 07/08/2020 T. TYSOR    Enterococcus Faecium NOT DETECTED NOT DETECTED Final   Listeria monocytogenes NOT DETECTED NOT DETECTED Final   Staphylococcus species NOT DETECTED NOT DETECTED Final   Staphylococcus aureus (BCID) NOT DETECTED NOT DETECTED Final   Staphylococcus epidermidis NOT DETECTED NOT DETECTED Final   Staphylococcus lugdunensis NOT DETECTED NOT DETECTED Final   Streptococcus species NOT DETECTED NOT DETECTED Final   Streptococcus agalactiae NOT DETECTED NOT DETECTED Final   Streptococcus pneumoniae NOT DETECTED NOT DETECTED Final   Streptococcus pyogenes NOT DETECTED NOT DETECTED Final   A.calcoaceticus-baumannii NOT DETECTED NOT DETECTED Final   Bacteroides fragilis NOT DETECTED NOT DETECTED Final   Enterobacterales NOT DETECTED NOT DETECTED Final   Enterobacter cloacae complex NOT DETECTED NOT DETECTED Final   Escherichia coli NOT DETECTED NOT DETECTED Final   Klebsiella aerogenes NOT DETECTED NOT DETECTED Final   Klebsiella oxytoca NOT DETECTED NOT DETECTED Final   Klebsiella pneumoniae NOT DETECTED NOT DETECTED Final   Proteus species NOT DETECTED NOT DETECTED Final   Salmonella species NOT DETECTED NOT DETECTED Final   Serratia marcescens NOT DETECTED NOT DETECTED Final   Haemophilus influenzae NOT DETECTED NOT DETECTED Final   Neisseria meningitidis NOT DETECTED NOT DETECTED Final   Pseudomonas aeruginosa NOT DETECTED NOT DETECTED Final   Stenotrophomonas maltophilia NOT DETECTED NOT DETECTED Final   Candida albicans NOT DETECTED NOT DETECTED Final   Candida auris NOT  DETECTED NOT DETECTED Final   Candida glabrata NOT DETECTED NOT DETECTED Final   Candida krusei NOT DETECTED NOT DETECTED Final   Candida parapsilosis NOT DETECTED NOT DETECTED Final   Candida tropicalis NOT DETECTED NOT DETECTED Final   Cryptococcus  neoformans/gattii NOT DETECTED NOT DETECTED Final   Vancomycin resistance NOT DETECTED NOT DETECTED Final    Comment: Performed at [facility] [address], [place], [place] [address]  Culture, blood (routine x 2)     Status: Abnormal   Collection Time: 07/06/20 10:12 PM   Specimen: BLOOD  Result Value Ref Range Status   Specimen Description BLOOD RIGHT ARM  Final   Special Requests   Final    BOTTLES DRAWN AEROBIC ONLY Blood Culture adequate volume   Culture  Setup Time   Final    GRAM POSITIVE COCCI AEROBIC BOTTLE ONLY CRITICAL VALUE NOTED.  VALUE IS CONSISTENT WITH PREVIOUSLY REPORTED AND CALLED VALUE.    Culture (A)  Final    ENTEROCOCCUS FAECALIS SUSCEPTIBILITIES PERFORMED ON PREVIOUS CULTURE WITHIN THE LAST 5 DAYS. Performed at [facility], [place] [address], [place], [place] [address]    Report Status 07/09/2020 FINAL  Final  Culture, blood (routine x 2)     Status: Abnormal   Collection Time: 07/07/20  8:00 AM   Specimen: BLOOD  Result Value Ref Range Status   Specimen Description BLOOD LEFT ANTECUBITAL  Final   Special Requests   Final    BOTTLES DRAWN AEROBIC ONLY Blood Culture adequate volume   Culture  Setup Time   Final    GRAM POSITIVE COCCI IN CHAINS AEROBIC BOTTLE ONLY CRITICAL VALUE NOTED.  VALUE IS CONSISTENT WITH PREVIOUSLY REPORTED AND CALLED VALUE.    Culture (A)  Final    ENTEROCOCCUS FAECALIS SUSCEPTIBILITIES PERFORMED ON PREVIOUS CULTURE WITHIN THE LAST 5 DAYS. Performed at [facility], [place] [address] [place], [place] [address]    Report Status 07/09/2020 FINAL  Final  Culture, blood (routine x 2)     Status: Abnormal   Collection Time: 07/07/20  8:15 AM   Specimen: BLOOD  Result Value Ref Range Status   Specimen Description BLOOD RIGHT ANTECUBITAL  Final   Special Requests   Final    BOTTLES DRAWN AEROBIC ONLY Blood Culture adequate volume   Culture  Setup Time   Final    GRAM POSITIVE COCCI IN CHAINS AEROBIC BOTTLE  ONLY CRITICAL VALUE NOTED.  VALUE IS CONSISTENT WITH PREVIOUSLY REPORTED AND CALLED VALUE.    Culture (A)  Final    ENTEROCOCCUS FAECALIS SUSCEPTIBILITIES PERFORMED ON PREVIOUS CULTURE WITHIN THE LAST 5 DAYS. Performed at [facility], [place] [address] [place], [place] [address]    Report Status 07/09/2020 FINAL  Final  Culture, blood (routine x 2)     Status: None (Preliminary result)   Collection Time: 07/09/20  1:03 AM   Specimen: BLOOD  Result Value Ref Range Status   Specimen Description BLOOD RIGHT HAND  Final   Special Requests   Final    BOTTLES DRAWN AEROBIC AND ANAEROBIC Blood Culture adequate volume   Culture   Final    NO GROWTH 2 DAYS Performed at [facility], [place] [address] [place], [place] [address]    Report Status PENDING  Incomplete  Culture, blood (routine x 2)     Status: None (Preliminary result)   Collection Time: 07/09/20  2:55 AM   Specimen: BLOOD  Result Value Ref  Range Status   Specimen Description BLOOD RIGHT ARM  Final   Special Requests   Final    BOTTLES DRAWN AEROBIC AND ANAEROBIC Blood Culture adequate volume   Culture   Final    NO GROWTH 2 DAYS Performed at [facility], [address] [place], [place] [address]    Report Status PENDING  Incomplete    Studies/Results: No results found.    Assessment/Plan:  INTERVAL HISTORY:   Blood cultures are no growth from Monday x 2 days  Principal Problem:   Endocarditis of mitral valve Active Problems:   Hypothyroidism   Hyperlipidemia   Cervical spondylosis   Persistent atrial fibrillation ([place])   Fall at home, initial encounter   Fall   AKI (acute kidney injury) ([place])   Transaminitis   Pressure injury of skin   Protein-calorie malnutrition, severe   Acute lower UTI   Compression fracture of L1 vertebra ([place])   Bacteremia due to Enterococcus    DEITRA CRAINE is a 87 y.o. female with recent syncopal episode fall low back pain with MRI concerning for  discitis.  In the interim her blood cultures have turned positive for Enterococcus faecalis and a transthoracic echocardiogram that orders have showed a large vegetation on the mitral valve.  She has not an operative candidate for cardiothoracic surgery.  Enterococcus faecalis endocarditis of mitral valve:  Continue dual beta-lactam therapy to complete 6 weeks of systemic antibiotics  I am concerned given the size of her vegetation that it may embolized to her central nervous system or kidneys.  We are repeating blood cultures to assure ourselves that her bloodstream is become sterile    Discitis: Undoubtably due to the same organism seen in her blood cultures      LOS: 7 days   Alcide Evener 07/11/2020, 3:43 PM

## 2020-07-11 NOTE — Progress Notes (Signed)
Inpatient Rehab Admissions Coordinator:   I do not have insurance authorization or a bed for Pt. On CIR today. I will follow for potential admit pending insurance auth and bed availability.   Clemens Catholic, [place], [place] Admissions Coordinator  [phone] ([place]) [phone] (office)

## 2020-07-11 NOTE — Progress Notes (Signed)
Physical Therapy Treatment Patient Details Name: [name] MRN: [id number] DOB: 07-13-1932 Today's Date: 07/11/2020    History of Present Illness 87 y.o. female presented to ED 3/9 after fall and question of near syncope. CT lumbar spine reveals L1 acute compression fx. Admitted for treatment of possible syncope, L1 fx, Worsening of chronic transaminases and UTI. Pt reports numbness and tingling in bilateral LE R>L, bladder and bowel incontinence. TLSO for OOB Found to have mitral valv vegetation  PMH: chronic back pain secondary to lumbar spine OA, chronic ambulation dysfunction, CAD status post CABG, chronic A. fib on Eliquis, HLD, aortic regurgitation and mitral regurgitation,    PT Comments    Pt supine in bed on entry, very distraught about not being able to go to CIR. Pt concerned about having to go home having husband take care of her. Pt reports that insurance is not going to cover CIR. Pt upset that no one has been able to get her up with her TLSO. Focus of session, education for pt and husband on how to place brace so she can be more independent. Pt continues to require prompting with coming to seated through sidelying to decrease twisting. Pt supervision for bed mobility, min guard for transfers and light min A for stepping to recliner. RN in room at end of session with pain medication. PT continues to recommend CIR level rehab to progress mobility. PT will continue to follow acutely.   Follow Up Recommendations  CIR     Equipment Recommendations  Wheelchair (measurements PT);Wheelchair cushion (measurements PT)    Recommendations for Other Services Rehab consult     Precautions / Restrictions Precautions Precautions: Fall;Back Precaution Booklet Issued: Yes (comment) Precaution Comments: spinal brace for OOB donned in seated, educated RN to proper don and dof, RN able to complete independently Required Braces or Orthoses: Spinal Brace Spinal Brace: Thoracolumbosacral  orthotic Restrictions Weight Bearing Restrictions: No    Mobility  Bed Mobility Overal bed mobility: Needs Assistance Bed Mobility: Rolling;Supine to Sit Rolling: Supervision Sidelying to sit: Supervision       General bed mobility comments: supervision for rolling onto L side, continues to require maximal cuing for pushing up through elbow to come to sitti    Transfers Overall transfer level: Needs assistance Equipment used: 1 person hand held assist Transfers: Sit to/from [employer] Transfers Sit to Stand: Min guard         General transfer comment: good power up, increased time and effort to steady but no outside assist required  Ambulation/Gait Ambulation/Gait assistance: Min assist Gait Distance (Feet): 3 Feet Assistive device: 1 person hand held assist Gait Pattern/deviations: Step-through pattern;Decreased step length - right;Decreased step length - left;Trunk flexed;Shuffle Gait velocity: slowed Gait velocity interpretation: <1.31 ft/sec, indicative of household ambulator General Gait Details: light min A for very short steps to recliner       Balance Overall balance assessment: Needs assistance;History of Falls Sitting-balance support: Feet supported;Single extremity supported;Bilateral upper extremity supported Sitting balance-Leahy Scale: Fair     Standing balance support: Bilateral upper extremity supported;During functional activity Standing balance-Leahy Scale: Poor Standing balance comment: reliant on UE support                            Cognition Arousal/Alertness: Awake/alert Behavior During Therapy: WFL for tasks assessed/performed;Anxious Overall Cognitive Status: Within Functional Limits for tasks assessed  General Comments General comments (skin integrity, edema, etc.): VSS on RA, educated pt and husband on proper donning and doffing of TLSO, husband able to put  brace on with cuing from pt      Pertinent Vitals/Pain Pain Assessment: 0-10 Pain Score: 6  Pain Location: back with movement Pain Descriptors / Indicators: Discomfort;Grimacing;Moaning Pain Intervention(s): Limited activity within patient's tolerance;Monitored during session;Repositioned           PT Goals (current goals can now be found in the care plan section) Acute Rehab PT Goals Patient Stated Goal: be able to do for myself, get stronger PT Goal Formulation: With patient/family Time For Goal Achievement: 07/19/20 Potential to Achieve Goals: Fair Progress towards PT goals: Progressing toward goals    Frequency    Min 3X/week      PT Plan Current plan remains appropriate       AM-PAC PT "6 Clicks" Mobility   Outcome Measure  Help needed turning from your back to your side while in a flat bed without using bedrails?: A Little Help needed moving from lying on your back to sitting on the side of a flat bed without using bedrails?: A Lot Help needed moving to and from a bed to a chair (including a wheelchair)?: A Little Help needed standing up from a chair using your arms (e.g., wheelchair or bedside chair)?: A Little Help needed to walk in hospital room?: A Lot Help needed climbing 3-5 steps with a railing? : Total 6 Click Score: 14    End of Session Equipment Utilized During Treatment: Back brace Activity Tolerance: Other (comment) Patient left: in chair;with call bell/phone within reach;with nursing/sitter in room Nurse Communication: Mobility status;Other (comment) (TLSO donning and doffing) PT Visit Diagnosis: Unsteadiness on feet (R26.81);Other abnormalities of gait and mobility (R26.89);Muscle weakness (generalized) (M62.81);Difficulty in walking, not elsewhere classified (R26.2);Other symptoms and signs involving the nervous system (R29.898);Pain     Time: 5277-8242 PT Time Calculation (min) (ACUTE ONLY): 24 min  Charges:  $Therapeutic Activity: 8-22  mins $Orthotics Fit Training: 8-22 mins                     Hadiyah Maricle B. Migdalia Dk PT, DPT Acute Rehabilitation Services Pager [phone] Office [phone]    [place] 07/11/2020, 3:43 PM

## 2020-07-11 NOTE — PMR Pre-admission (Shared)
PMR Admission Coordinator Pre-Admission Assessment  Patient: [name] is an 87 y.o., female MRN: [id number] DOB: 1933-01-11 Height: 5' 4"  (162.6 cm) Weight: 43.4 kg  Insurance Information HMO:     PPO: yes     PCP:      IPA:      80/20:      OTHER:  PRIMARY: U[employer] Medicare       Policy#: [id number]      Subscriber: Pt.  CM Name: ***      Phone#: ***     Fax#: *** Pre-Cert#: [id number]     Employer: *** Benefits:  Phone #: ***     Name: *** Phone: Online - [email or web address] Eff Date: 04/28/2020 - present Deductible: $0 (does not have deductible) OOP Max: $3,900 ($148.13 met) CIR: $295/day co-pay for days 1-5, $0/day co-pay for days 6+ SNF: $0.00 Copayment per day for days 1-20; $188.00 Copayment per day for days 21-41; $0.00 Copayment per day for days 42-100 for Medicare-covered care/maximum 100 days/benefit period Outpatient: $20/visit co-pay Home Health:  100% coverage, 0% co-insurance; limited by medical necessity DME: 80% coverage; 20% co-insurance  SECONDARY: none       Policy#:      Phone#:   Development worker, community:       Phone#:    The Therapist, art Information Summary" for patients in Inpatient Rehabilitation Facilities with attached "Privacy Act [place] Records" was provided and verbally reviewed with: Patient  Emergency Contact Information Contact Information    Name Relation Home Work Mobile   [place] Spouse [phone]    Leward Quan   [phone]      Current Medical History  Patient Admitting Diagnosis: Endocarditis and L1 fracture  History of Present Illness: 87 y.o. female presented to ED 3/9 after fall and question of near syncope. CT lumbar spine reveals L1 acute compression fx. Admitted for treatment of possible syncope, L1 fx, Worsening of chronic transaminases and UTI. Pt reports numbness and tingling in bilateral LE R>L, bladder and bowel incontinence. TLSO for OOB Found to have mitral valv vegetation  PMH:  chronic back pain secondary to lumbar spine OA, chronic ambulation dysfunction, CAD status post CABG, chronic A. fib on Eliquis, HLD, aortic regurgitation and mitral regurgitation,    Patient's medical record from [facility]  has been reviewed by the rehabilitation admission coordinator and physician.  Past Medical History  Past Medical History:  Diagnosis Date  . Allergy   . Anemia, pernicious   . Arthritis    bursitis   . Atrophic gastritis   . B12 deficiency   . Bacteremia due to Enterococcus 07/09/2020  . CAD (coronary artery disease)   . Carotid artery stenosis   . Cataract   . Diarrhea    on and off for months  . Family history of adverse reaction to anesthesia    extreme nausea brother and sister  . Fibromyalgia   . Gallbladder polyp   . GERD (gastroesophageal reflux disease)   . Heart murmur   . History of hiatal hernia   . History of kidney stones   . HLD (hyperlipidemia)   . HTN (hypertension)   . Hypothyroidism   . Irritable bowel syndrome (IBS)   . Median arcuate ligament syndrome ([place])   . Osteoporosis   . PVD (peripheral vascular disease) ([place])   . Tubular adenoma of colon 08/2013   with focal high grade dysplasia    Family History   family history includes Breast cancer  in an other family member; Cholelithiasis in her mother; Colon cancer in her brother; Diabetes in her sister; Heart disease in her mother; Lung cancer in her father and sister; Prostate cancer in her brother; Stomach cancer in her cousin.  Prior Rehab/Hospitalizations Has the patient had prior rehab or hospitalizations prior to admission? No  Has the patient had major surgery during 100 days prior to admission? Yes   Current Medications  Current Facility-Administered Medications:  .  acetaminophen (TYLENOL) tablet 1,000 mg, 1,000 mg, Oral, Q8H, Nicole Kindred A, DO, 1,000 mg at 07/11/20 0522 .  ampicillin (OMNIPEN) 2 g in sodium chloride 0.9 % 100 mL IVPB, 2 g,  Intravenous, Q8H, Manandhar, Sabina, MD, Last Rate: 300 mL/hr at 07/11/20 1028, 2 g at 07/11/20 1028 .  apixaban (ELIQUIS) tablet 2.5 mg, 2.5 mg, Oral, BID, Henri Medal, RPH, 2.5 mg at 07/11/20 1019 .  cefTRIAXone (ROCEPHIN) 2 g in sodium chloride 0.9 % 100 mL IVPB, 2 g, Intravenous, Q12H, Manandhar, Sabina, MD, Last Rate: 200 mL/hr at 07/11/20 1113, 2 g at 07/11/20 1113 .  Chlorhexidine Gluconate Cloth 2 % PADS 6 each, 6 each, Topical, Daily, Lequita Halt, MD, 6 each at 07/11/20 1019 .  feeding supplement (BOOST / RESOURCE BREEZE) liquid 1 Container, 1 Container, Oral, TID BM, Nicole Kindred A, DO, 1 Container at 07/11/20 1020 .  HYDROmorphone (DILAUDID) injection 0.5 mg, 0.5 mg, Intravenous, Q2H PRN, Nicole Kindred A, DO, 0.5 mg at 07/11/20 0551 .  hyoscyamine (LEVSIN SL) SL tablet 0.125 mg, 0.125 mg, Sublingual, Q6H PRN, Wynetta Fines T, MD .  ipratropium (ATROVENT) 0.06 % nasal spray 1 spray, 1 spray, Each Nare, Q12H, Lequita Halt, MD, 1 spray at 07/11/20 1019 .  labetalol (NORMODYNE) tablet 100 mg, 100 mg, Oral, TID PRN, Wynetta Fines T, MD .  lactase (LACTAID) tablet 3,000 Units, 3,000 Units, Oral, TID WC PRN, Wynetta Fines T, MD .  lactose free nutrition (BOOST PLUS) liquid 237 mL, 237 mL, Oral, TID WC, Gonfa, Taye T, MD, 237 mL at 07/11/20 1113 .  levothyroxine (SYNTHROID) tablet 50 mcg, 50 mcg, Oral, QHS, Lequita Halt, MD, 50 mcg at 07/10/20 2113 .  lidocaine (LIDODERM) 5 % 1 patch, 1 patch, Transdermal, Daily PRN, Lequita Halt, MD, 1 patch at 07/05/20 1327 .  lidocaine (LIDODERM) 5 % 1 patch, 1 patch, Transdermal, Q24H, Nicole Kindred A, DO, 1 patch at 07/11/20 1021 .  metoprolol tartrate (LOPRESSOR) tablet 12.5 mg, 12.5 mg, Oral, BID, O'Neal, Cassie Freer, MD, 12.5 mg at 07/11/20 1020 .  multivitamin with minerals tablet 1 tablet, 1 tablet, Oral, Daily, Nicole Kindred A, DO, 1 tablet at 07/11/20 1019 .  oxyCODONE (Oxy IR/ROXICODONE) immediate release tablet 5 mg, 5 mg, Oral, Q4H PRN,  Nicole Kindred A, DO, 5 mg at 07/10/20 2026 .  polyethylene glycol (MIRALAX / GLYCOLAX) packet 17 g, 17 g, Oral, Daily, Wynetta Fines T, MD, 17 g at 07/05/20 0929 .  saccharomyces boulardii (FLORASTOR) capsule 250 mg, 250 mg, Oral, BID, Lindell Spar E, NP, 250 mg at 07/11/20 1021 .  sodium chloride (OCEAN) 0.65 % nasal spray 1 spray, 1 spray, Each Nare, TID PRN, Wynetta Fines T, MD .  sucralfate (CARAFATE) 1 GM/10ML suspension 1 g, 1 g, Oral, BID PRN, Wynetta Fines T, MD .  traMADol Veatrice Bourbon) tablet 50 mg, 50 mg, Oral, BID PRN, Wynetta Fines T, MD, 50 mg at 07/08/20 1802  Patients Current Diet:  Diet Order  Diet Heart Room service appropriate? Yes; Fluid consistency: Thin  Diet effective now                 Precautions / Restrictions Precautions Precautions: Fall,Back Precaution Booklet Issued: Yes (comment) Precaution Comments: pt able to state 1/3 precautions but required assist to recall no bending or lifting Spinal Brace: Thoracolumbosacral orthotic Restrictions Weight Bearing Restrictions: No   Has the patient had 2 or more falls or a fall with injury in the past year? Yes  Prior Activity Level Limited Community (1-2x/wk): doctor's appointments only  Prior Functional Level Self Care: Did the patient need help bathing, dressing, using the toilet or eating? Independent  Indoor Mobility: Did the patient need assistance with walking from room to room (with or without device)? Independent  Stairs: Did the patient need assistance with internal or external stairs (with or without device)? Needed some help  Functional Cognition: Did the patient need help planning regular tasks such as shopping or remembering to take medications? Independent  Home Assistive Devices / Equipment Home Assistive Devices/Equipment: Environmental consultant (specify type) Home Equipment: Shower seat,Walker - 2 wheels,Walker - standard  Prior Device Use: Indicate devices/aids used by the patient prior to current  illness, exacerbation or injury? Walker  Current Functional Level Cognition  Overall Cognitive Status: Within Functional Limits for tasks assessed Current Attention Level: Selective Orientation Level: Oriented X4 Following Commands: Follows one step commands consistently,Follows one step commands with increased time,Follows multi-step commands with increased time General Comments: pt continues to be slightly anxious with mobility needing increased time to follow commands and complete mobility tasks    Extremity Assessment (includes Sensation/Coordination)  Upper Extremity Assessment: Defer to OT evaluation  Lower Extremity Assessment: RLE deficits/detail,LLE deficits/detail RLE Deficits / Details: ROM WFL, strength 4/4 RLE Sensation: decreased light touch (from ankles down) RLE Coordination: decreased fine motor LLE Deficits / Details: ROM WFL, strength 4/5 LLE Sensation: decreased light touch (from ankles down) LLE Coordination: decreased fine motor    ADLs  Overall ADL's : Needs assistance/impaired Eating/Feeding: Set up,Sitting Grooming: Set up,Sitting Upper Body Bathing: Minimal assistance,Sitting Lower Body Bathing: Moderate assistance,Sit to/from stand Upper Body Dressing : Maximal assistance,Sitting Upper Body Dressing Details (indicate cue type and reason): to odn brace from EOB Lower Body Dressing: Maximal assistance,Sit to/from stand Lower Body Dressing Details (indicate cue type and reason): difficulty in bringing feet to self L > R due to pain.unable to bend to feet due to precautions. educated on AE trial, repositioning techniques or husband assist Toilet Transfer: Min Animal nutritionist Details (indicate cue type and reason): simulated via functional mobility wtih Rw Toileting- Clothing Manipulation and Hygiene: Moderate assistance,Sit to/from stand Functional mobility during ADLs: Min guard,Rolling walker General ADL Comments: pt continues to present  with increased pain, back precaution and impaired balance    Mobility  Overal bed mobility: Needs Assistance Bed Mobility: Rolling,Sidelying to Sit Rolling: Supervision Sidelying to sit: Min guard Supine to sit: Min assist General bed mobility comments: pt with good carryover of log roll technique with pt needing only min guard for safety to elevate trunk into sitting    Transfers  Overall transfer level: Needs assistance Equipment used: Rolling walker (2 wheeled) Transfers: Sit to/from Stand Sit to Stand: Min guard Stand pivot transfers: Min assist General transfer comment: min guard to rise from EOB with cues for hand placement    Ambulation / Gait / Stairs / Wheelchair Mobility  Ambulation/Gait Ambulation/Gait assistance: Herbalist (Feet): 3 Feet Assistive device:  1 person hand held assist Gait Pattern/deviations: Step-through pattern,Decreased step length - right,Decreased step length - left,Trunk flexed,Shuffle General Gait Details: light min A for very short steps to recliner, decreased command follow with slowed lowering to chair Gait velocity: slowed    Posture / Balance Dynamic Sitting Balance Sitting balance - Comments: sitting EOB with no UE support and no LOB Balance Overall balance assessment: Needs assistance,History of Falls Sitting-balance support: Feet supported,No upper extremity supported Sitting balance-Leahy Scale: Good Sitting balance - Comments: sitting EOB with no UE support and no LOB Standing balance support: Bilateral upper extremity supported,During functional activity Standing balance-Leahy Scale: Poor Standing balance comment: reliant on UE support    Special needs/care consideration Skin ***, Diabetic management *** and Special service needs TSLO brace when OOB, IV antibiotics   Previous Home Environment (from acute therapy documentation) Living Arrangements: Spouse/significant other  Lives With: Spouse Available Help at  Discharge: Family,Available PRN/intermittently Type of Home: House Home Layout: Two level,Able to live on main level with bedroom/bathroom Home Access: Stairs to enter Entrance Stairs-Rails: None Entrance Stairs-Number of Steps: 2 Bathroom Shower/Tub: Ambulance person: Handicapped height Bathroom Accessibility: Yes How Accessible: Accessible via walker Home Care Services: No  Discharge Living Setting Plans for Discharge Living Setting: Patient's home Type of Home at Discharge: House Discharge Home Layout: Two level,Able to live on main level with bedroom/bathroom Discharge Home Access: Stairs to enter Entrance Stairs-Rails: None Entrance Stairs-Number of Steps: 2 Discharge Bathroom Shower/Tub: Walk-in shower,Tub/shower unit Discharge Bathroom Toilet: Handicapped height Discharge Bathroom Accessibility: Yes How Accessible: Accessible via walker Does the patient have any problems obtaining your medications?: No  Social/Family/Support Systems Patient Roles: Spouse Anticipated Caregiver: Nabilah Davoli, husband Anticipated Caregiver's Contact Information: [phone] Caregiver Availability: Intermittent (works half days, but can rearrange schedule per pt report) Discharge Plan Discussed with Primary Caregiver: Yes Is Caregiver In Agreement with Plan?: Yes Does Caregiver/Family have Issues with Lodging/Transportation while Pt is in Rehab?: No  Goals Patient/Family Goal for Rehab: PT/OT Mod I Expected length of stay: 5-7 days Pt/Family Agrees to Admission and willing to participate: Yes Program Orientation Provided & Reviewed with Pt/Caregiver Including Roles  & Responsibilities: Yes  Decrease burden of Care through IP rehab admission: Specialzed equipment needs, Decrease number of caregivers and Patient/family education   Possible need for SNF placement upon discharge: not anticipated  Patient Condition: I have reviewed medical records from [facility], spoken with CM, and patient. I met with patient at the bedside for inpatient rehabilitation assessment.  Patient will benefit from ongoing PT and OT, can actively participate in 3 hours of therapy a day 5 days of the week, and can make measurable gains during the admission.  Patient will also benefit from the coordinated team approach during an Inpatient Acute Rehabilitation admission.  The patient will receive intensive therapy as well as Rehabilitation physician, nursing, social worker, and care management interventions.  Due to safety, skin/wound care, disease management, medication administration, pain management and patient education the patient requires 24 hour a day rehabilitation nursing.  The patient is currently *** with mobility and basic ADLs.  Discharge setting and therapy post discharge at [facility] IP discharge location:[id number]} is anticipated.  Patient has agreed to participate in the Acute Inpatient Rehabilitation Program and will admit {Time; today/tomorrow:10263}.  Preadmission Screen Completed By:  Genella Mech, 07/11/2020 2:22 PM ______________________________________________________________________   Discussed status with Dr. Marland Kitchen on *** at *** and received approval for admission today.  Admission Coordinator:  Genella Mech, CCC-SLP, time ***Sudie Grumbling ***   Assessment/Plan: Diagnosis: 1. Does the need for close, 24 hr/day Medical supervision in concert with the patient's rehab needs make it unreasonable for this patient to be served in a less intensive setting? {yes_no_potentially:3041433} 2. Co-Morbidities requiring supervision/potential complications: *** 3. Due to {due XM:5800634}, does the patient require 24 hr/day rehab nursing? {yes_no_potentially:3041433} 4. Does the patient require coordinated care of a physician, rehab nurse, PT, OT, and SLP to address physical and functional deficits in the context of the above medical diagnosis(es)?  {yes_no_potentially:3041433} Addressing deficits in the following areas: {deficits:3041436} 5. Can the patient actively participate in an intensive therapy program of at least 3 hrs of therapy 5 days a week? {yes_no_potentially:3041433} 6. The potential for patient to make measurable gains while on inpatient rehab is {potential:3041437} 7. Anticipated functional outcomes upon discharge from inpatient rehab: {functional outcomes:304600100} PT, {functional outcomes:304600100} OT, {functional outcomes:304600100} SLP 8. Estimated rehab length of stay to reach the above functional goals is: *** 9. Anticipated discharge destination: {anticipated dc setting:21604} 10. Overall Rehab/Functional Prognosis: {potential:3041437}   MD Signature: ***

## 2020-07-11 NOTE — Progress Notes (Addendum)
ANTICOAGULATION CONSULT NOTE - Follow Up Consult  Pharmacy Consult for Apixaban Indication: Afib  Allergies  Allergen Reactions  . Actos [Pioglitazone] Swelling    Ankle swelling  . Sulfonamide Derivatives Swelling  . Adhesive [Tape] Other (See Comments)    WITH PROLONGED EXPOSURE (REDNESS/ITCHING)  . Codeine Nausea And Vomiting    Patient Measurements: Height: 5\' 4"  (162.6 cm) Weight: 43.4 kg (95 lb 10.9 oz) IBW/kg (Calculated) : 54.7  Vital Signs: Temp: 98.7 F (37.1 C) (03/16 0418) Temp Source: Oral (03/16 0418) BP: 135/46 (03/16 0418) Pulse Rate: 53 (03/16 0418)  Labs: Recent Labs    07/09/20 0054 07/10/20 0112 07/11/20 0059  HGB 9.8* 9.7* 8.7*  HCT 27.8* 28.6* 25.2*  PLT 156 167 143*  CREATININE 1.23*  --  1.18*  CKTOTAL  --   --  49    Estimated Creatinine Clearance: 23.9 mL/min (A) (by C-G formula based on SCr of 1.18 mg/dL (H)).    Assessment: 87 yo female presented on 07/04/2020 with fall, found to have progressive T12-L1 fractures and possible discitis. TTE found aortic valve vegitation and ceftriaxone and ampicillin were initiated. Patient on apixaban PTA for Afib which was held because of possible head bleed from fall, but was resumed given not surgical candidate per CVTS. Age >80 , weight <60kg, Scr <1.5, so dose of 2.5 BID appropriate for Afib.   3/16- hgb 8.7, platelets 143, dropped from 3/15  Goal of Therapy:  Therapeutic oral anticoagulation    Plan:  Continue apixaban 2.5mg  BID  Monitor hbg and platelets   Horton Finer  Pharm-D Candidate 07/11/2020,11:06 AM

## 2020-07-11 NOTE — TOC Progression Note (Signed)
Transition of Care [facility]) - Progression Note    Patient Details  Name: [name] MRN: [id number] Date of Birth: 1933/03/18  Transition of Care [facility]) CM/SW [place], LCSW Phone Number: 07/11/2020, 3:55 PM  Clinical Narrative:    CSW received request from [facility] to contact patient. CSW spoke with patint. She reported she received a call stating that she would have to re-apply for CIR when she was closer to being discharged. CSW unsure of what she means; CSW will reach out to [facility] to ask if they have received a denial.     Expected Discharge Plan: IP Rehab Facility Barriers to Discharge: Insurance Authorization,Continued Medical Work up  [employer] and Services Expected Discharge Plan: [place] In-house Referral: Clinical Social Work   Post Acute Care Choice: IP Rehab Living arrangements for the past 2 months: Single Family Home                                       Social Determinants of Health (SDOH) Interventions    Readmission Risk Interventions No flowsheet data found.

## 2020-07-11 NOTE — Care Management Important Message (Signed)
Important Message  Patient Details  Name: [name] MRN: [id number] Date of Birth: [birth year]   Medicare Important Message Given:  Yes - Important Message mailed due to current National Emergency  Verbal consent obtained due to current National Emergency  Relationship to patient: Self Contact Name: Shenice Dolder Call Date: 07/11/20  Time: 1408 Phone: [phone] Outcome: Spoke with contact Important Message mailed to: Patient address on file    [place] 07/11/2020, 2:08 PM

## 2020-07-11 NOTE — Progress Notes (Signed)
PROGRESS NOTE        PATIENT DETAILS Name: [name] Age: 87 y.o. Sex: female Date of Birth: 06/21/1932 Admit Date: 07/04/2020 Admitting Physician Lequita Halt, MD [id number], Alinda Sierras, MD  Brief Narrative: Patient is a 87 y.o. female chronic back pain, CAD s/p CABG, A. fib on Eliquis-presented following a fall-found to have T12-L1 fracture with possible discitis-further evaluation revealed Enterococcus bacteremia with aortic valve vegetation.  See below for further details.  Significant events: 3/9>> admit following fall-found to have L1 compression fracture  Significant studies: 3/9>> CT head: No acute abnormality 3/9>> CT abdomen/pelvis: L1 compression fracture 3/9>> MRI LS spine: T12-L1 fracture-retropulsion of bone into the canal without significant spinal stenosis 3/11>> Echo: EF 40-45%, RVSP 46.6, aortic valve vegetation 3/12>> CT maxillofacial: No evidence of acute maxillofacial abnormality.  Antimicrobial therapy: None  Microbiology data: 3/9>> urine culture: E. Coli 3/11>> blood culture: Enterococcus faecalis 3/12>> blood culture: Enterococcus faecalis 3/14>> blood culture: No growth  Procedures : None  Consults: Cardiology, ID, palliative care, cardiothoracic surgery, neurosurgery  DVT Prophylaxis : apixaban (ELIQUIS) tablet 2.5 mg Start: 07/08/20 1245 apixaban (ELIQUIS) tablet 2.5 mg    Subjective: Lying comfortably in bed-no major issues overnight.   Assessment/Plan: Enterococcus bacteremia with aortic valve vegetation-possible T12-L1 discitis: Continue ampicillin/Rocephin-blood cultures on 3/14 - so far.  Evaluated by cardiology/cardiothoracic surgery-and not a candidate for valve replacement.  Fall at home: Probably due to orthostatic mechanism-continue supportive care.  T12-L1 compression fractures/possible discitis: Continue supportive care-neurosurgery recommended conservative management.  AKI: Likely  hemodynamically mediated-improving with supportive care  Chronic atrial fibrillation: No longer on amiodarone-Per cardiology-to continue metoprolol and Eliquis.  Hypothyroidism: Continue Synthroid-repeat TSH in 4 to 6 weeks.  History of atrophic gastritis: Continue PPI/Carafate  Peripheral neuropathy: Continue Neurontin  Debility/physical deconditioning: Awaiting CIR evaluation  Palliative care/goals of care: DNR in place-palliative medicine following  Nutrition Problem: Nutrition Problem: Severe Malnutrition Etiology: chronic illness (chronic lower back pain 2/2 lumbar spine OA) Signs/Symptoms: energy intake < or equal to 75% for > or equal to 1 month,severe fat depletion,severe muscle depletion,percent weight loss Percent weight loss: 6 % Interventions: Ensure Enlive (each supplement provides 350kcal and 20 grams of protein),MVI,Magic cup   Diet: Diet Order            Diet Heart Room service appropriate? Yes; Fluid consistency: Thin  Diet effective now                  Code Status: DNR  Family Communication: We will update family over the next few days  Disposition Plan: Status is: Inpatient  Remains inpatient appropriate because:Inpatient level of care appropriate due to severity of illness   Dispo: The patient is from: Home              Anticipated d/c is to: CIR              Patient currently is not medically stable to d/c.   Difficult to place patient No    Barriers to Discharge: Aortic valve endocarditis-on IV antibiotics-awaiting final culture results before placing PICC line before consideration of discharge.  Antimicrobial agents: Anti-infectives (From admission, onward)   Start     Dose/Rate Route Frequency Ordered Stop   07/08/20 1000  cefTRIAXone (ROCEPHIN) 2 g in sodium chloride 0.9 % 100 mL IVPB  2 g 200 mL/hr over 30 Minutes Intravenous Every 12 hours 07/08/20 0744     07/08/20 0800  ampicillin (OMNIPEN) 2 g in sodium chloride 0.9 % 100  mL IVPB       Note to Pharmacy: Ampicillin 2 g IV q8h for CrCL < 30 mL/min   2 g 300 mL/hr over 20 Minutes Intravenous Every 8 hours 07/08/20 0744     07/07/20 0915  DAPTOmycin (CUBICIN) 365 mg in sodium chloride 0.9 % IVPB  Status:  Discontinued        8 mg/kg  45.6 kg 214.6 mL/hr over 30 Minutes Intravenous Every 48 hours 07/07/20 0824 07/08/20 0744   07/07/20 0900  cefTRIAXone (ROCEPHIN) 2 g in sodium chloride 0.9 % 100 mL IVPB  Status:  Discontinued        2 g 200 mL/hr over 30 Minutes Intravenous Daily 07/07/20 0812 07/08/20 0744   07/05/20 1030  cefTRIAXone (ROCEPHIN) 1 g in sodium chloride 0.9 % 100 mL IVPB  Status:  Discontinued        1 g 200 mL/hr over 30 Minutes Intravenous Every 24 hours 07/04/20 1326 07/06/20 1133   07/04/20 1030  cefTRIAXone (ROCEPHIN) 1 g in sodium chloride 0.9 % 100 mL IVPB        1 g 200 mL/hr over 30 Minutes Intravenous  Once 07/04/20 1025 07/04/20 1107       Time spent: 25- minutes-Greater than 50% of this time was spent in counseling, explanation of diagnosis, planning of further management, and coordination of care.  MEDICATIONS: Scheduled Meds: . acetaminophen  1,000 mg Oral Q8H  . apixaban  2.5 mg Oral BID  . Chlorhexidine Gluconate Cloth  6 each Topical Daily  . feeding supplement  1 Container Oral TID BM  . ipratropium  1 spray Each Nare Q12H  . lactose free nutrition  237 mL Oral TID WC  . levothyroxine  50 mcg Oral QHS  . lidocaine  1 patch Transdermal Q24H  . metoprolol tartrate  12.5 mg Oral BID  . multivitamin with minerals  1 tablet Oral Daily  . polyethylene glycol  17 g Oral Daily  . saccharomyces boulardii  250 mg Oral BID   Continuous Infusions: . ampicillin (OMNIPEN) IV 2 g (07/11/20 0126)  . cefTRIAXone (ROCEPHIN)  IV 2 g (07/10/20 2116)   PRN Meds:.HYDROmorphone (DILAUDID) injection, hyoscyamine, labetalol, lactase, lidocaine, oxyCODONE, sodium chloride, sucralfate, traMADol   PHYSICAL EXAM: Vital signs: Vitals:    07/10/20 0623 07/10/20 1434 07/10/20 1942 07/11/20 0418  BP: (!) 143/46 (!) 117/35  (!) 135/46  Pulse: (!) 56 (!) 50  (!) 53  Resp: 20   14  Temp: 98.2 F (36.8 C) 98 F (36.7 C) 98 F (36.7 C) 98.7 F (37.1 C)  TempSrc: Oral Oral Oral Oral  SpO2: 94% 97%  91%  Weight:      Height:       Filed Weights   07/04/20 0620 07/05/20 0200 07/09/20 0531  Weight: 40 kg 45.6 kg 43.4 kg   Body mass index is 16.42 kg/m.   Gen Exam:Alert awake-not in any distress HEENT:atraumatic, normocephalic Chest: B/L clear to auscultation anteriorly CVS:S1S2 regular Abdomen:soft non tender, non distended Extremities:no edema Neurology: Non focal Skin: no rash  I have personally reviewed following labs and imaging studies  LABORATORY DATA: CBC: Recent Labs  Lab 07/07/20 0358 07/08/20 0109 07/09/20 0054 07/10/20 0112 07/11/20 0059  WBC 9.9 9.9 12.0* 11.7* 9.9  NEUTROABS  --   --  10.2*  --  8.1*  HGB 11.0* 10.0* 9.8* 9.7* 8.7*  HCT 32.3* 30.0* 27.8* 28.6* 25.2*  MCV 82.4 84.0 82.0 82.9 81.6  PLT 153 156 156 167 143*    Basic Metabolic Panel: Recent Labs  Lab 07/06/20 0052 07/07/20 0358 07/08/20 0109 07/09/20 0054 07/10/20 0112 07/11/20 0059  NA 130* 129* 128* 126* 130* 128*  K 4.6 5.1 4.9 4.5  --  4.7  CL 105 103 104 103  --  103  CO2 19* 22 18* 18*  --  20*  GLUCOSE 180* 99 93 104*  --  97  BUN 41* 39* 37* 31*  --  26*  CREATININE 1.34* 1.31* 1.30* 1.23*  --  1.18*  CALCIUM 7.4* 7.9* 7.5* 7.6*  --  7.5*  MG  --  2.0 2.0 1.9  --  1.9  PHOS  --  2.8 3.3 3.2  --  3.1    GFR: Estimated Creatinine Clearance: 23.9 mL/min (A) (by C-G formula based on SCr of 1.18 mg/dL (H)).  Liver Function Tests: Recent Labs  Lab 07/06/20 0052 07/07/20 0358 07/08/20 0109 07/09/20 0054 07/11/20 0059  AST 260* 139* 96* 75* 47*  ALT 276* 208* 156* 138* 87*  ALKPHOS 78 81 77 81 77  BILITOT 0.7 0.9 0.5 0.6 0.7  PROT 4.8* 5.0* 4.6* 5.3* 4.7*  ALBUMIN 1.9* 2.0* 1.9* 2.1* 1.8*   No  results for input(s): LIPASE, AMYLASE in the last 168 hours. No results for input(s): AMMONIA in the last 168 hours.  Coagulation Profile: Recent Labs  Lab 07/07/20 0358 07/08/20 0109  INR 1.7* 1.5*    Cardiac Enzymes: Recent Labs  Lab 07/04/20 1657 07/11/20 0059  CKTOTAL 136 49    BNP (last 3 results) No results for input(s): PROBNP in the last 8760 hours.  Lipid Profile: No results for input(s): CHOL, HDL, LDLCALC, TRIG, CHOLHDL, LDLDIRECT in the last 72 hours.  Thyroid Function Tests: No results for input(s): TSH, T4TOTAL, FREET4, T3FREE, THYROIDAB in the last 72 hours.  Anemia Panel: No results for input(s): VITAMINB12, FOLATE, FERRITIN, TIBC, IRON, RETICCTPCT in the last 72 hours.  Urine analysis:    Component Value Date/Time   COLORURINE YELLOW 07/04/2020 0622   APPEARANCEUR HAZY (A) 07/04/2020 0622   LABSPEC 1.014 07/04/2020 0622   PHURINE 5.0 07/04/2020 0622   GLUCOSEU NEGATIVE 07/04/2020 0622   GLUCOSEU NEGATIVE 06/22/2013 1619   HGBUR SMALL (A) 07/04/2020 0622   HGBUR negative 07/30/2009 1606   BILIRUBINUR NEGATIVE 07/04/2020 0622   BILIRUBINUR negative 12/05/2019 1535   KETONESUR NEGATIVE 07/04/2020 0622   PROTEINUR NEGATIVE 07/04/2020 0622   UROBILINOGEN 0.2 12/05/2019 1535   UROBILINOGEN 0.2 06/22/2013 1619   NITRITE NEGATIVE 07/04/2020 0622   LEUKOCYTESUR SMALL (A) 07/04/2020 0622    Sepsis Labs: Lactic Acid, Venous No results found for: LATICACIDVEN  MICROBIOLOGY: Recent Results (from the past 240 hour(s))  Urine culture     Status: Abnormal   Collection Time: 07/04/20  8:09 AM   Specimen: Urine, Random  Result Value Ref Range Status   Specimen Description URINE, RANDOM  Final   Special Requests   Final    NONE Performed at [facility], [place] [address], [place], [place] [address]    Culture >=100,000 COLONIES/mL ESCHERICHIA COLI (A)  Final   Report Status 07/06/2020 FINAL  Final   Organism ID, Bacteria ESCHERICHIA COLI (A)   Final      Susceptibility   Escherichia coli - MIC*    AMPICILLIN  4 SENSITIVE Sensitive     CEFAZOLIN <=4 SENSITIVE Sensitive     CEFEPIME <=0.12 SENSITIVE Sensitive     CEFTRIAXONE <=0.25 SENSITIVE Sensitive     CIPROFLOXACIN <=0.25 SENSITIVE Sensitive     GENTAMICIN <=1 SENSITIVE Sensitive     IMIPENEM <=0.25 SENSITIVE Sensitive     NITROFURANTOIN <=16 SENSITIVE Sensitive     TRIMETH/SULFA >=320 RESISTANT Resistant     AMPICILLIN/SULBACTAM <=2 SENSITIVE Sensitive     PIP/TAZO <=4 SENSITIVE Sensitive     * >=100,000 COLONIES/mL ESCHERICHIA COLI  SARS CORONAVIRUS 2 (TAT 6-24 HRS) Nasopharyngeal Nasopharyngeal Swab     Status: None   Collection Time: 07/04/20 10:21 PM   Specimen: Nasopharyngeal Swab  Result Value Ref Range Status   SARS Coronavirus 2 NEGATIVE NEGATIVE Final    Comment: (NOTE) SARS-CoV-2 target nucleic acids are NOT DETECTED.  The SARS-CoV-2 RNA is generally detectable in upper and lower respiratory specimens during the acute phase of infection. Negative results do not preclude SARS-CoV-2 infection, do not rule out co-infections with other pathogens, and should not be used as the sole basis for treatment or other patient management decisions. Negative results must be combined with clinical observations, patient history, and epidemiological information. The expected result is Negative.  Fact Sheet for Patients: SugarRoll.be  Fact Sheet for Healthcare Providers: https://www.woods-mathews.com/  This test is not yet approved or cleared by the [place] FDA and  has been authorized for detection and/or diagnosis of SARS-CoV-2 by FDA under an Emergency Use Authorization (EUA). This EUA will remain  in effect (meaning this test can be used) for the duration of the COVID-19 declaration under Se ction 564(b)(1) of the Act, 21 U.S.C. section 360bbb-3(b)(1), unless the authorization is terminated or revoked  sooner.  Performed at [facility], [place] [address], [place], [place] [address]   Culture, blood (routine x 2)     Status: Abnormal   Collection Time: 07/06/20 10:04 PM   Specimen: BLOOD  Result Value Ref Range Status   Specimen Description BLOOD LEFT ARM  Final   Special Requests   Final    BOTTLES DRAWN AEROBIC AND ANAEROBIC Blood Culture adequate volume   Culture  Setup Time   Final    GRAM POSITIVE COCCI IN CHAINS IN BOTH AEROBIC AND ANAEROBIC BOTTLES CRITICAL RESULT CALLED TO, READ BACK BY AND VERIFIED WITH: G. ABBOTT,PHARMD 0000 07/08/2020 T. TYSOR Performed at [facility], [place] [address], [place],  [address]    Culture ENTEROCOCCUS FAECALIS (A)  Final   Report Status 07/09/2020 FINAL  Final   Organism ID, Bacteria ENTEROCOCCUS FAECALIS  Final      Susceptibility   Enterococcus faecalis - MIC*    AMPICILLIN <=2 SENSITIVE Sensitive     VANCOMYCIN 1 SENSITIVE Sensitive     GENTAMICIN SYNERGY SENSITIVE Sensitive     * ENTEROCOCCUS FAECALIS  Blood Culture ID Panel (Reflexed)     Status: Abnormal   Collection Time: 07/06/20 10:04 PM  Result Value Ref Range Status   Enterococcus faecalis DETECTED (A) NOT DETECTED Final    Comment: CRITICAL RESULT CALLED TO, READ BACK BY AND VERIFIED WITH: G. ABBOTT,PHARMD 0000 07/08/2020 T. TYSOR    Enterococcus Faecium NOT DETECTED NOT DETECTED Final   Listeria monocytogenes NOT DETECTED NOT DETECTED Final   Staphylococcus species NOT DETECTED NOT DETECTED Final   Staphylococcus aureus (BCID) NOT DETECTED NOT DETECTED Final   Staphylococcus epidermidis NOT DETECTED NOT DETECTED Final   Staphylococcus lugdunensis NOT DETECTED NOT  DETECTED Final   Streptococcus species NOT DETECTED NOT DETECTED Final   Streptococcus agalactiae NOT DETECTED NOT DETECTED Final   Streptococcus pneumoniae NOT DETECTED NOT DETECTED Final   Streptococcus pyogenes NOT DETECTED NOT DETECTED Final   A.calcoaceticus-baumannii NOT DETECTED NOT  DETECTED Final   Bacteroides fragilis NOT DETECTED NOT DETECTED Final   Enterobacterales NOT DETECTED NOT DETECTED Final   Enterobacter cloacae complex NOT DETECTED NOT DETECTED Final   Escherichia coli NOT DETECTED NOT DETECTED Final   Klebsiella aerogenes NOT DETECTED NOT DETECTED Final   Klebsiella oxytoca NOT DETECTED NOT DETECTED Final   Klebsiella pneumoniae NOT DETECTED NOT DETECTED Final   Proteus species NOT DETECTED NOT DETECTED Final   Salmonella species NOT DETECTED NOT DETECTED Final   Serratia marcescens NOT DETECTED NOT DETECTED Final   Haemophilus influenzae NOT DETECTED NOT DETECTED Final   Neisseria meningitidis NOT DETECTED NOT DETECTED Final   Pseudomonas aeruginosa NOT DETECTED NOT DETECTED Final   Stenotrophomonas maltophilia NOT DETECTED NOT DETECTED Final   Candida albicans NOT DETECTED NOT DETECTED Final   Candida auris NOT DETECTED NOT DETECTED Final   Candida glabrata NOT DETECTED NOT DETECTED Final   Candida krusei NOT DETECTED NOT DETECTED Final   Candida parapsilosis NOT DETECTED NOT DETECTED Final   Candida tropicalis NOT DETECTED NOT DETECTED Final   Cryptococcus neoformans/gattii NOT DETECTED NOT DETECTED Final   Vancomycin resistance NOT DETECTED NOT DETECTED Final    Comment: Performed at [facility], [address], [place], [place] [address]  Culture, blood (routine x 2)     Status: Abnormal   Collection Time: 07/06/20 10:12 PM   Specimen: BLOOD  Result Value Ref Range Status   Specimen Description BLOOD RIGHT ARM  Final   Special Requests   Final    BOTTLES DRAWN AEROBIC ONLY Blood Culture adequate volume   Culture  Setup Time   Final    GRAM POSITIVE COCCI AEROBIC BOTTLE ONLY CRITICAL VALUE NOTED.  VALUE IS CONSISTENT WITH PREVIOUSLY REPORTED AND CALLED VALUE.    Culture (A)  Final    ENTEROCOCCUS FAECALIS SUSCEPTIBILITIES PERFORMED ON PREVIOUS CULTURE WITHIN THE LAST 5 DAYS. Performed at [facility], [place] [address]  [place], [place] [address]    Report Status 07/09/2020 FINAL  Final  Culture, blood (routine x 2)     Status: Abnormal   Collection Time: 07/07/20  8:00 AM   Specimen: BLOOD  Result Value Ref Range Status   Specimen Description BLOOD LEFT ANTECUBITAL  Final   Special Requests   Final    BOTTLES DRAWN AEROBIC ONLY Blood Culture adequate volume   Culture  Setup Time   Final    GRAM POSITIVE COCCI IN CHAINS AEROBIC BOTTLE ONLY CRITICAL VALUE NOTED.  VALUE IS CONSISTENT WITH PREVIOUSLY REPORTED AND CALLED VALUE.    Culture (A)  Final    ENTEROCOCCUS FAECALIS SUSCEPTIBILITIES PERFORMED ON PREVIOUS CULTURE WITHIN THE LAST 5 DAYS. Performed at [facility], [place] [address] [place], [place] [address]    Report Status 07/09/2020 FINAL  Final  Culture, blood (routine x 2)     Status: Abnormal   Collection Time: 07/07/20  8:15 AM   Specimen: BLOOD  Result Value Ref Range Status   Specimen Description BLOOD RIGHT ANTECUBITAL  Final   Special Requests   Final    BOTTLES DRAWN AEROBIC ONLY Blood Culture adequate volume   Culture  Setup Time   Final    GRAM POSITIVE COCCI  IN CHAINS AEROBIC BOTTLE ONLY CRITICAL VALUE NOTED.  VALUE IS CONSISTENT WITH PREVIOUSLY REPORTED AND CALLED VALUE.    Culture (A)  Final    ENTEROCOCCUS FAECALIS SUSCEPTIBILITIES PERFORMED ON PREVIOUS CULTURE WITHIN THE LAST 5 DAYS. Performed at [facility], [place] [address] [place], [place] [address]    Report Status 07/09/2020 FINAL  Final  Culture, blood (routine x 2)     Status: None (Preliminary result)   Collection Time: 07/09/20  1:03 AM   Specimen: BLOOD  Result Value Ref Range Status   Specimen Description BLOOD RIGHT HAND  Final   Special Requests   Final    BOTTLES DRAWN AEROBIC AND ANAEROBIC Blood Culture adequate volume   Culture   Final    NO GROWTH 2 DAYS Performed at [facility], [place] [address] [place], [place] [address]    Report Status PENDING  Incomplete  Culture, blood (routine  x 2)     Status: None (Preliminary result)   Collection Time: 07/09/20  2:55 AM   Specimen: BLOOD  Result Value Ref Range Status   Specimen Description BLOOD RIGHT ARM  Final   Special Requests   Final    BOTTLES DRAWN AEROBIC AND ANAEROBIC Blood Culture adequate volume   Culture   Final    NO GROWTH 2 DAYS Performed at [facility], [place] [address], [place], [place] [address]    Report Status PENDING  Incomplete    RADIOLOGY STUDIES/RESULTS: No results found.   LOS: 7 days   Oren Binet, MD  [facility]    To contact the attending provider between 7A-7P or the covering provider during after hours 7P-7A, please log into the web site [email or web address] and access using universal [place] password for that web site. If you do not have the password, please call the hospital operator.  07/11/2020, 10:22 AM

## 2020-07-12 ENCOUNTER — Inpatient Hospital Stay: Payer: Self-pay

## 2020-07-12 DIAGNOSIS — M47812 Spondylosis without myelopathy or radiculopathy, cervical region: Secondary | ICD-10-CM | POA: Diagnosis not present

## 2020-07-12 DIAGNOSIS — N179 Acute kidney failure, unspecified: Secondary | ICD-10-CM | POA: Diagnosis not present

## 2020-07-12 DIAGNOSIS — I059 Rheumatic mitral valve disease, unspecified: Secondary | ICD-10-CM | POA: Diagnosis not present

## 2020-07-12 DIAGNOSIS — R7881 Bacteremia: Secondary | ICD-10-CM | POA: Diagnosis not present

## 2020-07-12 LAB — CBC
HCT: 27 % — ABNORMAL LOW (ref 36.0–46.0)
Hemoglobin: 9.4 g/dL — ABNORMAL LOW (ref 12.0–15.0)
MCH: 28.7 pg (ref 26.0–34.0)
MCHC: 34.8 g/dL (ref 30.0–36.0)
MCV: 82.3 fL (ref 80.0–100.0)
Platelets: 162 10*3/uL (ref 150–400)
RBC: 3.28 MIL/uL — ABNORMAL LOW (ref 3.87–5.11)
RDW: 15.9 % — ABNORMAL HIGH (ref 11.5–15.5)
WBC: 9.5 10*3/uL (ref 4.0–10.5)
nRBC: 0 % (ref 0.0–0.2)

## 2020-07-12 LAB — BASIC METABOLIC PANEL
Anion gap: 7 (ref 5–15)
BUN: 21 mg/dL (ref 8–23)
CO2: 21 mmol/L — ABNORMAL LOW (ref 22–32)
Calcium: 7.5 mg/dL — ABNORMAL LOW (ref 8.9–10.3)
Chloride: 103 mmol/L (ref 98–111)
Creatinine, Ser: 1.13 mg/dL — ABNORMAL HIGH (ref 0.44–1.00)
GFR, Estimated: 48 mL/min — ABNORMAL LOW (ref >60)
Glucose, Bld: 80 mg/dL (ref 70–99)
Potassium: 4.6 mmol/L (ref 3.5–5.1)
Sodium: 131 mmol/L — ABNORMAL LOW (ref 135–145)

## 2020-07-12 LAB — OSMOLALITY: Osmolality: 280 mOsm/kg (ref 275–295)

## 2020-07-12 LAB — SODIUM, URINE, RANDOM: Sodium, Ur: 17 mmol/L

## 2020-07-12 LAB — OSMOLALITY, URINE: Osmolality, Ur: 299 mOsm/kg — ABNORMAL LOW (ref 300–900)

## 2020-07-12 NOTE — TOC Progression Note (Signed)
Transition of Care [facility]) - Progression Note    Patient Details  Name: [name] MRN: [id number] Date of Birth: 05-04-1932  Transition of Care [facility]) CM/SW Contact  Milinda Antis, [place] Phone Number: 07/12/2020, 2:31 PM  Clinical Narrative:    CSW discussed SNF options with patient.  The patient now reports that she would prefer to receive rehab at [facility].     Expected Discharge Plan: IP Rehab Facility Barriers to Discharge: Insurance Authorization,Continued Medical Work up  Expected Discharge Plan and Services Expected Discharge Plan: [place] In-house Referral: Clinical Social Work   Post Acute Care Choice: IP Rehab Living arrangements for the past 2 months: Single Family Home                                       Social Determinants of Health (SDOH) Interventions    Readmission Risk Interventions No flowsheet data found.

## 2020-07-12 NOTE — Progress Notes (Addendum)
Nutrition Follow-up  DOCUMENTATION CODES:   Severe malnutrition in context of chronic illness  INTERVENTION:    Liberalize diet to Regular. Allow boiled egg per patient request.  D/C magic cup and Boost Breeze supplements, patient states she does not tolerate these supplements.   Continue Boost Plus PO TID, each supplement provides 360 kcal and 14 gm protein.  NUTRITION DIAGNOSIS:   Severe Malnutrition related to chronic illness (chronic lower back pain 2/2 lumbar spine OA) as evidenced by energy intake < or equal to 75% for > or equal to 1 month,severe fat depletion,severe muscle depletion,percent weight loss.  Ongoing  GOAL:   Patient will meet greater than or equal to 90% of their needs  Progressing  MONITOR:   PO intake,Supplement acceptance,Diet advancement  REASON FOR ASSESSMENT:   Malnutrition Screening Tool    ASSESSMENT:   87 yo female with PMH of chronic back pain 2/2 lumbar spine OA, chronic ambulation dysfunction, CAD s/p CABG, chronic A-fib, HLD, aortic and mitral regurgitation, HTN, IBS, and GERD who presents with a fall, AKI, and UTI.  Patient reports that she can only eat a small amount before feeling full. She had breakfast at ~8:00 this morning and she was still very full at 11:00. She had a Boost Plus supplement at bedside on ice. She was unable to drink it earlier, but plans to attempt to drink it with lunch today. She has tolerated grits, fresh fruit, tomato soup, and potato soup. She says the magic cups and Boost Breeze cause nausea. She requested boiled eggs. She does not like the taste of many of the foods she is receiving. Okay to liberalize diet per discussion with MD. Encouraged patient to eat small, frequent meals.   Labs reviewed. Na 131  Medications reviewed and include MVI with minerals, Florastor.   Diet Order:   Diet Order            Diet regular Room service appropriate? Yes; Fluid consistency: Thin  Diet effective now                  EDUCATION NEEDS:   Education needs have been addressed  Skin:  Skin Assessment: Skin Integrity Issues: Skin Integrity Issues:: Stage I Stage I: coccyx  Last BM:  3/17 type 5  Height:   Ht Readings from Last 1 Encounters:  07/05/20 5\' 4"  (1.626 m)    Weight:   Wt Readings from Last 1 Encounters:  07/09/20 43.4 kg    Ideal Body Weight:  54.5 kg  BMI:  Body mass index is 16.42 kg/m.  Estimated Nutritional Needs:   Kcal:  1500-1700  Protein:  80-95 grams  Fluid:  >1.5 L    Lucas Mallow, RD, LDN, CNSC Please refer to Amion for contact information.

## 2020-07-12 NOTE — Progress Notes (Signed)
PROGRESS NOTE        PATIENT DETAILS Name: [name] Age: 87 y.o. Sex: female Date of Birth: Oct 19, 1932 Admit Date: 07/04/2020 Admitting Physician Lequita Halt, MD YIF:OYDXAJOIN, Alinda Sierras, MD  Brief Narrative: Patient is a 87 y.o. female chronic back pain, CAD s/p CABG, A. fib on Eliquis-presented following a fall-found to have T12-L1 fracture with possible discitis-further evaluation revealed Enterococcus bacteremia with aortic valve vegetation.  See below for further details.  Significant events: 3/9>> admit following fall-found to have L1 compression fracture  Significant studies: 3/9>> CT head: No acute abnormality 3/9>> CT abdomen/pelvis: L1 compression fracture 3/9>> MRI LS spine: T12-L1 fracture-retropulsion of bone into the canal without significant spinal stenosis 3/11>> Echo: EF 40-45%, RVSP 46.6, aortic valve vegetation 3/12>> CT maxillofacial: No evidence of acute maxillofacial abnormality.  Antimicrobial therapy: None  Microbiology data: 3/9>> urine culture: E. Coli 3/11>> blood culture: Enterococcus faecalis 3/12>> blood culture: Enterococcus faecalis 3/14>> blood culture: No growth  Procedures : None  Consults: Cardiology, ID, palliative care, cardiothoracic surgery, neurosurgery  DVT Prophylaxis : apixaban (ELIQUIS) tablet 2.5 mg Start: 07/08/20 1245 apixaban (ELIQUIS) tablet 2.5 mg    Subjective: Feels better-pain in the back is well controlled with oral narcotics.   Assessment/Plan: Enterococcus bacteremia with aortic valve vegetation-possible T12-L1 discitis: Continue ampicillin/Rocephin-blood cultures on 3/14 neg so far.  Evaluated by cardiology/cardiothoracic surgery-and not a candidate for valve replacement.  Will discuss with ID-to see if PICC line can be placed today.  Fall at home: Probably due to orthostatic mechanism-continue supportive care.  T12-L1 compression fractures/possible discitis: Continue  supportive care-neurosurgery recommended conservative management.  Hyponatremia: Mild-improving-suspect SIADH physiology given she is euvolemic.  Maintain fluid restriction.  AKI: Likely hemodynamically mediated-improving with supportive care  Chronic atrial fibrillation: No longer on amiodarone-Per cardiology-to continue metoprolol and Eliquis.  Hypothyroidism: Continue Synthroid-repeat TSH in 4 to 6 weeks.  History of atrophic gastritis: Continue PPI/Carafate  Peripheral neuropathy: Continue Neurontin  Debility/physical deconditioning: Initially thought to be a CR candidate-however insurance has denied CIR-plans are now for SNF.  Palliative care/goals of care: DNR in place-palliative medicine following  Nutrition Problem: Nutrition Problem: Severe Malnutrition Etiology: chronic illness (chronic lower back pain 2/2 lumbar spine OA) Signs/Symptoms: energy intake < or equal to 75% for > or equal to 1 month,severe fat depletion,severe muscle depletion,percent weight loss Percent weight loss: 6 % Interventions: Ensure Enlive (each supplement provides 350kcal and 20 grams of protein),MVI,Magic cup   Diet: Diet Order            Diet Heart Room service appropriate? Yes; Fluid consistency: Thin  Diet effective now                  Code Status: DNR  Family Communication: Spouse-David Lenzo-[phone]-left a voicemail on 3/17.  Disposition Plan: Status is: Inpatient  Remains inpatient appropriate because:Inpatient level of care appropriate due to severity of illness   Dispo: The patient is from: Home              Anticipated d/c is to: SNF              Patient currently is not medically stable to d/c.   Difficult to place patient No    Barriers to Discharge: Aortic valve endocarditis-on IV antibiotics-awaiting final culture results before placing PICC line before consideration of discharge.  Antimicrobial agents: Anti-infectives (  From admission, onward)   Start      Dose/Rate Route Frequency Ordered Stop   07/08/20 1000  cefTRIAXone (ROCEPHIN) 2 g in sodium chloride 0.9 % 100 mL IVPB        2 g 200 mL/hr over 30 Minutes Intravenous Every 12 hours 07/08/20 0744     07/08/20 0800  ampicillin (OMNIPEN) 2 g in sodium chloride 0.9 % 100 mL IVPB       Note to Pharmacy: Ampicillin 2 g IV q8h for CrCL < 30 mL/min   2 g 300 mL/hr over 20 Minutes Intravenous Every 8 hours 07/08/20 0744     07/07/20 0915  DAPTOmycin (CUBICIN) 365 mg in sodium chloride 0.9 % IVPB  Status:  Discontinued        8 mg/kg  45.6 kg 214.6 mL/hr over 30 Minutes Intravenous Every 48 hours 07/07/20 0824 07/08/20 0744   07/07/20 0900  cefTRIAXone (ROCEPHIN) 2 g in sodium chloride 0.9 % 100 mL IVPB  Status:  Discontinued        2 g 200 mL/hr over 30 Minutes Intravenous Daily 07/07/20 0812 07/08/20 0744   07/05/20 1030  cefTRIAXone (ROCEPHIN) 1 g in sodium chloride 0.9 % 100 mL IVPB  Status:  Discontinued        1 g 200 mL/hr over 30 Minutes Intravenous Every 24 hours 07/04/20 1326 07/06/20 1133   07/04/20 1030  cefTRIAXone (ROCEPHIN) 1 g in sodium chloride 0.9 % 100 mL IVPB        1 g 200 mL/hr over 30 Minutes Intravenous  Once 07/04/20 1025 07/04/20 1107       Time spent: 25- minutes-Greater than 50% of this time was spent in counseling, explanation of diagnosis, planning of further management, and coordination of care.  MEDICATIONS: Scheduled Meds: . acetaminophen  1,000 mg Oral Q8H  . apixaban  2.5 mg Oral BID  . feeding supplement  1 Container Oral TID BM  . ipratropium  1 spray Each Nare Q12H  . lactose free nutrition  237 mL Oral TID WC  . levothyroxine  50 mcg Oral QHS  . lidocaine  1 patch Transdermal Q24H  . metoprolol tartrate  12.5 mg Oral BID  . multivitamin with minerals  1 tablet Oral Daily  . polyethylene glycol  17 g Oral Daily  . saccharomyces boulardii  250 mg Oral BID   Continuous Infusions: . ampicillin (OMNIPEN) IV Stopped (07/12/20 0828)  .  cefTRIAXone (ROCEPHIN)  IV Stopped (07/12/20 0910)   PRN Meds:.HYDROmorphone (DILAUDID) injection, hyoscyamine, labetalol, lactase, lidocaine, oxyCODONE, sodium chloride, sucralfate, traMADol   PHYSICAL EXAM: Vital signs: Vitals:   07/11/20 1508 07/11/20 2125 07/12/20 0432 07/12/20 0751  BP:  (!) 118/43 (!) 120/53   Pulse:  (!) 55 (!) 52   Resp: 18 18 16    Temp:  (!) 97.5 F (36.4 C) 98 F (36.7 C)   TempSrc:  Oral Oral   SpO2:  93% 91% 92%  Weight:      Height:       Filed Weights   07/04/20 0620 07/05/20 0200 07/09/20 0531  Weight: 40 kg 45.6 kg 43.4 kg   Body mass index is 16.42 kg/m.   Gen Exam:Alert awake-not in any distress HEENT:atraumatic, normocephalic Chest: B/L clear to auscultation anteriorly CVS:S1S2 regular Abdomen:soft non tender, non distended Extremities:no edema Neurology: Non focal Skin: no rash  I have personally reviewed following labs and imaging studies  LABORATORY DATA: CBC: Recent Labs  Lab 07/08/20 0109 07/09/20 0054 07/10/20  0112 07/11/20 0059 07/12/20 0120  WBC 9.9 12.0* 11.7* 9.9 9.5  NEUTROABS  --  10.2*  --  8.1*  --   HGB 10.0* 9.8* 9.7* 8.7* 9.4*  HCT 30.0* 27.8* 28.6* 25.2* 27.0*  MCV 84.0 82.0 82.9 81.6 82.3  PLT 156 156 167 143* 433    Basic Metabolic Panel: Recent Labs  Lab 07/07/20 0358 07/08/20 0109 07/09/20 0054 07/10/20 0112 07/11/20 0059 07/12/20 0715  NA 129* 128* 126* 130* 128* 131*  K 5.1 4.9 4.5  --  4.7 4.6  CL 103 104 103  --  103 103  CO2 22 18* 18*  --  20* 21*  GLUCOSE 99 93 104*  --  97 80  BUN 39* 37* 31*  --  26* 21  CREATININE 1.31* 1.30* 1.23*  --  1.18* 1.13*  CALCIUM 7.9* 7.5* 7.6*  --  7.5* 7.5*  MG 2.0 2.0 1.9  --  1.9  --   PHOS 2.8 3.3 3.2  --  3.1  --     GFR: Estimated Creatinine Clearance: 24.9 mL/min (A) (by C-G formula based on SCr of 1.13 mg/dL (H)).  Liver Function Tests: Recent Labs  Lab 07/06/20 0052 07/07/20 0358 07/08/20 0109 07/09/20 0054 07/11/20 0059  AST  260* 139* 96* 75* 47*  ALT 276* 208* 156* 138* 87*  ALKPHOS 78 81 77 81 77  BILITOT 0.7 0.9 0.5 0.6 0.7  PROT 4.8* 5.0* 4.6* 5.3* 4.7*  ALBUMIN 1.9* 2.0* 1.9* 2.1* 1.8*   No results for input(s): LIPASE, AMYLASE in the last 168 hours. No results for input(s): AMMONIA in the last 168 hours.  Coagulation Profile: Recent Labs  Lab 07/07/20 0358 07/08/20 0109  INR 1.7* 1.5*    Cardiac Enzymes: Recent Labs  Lab 07/11/20 0059  CKTOTAL 49    BNP (last 3 results) No results for input(s): PROBNP in the last 8760 hours.  Lipid Profile: No results for input(s): CHOL, HDL, LDLCALC, TRIG, CHOLHDL, LDLDIRECT in the last 72 hours.  Thyroid Function Tests: No results for input(s): TSH, T4TOTAL, FREET4, T3FREE, THYROIDAB in the last 72 hours.  Anemia Panel: No results for input(s): VITAMINB12, FOLATE, FERRITIN, TIBC, IRON, RETICCTPCT in the last 72 hours.  Urine analysis:    Component Value Date/Time   COLORURINE YELLOW 07/04/2020 0622   APPEARANCEUR HAZY (A) 07/04/2020 0622   LABSPEC 1.014 07/04/2020 0622   PHURINE 5.0 07/04/2020 0622   GLUCOSEU NEGATIVE 07/04/2020 0622   GLUCOSEU NEGATIVE 06/22/2013 1619   HGBUR SMALL (A) 07/04/2020 0622   HGBUR negative 07/30/2009 1606   BILIRUBINUR NEGATIVE 07/04/2020 0622   BILIRUBINUR negative 12/05/2019 1535   KETONESUR NEGATIVE 07/04/2020 0622   PROTEINUR NEGATIVE 07/04/2020 0622   UROBILINOGEN 0.2 12/05/2019 1535   UROBILINOGEN 0.2 06/22/2013 1619   NITRITE NEGATIVE 07/04/2020 0622   LEUKOCYTESUR SMALL (A) 07/04/2020 0622    Sepsis Labs: Lactic Acid, Venous No results found for: LATICACIDVEN  MICROBIOLOGY: Recent Results (from the past 240 hour(s))  Urine culture     Status: Abnormal   Collection Time: 07/04/20  8:09 AM   Specimen: Urine, Random  Result Value Ref Range Status   Specimen Description URINE, RANDOM  Final   Special Requests   Final    NONE Performed at [facility], [place] [address] [place],  [place] [address]    Culture >=100,000 COLONIES/mL ESCHERICHIA COLI (A)  Final   Report Status 07/06/2020 FINAL  Final   Organism ID, Bacteria ESCHERICHIA COLI (A)  Final      Susceptibility   Escherichia coli - MIC*    AMPICILLIN 4 SENSITIVE Sensitive     CEFAZOLIN <=4 SENSITIVE Sensitive     CEFEPIME <=0.12 SENSITIVE Sensitive     CEFTRIAXONE <=0.25 SENSITIVE Sensitive     CIPROFLOXACIN <=0.25 SENSITIVE Sensitive     GENTAMICIN <=1 SENSITIVE Sensitive     IMIPENEM <=0.25 SENSITIVE Sensitive     NITROFURANTOIN <=16 SENSITIVE Sensitive     TRIMETH/SULFA >=320 RESISTANT Resistant     AMPICILLIN/SULBACTAM <=2 SENSITIVE Sensitive     PIP/TAZO <=4 SENSITIVE Sensitive     * >=100,000 COLONIES/mL ESCHERICHIA COLI  SARS CORONAVIRUS 2 (TAT 6-24 HRS) Nasopharyngeal Nasopharyngeal Swab     Status: None   Collection Time: 07/04/20 10:21 PM   Specimen: Nasopharyngeal Swab  Result Value Ref Range Status   SARS Coronavirus 2 NEGATIVE NEGATIVE Final    Comment: (NOTE) SARS-CoV-2 target nucleic acids are NOT DETECTED.  The SARS-CoV-2 RNA is generally detectable in upper and lower respiratory specimens during the acute phase of infection. Negative results do not preclude SARS-CoV-2 infection, do not rule out co-infections with other pathogens, and should not be used as the sole basis for treatment or other patient management decisions. Negative results must be combined with clinical observations, patient history, and epidemiological information. The expected result is Negative.  Fact Sheet for Patients: SugarRoll.be  Fact Sheet for Healthcare Providers: https://www.woods-mathews.com/  This test is not yet approved or cleared by the [place] FDA and  has been authorized for detection and/or diagnosis of SARS-CoV-2 by FDA under an Emergency Use Authorization (EUA). This EUA will remain  in effect (meaning this test can be used) for the duration of  the COVID-19 declaration under Se ction 564(b)(1) of the Act, 21 U.S.C. section 360bbb-3(b)(1), unless the authorization is terminated or revoked sooner.  Performed at [facility], [place] [address], [place], [place] [address]   Culture, blood (routine x 2)     Status: Abnormal   Collection Time: 07/06/20 10:04 PM   Specimen: BLOOD  Result Value Ref Range Status   Specimen Description BLOOD LEFT ARM  Final   Special Requests   Final    BOTTLES DRAWN AEROBIC AND ANAEROBIC Blood Culture adequate volume   Culture  Setup Time   Final    GRAM POSITIVE COCCI IN CHAINS IN BOTH AEROBIC AND ANAEROBIC BOTTLES CRITICAL RESULT CALLED TO, READ BACK BY AND VERIFIED WITH: G. ABBOTT,PHARMD 0000 07/08/2020 T. TYSOR Performed at [facility], [place] [address], [place], [place] [address]    Culture ENTEROCOCCUS FAECALIS (A)  Final   Report Status 07/09/2020 FINAL  Final   Organism ID, Bacteria ENTEROCOCCUS FAECALIS  Final      Susceptibility   Enterococcus faecalis - MIC*    AMPICILLIN <=2 SENSITIVE Sensitive     VANCOMYCIN 1 SENSITIVE Sensitive     GENTAMICIN SYNERGY SENSITIVE Sensitive     * ENTEROCOCCUS FAECALIS  Blood Culture ID Panel (Reflexed)     Status: Abnormal   Collection Time: 07/06/20 10:04 PM  Result Value Ref Range Status   Enterococcus faecalis DETECTED (A) NOT DETECTED Final    Comment: CRITICAL RESULT CALLED TO, READ BACK BY AND VERIFIED WITH: G. ABBOTT,PHARMD 0000 07/08/2020 T. TYSOR    Enterococcus Faecium NOT DETECTED NOT DETECTED Final   Listeria monocytogenes NOT DETECTED NOT DETECTED Final   Staphylococcus species NOT DETECTED NOT DETECTED Final   Staphylococcus aureus (BCID) NOT DETECTED NOT DETECTED  Final   Staphylococcus epidermidis NOT DETECTED NOT DETECTED Final   Staphylococcus lugdunensis NOT DETECTED NOT DETECTED Final   Streptococcus species NOT DETECTED NOT DETECTED Final   Streptococcus agalactiae NOT DETECTED NOT DETECTED Final   Streptococcus  pneumoniae NOT DETECTED NOT DETECTED Final   Streptococcus pyogenes NOT DETECTED NOT DETECTED Final   A.calcoaceticus-baumannii NOT DETECTED NOT DETECTED Final   Bacteroides fragilis NOT DETECTED NOT DETECTED Final   Enterobacterales NOT DETECTED NOT DETECTED Final   Enterobacter cloacae complex NOT DETECTED NOT DETECTED Final   Escherichia coli NOT DETECTED NOT DETECTED Final   Klebsiella aerogenes NOT DETECTED NOT DETECTED Final   Klebsiella oxytoca NOT DETECTED NOT DETECTED Final   Klebsiella pneumoniae NOT DETECTED NOT DETECTED Final   Proteus species NOT DETECTED NOT DETECTED Final   Salmonella species NOT DETECTED NOT DETECTED Final   Serratia marcescens NOT DETECTED NOT DETECTED Final   Haemophilus influenzae NOT DETECTED NOT DETECTED Final   Neisseria meningitidis NOT DETECTED NOT DETECTED Final   Pseudomonas aeruginosa NOT DETECTED NOT DETECTED Final   Stenotrophomonas maltophilia NOT DETECTED NOT DETECTED Final   Candida albicans NOT DETECTED NOT DETECTED Final   Candida auris NOT DETECTED NOT DETECTED Final   Candida glabrata NOT DETECTED NOT DETECTED Final   Candida krusei NOT DETECTED NOT DETECTED Final   Candida parapsilosis NOT DETECTED NOT DETECTED Final   Candida tropicalis NOT DETECTED NOT DETECTED Final   Cryptococcus neoformans/gattii NOT DETECTED NOT DETECTED Final   Vancomycin resistance NOT DETECTED NOT DETECTED Final    Comment: Performed at [facility], [address], [place], [place] [address]  Culture, blood (routine x 2)     Status: Abnormal   Collection Time: 07/06/20 10:12 PM   Specimen: BLOOD  Result Value Ref Range Status   Specimen Description BLOOD RIGHT ARM  Final   Special Requests   Final    BOTTLES DRAWN AEROBIC ONLY Blood Culture adequate volume   Culture  Setup Time   Final    GRAM POSITIVE COCCI AEROBIC BOTTLE ONLY CRITICAL VALUE NOTED.  VALUE IS CONSISTENT WITH PREVIOUSLY REPORTED AND CALLED VALUE.    Culture (A)  Final     ENTEROCOCCUS FAECALIS SUSCEPTIBILITIES PERFORMED ON PREVIOUS CULTURE WITHIN THE LAST 5 DAYS. Performed at [facility], [place] [address] [place], [place] [address]    Report Status 07/09/2020 FINAL  Final  Culture, blood (routine x 2)     Status: Abnormal   Collection Time: 07/07/20  8:00 AM   Specimen: BLOOD  Result Value Ref Range Status   Specimen Description BLOOD LEFT ANTECUBITAL  Final   Special Requests   Final    BOTTLES DRAWN AEROBIC ONLY Blood Culture adequate volume   Culture  Setup Time   Final    GRAM POSITIVE COCCI IN CHAINS AEROBIC BOTTLE ONLY CRITICAL VALUE NOTED.  VALUE IS CONSISTENT WITH PREVIOUSLY REPORTED AND CALLED VALUE.    Culture (A)  Final    ENTEROCOCCUS FAECALIS SUSCEPTIBILITIES PERFORMED ON PREVIOUS CULTURE WITHIN THE LAST 5 DAYS. Performed at [facility], [place] [address] [place], [place] [address]    Report Status 07/09/2020 FINAL  Final  Culture, blood (routine x 2)     Status: Abnormal   Collection Time: 07/07/20  8:15 AM   Specimen: BLOOD  Result Value Ref Range Status   Specimen Description BLOOD RIGHT ANTECUBITAL  Final   Special Requests   Final    BOTTLES DRAWN AEROBIC ONLY Blood Culture  adequate volume   Culture  Setup Time   Final    GRAM POSITIVE COCCI IN CHAINS AEROBIC BOTTLE ONLY CRITICAL VALUE NOTED.  VALUE IS CONSISTENT WITH PREVIOUSLY REPORTED AND CALLED VALUE.    Culture (A)  Final    ENTEROCOCCUS FAECALIS SUSCEPTIBILITIES PERFORMED ON PREVIOUS CULTURE WITHIN THE LAST 5 DAYS. Performed at [facility], [place] [address], [place], [place] [address]    Report Status 07/09/2020 FINAL  Final  Culture, blood (routine x 2)     Status: None (Preliminary result)   Collection Time: 07/09/20  1:03 AM   Specimen: BLOOD  Result Value Ref Range Status   Specimen Description BLOOD RIGHT HAND  Final   Special Requests   Final    BOTTLES DRAWN AEROBIC AND ANAEROBIC Blood Culture adequate volume   Culture   Final    NO GROWTH  3 DAYS Performed at [facility], [place] [address], [place], [place] [address]    Report Status PENDING  Incomplete  Culture, blood (routine x 2)     Status: None (Preliminary result)   Collection Time: 07/09/20  2:55 AM   Specimen: BLOOD  Result Value Ref Range Status   Specimen Description BLOOD RIGHT ARM  Final   Special Requests   Final    BOTTLES DRAWN AEROBIC AND ANAEROBIC Blood Culture adequate volume   Culture   Final    NO GROWTH 3 DAYS Performed at [facility], [address], [place], [place] [address]    Report Status PENDING  Incomplete    RADIOLOGY STUDIES/RESULTS: No results found.   LOS: 8 days   Oren Binet, MD  [facility]    To contact the attending provider between 7A-7P or the covering provider during after hours 7P-7A, please log into the web site [email or web address] and access using universal [place] password for that web site. If you do not have the password, please call the hospital operator.  07/12/2020, 11:08 AM

## 2020-07-12 NOTE — Progress Notes (Signed)
Subjective: Pt frustrated with insurance and would vastly prefer to go to inpatient rehab  Antibiotics:  Anti-infectives (From admission, onward)   Start     Dose/Rate Route Frequency Ordered Stop   07/08/20 1000  cefTRIAXone (ROCEPHIN) 2 g in sodium chloride 0.9 % 100 mL IVPB        2 g 200 mL/hr over 30 Minutes Intravenous Every 12 hours 07/08/20 0744     07/08/20 0800  ampicillin (OMNIPEN) 2 g in sodium chloride 0.9 % 100 mL IVPB       Note to Pharmacy: Ampicillin 2 g IV q8h for CrCL < 30 mL/min   2 g 300 mL/hr over 20 Minutes Intravenous Every 8 hours 07/08/20 0744     07/07/20 0915  DAPTOmycin (CUBICIN) 365 mg in sodium chloride 0.9 % IVPB  Status:  Discontinued        8 mg/kg  45.6 kg 214.6 mL/hr over 30 Minutes Intravenous Every 48 hours 07/07/20 0824 07/08/20 0744   07/07/20 0900  cefTRIAXone (ROCEPHIN) 2 g in sodium chloride 0.9 % 100 mL IVPB  Status:  Discontinued        2 g 200 mL/hr over 30 Minutes Intravenous Daily 07/07/20 0812 07/08/20 0744   07/05/20 1030  cefTRIAXone (ROCEPHIN) 1 g in sodium chloride 0.9 % 100 mL IVPB  Status:  Discontinued        1 g 200 mL/hr over 30 Minutes Intravenous Every 24 hours 07/04/20 1326 07/06/20 1133   07/04/20 1030  cefTRIAXone (ROCEPHIN) 1 g in sodium chloride 0.9 % 100 mL IVPB        1 g 200 mL/hr over 30 Minutes Intravenous  Once 07/04/20 1025 07/04/20 1107      Medications: Scheduled Meds: . acetaminophen  1,000 mg Oral Q8H  . apixaban  2.5 mg Oral BID  . ipratropium  1 spray Each Nare Q12H  . lactose free nutrition  237 mL Oral TID WC  . levothyroxine  50 mcg Oral QHS  . lidocaine  1 patch Transdermal Q24H  . metoprolol tartrate  12.5 mg Oral BID  . multivitamin with minerals  1 tablet Oral Daily  . polyethylene glycol  17 g Oral Daily  . saccharomyces boulardii  250 mg Oral BID   Continuous Infusions: . ampicillin (OMNIPEN) IV 2 g (07/12/20 1821)  . cefTRIAXone (ROCEPHIN)  IV Stopped (07/12/20 0910)    PRN Meds:.HYDROmorphone (DILAUDID) injection, hyoscyamine, labetalol, lactase, lidocaine, oxyCODONE, sodium chloride, sucralfate, traMADol    Objective: Weight change:   Intake/Output Summary (Last 24 hours) at 07/12/2020 1939 Last data filed at 07/12/2020 0945 Gross per 24 hour  Intake 300 ml  Output 650 ml  Net -350 ml   Blood pressure (!) 120/53, pulse (!) 52, temperature 98 F (36.7 C), temperature source Oral, resp. rate 16, height 5\' 4"  (1.626 m), weight 43.4 kg, SpO2 92 %. Temp:  [97.5 F (36.4 C)-98 F (36.7 C)] 98 F (36.7 C) (03/17 0432) Pulse Rate:  [52-55] 52 (03/17 0432) Resp:  [16-18] 16 (03/17 0432) BP: (118-120)/(43-53) 120/53 (03/17 0432) SpO2:  [91 %-93 %] 92 % (03/17 0751)  Physical Exam: Physical Exam Constitutional:      Appearance: She is underweight.  Cardiovascular:     Rate and Rhythm: Tachycardia present.     Heart sounds: No murmur heard. No friction rub.  Pulmonary:     Effort: Pulmonary effort is normal. No respiratory distress.     Breath sounds: Normal  breath sounds. No stridor. No wheezing or rhonchi.  Abdominal:     General: Bowel sounds are normal. There is no distension.     Palpations: There is no mass.  Neurological:     General: No focal deficit present.     Mental Status: She is alert and oriented to person, place, and time.  Psychiatric:        Attention and Perception: Attention normal.        Mood and Affect: Mood is anxious and depressed.        Speech: Speech normal.        Behavior: Behavior normal.        Thought Content: Thought content normal.        Cognition and Memory: Cognition normal.        Judgment: Judgment normal.      CBC:    BMET Recent Labs    07/11/20 0059 07/12/20 0715  NA 128* 131*  K 4.7 4.6  CL 103 103  CO2 20* 21*  GLUCOSE 97 80  BUN 26* 21  CREATININE 1.18* 1.13*  CALCIUM 7.5* 7.5*     Liver Panel  Recent Labs    07/11/20 0059  PROT 4.7*  ALBUMIN 1.8*  AST 47*  ALT 87*   ALKPHOS 77  BILITOT 0.7       Sedimentation Rate No results for input(s): ESRSEDRATE in the last 72 hours. C-Reactive Protein No results for input(s): CRP in the last 72 hours.  Micro Results: Recent Results (from the past 720 hour(s))  Urine culture     Status: Abnormal   Collection Time: 07/04/20  8:09 AM   Specimen: Urine, Random  Result Value Ref Range Status   Specimen Description URINE, RANDOM  Final   Special Requests   Final    NONE Performed at [facility], [address], [place], [place] [address]    Culture >=100,000 COLONIES/mL ESCHERICHIA COLI (A)  Final   Report Status 07/06/2020 FINAL  Final   Organism ID, Bacteria ESCHERICHIA COLI (A)  Final      Susceptibility   Escherichia coli - MIC*    AMPICILLIN 4 SENSITIVE Sensitive     CEFAZOLIN <=4 SENSITIVE Sensitive     CEFEPIME <=0.12 SENSITIVE Sensitive     CEFTRIAXONE <=0.25 SENSITIVE Sensitive     CIPROFLOXACIN <=0.25 SENSITIVE Sensitive     GENTAMICIN <=1 SENSITIVE Sensitive     IMIPENEM <=0.25 SENSITIVE Sensitive     NITROFURANTOIN <=16 SENSITIVE Sensitive     TRIMETH/SULFA >=320 RESISTANT Resistant     AMPICILLIN/SULBACTAM <=2 SENSITIVE Sensitive     PIP/TAZO <=4 SENSITIVE Sensitive     * >=100,000 COLONIES/mL ESCHERICHIA COLI  SARS CORONAVIRUS 2 (TAT 6-24 HRS) Nasopharyngeal Nasopharyngeal Swab     Status: None   Collection Time: 07/04/20 10:21 PM   Specimen: Nasopharyngeal Swab  Result Value Ref Range Status   SARS Coronavirus 2 NEGATIVE NEGATIVE Final    Comment: (NOTE) SARS-CoV-2 target nucleic acids are NOT DETECTED.  The SARS-CoV-2 RNA is generally detectable in upper and lower respiratory specimens during the acute phase of infection. Negative results do not preclude SARS-CoV-2 infection, do not rule out co-infections with other pathogens, and should not be used as the sole basis for treatment or other patient management decisions. Negative results must be combined with clinical  observations, patient history, and epidemiological information. The expected result is Negative.  Fact Sheet for Patients: SugarRoll.be  Fact Sheet for Healthcare Providers: https://www.woods-mathews.com/  This test is not yet approved or cleared by the [place] and  has been authorized for detection and/or diagnosis of SARS-CoV-2 by FDA under an Emergency Use Authorization (EUA). This EUA will remain  in effect (meaning this test can be used) for the duration of the COVID-19 declaration under Se ction 564(b)(1) of the Act, 21 U.S.C. section 360bbb-3(b)(1), unless the authorization is terminated or revoked sooner.  Performed at [facility], [place] [address], [place], [place] [address]   Culture, blood (routine x 2)     Status: Abnormal   Collection Time: 07/06/20 10:04 PM   Specimen: BLOOD  Result Value Ref Range Status   Specimen Description BLOOD LEFT ARM  Final   Special Requests   Final    BOTTLES DRAWN AEROBIC AND ANAEROBIC Blood Culture adequate volume   Culture  Setup Time   Final    GRAM POSITIVE COCCI IN CHAINS IN BOTH AEROBIC AND ANAEROBIC BOTTLES CRITICAL RESULT CALLED TO, READ BACK BY AND VERIFIED WITH: G. ABBOTT,PHARMD 0000 07/08/2020 T. TYSOR Performed at [facility], [place] [address], [place], [place] [address]    Culture ENTEROCOCCUS FAECALIS (A)  Final   Report Status 07/09/2020 FINAL  Final   Organism ID, Bacteria ENTEROCOCCUS FAECALIS  Final      Susceptibility   Enterococcus faecalis - MIC*    AMPICILLIN <=2 SENSITIVE Sensitive     VANCOMYCIN 1 SENSITIVE Sensitive     GENTAMICIN SYNERGY SENSITIVE Sensitive     * ENTEROCOCCUS FAECALIS  Blood Culture ID Panel (Reflexed)     Status: Abnormal   Collection Time: 07/06/20 10:04 PM  Result Value Ref Range Status   Enterococcus faecalis DETECTED (A) NOT DETECTED Final    Comment: CRITICAL RESULT CALLED TO, READ BACK BY AND VERIFIED WITH: G.  ABBOTT,PHARMD 0000 07/08/2020 T. TYSOR    Enterococcus Faecium NOT DETECTED NOT DETECTED Final   Listeria monocytogenes NOT DETECTED NOT DETECTED Final   Staphylococcus species NOT DETECTED NOT DETECTED Final   Staphylococcus aureus (BCID) NOT DETECTED NOT DETECTED Final   Staphylococcus epidermidis NOT DETECTED NOT DETECTED Final   Staphylococcus lugdunensis NOT DETECTED NOT DETECTED Final   Streptococcus species NOT DETECTED NOT DETECTED Final   Streptococcus agalactiae NOT DETECTED NOT DETECTED Final   Streptococcus pneumoniae NOT DETECTED NOT DETECTED Final   Streptococcus pyogenes NOT DETECTED NOT DETECTED Final   A.calcoaceticus-baumannii NOT DETECTED NOT DETECTED Final   Bacteroides fragilis NOT DETECTED NOT DETECTED Final   Enterobacterales NOT DETECTED NOT DETECTED Final   Enterobacter cloacae complex NOT DETECTED NOT DETECTED Final   Escherichia coli NOT DETECTED NOT DETECTED Final   Klebsiella aerogenes NOT DETECTED NOT DETECTED Final   Klebsiella oxytoca NOT DETECTED NOT DETECTED Final   Klebsiella pneumoniae NOT DETECTED NOT DETECTED Final   Proteus species NOT DETECTED NOT DETECTED Final   Salmonella species NOT DETECTED NOT DETECTED Final   Serratia marcescens NOT DETECTED NOT DETECTED Final   Haemophilus influenzae NOT DETECTED NOT DETECTED Final   Neisseria meningitidis NOT DETECTED NOT DETECTED Final   Pseudomonas aeruginosa NOT DETECTED NOT DETECTED Final   Stenotrophomonas maltophilia NOT DETECTED NOT DETECTED Final   Candida albicans NOT DETECTED NOT DETECTED Final   Candida auris NOT DETECTED NOT DETECTED Final   Candida glabrata NOT DETECTED NOT DETECTED Final   Candida krusei NOT DETECTED NOT DETECTED Final   Candida parapsilosis NOT DETECTED NOT DETECTED Final   Candida tropicalis NOT DETECTED NOT DETECTED Final  Cryptococcus neoformans/gattii NOT DETECTED NOT DETECTED Final   Vancomycin resistance NOT DETECTED NOT DETECTED Final    Comment: Performed at  [facility], [place] [address], [place], [place] [address]  Culture, blood (routine x 2)     Status: Abnormal   Collection Time: 07/06/20 10:12 PM   Specimen: BLOOD  Result Value Ref Range Status   Specimen Description BLOOD RIGHT ARM  Final   Special Requests   Final    BOTTLES DRAWN AEROBIC ONLY Blood Culture adequate volume   Culture  Setup Time   Final    GRAM POSITIVE COCCI AEROBIC BOTTLE ONLY CRITICAL VALUE NOTED.  VALUE IS CONSISTENT WITH PREVIOUSLY REPORTED AND CALLED VALUE.    Culture (A)  Final    ENTEROCOCCUS FAECALIS SUSCEPTIBILITIES PERFORMED ON PREVIOUS CULTURE WITHIN THE LAST 5 DAYS. Performed at [facility], [place] [address] [place], [place] [address]    Report Status 07/09/2020 FINAL  Final  Culture, blood (routine x 2)     Status: Abnormal   Collection Time: 07/07/20  8:00 AM   Specimen: BLOOD  Result Value Ref Range Status   Specimen Description BLOOD LEFT ANTECUBITAL  Final   Special Requests   Final    BOTTLES DRAWN AEROBIC ONLY Blood Culture adequate volume   Culture  Setup Time   Final    GRAM POSITIVE COCCI IN CHAINS AEROBIC BOTTLE ONLY CRITICAL VALUE NOTED.  VALUE IS CONSISTENT WITH PREVIOUSLY REPORTED AND CALLED VALUE.    Culture (A)  Final    ENTEROCOCCUS FAECALIS SUSCEPTIBILITIES PERFORMED ON PREVIOUS CULTURE WITHIN THE LAST 5 DAYS. Performed at [facility], [place] [address], [place], [place] [address]    Report Status 07/09/2020 FINAL  Final  Culture, blood (routine x 2)     Status: Abnormal   Collection Time: 07/07/20  8:15 AM   Specimen: BLOOD  Result Value Ref Range Status   Specimen Description BLOOD RIGHT ANTECUBITAL  Final   Special Requests   Final    BOTTLES DRAWN AEROBIC ONLY Blood Culture adequate volume   Culture  Setup Time   Final    GRAM POSITIVE COCCI IN CHAINS AEROBIC BOTTLE ONLY CRITICAL VALUE NOTED.  VALUE IS CONSISTENT WITH PREVIOUSLY REPORTED AND CALLED VALUE.    Culture (A)  Final    ENTEROCOCCUS  FAECALIS SUSCEPTIBILITIES PERFORMED ON PREVIOUS CULTURE WITHIN THE LAST 5 DAYS. Performed at [facility], [place] [address] [place], [place] [address]    Report Status 07/09/2020 FINAL  Final  Culture, blood (routine x 2)     Status: None (Preliminary result)   Collection Time: 07/09/20  1:03 AM   Specimen: BLOOD  Result Value Ref Range Status   Specimen Description BLOOD RIGHT HAND  Final   Special Requests   Final    BOTTLES DRAWN AEROBIC AND ANAEROBIC Blood Culture adequate volume   Culture   Final    NO GROWTH 3 DAYS Performed at [facility], [place] [address] [place], [place] [address]    Report Status PENDING  Incomplete  Culture, blood (routine x 2)     Status: None (Preliminary result)   Collection Time: 07/09/20  2:55 AM   Specimen: BLOOD  Result Value Ref Range Status   Specimen Description BLOOD RIGHT ARM  Final   Special Requests   Final    BOTTLES DRAWN AEROBIC AND ANAEROBIC Blood Culture adequate volume   Culture   Final    NO GROWTH 3  DAYS Performed at [facility], [place] [address], [place], [place] [address]    Report Status PENDING  Incomplete    Studies/Results: [place] EKG SITE RITE  Result Date: 07/12/2020 If Site Rite image not attached, placement could not be confirmed due to current cardiac rhythm.     Assessment/Plan:  INTERVAL HISTORY:   Blood cultures no growth at 3 days now  Principal Problem:   Endocarditis of mitral valve Active Problems:   Hypothyroidism   Hyperlipidemia   Cervical spondylosis   Persistent atrial fibrillation ([place])   Fall at home, initial encounter   Fall   AKI (acute kidney injury) ([place])   Transaminitis   Pressure injury of skin   Protein-calorie malnutrition, severe   Acute lower UTI   Compression fracture of L1 vertebra ([place])   Bacteremia due to Enterococcus   Discitis    [name] is a 87 y.o. female with recent syncopal episode fall low back pain with MRI concerning for discitis.  In  the interim her blood cultures have turned positive for Enterococcus faecalis and a transthoracic echocardiogram that orders have showed a large vegetation on the mitral valve.  She has not an operative candidate for cardiothoracic surgery.  Enterococcus faecalis endocarditis of mitral valve:  Continue dual beta-lactam therapy to complete 6 weeks of systemic antibiotics  I am concerned given the size of her vegetation that it may embolized to her central nervous system or kidneys.      Discitis: Undoubtably due to the same organism seen in her blood cultures  I will schedule her for followup with [place] early in her courseif she goes to SNF and then 2 weeks after she completes her antibiotics to perform surveillance cultures.    LOS: 8 days   Alcide Evener 07/12/2020, 7:39 PM

## 2020-07-12 NOTE — Progress Notes (Signed)
IP rehab admissions - I have received a denial from insurance carrier for acute inpatient rehab admission.  I will talk with patient to see if she would like to proceed with an expedited appeal.  Call for questions.  [phone]

## 2020-07-12 NOTE — TOC Progression Note (Signed)
Transition of Care [facility]) - Progression Note    Patient Details  Name: DECIE VERNE MRN: [id number] Date of Birth: 1932/06/19  Transition [facility]) CM/SW [place], LCSW Phone Number: 07/12/2020, 9:26 AM  Clinical Narrative:    CSW received notification from [place], [place], that Everlene Balls has requested a peer to peer on the patient for CIR.   [phone], option #5, deadline 12pm today. Info given to MD.  CSW also requested [facility] review patient in the event peer to peer is denied as patient is adamant that she cannot go home.    Expected Discharge Plan: IP Rehab Facility Barriers to Discharge: Insurance Authorization,Continued Medical Work up  Expected Discharge Plan and Services Expected Discharge Plan: [place] In-house Referral: Clinical Social Work   Post Acute Care Choice: IP Rehab Living arrangements for the past 2 months: Single Family Home                                       Social Determinants of Health (SDOH) Interventions    Readmission Risk Interventions No flowsheet data found.

## 2020-07-13 DIAGNOSIS — I5020 Unspecified systolic (congestive) heart failure: Secondary | ICD-10-CM | POA: Diagnosis not present

## 2020-07-13 DIAGNOSIS — J9601 Acute respiratory failure with hypoxia: Secondary | ICD-10-CM | POA: Diagnosis not present

## 2020-07-13 DIAGNOSIS — I248 Other forms of acute ischemic heart disease: Secondary | ICD-10-CM | POA: Diagnosis not present

## 2020-07-13 DIAGNOSIS — R7881 Bacteremia: Secondary | ICD-10-CM | POA: Diagnosis not present

## 2020-07-13 DIAGNOSIS — Z743 Need for continuous supervision: Secondary | ICD-10-CM | POA: Diagnosis not present

## 2020-07-13 DIAGNOSIS — I25810 Atherosclerosis of coronary artery bypass graft(s) without angina pectoris: Secondary | ICD-10-CM | POA: Diagnosis not present

## 2020-07-13 DIAGNOSIS — N189 Chronic kidney disease, unspecified: Secondary | ICD-10-CM | POA: Diagnosis not present

## 2020-07-13 DIAGNOSIS — R601 Generalized edema: Secondary | ICD-10-CM | POA: Diagnosis not present

## 2020-07-13 DIAGNOSIS — K52832 Lymphocytic colitis: Secondary | ICD-10-CM | POA: Diagnosis not present

## 2020-07-13 DIAGNOSIS — I130 Hypertensive heart and chronic kidney disease with heart failure and stage 1 through stage 4 chronic kidney disease, or unspecified chronic kidney disease: Secondary | ICD-10-CM | POA: Diagnosis not present

## 2020-07-13 DIAGNOSIS — S32019A Unspecified fracture of first lumbar vertebra, initial encounter for closed fracture: Secondary | ICD-10-CM | POA: Diagnosis not present

## 2020-07-13 DIAGNOSIS — N179 Acute kidney failure, unspecified: Secondary | ICD-10-CM | POA: Diagnosis not present

## 2020-07-13 DIAGNOSIS — E43 Unspecified severe protein-calorie malnutrition: Secondary | ICD-10-CM | POA: Diagnosis not present

## 2020-07-13 DIAGNOSIS — R5381 Other malaise: Secondary | ICD-10-CM | POA: Diagnosis not present

## 2020-07-13 DIAGNOSIS — R0602 Shortness of breath: Secondary | ICD-10-CM | POA: Diagnosis not present

## 2020-07-13 DIAGNOSIS — I4891 Unspecified atrial fibrillation: Secondary | ICD-10-CM | POA: Diagnosis not present

## 2020-07-13 DIAGNOSIS — I330 Acute and subacute infective endocarditis: Secondary | ICD-10-CM | POA: Diagnosis not present

## 2020-07-13 DIAGNOSIS — L89610 Pressure ulcer of right heel, unstageable: Secondary | ICD-10-CM | POA: Diagnosis not present

## 2020-07-13 DIAGNOSIS — E876 Hypokalemia: Secondary | ICD-10-CM | POA: Diagnosis not present

## 2020-07-13 DIAGNOSIS — I5041 Acute combined systolic (congestive) and diastolic (congestive) heart failure: Secondary | ICD-10-CM | POA: Diagnosis not present

## 2020-07-13 DIAGNOSIS — Z66 Do not resuscitate: Secondary | ICD-10-CM | POA: Diagnosis not present

## 2020-07-13 DIAGNOSIS — R001 Bradycardia, unspecified: Secondary | ICD-10-CM | POA: Diagnosis not present

## 2020-07-13 DIAGNOSIS — I517 Cardiomegaly: Secondary | ICD-10-CM | POA: Diagnosis not present

## 2020-07-13 DIAGNOSIS — I110 Hypertensive heart disease with heart failure: Secondary | ICD-10-CM | POA: Diagnosis not present

## 2020-07-13 DIAGNOSIS — D631 Anemia in chronic kidney disease: Secondary | ICD-10-CM | POA: Diagnosis not present

## 2020-07-13 DIAGNOSIS — M792 Neuralgia and neuritis, unspecified: Secondary | ICD-10-CM | POA: Diagnosis not present

## 2020-07-13 DIAGNOSIS — M545 Low back pain, unspecified: Secondary | ICD-10-CM | POA: Diagnosis not present

## 2020-07-13 DIAGNOSIS — E785 Hyperlipidemia, unspecified: Secondary | ICD-10-CM | POA: Diagnosis not present

## 2020-07-13 DIAGNOSIS — Z20822 Contact with and (suspected) exposure to covid-19: Secondary | ICD-10-CM | POA: Diagnosis not present

## 2020-07-13 DIAGNOSIS — I4819 Other persistent atrial fibrillation: Secondary | ICD-10-CM | POA: Diagnosis not present

## 2020-07-13 DIAGNOSIS — M5459 Other low back pain: Secondary | ICD-10-CM | POA: Diagnosis not present

## 2020-07-13 DIAGNOSIS — K5900 Constipation, unspecified: Secondary | ICD-10-CM | POA: Diagnosis not present

## 2020-07-13 DIAGNOSIS — I10 Essential (primary) hypertension: Secondary | ICD-10-CM | POA: Diagnosis not present

## 2020-07-13 DIAGNOSIS — S32010S Wedge compression fracture of first lumbar vertebra, sequela: Secondary | ICD-10-CM | POA: Diagnosis not present

## 2020-07-13 DIAGNOSIS — M4640 Discitis, unspecified, site unspecified: Secondary | ICD-10-CM | POA: Diagnosis not present

## 2020-07-13 DIAGNOSIS — I509 Heart failure, unspecified: Secondary | ICD-10-CM | POA: Diagnosis not present

## 2020-07-13 DIAGNOSIS — Y92239 Unspecified place in hospital as the place of occurrence of the external cause: Secondary | ICD-10-CM | POA: Diagnosis not present

## 2020-07-13 DIAGNOSIS — R6889 Other general symptoms and signs: Secondary | ICD-10-CM | POA: Diagnosis not present

## 2020-07-13 DIAGNOSIS — R778 Other specified abnormalities of plasma proteins: Secondary | ICD-10-CM | POA: Diagnosis not present

## 2020-07-13 DIAGNOSIS — M47812 Spondylosis without myelopathy or radiculopathy, cervical region: Secondary | ICD-10-CM | POA: Diagnosis not present

## 2020-07-13 DIAGNOSIS — L89620 Pressure ulcer of left heel, unstageable: Secondary | ICD-10-CM | POA: Diagnosis not present

## 2020-07-13 DIAGNOSIS — Z79899 Other long term (current) drug therapy: Secondary | ICD-10-CM | POA: Diagnosis not present

## 2020-07-13 DIAGNOSIS — I059 Rheumatic mitral valve disease, unspecified: Secondary | ICD-10-CM | POA: Diagnosis not present

## 2020-07-13 DIAGNOSIS — R55 Syncope and collapse: Secondary | ICD-10-CM | POA: Diagnosis not present

## 2020-07-13 DIAGNOSIS — I455 Other specified heart block: Secondary | ICD-10-CM | POA: Diagnosis not present

## 2020-07-13 DIAGNOSIS — R7401 Elevation of levels of liver transaminase levels: Secondary | ICD-10-CM | POA: Diagnosis not present

## 2020-07-13 DIAGNOSIS — Z681 Body mass index (BMI) 19 or less, adult: Secondary | ICD-10-CM | POA: Diagnosis not present

## 2020-07-13 DIAGNOSIS — R279 Unspecified lack of coordination: Secondary | ICD-10-CM | POA: Diagnosis not present

## 2020-07-13 DIAGNOSIS — E739 Lactose intolerance, unspecified: Secondary | ICD-10-CM | POA: Diagnosis not present

## 2020-07-13 DIAGNOSIS — I447 Left bundle-branch block, unspecified: Secondary | ICD-10-CM | POA: Diagnosis not present

## 2020-07-13 DIAGNOSIS — R609 Edema, unspecified: Secondary | ICD-10-CM | POA: Diagnosis not present

## 2020-07-13 DIAGNOSIS — W19XXXA Unspecified fall, initial encounter: Secondary | ICD-10-CM | POA: Diagnosis not present

## 2020-07-13 DIAGNOSIS — I5021 Acute systolic (congestive) heart failure: Secondary | ICD-10-CM | POA: Diagnosis not present

## 2020-07-13 DIAGNOSIS — M4645 Discitis, unspecified, thoracolumbar region: Secondary | ICD-10-CM

## 2020-07-13 DIAGNOSIS — I442 Atrioventricular block, complete: Secondary | ICD-10-CM | POA: Diagnosis not present

## 2020-07-13 DIAGNOSIS — Z951 Presence of aortocoronary bypass graft: Secondary | ICD-10-CM | POA: Diagnosis not present

## 2020-07-13 DIAGNOSIS — R0902 Hypoxemia: Secondary | ICD-10-CM | POA: Diagnosis not present

## 2020-07-13 DIAGNOSIS — M5136 Other intervertebral disc degeneration, lumbar region: Secondary | ICD-10-CM | POA: Diagnosis not present

## 2020-07-13 DIAGNOSIS — W19XXXD Unspecified fall, subsequent encounter: Secondary | ICD-10-CM | POA: Diagnosis not present

## 2020-07-13 DIAGNOSIS — B952 Enterococcus as the cause of diseases classified elsewhere: Secondary | ICD-10-CM | POA: Diagnosis not present

## 2020-07-13 DIAGNOSIS — N1831 Chronic kidney disease, stage 3a: Secondary | ICD-10-CM | POA: Diagnosis not present

## 2020-07-13 DIAGNOSIS — J90 Pleural effusion, not elsewhere classified: Secondary | ICD-10-CM | POA: Diagnosis not present

## 2020-07-13 DIAGNOSIS — I2510 Atherosclerotic heart disease of native coronary artery without angina pectoris: Secondary | ICD-10-CM | POA: Diagnosis not present

## 2020-07-13 DIAGNOSIS — G8929 Other chronic pain: Secondary | ICD-10-CM | POA: Diagnosis not present

## 2020-07-13 DIAGNOSIS — I5043 Acute on chronic combined systolic (congestive) and diastolic (congestive) heart failure: Secondary | ICD-10-CM | POA: Diagnosis not present

## 2020-07-13 DIAGNOSIS — S32010D Wedge compression fracture of first lumbar vertebra, subsequent encounter for fracture with routine healing: Secondary | ICD-10-CM | POA: Diagnosis not present

## 2020-07-13 DIAGNOSIS — R2681 Unsteadiness on feet: Secondary | ICD-10-CM | POA: Diagnosis not present

## 2020-07-13 DIAGNOSIS — E039 Hypothyroidism, unspecified: Secondary | ICD-10-CM | POA: Diagnosis not present

## 2020-07-13 DIAGNOSIS — I358 Other nonrheumatic aortic valve disorders: Secondary | ICD-10-CM | POA: Diagnosis not present

## 2020-07-13 LAB — CBC WITH DIFFERENTIAL/PLATELET
Abs Immature Granulocytes: 0.05 10*3/uL (ref 0.00–0.07)
Basophils Absolute: 0.1 10*3/uL (ref 0.0–0.1)
Basophils Relative: 1 %
Eosinophils Absolute: 0 10*3/uL (ref 0.0–0.5)
Eosinophils Relative: 0 %
HCT: 26.2 % — ABNORMAL LOW (ref 36.0–46.0)
Hemoglobin: 9.2 g/dL — ABNORMAL LOW (ref 12.0–15.0)
Immature Granulocytes: 1 %
Lymphocytes Relative: 9 %
Lymphs Abs: 0.8 10*3/uL (ref 0.7–4.0)
MCH: 28.6 pg (ref 26.0–34.0)
MCHC: 35.1 g/dL (ref 30.0–36.0)
MCV: 81.4 fL (ref 80.0–100.0)
Monocytes Absolute: 0.7 10*3/uL (ref 0.1–1.0)
Monocytes Relative: 8 %
Neutro Abs: 6.8 10*3/uL (ref 1.7–7.7)
Neutrophils Relative %: 81 %
Platelets: 186 10*3/uL (ref 150–400)
RBC: 3.22 MIL/uL — ABNORMAL LOW (ref 3.87–5.11)
RDW: 16.1 % — ABNORMAL HIGH (ref 11.5–15.5)
WBC: 8.4 10*3/uL (ref 4.0–10.5)
nRBC: 0 % (ref 0.0–0.2)

## 2020-07-13 LAB — COMPREHENSIVE METABOLIC PANEL
ALT: 71 U/L — ABNORMAL HIGH (ref 0–44)
AST: 45 U/L — ABNORMAL HIGH (ref 15–41)
Albumin: 1.9 g/dL — ABNORMAL LOW (ref 3.5–5.0)
Alkaline Phosphatase: 75 U/L (ref 38–126)
Anion gap: 5 (ref 5–15)
BUN: 20 mg/dL (ref 8–23)
CO2: 21 mmol/L — ABNORMAL LOW (ref 22–32)
Calcium: 7.6 mg/dL — ABNORMAL LOW (ref 8.9–10.3)
Chloride: 104 mmol/L (ref 98–111)
Creatinine, Ser: 1.05 mg/dL — ABNORMAL HIGH (ref 0.44–1.00)
GFR, Estimated: 52 mL/min — ABNORMAL LOW (ref >60)
Glucose, Bld: 88 mg/dL (ref 70–99)
Potassium: 4.3 mmol/L (ref 3.5–5.1)
Sodium: 130 mmol/L — ABNORMAL LOW (ref 135–145)
Total Bilirubin: 0.7 mg/dL (ref 0.3–1.2)
Total Protein: 5 g/dL — ABNORMAL LOW (ref 6.5–8.1)

## 2020-07-13 LAB — RESP PANEL BY RT-PCR (FLU A&B, COVID) ARPGX2
Influenza A by PCR: NEGATIVE
Influenza B by PCR: NEGATIVE
SARS Coronavirus 2 by RT PCR: NEGATIVE

## 2020-07-13 LAB — MAGNESIUM: Magnesium: 2.1 mg/dL (ref 1.7–2.4)

## 2020-07-13 LAB — PHOSPHORUS: Phosphorus: 3.1 mg/dL (ref 2.5–4.6)

## 2020-07-13 MED ORDER — BOOST PLUS PO LIQD: 237.0000 mL | Freq: Three times a day (TID) | ORAL | 0 refills | Status: AC

## 2020-07-13 MED ORDER — SODIUM CHLORIDE 0.9 % IV SOLN: 2.0000 g | Freq: Two times a day (BID) | INTRAVENOUS | Status: DC

## 2020-07-13 MED ORDER — SODIUM CHLORIDE 0.9 % IV SOLN: 2.0000 g | Freq: Three times a day (TID) | INTRAVENOUS | Status: DC

## 2020-07-13 MED ORDER — ACETAMINOPHEN 500 MG PO TABS: 1000.0000 mg | ORAL_TABLET | Freq: Three times a day (TID) | ORAL | 0 refills | Status: AC | PRN

## 2020-07-13 MED ORDER — CHLORHEXIDINE GLUCONATE CLOTH 2 % EX PADS: 6.0000 | MEDICATED_PAD | Freq: Every day | CUTANEOUS | Status: DC

## 2020-07-13 MED ORDER — OXYCODONE HCL 5 MG PO TABS: 5.0000 mg | ORAL_TABLET | Freq: Four times a day (QID) | ORAL | 0 refills | Status: DC | PRN

## 2020-07-13 MED ORDER — SODIUM CHLORIDE 0.9% FLUSH: 10.0000 mL | INTRAVENOUS | Status: DC | PRN

## 2020-07-13 MED ORDER — POLYETHYLENE GLYCOL 3350 17 G PO PACK: 17.0000 g | PACK | Freq: Every day | ORAL | 0 refills | Status: AC

## 2020-07-13 NOTE — TOC Progression Note (Signed)
Transition of Care [facility]) - Progression Note    Patient Details  Name: [name] MRN: [id number] Date of Birth: 1933/02/21  Transition of Care [facility]) CM/SW [place], LCSW Phone Number: 07/13/2020, 9:01 AM  Clinical Narrative:    CSW requested insurance update authorization to [employer] (Ref #[id number]). Olivia Mackie with [facility] said they have a bed at that location. CSW requested rapid COVID since it was not taken yesterday.    Expected Discharge Plan: IP Rehab Facility Barriers to Discharge: Insurance Authorization,Continued Medical Work up  Expected Discharge Plan and Services Expected Discharge Plan: [place] In-house Referral: Clinical Social Work   Post Acute Care Choice: IP Rehab Living arrangements for the past 2 months: Single Family Home                                       Social Determinants of Health (SDOH) Interventions    Readmission Risk Interventions No flowsheet data found.

## 2020-07-13 NOTE — Progress Notes (Addendum)
Subjective:  No new complaints  Antibiotics:  Anti-infectives (From admission, onward)   Start     Dose/Rate Route Frequency Ordered Stop   07/13/20 0000  ampicillin 2 g in sodium chloride 0.9 % 100 mL        2 g Intravenous Every 8 hours 07/13/20 1102     07/13/20 0000  cefTRIAXone 2 g in sodium chloride 0.9 % 100 mL        2 g Intravenous Every 12 hours 07/13/20 1102     07/08/20 1000  cefTRIAXone (ROCEPHIN) 2 g in sodium chloride 0.9 % 100 mL IVPB        2 g 200 mL/hr over 30 Minutes Intravenous Every 12 hours 07/08/20 0744     07/08/20 0800  ampicillin (OMNIPEN) 2 g in sodium chloride 0.9 % 100 mL IVPB       Note to Pharmacy: Ampicillin 2 g IV q8h for CrCL < 30 mL/min   2 g 300 mL/hr over 20 Minutes Intravenous Every 8 hours 07/08/20 0744     07/07/20 0915  DAPTOmycin (CUBICIN) 365 mg in sodium chloride 0.9 % IVPB  Status:  Discontinued        8 mg/kg  45.6 kg 214.6 mL/hr over 30 Minutes Intravenous Every 48 hours 07/07/20 0824 07/08/20 0744   07/07/20 0900  cefTRIAXone (ROCEPHIN) 2 g in sodium chloride 0.9 % 100 mL IVPB  Status:  Discontinued        2 g 200 mL/hr over 30 Minutes Intravenous Daily 07/07/20 0812 07/08/20 0744   07/05/20 1030  cefTRIAXone (ROCEPHIN) 1 g in sodium chloride 0.9 % 100 mL IVPB  Status:  Discontinued        1 g 200 mL/hr over 30 Minutes Intravenous Every 24 hours 07/04/20 1326 07/06/20 1133   07/04/20 1030  cefTRIAXone (ROCEPHIN) 1 g in sodium chloride 0.9 % 100 mL IVPB        1 g 200 mL/hr over 30 Minutes Intravenous  Once 07/04/20 1025 07/04/20 1107      Medications: Scheduled Meds: . acetaminophen  1,000 mg Oral Q8H  . apixaban  2.5 mg Oral BID  . Chlorhexidine Gluconate Cloth  6 each Topical Daily  . ipratropium  1 spray Each Nare Q12H  . lactose free nutrition  237 mL Oral TID WC  . levothyroxine  50 mcg Oral QHS  . lidocaine  1 patch Transdermal Q24H  . multivitamin with minerals  1 tablet Oral Daily  . polyethylene glycol   17 g Oral Daily  . saccharomyces boulardii  250 mg Oral BID   Continuous Infusions: . ampicillin (OMNIPEN) IV 2 g (07/13/20 0855)  . cefTRIAXone (ROCEPHIN)  IV 2 g (07/13/20 0950)   PRN Meds:.HYDROmorphone (DILAUDID) injection, hyoscyamine, labetalol, lactase, lidocaine, oxyCODONE, sodium chloride, sodium chloride flush, sucralfate, traMADol    Objective: Weight change:  No intake or output data in the 24 hours ending 07/13/20 1245 Blood pressure (!) 140/51, pulse (!) 53, temperature 97.8 F (36.6 C), temperature source Oral, resp. rate 18, height _0  (1.626 m), weight 43.4 kg, SpO2 92 %. Temp:  [97.8 F (36.6 C)-97.9 F (36.6 C)] 97.8 F (36.6 C) (03/18 0444) Pulse Rate:  [53] 53 (03/18 0444) Resp:  [18-20] 18 (03/18 0941) BP: (94-140)/(51-64) 140/51 (03/18 0444) SpO2:  [92 %-93 %] 92 % (03/18 0444)  Physical Exam: Physical Exam Constitutional:      Appearance: She is underweight.  Cardiovascular:  Rate and Rhythm: Tachycardia present.     Heart sounds: No murmur heard. No friction rub.  Pulmonary:     Effort: Pulmonary effort is normal. No respiratory distress.     Breath sounds: Normal breath sounds. No stridor. No wheezing or rhonchi.  Abdominal:     General: Bowel sounds are normal. There is no distension.     Palpations: There is no mass.  Neurological:     General: No focal deficit present.     Mental Status: She is alert and oriented to person, place, and time.  Psychiatric:        Attention and Perception: Attention normal.        Mood and Affect: Mood is  Less anxious       Speech: Speech normal.        Behavior: Behavior normal.        Thought Content: Thought content normal.        Cognition and Memory: Cognition normal.        Judgment: Judgment normal.      CBC:    BMET Recent Labs    07/12/20 0715 07/13/20 0057  NA 131* 130*  K 4.6 4.3  CL 103 104  CO2 21* 21*  GLUCOSE 80 88  BUN 21 20  CREATININE 1.13* 1.05*  CALCIUM 7.5* 7.6*      Liver Panel  Recent Labs    07/11/20 0059 07/13/20 0057  PROT 4.7* 5.0*  ALBUMIN 1.8* 1.9*  AST 47* 45*  ALT 87* 71*  ALKPHOS 77 75  BILITOT 0.7 0.7       Sedimentation Rate No results for input(s): ESRSEDRATE in the last 72 hours. C-Reactive Protein No results for input(s): CRP in the last 72 hours.  Micro Results: Recent Results (from the past 720 hour(s))  Urine culture     Status: Abnormal   Collection Time: 07/04/20  8:09 AM   Specimen: Urine, Random  Result Value Ref Range Status   Specimen Description URINE, RANDOM  Final   Special Requests   Final    NONE Performed at [facility], [address], [place], [place] [address]    Culture >=100,000 COLONIES/mL ESCHERICHIA COLI (A)  Final   Report Status 07/06/2020 FINAL  Final   Organism ID, Bacteria ESCHERICHIA COLI (A)  Final      Susceptibility   Escherichia coli - MIC*    AMPICILLIN 4 SENSITIVE Sensitive     CEFAZOLIN <=4 SENSITIVE Sensitive     CEFEPIME <=0.12 SENSITIVE Sensitive     CEFTRIAXONE <=0.25 SENSITIVE Sensitive     CIPROFLOXACIN <=0.25 SENSITIVE Sensitive     GENTAMICIN <=1 SENSITIVE Sensitive     IMIPENEM <=0.25 SENSITIVE Sensitive     NITROFURANTOIN <=16 SENSITIVE Sensitive     TRIMETH/SULFA >=320 RESISTANT Resistant     AMPICILLIN/SULBACTAM <=2 SENSITIVE Sensitive     PIP/TAZO <=4 SENSITIVE Sensitive     * >=100,000 COLONIES/mL ESCHERICHIA COLI  SARS CORONAVIRUS 2 (TAT 6-24 HRS) Nasopharyngeal Nasopharyngeal Swab     Status: None   Collection Time: 07/04/20 10:21 PM   Specimen: Nasopharyngeal Swab  Result Value Ref Range Status   SARS Coronavirus 2 NEGATIVE NEGATIVE Final    Comment: (NOTE) SARS-CoV-2 target nucleic acids are NOT DETECTED.  The SARS-CoV-2 RNA is generally detectable in upper and lower respiratory specimens during the acute phase of infection. Negative results do not preclude SARS-CoV-2 infection, do not rule out co-infections with other pathogens, and  should not  be used as the sole basis for treatment or other patient management decisions. Negative results must be combined with clinical observations, patient history, and epidemiological information. The expected result is Negative.  Fact Sheet for Patients: SugarRoll.be  Fact Sheet for Healthcare Providers: https://www.woods-mathews.com/  This test is not yet approved or cleared by the [place] FDA and  has been authorized for detection and/or diagnosis of SARS-CoV-2 by FDA under an Emergency Use Authorization (EUA). This EUA will remain  in effect (meaning this test can be used) for the duration of the COVID-19 declaration under Se ction 564(b)(1) of the Act, 21 U.S.C. section 360bbb-3(b)(1), unless the authorization is terminated or revoked sooner.  Performed at [facility], [place] [address], [place], [place] [address]   Culture, blood (routine x 2)     Status: Abnormal   Collection Time: 07/06/20 10:04 PM   Specimen: BLOOD  Result Value Ref Range Status   Specimen Description BLOOD LEFT ARM  Final   Special Requests   Final    BOTTLES DRAWN AEROBIC AND ANAEROBIC Blood Culture adequate volume   Culture  Setup Time   Final    GRAM POSITIVE COCCI IN CHAINS IN BOTH AEROBIC AND ANAEROBIC BOTTLES CRITICAL RESULT CALLED TO, READ BACK BY AND VERIFIED WITH: G. ABBOTT,PHARMD 0000 07/08/2020 T. TYSOR Performed at [facility], [place] [address] [place],  [address]    Culture ENTEROCOCCUS FAECALIS (A)  Final   Report Status 07/09/2020 FINAL  Final   Organism ID, Bacteria ENTEROCOCCUS FAECALIS  Final      Susceptibility   Enterococcus faecalis - MIC*    AMPICILLIN <=2 SENSITIVE Sensitive     VANCOMYCIN 1 SENSITIVE Sensitive     GENTAMICIN SYNERGY SENSITIVE Sensitive     * ENTEROCOCCUS FAECALIS  Blood Culture ID Panel (Reflexed)     Status: Abnormal   Collection Time: 07/06/20 10:04 PM  Result Value Ref Range Status    Enterococcus faecalis DETECTED (A) NOT DETECTED Final    Comment: CRITICAL RESULT CALLED TO, READ BACK BY AND VERIFIED WITH: G. ABBOTT,PHARMD 0000 07/08/2020 T. TYSOR    Enterococcus Faecium NOT DETECTED NOT DETECTED Final   Listeria monocytogenes NOT DETECTED NOT DETECTED Final   Staphylococcus species NOT DETECTED NOT DETECTED Final   Staphylococcus aureus (BCID) NOT DETECTED NOT DETECTED Final   Staphylococcus epidermidis NOT DETECTED NOT DETECTED Final   Staphylococcus lugdunensis NOT DETECTED NOT DETECTED Final   Streptococcus species NOT DETECTED NOT DETECTED Final   Streptococcus agalactiae NOT DETECTED NOT DETECTED Final   Streptococcus pneumoniae NOT DETECTED NOT DETECTED Final   Streptococcus pyogenes NOT DETECTED NOT DETECTED Final   A.calcoaceticus-baumannii NOT DETECTED NOT DETECTED Final   Bacteroides fragilis NOT DETECTED NOT DETECTED Final   Enterobacterales NOT DETECTED NOT DETECTED Final   Enterobacter cloacae complex NOT DETECTED NOT DETECTED Final   Escherichia coli NOT DETECTED NOT DETECTED Final   Klebsiella aerogenes NOT DETECTED NOT DETECTED Final   Klebsiella oxytoca NOT DETECTED NOT DETECTED Final   Klebsiella pneumoniae NOT DETECTED NOT DETECTED Final   Proteus species NOT DETECTED NOT DETECTED Final   Salmonella species NOT DETECTED NOT DETECTED Final   Serratia marcescens NOT DETECTED NOT DETECTED Final   Haemophilus influenzae NOT DETECTED NOT DETECTED Final   Neisseria meningitidis NOT DETECTED NOT DETECTED Final   Pseudomonas aeruginosa NOT DETECTED NOT DETECTED Final   Stenotrophomonas maltophilia NOT DETECTED NOT DETECTED Final   Candida albicans NOT DETECTED NOT DETECTED Final  Candida auris NOT DETECTED NOT DETECTED Final   Candida glabrata NOT DETECTED NOT DETECTED Final   Candida krusei NOT DETECTED NOT DETECTED Final   Candida parapsilosis NOT DETECTED NOT DETECTED Final   Candida tropicalis NOT DETECTED NOT DETECTED Final   Cryptococcus  neoformans/gattii NOT DETECTED NOT DETECTED Final   Vancomycin resistance NOT DETECTED NOT DETECTED Final    Comment: Performed at [facility] [address], [place], [place] [address]  Culture, blood (routine x 2)     Status: Abnormal   Collection Time: 07/06/20 10:12 PM   Specimen: BLOOD  Result Value Ref Range Status   Specimen Description BLOOD RIGHT ARM  Final   Special Requests   Final    BOTTLES DRAWN AEROBIC ONLY Blood Culture adequate volume   Culture  Setup Time   Final    GRAM POSITIVE COCCI AEROBIC BOTTLE ONLY CRITICAL VALUE NOTED.  VALUE IS CONSISTENT WITH PREVIOUSLY REPORTED AND CALLED VALUE.    Culture (A)  Final    ENTEROCOCCUS FAECALIS SUSCEPTIBILITIES PERFORMED ON PREVIOUS CULTURE WITHIN THE LAST 5 DAYS. Performed at [facility], [place] [address], [place], [place] [address]    Report Status 07/09/2020 FINAL  Final  Culture, blood (routine x 2)     Status: Abnormal   Collection Time: 07/07/20  8:00 AM   Specimen: BLOOD  Result Value Ref Range Status   Specimen Description BLOOD LEFT ANTECUBITAL  Final   Special Requests   Final    BOTTLES DRAWN AEROBIC ONLY Blood Culture adequate volume   Culture  Setup Time   Final    GRAM POSITIVE COCCI IN CHAINS AEROBIC BOTTLE ONLY CRITICAL VALUE NOTED.  VALUE IS CONSISTENT WITH PREVIOUSLY REPORTED AND CALLED VALUE.    Culture (A)  Final    ENTEROCOCCUS FAECALIS SUSCEPTIBILITIES PERFORMED ON PREVIOUS CULTURE WITHIN THE LAST 5 DAYS. Performed at [facility], [place] [address], [place], [place] [address]    Report Status 07/09/2020 FINAL  Final  Culture, blood (routine x 2)     Status: Abnormal   Collection Time: 07/07/20  8:15 AM   Specimen: BLOOD  Result Value Ref Range Status   Specimen Description BLOOD RIGHT ANTECUBITAL  Final   Special Requests   Final    BOTTLES DRAWN AEROBIC ONLY Blood Culture adequate volume   Culture  Setup Time   Final    GRAM POSITIVE COCCI IN CHAINS AEROBIC BOTTLE  ONLY CRITICAL VALUE NOTED.  VALUE IS CONSISTENT WITH PREVIOUSLY REPORTED AND CALLED VALUE.    Culture (A)  Final    ENTEROCOCCUS FAECALIS SUSCEPTIBILITIES PERFORMED ON PREVIOUS CULTURE WITHIN THE LAST 5 DAYS. Performed at [facility], [place] [address] [place], [place] [address]    Report Status 07/09/2020 FINAL  Final  Culture, blood (routine x 2)     Status: None (Preliminary result)   Collection Time: 07/09/20  1:03 AM   Specimen: BLOOD  Result Value Ref Range Status   Specimen Description BLOOD RIGHT HAND  Final   Special Requests   Final    BOTTLES DRAWN AEROBIC AND ANAEROBIC Blood Culture adequate volume   Culture   Final    NO GROWTH 4 DAYS Performed at [facility], [place] [address] [place], [place] [address]    Report Status PENDING  Incomplete  Culture, blood (routine x 2)     Status: None (Preliminary result)   Collection Time: 07/09/20  2:55 AM   Specimen: BLOOD  Result Value Ref Range Status   Specimen Description BLOOD RIGHT ARM  Final   Special Requests   Final    BOTTLES DRAWN AEROBIC AND ANAEROBIC Blood Culture adequate volume   Culture   Final    NO GROWTH 4 DAYS Performed at [facility], [address], [place], [place] [address]    Report Status PENDING  Incomplete  Resp Panel by RT-PCR (Flu A&B, Covid) Nasopharyngeal Swab     Status: None   Collection Time: 07/13/20  9:43 AM   Specimen: Nasopharyngeal Swab; Nasopharyngeal(NP) swabs in vial transport medium  Result Value Ref Range Status   SARS Coronavirus 2 by RT PCR NEGATIVE NEGATIVE Final    Comment: (NOTE) SARS-CoV-2 target nucleic acids are NOT DETECTED.  The SARS-CoV-2 RNA is generally detectable in upper respiratory specimens during the acute phase of infection. The lowest concentration of SARS-CoV-2 viral copies this assay can detect is 138 copies/mL. A negative result does not preclude SARS-Cov-2 infection and should not be used as the sole basis for treatment or other  patient management decisions. A negative result may occur with  improper specimen collection/handling, submission of specimen other than nasopharyngeal swab, presence of viral mutation(s) within the areas targeted by this assay, and inadequate number of viral copies(<138 copies/mL). A negative result must be combined with clinical observations, patient history, and epidemiological information. The expected result is Negative.  Fact Sheet for Patients:  EntrepreneurPulse.com.au  Fact Sheet for Healthcare Providers:  IncredibleEmployment.be  This test is no t yet approved or cleared by the [place] FDA and  has been authorized for detection and/or diagnosis of SARS-CoV-2 by FDA under an Emergency Use Authorization (EUA). This EUA will remain  in effect (meaning this test can be used) for the duration of the COVID-19 declaration under Section 564(b)(1) of the Act, 21 U.S.C.section 360bbb-3(b)(1), unless the authorization is terminated  or revoked sooner.       Influenza A by PCR NEGATIVE NEGATIVE Final   Influenza B by PCR NEGATIVE NEGATIVE Final    Comment: (NOTE) The Xpert Xpress SARS-CoV-2/FLU/RSV plus assay is intended as an aid in the diagnosis of influenza from Nasopharyngeal swab specimens and should not be used as a sole basis for treatment. Nasal washings and aspirates are unacceptable for Xpert Xpress SARS-CoV-2/FLU/RSV testing.  Fact Sheet for Patients: EntrepreneurPulse.com.au  Fact Sheet for Healthcare Providers: IncredibleEmployment.be  This test is not yet approved or cleared by the [place] FDA and has been authorized for detection and/or diagnosis of SARS-CoV-2 by FDA under an Emergency Use Authorization (EUA). This EUA will remain in effect (meaning this test can be used) for the duration of the COVID-19 declaration under Section 564(b)(1) of the Act, 21 U.S.C. section  360bbb-3(b)(1), unless the authorization is terminated or revoked.  Performed at [facility], [place] [address] [place], [place] [address]     Studies/Results: [place] EKG SITE RITE  Result Date: 07/12/2020 If [facility] image not attached, placement could not be confirmed due to current cardiac rhythm.     Assessment/Plan:  INTERVAL HISTORY:   Blood cultures no growth at 4 days now  Principal Problem:   Endocarditis of mitral valve Active Problems:   Hypothyroidism   Hyperlipidemia   Cervical spondylosis   Persistent atrial fibrillation ([place])   Fall at home, initial encounter   Fall   AKI (acute kidney injury) ([place])   Transaminitis   Pressure injury of skin   Protein-calorie malnutrition, severe  Acute lower UTI   Compression fracture of L1 vertebra (HCC)   Bacteremia due to Enterococcus   Discitis    [name] is a 87 y.o. female with recent syncopal episode fall low back pain with MRI concerning for discitis.  In the interim her blood cultures have turned positive for Enterococcus faecalis and a transthoracic echocardiogram that orders have showed a large vegetation on the mitral valve.  She has not an operative candidate for cardiothoracic surgery.  Enterococcus faecalis endocarditis of mitral valve:  Continue dual beta-lactam therapy to complete 6 weeks of systemic antibiotics I will likely follow this with po antibiotics given her diskitis  I am concerned given the size of her vegetation that it may embolized to her central nervous system or kidneys.   Discitis: Undoubtably due to the same organism seen in her blood cultures  I spent greater than 35 minutes with the patient including greater than 50% of time in face to face counsel of the patient and in coordination of their care including making all of her appointments for a follow-up.   Diagnosis: Endocarditis  Culture Result: Enterococcus faecalis  Allergies  Allergen Reactions  . Actos  [Pioglitazone] Swelling    Ankle swelling  . Sulfonamide Derivatives Swelling  . Adhesive [Tape] Other (See Comments)    WITH PROLONGED EXPOSURE (REDNESS/ITCHING)  . Codeine Nausea And Vomiting    OPAT Orders Discharge antibiotics:  Ampicillin high dose and Ceftriaxone 2 grams IV q 12 hours  Duration:  6 weeks  End Date:  August 19, 2020  [facility] Care Per Protocol:   Labs  weekly while on IV antibiotics: x__ CBC with differential x__ BMP w GFR/CMP  -=_x- ESR, CRP  _x_ Please pull PIC at completion of IV antibiotics __ Please leave PIC in place until doctor has seen patient or been notified  Fax weekly labs to [phone]    [name] has an appointment on 07/24/2020 at 11am  and May 9th, 2022 at 945 AM both with Dr. Tommy Medal.  The [facility] is located in the [facility] at  [place] in [place].  [address], which is located to the left of the elevators.  Phone: [phone]  Fax: [phone]  [email or web address]   She should arrive 15 minutes prior to her appointment and SNF needs to know of both of them  I will have my partner Dr. Gale Journey follow-up on her blood cultures to make sure they are finalized and negative.  Otherwise I will sign off please call with further questions.   LOS: 9 days   Alcide Evener 07/13/2020, 12:45 PM

## 2020-07-13 NOTE — TOC Transition Note (Signed)
Transition of Care [facility]) - CM/SW Discharge Note   Patient Details  Name: [name] MRN: [id number] Date of Birth: 03/09/1933  Transition of Care [facility]) CM/SW Contact:  Milinda Antis, [place] Phone Number: 07/13/2020, 1:35 PM   Clinical Narrative:    Patient will DC to:  [facility]  Anticipated DC date:  07/13/2020 Family notified:  Yes, Early Chars. Megan Salon, spouse Transport by:  Corey Harold   Per MD patient ready for DC to SNF. RN to call report prior to discharge [phone]). RN, patient, patient's family, and facility notified of DC. Discharge Summary and FL2 sent to facility. DC packet on chart. Ambulance transport requested for patient.   CSW will sign off for now as social work intervention is no longer needed. Please consult [place] again if new needs arise.     Final next level of care: Skilled Nursing Facility Barriers to Discharge: Barriers Resolved   Patient Goals and CMS Choice Patient states their goals for this hospitalization and ongoing recovery are:: Rehab to return home CMS Medicare.gov Compare Post Acute Care list provided to:: Patient Choice offered to / list presented to : [facility]  Discharge Placement              Patient chooses bed at: [facility], [place] Patient to be transferred to facility by: [facility] Name of family member notified: [name] Patient and family notified of of transfer: 07/13/20  Discharge Plan and Services In-house Referral: Clinical Social Work   Post Acute Care Choice: [place]                               Social Determinants of Health (SDOH) Interventions     Readmission Risk Interventions No flowsheet data found.

## 2020-07-13 NOTE — Progress Notes (Signed)
Peripherally Inserted Central Catheter Placement  The IV Nurse has discussed with the patient and/or persons authorized to consent for the patient, the purpose of this procedure and the potential benefits and risks involved with this procedure.  The benefits include less needle sticks, lab draws from the catheter, and the patient may be discharged home with the catheter. Risks include, but not limited to, infection, bleeding, blood clot (thrombus formation), and puncture of an artery; nerve damage and irregular heartbeat and possibility to perform a PICC exchange if needed/ordered by physician.  Alternatives to this procedure were also discussed.  Bard Power PICC patient education guide, fact sheet on infection prevention and patient information card has been provided to patient /or left at bedside.    PICC Placement Documentation  PICC Single Lumen 07/13/20 PICC Right Brachial 33 cm 0 cm (Active)  Exposed Catheter (cm) 0 cm 07/13/20 1123  Site Assessment Clean;Dry;Intact 07/13/20 1123  Line Status Flushed;Saline locked;Blood return noted 07/13/20 1123  Dressing Type Transparent;Securing device 07/13/20 1123  Dressing Status Clean;Dry;Intact 07/13/20 1123  Antimicrobial disc in place? Yes 07/13/20 1123  Safety Lock Not Applicable 77/11/65 7903  Dressing Change Due 07/20/20 07/13/20 1123       Frances Maywood 07/13/2020, 11:26 AM

## 2020-07-13 NOTE — Progress Notes (Signed)
IP rehab admissions - I met with patient and she is now agreeable to SNF placement.  Attending MD did peer to peer but we still received a denial from insurance carrier for acute inpatient rehab.  I will sign off for CIR at this time with plan for SNF when medically ready.  [phone]

## 2020-07-13 NOTE — TOC Progression Note (Signed)
Transition of Care [facility]) - Progression Note    Patient Details  Name: [name] MRN: [id number] Date of Birth: 1932/05/06  [facility] CM/SW [place], LCSW Phone Number: 07/13/2020, 8:57 AM  Clinical Narrative:    CSW sent clinicals to insurance for review for SNF and requested COVID test.    Expected Discharge Plan: IP Rehab Facility Barriers to Discharge: Insurance Authorization,Continued Medical Work up  Expected Discharge Plan and Services Expected Discharge Plan: [place] In-house Referral: Clinical Social Work   Post Acute Care Choice: IP Rehab Living arrangements for the past 2 months: Single Family Home                                       Social Determinants of Health (SDOH) Interventions    Readmission Risk Interventions No flowsheet data found.

## 2020-07-13 NOTE — Progress Notes (Signed)
Occupational Therapy Treatment Patient Details Name: [name] MRN: [id number] DOB: 02-14-1933 Today's Date: 07/13/2020    History of present illness 87 y.o. female presented to ED 3/9 after fall and question of near syncope. CT lumbar spine reveals L1 acute compression fx. Admitted for treatment of possible syncope, L1 fx, Worsening of chronic transaminases and UTI. Pt reports numbness and tingling in bilateral LE R>L, bladder and bowel incontinence. TLSO for OOB Found to have mitral valv vegetation  PMH: chronic back pain secondary to lumbar spine OA, chronic ambulation dysfunction, CAD status post CABG, chronic A. fib on Eliquis, HLD, aortic regurgitation and mitral regurgitation,   OT comments  Pt progressing well towards OT goals. Pt premedicated prior to session and pain more controlled. Pt overall min guard for bed mobility, mobility to/from sink with RW and ADLs standing at sink > 6 minutes. Pt continues to have difficulty managing TLSO brace with Mod A overall required to don at start of session. Pt demonstrating improving carryover with slow, step by step instructions and teachback method. Pt only able to recall 2/3 back precautions (forgot no lifting). Noted insurance denial for CIR, so DC recs updated to SNF for postacute rehab.    Follow Up Recommendations  SNF;Supervision/Assistance - 24 hour (noted insurance denial for CIR)    Equipment Recommendations  3 in 1 bedside commode    Recommendations for Other Services      Precautions / Restrictions Precautions Precautions: Fall;Back Precaution Booklet Issued: Yes (comment) Precaution Comments: spinal brace for OOB donned in seated, educated RN to proper don and dof, RN able to complete independently Required Braces or Orthoses: Spinal Brace Spinal Brace: Thoracolumbosacral orthotic Restrictions Weight Bearing Restrictions: No       Mobility Bed Mobility Overal bed mobility: Needs Assistance Bed Mobility:  Rolling;Sidelying to Sit;Sit to Sidelying Rolling: Supervision Sidelying to sit: Supervision     Sit to sidelying: Min guard General bed mobility comments: Improved ability to complete log roll to get EOB. min guard to get LE back into bed, cues for log rolling into bed.    Transfers Overall transfer level: Needs assistance Equipment used: Rolling walker (2 wheeled) Transfers: Sit to/from Stand Sit to Stand: Min guard         General transfer comment: min guard with cues for hand placement, no physical assist needed to boost    Balance Overall balance assessment: Needs assistance;History of Falls Sitting-balance support: Feet supported;Single extremity supported;Bilateral upper extremity supported Sitting balance-Leahy Scale: Fair     Standing balance support: Bilateral upper extremity supported;During functional activity;Single extremity supported Standing balance-Leahy Scale: Fair Standing balance comment: able to statically stand without support during ADLs at sink                           ADL either performed or assessed with clinical judgement   ADL Overall ADL's : Needs assistance/impaired     Grooming: Min guard;Standing;Oral care Grooming Details (indicate cue type and reason): min guard for brushing teeth standing at sink, cues for avoiding bending too far forward and use of cup for rinse/spit. brushed hair sitting EOB at end of session           Upper Body Dressing Details (indicate cue type and reason): Mod A overall to don brace sitting EOB. slower, step by step education provided to improve carryover with pt able to verbalize sequencing of task with < 50% cues  Functional mobility during ADLs: Min guard;Rolling walker General ADL Comments: improving pain levels and mobility with RW though continues to require cues for back precautions and recall of precautions     Vision   Vision Assessment?: No apparent visual  deficits   Perception     Praxis      Cognition Arousal/Alertness: Awake/alert Behavior During Therapy: WFL for tasks assessed/performed;Anxious Overall Cognitive Status: Impaired/Different from baseline Area of Impairment: Following commands;Awareness;Problem solving;Memory;Attention                   Current Attention Level: Selective Memory: Decreased recall of precautions Following Commands: Follows one step commands consistently;Follows one step commands with increased time;Follows multi-step commands with increased time   Awareness: Emergent Problem Solving: Difficulty sequencing;Requires verbal cues;Slow processing General Comments: Pt with less anxiety today and improved pain. able to recall 2/3 back precautions, some cues needed to maintain back precautions with ADLs        Exercises     Shoulder Instructions       General Comments VSS on RA. Husband present during session and supportive. staff entering at end of session for placement of PICC line    Pertinent Vitals/ Pain       Pain Assessment: Faces Faces Pain Scale: Hurts little more Pain Location: R UE Pain Descriptors / Indicators: Grimacing Pain Intervention(s): Monitored during session;Premedicated before session  Home Living                                          Prior Functioning/Environment              Frequency  Min 2X/week        Progress Toward Goals  OT Goals(current goals can now be found in the care plan section)  Progress towards OT goals: Progressing toward goals  Acute Rehab OT Goals Patient Stated Goal: be able to do for myself, get stronger OT Goal Formulation: With patient/family Time For Goal Achievement: 07/19/20 Potential to Achieve Goals: Good ADL Goals Pt Will Perform Grooming: with set-up;standing Pt Will Perform Lower Body Bathing: with set-up;with adaptive equipment;sit to/from stand Pt Will Perform Lower Body Dressing: with  set-up;sit to/from stand;with adaptive equipment Pt Will Transfer to Toilet: with set-up;ambulating;bedside commode;regular height toilet Pt Will Perform Toileting - Clothing Manipulation and hygiene: with set-up;sit to/from stand Additional ADL Goal #1: Pt to verbalize 3/3 spinal precautions accurately with no cues in order to maximize safety/healing  Plan Discharge plan needs to be updated    Co-evaluation                 AM-PAC OT "6 Clicks" Daily Activity     Outcome Measure   Help from another person eating meals?: None Help from another person taking care of personal grooming?: A Little Help from another person toileting, which includes using toliet, bedpan, or urinal?: A Lot Help from another person bathing (including washing, rinsing, drying)?: A Little Help from another person to put on and taking off regular upper body clothing?: A Lot Help from another person to put on and taking off regular lower body clothing?: A Lot 6 Click Score: 16    End of Session Equipment Utilized During Treatment: Gait belt;Rolling walker;Back brace  OT Visit Diagnosis: Unsteadiness on feet (R26.81);Other abnormalities of gait and mobility (R26.89);Muscle weakness (generalized) (M62.81);History of falling (Z91.81);Pain   Activity Tolerance Patient tolerated treatment  well   Patient Left in bed;with call bell/phone within reach;with bed alarm set;with family/visitor present;Other (comment) (staff present for PICC line placement)   Nurse Communication Mobility status        Time: 8466-5993 OT Time Calculation (min): 41 min  Charges: OT General Charges $OT Visit: 1 Visit OT Treatments $Self Care/Home Management : 38-52 mins  Malachy Chamber, OTR/L Acute Rehab Services Office: [phone]   Layla Maw 07/13/2020, 12:38 PM

## 2020-07-13 NOTE — Progress Notes (Addendum)
ANTICOAGULATION CONSULT NOTE - Follow Up Consult  Pharmacy Consult for Apixaban Indication: Afib  Allergies  Allergen Reactions  . Actos [Pioglitazone] Swelling    Ankle swelling  . Sulfonamide Derivatives Swelling  . Adhesive [Tape] Other (See Comments)    WITH PROLONGED EXPOSURE (REDNESS/ITCHING)  . Codeine Nausea And Vomiting    Patient Measurements: Height: 5\' 4"  (162.6 cm) Weight: 43.4 kg (95 lb 10.9 oz) IBW/kg (Calculated) : 54.7  Vital Signs: Temp: 97.8 F (36.6 C) (03/18 0444) Temp Source: Oral (03/18 0444) BP: 140/51 (03/18 0444) Pulse Rate: 53 (03/18 0444)  Labs: Recent Labs    07/11/20 0059 07/12/20 0120 07/12/20 0715 07/13/20 0057  HGB 8.7* 9.4*  --  9.2*  HCT 25.2* 27.0*  --  26.2*  PLT 143* 162  --  186  CREATININE 1.18*  --  1.13* 1.05*  CKTOTAL 49  --   --   --     Estimated Creatinine Clearance: 26.8 mL/min (A) (by C-G formula based on SCr of 1.05 mg/dL (H)).    Assessment: 87 yo female presented on 07/04/2020 with fall, found to have progressive T12-L1 fractures and possible discitis. TTE found aortic valve vegitation and ceftriaxone and ampicillin were initiated. Patient on apixaban PTA for Afib which was held because of possible head bleed from fall and was resumed on 3/13 given no evidence of bleed and not a surgical candidate per CVTS. Age >80 , weight <60kg, Scr <1.5, so dose of 2.5 BID appropriate for Afib.     Goal of Therapy:  Therapeutic oral anticoagulation    Plan:  Continue apixaban 2.5mg  BID  Monitor hbg and platelets  Monitor for s/s bleeding  Pharmacy sign off  Horton Finer  Pharm-D Candidate 07/13/2020,11:26 AM

## 2020-07-13 NOTE — Progress Notes (Signed)
[name] to be D/C'd to SNF per MD order. After Visit Summary was printed and placed in the DC packet. Patient belongings and valuables sent with patient. Report given to [facility]. [name] J [name]  07/13/2020 6:06 PM

## 2020-07-13 NOTE — Discharge Summary (Signed)
PATIENT DETAILS Name: [name] Age: 87 y.o. Sex: female Date of Birth: 08/22/1932 MRN: [id number]. Admitting Physician: Lequita Halt, MD [id number], Alinda Sierras, MD  Admit Date: 07/04/2020 Discharge date: 07/13/2020  Recommendations for Outpatient Follow-up:  1. Follow up with PCP in 1-2 weeks 2. 6 weeks of IV antibiotics from 3/14 3. Please obtain CMP/CBC weekly while on IV antibiotics, fax results to infectious disease clinic. 4. Please ensure follow-up with ID 5. Consider evaluation by palliative care at SNF.  Admitted From:  Home  Disposition: SNF   Home Health: Yes  Equipment/Devices: None  Discharge Condition: Stable  CODE STATUS:  DNR  Diet recommendation:  Diet Order            Diet - low sodium heart healthy           Diet regular Room service appropriate? Yes; Fluid consistency: Thin  Diet effective now                  Brief Narrative: Patient is a 87 y.o. female chronic back pain, CAD s/p CABG, A. fib on Eliquis-presented following a fall-found to have T12-L1 fracture with possible discitis-further evaluation revealed Enterococcus bacteremia with aortic valve vegetation.  See below for further details.  Significant events: 3/9>> admit following fall-found to have L1 compression fracture  Significant studies: 3/9>> CT head: No acute abnormality 3/9>> CT abdomen/pelvis: L1 compression fracture 3/9>> MRI LS spine: T12-L1 fracture-retropulsion of bone into the canal without significant spinal stenosis 3/11>> Echo: EF 40-45%, RVSP 46.6, aortic valve vegetation 3/12>> CT maxillofacial: No evidence of acute maxillofacial abnormality.  Antimicrobial therapy: None  Microbiology data: 3/9>> urine culture: E. Coli 3/11>> blood culture: Enterococcus faecalis 3/12>> blood culture: Enterococcus faecalis 3/14>> blood culture: No growth  Procedures : None  Consults: Cardiology, ID, palliative care, cardiothoracic surgery,  neurosurgery   Brief Hospital Course: Enterococcus bacteremia with aortic valve vegetation-possible T12-L1 discitis:  Overall better-evaluated by infectious disease-plans are to continue dual beta-lactam therapy-for 6 weeks from 3/14.  Please ensure follow-up at the infectious disease clinic.  Please obtain weekly CBC/chemistry panel while on IV antibiotics and fax results to the ID clinic.  Fall at home: Probably due to orthostatic mechanism-continue supportive care.  T12-L1 compression fractures/possible discitis: Continue supportive care-neurosurgery recommended conservative management.  Hyponatremia: Mild-improving-suspect SIADH physiology given she is euvolemic.  Maintain fluid restriction.  AKI: Likely hemodynamically mediated-improving with supportive care  Chronic atrial fibrillation: No longer on amiodarone-Per cardiology-plans were to continue metoprolol-however she has had a few episodes of sinus pauses less than 3 seconds-and has mild bradycardia to the 50s over the past few days.  Discussed with cardiology-Dr. Owens Shark over the phone on 3/18-reasonable to hold beta-blocker on discharge.  Continue Eliquis.    Hypothyroidism: Continue Synthroid-repeat TSH in 4 to 6 weeks.  History of atrophic gastritis: Continue PPI/Carafate  Peripheral neuropathy: Continue Neurontin  Debility/physical deconditioning: Initially thought to be a CR candidate-however insurance has denied CIR-plans are now for SNF.  Palliative care/goals of care: DNR in place-palliative medicine was consulted during this hospital stay-Ensure outpatient follow-up with palliative care while at SNF.  Nutrition Problem: Nutrition Problem: Severe Malnutrition Etiology: chronic illness (chronic lower back pain 2/2 lumbar spine OA) Signs/Symptoms: energy intake < or equal to 75% for > or equal to 1 month,severe fat depletion,severe muscle depletion,percent weight loss Percent weight loss: 6  % Interventions: Ensure Enlive (each supplement provides 350kcal and 20 grams of protein),MVI,Magic cup  RN pressure injury documentation: Pressure Injury  07/05/20 Coccyx Mid;Bilateral Stage 1 -  Intact skin with non-blanchable redness of a localized area usually over a bony prominence. RED/NON BLANCHING (Active)  07/05/20 0352  Location: Coccyx  Location Orientation: Mid;Bilateral  Staging: Stage 1 -  Intact skin with non-blanchable redness of a localized area usually over a bony prominence.  Wound Description (Comments): RED/NON BLANCHING  Present on Admission: Yes    Discharge Diagnoses:  Principal Problem:   Endocarditis of mitral valve Active Problems:   Hypothyroidism   Hyperlipidemia   Cervical spondylosis   Persistent atrial fibrillation ([place])   Fall at home, initial encounter   Fall   AKI (acute kidney injury) ([place])   Transaminitis   Pressure injury of skin   Protein-calorie malnutrition, severe   Acute lower UTI   Compression fracture of L1 vertebra ([facility])   Bacteremia due to Enterococcus   Discitis   Discharge Instructions:  Activity:  As tolerated with Full fall precautions use walker/cane & assistance as needed  Discharge Instructions    Diet - low sodium heart healthy   Complete by: As directed    Increase activity slowly   Complete by: As directed    No dressing needed   Complete by: As directed      Allergies as of 07/13/2020      Reactions   Actos [pioglitazone] Swelling   Ankle swelling   Sulfonamide Derivatives Swelling   Adhesive [tape] Other (See Comments)   WITH PROLONGED EXPOSURE (REDNESS/ITCHING)   Codeine Nausea And Vomiting      Medication List    STOP taking these medications   amiodarone 100 MG tablet Commonly known as: PACERONE   losartan 100 MG tablet Commonly known as: COZAAR   spironolactone 25 MG tablet Commonly known as: ALDACTONE   traMADol 50 MG tablet Commonly known as: ULTRAM     TAKE these medications    acetaminophen 500 MG tablet Commonly known as: TYLENOL Take 2 tablets (1,000 mg total) by mouth every 8 (eight) hours as needed for mild pain. What changed:   how much to take  when to take this   ampicillin 2 g in sodium chloride 0.9 % 100 mL Inject 2 g into the vein every 8 (eight) hours. 6 weeks from 07/09/2020.   atorvastatin 80 MG tablet Commonly known as: LIPITOR Take 0.5 tablets (40 mg total) by mouth at bedtime.   CALTRATE 600+D PO Take 1 tablet by mouth daily with lunch.   cefTRIAXone 2 g in sodium chloride 0.9 % 100 mL Inject 2 g into the vein every 12 (twelve) hours. 6 weeks from 07/09/2020.   cyanocobalamin 1000 MCG/ML injection Commonly known as: (VITAMIN B-12) Inject 1,000 mcg into the muscle every 30 (thirty) days.   Eliquis 2.5 MG Tabs tablet Generic drug: apixaban TAKE 1 TABLET BY MOUTH TWICE DAILY What changed: how much to take   gabapentin 100 MG capsule Commonly known as: NEURONTIN Take 1 capsule (100 mg total) by mouth 2 (two) times daily. What changed:   when to take this  reasons to take this   hyoscyamine 0.125 MG SL tablet Commonly known as: LEVSIN SL Place 1 tablet (0.125 mg total) under the tongue every 6 (six) hours as needed. What changed:   when to take this  reasons to take this   ipratropium 0.03 % nasal spray Commonly known as: ATROVENT Place 2 sprays into both nostrils every 12 (twelve) hours.   lactase 3000 units tablet Commonly known as: LACTAID Take 3,000 Units by  mouth 3 (three) times daily with meals as needed (lactose Intolerance).   lactose free nutrition Liqd Take 237 mLs by mouth 3 (three) times daily with meals.   levothyroxine 50 MCG tablet Commonly known as: SYNTHROID Take 50 mcg by mouth at bedtime.   Lidocaine 4 % Oint Apply 1 application topically 3 (three) times daily as needed (pain). Roll-On (otc)   Lidocaine 4 % Ptch Place 1 patch onto the skin daily as needed (pain.).   oxyCODONE 5 MG immediate  release tablet Commonly known as: Oxy IR/ROXICODONE Take 1 tablet (5 mg total) by mouth every 6 (six) hours as needed for severe pain.   polyethylene glycol 17 g packet Commonly known as: MIRALAX / GLYCOLAX Take 17 g by mouth daily. Start taking on: July 14, 2020   PRESERVISION AREDS 2 PO Take 1 tablet by mouth 2 (two) times daily.   saccharomyces boulardii 250 MG capsule Commonly known as: FLORASTOR Take 250 mg by mouth as needed (digestion).   sodium chloride 0.65 % Soln nasal spray Commonly known as: OCEAN Place 1 spray into both nostrils as needed for congestion.   sucralfate 1 GM/10ML suspension Commonly known as: Carafate TAKE 10 MLS (1 G TOTAL) BY MOUTH 2 (TWO) TIMES DAILY. What changed:   how much to take  how to take this  when to take this  reasons to take this  additional instructions   SYSTANE FREE OP Place 1 drop into both eyes 3 (three) times daily as needed (dry/irritated eyes.).            Discharge Care Instructions  (From admission, onward)         Start     Ordered   07/13/20 0000  No dressing needed        07/13/20 1102          Contact information for follow-up providers    Burchette, Alinda Sierras, MD. Schedule an appointment as soon as possible for a visit in 1 month(s).   Specialty: Family Medicine Contact information: [place] [address] [phone]        Sherren Mocha, MD Follow up in 1 month(s).   Specialty: Cardiology Contact information: [address] [address] [phone]        Tommy Medal, Lavell Islam, MD Follow up.   Specialty: Infectious Diseases Why: office will call Contact information: [address] [address] [phone]            Contact information for after-discharge care    Destination    [facility] Preferred SNF .   Service: Skilled Nursing Contact information: [place] [phone]                 Allergies  Allergen Reactions  . Actos [Pioglitazone] Swelling    Ankle swelling  . Sulfonamide Derivatives Swelling  . Adhesive [Tape] Other (See Comments)    WITH PROLONGED EXPOSURE (REDNESS/ITCHING)  . Codeine Nausea And Vomiting        Other Procedures/Studies: DG Chest 2 View  Result Date: 07/04/2020 CLINICAL DATA:  Syncope EXAM: CHEST - 2 VIEW COMPARISON:  April 01, 2018 FINDINGS: There is an equivocal pleural effusion on each side with mild bibasilar atelectasis. No edema or airspace opacity. Heart is mildly enlarged with pulmonary vascularity normal. Patient is status post coronary artery bypass grafting. No adenopathy. There are foci of degenerative change in the thoracic  spine. IMPRESSION: Mild bibasilar atelectasis with equivocal pleural effusion on each side. No airspace opacity or edema evident. Heart mildly enlarged. Status post coronary artery bypass grafting. Electronically Signed   By: Lowella Grip III M.D.   On: 07/04/2020 08:57   DG Ankle Complete Left  Result Date: 07/04/2020 CLINICAL DATA:  Fall, left ankle pain EXAM: LEFT ANKLE COMPLETE - 3+ VIEW COMPARISON:  None. FINDINGS: Three view radiograph left ankle demonstrates normal alignment. No acute fracture or dislocation. Ankle mortise is intact. No ankle effusion. Small plantar calcaneal spur. Mild bimalleolar soft tissue swelling. Diffuse subcutaneous edema noted involving the visualized left foreleg and left forefoot. IMPRESSION: Soft tissue swelling.  No fracture or dislocation. Electronically Signed   By: Fidela Salisbury MD   On: 07/04/2020 06:48   CT HEAD WO CONTRAST  Result Date: 07/04/2020 CLINICAL DATA:  87 year old female with dizziness.  Fall at home. EXAM: CT HEAD WITHOUT CONTRAST TECHNIQUE: Contiguous axial images were obtained from the base of the skull through the vertex without intravenous contrast. COMPARISON:  [place]  Head CT  07/20/2019. FINDINGS: Brain: Stable cerebral volume from last year, generalized volume loss. No midline shift, ventriculomegaly, mass effect, evidence of mass lesion, intracranial hemorrhage or evidence of cortically based acute infarction. Gray-white matter differentiation is within normal limits throughout the brain. Dystrophic basal ganglia calcifications. No focal encephalomalacia identified. Vascular: Calcified atherosclerosis at the skull base. No suspicious intracranial vascular hyperdensity. Skull: Stable, intact. Sinuses/Orbits: Chronic left frontal sinus opacification with small osteoma along the posterior left frontoethmoidal recess, unchanged. Other Visualized paranasal sinuses and mastoids are clear. Other: No orbit or scalp soft tissue injury identified. IMPRESSION: 1. No acute intracranial abnormality or acute traumatic injury identified. Stable generalized cerebral volume loss. 2. Chronic left frontal sinus disease. Electronically Signed   By: Genevie Ann M.D.   On: 07/04/2020 07:19   MR BRAIN W WO CONTRAST  Result Date: 07/07/2020 CLINICAL DATA:  Dizziness.  Fall.  Aortic valve endocarditis. EXAM: MRI HEAD WITHOUT AND WITH CONTRAST TECHNIQUE: Multiplanar, multiecho pulse sequences of the brain and surrounding structures were obtained without and with intravenous contrast. CONTRAST:  57mL GADAVIST GADOBUTROL 1 MMOL/ML IV SOLN COMPARISON:  Head CT 07/04/2020 FINDINGS: Brain: There is no evidence of an acute infarct, mass, midline shift, or extra-axial fluid collection. There is moderately advanced cerebral atrophy which is greatest in the parietal lobes. A few scattered chronic cerebral and cerebellar microhemorrhages are noted and are nonspecific. Several small foci of T2 hyperintensity in the cerebral white matter are nonspecific and considered to be within normal limits for age. Vascular: Major intracranial vascular flow voids are preserved. Skull and upper cervical spine: 2.5 cm enhancing left  frontal skull lesion without clear CT correlate. Sinuses/Orbits: Bilateral cataract extraction. Complete opacification of the left frontal sinus. Mild bilateral ethmoid air cell mucosal thickening. Clear mastoid air cells. Other: None. IMPRESSION: 1. No acute intracranial abnormality. 2. Moderately advanced cerebral atrophy. 3. 2.5 cm enhancing left frontal skull lesion, nonspecific. Metastatic disease and multiple myeloma are not excluded. Electronically Signed   By: Logan Bores M.D.   On: 07/07/2020 18:34   MR LUMBAR SPINE WO CONTRAST  Result Date: 07/04/2020 CLINICAL DATA:  Fall at home. EXAM: MRI LUMBAR SPINE WITHOUT CONTRAST TECHNIQUE: Multiplanar, multisequence MR imaging of the lumbar spine was performed. No intravenous contrast was administered. COMPARISON:  CT abdomen pelvis 07/04/2020. MRI lumbar spine 01/26/2020 FINDINGS: Segmentation:  Normal Alignment: 5 mm retrolisthesis T12-L1 which has progressed since the prior MRI.  3 mm anterolisthesis L4-5 unchanged. Vertebrae: Progressive fracture superior endplate of L1, left greater than right. There is associated fracture of the inferior endplate of R10 right greater than left. There is bone marrow edema throughout the T12 and L1 vertebral bodies. There is endplate irregularity which has progressed from the prior MRI. Mild fluid lateral to the T12-L1 disc space on the right. Retropulsion of bone into the canal with mild spinal stenosis. No other fracture identified. Conus medullaris and cauda equina: Conus extends to the L1-2 level. Conus and cauda equina appear normal. Paraspinal and other soft tissues: Mild paravertebral edema on the right at T12-L1. No fluid collection or epidural abscess. Negative for paravertebral mass. Disc levels: T12-L1: Fracture of T12 and L1 as above with retropulsion of bone into the canal. No significant spinal stenosis. L1-2: Negative L2-3: Mild disc degeneration and mild facet degeneration. No significant spinal stenosis  L3-4: Disc degeneration with diffuse endplate spurring and bilateral facet degeneration. Mild to moderate subarticular stenosis bilaterally L4-5: 3 mm anterolisthesis. Diffuse disc bulging. Central and right-sided disc protrusion unchanged. Severe facet degeneration. Moderate to severe spinal stenosis. Severe subarticular and foraminal stenosis on the right. Moderate subarticular stenosis on the left L5-S1: Mild disc and facet degeneration.  Negative for stenosis. IMPRESSION: Moderate fractures of T12 and L1 as noted on CT earlier today. This has progressed since MRI of 01/26/2020. There is diffuse bone marrow edema throughout T12 and L1. There is mild paravertebral edema in the soft tissues on the right. There is retropulsion of bone into the canal without significant spinal stenosis. No epidural abscess however there is concern infection is present in the disc space. This could be an indolent fracture or possibly superimposed infection that has occurred since the MRI of 01/26/2020. Alternatively, it is possible this is a sterile process with progressive mechanical fracture T12-L1 Multilevel degenerative change. Moderate to severe spinal stenosis L4-5 with severe subarticular and foraminal stenosis on the right. This is unchanged from the earlier MRI. Electronically Signed   By: Franchot Gallo M.D.   On: 07/04/2020 14:49   CT ABDOMEN PELVIS W CONTRAST  Result Date: 07/04/2020 CLINICAL DATA:  syncopal episode this morning and fell in the bathroom. Now having right sided abdominal tenderness EXAM: CT ABDOMEN AND PELVIS WITH CONTRAST TECHNIQUE: Multidetector CT imaging of the abdomen and pelvis was performed using the standard protocol following bolus administration of intravenous contrast. CONTRAST:  26mL OMNIPAQUE IOHEXOL 300 MG/ML  SOLN COMPARISON:  CT abdomen and pelvis January 25, 2019. FINDINGS: Lower chest: Bibasilar atelectasis. Thoracic aortic atherosclerosis. Small hiatal hernia. Hepatobiliary: No  suspicious hepatic lesion. Gallbladder surgically absent. Similar prominence of the biliary tree, likely reservoir effect post cholecystectomy. Pancreas: Mild dorsal duct distension which is stable in comparison to prior studies. No focal pancreatic lesion. Spleen: No splenic injury or perisplenic hematoma. Adrenals/Urinary Tract: Bilateral adrenal glands are unremarkable. No hydronephrosis. Mild urothelial thickening at left-sided extrarenal pelvis. Nonobstructive 4 mm left lower pole renal stone. Bilateral low-density renal lesions are unchanged in comparison to prior and likely cysts. Urinary bladder is moderately distended without suspicious wall thickening. Stomach/Bowel: Stomach is predominantly decompressed limiting evaluation. No suspicious small bowel wall thickening or dilation. The appendix is not definitely visualized however there is no inflammatory stranding along the cecum to suggest acute inflammation. Moderate volume of formed stool throughout the colon. Sigmoid colonic diverticulosis without findings of acute diverticulitis. Vascular/Lymphatic: Aortic atherosclerosis. No enlarged abdominal or pelvic lymph nodes. Reproductive: Status post hysterectomy. No adnexal masses. Other: No  abdominopelvic ascites. Musculoskeletal: New anterior compression deformity of the L1 vertebral body with approximately 50% height loss and mild retropulsion of bone and disc into the spinal canal with narrowing of the canal. Global demineralization of bone. Multilevel degenerative changes spine. Degenerative changes bilateral hips. IMPRESSION: 1. New anterior compression deformity of the L1 vertebral body with approximately 50% height loss and mild retropulsion of bone and disc into the spinal canal with moderate narrowing of the canal at this level. 2. Apparent mild urothelial thickening of a left-sided extrarenal pelvis may reflect ascending urinary tract infection. Correlate with urinalysis. 3. Nonobstructive left  nephrolithiasis. 4. Sigmoid colonic diverticulosis without findings of acute diverticulitis. 5. Moderate volume of formed stool throughout the colon. 6. Aortic atherosclerosis.  Aortic Atherosclerosis (ICD10-I70.0). Electronically Signed   By: Dahlia Bailiff MD   On: 07/04/2020 10:04   ECHOCARDIOGRAM COMPLETE  Result Date: 07/07/2020    ECHOCARDIOGRAM REPORT   Patient Name:   [name] Date of Exam: 07/06/2020 Medical Rec #:  [id number]        Height:       64.0 in Accession #:    [id number]       Weight:       100.5 lb Date of Birth:  1933-02-07         BSA:          1.460 m Patient Age:    69 years         BP:           127/58 mmHg Patient Gender: F                HR:           95 bpm. Exam Location:  Inpatient Procedure: 2D Echo, Cardiac Doppler and Color Doppler            REPORT CONTAINS CRITICAL RESULT  Results communicated to Dr Cyndia Skeeters at 18:56 on 07/06/20. Indications:     R55 Syncope  History:         Patient has prior history of Echocardiogram examinations, most                  recent 04/03/2020. Prior CABG; Mitral Valve Disease.  Sonographer:     Merrie Roof RDCS Referring Phys:  [place] [facility] Diagnosing Phys: Oswaldo Milian MD IMPRESSIONS  1. Left ventricular ejection fraction, by estimation, is 40 to 45%. The left ventricle has mildly decreased function. The left ventricle demonstrates regional wall motion abnormalities. Abnormal (paradoxical) septal motion, consistent with left bundle branch block There is mild left ventricular hypertrophy. Left ventricular diastolic parameters are indeterminate.  2. Right ventricular systolic function is mildly reduced. The right ventricular size is normal. There is moderately elevated pulmonary artery systolic pressure. The estimated right ventricular systolic pressure is 78.4 mmHg.  3. Left atrial size was moderately dilated.  4. Right atrial size was mildly dilated.  5. The mitral valve is abnormal. Moderate to severe mitral valve  regurgitation.  6. There is an aortic valve vegetation measuring 1.5cm x 0.5cm. The aortic valve is tricuspid. No aortic stenosis is present. Increased gradients through AV likely due to AI, visually does not appear stenotic. Aortic valve regurgitation is moderate to severe. FINDINGS  Left Ventricle: Left ventricular ejection fraction, by estimation, is 40 to 45%. The left ventricle has mildly decreased function. The left ventricle demonstrates regional wall motion abnormalities. The left ventricular internal cavity size was normal in size. There is mild  left ventricular hypertrophy. Abnormal (paradoxical) septal motion, consistent with left bundle branch block. Left ventricular diastolic parameters are indeterminate. Right Ventricle: The right ventricular size is normal. Right vetricular wall thickness was not well visualized. Right ventricular systolic function is mildly reduced. There is moderately elevated pulmonary artery systolic pressure. The tricuspid regurgitant velocity is 3.30 m/s, and with an assumed right atrial pressure of 3 mmHg, the estimated right ventricular systolic pressure is 09.8 mmHg. Left Atrium: Left atrial size was moderately dilated. Right Atrium: Right atrial size was mildly dilated. Pericardium: Trivial pericardial effusion is present. Mitral Valve: The mitral valve is abnormal. Moderate to severe mitral valve regurgitation. Tricuspid Valve: The tricuspid valve is normal in structure. Tricuspid valve regurgitation is mild. Aortic Valve: Aortic valve vegetation measuring 1.5cm x 0.5 cm. The aortic valve is tricuspid. Aortic valve regurgitation is severe. Aortic regurgitation PHT measures 475 msec. No aortic stenosis is present. Aortic valve mean gradient measures 9.0 mmHg. Aortic valve peak gradient measures 17.0 mmHg. Aortic valve area, by VTI measures 0.84 cm. Pulmonic Valve: The pulmonic valve was grossly normal. Pulmonic valve regurgitation is not visualized. Aorta: The aortic root  is normal in size and structure. IAS/Shunts: The interatrial septum was not well visualized.  LEFT VENTRICLE PLAX 2D LVIDd:         3.80 cm  Diastology LVIDs:         2.70 cm  LV e' medial:  6.64 cm/s LV PW:         1.00 cm  LV e' lateral: 10.30 cm/s LV IVS:        1.00 cm LVOT diam:     1.70 cm LV SV:         32 LV SV Index:   22 LVOT Area:     2.27 cm  RIGHT VENTRICLE RV Basal diam:  3.40 cm LEFT ATRIUM             Index       RIGHT ATRIUM           Index LA diam:        3.10 cm 2.12 cm/m  RA Area:     16.90 cm LA Vol (A2C):   56.0 ml 38.34 ml/m RA Volume:   45.30 ml  31.02 ml/m LA Vol (A4C):   59.5 ml 40.74 ml/m LA Biplane Vol: 63.1 ml 43.21 ml/m  AORTIC VALVE AV Area (Vmax):    1.13 cm AV Area (Vmean):   0.95 cm AV Area (VTI):     0.84 cm AV Vmax:           206.00 cm/s AV Vmean:          140.000 cm/s AV VTI:            0.380 m AV Peak Grad:      17.0 mmHg AV Mean Grad:      9.0 mmHg LVOT Vmax:         103.00 cm/s LVOT Vmean:        58.500 cm/s LVOT VTI:          0.141 m LVOT/AV VTI ratio: 0.37 AI PHT:            475 msec  AORTA Ao Root diam: 2.70 cm Ao Asc diam:  2.40 cm MR Peak grad:    86.5 mmHg   TRICUSPID VALVE MR Mean grad:    46.0 mmHg   TR Peak grad:   43.6 mmHg MR Vmax:  465.00 cm/s TR Vmax:        330.00 cm/s MR Vmean:        307.0 cm/s MR PISA:         2.26 cm    SHUNTS MR PISA Eff ROA: 16 mm      Systemic VTI:  0.14 m MR PISA Radius:  0.60 cm     Systemic Diam: 1.70 cm Oswaldo Milian MD Electronically signed by Oswaldo Milian MD Signature Date/Time: 07/06/2020/7:15:52 PM    Final (Updated)    [place] EKG SITE RITE  Result Date: 07/12/2020 If Site Rite image not attached, placement could not be confirmed due to current cardiac rhythm.  CT MAXILLOFACIAL WO CONTRAST  Result Date: 07/07/2020 CLINICAL DATA:  Possible odontogenic source of bacteremia and discitis. EXAM: CT MAXILLOFACIAL WITHOUT CONTRAST TECHNIQUE: Multidetector CT imaging of the maxillofacial structures was  performed. Multiplanar CT image reconstructions were also generated. COMPARISON:  Head MRI 07/07/2020 and CT 07/04/2020 FINDINGS: Osseous: No fracture or destructive osseous process. Multiple prior dental extractions. No definite dental caries or periapical abscess. Orbits: Bilateral cataract extraction.  No acute finding. Sinuses: Chronic left frontal sinusitis with complete sinus opacification, also present in 06/2019. Minor bilateral ethmoid air cell mucosal thickening. Clear mastoid air cells. Soft tissues: Moderate right and mild left carotid bifurcation calcific atherosclerosis. Limited intracranial: More fully evaluated on today's head MRI. IMPRESSION: 1. No evidence of acute maxillofacial abnormality. No definite dental caries or periapical abscess. 2. Chronic left frontal sinusitis. Electronically Signed   By: Logan Bores M.D.   On: 07/07/2020 18:46     TODAY-DAY OF DISCHARGE:  Subjective:   [name] today has no headache,no chest abdominal pain,no new weakness tingling or numbness, feels much better wants to go home today.   Objective:   Blood pressure (!) 140/51, pulse (!) 53, temperature 97.8 F (36.6 C), temperature source Oral, resp. rate 18, height 5' 4"  (1.626 m), weight 43.4 kg, SpO2 92 %. No intake or output data in the 24 hours ending 07/13/20 1102 Filed Weights   07/04/20 0620 07/05/20 0200 07/09/20 0531  Weight: 40 kg 45.6 kg 43.4 kg    Exam: Awake Alert, Oriented *3, No new F.N deficits, Normal affect [place].AT,PERRAL Supple Neck,No JVD, No cervical lymphadenopathy appriciated.  Symmetrical Chest wall movement, Good air movement bilaterally, CTAB RRR,No Gallops,Rubs or new Murmurs, No Parasternal Heave +ve B.Sounds, Abd Soft, Non tender, No organomegaly appriciated, No rebound -guarding or rigidity. No Cyanosis, Clubbing or edema, No new Rash or bruise   PERTINENT RADIOLOGIC STUDIES: [place] EKG SITE RITE  Result Date: 07/12/2020 If Site Rite image not attached,  placement could not be confirmed due to current cardiac rhythm.    PERTINENT LAB RESULTS: CBC: Recent Labs    07/12/20 0120 07/13/20 0057  WBC 9.5 8.4  HGB 9.4* 9.2*  HCT 27.0* 26.2*  PLT 162 186   CMET CMP     Component Value Date/Time   NA 130 (L) 07/13/2020 0057   NA 139 05/23/2020 1431   K 4.3 07/13/2020 0057   CL 104 07/13/2020 0057   CO2 21 (L) 07/13/2020 0057   GLUCOSE 88 07/13/2020 0057   BUN 20 07/13/2020 0057   BUN 27 05/23/2020 1431   CREATININE 1.05 (H) 07/13/2020 0057   CALCIUM 7.6 (L) 07/13/2020 0057   PROT 5.0 (L) 07/13/2020 0057   PROT 6.9 06/20/2020 1234   ALBUMIN 1.9 (L) 07/13/2020 0057   ALBUMIN 3.8 06/20/2020 1234   AST 45 (H) 07/13/2020  0057   ALT 71 (H) 07/13/2020 0057   ALKPHOS 75 07/13/2020 0057   BILITOT 0.7 07/13/2020 0057   BILITOT 0.3 06/20/2020 1234   GFRNONAA 52 (L) 07/13/2020 0057   GFRAA 53 (L) 05/23/2020 1431    GFR Estimated Creatinine Clearance: 26.8 mL/min (A) (by C-G formula based on SCr of 1.05 mg/dL (H)). No results for input(s): LIPASE, AMYLASE in the last 72 hours. Recent Labs    07/11/20 0059  CKTOTAL 27   Invalid input(s): POCBNP No results for input(s): DDIMER in the last 72 hours. No results for input(s): HGBA1C in the last 72 hours. No results for input(s): CHOL, HDL, LDLCALC, TRIG, CHOLHDL, LDLDIRECT in the last 72 hours. No results for input(s): TSH, T4TOTAL, T3FREE, THYROIDAB in the last 72 hours.  Invalid input(s): FREET3 No results for input(s): VITAMINB12, FOLATE, FERRITIN, TIBC, IRON, RETICCTPCT in the last 72 hours. Coags: No results for input(s): INR in the last 72 hours.  Invalid input(s): PT Microbiology: Recent Results (from the past 240 hour(s))  Urine culture     Status: Abnormal   Collection Time: 07/04/20  8:09 AM   Specimen: Urine, Random  Result Value Ref Range Status   Specimen Description URINE, RANDOM  Final   Special Requests   Final    NONE Performed at [facility],  [address], [place], [place] [address]    Culture >=100,000 COLONIES/mL ESCHERICHIA COLI (A)  Final   Report Status 07/06/2020 FINAL  Final   Organism ID, Bacteria ESCHERICHIA COLI (A)  Final      Susceptibility   Escherichia coli - MIC*    AMPICILLIN 4 SENSITIVE Sensitive     CEFAZOLIN <=4 SENSITIVE Sensitive     CEFEPIME <=0.12 SENSITIVE Sensitive     CEFTRIAXONE <=0.25 SENSITIVE Sensitive     CIPROFLOXACIN <=0.25 SENSITIVE Sensitive     GENTAMICIN <=1 SENSITIVE Sensitive     IMIPENEM <=0.25 SENSITIVE Sensitive     NITROFURANTOIN <=16 SENSITIVE Sensitive     TRIMETH/SULFA >=320 RESISTANT Resistant     AMPICILLIN/SULBACTAM <=2 SENSITIVE Sensitive     PIP/TAZO <=4 SENSITIVE Sensitive     * >=100,000 COLONIES/mL ESCHERICHIA COLI  SARS CORONAVIRUS 2 (TAT 6-24 HRS) Nasopharyngeal Nasopharyngeal Swab     Status: None   Collection Time: 07/04/20 10:21 PM   Specimen: Nasopharyngeal Swab  Result Value Ref Range Status   SARS Coronavirus 2 NEGATIVE NEGATIVE Final    Comment: (NOTE) SARS-CoV-2 target nucleic acids are NOT DETECTED.  The SARS-CoV-2 RNA is generally detectable in upper and lower respiratory specimens during the acute phase of infection. Negative results do not preclude SARS-CoV-2 infection, do not rule out co-infections with other pathogens, and should not be used as the sole basis for treatment or other patient management decisions. Negative results must be combined with clinical observations, patient history, and epidemiological information. The expected result is Negative.  Fact Sheet for Patients: SugarRoll.be  Fact Sheet for Healthcare Providers: https://www.woods-mathews.com/  This test is not yet approved or cleared by the [place] FDA and  has been authorized for detection and/or diagnosis of SARS-CoV-2 by FDA under an Emergency Use Authorization (EUA). This EUA will remain  in effect (meaning this test can be  used) for the duration of the COVID-19 declaration under Se ction 564(b)(1) of the Act, 21 U.S.C. section 360bbb-3(b)(1), unless the authorization is terminated or revoked sooner.  Performed at [facility], [place] [address] [place], [place] [address]   Culture, blood (  routine x 2)     Status: Abnormal   Collection Time: 07/06/20 10:04 PM   Specimen: BLOOD  Result Value Ref Range Status   Specimen Description BLOOD LEFT ARM  Final   Special Requests   Final    BOTTLES DRAWN AEROBIC AND ANAEROBIC Blood Culture adequate volume   Culture  Setup Time   Final    GRAM POSITIVE COCCI IN CHAINS IN BOTH AEROBIC AND ANAEROBIC BOTTLES CRITICAL RESULT CALLED TO, READ BACK BY AND VERIFIED WITH: G. ABBOTT,PHARMD 0000 07/08/2020 T. TYSOR Performed at [facility], [place] [address] [place], [place] [address]    Culture ENTEROCOCCUS FAECALIS (A)  Final   Report Status 07/09/2020 FINAL  Final   Organism ID, Bacteria ENTEROCOCCUS FAECALIS  Final      Susceptibility   Enterococcus faecalis - MIC*    AMPICILLIN <=2 SENSITIVE Sensitive     VANCOMYCIN 1 SENSITIVE Sensitive     GENTAMICIN SYNERGY SENSITIVE Sensitive     * ENTEROCOCCUS FAECALIS  Blood Culture ID Panel (Reflexed)     Status: Abnormal   Collection Time: 07/06/20 10:04 PM  Result Value Ref Range Status   Enterococcus faecalis DETECTED (A) NOT DETECTED Final    Comment: CRITICAL RESULT CALLED TO, READ BACK BY AND VERIFIED WITH: G. ABBOTT,PHARMD 0000 07/08/2020 T. TYSOR    Enterococcus Faecium NOT DETECTED NOT DETECTED Final   Listeria monocytogenes NOT DETECTED NOT DETECTED Final   Staphylococcus species NOT DETECTED NOT DETECTED Final   Staphylococcus aureus (BCID) NOT DETECTED NOT DETECTED Final   Staphylococcus epidermidis NOT DETECTED NOT DETECTED Final   Staphylococcus lugdunensis NOT DETECTED NOT DETECTED Final   Streptococcus species NOT DETECTED NOT DETECTED Final   Streptococcus agalactiae NOT DETECTED NOT DETECTED  Final   Streptococcus pneumoniae NOT DETECTED NOT DETECTED Final   Streptococcus pyogenes NOT DETECTED NOT DETECTED Final   A.calcoaceticus-baumannii NOT DETECTED NOT DETECTED Final   Bacteroides fragilis NOT DETECTED NOT DETECTED Final   Enterobacterales NOT DETECTED NOT DETECTED Final   Enterobacter cloacae complex NOT DETECTED NOT DETECTED Final   Escherichia coli NOT DETECTED NOT DETECTED Final   Klebsiella aerogenes NOT DETECTED NOT DETECTED Final   Klebsiella oxytoca NOT DETECTED NOT DETECTED Final   Klebsiella pneumoniae NOT DETECTED NOT DETECTED Final   Proteus species NOT DETECTED NOT DETECTED Final   Salmonella species NOT DETECTED NOT DETECTED Final   Serratia marcescens NOT DETECTED NOT DETECTED Final   Haemophilus influenzae NOT DETECTED NOT DETECTED Final   Neisseria meningitidis NOT DETECTED NOT DETECTED Final   Pseudomonas aeruginosa NOT DETECTED NOT DETECTED Final   Stenotrophomonas maltophilia NOT DETECTED NOT DETECTED Final   Candida albicans NOT DETECTED NOT DETECTED Final   Candida auris NOT DETECTED NOT DETECTED Final   Candida glabrata NOT DETECTED NOT DETECTED Final   Candida krusei NOT DETECTED NOT DETECTED Final   Candida parapsilosis NOT DETECTED NOT DETECTED Final   Candida tropicalis NOT DETECTED NOT DETECTED Final   Cryptococcus neoformans/gattii NOT DETECTED NOT DETECTED Final   Vancomycin resistance NOT DETECTED NOT DETECTED Final    Comment: Performed at [facility], [place]. [address], [place], [place] [address]  Culture, blood (routine x 2)     Status: Abnormal   Collection Time: 07/06/20 10:12 PM   Specimen: BLOOD  Result Value Ref Range Status   Specimen Description BLOOD RIGHT ARM  Final   Special Requests   Final    BOTTLES DRAWN AEROBIC ONLY Blood Culture adequate volume  Culture  Setup Time   Final    GRAM POSITIVE COCCI AEROBIC BOTTLE ONLY CRITICAL VALUE NOTED.  VALUE IS CONSISTENT WITH PREVIOUSLY REPORTED AND CALLED VALUE.     Culture (A)  Final    ENTEROCOCCUS FAECALIS SUSCEPTIBILITIES PERFORMED ON PREVIOUS CULTURE WITHIN THE LAST 5 DAYS. Performed at [facility], [place] [address], [place], [place] [address]    Report Status 07/09/2020 FINAL  Final  Culture, blood (routine x 2)     Status: Abnormal   Collection Time: 07/07/20  8:00 AM   Specimen: BLOOD  Result Value Ref Range Status   Specimen Description BLOOD LEFT ANTECUBITAL  Final   Special Requests   Final    BOTTLES DRAWN AEROBIC ONLY Blood Culture adequate volume   Culture  Setup Time   Final    GRAM POSITIVE COCCI IN CHAINS AEROBIC BOTTLE ONLY CRITICAL VALUE NOTED.  VALUE IS CONSISTENT WITH PREVIOUSLY REPORTED AND CALLED VALUE.    Culture (A)  Final    ENTEROCOCCUS FAECALIS SUSCEPTIBILITIES PERFORMED ON PREVIOUS CULTURE WITHIN THE LAST 5 DAYS. Performed at [facility], [place] [address] [place], [place] [address]    Report Status 07/09/2020 FINAL  Final  Culture, blood (routine x 2)     Status: Abnormal   Collection Time: 07/07/20  8:15 AM   Specimen: BLOOD  Result Value Ref Range Status   Specimen Description BLOOD RIGHT ANTECUBITAL  Final   Special Requests   Final    BOTTLES DRAWN AEROBIC ONLY Blood Culture adequate volume   Culture  Setup Time   Final    GRAM POSITIVE COCCI IN CHAINS AEROBIC BOTTLE ONLY CRITICAL VALUE NOTED.  VALUE IS CONSISTENT WITH PREVIOUSLY REPORTED AND CALLED VALUE.    Culture (A)  Final    ENTEROCOCCUS FAECALIS SUSCEPTIBILITIES PERFORMED ON PREVIOUS CULTURE WITHIN THE LAST 5 DAYS. Performed at [facility], [place] [address], [place], [place] [address]    Report Status 07/09/2020 FINAL  Final  Culture, blood (routine x 2)     Status: None (Preliminary result)   Collection Time: 07/09/20  1:03 AM   Specimen: BLOOD  Result Value Ref Range Status   Specimen Description BLOOD RIGHT HAND  Final   Special Requests   Final    BOTTLES DRAWN AEROBIC AND ANAEROBIC Blood Culture adequate volume    Culture   Final    NO GROWTH 4 DAYS Performed at [facility], [place] [address], [place], [place] [address]    Report Status PENDING  Incomplete  Culture, blood (routine x 2)     Status: None (Preliminary result)   Collection Time: 07/09/20  2:55 AM   Specimen: BLOOD  Result Value Ref Range Status   Specimen Description BLOOD RIGHT ARM  Final   Special Requests   Final    BOTTLES DRAWN AEROBIC AND ANAEROBIC Blood Culture adequate volume   Culture   Final    NO GROWTH 4 DAYS Performed at [facility], [place] [address], [place], [place] [address]    Report Status PENDING  Incomplete    FURTHER DISCHARGE INSTRUCTIONS:  Get Medicines reviewed and adjusted: Please take all your medications with you for your next visit with your Primary MD  Laboratory/radiological data: Please request your Primary MD to go over all hospital tests and procedure/radiological results at the follow up, please ask your Primary MD to get all Hospital records sent to his/her office.  In some cases, they will be  blood work, cultures and biopsy results pending at the time of your discharge. Please request that your primary care M.D. goes through all the records of your hospital data and follows up on these results.  Also Note the following: If you experience worsening of your admission symptoms, develop shortness of breath, life threatening emergency, suicidal or homicidal thoughts you must seek medical attention immediately by calling 911 or calling your MD immediately  if symptoms less severe.  You must read complete instructions/literature along with all the possible adverse reactions/side effects for all the Medicines you take and that have been prescribed to you. Take any new Medicines after you have completely understood and accpet all the possible adverse reactions/side effects.   Do not drive when taking Pain medications or sleeping medications (Benzodaizepines)  Do not take more than prescribed  Pain, Sleep and Anxiety Medications. It is not advisable to combine anxiety,sleep and pain medications without talking with your primary care practitioner  Special Instructions: If you have smoked or chewed Tobacco  in the last 2 yrs please stop smoking, stop any regular Alcohol  and or any Recreational drug use.  Wear Seat belts while driving.  Please note: You were cared for by a hospitalist during your hospital stay. Once you are discharged, your primary care physician will handle any further medical issues. Please note that NO REFILLS for any discharge medications will be authorized once you are discharged, as it is imperative that you return to your primary care physician (or establish a relationship with a primary care physician if you do not have one) for your post hospital discharge needs so that they can reassess your need for medications and monitor your lab values.  Total Time spent coordinating discharge including counseling, education and face to face time equals 35 minutes.  SignedOren Binet 07/13/2020 11:02 AM

## 2020-07-13 NOTE — Progress Notes (Signed)
Physical Therapy Treatment Patient Details Name: [name] MRN: [id number] DOB: [birth year] Today's Date: 07/13/2020    History of Present Illness 87 y.o. female presented to ED 3/9 after fall and question of near syncope. CT lumbar spine reveals L1 acute compression fx. Admitted for treatment of possible syncope, L1 fx, Worsening of chronic transaminases and UTI. Pt reports numbness and tingling in bilateral LE R>L, bladder and bowel incontinence. TLSO for OOB Found to have mitral valv vegetation  PMH: chronic back pain secondary to lumbar spine OA, chronic ambulation dysfunction, CAD status post CABG, chronic A. fib on Eliquis, HLD, aortic regurgitation and mitral regurgitation,    PT Comments    Pt making steady progress with mobility. Plan is to go to SNF for further therapy before return home with husband.    Follow Up Recommendations  SNF (insurance denied CIR)     Equipment Recommendations  None recommended by PT    Recommendations for Other Services       Precautions / Restrictions Precautions Precautions: Fall;Back Precaution Booklet Issued: Yes (comment) Precaution Comments: spinal brace for OOB donned in seated, educated RN to proper don and dof, RN able to complete independently Required Braces or Orthoses: Spinal Brace Spinal Brace: Thoracolumbosacral orthotic Restrictions Weight Bearing Restrictions: No    Mobility  Bed Mobility Overal bed mobility: Needs Assistance Bed Mobility: Rolling;Supine to Sit Rolling: Supervision Sidelying to sit: Supervision     Sit to sidelying: Min guard General bed mobility comments: Supervision for cues for technique.    Transfers Overall transfer level: Needs assistance Equipment used: 4-wheeled walker Transfers: Sit to/from Stand Sit to Stand: Min guard         General transfer comment: Assist to for safety and lines  Ambulation/Gait Ambulation/Gait assistance: Min assist Gait Distance (Feet): 90  Feet Assistive device: 4-wheeled walker Gait Pattern/deviations: Step-through pattern;Trunk flexed;Decreased stride length;Narrow base of support Gait velocity: decr Gait velocity interpretation: <1.31 ft/sec, indicative of household ambulator General Gait Details: Assist for balance and stability   Stairs             Wheelchair Mobility    Modified Rankin (Stroke Patients Only)       Balance Overall balance assessment: Needs assistance;History of Falls Sitting-balance support: Feet supported;No upper extremity supported Sitting balance-Leahy Scale: Fair     Standing balance support: Bilateral upper extremity supported;During functional activity Standing balance-Leahy Scale: Poor Standing balance comment: rollator and min guard for static standing                            Cognition Arousal/Alertness: Awake/alert Behavior During Therapy: WFL for tasks assessed/performed;Anxious Overall Cognitive Status: Within Functional Limits for tasks assessed Area of Impairment: Following commands;Awareness;Problem solving;Memory;Attention                   Current Attention Level: Selective Memory: Decreased recall of precautions Following Commands: Follows one step commands consistently;Follows one step commands with increased time;Follows multi-step commands with increased time   Awareness: Emergent Problem Solving: Difficulty sequencing;Requires verbal cues;Slow processing General Comments: Pt with less anxiety today and improved pain. able to recall 2/3 back precautions, some cues needed to maintain back precautions with ADLs      Exercises      General Comments General comments (skin integrity, edema, etc.): VSS on RA. Husband present      Pertinent Vitals/Pain Pain Assessment: Faces Faces Pain Scale: Hurts a little bit Pain Location: back with  movement Pain Descriptors / Indicators: Guarding Pain Intervention(s): Limited activity within  patient's tolerance    Home Living                      Prior Function            PT Goals (current goals can now be found in the care plan section) Acute Rehab PT Goals Patient Stated Goal: be able to do for myself, get stronger PT Goal Formulation: With patient/family Time For Goal Achievement: 07/20/20 Potential to Achieve Goals: Good Progress towards PT goals: Progressing toward goals;Goals met and updated - see care plan    Frequency    Min 3X/week      PT Plan Discharge plan needs to be updated    Co-evaluation              AM-PAC PT "6 Clicks" Mobility   Outcome Measure  Help needed turning from your back to your side while in a flat bed without using bedrails?: A Little Help needed moving from lying on your back to sitting on the side of a flat bed without using bedrails?: A Little Help needed moving to and from a bed to a chair (including a wheelchair)?: A Little Help needed standing up from a chair using your arms (e.g., wheelchair or bedside chair)?: A Little Help needed to walk in hospital room?: A Little Help needed climbing 3-5 steps with a railing? : Total 6 Click Score: 16    End of Session Equipment Utilized During Treatment: Back brace;Gait belt Activity Tolerance: Patient tolerated treatment well Patient left: in chair;with call bell/phone within reach;with family/visitor present Nurse Communication: Mobility status PT Visit Diagnosis: Unsteadiness on feet (R26.81);Other abnormalities of gait and mobility (R26.89);Muscle weakness (generalized) (M62.81);Difficulty in walking, not elsewhere classified (R26.2);Other symptoms and signs involving the nervous system (R29.898);Pain     Time: 1143-1200 PT Time Calculation (min) (ACUTE ONLY): 17 min  Charges:  $Gait Training: 8-22 mins                     Leland Pager [phone] Office [place] 07/13/2020, 1:22 PM

## 2020-07-14 DIAGNOSIS — Z79899 Other long term (current) drug therapy: Secondary | ICD-10-CM | POA: Diagnosis not present

## 2020-07-14 DIAGNOSIS — R2681 Unsteadiness on feet: Secondary | ICD-10-CM | POA: Diagnosis not present

## 2020-07-14 DIAGNOSIS — I358 Other nonrheumatic aortic valve disorders: Secondary | ICD-10-CM | POA: Diagnosis not present

## 2020-07-14 DIAGNOSIS — I4891 Unspecified atrial fibrillation: Secondary | ICD-10-CM | POA: Diagnosis not present

## 2020-07-14 LAB — CULTURE, BLOOD (ROUTINE X 2)
Culture: NO GROWTH
Culture: NO GROWTH
Special Requests: ADEQUATE
Special Requests: ADEQUATE

## 2020-07-18 DIAGNOSIS — L89620 Pressure ulcer of left heel, unstageable: Secondary | ICD-10-CM | POA: Diagnosis not present

## 2020-07-18 DIAGNOSIS — I10 Essential (primary) hypertension: Secondary | ICD-10-CM | POA: Diagnosis not present

## 2020-07-18 DIAGNOSIS — I5020 Unspecified systolic (congestive) heart failure: Secondary | ICD-10-CM | POA: Diagnosis not present

## 2020-07-18 DIAGNOSIS — L89610 Pressure ulcer of right heel, unstageable: Secondary | ICD-10-CM | POA: Diagnosis not present

## 2020-07-20 DIAGNOSIS — Z79899 Other long term (current) drug therapy: Secondary | ICD-10-CM | POA: Diagnosis not present

## 2020-07-22 ENCOUNTER — Inpatient Hospital Stay (HOSPITAL_COMMUNITY)
Admission: EM | Admit: 2020-07-22 | Discharge: 2020-07-28 | DRG: 291 | Disposition: A | Discharge status: Skilled Nursing Facility | Payer: Medicare Other | Attending: Internal Medicine | Admitting: Internal Medicine

## 2020-07-22 ENCOUNTER — Encounter (HOSPITAL_COMMUNITY): Payer: Self-pay | Admitting: Internal Medicine

## 2020-07-22 ENCOUNTER — Emergency Department (HOSPITAL_COMMUNITY): Payer: Medicare Other

## 2020-07-22 ENCOUNTER — Other Ambulatory Visit: Payer: Self-pay

## 2020-07-22 DIAGNOSIS — Z885 Allergy status to narcotic agent status: Secondary | ICD-10-CM

## 2020-07-22 DIAGNOSIS — I447 Left bundle-branch block, unspecified: Secondary | ICD-10-CM | POA: Diagnosis not present

## 2020-07-22 DIAGNOSIS — K52832 Lymphocytic colitis: Secondary | ICD-10-CM | POA: Diagnosis not present

## 2020-07-22 DIAGNOSIS — D631 Anemia in chronic kidney disease: Secondary | ICD-10-CM | POA: Diagnosis not present

## 2020-07-22 DIAGNOSIS — E871 Hypo-osmolality and hyponatremia: Secondary | ICD-10-CM | POA: Diagnosis not present

## 2020-07-22 DIAGNOSIS — Z90710 Acquired absence of both cervix and uterus: Secondary | ICD-10-CM

## 2020-07-22 DIAGNOSIS — M4646 Discitis, unspecified, lumbar region: Secondary | ICD-10-CM | POA: Diagnosis not present

## 2020-07-22 DIAGNOSIS — I248 Other forms of acute ischemic heart disease: Secondary | ICD-10-CM | POA: Diagnosis not present

## 2020-07-22 DIAGNOSIS — J9601 Acute respiratory failure with hypoxia: Secondary | ICD-10-CM | POA: Diagnosis not present

## 2020-07-22 DIAGNOSIS — Z79899 Other long term (current) drug therapy: Secondary | ICD-10-CM

## 2020-07-22 DIAGNOSIS — M6281 Muscle weakness (generalized): Secondary | ICD-10-CM | POA: Diagnosis not present

## 2020-07-22 DIAGNOSIS — R609 Edema, unspecified: Secondary | ICD-10-CM | POA: Diagnosis not present

## 2020-07-22 DIAGNOSIS — R001 Bradycardia, unspecified: Secondary | ICD-10-CM | POA: Diagnosis present

## 2020-07-22 DIAGNOSIS — Z79890 Hormone replacement therapy (postmenopausal): Secondary | ICD-10-CM

## 2020-07-22 DIAGNOSIS — E739 Lactose intolerance, unspecified: Secondary | ICD-10-CM | POA: Diagnosis present

## 2020-07-22 DIAGNOSIS — Z20822 Contact with and (suspected) exposure to covid-19: Secondary | ICD-10-CM | POA: Diagnosis not present

## 2020-07-22 DIAGNOSIS — Z66 Do not resuscitate: Secondary | ICD-10-CM | POA: Diagnosis not present

## 2020-07-22 DIAGNOSIS — R5381 Other malaise: Secondary | ICD-10-CM | POA: Diagnosis present

## 2020-07-22 DIAGNOSIS — M4645 Discitis, unspecified, thoracolumbar region: Secondary | ICD-10-CM

## 2020-07-22 DIAGNOSIS — I25810 Atherosclerosis of coronary artery bypass graft(s) without angina pectoris: Secondary | ICD-10-CM | POA: Diagnosis not present

## 2020-07-22 DIAGNOSIS — Z681 Body mass index (BMI) 19 or less, adult: Secondary | ICD-10-CM | POA: Diagnosis not present

## 2020-07-22 DIAGNOSIS — R601 Generalized edema: Secondary | ICD-10-CM | POA: Diagnosis not present

## 2020-07-22 DIAGNOSIS — Z951 Presence of aortocoronary bypass graft: Secondary | ICD-10-CM

## 2020-07-22 DIAGNOSIS — M4640 Discitis, unspecified, site unspecified: Secondary | ICD-10-CM | POA: Diagnosis present

## 2020-07-22 DIAGNOSIS — E43 Unspecified severe protein-calorie malnutrition: Secondary | ICD-10-CM | POA: Diagnosis not present

## 2020-07-22 DIAGNOSIS — I5021 Acute systolic (congestive) heart failure: Secondary | ICD-10-CM | POA: Diagnosis not present

## 2020-07-22 DIAGNOSIS — Z7401 Bed confinement status: Secondary | ICD-10-CM | POA: Diagnosis not present

## 2020-07-22 DIAGNOSIS — R7881 Bacteremia: Secondary | ICD-10-CM | POA: Diagnosis present

## 2020-07-22 DIAGNOSIS — I509 Heart failure, unspecified: Secondary | ICD-10-CM

## 2020-07-22 DIAGNOSIS — L89151 Pressure ulcer of sacral region, stage 1: Secondary | ICD-10-CM | POA: Diagnosis present

## 2020-07-22 DIAGNOSIS — N179 Acute kidney failure, unspecified: Secondary | ICD-10-CM | POA: Diagnosis not present

## 2020-07-22 DIAGNOSIS — I4819 Other persistent atrial fibrillation: Secondary | ICD-10-CM | POA: Diagnosis not present

## 2020-07-22 DIAGNOSIS — E039 Hypothyroidism, unspecified: Secondary | ICD-10-CM | POA: Diagnosis present

## 2020-07-22 DIAGNOSIS — I5043 Acute on chronic combined systolic (congestive) and diastolic (congestive) heart failure: Secondary | ICD-10-CM | POA: Diagnosis not present

## 2020-07-22 DIAGNOSIS — R0902 Hypoxemia: Secondary | ICD-10-CM | POA: Diagnosis not present

## 2020-07-22 DIAGNOSIS — I5041 Acute combined systolic (congestive) and diastolic (congestive) heart failure: Secondary | ICD-10-CM | POA: Diagnosis not present

## 2020-07-22 DIAGNOSIS — R6889 Other general symptoms and signs: Secondary | ICD-10-CM | POA: Diagnosis not present

## 2020-07-22 DIAGNOSIS — E876 Hypokalemia: Secondary | ICD-10-CM | POA: Diagnosis not present

## 2020-07-22 DIAGNOSIS — N1831 Chronic kidney disease, stage 3a: Secondary | ICD-10-CM | POA: Diagnosis present

## 2020-07-22 DIAGNOSIS — M5136 Other intervertebral disc degeneration, lumbar region: Secondary | ICD-10-CM | POA: Diagnosis not present

## 2020-07-22 DIAGNOSIS — R778 Other specified abnormalities of plasma proteins: Secondary | ICD-10-CM | POA: Diagnosis not present

## 2020-07-22 DIAGNOSIS — Z801 Family history of malignant neoplasm of trachea, bronchus and lung: Secondary | ICD-10-CM

## 2020-07-22 DIAGNOSIS — Z882 Allergy status to sulfonamides status: Secondary | ICD-10-CM

## 2020-07-22 DIAGNOSIS — Z9049 Acquired absence of other specified parts of digestive tract: Secondary | ICD-10-CM

## 2020-07-22 DIAGNOSIS — I130 Hypertensive heart and chronic kidney disease with heart failure and stage 1 through stage 4 chronic kidney disease, or unspecified chronic kidney disease: Principal | ICD-10-CM | POA: Diagnosis present

## 2020-07-22 DIAGNOSIS — I2510 Atherosclerotic heart disease of native coronary artery without angina pectoris: Secondary | ICD-10-CM | POA: Diagnosis not present

## 2020-07-22 DIAGNOSIS — Z888 Allergy status to other drugs, medicaments and biological substances status: Secondary | ICD-10-CM

## 2020-07-22 DIAGNOSIS — I358 Other nonrheumatic aortic valve disorders: Secondary | ICD-10-CM | POA: Diagnosis present

## 2020-07-22 DIAGNOSIS — S32010D Wedge compression fracture of first lumbar vertebra, subsequent encounter for fracture with routine healing: Secondary | ICD-10-CM | POA: Diagnosis not present

## 2020-07-22 DIAGNOSIS — Z803 Family history of malignant neoplasm of breast: Secondary | ICD-10-CM

## 2020-07-22 DIAGNOSIS — I110 Hypertensive heart disease with heart failure: Secondary | ICD-10-CM | POA: Diagnosis not present

## 2020-07-22 DIAGNOSIS — Z8249 Family history of ischemic heart disease and other diseases of the circulatory system: Secondary | ICD-10-CM

## 2020-07-22 DIAGNOSIS — E785 Hyperlipidemia, unspecified: Secondary | ICD-10-CM | POA: Diagnosis present

## 2020-07-22 DIAGNOSIS — G8929 Other chronic pain: Secondary | ICD-10-CM | POA: Diagnosis present

## 2020-07-22 DIAGNOSIS — T502X5A Adverse effect of carbonic-anhydrase inhibitors, benzothiadiazides and other diuretics, initial encounter: Secondary | ICD-10-CM | POA: Diagnosis not present

## 2020-07-22 DIAGNOSIS — Y92239 Unspecified place in hospital as the place of occurrence of the external cause: Secondary | ICD-10-CM | POA: Diagnosis not present

## 2020-07-22 DIAGNOSIS — I10 Essential (primary) hypertension: Secondary | ICD-10-CM | POA: Diagnosis present

## 2020-07-22 DIAGNOSIS — I517 Cardiomegaly: Secondary | ICD-10-CM | POA: Diagnosis not present

## 2020-07-22 DIAGNOSIS — B952 Enterococcus as the cause of diseases classified elsewhere: Secondary | ICD-10-CM | POA: Diagnosis not present

## 2020-07-22 DIAGNOSIS — Z8042 Family history of malignant neoplasm of prostate: Secondary | ICD-10-CM

## 2020-07-22 DIAGNOSIS — M2550 Pain in unspecified joint: Secondary | ICD-10-CM | POA: Diagnosis not present

## 2020-07-22 DIAGNOSIS — Z800 Family history of malignant neoplasm of digestive organs: Secondary | ICD-10-CM

## 2020-07-22 DIAGNOSIS — I455 Other specified heart block: Secondary | ICD-10-CM | POA: Diagnosis not present

## 2020-07-22 DIAGNOSIS — I442 Atrioventricular block, complete: Secondary | ICD-10-CM | POA: Diagnosis not present

## 2020-07-22 DIAGNOSIS — R0602 Shortness of breath: Secondary | ICD-10-CM | POA: Diagnosis not present

## 2020-07-22 DIAGNOSIS — I480 Paroxysmal atrial fibrillation: Secondary | ICD-10-CM | POA: Diagnosis not present

## 2020-07-22 DIAGNOSIS — D638 Anemia in other chronic diseases classified elsewhere: Secondary | ICD-10-CM | POA: Diagnosis not present

## 2020-07-22 DIAGNOSIS — S22080D Wedge compression fracture of T11-T12 vertebra, subsequent encounter for fracture with routine healing: Secondary | ICD-10-CM | POA: Diagnosis not present

## 2020-07-22 DIAGNOSIS — I440 Atrioventricular block, first degree: Secondary | ICD-10-CM | POA: Diagnosis present

## 2020-07-22 DIAGNOSIS — I5031 Acute diastolic (congestive) heart failure: Secondary | ICD-10-CM | POA: Diagnosis not present

## 2020-07-22 DIAGNOSIS — R278 Other lack of coordination: Secondary | ICD-10-CM | POA: Diagnosis not present

## 2020-07-22 DIAGNOSIS — J90 Pleural effusion, not elsewhere classified: Secondary | ICD-10-CM | POA: Diagnosis not present

## 2020-07-22 DIAGNOSIS — M4644 Discitis, unspecified, thoracic region: Secondary | ICD-10-CM | POA: Diagnosis not present

## 2020-07-22 DIAGNOSIS — Z743 Need for continuous supervision: Secondary | ICD-10-CM | POA: Diagnosis not present

## 2020-07-22 DIAGNOSIS — Z7901 Long term (current) use of anticoagulants: Secondary | ICD-10-CM

## 2020-07-22 DIAGNOSIS — M545 Low back pain, unspecified: Secondary | ICD-10-CM | POA: Diagnosis not present

## 2020-07-22 DIAGNOSIS — Z833 Family history of diabetes mellitus: Secondary | ICD-10-CM

## 2020-07-22 DIAGNOSIS — N189 Chronic kidney disease, unspecified: Secondary | ICD-10-CM | POA: Diagnosis not present

## 2020-07-22 LAB — BRAIN NATRIURETIC PEPTIDE: B Natriuretic Peptide: 640.7 pg/mL — ABNORMAL HIGH (ref 0.0–100.0)

## 2020-07-22 LAB — URINALYSIS, ROUTINE W REFLEX MICROSCOPIC
Bilirubin Urine: NEGATIVE
Glucose, UA: NEGATIVE mg/dL
Hgb urine dipstick: NEGATIVE
Ketones, ur: NEGATIVE mg/dL
Leukocytes,Ua: NEGATIVE
Nitrite: NEGATIVE
Protein, ur: NEGATIVE mg/dL
Specific Gravity, Urine: 1.008 (ref 1.005–1.030)
pH: 5 (ref 5.0–8.0)

## 2020-07-22 LAB — CBC WITH DIFFERENTIAL/PLATELET
Abs Immature Granulocytes: 0.04 10*3/uL (ref 0.00–0.07)
Basophils Absolute: 0.1 10*3/uL (ref 0.0–0.1)
Basophils Relative: 1 %
Eosinophils Absolute: 0 10*3/uL (ref 0.0–0.5)
Eosinophils Relative: 0 %
HCT: 31.7 % — ABNORMAL LOW (ref 36.0–46.0)
Hemoglobin: 10 g/dL — ABNORMAL LOW (ref 12.0–15.0)
Immature Granulocytes: 0 %
Lymphocytes Relative: 4 %
Lymphs Abs: 0.4 10*3/uL — ABNORMAL LOW (ref 0.7–4.0)
MCH: 27.5 pg (ref 26.0–34.0)
MCHC: 31.5 g/dL (ref 30.0–36.0)
MCV: 87.3 fL (ref 80.0–100.0)
Monocytes Absolute: 0.8 10*3/uL (ref 0.1–1.0)
Monocytes Relative: 7 %
Neutro Abs: 9.4 10*3/uL — ABNORMAL HIGH (ref 1.7–7.7)
Neutrophils Relative %: 88 %
Platelets: 219 10*3/uL (ref 150–400)
RBC: 3.63 MIL/uL — ABNORMAL LOW (ref 3.87–5.11)
RDW: 19.2 % — ABNORMAL HIGH (ref 11.5–15.5)
WBC: 10.8 10*3/uL — ABNORMAL HIGH (ref 4.0–10.5)
nRBC: 0 % (ref 0.0–0.2)

## 2020-07-22 LAB — COMPREHENSIVE METABOLIC PANEL
ALT: 32 U/L (ref 0–44)
AST: 36 U/L (ref 15–41)
Albumin: 2 g/dL — ABNORMAL LOW (ref 3.5–5.0)
Alkaline Phosphatase: 78 U/L (ref 38–126)
Anion gap: 6 (ref 5–15)
BUN: 11 mg/dL (ref 8–23)
CO2: 24 mmol/L (ref 22–32)
Calcium: 7.6 mg/dL — ABNORMAL LOW (ref 8.9–10.3)
Chloride: 106 mmol/L (ref 98–111)
Creatinine, Ser: 1.22 mg/dL — ABNORMAL HIGH (ref 0.44–1.00)
GFR, Estimated: 43 mL/min — ABNORMAL LOW (ref >60)
Glucose, Bld: 127 mg/dL — ABNORMAL HIGH (ref 70–99)
Potassium: 3.6 mmol/L (ref 3.5–5.1)
Sodium: 136 mmol/L (ref 135–145)
Total Bilirubin: 0.5 mg/dL (ref 0.3–1.2)
Total Protein: 5.5 g/dL — ABNORMAL LOW (ref 6.5–8.1)

## 2020-07-22 LAB — RESP PANEL BY RT-PCR (FLU A&B, COVID) ARPGX2
Influenza A by PCR: NEGATIVE
Influenza B by PCR: NEGATIVE
SARS Coronavirus 2 by RT PCR: NEGATIVE

## 2020-07-22 LAB — TROPONIN I (HIGH SENSITIVITY)
Troponin I (High Sensitivity): 37 ng/L — ABNORMAL HIGH (ref <18)
Troponin I (High Sensitivity): 65 ng/L — ABNORMAL HIGH (ref <18)

## 2020-07-22 MED ORDER — LIDOCAINE 5 % EX OINT
1.0000 "application " | TOPICAL_OINTMENT | Freq: Three times a day (TID) | CUTANEOUS | Status: DC | PRN
  Filled 2020-07-22: qty 35.44

## 2020-07-22 MED ORDER — LIDOCAINE 5 % EX PTCH
1.0000 | MEDICATED_PATCH | Freq: Every day | CUTANEOUS | Status: DC | PRN
  Filled 2020-07-22: qty 1

## 2020-07-22 MED ORDER — ACETAMINOPHEN 650 MG RE SUPP: 650.0000 mg | Freq: Four times a day (QID) | RECTAL | Status: DC | PRN

## 2020-07-22 MED ORDER — FUROSEMIDE 10 MG/ML IJ SOLN
40.0000 mg | Freq: Once | INTRAMUSCULAR | Status: AC
  Administered 2020-07-22: 40 mg via INTRAVENOUS
  Filled 2020-07-22: qty 4

## 2020-07-22 MED ORDER — SUCRALFATE 1 GM/10ML PO SUSP: 1.0000 g | Freq: Two times a day (BID) | ORAL | Status: DC | PRN

## 2020-07-22 MED ORDER — FUROSEMIDE 10 MG/ML IJ SOLN
40.0000 mg | Freq: Two times a day (BID) | INTRAMUSCULAR | Status: DC
  Administered 2020-07-23 – 2020-07-26 (×7): 40 mg via INTRAVENOUS
  Filled 2020-07-22 (×7): qty 4

## 2020-07-22 MED ORDER — SODIUM CHLORIDE 0.9 % IV SOLN
2.0000 g | Freq: Three times a day (TID) | INTRAVENOUS | Status: DC
  Administered 2020-07-23 – 2020-07-24 (×4): 2 g via INTRAVENOUS
  Filled 2020-07-22 (×2): qty 2000
  Filled 2020-07-22 (×2): qty 2
  Filled 2020-07-22 (×2): qty 2000

## 2020-07-22 MED ORDER — POLYETHYLENE GLYCOL 3350 17 G PO PACK
17.0000 g | PACK | Freq: Every day | ORAL | Status: DC
  Administered 2020-07-23 – 2020-07-24 (×2): 17 g via ORAL
  Filled 2020-07-22 (×5): qty 1

## 2020-07-22 MED ORDER — ATORVASTATIN CALCIUM 40 MG PO TABS
40.0000 mg | ORAL_TABLET | Freq: Every day | ORAL | Status: DC
  Administered 2020-07-23 – 2020-07-27 (×6): 40 mg via ORAL
  Filled 2020-07-22 (×6): qty 1

## 2020-07-22 MED ORDER — ACETAMINOPHEN 325 MG PO TABS: 650.0000 mg | ORAL_TABLET | Freq: Four times a day (QID) | ORAL | Status: DC | PRN

## 2020-07-22 MED ORDER — SODIUM CHLORIDE 0.9 % IV SOLN
2.0000 g | Freq: Two times a day (BID) | INTRAVENOUS | Status: DC
  Administered 2020-07-23 – 2020-07-28 (×12): 2 g via INTRAVENOUS
  Filled 2020-07-22: qty 20
  Filled 2020-07-22: qty 2
  Filled 2020-07-22 (×3): qty 20
  Filled 2020-07-22: qty 2
  Filled 2020-07-22 (×3): qty 20
  Filled 2020-07-22 (×2): qty 2
  Filled 2020-07-22: qty 20
  Filled 2020-07-22: qty 2
  Filled 2020-07-22: qty 20

## 2020-07-22 MED ORDER — BOOST PLUS PO LIQD
237.0000 mL | Freq: Three times a day (TID) | ORAL | Status: DC
  Administered 2020-07-23 – 2020-07-28 (×12): 237 mL via ORAL
  Filled 2020-07-22 (×21): qty 237

## 2020-07-22 MED ORDER — OXYCODONE HCL 5 MG PO TABS
5.0000 mg | ORAL_TABLET | Freq: Four times a day (QID) | ORAL | Status: DC | PRN
  Administered 2020-07-24 – 2020-07-27 (×4): 5 mg via ORAL
  Filled 2020-07-22 (×4): qty 1

## 2020-07-22 MED ORDER — LACTASE 3000 UNITS PO TABS
3000.0000 [IU] | ORAL_TABLET | Freq: Three times a day (TID) | ORAL | Status: DC | PRN
  Filled 2020-07-22 (×3): qty 1

## 2020-07-22 MED ORDER — LEVOTHYROXINE SODIUM 50 MCG PO TABS
50.0000 ug | ORAL_TABLET | Freq: Every day | ORAL | Status: DC
  Administered 2020-07-23: 50 ug via ORAL
  Filled 2020-07-22: qty 1

## 2020-07-22 MED ORDER — APIXABAN 2.5 MG PO TABS
2.5000 mg | ORAL_TABLET | Freq: Two times a day (BID) | ORAL | Status: DC
  Administered 2020-07-23 – 2020-07-28 (×12): 2.5 mg via ORAL
  Filled 2020-07-22 (×12): qty 1

## 2020-07-22 MED ORDER — HYOSCYAMINE SULFATE 0.125 MG SL SUBL
0.1250 mg | SUBLINGUAL_TABLET | Freq: Every day | SUBLINGUAL | Status: DC | PRN
  Filled 2020-07-22: qty 1

## 2020-07-22 MED ORDER — SACCHAROMYCES BOULARDII 250 MG PO CAPS: 250.0000 mg | ORAL_CAPSULE | Freq: Every day | ORAL | Status: DC | PRN

## 2020-07-22 NOTE — ED Provider Notes (Signed)
[name] Provider Note   CSN: [id number] Arrival date & time: 07/22/20  1847     History Chief Complaint  Patient presents with  . Leg Swelling  . low oxy    [name] is a 87 y.o. female.  She has a history of coronary disease A. 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  She is currently at collapse getting IV antibiotics.  She is complained of increased swelling in her legs going up to her abdomen since yesterday and shortness of breath today.  She does not normally require oxygen and was requiring oxygen. Sats were 70% room air. No chest pain.  No fever.  The history is provided by the patient and the EMS personnel.  Shortness of Breath Severity:  Moderate Onset quality:  Gradual Timing:  Constant Progression:  Unchanged Chronicity:  New Relieved by:  Nothing Worsened by:  Activity Ineffective treatments:  Diuretics Associated symptoms: no abdominal pain, no chest pain, no cough, no fever, no headaches, no hemoptysis, no sore throat, no sputum production and no vomiting        Past Medical History:  Diagnosis Date  . Allergy   . Anemia, pernicious   . Arthritis    bursitis   . Atrophic gastritis   . B12 deficiency   . Bacteremia due to Enterococcus 07/09/2020  . CAD (coronary artery disease)   . Carotid artery stenosis   . Cataract   . Diarrhea    on and off for months  . Family history of adverse reaction to anesthesia    extreme nausea brother and sister  . Fibromyalgia   . Gallbladder polyp   . GERD (gastroesophageal reflux disease)   . Heart murmur   . History of hiatal hernia   . History of kidney stones   . HLD (hyperlipidemia)   . HTN (hypertension)   . Hypothyroidism   . Irritable bowel syndrome (IBS)   . Median arcuate ligament syndrome ([place])   . Osteoporosis   . PVD (peripheral vascular disease) ([place])   . Tubular adenoma of colon 08/2013   with focal high  grade dysplasia    Patient Active Problem List   Diagnosis Date Noted  . Closed fracture of first lumbar vertebra ([place])   . Discitis   . 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 Bacteremia due to Enterococcus 07/09/2020  . Pressure injury of skin 07/05/2020  . Protein-calorie malnutrition, severe 07/05/2020  . Acute lower UTI 07/05/2020  . Compression fracture of L1 vertebra ([place]) 07/05/2020  . Fall at home, initial encounter 07/04/2020  . Fall 07/04/2020  . AKI (acute kidney injury) ([place])   . Transaminitis   . Allergic rhinitis due to pollen 06/08/2020  . Occult blood in stools 06/08/2020  . Gastro-esophageal reflux disease without esophagitis 06/08/2020  . Vasomotor rhinitis 06/08/2020  . Wheezing 06/08/2020  . Diarrhea 10/27/2019  . Gastric polyp   . Intervertebral disc rupture 12/27/2018  . Lymphocytic colitis 09/15/2018  . B12 deficiency 06/29/2018  . Persistent atrial fibrillation ([place]) 04/05/2018  . Loose stools 07/06/2017  . Rectal bleeding 07/06/2017  . Generalized abdominal pain 07/06/2017  . Cervical spondylosis 03/11/2017  . Loss of weight 11/18/2013  . Nausea alone 11/18/2013  . Abdominal pain, epigastric 11/18/2013  . Anemia, iron deficiency 01/27/2013  . Temporomandibular joint-pain-dysfunction syndrome (TMJ) 06/28/2012  . Other abnormal glucose 08/25/2011  . Allergic rhinitis, cause unspecified 08/25/2011  . Right-sided chest wall  pain 08/25/2011  . Murmur 09/20/2010  . Incisional hernia 09/20/2010  . HERNIATED DISC 01/02/2010  . CAROTID ARTERY DISEASE 08/27/2009  . Osteoporosis 06/07/2009  . BRADYCARDIA 02/05/2009  . BENIGN POSITIONAL VERTIGO 09/27/2008  . CELIAC ARTERY COMPRESSION SYNDROME 10/25/2007  . Hypothyroidism 01/08/2007  . Hyperlipidemia 01/08/2007  . Essential hypertension 01/08/2007  . Coronary atherosclerosis 01/08/2007  . PERIPHERAL VASCULAR DISEASE 01/08/2007  . ATROPHIC GASTRITIS 01/08/2007  . ANEMIA, PERNICIOUS, HX OF  01/08/2007  . RENAL CALCULUS, HX OF 01/08/2007  . HYSTERECTOMY, HX OF 01/08/2007  . APPENDECTOMY, HX OF 01/08/2007    Past Surgical History:  Procedure Laterality Date  . ABDOMINAL HYSTERECTOMY    . APPENDECTOMY    . BIOPSY THYROID    . CARDIOVERSION N/A 04/03/2020   Procedure: CARDIOVERSION;  Surgeon: Dorothy Spark, MD;  Location: [facility] ENDOSCOPY;  Service: Cardiovascular;  Laterality: N/A;  . cataract surgery    . CHOLECYSTECTOMY N/A 10/24/2014   Procedure: MICROSCOPIC  CHOLECYSTECTOMY;  Surgeon: Ralene Ok, MD;  Location: [place];  Service: General;  Laterality: N/A;  . COLONOSCOPY    . CORONARY ARTERY BYPASS GRAFT  07  . ESOPHAGEAL MANOMETRY N/A 08/02/2012   Procedure: ESOPHAGEAL MANOMETRY (EM);  Surgeon: Sable Feil, MD;  Location: [facility] ENDOSCOPY;  Service: Endoscopy;  Laterality: N/A;  . ESOPHAGOGASTRODUODENOSCOPY (EGD) WITH PROPOFOL N/A 04/07/2019   Procedure: ESOPHAGOGASTRODUODENOSCOPY (EGD) WITH PROPOFOL;  Surgeon: Milus Banister, MD;  Location: [facility] ENDOSCOPY;  Service: Endoscopy;  Laterality: N/A;  . HEMOSTASIS CLIP PLACEMENT  04/07/2019   Procedure: HEMOSTASIS CLIP PLACEMENT;  Surgeon: Milus Banister, MD;  Location: [facility] ENDOSCOPY;  Service: Endoscopy;;  . HOT HEMOSTASIS N/A 04/07/2019   Procedure: HOT HEMOSTASIS (ARGON PLASMA COAGULATION/BICAP);  Surgeon: Milus Banister, MD;  Location: [facility];  Service: Endoscopy;  Laterality: N/A;  . POLYPECTOMY  04/07/2019   Procedure: POLYPECTOMY;  Surgeon: Milus Banister, MD;  Location: [facility] ENDOSCOPY;  Service: Endoscopy;;  . [name]  04/07/2019   Procedure: [name];  Surgeon: Milus Banister, MD;  Location: [facility] ENDOSCOPY;  Service: Endoscopy;;  . TEE WITHOUT CARDIOVERSION N/A 04/03/2020   Procedure: TRANSESOPHAGEAL ECHOCARDIOGRAM (TEE);  Surgeon: Dorothy Spark, MD;  Location: [facility] ENDOSCOPY;  Service: Cardiovascular;  Laterality: N/A;  . THYROID LOBECTOMY    . UPPER GASTROINTESTINAL ENDOSCOPY       OB  History   No obstetric history on file.     Family History  Problem Relation Age of Onset  . Heart disease Mother   . Cholelithiasis Mother   . Prostate cancer Brother   . Colon cancer Brother   . Breast cancer Other        niece - breast cancer  . Diabetes Sister   . Lung cancer Father   . Lung cancer Sister   . Stomach cancer Cousin   . Esophageal cancer Neg Hx   . Rectal cancer Neg Hx     Social History   Tobacco Use  . Smoking status: Never Smoker  . Smokeless tobacco: Never Used  Vaping Use  . Vaping Use: Never used  Substance Use Topics  . Alcohol use: No    Alcohol/week: 0.0 standard drinks  . Drug use: No    Home Medications Prior to Admission medications   Medication Sig Start Date End Date Taking? Authorizing Provider  acetaminophen (TYLENOL) 500 MG tablet Take 2 tablets (1,000 mg total) by mouth every 8 (eight) hours as needed for mild pain. 07/13/20   Ghimire, Henreitta Leber, MD  ampicillin 2 g in sodium chloride 0.9 % 100 mL Inject 2 g into the vein every 8 (eight) hours. 6 weeks from 07/09/2020. 07/13/20   Jonetta Osgood, MD  atorvastatin (LIPITOR) 80 MG tablet Take 0.5 tablets (40 mg total) by mouth at bedtime. 05/29/20   Burtis Junes, NP  Calcium Carbonate-Vitamin D (CALTRATE 600+D PO) Take 1 tablet by mouth daily with lunch.     [provider]  cefTRIAXone 2 g in sodium chloride 0.9 % 100 mL Inject 2 g into the vein every 12 (twelve) hours. 6 weeks from 07/09/2020. 07/13/20   Jonetta Osgood, MD  cyanocobalamin (,VITAMIN B-12,) 1000 MCG/ML injection Inject 1,000 mcg into the muscle every 30 (thirty) days.    [provider]  ELIQUIS 2.5 MG TABS tablet TAKE 1 TABLET BY MOUTH TWICE DAILY Patient taking differently: Take 2.5 mg by mouth 2 (two) times daily. 06/28/20   Burchette, Alinda Sierras, MD  gabapentin (NEURONTIN) 100 MG capsule Take 1 capsule (100 mg total) by mouth 2 (two) times daily. Patient taking differently: Take 100 mg by mouth as  needed (pain). 01/24/20   Burchette, Alinda Sierras, MD  hyoscyamine (LEVSIN SL) 0.125 MG SL tablet Place 1 tablet (0.125 mg total) under the tongue every 6 (six) hours as needed. Patient taking differently: Place 0.125 mg under the tongue as needed for cramping. 05/23/19   Burchette, Alinda Sierras, MD  ipratropium (ATROVENT) 0.03 % nasal spray Place 2 sprays into both nostrils every 12 (twelve) hours.    [provider]  lactase (LACTAID) 3000 units tablet Take 3,000 Units by mouth 3 (three) times daily with meals as needed (lactose Intolerance).     [provider]  lactose free nutrition (BOOST PLUS) LIQD Take 237 mLs by mouth 3 (three) times daily with meals. 07/13/20   Ghimire, Henreitta Leber, MD  levothyroxine (SYNTHROID, LEVOTHROID) 50 MCG tablet Take 50 mcg by mouth at bedtime.    [provider]  Lidocaine 4 % OINT Apply 1 application topically 3 (three) times daily as needed (pain). Roll-On (otc)    [provider]  Lidocaine 4 % PTCH Place 1 patch onto the skin daily as needed (pain.).     [provider]  Multiple Vitamins-Minerals (PRESERVISION AREDS 2 PO) Take 1 tablet by mouth 2 (two) times daily.    [provider]  oxyCODONE (OXY IR/ROXICODONE) 5 MG immediate release tablet Take 1 tablet (5 mg total) by mouth every 6 (six) hours as needed for severe pain. 07/13/20   Ghimire, Henreitta Leber, MD  Polyethyl Glycol-Propyl Glycol (SYSTANE FREE OP) Place 1 drop into both eyes 3 (three) times daily as needed (dry/irritated eyes.).    [provider]  polyethylene glycol (MIRALAX / GLYCOLAX) 17 g packet Take 17 g by mouth daily. 07/14/20   Ghimire, Henreitta Leber, MD  saccharomyces boulardii (FLORASTOR) 250 MG capsule Take 250 mg by mouth as needed (digestion).    [provider]  sodium chloride (OCEAN) 0.65 % SOLN nasal spray Place 1 spray into both nostrils as needed for congestion.    [provider]  sucralfate (CARAFATE) 1 GM/10ML  suspension TAKE 10 MLS (1 G TOTAL) BY MOUTH 2 (TWO) TIMES DAILY. Patient taking differently: Take 1 g by mouth as needed (reflux). 08/23/19   Burchette, Alinda Sierras, MD    Allergies    Actos [pioglitazone], Sulfonamide derivatives, Adhesive [tape], and Codeine  Review of Systems   Review of Systems  Constitutional: Negative for  fever.  HENT: Negative for sore throat.   Eyes: Negative for visual disturbance.  Respiratory: Positive for shortness of breath. Negative for cough, hemoptysis and sputum production.   Cardiovascular: Positive for leg swelling. Negative for chest pain.  Gastrointestinal: Negative for abdominal pain and vomiting.  Genitourinary: Negative for dysuria.  Musculoskeletal: Positive for back pain.  Skin: Positive for wound.  Neurological: Negative for headaches.    Physical Exam Updated Vital Signs There were no vitals taken for this visit.  Physical Exam Vitals and nursing note reviewed.  Constitutional:      General: She is not in acute distress.    Appearance: Normal appearance. She is well-developed.  HENT:     Head: Normocephalic and atraumatic.  Eyes:     Conjunctiva/sclera: Conjunctivae normal.  Cardiovascular:     Rate and Rhythm: Normal rate and regular rhythm.     Heart sounds: No murmur heard.   Pulmonary:     Effort: Tachypnea and accessory muscle usage present. No respiratory distress.     Breath sounds: Examination of the right-lower field reveals rales. Examination of the left-lower field reveals rales. Rales present.  Abdominal:     Palpations: Abdomen is soft.     Tenderness: There is no abdominal tenderness. There is no guarding or rebound.  Musculoskeletal:        General: Normal range of motion.     Cervical back: Neck supple.     Right lower leg: Edema present.     Left lower leg: Edema present.  Skin:    General: Skin is warm and dry.     Capillary Refill: Capillary refill takes less than 2 seconds.  Neurological:     General: No  focal deficit present.     Mental Status: She is alert.     ED Results / Procedures / Treatments   Labs (all labs ordered are listed, but only abnormal results are displayed) Labs Reviewed  COMPREHENSIVE METABOLIC PANEL - Abnormal; Notable for the following components:      Result Value   Glucose, Bld 127 (*)    Creatinine, Ser 1.22 (*)    Calcium 7.6 (*)    Total Protein 5.5 (*)    Albumin 2.0 (*)    GFR, Estimated 43 (*)    All other components within normal limits  BRAIN NATRIURETIC PEPTIDE - Abnormal; Notable for the following components:   B Natriuretic Peptide 640.7 (*)    All other components within normal limits  CBC WITH DIFFERENTIAL/PLATELET - Abnormal; Notable for the following components:   WBC 10.8 (*)    RBC 3.63 (*)    Hemoglobin 10.0 (*)    HCT 31.7 (*)    RDW 19.2 (*)    Neutro Abs 9.4 (*)    Lymphs Abs 0.4 (*)    All other components within normal limits  URINALYSIS, ROUTINE W REFLEX MICROSCOPIC - Abnormal; Notable for the following components:   Color, Urine STRAW (*)    All other components within normal limits  TSH - Abnormal; Notable for the following components:   TSH 65.305 (*)    All other components within normal limits  CBC - Abnormal; Notable for the following components:   RBC 3.26 (*)    Hemoglobin 9.1 (*)    HCT 27.7 (*)    RDW 18.8 (*)    All other components within normal limits  RENAL FUNCTION PANEL - Abnormal; Notable for the following components:   Calcium 7.5 (*)  Albumin 1.9 (*)    All other components within normal limits  TROPONIN I (HIGH SENSITIVITY) - Abnormal; Notable for the following components:   Troponin I (High Sensitivity) 37 (*)    All other components within normal limits  TROPONIN I (HIGH SENSITIVITY) - Abnormal; Notable for the following components:   Troponin I (High Sensitivity) 65 (*)    All other components within normal limits  TROPONIN I (HIGH SENSITIVITY) - Abnormal; Notable for the following components:    Troponin I (High Sensitivity) 54 (*)    All other components within normal limits  RESP PANEL BY RT-PCR (FLU A&B, COVID) ARPGX2  MRSA PCR SCREENING  MAGNESIUM  TSH  TROPONIN I (HIGH SENSITIVITY)    EKG EKG Interpretation  Date/Time:  Sunday July 22 2020 19:49:01 EDT Ventricular Rate:  64 PR Interval:    QRS Duration: 149 QT Interval:  515 QTC Calculation: 532 R Axis:   111 Text Interpretation: Sinus with PR prolongation Probable LVH with secondary repol abnrm Prolonged QT interval Confirmed by Aletta Edouard [phone]) on 07/22/2020 7:52:40 PM Also confirmed by Aletta Edouard [phone]), editor Hattie Perch [phone])  on 07/23/2020 7:07:30 AM   Radiology DG Chest Port 1 View  Result Date: 07/22/2020 CLINICAL DATA:  Shortness of breath EXAM: PORTABLE CHEST 1 VIEW COMPARISON:  Chest radiograph July 04, 2020 and chest CT Sep 23, 2005 FINDINGS: Cardiomegaly, similar prior. Small to moderate bilateral pleural effusions with bibasilar airspace opacities. No visible pneumothorax. Changes of prior median sternotomy and CABG. The visualized skeletal structures are unchanged. IMPRESSION: 1. Cardiomegaly with small to moderate bilateral pleural effusions and bibasilar airspace opacities, likely pulmonary edema. Electronically Signed   By: Dahlia Bailiff MD   On: 07/22/2020 19:24    Procedures Procedures   Medications Ordered in ED Medications  oxyCODONE (Oxy IR/ROXICODONE) immediate release tablet 5 mg (has no administration in time range)  ampicillin (OMNIPEN) 2 g in sodium chloride 0.9 % 100 mL IVPB (2 g Intravenous New Bag/Given 07/23/20 0120)  cefTRIAXone (ROCEPHIN) 2 g in sodium chloride 0.9 % 100 mL IVPB (2 g Intravenous New Bag/Given 07/23/20 0034)  atorvastatin (LIPITOR) tablet 40 mg (40 mg Oral Given 07/23/20 0015)  hyoscyamine (LEVSIN SL) SL tablet 0.125 mg (has no administration in time range)  lactase (LACTAID) tablet 3,000 Units (has no administration in time range)   polyethylene glycol (MIRALAX / GLYCOLAX) packet 17 g (has no administration in time range)  saccharomyces boulardii (FLORASTOR) capsule 250 mg (has no administration in time range)  sucralfate (CARAFATE) 1 GM/10ML suspension 1 g (has no administration in time range)  apixaban (ELIQUIS) tablet 2.5 mg (2.5 mg Oral Given 07/23/20 0015)  lactose free nutrition (BOOST PLUS) liquid 237 mL (has no administration in time range)  lidocaine (LIDODERM) 5 % 1 patch (has no administration in time range)  furosemide (LASIX) injection 40 mg (40 mg Intravenous Given 07/23/20 0646)  acetaminophen (TYLENOL) tablet 650 mg (has no administration in time range)    Or  acetaminophen (TYLENOL) suppository 650 mg (has no administration in time range)  lidocaine (LMX) 4 % cream (has no administration in time range)  levothyroxine (SYNTHROID) tablet 75 mcg (has no administration in time range)  furosemide (LASIX) injection 40 mg (40 mg Intravenous Given 07/22/20 2041)  levothyroxine (SYNTHROID) tablet 25 mcg (25 mcg Oral Given 07/23/20 0802)    ED Course  I have reviewed the triage vital signs and the nursing notes.  Pertinent labs & imaging results that were available during  my care of the patient were reviewed by me and considered in my medical decision making (see chart for details).  Clinical Course as of 07/23/20 7619  Nancy Fetter Jul 22, 2020  1936 Chest x-ray interpreted by me as cardiomegaly bilateral effusions and likely pulmonary edema. [MB]  [address] with [facility] hospitalist Dr. Hal Hope who will evaluate the patient for admission. [MB]    Clinical Course User Index [MB] Hayden Rasmussen, MD   MDM Rules/Calculators/A&P                          CHA2DS2/VAS Stroke Risk Points  Current as of 8 minutes ago     6 >= 2 Points: High Risk  1 - 1.99 Points: Medium Risk  0 Points: Low Risk    Last Change: N/A      Details    This score determines the patient's risk of having a stroke if the  patient has atrial  fibrillation.       Points Metrics  1 Has Congestive Heart Failure:  Yes    Current as of 8 minutes ago  1 Has Vascular Disease:  Yes    Current as of 8 minutes ago  1 Has Hypertension:  Yes    Current as of 8 minutes ago  2 Age:  20    Current as of 8 minutes ago  0 Has Diabetes:  No    Current as of 8 minutes ago  0 Had Stroke:  No  Had TIA:  No  Had Thromboembolism:  No    Current as of 8 minutes ago  1 Female:  Yes    Current as of 8 minutes ago         This patient complains of shortness of breath hypoxia peripheral edema; this involves an extensive number of treatment Options and is a complaint that carries with it a high risk of complications and Morbidity. The differential includes CHF, pneumonia, PE Covid, pneumothorax, anemia, ACS  I ordered, reviewed and interpreted labs, which included CBC with mildly elevated white count, hemoglobin low but stable from priors, chemistries fairly unremarkable other than low calcium low albumin troponins elevated will need to be trended, BNP elevated I ordered medication IV Lasix for diuresis I ordered imaging studies which included chest x-ray and I independently    visualized and interpreted imaging which showed bilateral effusions and pulmonary edema Previous records obtained and reviewed in epic including prior discharge summary I consulted [facility] hospitalist Dr. Hal Hope and discussed lab and imaging findings  Critical Interventions: None  After the interventions stated above, I reevaluated the patient and found patient to be requiring oxygen.  Clinically CHF.  Will need to be admitted for hypoxia and IV diuresis.   Final Clinical Impression(s) / ED Diagnoses Final diagnoses:  Acute respiratory failure with hypoxia ([place])  Acute congestive heart failure, unspecified heart failure type [facility])    Rx / DC Orders ED Discharge Orders    None       Hayden Rasmussen, MD 07/23/20 [phone]

## 2020-07-22 NOTE — ED Triage Notes (Signed)
Arrived via EMS; from [place] [facility]. Concern for increasing bilateral leg swelling as well as Sp02 of 70s on room air. EMS endorsed improved to 90s when placed on 6L supplemental o2 via [place]; Reported hx of endocarditis; PICC line in place for IV ABX. Endorsed hx of afib as well. And recently given Lasix from facility.

## 2020-07-22 NOTE — H&P (Signed)
History and Physical    [name] NKN:[id number] DOB: 04/28/1933 DOA: 07/22/2020  PCP: [name], MD  Patient coming from: [place].  Chief Complaint: Shortness of breath and lower extremity edema.  HPI: [name] is a 87 y.o. female with known history of atrial fibrillation, hypothyroidism, atrophic gastritis who was recently admitted and discharged on July 13, 2020 after being diagnosed with enterococcal bacteremia and aortic valve endocarditis with possible T12/L1 discitis has been experiencing shortness of breath and increasing peripheral edema over the last 3 days.  Patient was placed on compression stocking and Lasix despite which patient was getting more short of breath was referred to the ER.  Denies any chest pain productive cough fever or chills.  ED Course: In the ER chest x-ray shows bilateral moderate pleural effusion with BNP of 640 I sensitive troponin was 37 and 65.  EKG shows sinus rhythm.  2D echo done on March 11 was showing EF of 40 to 45% with aortic valve regurgitation.  Patient was given Lasix 40 mg IV admitted for CHF.  Covid test is negative.  Labs are largely at baseline with hemoglobin around 10.  Review of Systems: As per HPI, rest all negative.   Past Medical History:  Diagnosis Date  . Allergy   . Anemia, pernicious   . Arthritis    bursitis   . Atrophic gastritis   . B12 deficiency   . Bacteremia due to Enterococcus 07/09/2020  . CAD (coronary artery disease)   . Carotid artery stenosis   . Cataract   . Diarrhea    on and off for months  . Family history of adverse reaction to anesthesia    extreme nausea brother and sister  . Fibromyalgia   . Gallbladder polyp   . GERD (gastroesophageal reflux disease)   . Heart murmur   . History of hiatal hernia   . History of kidney stones   . HLD (hyperlipidemia)   . HTN (hypertension)   . Hypothyroidism   . Irritable bowel syndrome (IBS)   . Median arcuate ligament  syndrome ([place])   . Osteoporosis   . PVD (peripheral vascular disease) ([place])   . Tubular adenoma of colon 08/2013   with focal high grade dysplasia    Past Surgical History:  Procedure Laterality Date  . ABDOMINAL HYSTERECTOMY    . APPENDECTOMY    . BIOPSY THYROID    . CARDIOVERSION N/A 04/03/2020   Procedure: CARDIOVERSION;  Surgeon: Dorothy Spark, MD;  Location: [facility] ENDOSCOPY;  Service: Cardiovascular;  Laterality: N/A;  . cataract surgery    . CHOLECYSTECTOMY N/A 10/24/2014   Procedure: MICROSCOPIC  CHOLECYSTECTOMY;  Surgeon: Ralene Ok, MD;  Location: [place];  Service: General;  Laterality: N/A;  . COLONOSCOPY    . CORONARY ARTERY BYPASS GRAFT  07  . ESOPHAGEAL MANOMETRY N/A 08/02/2012   Procedure: ESOPHAGEAL MANOMETRY (EM);  Surgeon: Sable Feil, MD;  Location: [facility] ENDOSCOPY;  Service: Endoscopy;  Laterality: N/A;  . ESOPHAGOGASTRODUODENOSCOPY (EGD) WITH PROPOFOL N/A 04/07/2019   Procedure: ESOPHAGOGASTRODUODENOSCOPY (EGD) WITH PROPOFOL;  Surgeon: Milus Banister, MD;  Location: [facility] ENDOSCOPY;  Service: Endoscopy;  Laterality: N/A;  . HEMOSTASIS CLIP PLACEMENT  04/07/2019   Procedure: HEMOSTASIS CLIP PLACEMENT;  Surgeon: Milus Banister, MD;  Location: [facility] ENDOSCOPY;  Service: Endoscopy;;  . HOT HEMOSTASIS N/A 04/07/2019   Procedure: HOT HEMOSTASIS (ARGON PLASMA COAGULATION/BICAP);  Surgeon: Milus Banister, MD;  Location: [facility] ENDOS[facility];  Service: Endoscopy;  Laterality: N/A;  . POLYPECTOMY  04/07/2019   Procedure: POLYPECTOMY;  Surgeon: Milus Banister, MD;  Location: [facility] ENDOSCOPY;  Service: Endoscopy;;  . [name]  04/07/2019   Procedure: [name];  Surgeon: Milus Banister, MD;  Location: [facility] ENDOSCOPY;  Service: Endoscopy;;  . TEE WITHOUT CARDIOVERSION N/A 04/03/2020   Procedure: TRANSESOPHAGEAL ECHOCARDIOGRAM (TEE);  Surgeon: Dorothy Spark, MD;  Location: [facility] ENDOSCOPY;  Service: Cardiovascular;  Laterality: N/A;  . THYROID LOBECTOMY    . UPPER  GASTROINTESTINAL ENDOSCOPY       reports that she has never smoked. She has never used smokeless tobacco. She reports that she does not drink alcohol and does not use drugs.  Allergies  Allergen Reactions  . Actos [Pioglitazone] Swelling    Ankle swelling  . Sulfonamide Derivatives Swelling  . Adhesive [Tape] Other (See Comments)    WITH PROLONGED EXPOSURE (REDNESS/ITCHING)  . Codeine Nausea And Vomiting    Family History  Problem Relation Age of Onset  . Heart disease Mother   . Cholelithiasis Mother   . Prostate cancer Brother   . Colon cancer Brother   . Breast cancer Other        niece - breast cancer  . Diabetes Sister   . Lung cancer Father   . Lung cancer Sister   . Stomach cancer Cousin   . Esophageal cancer Neg Hx   . Rectal cancer Neg Hx     Prior to Admission medications   Medication Sig Start Date End Date Taking? Authorizing Provider  acetaminophen (TYLENOL) 500 MG tablet Take 2 tablets (1,000 mg total) by mouth every 8 (eight) hours as needed for mild pain. 07/13/20   Ghimire, Henreitta Leber, MD  ampicillin 2 g in sodium chloride 0.9 % 100 mL Inject 2 g into the vein every 8 (eight) hours. 6 weeks from 07/09/2020. 07/13/20   Jonetta Osgood, MD  atorvastatin (LIPITOR) 80 MG tablet Take 0.5 tablets (40 mg total) by mouth at bedtime. 05/29/20   Burtis Junes, NP  Calcium Carbonate-Vitamin D (CALTRATE 600+D PO) Take 1 tablet by mouth daily with lunch.     [provider]  cefTRIAXone 2 g in sodium chloride 0.9 % 100 mL Inject 2 g into the vein every 12 (twelve) hours. 6 weeks from 07/09/2020. 07/13/20   Jonetta Osgood, MD  cyanocobalamin (,VITAMIN B-12,) 1000 MCG/ML injection Inject 1,000 mcg into the muscle every 30 (thirty) days.    [provider]  ELIQUIS 2.5 MG TABS tablet TAKE 1 TABLET BY MOUTH TWICE DAILY Patient taking differently: Take 2.5 mg by mouth 2 (two) times daily. 06/28/20   Burchette, Alinda Sierras, MD  gabapentin (NEURONTIN) 100 MG  capsule Take 1 capsule (100 mg total) by mouth 2 (two) times daily. Patient taking differently: Take 100 mg by mouth as needed (pain). 01/24/20   Burchette, Alinda Sierras, MD  hyoscyamine (LEVSIN SL) 0.125 MG SL tablet Place 1 tablet (0.125 mg total) under the tongue every 6 (six) hours as needed. Patient taking differently: Place 0.125 mg under the tongue as needed for cramping. 05/23/19   Burchette, Alinda Sierras, MD  ipratropium (ATROVENT) 0.03 % nasal spray Place 2 sprays into both nostrils every 12 (twelve) hours.    [provider]  lactase (LACTAID) 3000 units tablet Take 3,000 Units by mouth 3 (three) times daily with meals as needed (lactose Intolerance).     [provider]  lactose free nutrition (BOOST PLUS) LIQD Take 237 mLs by mouth  3 (three) times daily with meals. 07/13/20   Ghimire, Henreitta Leber, MD  levothyroxine (SYNTHROID, LEVOTHROID) 50 MCG tablet Take 50 mcg by mouth at bedtime.    [provider]  Lidocaine 4 % OINT Apply 1 application topically 3 (three) times daily as needed (pain). Roll-On (otc)    [provider]  Lidocaine 4 % PTCH Place 1 patch onto the skin daily as needed (pain.).     [provider]  Multiple Vitamins-Minerals (PRESERVISION AREDS 2 PO) Take 1 tablet by mouth 2 (two) times daily.    [provider]  oxyCODONE (OXY IR/ROXICODONE) 5 MG immediate release tablet Take 1 tablet (5 mg total) by mouth every 6 (six) hours as needed for severe pain. 07/13/20   Ghimire, Henreitta Leber, MD  Polyethyl Glycol-Propyl Glycol (SYSTANE FREE OP) Place 1 drop into both eyes 3 (three) times daily as needed (dry/irritated eyes.).    [provider]  polyethylene glycol (MIRALAX / GLYCOLAX) 17 g packet Take 17 g by mouth daily. 07/14/20   Ghimire, Henreitta Leber, MD  saccharomyces boulardii (FLORASTOR) 250 MG capsule Take 250 mg by mouth as needed (digestion).    [provider]  sodium chloride (OCEAN) 0.65 % SOLN nasal spray Place 1  spray into both nostrils as needed for congestion.    [provider]  sucralfate (CARAFATE) 1 GM/10ML suspension TAKE 10 MLS (1 G TOTAL) BY MOUTH 2 (TWO) TIMES DAILY. Patient taking differently: Take 1 g by mouth as needed (reflux). 08/23/19   Burchette, Alinda Sierras, MD    Physical Exam: Constitutional: Moderately built and nourished. Vitals:   07/22/20 1945 07/22/20 2006  BP: (!) 125/46 (!) 119/43  Pulse:  61  Resp: 18 17  SpO2: 98% 99%   Eyes: Anicteric no pallor. ENMT: No discharge from the ears eyes nose or mouth. Neck: JVD elevated no mass felt. Respiratory: No rhonchi or crepitations. Cardiovascular: S1-S2 heard. Abdomen: Soft nontender bowel sounds present. Musculoskeletal: Bilateral lower extremity edema present.  With compression stockings present. Skin: Chronic skin changes. Neurologic: Alert awake oriented to time place and person.  Moves all extremities. Psychiatric: Appears normal.  Normal affect.   Labs on Admission: I have personally reviewed following labs and imaging studies  CBC: Recent Labs  Lab 07/22/20 1927  WBC 10.8*  NEUTROABS 9.4*  HGB 10.0*  HCT 31.7*  MCV 87.3  PLT 785   Basic Metabolic Panel: Recent Labs  Lab 07/22/20 1927  NA 136  K 3.6  CL 106  CO2 24  GLUCOSE 127*  BUN 11  CREATININE 1.22*  CALCIUM 7.6*   GFR: Estimated Creatinine Clearance: 23.1 mL/min (A) (by C-G formula based on SCr of 1.22 mg/dL (H)). Liver Function Tests: Recent Labs  Lab 07/22/20 1927  AST 36  ALT 32  ALKPHOS 78  BILITOT 0.5  PROT 5.5*  ALBUMIN 2.0*   No results for input(s): LIPASE, AMYLASE in the last 168 hours. No results for input(s): AMMONIA in the last 168 hours. Coagulation Profile: No results for input(s): INR, PROTIME in the last 168 hours. Cardiac Enzymes: No results for input(s): CKTOTAL, CKMB, CKMBINDEX, TROPONINI in the last 168 hours. BNP (last 3 results) No results for input(s): PROBNP in the last 8760 hours. HbA1C: No  results for input(s): HGBA1C in the last 72 hours. CBG: No results for input(s): GLUCAP in the last 168 hours. Lipid Profile: No results for input(s): CHOL, HDL, LDLCALC, TRIG, CHOLHDL, LDLDIRECT in the last 72 hours. Thyroid Function Tests:  No results for input(s): TSH, T4TOTAL, FREET4, T3FREE, THYROIDAB in the last 72 hours. Anemia Panel: No results for input(s): VITAMINB12, FOLATE, FERRITIN, TIBC, IRON, RETICCTPCT in the last 72 hours. Urine analysis:    Component Value Date/Time   COLORURINE STRAW (A) 07/22/2020 1910   APPEARANCEUR CLEAR 07/22/2020 1910   LABSPEC 1.008 07/22/2020 1910   PHURINE 5.0 07/22/2020 1910   GLUCOSEU NEGATIVE 07/22/2020 1910   GLUCOSEU NEGATIVE 06/22/2013 1619   HGBUR NEGATIVE 07/22/2020 1910   HGBUR negative 07/30/2009 1606   BILIRUBINUR NEGATIVE 07/22/2020 1910   BILIRUBINUR negative 12/05/2019 1535   KETONESUR NEGATIVE 07/22/2020 1910   PROTEINUR NEGATIVE 07/22/2020 1910   UROBILINOGEN 0.2 12/05/2019 1535   UROBILINOGEN 0.2 06/22/2013 1619   NITRITE NEGATIVE 07/22/2020 1910   LEUKOCYTESUR NEGATIVE 07/22/2020 1910   Sepsis Labs: @LABRCNTIP (procalcitonin:4,lacticidven:4) ) Recent Results (from the past 240 hour(s))  Resp Panel by RT-PCR (Flu A&B, Covid) Nasopharyngeal Swab     Status: None   Collection Time: 07/13/20  9:43 AM   Specimen: Nasopharyngeal Swab; Nasopharyngeal(NP) swabs in vial transport medium  Result Value Ref Range Status   SARS Coronavirus 2 by RT PCR NEGATIVE NEGATIVE Final    Comment: (NOTE) SARS-CoV-2 target nucleic acids are NOT DETECTED.  The SARS-CoV-2 RNA is generally detectable in upper respiratory specimens during the acute phase of infection. The lowest concentration of SARS-CoV-2 viral copies this assay can detect is 138 copies/mL. A negative result does not preclude SARS-Cov-2 infection and should not be used as the sole basis for treatment or other patient management decisions. A negative result may occur with   improper specimen collection/handling, submission of specimen other than nasopharyngeal swab, presence of viral mutation(s) within the areas targeted by this assay, and inadequate number of viral copies(<138 copies/mL). A negative result must be combined with clinical observations, patient history, and epidemiological information. The expected result is Negative.  Fact Sheet for Patients:  EntrepreneurPulse.com.au  Fact Sheet for Healthcare Providers:  IncredibleEmployment.be  This test is no t yet approved or cleared by the [place] FDA and  has been authorized for detection and/or diagnosis of SARS-CoV-2 by FDA under an Emergency Use Authorization (EUA). This EUA will remain  in effect (meaning this test can be used) for the duration of the COVID-19 declaration under Section 564(b)(1) of the Act, 21 U.S.C.section 360bbb-3(b)(1), unless the authorization is terminated  or revoked sooner.       Influenza A by PCR NEGATIVE NEGATIVE Final   Influenza B by PCR NEGATIVE NEGATIVE Final    Comment: (NOTE) The Xpert Xpress SARS-CoV-2/FLU/RSV plus assay is intended as an aid in the diagnosis of influenza from Nasopharyngeal swab specimens and should not be used as a sole basis for treatment. Nasal washings and aspirates are unacceptable for Xpert Xpress SARS-CoV-2/FLU/RSV testing.  Fact Sheet for Patients: EntrepreneurPulse.com.au  Fact Sheet for Healthcare Providers: IncredibleEmployment.be  This test is not yet approved or cleared by the [place] FDA and has been authorized for detection and/or diagnosis of SARS-CoV-2 by FDA under an Emergency Use Authorization (EUA). This EUA will remain in effect (meaning this test can be used) for the duration of the COVID-19 declaration under Section 564(b)(1) of the Act, 21 U.S.C. section 360bbb-3(b)(1), unless the authorization is terminated  or revoked.  Performed at [facility], [place] [address] [place], [place] [address]   Resp Panel by RT-PCR (Flu A&B, Covid) Nasopharyngeal Swab     Status: None   Collection Time: 07/22/20  7:24 PM  Specimen: Nasopharyngeal Swab; Nasopharyngeal(NP) swabs in vial transport medium  Result Value Ref Range Status   SARS Coronavirus 2 by RT PCR NEGATIVE NEGATIVE Final    Comment: (NOTE) SARS-CoV-2 target nucleic acids are NOT DETECTED.  The SARS-CoV-2 RNA is generally detectable in upper respiratory specimens during the acute phase of infection. The lowest concentration of SARS-CoV-2 viral copies this assay can detect is 138 copies/mL. A negative result does not preclude SARS-Cov-2 infection and should not be used as the sole basis for treatment or other patient management decisions. A negative result may occur with  improper specimen collection/handling, submission of specimen other than nasopharyngeal swab, presence of viral mutation(s) within the areas targeted by this assay, and inadequate number of viral copies(<138 copies/mL). A negative result must be combined with clinical observations, patient history, and epidemiological information. The expected result is Negative.  Fact Sheet for Patients:  EntrepreneurPulse.com.au  Fact Sheet for Healthcare Providers:  IncredibleEmployment.be  This test is no t yet approved or cleared by the [place] FDA and  has been authorized for detection and/or diagnosis of SARS-CoV-2 by FDA under an Emergency Use Authorization (EUA). This EUA will remain  in effect (meaning this test can be used) for the duration of the COVID-19 declaration under Section 564(b)(1) of the Act, 21 U.S.C.section 360bbb-3(b)(1), unless the authorization is terminated  or revoked sooner.       Influenza A by PCR NEGATIVE NEGATIVE Final   Influenza B by PCR NEGATIVE NEGATIVE Final    Comment: (NOTE) The Xpert Xpress  SARS-CoV-2/FLU/RSV plus assay is intended as an aid in the diagnosis of influenza from Nasopharyngeal swab specimens and should not be used as a sole basis for treatment. Nasal washings and aspirates are unacceptable for Xpert Xpress SARS-CoV-2/FLU/RSV testing.  Fact Sheet for Patients: EntrepreneurPulse.com.au  Fact Sheet for Healthcare Providers: IncredibleEmployment.be  This test is not yet approved or cleared by the [place] FDA and has been authorized for detection and/or diagnosis of SARS-CoV-2 by FDA under an Emergency Use Authorization (EUA). This EUA will remain in effect (meaning this test can be used) for the duration of the COVID-19 declaration under Section 564(b)(1) of the Act, 21 U.S.C. section 360bbb-3(b)(1), unless the authorization is terminated or revoked.  Performed at [facility], [place] [address], [place], [place] [address]      Radiological Exams on Admission: DG Chest Port 1 View  Result Date: 07/22/2020 CLINICAL DATA:  Shortness of breath EXAM: PORTABLE CHEST 1 VIEW COMPARISON:  Chest radiograph July 04, 2020 and chest CT Sep 23, 2005 FINDINGS: Cardiomegaly, similar prior. Small to moderate bilateral pleural effusions with bibasilar airspace opacities. No visible pneumothorax. Changes of prior median sternotomy and CABG. The visualized skeletal structures are unchanged. IMPRESSION: 1. Cardiomegaly with small to moderate bilateral pleural effusions and bibasilar airspace opacities, likely pulmonary edema. Electronically Signed   By: Dahlia Bailiff MD   On: 07/22/2020 19:24    EKG: Independently reviewed.  Normal sinus rhythm.  Assessment/Plan Active Problems:   Essential hypertension   Persistent atrial fibrillation (HCC)   Lymphocytic colitis   Bacteremia due to Enterococcus   Discitis   Acute CHF (congestive heart failure) (HCC)   Acute systolic CHF (congestive heart failure) ([place])    1. Acute systolic  heart failure last EF measured in July 06, 2020 2 weeks ago was 40 to 45% at the time patient also had aortic valve vegetation.  Patient has been placed on Lasix 40 mg IV every 12.  We will recheck 2D echo to reassess patient's aortic valve to see if there is any worsening regurgitation.  Follow intake output metabolic panel daily weights.  Check Dopplers of the lower extremities. 2. Recently diagnosed Enterococcus bacteremia with aortic valve endocarditis with possible T12/L1 discitis presently on ceftriaxone and ampicillin.  Discussed with pharmacy about the antibiotics.  Patient is supposed to be on 6 weeks of antibiotics from July 09, 2020 for which patient is on PICC line. 3. Chronic anemia follow CBC. 4. History of A. fib presently not on any rate controlling medication since patient had bradycardic episodes during last admission.  On apixaban. 5. History of atrophic gastritis on PPI and Carafate. 6. Hypothyroidism on Synthroid.  Check TSH.  Since patient has symptoms of acute CHF will need close monitoring for any further worsening in inpatient status.   DVT prophylaxis: Apixaban. Code Status: DNR. Family Communication: Family at the bedside. Disposition Plan: Back to facility when stable. Consults called: None. Admission status: Inpatient.   Rise Patience MD [facility] Pager [phone].  If 7PM-7AM, please contact night-coverage [email or web address] Password [email or web address]  07/22/2020, 10:10 PM

## 2020-07-23 ENCOUNTER — Inpatient Hospital Stay (HOSPITAL_COMMUNITY): Payer: Medicare Other

## 2020-07-23 DIAGNOSIS — I5021 Acute systolic (congestive) heart failure: Secondary | ICD-10-CM | POA: Diagnosis not present

## 2020-07-23 DIAGNOSIS — R609 Edema, unspecified: Secondary | ICD-10-CM

## 2020-07-23 DIAGNOSIS — E43 Unspecified severe protein-calorie malnutrition: Secondary | ICD-10-CM

## 2020-07-23 DIAGNOSIS — G8929 Other chronic pain: Secondary | ICD-10-CM

## 2020-07-23 DIAGNOSIS — N189 Chronic kidney disease, unspecified: Secondary | ICD-10-CM

## 2020-07-23 DIAGNOSIS — I358 Other nonrheumatic aortic valve disorders: Secondary | ICD-10-CM

## 2020-07-23 DIAGNOSIS — R5381 Other malaise: Secondary | ICD-10-CM

## 2020-07-23 DIAGNOSIS — M545 Low back pain, unspecified: Secondary | ICD-10-CM

## 2020-07-23 DIAGNOSIS — R0602 Shortness of breath: Secondary | ICD-10-CM | POA: Diagnosis not present

## 2020-07-23 DIAGNOSIS — R778 Other specified abnormalities of plasma proteins: Secondary | ICD-10-CM

## 2020-07-23 DIAGNOSIS — D631 Anemia in chronic kidney disease: Secondary | ICD-10-CM

## 2020-07-23 DIAGNOSIS — N179 Acute kidney failure, unspecified: Secondary | ICD-10-CM

## 2020-07-23 DIAGNOSIS — E039 Hypothyroidism, unspecified: Secondary | ICD-10-CM

## 2020-07-23 LAB — TSH
TSH: 65.305 u[IU]/mL — ABNORMAL HIGH (ref 0.350–4.500)
TSH: 60.606 u[IU]/mL — ABNORMAL HIGH (ref 0.350–4.500)

## 2020-07-23 LAB — ECHOCARDIOGRAM COMPLETE
Area-P 1/2: 3.21 cm2
Calc EF: 50.3 %
MV VTI: 1.63 cm2
P 1/2 time: 490 msec
Radius: 0.4 cm
S' Lateral: 2.6 cm
Single Plane A2C EF: 49.1 %
Single Plane A4C EF: 50.5 %

## 2020-07-23 LAB — RENAL FUNCTION PANEL
Albumin: 1.9 g/dL — ABNORMAL LOW (ref 3.5–5.0)
Anion gap: 5 (ref 5–15)
BUN: 11 mg/dL (ref 8–23)
CO2: 30 mmol/L (ref 22–32)
Calcium: 7.5 mg/dL — ABNORMAL LOW (ref 8.9–10.3)
Chloride: 103 mmol/L (ref 98–111)
Creatinine, Ser: 0.82 mg/dL (ref 0.44–1.00)
GFR, Estimated: >60 mL/min (ref >60)
Glucose, Bld: 84 mg/dL (ref 70–99)
Phosphorus: 2.7 mg/dL (ref 2.5–4.6)
Potassium: 3.7 mmol/L (ref 3.5–5.1)
Sodium: 138 mmol/L (ref 135–145)

## 2020-07-23 LAB — CBC
HCT: 27.7 % — ABNORMAL LOW (ref 36.0–46.0)
Hemoglobin: 9.1 g/dL — ABNORMAL LOW (ref 12.0–15.0)
MCH: 27.9 pg (ref 26.0–34.0)
MCHC: 32.9 g/dL (ref 30.0–36.0)
MCV: 85 fL (ref 80.0–100.0)
Platelets: 201 10*3/uL (ref 150–400)
RBC: 3.26 MIL/uL — ABNORMAL LOW (ref 3.87–5.11)
RDW: 18.8 % — ABNORMAL HIGH (ref 11.5–15.5)
WBC: 8.8 10*3/uL (ref 4.0–10.5)
nRBC: 0 % (ref 0.0–0.2)

## 2020-07-23 LAB — TROPONIN I (HIGH SENSITIVITY)
Troponin I (High Sensitivity): 54 ng/L — ABNORMAL HIGH (ref <18)
Troponin I (High Sensitivity): 37 ng/L — ABNORMAL HIGH (ref <18)

## 2020-07-23 LAB — MRSA PCR SCREENING: MRSA by PCR: NEGATIVE

## 2020-07-23 LAB — GLUCOSE, CAPILLARY: Glucose-Capillary: 92 mg/dL (ref 70–99)

## 2020-07-23 LAB — MAGNESIUM: Magnesium: 1.8 mg/dL (ref 1.7–2.4)

## 2020-07-23 MED ORDER — LIDOCAINE 4 % EX CREA
TOPICAL_CREAM | Freq: Three times a day (TID) | CUTANEOUS | Status: DC | PRN
  Filled 2020-07-23: qty 5

## 2020-07-23 MED ORDER — LEVOTHYROXINE SODIUM 75 MCG PO TABS
75.0000 ug | ORAL_TABLET | Freq: Every day | ORAL | Status: DC
  Administered 2020-07-24 – 2020-07-28 (×5): 75 ug via ORAL
  Filled 2020-07-23 (×5): qty 1

## 2020-07-23 MED ORDER — CHLORHEXIDINE GLUCONATE CLOTH 2 % EX PADS
6.0000 | MEDICATED_PAD | Freq: Every day | CUTANEOUS | Status: DC
  Administered 2020-07-23 – 2020-07-28 (×6): 6 via TOPICAL

## 2020-07-23 MED ORDER — LEVOTHYROXINE SODIUM 25 MCG PO TABS
25.0000 ug | ORAL_TABLET | Freq: Once | ORAL | Status: AC
  Administered 2020-07-23: 25 ug via ORAL
  Filled 2020-07-23: qty 1

## 2020-07-23 NOTE — Plan of Care (Signed)
  Problem: Education: Goal: Knowledge of General Education information will improve Description: Including pain rating scale, medication(s)/side effects and non-pharmacologic comfort measures Outcome: Progressing   Problem: Coping: Goal: Level of anxiety will decrease Outcome: Progressing   Problem: Pain Managment: Goal: General experience of comfort will improve Outcome: Progressing   Problem: Safety: Goal: Ability to remain free from injury will improve Outcome: Progressing   Problem: Health Behavior/Discharge Planning: Goal: Ability to manage health-related needs will improve Outcome: Not Progressing   Problem: Clinical Measurements: Goal: Ability to maintain clinical measurements within normal limits will improve Outcome: Not Progressing Goal: Will remain free from infection Outcome: Not Progressing Goal: Diagnostic test results will improve Outcome: Not Progressing Goal: Respiratory complications will improve Outcome: Not Progressing Goal: Cardiovascular complication will be avoided Outcome: Not Progressing   Problem: Activity: Goal: Risk for activity intolerance will decrease Outcome: Not Progressing   Problem: Nutrition: Goal: Adequate nutrition will be maintained Outcome: Not Progressing   Problem: Elimination: Goal: Will not experience complications related to bowel motility Outcome: Not Progressing Goal: Will not experience complications related to urinary retention Outcome: Not Progressing   Problem: Skin Integrity: Goal: Risk for impaired skin integrity will decrease Outcome: Not Progressing

## 2020-07-23 NOTE — NC FL2 (Signed)
[place] LEVEL OF CARE SCREENING TOOL     IDENTIFICATION  Patient Name: [name] Birthdate: 1932/08/30 Sex: female Admission Date (Current Location): 07/22/2020  [facility] and [place] Number:  Herbalist and Address:  [facility]. [facility], [place] [address], [place], [place] [address]      Provider Number: [id number]  Attending Physician Name and Address:  Mercy Riding, MD  Relative Name and Phone Number:  Mailyn, Steichen , spouse, [phone]    Current Level of Care: Hospital Recommended Level of Care: [place] Prior Approval Number:    Date Approved/Denied:   PASRR Number:    Discharge Plan:      Current Diagnoses: Patient Active Problem List   Diagnosis Date Noted  . Acute CHF (congestive heart failure) ([place]) 07/22/2020  . Acute systolic CHF (congestive heart failure) ([place]) 07/22/2020  . Closed fracture of first lumbar vertebra ([place])   . Discitis   . 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 Bacteremia due to Enterococcus 07/09/2020  . Pressure injury of skin 07/05/2020  . Protein-calorie malnutrition, severe 07/05/2020  . Acute lower UTI 07/05/2020  . Compression fracture of L1 vertebra ([place]) 07/05/2020  . Fall at home, initial encounter 07/04/2020  . Fall 07/04/2020  . AKI (acute kidney injury) ([place])   . Transaminitis   . Allergic rhinitis due to pollen 06/08/2020  . Occult blood in stools 06/08/2020  . Gastro-esophageal reflux disease without esophagitis 06/08/2020  . Vasomotor rhinitis 06/08/2020  . Wheezing 06/08/2020  . Diarrhea 10/27/2019  . Gastric polyp   . Intervertebral disc rupture 12/27/2018  . Lymphocytic colitis 09/15/2018  . B12 deficiency 06/29/2018  . Persistent atrial fibrillation ([place]) 04/05/2018  . Loose stools 07/06/2017  . Rectal bleeding 07/06/2017  . Generalized abdominal pain 07/06/2017  . Cervical spondylosis 03/11/2017  . Loss of weight 11/18/2013  . Nausea  alone 11/18/2013  . Abdominal pain, epigastric 11/18/2013  . Anemia, iron deficiency 01/27/2013  . Temporomandibular joint-pain-dysfunction syndrome (TMJ) 06/28/2012  . Other abnormal glucose 08/25/2011  . Allergic rhinitis, cause unspecified 08/25/2011  . Right-sided chest wall pain 08/25/2011  . Murmur 09/20/2010  . Incisional hernia 09/20/2010  . HERNIATED DISC 01/02/2010  . CAROTID ARTERY DISEASE 08/27/2009  . Osteoporosis 06/07/2009  . BRADYCARDIA 02/05/2009  . BENIGN POSITIONAL VERTIGO 09/27/2008  . CELIAC ARTERY COMPRESSION SYNDROME 10/25/2007  . Hypothyroidism 01/08/2007  . Hyperlipidemia 01/08/2007  . Essential hypertension 01/08/2007  . Coronary atherosclerosis 01/08/2007  . PERIPHERAL VASCULAR DISEASE 01/08/2007  . ATROPHIC GASTRITIS 01/08/2007  . ANEMIA, PERNICIOUS, HX OF 01/08/2007  . RENAL CALCULUS, HX OF 01/08/2007  . HYSTERECTOMY, HX OF 01/08/2007  . APPENDECTOMY, HX OF 01/08/2007    Orientation RESPIRATION BLADDER Height & Weight     Self,Time,Situation,Place      Weight:   Height:     BEHAVIORAL SYMPTOMS/MOOD NEUROLOGICAL BOWEL NUTRITION STATUS        Diet (See DC Summary)  AMBULATORY STATUS COMMUNICATION OF NEEDS Skin   Extensive Assist                           Personal Care Assistance Level of Assistance  Dressing,Feeding,Bathing Bathing Assistance: Maximum assistance Feeding assistance: Independent       Functional Limitations Info  Sight,Hearing,Speech Sight Info: Adequate Hearing Info: Adequate Speech Info: Adequate    SPECIAL CARE FACTORS FREQUENCY  Contractures Contractures Info: Not present    Additional Factors Info                  Current Medications (07/23/2020):  This is the current hospital active medication list Current Facility-Administered Medications  Medication Dose Route Frequency Provider Last Rate Last Admin  . acetaminophen (TYLENOL) tablet 650 mg  650 mg Oral Q6H PRN  Rise Patience, MD       Or  . acetaminophen (TYLENOL) suppository 650 mg  650 mg Rectal Q6H PRN Rise Patience, MD      . ampicillin (OMNIPEN) 2 g in sodium chloride 0.9 % 100 mL IVPB  2 g Intravenous Q8H Rise Patience, MD 300 mL/hr at 07/23/20 1033 2 g at 07/23/20 1033  . apixaban (ELIQUIS) tablet 2.5 mg  2.5 mg Oral BID Rise Patience, MD   2.5 mg at 07/23/20 1038  . atorvastatin (LIPITOR) tablet 40 mg  40 mg Oral QHS Rise Patience, MD   40 mg at 07/23/20 0015  . cefTRIAXone (ROCEPHIN) 2 g in sodium chloride 0.9 % 100 mL IVPB  2 g Intravenous Q12H Rise Patience, MD 200 mL/hr at 07/23/20 1118 2 g at 07/23/20 1118  . Chlorhexidine Gluconate Cloth 2 % PADS 6 each  6 each Topical Daily Gonfa, Taye T, MD      . furosemide (LASIX) injection 40 mg  40 mg Intravenous Q12H Rise Patience, MD   40 mg at 07/23/20 0646  . hyoscyamine (LEVSIN SL) SL tablet 0.125 mg  0.125 mg Sublingual Daily PRN Rise Patience, MD      . lactase (LACTAID) tablet 3,000 Units  3,000 Units Oral TID WC PRN Rise Patience, MD      . lactose free nutrition (BOOST PLUS) liquid 237 mL  237 mL Oral TID WC Rise Patience, MD   237 mL at 07/23/20 1038  . [START ON 07/24/2020] levothyroxine (SYNTHROID) tablet 75 mcg  75 mcg Oral Q0600 Gonfa, Taye T, MD      . lidocaine (LIDODERM) 5 % 1 patch  1 patch Transdermal Daily PRN Rise Patience, MD      . lidocaine (LMX) 4 % cream   Topical TID PRN Rise Patience, MD      . oxyCODONE (Oxy IR/ROXICODONE) immediate release tablet 5 mg  5 mg Oral Q6H PRN Rise Patience, MD      . polyethylene glycol (MIRALAX / GLYCOLAX) packet 17 g  17 g Oral Daily Rise Patience, MD   17 g at 07/23/20 1038  . saccharomyces boulardii (FLORASTOR) capsule 250 mg  250 mg Oral Daily PRN Rise Patience, MD      . sucralfate (CARAFATE) 1 GM/10ML suspension 1 g  1 g Oral BID PRN Rise Patience, MD         Discharge  Medications: Please see discharge summary for a list of discharge medications.  Relevant Imaging Results:  Relevant Lab Results:   Additional Information SS# [id number], [place] COVID-19 Vaccine 04/19/2020 , 05/30/2019 , 05/09/2019  Reece Agar, LCSWA

## 2020-07-23 NOTE — TOC Progression Note (Addendum)
Transition of Care [facility]) - Progression Note    Patient Details  Name: [name] MRN: [id number] Date of Birth: May 12, 1932  [facility]) CM/SW Contact  Zenon Mayo, RN Phone Number: 07/23/2020, 1:55 PM  Clinical Narrative:    NCM spoke with patient and spouse at bedside, patient states she is from [facility] and would like to go back, she was there for a week for short term therapy.  She states her spouse will place a bed hold for her there.  She wears a whole body brace when ambulating. Her spouse will bring it to the hospital for her.  CSW informed of this information.       Expected Discharge Plan and Services                                                 Social Determinants of Health (SDOH) Interventions    Readmission Risk Interventions No flowsheet data found.

## 2020-07-23 NOTE — Progress Notes (Signed)
  Echocardiogram 2D Echocardiogram has been performed.  [name] 07/23/2020, 11:04 AM

## 2020-07-23 NOTE — Progress Notes (Signed)
Lower extremity venous bilateral study completed.   Please see CV Proc for preliminary results.   Gabryelle Whitmoyer, RDMS, RVT  

## 2020-07-23 NOTE — Progress Notes (Signed)
PROGRESS NOTE  [name] LKT:[id number] DOB: 03-31-1933   PCP: [name], MD  Patient is from: SNF  DOA: 07/22/2020 LOS: 1  Chief complaints: Shortness of breath and leg swelling  Brief Narrative / Interim history: 87 year old F with PMH of chronic systolic CHF, CAD/CABG, chronic A. fib on Eliquis, hypothyroidism, aortic valve for mitral valve regurgitation, chronic back pain, DJD, ambulatory dysfunction,and recent hospitalization from 3/9-3/18 with Enterococcus bacteremia, aortic valve endocarditis and possible T12-L1 discitis returning with shortness of breath and leg swelling, and admitted for acute on chronic systolic CHF and hypothyroidism with possible myxedema.  BNP elevated to 640.  CXR showed cardiomegaly with a small to moderate bilateral pleural effusion and bibasilar opacities concerning for pulmonary edema.  TSH 65.  She was a started on IV Lasix.  Synthroid increased to 75 mcg the next day.   Subjective: Seen and examined earlier this morning.  No major events overnight or this morning.  Reports improvement in her leg swelling and breathing after IV Lasix.  She denies chest pain, dizziness, palpitation, GI or UTI symptoms.  She is not sure if she was getting her Synthroid at SNF.  Objective: Vitals:   07/22/20 2227 07/22/20 2300 07/23/20 0300 07/23/20 0800  BP:  (!) 131/58 (!) 110/45 (!) 119/42  Pulse:  80 60 (!) 58  Resp:  20 19 16   Temp: 98 F (36.7 C)  98 F (36.7 C) 98.2 F (36.8 C)  TempSrc: Oral  Oral Oral  SpO2:  96% 98% 100%   No intake or output data in the 24 hours ending 07/23/20 1109 There were no vitals filed for this visit.  Examination:  GENERAL: Frail looking elderly female.  No apparent distress. HEENT: MMM.  Vision and hearing grossly intact.  NECK: Supple.  No apparent JVD.  RESP: 96% on 3 L.  No IWOB.  Bibasilar crackles. CVS:  RRR. Heart sounds normal.  ABD/GI/GU: BS+. Abd soft, NTND.  MSK/EXT:  Moves extremities.  2+ pitting  edema and bulla in BLE SKIN: Bulla over dorsal aspect of both feet NEURO: Awake, alert and oriented appropriately.  No apparent focal neuro deficit. PSYCH: Calm. Normal affect.   Procedures:  None  Microbiology summarized: [id number] and influenza PCR nonreactive. MRSA PCR nonreactive.  Assessment & Plan: Acute on chronic systolic CHF-TTE on 05/12/7260 with LVEF of 40 to 45%, R WMA, RVSP of 46.6, moderate LAE, moderate to severe MVR and AVR.  On Lasix 40 mg daily at home.  Exacerbation could be due to hypothyroidism/myxedema.  BNP elevated.  CXR consistent with CHF.  Has 2+ pitting edema with bulla.  Started on IV Lasix.  Reports good urine output although I&O was not captured.  Renal function improved. -Continue IV Lasix 40 mg twice daily -Treat hypothyroidism/myxedema as below -May resume home Aldactone if hypokalemia -Monitor fluid status and renal function -Follow echocardiogram and LE [place] -Wean oxygen as able -Sodium and fluid restriction  Elevated troponin: Likely demand ischemia.  No chest pain or acute EKG findings -Manage CHF as above  Hypothyroidism/myxedema: TSH 65, and 60 on repeat.  She is not sure if she has been getting his Synthroid.  If she has been getting it, not sure why she is getting it in the evening -Increase Synthroid from 50 to 75 mcg daily at 6 AM -Repeat TSH in 1 to 2 weeks to see trend  AKI: Resolved. Recent Labs    07/05/20 0355 07/06/20 0052 07/07/20 9741 07/08/20 0109 07/09/20 0054 07/11/20 0059  07/12/20 0715 07/13/20 0057 07/22/20 1927 07/23/20 0812  BUN 37* 41* 39* 37* 31* 26* 21 20 11 11   CREATININE 1.25* 1.34* 1.31* 1.30* 1.23* 1.18* 1.13* 1.05* 1.22* 0.82  -Continue monitoring  Enterococcus bacteremia with aortic valve endocarditis and possible T12-L1 discitis -Resumed ceftriaxone and ampicillin-plan was for 6 weeks starting 07/09/2020  Anemia of chronic disease: H&H relatively stable. Recent Labs    07/06/20 0052 07/07/20 0358  07/08/20 0109 07/09/20 0054 07/10/20 0112 07/11/20 0059 07/12/20 0120 07/13/20 0057 07/22/20 1927 07/23/20 0812  HGB 10.0* 11.0* 10.0* 9.8* 9.7* 8.7* 9.4* 9.2* 10.0* 9.1*  -Continue monitoring  History of CAD/CABG: Denies chest pain -Continue home medications  Paroxysmal A. Fib: Not on rate or rhythm control due to bradycardia -Continue home Eliquis  History of atrophic gastritis -Continue PPI and Carafate  DDD/chronic back pain -Lidoderm patch, as needed Tylenol and oxycodone  Lactose intolerance: -Continue Lactaid and lactose-free nutrition  Debility/physical deconditioning -PT/OT eval   Severe malnutrition There is no height or weight on file to calculate BMI.  -Consult dietitian    Pressure skin injury: POA Pressure Injury 07/05/20 Coccyx Mid;Bilateral Stage 1 -  Intact skin with non-blanchable redness of a localized area usually over a bony prominence. RED/NON BLANCHING (Active)  07/05/20 0352  Location: Coccyx  Location Orientation: Mid;Bilateral  Staging: Stage 1 -  Intact skin with non-blanchable redness of a localized area usually over a bony prominence.  Wound Description (Comments): RED/NON BLANCHING  Present on Admission: Yes   DVT prophylaxis:  apixaban (ELIQUIS) tablet 2.5 mg Start: 07/22/20 2215 apixaban (ELIQUIS) tablet 2.5 mg  Code Status: DNR/DNI Family Communication: Patient and/or RN. Available if any question.  Level of care: Telemetry Cardiac Status is: Inpatient  Remains inpatient appropriate because:Ongoing diagnostic testing needed not appropriate for outpatient work up, Unsafe d/c plan, IV treatments appropriate due to intensity of illness or inability to take PO and Inpatient level of care appropriate due to severity of illness   Dispo: The patient is from: SNF              Anticipated d/c is to: To be determined              Patient currently is not medically stable to d/c.   Difficult to place patient No       Consultants:   None   Sch Meds:  Scheduled Meds: . apixaban  2.5 mg Oral BID  . atorvastatin  40 mg Oral QHS  . furosemide  40 mg Intravenous Q12H  . lactose free nutrition  237 mL Oral TID WC  . [START ON 07/24/2020] levothyroxine  75 mcg Oral Q0600  . polyethylene glycol  17 g Oral Daily   Continuous Infusions: . ampicillin IVPB 2 gram/NS 100 mL (Mini-Bag Plus) 2 g (07/23/20 1033)  . cefTRIAXone (ROCEPHIN) IVPB 2 gram/100 mL NS (Mini-Bag Plus) 2 g (07/23/20 0034)   PRN Meds:.acetaminophen **OR** acetaminophen, hyoscyamine, lactase, lidocaine, lidocaine, oxyCODONE, saccharomyces boulardii, sucralfate  Antimicrobials: Anti-infectives (From admission, onward)   Start     Dose/Rate Route Frequency Ordered Stop   07/22/20 2315  ampicillin (OMNIPEN) 2 g in sodium chloride 0.9 % 100 mL IVPB       Note to Pharmacy: 6 weeks from 07/09/2020.     2 g 300 mL/hr over 20 Minutes Intravenous Every 8 hours 07/22/20 2209     07/22/20 2215  cefTRIAXone (ROCEPHIN) 2 g in sodium chloride 0.9 % 100 mL IVPB  Note to Pharmacy: 6 weeks from 07/09/2020.     2 g 200 mL/hr over 30 Minutes Intravenous Every 12 hours 07/22/20 2209         I have personally reviewed the following labs and images: CBC: Recent Labs  Lab 07/22/20 1927 07/23/20 0812  WBC 10.8* 8.8  NEUTROABS 9.4*  --   HGB 10.0* 9.1*  HCT 31.7* 27.7*  MCV 87.3 85.0  PLT 219 201   BMP &GFR Recent Labs  Lab 07/22/20 1927 07/23/20 0812  NA 136 138  K 3.6 3.7  CL 106 103  CO2 24 30  GLUCOSE 127* 84  BUN 11 11  CREATININE 1.22* 0.82  CALCIUM 7.6* 7.5*  MG  --  1.8  PHOS  --  2.7   CrCl cannot be calculated (Unknown ideal weight.). Liver & Pancreas: Recent Labs  Lab 07/22/20 1927 07/23/20 0812  AST 36  --   ALT 32  --   ALKPHOS 78  --   BILITOT 0.5  --   PROT 5.5*  --   ALBUMIN 2.0* 1.9*   No results for input(s): LIPASE, AMYLASE in the last 168 hours. No results for input(s): AMMONIA in the last 168 hours. Diabetic: No  results for input(s): HGBA1C in the last 72 hours. No results for input(s): GLUCAP in the last 168 hours. Cardiac Enzymes: No results for input(s): CKTOTAL, CKMB, CKMBINDEX, TROPONINI in the last 168 hours. No results for input(s): PROBNP in the last 8760 hours. Coagulation Profile: No results for input(s): INR, PROTIME in the last 168 hours. Thyroid Function Tests: Recent Labs    07/23/20 0812  TSH 60.606*   Lipid Profile: No results for input(s): CHOL, HDL, LDLCALC, TRIG, CHOLHDL, LDLDIRECT in the last 72 hours. Anemia Panel: No results for input(s): VITAMINB12, FOLATE, FERRITIN, TIBC, IRON, RETICCTPCT in the last 72 hours. Urine analysis:    Component Value Date/Time   COLORURINE STRAW (A) 07/22/2020 1910   APPEARANCEUR CLEAR 07/22/2020 1910   LABSPEC 1.008 07/22/2020 1910   PHURINE 5.0 07/22/2020 1910   GLUCOSEU NEGATIVE 07/22/2020 1910   GLUCOSEU NEGATIVE 06/22/2013 1619   HGBUR NEGATIVE 07/22/2020 1910   HGBUR negative 07/30/2009 1606   BILIRUBINUR NEGATIVE 07/22/2020 1910   BILIRUBINUR negative 12/05/2019 1535   KETONESUR NEGATIVE 07/22/2020 1910   PROTEINUR NEGATIVE 07/22/2020 1910   UROBILINOGEN 0.2 12/05/2019 1535   UROBILINOGEN 0.2 06/22/2013 1619   NITRITE NEGATIVE 07/22/2020 1910   LEUKOCYTESUR NEGATIVE 07/22/2020 1910   Sepsis Labs: Invalid input(s): PROCALCITONIN, [place]  Microbiology: Recent Results (from the past 240 hour(s))  Resp Panel by RT-PCR (Flu A&B, Covid) Nasopharyngeal Swab     Status: None   Collection Time: 07/22/20  7:24 PM   Specimen: Nasopharyngeal Swab; Nasopharyngeal(NP) swabs in vial transport medium  Result Value Ref Range Status   SARS Coronavirus 2 by RT PCR NEGATIVE NEGATIVE Final    Comment: (NOTE) SARS-CoV-2 target nucleic acids are NOT DETECTED.  The SARS-CoV-2 RNA is generally detectable in upper respiratory specimens during the acute phase of infection. The lowest concentration of SARS-CoV-2 viral copies this assay  can detect is 138 copies/mL. A negative result does not preclude SARS-Cov-2 infection and should not be used as the sole basis for treatment or other patient management decisions. A negative result may occur with  improper specimen collection/handling, submission of specimen other than nasopharyngeal swab, presence of viral mutation(s) within the areas targeted by this assay, and inadequate number of viral copies(<138 copies/mL). A negative result must be  combined with clinical observations, patient history, and epidemiological information. The expected result is Negative.  Fact Sheet for Patients:  EntrepreneurPulse.com.au  Fact Sheet for Healthcare Providers:  IncredibleEmployment.be  This test is no t yet approved or cleared by the [place] FDA and  has been authorized for detection and/or diagnosis of SARS-CoV-2 by FDA under an Emergency Use Authorization (EUA). This EUA will remain  in effect (meaning this test can be used) for the duration of the COVID-19 declaration under Section 564(b)(1) of the Act, 21 U.S.C.section 360bbb-3(b)(1), unless the authorization is terminated  or revoked sooner.       Influenza A by PCR NEGATIVE NEGATIVE Final   Influenza B by PCR NEGATIVE NEGATIVE Final    Comment: (NOTE) The Xpert Xpress SARS-CoV-2/FLU/RSV plus assay is intended as an aid in the diagnosis of influenza from Nasopharyngeal swab specimens and should not be used as a sole basis for treatment. Nasal washings and aspirates are unacceptable for Xpert Xpress SARS-CoV-2/FLU/RSV testing.  Fact Sheet for Patients: EntrepreneurPulse.com.au  Fact Sheet for Healthcare Providers: IncredibleEmployment.be  This test is not yet approved or cleared by the [place] FDA and has been authorized for detection and/or diagnosis of SARS-CoV-2 by FDA under an Emergency Use Authorization (EUA). This EUA will  remain in effect (meaning this test can be used) for the duration of the COVID-19 declaration under Section 564(b)(1) of the Act, 21 U.S.C. section 360bbb-3(b)(1), unless the authorization is terminated or revoked.  Performed at [facility], [place] [address], [place], [place] [address]   MRSA PCR Screening     Status: None   Collection Time: 07/22/20 10:56 PM   Specimen: Nasal Mucosa; Nasopharyngeal  Result Value Ref Range Status   MRSA by PCR NEGATIVE NEGATIVE Final    Comment:        The GeneXpert MRSA Assay (FDA approved for NASAL specimens only), is one component of a comprehensive MRSA colonization surveillance program. It is not intended to diagnose MRSA infection nor to guide or monitor treatment for MRSA infections. Performed at [facility], [place] [address] [place],  [address]     Radiology Studies: DG Chest Port 1 View  Result Date: 07/22/2020 CLINICAL DATA:  Shortness of breath EXAM: PORTABLE CHEST 1 VIEW COMPARISON:  Chest radiograph July 04, 2020 and chest CT Sep 23, 2005 FINDINGS: Cardiomegaly, similar prior. Small to moderate bilateral pleural effusions with bibasilar airspace opacities. No visible pneumothorax. Changes of prior median sternotomy and CABG. The visualized skeletal structures are unchanged. IMPRESSION: 1. Cardiomegaly with small to moderate bilateral pleural effusions and bibasilar airspace opacities, likely pulmonary edema. Electronically Signed   By: Dahlia Bailiff MD   On: 07/22/2020 19:24      Amador Braddy T. Turin  If 7PM-7AM, please contact night-coverage [email or web address] 07/23/2020, 11:09 AM

## 2020-07-23 NOTE — TOC Initial Note (Addendum)
Transition of Care [facility]) - Initial/Assessment Note    Patient Details  Name: [name] MRN: [id number] Date of Birth: 10-27-1932  Transition of Care [facility] CM/SW Contact:    Tresa Endo Phone Number: 07/23/2020, 3:45 PM  Clinical Narrative:                 4:10pm- CSW spoke with Olivia Mackie at [facility], she is agreeable with pt coming back at [facility]. Pt will need a new insurance auth and COVID prior to return. CSW will follow up with pt and family.  3:00pm- Pt wants to go back to [facility] at discharge, CSW contacted [facility] and they were not available. CSW is waiting on call back to confirm with facility.  Expected Discharge Plan: Skilled Nursing Facility Barriers to Discharge: Continued Medical Work up   Patient Goals and CMS Choice Patient states their goals for this hospitalization and ongoing recovery are:: Rehab CMS Medicare.gov Compare Post Acute Care list provided to:: Patient Choice offered to / list presented to : Patient  Expected Discharge Plan and Services Expected Discharge Plan: [place] In-house Referral: Clinical Social Work   Post Acute Care Choice: [place] Living arrangements for the past 2 months: [place]                                      Prior Living Arrangements/Services Living arrangements for the past 2 months: [place] Lives with:: Spouse Patient language and need for interpreter reviewed:: Yes Do you feel safe going back to the place where you live?: Yes      Need for Family Participation in Patient Care: Yes (Comment) Care giver support system in place?: Yes (comment) Current home services: Other (comment) Criminal Activity/Legal Involvement Pertinent to Current Situation/Hospitalization: No - Comment as needed  Activities of Daily Living   ADL Screening (condition at time of admission) Patient's cognitive ability adequate to  safely complete daily activities?: Yes Is the patient deaf or have difficulty hearing?: No Does the patient have difficulty seeing, even when wearing glasses/contacts?: No Does the patient have difficulty concentrating, remembering, or making decisions?: No Does the patient have difficulty dressing or bathing?: No Does the patient have difficulty walking or climbing stairs?: No  Permission Sought/Granted Permission sought to share information with : Family Chief Financial Officer                Emotional Assessment Appearance:: Appears stated age       Alcohol / Substance Use: Not Applicable Psych Involvement: No (comment)  Admission diagnosis:  Acute CHF (congestive heart failure) (HCC) [H41.9] Acute systolic CHF (congestive heart failure) ([place]) [I50.21] Patient Active Problem List   Diagnosis Date Noted  . Acute CHF (congestive heart failure) ([place]) 07/22/2020  . Acute systolic CHF (congestive heart failure) ([place]) 07/22/2020  . Closed fracture of first lumbar vertebra ([place])   . Discitis   . 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 Bacteremia due to Enterococcus 07/09/2020  . Pressure injury of skin 07/05/2020  . Protein-calorie malnutrition, severe 07/05/2020  . Acute lower UTI 07/05/2020  . Compression fracture of L1 vertebra ([place]) 07/05/2020  . Fall at home, initial encounter 07/04/2020  . Fall 07/04/2020  . AKI (acute kidney injury) ([place])   . Transaminitis   . Allergic rhinitis due to pollen 06/08/2020  . Occult blood in stools 06/08/2020  .  Gastro-esophageal reflux disease without esophagitis 06/08/2020  . Vasomotor rhinitis 06/08/2020  . Wheezing 06/08/2020  . Diarrhea 10/27/2019  . Gastric polyp   . Intervertebral disc rupture 12/27/2018  . Lymphocytic colitis 09/15/2018  . B12 deficiency 06/29/2018  . Persistent atrial fibrillation ([place]) 04/05/2018  . Loose stools 07/06/2017  . Rectal bleeding 07/06/2017  . Generalized abdominal pain  07/06/2017  . Cervical spondylosis 03/11/2017  . Loss of weight 11/18/2013  . Nausea alone 11/18/2013  . Abdominal pain, epigastric 11/18/2013  . Anemia, iron deficiency 01/27/2013  . Temporomandibular joint-pain-dysfunction syndrome (TMJ) 06/28/2012  . Other abnormal glucose 08/25/2011  . Allergic rhinitis, cause unspecified 08/25/2011  . Right-sided chest wall pain 08/25/2011  . Murmur 09/20/2010  . Incisional hernia 09/20/2010  . HERNIATED DISC 01/02/2010  . CAROTID ARTERY DISEASE 08/27/2009  . Osteoporosis 06/07/2009  . BRADYCARDIA 02/05/2009  . BENIGN POSITIONAL VERTIGO 09/27/2008  . CELIAC ARTERY COMPRESSION SYNDROME 10/25/2007  . Hypothyroidism 01/08/2007  . Hyperlipidemia 01/08/2007  . Essential hypertension 01/08/2007  . Coronary atherosclerosis 01/08/2007  . PERIPHERAL VASCULAR DISEASE 01/08/2007  . ATROPHIC GASTRITIS 01/08/2007  . ANEMIA, PERNICIOUS, HX OF 01/08/2007  . RENAL CALCULUS, HX OF 01/08/2007  . HYSTERECTOMY, HX OF 01/08/2007  . APPENDECTOMY, HX OF 01/08/2007   PCP:  [name], MD Pharmacy:  No Pharmacies Listed    Social Determinants of Health (SDOH) Interventions    Readmission Risk Interventions No flowsheet data found.

## 2020-07-24 ENCOUNTER — Ambulatory Visit: Payer: Medicare Other | Admitting: Infectious Disease

## 2020-07-24 DIAGNOSIS — I509 Heart failure, unspecified: Secondary | ICD-10-CM

## 2020-07-24 DIAGNOSIS — J9601 Acute respiratory failure with hypoxia: Secondary | ICD-10-CM

## 2020-07-24 LAB — CBC
HCT: 26.8 % — ABNORMAL LOW (ref 36.0–46.0)
Hemoglobin: 8.8 g/dL — ABNORMAL LOW (ref 12.0–15.0)
MCH: 27.8 pg (ref 26.0–34.0)
MCHC: 32.8 g/dL (ref 30.0–36.0)
MCV: 84.8 fL (ref 80.0–100.0)
Platelets: 211 10*3/uL (ref 150–400)
RBC: 3.16 MIL/uL — ABNORMAL LOW (ref 3.87–5.11)
RDW: 18.6 % — ABNORMAL HIGH (ref 11.5–15.5)
WBC: 8.1 10*3/uL (ref 4.0–10.5)
nRBC: 0 % (ref 0.0–0.2)

## 2020-07-24 LAB — RENAL FUNCTION PANEL
Albumin: 1.8 g/dL — ABNORMAL LOW (ref 3.5–5.0)
Anion gap: 4 — ABNORMAL LOW (ref 5–15)
BUN: 10 mg/dL (ref 8–23)
CO2: 32 mmol/L (ref 22–32)
Calcium: 7.1 mg/dL — ABNORMAL LOW (ref 8.9–10.3)
Chloride: 103 mmol/L (ref 98–111)
Creatinine, Ser: 0.85 mg/dL (ref 0.44–1.00)
GFR, Estimated: >60 mL/min (ref >60)
Glucose, Bld: 73 mg/dL (ref 70–99)
Phosphorus: 2.5 mg/dL (ref 2.5–4.6)
Potassium: 3.8 mmol/L (ref 3.5–5.1)
Sodium: 139 mmol/L (ref 135–145)

## 2020-07-24 LAB — MAGNESIUM: Magnesium: 1.7 mg/dL (ref 1.7–2.4)

## 2020-07-24 MED ORDER — SODIUM CHLORIDE 0.9 % IV SOLN
2.0000 g | Freq: Four times a day (QID) | INTRAVENOUS | Status: DC
  Administered 2020-07-24 – 2020-07-27 (×13): 2 g via INTRAVENOUS
  Filled 2020-07-24: qty 2
  Filled 2020-07-24 (×3): qty 2000
  Filled 2020-07-24: qty 2
  Filled 2020-07-24 (×2): qty 2000
  Filled 2020-07-24: qty 2
  Filled 2020-07-24 (×3): qty 2000
  Filled 2020-07-24 (×2): qty 2
  Filled 2020-07-24 (×3): qty 2000

## 2020-07-24 MED ORDER — TRAZODONE HCL 50 MG PO TABS
25.0000 mg | ORAL_TABLET | Freq: Every day | ORAL | Status: DC
  Administered 2020-07-24 – 2020-07-27 (×4): 25 mg via ORAL
  Filled 2020-07-24 (×4): qty 1

## 2020-07-24 MED ORDER — ADULT MULTIVITAMIN W/MINERALS CH
1.0000 | ORAL_TABLET | Freq: Every day | ORAL | Status: DC
  Administered 2020-07-24 – 2020-07-28 (×5): 1 via ORAL
  Filled 2020-07-24 (×5): qty 1

## 2020-07-24 NOTE — Progress Notes (Addendum)
Progress Note    [name]  WRU:[id number] DOB: 1932/07/03  DOA: 07/22/2020 PCP: [name], MD    Brief Narrative:     Medical records reviewed and are as summarized below:  [name] is an 87 y.o. female with PMH of chronic systolic CHF, CAD/CABG, chronic A. fib on Eliquis, hypothyroidism, aortic valve for mitral valve regurgitation, chronic back pain, DJD, ambulatory dysfunction,and recent hospitalization from 3/9-3/18 with Enterococcus bacteremia, aortic valve endocarditis and possible T12-L1 discitis returning with shortness of breath and leg swelling, and admitted for acute on chronic systolic CHF and hypothyroidism with possible myxedema.  BNP elevated to 640.  CXR showed cardiomegaly with a small to moderate bilateral pleural effusion and bibasilar opacities concerning for pulmonary edema.  TSH 65.  She was a started on IV Lasix.  Synthroid increased to 75 mcg the next day.   Assessment/Plan:   Active Problems:   Essential hypertension   Persistent atrial fibrillation (HCC)   Lymphocytic colitis   Bacteremia due to Enterococcus   Discitis   Acute CHF (congestive heart failure) (HCC)   Acute systolic CHF (congestive heart failure) (HCC)   Acute on chronic systolic CHF-TTE on 01/10/7828 with LVEF of 40 to 45%, R WMA, RVSP of 46.6, moderate LAE, moderate to severe MVR and AVR.  On Lasix 40 mg daily at home.  Exacerbation could be due to hypothyroidism/myxedema.  BNP elevated.  CXR consistent with CHF.  Has 2+ pitting edema with bulla.   -strict I/O - IV Lasix 40 mg twice daily -Treat hypothyroidism/myxedema as below -Monitor fluid status and renal function - LE US show chronic clot -Wean oxygen as able -Sodium and fluid restriction  Elevated troponin: Likely demand ischemia.  No chest pain or acute EKG findings -Manage CHF as above  Hypothyroidism/myxedema: TSH 65, and 60 on repeat.  She is not sure if she has been getting his Synthroid.  If she  has been getting it, not sure why she is getting it in the evening -Increase Synthroid from 50 to 75 mcg daily at 6 AM -Repeat TSH in 4 weeks  AKI: Resolved. -resolved  Enterococcus bacteremia with aortic valve endocarditis and possible T12-L1 discitis -Resumed ceftriaxone and ampicillin-plan was for 6 weeks starting 07/09/2020  Anemia of chronic disease: H&H relatively stable. -Continue monitoring  History of CAD/CABG: Denies chest pain -Continue home medications  Paroxysmal A. Fib: Not on rate or rhythm control due to bradycardia -Continue home Eliquis  History of atrophic gastritis -Continue PPI and Carafate  DDD/chronic back pain -Lidoderm patch, as needed Tylenol and oxycodone  Lactose intolerance: -Continue Lactaid and lactose-free nutrition  Debility/physical deconditioning -PT/OT eval -from SNF ([facility])  Pressure skin injury: POA Pressure Injury 07/05/20 Coccyx Mid;Bilateral Stage 1 -  Intact skin with non-blanchable redness of a localized area usually over a bony prominence. RED/NON BLANCHING (Active)  07/05/20 0352  Location: Coccyx  Location Orientation: Mid;Bilateral  Staging: Stage 1 -  Intact skin with non-blanchable redness of a localized area usually over a bony prominence.  Wound Description (Comments): RED/NON BLANCHING  Present on Admission: Yes      Family Communication/Anticipated D/C date and plan/Code Status   DVT prophylaxis: eliquis  Code Status: DNR Disposition Plan: Status is: Inpatient  Remains inpatient appropriate because:Inpatient level of care appropriate due to severity of illness   Dispo: The patient is from: SNF              Anticipated d/c is to: SNF  Patient currently is not medically stable to d/c.   Difficult to place patient No         Medical Consultants:    None.     Subjective:   Feeling better overall No nausea or vomiting  Objective:    Vitals:   07/23/20 1900 07/23/20  2000 07/23/20 2300 07/24/20 0122  BP: (!) 126/48 (!) 123/44 (!) 117/42   Pulse: 60 (!) 58 (!) 56   Resp: 19 18 13    Temp: 98.5 F (36.9 C)  98.7 F (37.1 C)   TempSrc:   Oral   SpO2: 98% 100% 99% 97%  Weight:    54 kg  Height:        Intake/Output Summary (Last 24 hours) at 07/24/2020 0942 Last data filed at 07/24/2020 0109 Gross per 24 hour  Intake 780 ml  Output 1400 ml  Net -620 ml   Filed Weights   07/23/20 1841 07/24/20 0122  Weight: 50.4 kg 54 kg    Exam:  General: Appearance:    elderly female in no acute distress     Lungs:      respirations unlabored  Heart:  bradycardic  MS:   All extremities are intact. Intact blisters on b/l LE extremities  Neurologic:   Awake, alert, oriented x 3    Data Reviewed:   I have personally reviewed following labs and imaging studies:  Labs: Labs show the following:   Basic Metabolic Panel: Recent Labs  Lab 07/22/20 1927 07/23/20 0812 07/24/20 0455  NA 136 138 139  K 3.6 3.7 3.8  CL 106 103 103  CO2 24 30 32  GLUCOSE 127* 84 73  BUN 11 11 10   CREATININE 1.22* 0.82 0.85  CALCIUM 7.6* 7.5* 7.1*  MG  --  1.8 1.7  PHOS  --  2.7 2.5   GFR Estimated Creatinine Clearance: 40 mL/min (by C-G formula based on SCr of 0.85 mg/dL). Liver Function Tests: Recent Labs  Lab 07/22/20 1927 07/23/20 0812 07/24/20 0455  AST 36  --   --   ALT 32  --   --   ALKPHOS 78  --   --   BILITOT 0.5  --   --   PROT 5.5*  --   --   ALBUMIN 2.0* 1.9* 1.8*   No results for input(s): LIPASE, AMYLASE in the last 168 hours. No results for input(s): AMMONIA in the last 168 hours. Coagulation profile No results for input(s): INR, PROTIME in the last 168 hours.  CBC: Recent Labs  Lab 07/22/20 1927 07/23/20 0812 07/24/20 0455  WBC 10.8* 8.8 8.1  NEUTROABS 9.4*  --   --   HGB 10.0* 9.1* 8.8*  HCT 31.7* 27.7* 26.8*  MCV 87.3 85.0 84.8  PLT 219 201 211   Cardiac Enzymes: No results for input(s): CKTOTAL, CKMB, CKMBINDEX, TROPONINI  in the last 168 hours. BNP (last 3 results) No results for input(s): PROBNP in the last 8760 hours. CBG: Recent Labs  Lab 07/23/20 2142  GLUCAP 92   D-Dimer: No results for input(s): DDIMER in the last 72 hours. Hgb A1c: No results for input(s): HGBA1C in the last 72 hours. Lipid Profile: No results for input(s): CHOL, HDL, LDLCALC, TRIG, CHOLHDL, LDLDIRECT in the last 72 hours. Thyroid function studies: Recent Labs    07/23/20 0812  TSH 60.606*   Anemia work up: No results for input(s): VITAMINB12, FOLATE, FERRITIN, TIBC, IRON, RETICCTPCT in the last 72 hours. Sepsis Labs: Recent Labs  Lab  07/22/20 1927 07/23/20 0812 07/24/20 0455  WBC 10.8* 8.8 8.1    Microbiology Recent Results (from the past 240 hour(s))  Resp Panel by RT-PCR (Flu A&B, Covid) Nasopharyngeal Swab     Status: None   Collection Time: 07/22/20  7:24 PM   Specimen: Nasopharyngeal Swab; Nasopharyngeal(NP) swabs in vial transport medium  Result Value Ref Range Status   SARS Coronavirus 2 by RT PCR NEGATIVE NEGATIVE Final    Comment: (NOTE) SARS-CoV-2 target nucleic acids are NOT DETECTED.  The SARS-CoV-2 RNA is generally detectable in upper respiratory specimens during the acute phase of infection. The lowest concentration of SARS-CoV-2 viral copies this assay can detect is 138 copies/mL. A negative result does not preclude SARS-Cov-2 infection and should not be used as the sole basis for treatment or other patient management decisions. A negative result may occur with  improper specimen collection/handling, submission of specimen other than nasopharyngeal swab, presence of viral mutation(s) within the areas targeted by this assay, and inadequate number of viral copies(<138 copies/mL). A negative result must be combined with clinical observations, patient history, and epidemiological information. The expected result is Negative.  Fact Sheet for Patients:   EntrepreneurPulse.com.au  Fact Sheet for Healthcare Providers:  IncredibleEmployment.be  This test is no t yet approved or cleared by the [place] FDA and  has been authorized for detection and/or diagnosis of SARS-CoV-2 by FDA under an Emergency Use Authorization (EUA). This EUA will remain  in effect (meaning this test can be used) for the duration of the COVID-19 declaration under Section 564(b)(1) of the Act, 21 U.S.C.section 360bbb-3(b)(1), unless the authorization is terminated  or revoked sooner.       Influenza A by PCR NEGATIVE NEGATIVE Final   Influenza B by PCR NEGATIVE NEGATIVE Final    Comment: (NOTE) The Xpert Xpress SARS-CoV-2/FLU/RSV plus assay is intended as an aid in the diagnosis of influenza from Nasopharyngeal swab specimens and should not be used as a sole basis for treatment. Nasal washings and aspirates are unacceptable for Xpert Xpress SARS-CoV-2/FLU/RSV testing.  Fact Sheet for Patients: EntrepreneurPulse.com.au  Fact Sheet for Healthcare Providers: IncredibleEmployment.be  This test is not yet approved or cleared by the [place] FDA and has been authorized for detection and/or diagnosis of SARS-CoV-2 by FDA under an Emergency Use Authorization (EUA). This EUA will remain in effect (meaning this test can be used) for the duration of the COVID-19 declaration under Section 564(b)(1) of the Act, 21 U.S.C. section 360bbb-3(b)(1), unless the authorization is terminated or revoked.  Performed at [facility], [place] [address], [place], [place] [address]   MRSA PCR Screening     Status: None   Collection Time: 07/22/20 10:56 PM   Specimen: Nasal Mucosa; Nasopharyngeal  Result Value Ref Range Status   MRSA by PCR NEGATIVE NEGATIVE Final    Comment:        The GeneXpert MRSA Assay (FDA approved for NASAL specimens only), is one component of a comprehensive MRSA  colonization surveillance program. It is not intended to diagnose MRSA infection nor to guide or monitor treatment for MRSA infections. Performed at [facility], [place] [address], [place], [place] [address]     Procedures and diagnostic studies:  DG Chest Port 1 View  Result Date: 07/22/2020 CLINICAL DATA:  Shortness of breath EXAM: PORTABLE CHEST 1 VIEW COMPARISON:  Chest radiograph July 04, 2020 and chest CT Sep 23, 2005 FINDINGS: Cardiomegaly, similar prior. Small to moderate bilateral pleural effusions with  bibasilar airspace opacities. No visible pneumothorax. Changes of prior median sternotomy and CABG. The visualized skeletal structures are unchanged. IMPRESSION: 1. Cardiomegaly with small to moderate bilateral pleural effusions and bibasilar airspace opacities, likely pulmonary edema. Electronically Signed   By: Dahlia Bailiff MD   On: 07/22/2020 19:24   ECHOCARDIOGRAM COMPLETE  Result Date: 07/23/2020    ECHOCARDIOGRAM REPORT   Patient Name:   KATESHA EICHEL Date of Exam: 07/23/2020 Medical Rec #:  [id number]        Height:       64.0 in Accession #:    [id number]       Weight:       95.7 lb Date of Birth:  04/17/1933         BSA:          1.430 m Patient Age:    33 years         BP:           119/42 mmHg Patient Gender: F                HR:           58 bpm. Exam Location:  Inpatient Procedure: 2D Echo, Cardiac Doppler and Color Doppler Indications:    Congestive Heart Failure I50.9  History:        Patient has prior history of Echocardiogram examinations, most                 recent 07/07/2020. CAD; Risk Factors:Hypertension and                 Dyslipidemia.  Sonographer:    Bernadene Person RDCS Referring Phys: [place]  1. Left ventricular ejection fraction, by estimation, is 50%. The left ventricle has mildly decreased function. The left ventricle demonstrates regional wall motion abnormalities, septal-lateral dyssychrony consistent with LBBB and mild  septal hypokinesis. Left ventricular diastolic parameters are consistent with Grade II diastolic dysfunction (pseudonormalization).  2. Right ventricular systolic function is mildly reduced. The right ventricular size is normal. There is mildly elevated pulmonary artery systolic pressure.  3. There is a small (0.5 x 0.9 cm) mobile vegetation noted on the ventricular size of the aortic valve. The aortic valve is tricuspid. Aortic valve regurgitation is moderate to severe and eccentric, possible holodiastolic flow reversal in descending thoracic aorta (signal not great). No aortic stenosis is present.  4. The mitral valve is abnormal. Severe mitral valve regurgitation, eccentric anterior jet likely due to P2 prolapse vs partial flail. No evidence of mitral stenosis.  5. Left atrial size was mildly dilated.  6. The inferior vena cava is normal in size with greater than 50% respiratory variability, suggesting right atrial pressure of 3 mmHg.  7. Pleural effusion noted on left.  8. Impression: Aortic valve endocarditis, mod-severe aortic insufficiency, severe MR. FINDINGS  Left Ventricle: Left ventricular ejection fraction, by estimation, is 50%. The left ventricle has mildly decreased function. The left ventricle demonstrates regional wall motion abnormalities. The left ventricular internal cavity size was normal in size. There is no left ventricular hypertrophy. Left ventricular diastolic parameters are consistent with Grade II diastolic dysfunction (pseudonormalization). Right Ventricle: The right ventricular size is normal. No increase in right ventricular wall thickness. Right ventricular systolic function is mildly reduced. There is mildly elevated pulmonary artery systolic pressure. The tricuspid regurgitant velocity  is 2.98 m/s, and with an assumed right atrial pressure of 3 mmHg, the estimated right ventricular systolic pressure is  38.5 mmHg. Left Atrium: Left atrial size was mildly dilated. Right Atrium: Right  atrial size was normal in size. Pericardium: Pleural effusion noted on left. There is no evidence of pericardial effusion. Mitral Valve: The mitral valve is abnormal. Severe mitral valve regurgitation. No evidence of mitral valve stenosis. MV peak gradient, 7.4 mmHg. The mean mitral valve gradient is 3.0 mmHg. Tricuspid Valve: The tricuspid valve is normal in structure. Tricuspid valve regurgitation is trivial. Aortic Valve: There is a small (0.5 x 0.9 cm) mobile vegetation noted on the ventricular size of the aortic valve. The aortic valve is tricuspid. Aortic valve regurgitation is moderate to severe. Aortic regurgitation PHT measures 490 msec. No aortic stenosis is present. Pulmonic Valve: The pulmonic valve was normal in structure. Pulmonic valve regurgitation is not visualized. Aorta: The aortic root is normal in size and structure. Venous: The inferior vena cava is normal in size with greater than 50% respiratory variability, suggesting right atrial pressure of 3 mmHg. IAS/Shunts: No atrial level shunt detected by color flow Doppler.  LEFT VENTRICLE PLAX 2D LVIDd:         3.30 cm     Diastology LVIDs:         2.60 cm     LV e' medial:    3.49 cm/s LV PW:         0.90 cm     LV E/e' medial:  40.7 LV IVS:        1.00 cm     LV e' lateral:   4.50 cm/s LVOT diam:     1.60 cm     LV E/e' lateral: 31.6 LV SV:         67 LV SV Index:   47 LVOT Area:     2.01 cm  LV Volumes (MOD) LV vol d, MOD A2C: 82.3 ml LV vol d, MOD A4C: 79.8 ml LV vol s, MOD A2C: 41.9 ml LV vol s, MOD A4C: 39.5 ml LV SV MOD A2C:     40.4 ml LV SV MOD A4C:     79.8 ml LV SV MOD BP:      41.2 ml RIGHT VENTRICLE RV S prime:     8.93 cm/s TAPSE (M-mode): 1.2 cm LEFT ATRIUM             Index       RIGHT ATRIUM           Index LA diam:        2.90 cm 2.03 cm/m  RA Area:     13.10 cm LA Vol (A2C):   41.4 ml 28.95 ml/m RA Volume:   27.40 ml  19.16 ml/m LA Vol (A4C):   43.8 ml 30.63 ml/m LA Biplane Vol: 45.6 ml 31.89 ml/m  AORTIC VALVE LVOT Vmax:    142.00 cm/s LVOT Vmean:  98.400 cm/s LVOT VTI:    0.334 m AI PHT:      490 msec  AORTA Ao Root diam: 2.50 cm Ao Asc diam:  2.70 cm MITRAL VALVE                TRICUSPID VALVE MV Area (PHT): 3.21 cm     TR Peak grad:   35.5 mmHg MV Area VTI:   1.63 cm     TR Vmax:        298.00 cm/s MV Peak grad:  7.4 mmHg MV Mean grad:  3.0 mmHg     SHUNTS MV Vmax:  1.36 m/s     Systemic VTI:  0.33 m MV Vmean:      85.8 cm/s    Systemic Diam: 1.60 cm MV Decel Time: 236 msec MR PISA:        1.01 cm MR PISA Radius: 0.40 cm MV E velocity: 142.00 cm/s MV A velocity: 94.70 cm/s MV E/A ratio:  1.50 Loralie Champagne MD Electronically signed by Loralie Champagne MD Signature Date/Time: 07/23/2020/11:32:24 AM    Final    VAS [place] LOWER EXTREMITY VENOUS (DVT)  Result Date: 07/23/2020  Lower Venous DVT Study Indications: Edema, SOB.  Comparison Study: No prior studies. Performing Technologist: Darlin Coco RDMS,RVT  Examination Guidelines: A complete evaluation includes B-mode imaging, spectral Doppler, color Doppler, and power Doppler as needed of all accessible portions of each vessel. Bilateral testing is considered an integral part of a complete examination. Limited examinations for reoccurring indications may be performed as noted. The reflux portion of the exam is performed with the patient in reverse Trendelenburg.  +---------+---------------+---------+-----------+----------+--------------+ RIGHT    CompressibilityPhasicitySpontaneityPropertiesThrombus Aging +---------+---------------+---------+-----------+----------+--------------+ CFV      Partial        Yes      Yes                  Chronic        +---------+---------------+---------+-----------+----------+--------------+ SFJ      Full                                                        +---------+---------------+---------+-----------+----------+--------------+ FV Prox  Full                                                         +---------+---------------+---------+-----------+----------+--------------+ FV Mid   Full                                                        +---------+---------------+---------+-----------+----------+--------------+ FV DistalFull                                                        +---------+---------------+---------+-----------+----------+--------------+ PFV      Full                                                        +---------+---------------+---------+-----------+----------+--------------+ POP      Full           Yes      Yes                                 +---------+---------------+---------+-----------+----------+--------------+ PTV      Full                                                        +---------+---------------+---------+-----------+----------+--------------+  PERO     Full                                                        +---------+---------------+---------+-----------+----------+--------------+   +---------+---------------+---------+-----------+----------+--------------+ LEFT     CompressibilityPhasicitySpontaneityPropertiesThrombus Aging +---------+---------------+---------+-----------+----------+--------------+ CFV      Full           Yes      Yes                                 +---------+---------------+---------+-----------+----------+--------------+ SFJ      Full                                                        +---------+---------------+---------+-----------+----------+--------------+ FV Prox  Full                                                        +---------+---------------+---------+-----------+----------+--------------+ FV Mid   Full                                                        +---------+---------------+---------+-----------+----------+--------------+ FV DistalFull                                                         +---------+---------------+---------+-----------+----------+--------------+ PFV      Full                                                        +---------+---------------+---------+-----------+----------+--------------+ POP      Full           Yes      Yes                                 +---------+---------------+---------+-----------+----------+--------------+ PTV      Full                                                        +---------+---------------+---------+-----------+----------+--------------+ PERO     Full                                                        +---------+---------------+---------+-----------+----------+--------------+  Summary: RIGHT: - Findings consistent with chronic, non-occlusive 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 - No cystic structure found in the popliteal fossa.  LEFT: - There is no evidence of deep vein thrombosis in the lower extremity.  - No cystic structure found in the popliteal fossa.  *See table(s) above for measurements and observations.    Preliminary     Medications:   . apixaban  2.5 mg Oral BID  . atorvastatin  40 mg Oral QHS  . Chlorhexidine Gluconate Cloth  6 each Topical Daily  . furosemide  40 mg Intravenous Q12H  . lactose free nutrition  237 mL Oral TID WC  . levothyroxine  75 mcg Oral Q0600  . polyethylene glycol  17 g Oral Daily   Continuous Infusions: . ampicillin IVPB 2 gram/NS 100 mL (Mini-Bag Plus) 2 g (07/24/20 0930)  . cefTRIAXone (ROCEPHIN) IVPB 2 gram/100 mL NS (Mini-Bag Plus) Stopped (07/24/20 0650)     LOS: 2 days   Geradine Girt  [facility]   How to contact the [facility] Attending or Consulting provider [place] or covering provider during after hours [place], for this patient?  1. Check the care team in [facility] and look for a) attending/consulting TRH provider listed and b) the [facility] team listed 2. Log into [email or web address] and use [place]'s universal password  to access. If you do not have the password, please contact the hospital operator. 3. Locate the [facility] provider you are looking for under [facility] and page to a number that you can be directly reached. 4. If you still have difficulty reaching the provider, please page the [facility] (Director on Call) for the Hospitalists listed on amion for assistance.  07/24/2020, 9:42 AM

## 2020-07-24 NOTE — Care Management Important Message (Signed)
Important Message  Patient Details  Name: [name] MRN: [id number] Date of Birth: 10/21/1932   Medicare Important Message Given:  Yes     Orbie Pyo 07/24/2020, 2:26 PM

## 2020-07-24 NOTE — Progress Notes (Signed)
PHARMACY NOTE:  ANTIMICROBIAL RENAL DOSAGE ADJUSTMENT  Current antimicrobial regimen includes a mismatch between antimicrobial dosage and estimated renal function.  As per policy approved by the Pharmacy & Therapeutics and Medical Executive Committees, the antimicrobial dosage will be adjusted accordingly.  Current antimicrobial dosage:  Ampicillin 2g q8h  Indication: bacteremia  Renal Function:  Estimated Creatinine Clearance: 40 mL/min (by C-G formula based on SCr of 0.85 mg/dL). []      On intermittent HD, scheduled: []      On CRRT    Antimicrobial dosage has been changed to:  Ampicillin 2g q6h  Additional comments:   Thank you for allowing pharmacy to be a part of this patient's care.  Arrie Senate, PharmD, BCPS, [facility] Clinical Pharmacist [phone] Please check AMION for all [place] numbers 07/24/2020

## 2020-07-24 NOTE — Progress Notes (Signed)
Heart Failure Nurse Navigator Progress Note  Will follow this hospitalization to determine if pt appropriate for HF screening. Pt from SNF.   Pricilla Holm, RN, BSN Heart Failure Nurse Navigator [phone]

## 2020-07-24 NOTE — Plan of Care (Signed)
  Problem: Education: Goal: Knowledge of General Education information will improve Description: Including pain rating scale, medication(s)/side effects and non-pharmacologic comfort measures 07/24/2020 0334 by Thressa Sheller, RN Outcome: Progressing 07/24/2020 0334 by Thressa Sheller, RN Outcome: Progressing   Problem: Health Behavior/Discharge Planning: Goal: Ability to manage health-related needs will improve Outcome: Progressing   Problem: Clinical Measurements: Goal: Ability to maintain clinical measurements within normal limits will improve Outcome: Progressing Goal: Will remain free from infection Outcome: Progressing Goal: Diagnostic test results will improve Outcome: Progressing Goal: Respiratory complications will improve 07/24/2020 0334 by Thressa Sheller, RN Outcome: Progressing 07/24/2020 0334 by Thressa Sheller, RN Outcome: Progressing Goal: Cardiovascular complication will be avoided 07/24/2020 0334 by Thressa Sheller, RN Outcome: Progressing 07/24/2020 0334 by Thressa Sheller, RN Outcome: Progressing   Problem: Activity: Goal: Risk for activity intolerance will decrease Outcome: Progressing   Problem: Nutrition: Goal: Adequate nutrition will be maintained Outcome: Progressing   Problem: Coping: Goal: Level of anxiety will decrease Outcome: Progressing   Problem: Elimination: Goal: Will not experience complications related to bowel motility 07/24/2020 0334 by Thressa Sheller, RN Outcome: Progressing 07/24/2020 0334 by Thressa Sheller, RN Outcome: Progressing Goal: Will not experience complications related to urinary retention 07/24/2020 0334 by Thressa Sheller, RN Outcome: Progressing 07/24/2020 0334 by Thressa Sheller, RN Outcome: Progressing   Problem: Pain Managment: Goal: General experience of comfort will improve 07/24/2020 0334 by Thressa Sheller, RN Outcome: Progressing 07/24/2020 0334 by Thressa Sheller,  RN Outcome: Progressing   Problem: Safety: Goal: Ability to remain free from injury will improve Outcome: Progressing   Problem: Skin Integrity: Goal: Risk for impaired skin integrity will decrease 07/24/2020 0334 by Thressa Sheller, RN Outcome: Progressing 07/24/2020 0334 by Thressa Sheller, RN Outcome: Progressing

## 2020-07-24 NOTE — Progress Notes (Signed)
Initial Nutrition Assessment  DOCUMENTATION CODES:   Severe malnutrition in context of chronic illness  INTERVENTION:   Boost Plus po TID, each supplement provides 360 kcal and 14 grams of protein  MVI with Minerals   NUTRITION DIAGNOSIS:   Severe Malnutrition related to chronic illness as evidenced by severe fat depletion,severe muscle depletion.  GOAL:   Patient will meet greater than or equal to 90% of their needs  MONITOR:   PO intake,Supplement acceptance,Labs,Weight trends  REASON FOR ASSESSMENT:   Malnutrition Screening Tool    ASSESSMENT:   87 yo female admitted with acute on chronic CHF, AKI. PMH includes HTN, CHF, lymphocytic colitis, bacteremia due to enterococcus, atrophic gastritis   Pt reports appetite is not good at present. Pt reports she vomited the other night and has not wanting to eat much. Recorded po intake 40% at breakfast  Pt reports PTA admission and after last discharge, pt reports she was eating well. Eating 3 meals per day. Pt also drinks 1 to 2 Boost daily and has done so for a while.   Pt unsure about her weight currently; reports weight gain prior to admission due to significant LE edema. Pt reports edema has improved significantly since admission. Admission weight 119 pounds (54 kg). Pt reports UBW around 100 pounds and pt reports she has weighed as little as 90 pounds. Current weight does not appear accurate (unless fluid related) given pt weighed 43.4 kg 3/13 with weights between 39-43 kg since October 2021  Noted pt with large blister on top of right foot; patient reports the blisters have been popping up on both legs  Labs: reviewed Meds: lasix, lactaid prn, florastor   NUTRITION - FOCUSED PHYSICAL EXAM:  Flowsheet Row Most Recent Value  Orbital Region Moderate depletion  Upper Arm Region Severe depletion  Thoracic and Lumbar Region Severe depletion  Buccal Region Moderate depletion  Temple Region Severe depletion  Clavicle  Bone Region Severe depletion  Clavicle and Acromion Bone Region Severe depletion  Scapular Bone Region Severe depletion  Dorsal Hand Moderate depletion  Patellar Region Severe depletion  Anterior Thigh Region Severe depletion  Posterior Calf Region Severe depletion       Diet Order:   Diet Order            Diet Heart Room service appropriate? Yes; Fluid consistency: Thin; Fluid restriction: 1200 mL Fluid  Diet effective now                 EDUCATION NEEDS:   Education needs have been addressed  Skin:  Skin Integrity Issues:: Stage I Stage I: coccyx  Last BM:  3/29  Height:   Ht Readings from Last 1 Encounters:  07/23/20 5\' 3"  (1.6 m)    Weight:   Wt Readings from Last 1 Encounters:  07/24/20 54 kg     BMI:  Body mass index is 21.09 kg/m.  Estimated Nutritional Needs:   Kcal:  1550-1750 kcals  Protein:  80-90 g  Fluid:  >/= 1.5 L    Kerman Passey MS, RDN, LDN, CNSC Registered Dietitian III Clinical Nutrition RD Pager and On-Call Pager Number Located in [place]

## 2020-07-25 DIAGNOSIS — I4819 Other persistent atrial fibrillation: Secondary | ICD-10-CM

## 2020-07-25 DIAGNOSIS — R7881 Bacteremia: Secondary | ICD-10-CM

## 2020-07-25 DIAGNOSIS — M4645 Discitis, unspecified, thoracolumbar region: Secondary | ICD-10-CM

## 2020-07-25 DIAGNOSIS — I5041 Acute combined systolic (congestive) and diastolic (congestive) heart failure: Secondary | ICD-10-CM

## 2020-07-25 DIAGNOSIS — I10 Essential (primary) hypertension: Secondary | ICD-10-CM

## 2020-07-25 DIAGNOSIS — B952 Enterococcus as the cause of diseases classified elsewhere: Secondary | ICD-10-CM

## 2020-07-25 LAB — CBC
HCT: 27.5 % — ABNORMAL LOW (ref 36.0–46.0)
Hemoglobin: 8.8 g/dL — ABNORMAL LOW (ref 12.0–15.0)
MCH: 27.3 pg (ref 26.0–34.0)
MCHC: 32 g/dL (ref 30.0–36.0)
MCV: 85.4 fL (ref 80.0–100.0)
Platelets: 195 10*3/uL (ref 150–400)
RBC: 3.22 MIL/uL — ABNORMAL LOW (ref 3.87–5.11)
RDW: 18 % — ABNORMAL HIGH (ref 11.5–15.5)
WBC: 7.3 10*3/uL (ref 4.0–10.5)
nRBC: 0 % (ref 0.0–0.2)

## 2020-07-25 LAB — BASIC METABOLIC PANEL
Anion gap: 6 (ref 5–15)
BUN: 9 mg/dL (ref 8–23)
CO2: 33 mmol/L — ABNORMAL HIGH (ref 22–32)
Calcium: 7.6 mg/dL — ABNORMAL LOW (ref 8.9–10.3)
Chloride: 100 mmol/L (ref 98–111)
Creatinine, Ser: 0.84 mg/dL (ref 0.44–1.00)
GFR, Estimated: >60 mL/min (ref >60)
Glucose, Bld: 75 mg/dL (ref 70–99)
Potassium: 3.3 mmol/L — ABNORMAL LOW (ref 3.5–5.1)
Sodium: 139 mmol/L (ref 135–145)

## 2020-07-25 LAB — MAGNESIUM: Magnesium: 1.7 mg/dL (ref 1.7–2.4)

## 2020-07-25 MED ORDER — POTASSIUM CHLORIDE CRYS ER 20 MEQ PO TBCR
40.0000 meq | EXTENDED_RELEASE_TABLET | Freq: Once | ORAL | Status: AC
  Administered 2020-07-25: 40 meq via ORAL
  Filled 2020-07-25: qty 2

## 2020-07-25 NOTE — NC FL2 (Signed)
[place] LEVEL OF CARE SCREENING TOOL     IDENTIFICATION  Patient Name: [name] Birthdate: [birth year] Sex: female Admission Date (Current Location): 07/22/2020  [facility] [place] Number:  Herbalist and Address:  [facility], [place] [address], [name], [place] [address]      Provider Number: [id number]  Attending Physician Name and Address:  Cristal Ford, DO  Relative Name and Phone Number:  Lamerle, Jabs , spouse, [phone]    Current Level of Care: Hospital Recommended Level of Care: [place] Prior Approval Number:    Date Approved/Denied:   PASRR Number: [id number]  Discharge Plan: SNF    Current Diagnoses: Patient Active Problem List   Diagnosis Date Noted  . Acute CHF (congestive heart failure) ([place]) 07/22/2020  . Acute systolic CHF (congestive heart failure) ([place]) 07/22/2020  . Closed fracture of first lumbar vertebra ([place])   . Discitis   . 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 Bacteremia due to Enterococcus 07/09/2020  . Pressure injury of skin 07/05/2020  . Protein-calorie malnutrition, severe 07/05/2020  . Acute lower UTI 07/05/2020  . Compression fracture of L1 vertebra ([place]) 07/05/2020  . Fall at home, initial encounter 07/04/2020  . Fall 07/04/2020  . AKI (acute kidney injury) ([place])   . Transaminitis   . Allergic rhinitis due to pollen 06/08/2020  . Occult blood in stools 06/08/2020  . Gastro-esophageal reflux disease without esophagitis 06/08/2020  . Vasomotor rhinitis 06/08/2020  . Wheezing 06/08/2020  . Diarrhea 10/27/2019  . Gastric polyp   . Intervertebral disc rupture 12/27/2018  . Lymphocytic colitis 09/15/2018  . B12 deficiency 06/29/2018  . Persistent atrial fibrillation ([place]) 04/05/2018  . Loose stools 07/06/2017  . Rectal bleeding 07/06/2017  . Generalized abdominal pain 07/06/2017  . Cervical spondylosis 03/11/2017  . Loss of weight 11/18/2013   . Nausea alone 11/18/2013  . Abdominal pain, epigastric 11/18/2013  . Anemia, iron deficiency 01/27/2013  . Temporomandibular joint-pain-dysfunction syndrome (TMJ) 06/28/2012  . Other abnormal glucose 08/25/2011  . Allergic rhinitis, cause unspecified 08/25/2011  . Right-sided chest wall pain 08/25/2011  . Murmur 09/20/2010  . Incisional hernia 09/20/2010  . HERNIATED DISC 01/02/2010  . CAROTID ARTERY DISEASE 08/27/2009  . Osteoporosis 06/07/2009  . BRADYCARDIA 02/05/2009  . BENIGN POSITIONAL VERTIGO 09/27/2008  . CELIAC ARTERY COMPRESSION SYNDROME 10/25/2007  . Hypothyroidism 01/08/2007  . Hyperlipidemia 01/08/2007  . Essential hypertension 01/08/2007  . Coronary atherosclerosis 01/08/2007  . PERIPHERAL VASCULAR DISEASE 01/08/2007  . ATROPHIC GASTRITIS 01/08/2007  . ANEMIA, PERNICIOUS, HX OF 01/08/2007  . RENAL CALCULUS, HX OF 01/08/2007  . HYSTERECTOMY, HX OF 01/08/2007  . APPENDECTOMY, HX OF 01/08/2007    Orientation RESPIRATION BLADDER Height & Weight     Self,Time,Situation,Place  O2 (Nasal cannula 1L) Continent,External catheter Weight: 103 lb 13.4 oz (47.1 kg) Height:  5\' 3"  (160 cm)  BEHAVIORAL SYMPTOMS/MOOD NEUROLOGICAL BOWEL NUTRITION STATUS      Incontinent Diet (See DC Summary)  AMBULATORY STATUS COMMUNICATION OF NEEDS Skin   Limited Assist Verbally PU Stage and Appropriate Care (Stage I on coccyx)                       Personal Care Assistance Level of Assistance  Dressing,Feeding,Bathing Bathing Assistance: Limited assistance Feeding assistance: Independent Dressing Assistance: Limited assistance     Functional Limitations Info  Sight,Hearing,Speech Sight Info: Adequate Hearing Info: Adequate Speech Info: Adequate    SPECIAL CARE FACTORS  FREQUENCY  PT (By licensed PT),OT (By licensed OT)     PT Frequency: 5x/week OT Frequency: 5x/week            Contractures Contractures Info: Not present    Additional Factors Info  Code  Status,Allergies Code Status Info: DNR Allergies Info: Actos (Pioglitazone), Sulfonamide Derivatives, Adhesive (Tape), Codeine           Current Medications (07/25/2020):  This is the current hospital active medication list Current Facility-Administered Medications  Medication Dose Route Frequency Provider Last Rate Last Admin  . acetaminophen (TYLENOL) tablet 650 mg  650 mg Oral Q6H PRN Rise Patience, MD       Or  . acetaminophen (TYLENOL) suppository 650 mg  650 mg Rectal Q6H PRN Rise Patience, MD      . ampicillin (OMNIPEN) 2 g in sodium chloride 0.9 % 100 mL IVPB  2 g Intravenous Q6H Einar Grad, RPH 300 mL/hr at 07/25/20 1437 2 g at 07/25/20 1437  . apixaban (ELIQUIS) tablet 2.5 mg  2.5 mg Oral BID Rise Patience, MD   2.5 mg at 07/25/20 0855  . atorvastatin (LIPITOR) tablet 40 mg  40 mg Oral QHS Rise Patience, MD   40 mg at 07/24/20 2117  . cefTRIAXone (ROCEPHIN) 2 g in sodium chloride 0.9 % 100 mL IVPB  2 g Intravenous Q12H Rise Patience, MD 200 mL/hr at 07/25/20 0954 2 g at 07/25/20 0954  . Chlorhexidine Gluconate Cloth 2 % PADS 6 each  6 each Topical Daily Mercy Riding, MD   6 each at 07/25/20 0854  . furosemide (LASIX) injection 40 mg  40 mg Intravenous Q12H Rise Patience, MD   40 mg at 07/25/20 0530  . hyoscyamine (LEVSIN SL) SL tablet 0.125 mg  0.125 mg Sublingual Daily PRN Rise Patience, MD      . lactase (LACTAID) tablet 3,000 Units  3,000 Units Oral TID WC PRN Rise Patience, MD      . lactose free nutrition (BOOST PLUS) liquid 237 mL  237 mL Oral TID WC Rise Patience, MD   237 mL at 07/25/20 1151  . levothyroxine (SYNTHROID) tablet 75 mcg  75 mcg Oral Q0600 Mercy Riding, MD   75 mcg at 07/25/20 0530  . lidocaine (LIDODERM) 5 % 1 patch  1 patch Transdermal Daily PRN Rise Patience, MD      . lidocaine (LMX) 4 % cream   Topical TID PRN Rise Patience, MD      . multivitamin with minerals tablet 1  tablet  1 tablet Oral Daily Eulogio Bear U, DO   1 tablet at 07/25/20 0854  . oxyCODONE (Oxy IR/ROXICODONE) immediate release tablet 5 mg  5 mg Oral Q6H PRN Rise Patience, MD   5 mg at 07/24/20 0529  . polyethylene glycol (MIRALAX / GLYCOLAX) packet 17 g  17 g Oral Daily Rise Patience, MD   17 g at 07/24/20 0936  . saccharomyces boulardii (FLORASTOR) capsule 250 mg  250 mg Oral Daily PRN Rise Patience, MD      . sucralfate (CARAFATE) 1 GM/10ML suspension 1 g  1 g Oral BID PRN Rise Patience, MD      . traZODone (DESYREL) tablet 25 mg  25 mg Oral QHS Eulogio Bear U, DO   25 mg at 07/24/20 2117     Discharge Medications: Please see discharge summary for a list of discharge medications.  Relevant  Imaging Results:  Relevant Lab Results:   Additional Information SS# [id number], [place] COVID-19 Vaccine 04/19/2020 , 05/30/2019 , 05/09/2019  Benard Halsted, LCSW

## 2020-07-25 NOTE — Progress Notes (Addendum)
PROGRESS NOTE    [name]  NIO:[id number] DOB: [birth year] DOA: 07/22/2020 PCP: [name], MD   Brief Narrative:  HPI On 07/22/2020 by Dr. Gean Birchwood [name] is a 87 y.o. female with known history of atrial fibrillation, hypothyroidism, atrophic gastritis who was recently admitted and discharged on July 13, 2020 after being diagnosed with enterococcal bacteremia and aortic valve endocarditis with possible T12/L1 discitis has been experiencing shortness of breath and increasing peripheral edema over the last 3 days.  Patient was placed on compression stocking and Lasix despite which patient was getting more short of breath was referred to the ER.  Denies any chest pain productive cough fever or chills.  Interim history Patient was recently admitted from [id number] with Enterococcus bacteremia, aortic valve endocarditis with possible T12-L1 discitis.  She returns with shortness of breath, leg swelling is admitted with acute on chronic systolic CHF exacerbation and hypothyroidism with possible myxedema.  Patient started on IV Lasix as well as her increased dose of Synthroid. Assessment & Plan   Acute on chronic combined systolic and diastolic CHF exacerbation with acute respiratory failure -Patient presented with shortness of breath as well as lower extremity swelling -BNP 640.7 -Chest x-ray showed cardiomegaly with small to moderate bilateral pleural effusions and bibasilar airspace opacities, likely pulmonary edema -Echocardiogram 07/06/2020 showed an EF of 40 to 45%, LV with regional wall motion abnormalities.  Abnormal septal motion, consistent with left bundle branch block.  Mild LVH.  Diastolic parameters indeterminate.  Moderately elevated pulmonary artery pressure. -Repeat echocardiogram on 3/28 shows an EF of 81%, grade 2 diastolic dysfunction -Patient was placed on 5 L of nasal cannula on admission, however has been weaned to 2 L.  Does not wear oxygen at home.   Will order ambulatory oxygen saturation. -Continue IV Lasix 40 mg twice daily -Monitor intake and output, daily weights  Elevated troponin -Suspect secondary to the above -Likely demand -Currently no chest pain or changes on EKG. -Echocardiogram as above  Hypothyroidism/myxedema -TSH of 65, repeat 60 -Synthroid dose increased to 75 mcg at 6 AM.  Seems that patient was receiving the medication in the evening -Question whether she was receiving this medication appropriately -Repeat TSH in 4 weeks  Chronic kidney disease, stage IIIa -Upon chart review, GFR in stage IIIa since 2019 -Continue to monitor creatinine closely  Enterococcus bacteremia/aortic valve endocarditis with possible T12-L1 discitis -Echocardiogram showed Small (0.5 x 0.9 cm) mobile vegetation on ventricular size of aortic valve. -Patient was started on ceftriaxone ampicillin with plans for 6 weeks starting on 07/09/2020  Anemia of chronic disease -Hemoglobin appears stable, continue to monitor CBC  History of CAD/CABG -Currently denies any chest pain  -Continue statin, currently on Eliquis  Paroxysmal atrial fibrillation -Currently not in rhythm control due to bradycardia -Continue Eliquis  History of atrophic gastritis -Continue PPI and Carafate  Degenerative disc disease/chronic back pain -Continue Lidoderm patch, Tylenol and oxycodone as needed  Lactose intolerance -Continue Lactaid, lactose-free nutrition  Physical deconditioning -Patient is from [place] -Pending PT and OT evaluations  Pressure skin injury/lower extremity wounds/blistering -Present on admission -Patient with bilateral mid coccyx stage I pressure injury -Continue wound care  Severe malnutrition -BMI of 18.39 -In the context of chronic ill -Nutrition consulted and -Continue nutritional supplements and multivitamin  Hypokalemia -Likely secondary to diuresis -Will obtain magnesium level -Will supplement  continue to monitor BMP  DVT Prophylaxis Eliquis  Code Status: DNR  Family Communication: None at bedside  Disposition Plan:  Status is: Inpatient  Remains inpatient appropriate because:IV treatments appropriate due to intensity of illness or inability to take PO and Inpatient level of care appropriate due to severity of illness   Dispo: The patient is from: SNF              Anticipated d/c is to: SNF              Patient currently is not medically stable to d/c.   Difficult to place patient No  Consultants None  Procedures  Echocardiogram  Antibiotics   Anti-infectives (From admission, onward)   Start     Dose/Rate Route Frequency Ordered Stop   07/24/20 0900  ampicillin (OMNIPEN) 2 g in sodium chloride 0.9 % 100 mL IVPB       Note to Pharmacy: 6 weeks from 07/09/2020.     2 g 300 mL/hr over 20 Minutes Intravenous Every 6 hours 07/24/20 0713     07/22/20 2315  ampicillin (OMNIPEN) 2 g in sodium chloride 0.9 % 100 mL IVPB  Status:  Discontinued       Note to Pharmacy: 6 weeks from 07/09/2020.     2 g 300 mL/hr over 20 Minutes Intravenous Every 8 hours 07/22/20 2209 07/24/20 0713   07/22/20 2215  cefTRIAXone (ROCEPHIN) 2 g in sodium chloride 0.9 % 100 mL IVPB       Note to Pharmacy: 6 weeks from 07/09/2020.     2 g 200 mL/hr over 30 Minutes Intravenous Every 12 hours 07/22/20 2209        Subjective:   [name] seen and examined today.  Patient continues to have shortness of breath.  States she does not use oxygen at home.  Would like to get out of bed if possible.  Does complain of blisters on her legs.  Currently denies chest pain, abdominal pain, nausea or vomiting, diarrhea or constipation, dizziness or headache.   Objective:   Vitals:   07/25/20 0400 07/25/20 0500 07/25/20 0833 07/25/20 1132  BP: (!) 128/50  (!) 129/40 (!) 124/43  Pulse: 60  63 (!) 58  Resp: 17  18 19   Temp: 98.4 F (36.9 C)  98.2 F (36.8 C) 98.6 F (37 C)  TempSrc: Oral  Oral Oral   SpO2: 96%  98% 97%  Weight:  47.1 kg    Height:        Intake/Output Summary (Last 24 hours) at 07/25/2020 1236 Last data filed at 07/25/2020 1221 Gross per 24 hour  Intake 720 ml  Output 3951 ml  Net -3231 ml   Filed Weights   07/23/20 1841 07/24/20 0122 07/25/20 0500  Weight: 50.4 kg 54 kg 47.1 kg    Exam  General: Well developed, chronically ill-appearing, NAD  HEENT: NCAT, mucous membranes moist.   Cardiovascular: S1 S2 auscultated, RRR  Respiratory: Diminished breath sounds  Abdomen: Soft, nontender, nondistended, + bowel sounds  Extremities: warm dry without cyanosis clubbing.  LE edema bilaterally, left greater than right.  Neuro: AAOx3, nonfocal  Skin: Lower extremity with several dressings in place, patient with large blister noted on dorsal aspect of feet bilaterally  Psych: pleasant, appropriate mood and affect   Data Reviewed: I have personally reviewed following labs and imaging studies  CBC: Recent Labs  Lab 07/22/20 1927 07/23/20 0812 07/24/20 0455 07/25/20 0545  WBC 10.8* 8.8 8.1 7.3  NEUTROABS 9.4*  --   --   --   HGB 10.0* 9.1* 8.8* 8.8*  HCT 31.7*  27.7* 26.8* 27.5*  MCV 87.3 85.0 84.8 85.4  PLT 219 201 211 967   Basic Metabolic Panel: Recent Labs  Lab 07/22/20 1927 07/23/20 0812 07/24/20 0455 07/25/20 0545  NA 136 138 139 139  K 3.6 3.7 3.8 3.3*  CL 106 103 103 100  CO2 24 30 32 33*  GLUCOSE 127* 84 73 75  BUN 11 11 10 9   CREATININE 1.22* 0.82 0.85 0.84  CALCIUM 7.6* 7.5* 7.1* 7.6*  MG  --  1.8 1.7  --   PHOS  --  2.7 2.5  --    GFR: Estimated Creatinine Clearance: 36.4 mL/min (by C-G formula based on SCr of 0.84 mg/dL). Liver Function Tests: Recent Labs  Lab 07/22/20 1927 07/23/20 0812 07/24/20 0455  AST 36  --   --   ALT 32  --   --   ALKPHOS 78  --   --   BILITOT 0.5  --   --   PROT 5.5*  --   --   ALBUMIN 2.0* 1.9* 1.8*   No results for input(s): LIPASE, AMYLASE in the last 168 hours. No results for  input(s): AMMONIA in the last 168 hours. Coagulation Profile: No results for input(s): INR, PROTIME in the last 168 hours. Cardiac Enzymes: No results for input(s): CKTOTAL, CKMB, CKMBINDEX, TROPONINI in the last 168 hours. BNP (last 3 results) No results for input(s): PROBNP in the last 8760 hours. HbA1C: No results for input(s): HGBA1C in the last 72 hours. CBG: Recent Labs  Lab 07/23/20 2142  GLUCAP 92   Lipid Profile: No results for input(s): CHOL, HDL, LDLCALC, TRIG, CHOLHDL, LDLDIRECT in the last 72 hours. Thyroid Function Tests: Recent Labs    07/23/20 0812  TSH 60.606*   Anemia Panel: No results for input(s): VITAMINB12, FOLATE, FERRITIN, TIBC, IRON, RETICCTPCT in the last 72 hours. Urine analysis:    Component Value Date/Time   COLORURINE STRAW (A) 07/22/2020 1910   APPEARANCEUR CLEAR 07/22/2020 1910   LABSPEC 1.008 07/22/2020 1910   PHURINE 5.0 07/22/2020 1910   GLUCOSEU NEGATIVE 07/22/2020 1910   GLUCOSEU NEGATIVE 06/22/2013 1619   HGBUR NEGATIVE 07/22/2020 1910   HGBUR negative 07/30/2009 1606   BILIRUBINUR NEGATIVE 07/22/2020 1910   BILIRUBINUR negative 12/05/2019 1535   KETONESUR NEGATIVE 07/22/2020 1910   PROTEINUR NEGATIVE 07/22/2020 1910   UROBILINOGEN 0.2 12/05/2019 1535   UROBILINOGEN 0.2 06/22/2013 1619   NITRITE NEGATIVE 07/22/2020 1910   LEUKOCYTESUR NEGATIVE 07/22/2020 1910   Sepsis Labs: @LABRCNTIP (procalcitonin:4,lacticidven:4)  ) Recent Results (from the past 240 hour(s))  Resp Panel by RT-PCR (Flu A&B, Covid) Nasopharyngeal Swab     Status: None   Collection Time: 07/22/20  7:24 PM   Specimen: Nasopharyngeal Swab; Nasopharyngeal(NP) swabs in vial transport medium  Result Value Ref Range Status   SARS Coronavirus 2 by RT PCR NEGATIVE NEGATIVE Final    Comment: (NOTE) SARS-CoV-2 target nucleic acids are NOT DETECTED.  The SARS-CoV-2 RNA is generally detectable in upper respiratory specimens during the acute phase of infection. The  lowest concentration of SARS-CoV-2 viral copies this assay can detect is 138 copies/mL. A negative result does not preclude SARS-Cov-2 infection and should not be used as the sole basis for treatment or other patient management decisions. A negative result may occur with  improper specimen collection/handling, submission of specimen other than nasopharyngeal swab, presence of viral mutation(s) within the areas targeted by this assay, and inadequate number of viral copies(<138 copies/mL). A negative result must be combined with clinical  observations, patient history, and epidemiological information. The expected result is Negative.  Fact Sheet for Patients:  EntrepreneurPulse.com.au  Fact Sheet for Healthcare Providers:  IncredibleEmployment.be  This test is no t yet approved or cleared by the [place] FDA and  has been authorized for detection and/or diagnosis of SARS-CoV-2 by FDA under an Emergency Use Authorization (EUA). This EUA will remain  in effect (meaning this test can be used) for the duration of the COVID-19 declaration under Section 564(b)(1) of the Act, 21 U.S.C.section 360bbb-3(b)(1), unless the authorization is terminated  or revoked sooner.       Influenza A by PCR NEGATIVE NEGATIVE Final   Influenza B by PCR NEGATIVE NEGATIVE Final    Comment: (NOTE) The Xpert Xpress SARS-CoV-2/FLU/RSV plus assay is intended as an aid in the diagnosis of influenza from Nasopharyngeal swab specimens and should not be used as a sole basis for treatment. Nasal washings and aspirates are unacceptable for Xpert Xpress SARS-CoV-2/FLU/RSV testing.  Fact Sheet for Patients: EntrepreneurPulse.com.au  Fact Sheet for Healthcare Providers: IncredibleEmployment.be  This test is not yet approved or cleared by the [place] FDA and has been authorized for detection and/or diagnosis of SARS-CoV-2 by FDA under  an Emergency Use Authorization (EUA). This EUA will remain in effect (meaning this test can be used) for the duration of the COVID-19 declaration under Section 564(b)(1) of the Act, 21 U.S.C. section 360bbb-3(b)(1), unless the authorization is terminated or revoked.  Performed at [facility], [place] [address], [place], [place] [address]   MRSA PCR Screening     Status: None   Collection Time: 07/22/20 10:56 PM   Specimen: Nasal Mucosa; Nasopharyngeal  Result Value Ref Range Status   MRSA by PCR NEGATIVE NEGATIVE Final    Comment:        The GeneXpert MRSA Assay (FDA approved for NASAL specimens only), is one component of a comprehensive MRSA colonization surveillance program. It is not intended to diagnose MRSA infection nor to guide or monitor treatment for MRSA infections. Performed at [facility], [place] [address], [place], [place] [address]       Radiology Studies: VAS [place] LOWER EXTREMITY VENOUS (DVT)  Result Date: 07/24/2020  Lower Venous DVT Study Indications: Edema, SOB.  Comparison Study: No prior studies. Performing Technologist: Darlin Coco RDMS,RVT  Examination Guidelines: A complete evaluation includes B-mode imaging, spectral Doppler, color Doppler, and power Doppler as needed of all accessible portions of each vessel. Bilateral testing is considered an integral part of a complete examination. Limited examinations for reoccurring indications may be performed as noted. The reflux portion of the exam is performed with the patient in reverse Trendelenburg.  +---------+---------------+---------+-----------+----------+--------------+ RIGHT    CompressibilityPhasicitySpontaneityPropertiesThrombus Aging +---------+---------------+---------+-----------+----------+--------------+ CFV      Partial        Yes      Yes                  Chronic        +---------+---------------+---------+-----------+----------+--------------+ SFJ      Full                                                         +---------+---------------+---------+-----------+----------+--------------+ FV Prox  Full                                                        +---------+---------------+---------+-----------+----------+--------------+  FV Mid   Full                                                        +---------+---------------+---------+-----------+----------+--------------+ FV DistalFull                                                        +---------+---------------+---------+-----------+----------+--------------+ PFV      Full                                                        +---------+---------------+---------+-----------+----------+--------------+ POP      Full           Yes      Yes                                 +---------+---------------+---------+-----------+----------+--------------+ PTV      Full                                                        +---------+---------------+---------+-----------+----------+--------------+ PERO     Full                                                        +---------+---------------+---------+-----------+----------+--------------+   +---------+---------------+---------+-----------+----------+--------------+ LEFT     CompressibilityPhasicitySpontaneityPropertiesThrombus Aging +---------+---------------+---------+-----------+----------+--------------+ CFV      Full           Yes      Yes                                 +---------+---------------+---------+-----------+----------+--------------+ SFJ      Full                                                        +---------+---------------+---------+-----------+----------+--------------+ FV Prox  Full                                                        +---------+---------------+---------+-----------+----------+--------------+ FV Mid   Full                                                         +---------+---------------+---------+-----------+----------+--------------+  FV DistalFull                                                        +---------+---------------+---------+-----------+----------+--------------+ PFV      Full                                                        +---------+---------------+---------+-----------+----------+--------------+ POP      Full           Yes      Yes                                 +---------+---------------+---------+-----------+----------+--------------+ PTV      Full                                                        +---------+---------------+---------+-----------+----------+--------------+ PERO     Full                                                        +---------+---------------+---------+-----------+----------+--------------+     Summary: RIGHT: - Findings consistent with chronic, non-occlusive 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 - No cystic structure found in the popliteal fossa.  LEFT: - There is no evidence of deep vein thrombosis in the lower extremity.  - No cystic structure found in the popliteal fossa.  *See table(s) above for measurements and observations. Electronically signed by Ruta Hinds MD on 07/24/2020 at 3:09:25 PM.    Final      Scheduled Meds: . apixaban  2.5 mg Oral BID  . atorvastatin  40 mg Oral QHS  . Chlorhexidine Gluconate Cloth  6 each Topical Daily  . furosemide  40 mg Intravenous Q12H  . lactose free nutrition  237 mL Oral TID WC  . levothyroxine  75 mcg Oral Q0600  . multivitamin with minerals  1 tablet Oral Daily  . polyethylene glycol  17 g Oral Daily  . traZODone  25 mg Oral QHS   Continuous Infusions: . ampicillin IVPB 2 gram/NS 100 mL (Mini-Bag Plus) 2 g (07/25/20 0852)  . cefTRIAXone (ROCEPHIN) IVPB 2 gram/100 mL NS (Mini-Bag Plus) 2 g (07/25/20 0954)     LOS: 3 days   Time Spent in minutes   45 minutes  [name] D.O.  on 07/25/2020 at 12:36 PM  Between 7am to 7pm - Please see pager noted on [email or web address]  After 7pm go to [email or web address]  And look for the night coverage person covering for me after hours  [facility] Office  [phone]

## 2020-07-25 NOTE — Evaluation (Addendum)
Physical Therapy Evaluation Patient Details Name: [name] MRN: [id number] DOB: 09-17-1932 Today's Date: 07/25/2020   History of Present Illness  87 y.o. female presented to ED 3/9 after fall and question of near syncope. CT lumbar spine reveals L1 acute compression fx. Admitted for treatment of possible syncope, L1 fx, Worsening of chronic transaminases and UTI. Pt reports numbness and tingling in bilateral LE R>L, bladder and bowel incontinence. TLSO for OOB Found to have mitral valv vegetation  PMH: chronic back pain secondary to lumbar spine OA, chronic ambulation dysfunction, CAD status post CABG, chronic A. fib on Eliquis, HLD, aortic regurgitation and mitral regurgitation,  Clinical Impression  Pt admitted with/for CHF.  Pt still significantly weak needing min to light moderate assist for basic mobility and limited gait.  Pt currently limited functionally due to the problems listed. ( See problems list.)   Pt will benefit from PT to maximize function and safety in order to get ready for next venue listed below.     Follow Up Recommendations Supervision/Assistance - 24 hour;SNF    Equipment Recommendations  None recommended by PT    Recommendations for Other Services       Precautions / Restrictions Precautions Precautions: Fall;Back Precaution Booklet Issued: Yes (comment) Precaution Comments: spinal brace for OOB donned in seated, educated RN to proper don and dof, RN able to complete independently Required Braces or Orthoses: Spinal Brace Spinal Brace: Thoracolumbosacral orthotic      Mobility  Bed Mobility Overal bed mobility: Needs Assistance Bed Mobility: Rolling;Sidelying to Sit Rolling: Min assist Sidelying to sit: Min assist       General bed mobility comments: cues for technique truncal assist to roll and come up from R UE.  pt scooted with B UE and no assist.    Transfers Overall transfer level: Needs assistance Equipment used: Rolling walker (2  wheeled) Transfers: Sit to/from Stand Sit to Stand: Min assist         General transfer comment: cues for hand placement,  assist to come forward more than boost.  Ambulation/Gait Ambulation/Gait assistance: Mod assist;Min assist Gait Distance (Feet): 3 Feet (forward and back x2) Assistive device: Rolling walker (2 wheeled) Gait Pattern/deviations: Step-through pattern   Gait velocity interpretation: <1.31 ft/sec, indicative of household ambulator General Gait Details: overall posterior bias needing light mod assist to propel forward and minimal assist to keep forward when backing up.  Stairs            Wheelchair Mobility    Modified Rankin (Stroke Patients Only)       Balance Overall balance assessment: Needs assistance Sitting-balance support: No upper extremity supported;Bilateral upper extremity supported Sitting balance-Leahy Scale: Fair     Standing balance support: Bilateral upper extremity supported Standing balance-Leahy Scale: Poor Standing balance comment: posterior bias.                             Pertinent Vitals/Pain Pain Assessment: Faces Faces Pain Scale: Hurts little more Pain Location: back Pain Descriptors / Indicators: Guarding Pain Intervention(s): Monitored during session    Home Living Family/patient expects to be discharged to:: Private residence Living Arrangements: Spouse/significant other Available Help at Discharge: Family;Available PRN/intermittently Type of Home: House Home Access: Stairs to enter Entrance Stairs-Rails: None Entrance Stairs-Number of Steps: 2 Home Layout: Two level;Able to live on main level with bedroom/bathroom Home Equipment: Shower seat;Walker - 2 wheels;Walker - standard      Prior Function Level  of Independence: Needs assistance   Gait / Transfers Assistance Needed: uses RW for household ambulation recently  ADL's / Homemaking Assistance Needed: does birdbaths when husband is not  around, in shower when he is, unable clean, tries to assist with laundry        Hand Dominance   Dominant Hand: Right    Extremity/Trunk Assessment   Upper Extremity Assessment Upper Extremity Assessment: Generalized weakness    Lower Extremity Assessment Lower Extremity Assessment: Overall WFL for tasks assessed (general weakness bil 4/5  L LE stronger overall than R LE)    Cervical / Trunk Assessment Cervical / Trunk Assessment: Kyphotic  Communication   Communication: No difficulties  Cognition Arousal/Alertness: Awake/alert Behavior During Therapy: WFL for tasks assessed/performed;Anxious Overall Cognitive Status: Within Functional Limits for tasks assessed                                        General Comments General comments (skin integrity, edema, etc.): SpO2 on RA slowly dropped to 88% and on 1L [place] pt able to maintain 93%, HR 80's and 90's bpm.    Exercises     Assessment/Plan    PT Assessment Patient needs continued PT services  PT Problem List Decreased strength;Decreased activity tolerance;Decreased balance;Decreased mobility;Decreased knowledge of use of DME;Cardiopulmonary status limiting activity;Pain       PT Treatment Interventions DME instruction;Gait training;Stair training;Functional mobility training;Therapeutic activities;Patient/family education    PT Goals (Current goals can be found in the Care Plan section)  Acute Rehab PT Goals Patient Stated Goal: be able to do for myself, get stronger PT Goal Formulation: With patient/family Time For Goal Achievement: 07/20/20 Potential to Achieve Goals: Good    Frequency Min 3X/week   Barriers to discharge        Co-evaluation               AM-PAC PT "6 Clicks" Mobility  Outcome Measure Help needed turning from your back to your side while in a flat bed without using bedrails?: A Little Help needed moving from lying on your back to sitting on the side of a flat bed  without using bedrails?: A Little Help needed moving to and from a bed to a chair (including a wheelchair)?: A Little Help needed standing up from a chair using your arms (e.g., wheelchair or bedside chair)?: A Little Help needed to walk in hospital room?: A Little Help needed climbing 3-5 steps with a railing? : A Little 6 Click Score: 18    End of Session Equipment Utilized During Treatment: Back brace Activity Tolerance: Patient tolerated treatment well;Patient limited by fatigue Patient left: in chair;with call bell/phone within reach;with family/visitor present Nurse Communication: Mobility status PT Visit Diagnosis: Unsteadiness on feet (R26.81);Other abnormalities of gait and mobility (R26.89);Pain;Difficulty in walking, not elsewhere classified (R26.2) Pain - part of body:  (back)    Time: 1325-1402 PT Time Calculation (min) (ACUTE ONLY): 37 min   Charges:   PT Evaluation $PT Eval Moderate Complexity: 1 Mod PT Treatments $Gait Training: 8-22 mins        07/25/2020  Ginger Carne., PT Acute Rehabilitation Services [phone]  (pager) [phone]  (office)  Tessie Fass Irelynn Schermerhorn 07/25/2020, 2:27 PM

## 2020-07-26 DIAGNOSIS — I5021 Acute systolic (congestive) heart failure: Secondary | ICD-10-CM

## 2020-07-26 DIAGNOSIS — K52832 Lymphocytic colitis: Secondary | ICD-10-CM

## 2020-07-26 LAB — BASIC METABOLIC PANEL
Anion gap: 3 — ABNORMAL LOW (ref 5–15)
BUN: 10 mg/dL (ref 8–23)
CO2: 35 mmol/L — ABNORMAL HIGH (ref 22–32)
Calcium: 7.5 mg/dL — ABNORMAL LOW (ref 8.9–10.3)
Chloride: 98 mmol/L (ref 98–111)
Creatinine, Ser: 0.9 mg/dL (ref 0.44–1.00)
GFR, Estimated: >60 mL/min (ref >60)
Glucose, Bld: 86 mg/dL (ref 70–99)
Potassium: 3.3 mmol/L — ABNORMAL LOW (ref 3.5–5.1)
Sodium: 136 mmol/L (ref 135–145)

## 2020-07-26 LAB — CBC
HCT: 25.1 % — ABNORMAL LOW (ref 36.0–46.0)
Hemoglobin: 8 g/dL — ABNORMAL LOW (ref 12.0–15.0)
MCH: 27.1 pg (ref 26.0–34.0)
MCHC: 31.9 g/dL (ref 30.0–36.0)
MCV: 85.1 fL (ref 80.0–100.0)
Platelets: 184 10*3/uL (ref 150–400)
RBC: 2.95 MIL/uL — ABNORMAL LOW (ref 3.87–5.11)
RDW: 18 % — ABNORMAL HIGH (ref 11.5–15.5)
WBC: 6.7 10*3/uL (ref 4.0–10.5)
nRBC: 0 % (ref 0.0–0.2)

## 2020-07-26 LAB — MAGNESIUM: Magnesium: 1.9 mg/dL (ref 1.7–2.4)

## 2020-07-26 MED ORDER — POTASSIUM CHLORIDE CRYS ER 20 MEQ PO TBCR
40.0000 meq | EXTENDED_RELEASE_TABLET | ORAL | Status: AC
  Administered 2020-07-26 (×2): 40 meq via ORAL
  Filled 2020-07-26 (×2): qty 2

## 2020-07-26 MED ORDER — FUROSEMIDE 10 MG/ML IJ SOLN: 40.0000 mg | Freq: Every day | INTRAMUSCULAR | Status: DC

## 2020-07-26 NOTE — Evaluation (Signed)
Occupational Therapy Evaluation Patient Details Name: [name] MRN: [id number] DOB: May 04, 1932 Today's Date: 07/26/2020    History of Present Illness 87 y.o. female presented to ED 3/9 after fall and question of near syncope. CT lumbar spine reveals L1 acute compression fx. Admitted for treatment of possible syncope, L1 fx, Worsening of chronic transaminases and UTI. Pt reports numbness and tingling in bilateral LE R>L, bladder and bowel incontinence. TLSO for OOB. Found to have mitral valv vegetation  PMH: chronic back pain secondary to lumbar spine OA, chronic ambulation dysfunction, CAD status post CABG, chronic A. fib on Eliquis, HLD, aortic regurgitation and mitral regurgitation,   Clinical Impression   PTA, pt was at [facility] for rehab and was performing BADLS and using RW with staff. Pt currently requiring Min A for UB ADLs, Mod A for LB ADLs, and Min Guard-Min A for functional mobility with RW. Pt presenting with decreased strength, balance, and activity tolerance. Pt very motivated to participate in therapy despite fatigue and pain. Pt would benefit from further acute OT to facilitate safe dc. Recommend dc to SNF for further OT to optimize safety, independence with ADLs, and return to PLOF.   SpO2 90s on RA (with good pleth). HR and BP stable.    Follow Up Recommendations  SNF    Equipment Recommendations  Other (comment) (Defer to next venue)    Recommendations for Other Services PT consult     Precautions / Restrictions Precautions Precautions: Fall;Back Precaution Booklet Issued: Yes (comment) Precaution Comments: spinal brace for OOB donned in seated, educated RN to proper don and dof, RN able to complete independently Required Braces or Orthoses: Spinal Brace Spinal Brace: Thoracolumbosacral orthotic Restrictions Weight Bearing Restrictions: No      Mobility Bed Mobility Overal bed mobility: Needs Assistance Bed Mobility: Rolling;Sidelying to Sit Rolling: Min  guard Sidelying to sit: Min guard       General bed mobility comments: Min GUard A for safety. Pt performing log roll technique with Min cues    Transfers Overall transfer level: Needs assistance Equipment used: Rolling walker (2 wheeled) Transfers: Sit to/from Stand Sit to Stand: Min assist         General transfer comment: MIn A for weight shift forward and correcting slight posterior lean    Balance Overall balance assessment: Needs assistance Sitting-balance support: No upper extremity supported;Bilateral upper extremity supported Sitting balance-Leahy Scale: Fair     Standing balance support: Bilateral upper extremity supported;Single extremity supported Standing balance-Leahy Scale: Poor Standing balance comment: posterior bias.                           ADL either performed or assessed with clinical judgement   ADL Overall ADL's : Needs assistance/impaired Eating/Feeding: Set up;Sitting   Grooming: Set up;Supervision/safety;Sitting   Upper Body Bathing: Minimal assistance;Sitting   Lower Body Bathing: Moderate assistance;Sit to/from stand   Upper Body Dressing : Minimal assistance;Sitting Upper Body Dressing Details (indicate cue type and reason): Pt attempting to don brace but requiring Min A for repositioning and guiding cues for sequencing     Toilet Transfer: Minimal assistance;Min guard;Ambulation;RW (simulated to recliner) Toilet Transfer Details (indicate cue type and reason): MIn A for weight shift forward and correcting slight posterior lean Toileting- Clothing Manipulation and Hygiene: Moderate assistance Toileting - Clothing Manipulation Details (indicate cue type and reason): Pt participating and performing first wipes; requiring assistance to make sure she was clean. Pt also requiring assistance for  donning/doffing and managing depends     Functional mobility during ADLs: Min guard;Rolling walker General ADL Comments: Pt participating  in mobility in room and toileting after bowel incontinence.     Vision Baseline Vision/History: Wears glasses Patient Visual Report: No change from baseline       Perception     Praxis      Pertinent Vitals/Pain Pain Assessment: Faces Faces Pain Scale: Hurts little more Pain Location: back; RLE Pain Descriptors / Indicators: Guarding Pain Intervention(s): Monitored during session     Hand Dominance Right   Extremity/Trunk Assessment Upper Extremity Assessment Upper Extremity Assessment: Generalized weakness   Lower Extremity Assessment Lower Extremity Assessment: Defer to PT evaluation;RLE deficits/detail RLE Deficits / Details: Reports pain at RLE   Cervical / Trunk Assessment Cervical / Trunk Assessment: Kyphotic   Communication Communication Communication: No difficulties   Cognition Arousal/Alertness: Awake/alert Behavior During Therapy: WFL for tasks assessed/performed;Anxious Overall Cognitive Status: Within Functional Limits for tasks assessed                                     General Comments  SpO2 90s on RA (with good pleth). HR and BP stable    Exercises     Shoulder Instructions      Home Living Family/patient expects to be discharged to:: Skilled nursing facility Living Arrangements: Spouse/significant other Available Help at Discharge: Family;Available PRN/intermittently Type of Home: House Home Access: Stairs to enter [employer] of Steps: 2 Entrance Stairs-Rails: None Home Layout: Two level;Able to live on main level with bedroom/bathroom     Bathroom Shower/Tub: Walk-in shower;Tub/shower unit   Bathroom Toilet: Handicapped height Bathroom Accessibility: Yes   Home Equipment: Clinical cytogeneticist - 2 wheels;Walker - standard   Additional Comments: Pt was at [facility] for rehab prior to this admission.  Lives With: Spouse    Prior Functioning/Environment Level of Independence: Needs assistance  Gait /  Transfers Assistance Needed: uses RW for household ambulation recently ADL's / Homemaking Assistance Needed: does birdbaths when husband is not around, in shower when he is. Was performing light IADLs including assisting with laundry            OT Problem List: Decreased strength;Decreased activity tolerance;Impaired balance (sitting and/or standing);Decreased knowledge of use of DME or AE;Decreased knowledge of precautions;Pain      OT Treatment/Interventions: Self-care/ADL training;Therapeutic exercise;DME and/or AE instruction;Therapeutic activities;Patient/family education;Balance training    OT Goals(Current goals can be found in the care plan section) Acute Rehab OT Goals Patient Stated Goal: be able to do for myself, get stronger OT Goal Formulation: With patient Time For Goal Achievement: 08/09/20 Potential to Achieve Goals: Good  OT Frequency: Min 2X/week   Barriers to D/C:            Co-evaluation              AM-PAC OT "6 Clicks" Daily Activity     Outcome Measure Help from another person eating meals?: None Help from another person taking care of personal grooming?: A Little Help from another person toileting, which includes using toliet, bedpan, or urinal?: A Lot Help from another person bathing (including washing, rinsing, drying)?: A Little Help from another person to put on and taking off regular upper body clothing?: A Lot Help from another person to put on and taking off regular lower body clothing?: A Lot 6 Click Score: 16   End of Session Equipment  Utilized During Treatment: Rolling walker;Back brace Nurse Communication: Mobility status  Activity Tolerance: Patient tolerated treatment well Patient left: in chair;with call bell/phone within reach;with nursing/sitter in room  OT Visit Diagnosis: Unsteadiness on feet (R26.81);Other abnormalities of gait and mobility (R26.89);Muscle weakness (generalized) (M62.81);History of falling (Z91.81);Pain Pain  - Right/Left: Right Pain - part of body: Leg (Back)                Time: 1658-0063 OT Time Calculation (min): 45 min Charges:  OT General Charges $OT Visit: 1 Visit OT Evaluation $OT Eval Moderate Complexity: 1 Mod OT Treatments $Self Care/Home Management : 23-37 mins  [place], OTR/L Acute Rehab Pager: [phone] Office: [place] 07/26/2020, 8:28 AM

## 2020-07-26 NOTE — Progress Notes (Signed)
PROGRESS NOTE    Sally TRINE  Valdez:[id number] DOB: 1933/04/13 DOA: 07/22/2020 PCP: [name], MD   Brief Narrative:  HPI On 07/22/2020 by Dr. Gean Birchwood [name] is a 87 y.o. female with known history of atrial fibrillation, hypothyroidism, atrophic gastritis who was recently admitted and discharged on July 13, 2020 after being diagnosed with enterococcal bacteremia and aortic valve endocarditis with possible T12/L1 discitis has been experiencing shortness of breath and increasing peripheral edema over the last 3 days.  Patient was placed on compression stocking and Lasix despite which patient was getting more short of breath was referred to the ER.  Denies any chest pain productive cough fever or chills.  Interim history Patient was recently admitted from [id number] with Enterococcus bacteremia, aortic valve endocarditis with possible T12-L1 discitis.  She returns with shortness of breath, leg swelling is admitted with acute on chronic systolic CHF exacerbation and hypothyroidism with possible myxedema.  Patient started on IV Lasix as well as her increased dose of Synthroid. Assessment & Plan   Acute on chronic combined systolic and diastolic CHF exacerbation with acute respiratory failure -Patient presented with shortness of breath as well as lower extremity swelling -BNP 640.7 -Chest x-ray showed cardiomegaly with small to moderate bilateral pleural effusions and bibasilar airspace opacities, likely pulmonary edema -Echocardiogram 07/06/2020 showed an EF of 40 to 45%, LV with regional wall motion abnormalities.  Abnormal septal motion, consistent with left bundle branch block.  Mild LVH.  Diastolic parameters indeterminate.  Moderately elevated pulmonary artery pressure. -Repeat echocardiogram on 3/28 shows an EF of 81%, grade 2 diastolic dysfunction -Patient was placed on 5 L of nasal cannula on admission, however has been weaned to 1L today.  Does not wear oxygen at  home.   -Patient's oxygen saturation dropped to 88% on room air while working with physical therapy and she was placed on 1L to maintain O2 saturation of 93%. -was placed on IV Lasix 40 mg twice daily- will decrease to daily  -Monitor intake and output, daily weights  Elevated troponin -Suspect secondary to the above -Likely demand -Currently no chest pain or changes on EKG. -Echocardiogram as above  Hypothyroidism/myxedema -TSH of 65, repeat 60 -Synthroid dose increased to 75 mcg at 6 AM.  Seems that patient was receiving the medication in the evening -Question whether she was receiving this medication appropriately -Repeat TSH in 4 weeks  Chronic kidney disease, stage IIIa -Upon chart review, GFR in stage IIIa since 2019 -Continue to monitor creatinine closely  Enterococcus bacteremia/aortic valve endocarditis with possible T12-L1 discitis -Echocardiogram showed Small (0.5 x 0.9 cm) mobile vegetation on ventricular size of aortic valve. -Patient was started on ceftriaxone ampicillin with plans for 6 weeks starting on 07/09/2020  Anemia of chronic disease -Hemoglobin appears stable, continue to monitor CBC  History of CAD/CABG -Currently denies any chest pain  -Continue statin, currently on Eliquis  Paroxysmal atrial fibrillation -Currently not in rhythm control due to bradycardia -Continue Eliquis  History of atrophic gastritis -Continue PPI and Carafate  Degenerative disc disease/chronic back pain -Continue Lidoderm patch, Tylenol and oxycodone as needed  Lactose intolerance -Continue Lactaid, lactose-free nutrition  Physical deconditioning -Patient is from [place] -PT and OT recommending SNF -TOC made aware  Pressure skin injury/lower extremity wounds/blistering -Present on admission -Patient with bilateral mid coccyx stage I pressure injury -Continue wound care  Severe malnutrition -BMI of 18.39 -In the context of chronic illness -Nutrition  consulted -Continue nutritional supplements and multivitamin  Hypokalemia -Likely secondary to diuresis -magnesium 1.9 -Will continue to supplement potassium and monitor BMP  DVT Prophylaxis Eliquis  Code Status: DNR  Family Communication: None at bedside  Disposition Plan:  Status is: Inpatient  Remains inpatient appropriate because:IV treatments appropriate due to intensity of illness or inability to take PO and Inpatient level of care appropriate due to severity of illness   Dispo: The patient is from: SNF              Anticipated d/c is to: SNF              Patient currently is not medically stable to d/c.   Difficult to place patient No  Consultants None  Procedures  Echocardiogram  Antibiotics   Anti-infectives (From admission, onward)   Start     Dose/Rate Route Frequency Ordered Stop   07/24/20 0900  ampicillin (OMNIPEN) 2 g in sodium chloride 0.9 % 100 mL IVPB       Note to Pharmacy: 6 weeks from 07/09/2020.     2 g 300 mL/hr over 20 Minutes Intravenous Every 6 hours 07/24/20 0713     07/22/20 2315  ampicillin (OMNIPEN) 2 g in sodium chloride 0.9 % 100 mL IVPB  Status:  Discontinued       Note to Pharmacy: 6 weeks from 07/09/2020.     2 g 300 mL/hr over 20 Minutes Intravenous Every 8 hours 07/22/20 2209 07/24/20 0713   07/22/20 2215  cefTRIAXone (ROCEPHIN) 2 g in sodium chloride 0.9 % 100 mL IVPB       Note to Pharmacy: 6 weeks from 07/09/2020.     2 g 200 mL/hr over 30 Minutes Intravenous Every 12 hours 07/22/20 2209        Subjective:   [name] seen and examined today.  Patient states she is feeling better today as compared to previous days.  Continues to have some shortness of breath with minimal activity.  Denies current chest pain, abdominal pain, nausea or vomiting, diarrhea or constipation, dizziness or headache.    Objective:   Vitals:   07/25/20 2343 07/26/20 0300 07/26/20 0800 07/26/20 0811  BP: (!) 128/46 (!) 103/58  (!) 114/44  Pulse:  (!) 59 (!) 57  65  Resp: 16 18  15   Temp: 98.3 F (36.8 C) 98.7 F (37.1 C)  97.8 F (36.6 C)  TempSrc: Oral Oral  Oral  SpO2: 96% 99% 99% 94%  Weight:  46.6 kg    Height:        Intake/Output Summary (Last 24 hours) at 07/26/2020 1019 Last data filed at 07/26/2020 0800 Gross per 24 hour  Intake 240 ml  Output 1200 ml  Net -960 ml   Filed Weights   07/24/20 0122 07/25/20 0500 07/26/20 0300  Weight: 54 kg 47.1 kg 46.6 kg   Exam  General: Well developed, Chronically ill-appearing, NAD  HEENT: NCAT, mucous membranes moist.   Cardiovascular: S1 S2 auscultated, RRR  Respiratory: Diminished breath sounds however clear  Abdomen: Soft, nontender, nondistended, + bowel sounds  Extremities: warm dry without cyanosis clubbing.  Lower extremity edema improving  Neuro: AAOx3, nonfocal  Skin: Lower extremities with several dressings in place, blisters noted on dorsal aspect of feet bilaterally  Psych: pleasant, appropriate mood and affect   Data Reviewed: I have personally reviewed following labs and imaging studies  CBC: Recent Labs  Lab 07/22/20 1927 07/23/20 0812 07/24/20 0455 07/25/20 0545 07/26/20 0500  WBC 10.8* 8.8 8.1 7.3 6.7  NEUTROABS  9.4*  --   --   --   --   HGB 10.0* 9.1* 8.8* 8.8* 8.0*  HCT 31.7* 27.7* 26.8* 27.5* 25.1*  MCV 87.3 85.0 84.8 85.4 85.1  PLT 219 201 211 195 465   Basic Metabolic Panel: Recent Labs  Lab 07/22/20 1927 07/23/20 0812 07/24/20 0455 07/25/20 0545 07/25/20 1302 07/26/20 0500 07/26/20 0757  NA 136 138 139 139  --  136  --   K 3.6 3.7 3.8 3.3*  --  3.3*  --   CL 106 103 103 100  --  98  --   CO2 24 30 32 33*  --  35*  --   GLUCOSE 127* 84 73 75  --  86  --   BUN 11 11 10 9   --  10  --   CREATININE 1.22* 0.82 0.85 0.84  --  0.90  --   CALCIUM 7.6* 7.5* 7.1* 7.6*  --  7.5*  --   MG  --  1.8 1.7  --  1.7  --  1.9  PHOS  --  2.7 2.5  --   --   --   --    GFR: Estimated Creatinine Clearance: 33.6 mL/min (by C-G formula  based on SCr of 0.9 mg/dL). Liver Function Tests: Recent Labs  Lab 07/22/20 1927 07/23/20 0812 07/24/20 0455  AST 36  --   --   ALT 32  --   --   ALKPHOS 78  --   --   BILITOT 0.5  --   --   PROT 5.5*  --   --   ALBUMIN 2.0* 1.9* 1.8*   No results for input(s): LIPASE, AMYLASE in the last 168 hours. No results for input(s): AMMONIA in the last 168 hours. Coagulation Profile: No results for input(s): INR, PROTIME in the last 168 hours. Cardiac Enzymes: No results for input(s): CKTOTAL, CKMB, CKMBINDEX, TROPONINI in the last 168 hours. BNP (last 3 results) No results for input(s): PROBNP in the last 8760 hours. HbA1C: No results for input(s): HGBA1C in the last 72 hours. CBG: Recent Labs  Lab 07/23/20 2142  GLUCAP 92   Lipid Profile: No results for input(s): CHOL, HDL, LDLCALC, TRIG, CHOLHDL, LDLDIRECT in the last 72 hours. Thyroid Function Tests: No results for input(s): TSH, T4TOTAL, FREET4, T3FREE, THYROIDAB in the last 72 hours. Anemia Panel: No results for input(s): VITAMINB12, FOLATE, FERRITIN, TIBC, IRON, RETICCTPCT in the last 72 hours. Urine analysis:    Component Value Date/Time   COLORURINE STRAW (A) 07/22/2020 1910   APPEARANCEUR CLEAR 07/22/2020 1910   LABSPEC 1.008 07/22/2020 1910   PHURINE 5.0 07/22/2020 1910   GLUCOSEU NEGATIVE 07/22/2020 1910   GLUCOSEU NEGATIVE 06/22/2013 1619   HGBUR NEGATIVE 07/22/2020 1910   HGBUR negative 07/30/2009 1606   BILIRUBINUR NEGATIVE 07/22/2020 1910   BILIRUBINUR negative 12/05/2019 1535   KETONESUR NEGATIVE 07/22/2020 1910   PROTEINUR NEGATIVE 07/22/2020 1910   UROBILINOGEN 0.2 12/05/2019 1535   UROBILINOGEN 0.2 06/22/2013 1619   NITRITE NEGATIVE 07/22/2020 1910   LEUKOCYTESUR NEGATIVE 07/22/2020 1910   Sepsis Labs: @LABRCNTIP (procalcitonin:4,lacticidven:4)  ) Recent Results (from the past 240 hour(s))  Resp Panel by RT-PCR (Flu A&B, Covid) Nasopharyngeal Swab     Status: None   Collection Time: 07/22/20   7:24 PM   Specimen: Nasopharyngeal Swab; Nasopharyngeal(NP) swabs in vial transport medium  Result Value Ref Range Status   SARS Coronavirus 2 by RT PCR NEGATIVE NEGATIVE Final    Comment: (NOTE) SARS-CoV-2  target nucleic acids are NOT DETECTED.  The SARS-CoV-2 RNA is generally detectable in upper respiratory specimens during the acute phase of infection. The lowest concentration of SARS-CoV-2 viral copies this assay can detect is 138 copies/mL. A negative result does not preclude SARS-Cov-2 infection and should not be used as the sole basis for treatment or other patient management decisions. A negative result may occur with  improper specimen collection/handling, submission of specimen other than nasopharyngeal swab, presence of viral mutation(s) within the areas targeted by this assay, and inadequate number of viral copies(<138 copies/mL). A negative result must be combined with clinical observations, patient history, and epidemiological information. The expected result is Negative.  Fact Sheet for Patients:  EntrepreneurPulse.com.au  Fact Sheet for Healthcare Providers:  IncredibleEmployment.be  This test is no t yet approved or cleared by the [place] FDA and  has been authorized for detection and/or diagnosis of SARS-CoV-2 by FDA under an Emergency Use Authorization (EUA). This EUA will remain  in effect (meaning this test can be used) for the duration of the COVID-19 declaration under Section 564(b)(1) of the Act, 21 U.S.C.section 360bbb-3(b)(1), unless the authorization is terminated  or revoked sooner.       Influenza A by PCR NEGATIVE NEGATIVE Final   Influenza B by PCR NEGATIVE NEGATIVE Final    Comment: (NOTE) The Xpert Xpress SARS-CoV-2/FLU/RSV plus assay is intended as an aid in the diagnosis of influenza from Nasopharyngeal swab specimens and should not be used as a sole basis for treatment. Nasal washings and aspirates  are unacceptable for Xpert Xpress SARS-CoV-2/FLU/RSV testing.  Fact Sheet for Patients: EntrepreneurPulse.com.au  Fact Sheet for Healthcare Providers: IncredibleEmployment.be  This test is not yet approved or cleared by the [place] FDA and has been authorized for detection and/or diagnosis of SARS-CoV-2 by FDA under an Emergency Use Authorization (EUA). This EUA will remain in effect (meaning this test can be used) for the duration of the COVID-19 declaration under Section 564(b)(1) of the Act, 21 U.S.C. section 360bbb-3(b)(1), unless the authorization is terminated or revoked.  Performed at [facility], [place] [address], [place], [place] [address]   MRSA PCR Screening     Status: None   Collection Time: 07/22/20 10:56 PM   Specimen: Nasal Mucosa; Nasopharyngeal  Result Value Ref Range Status   MRSA by PCR NEGATIVE NEGATIVE Final    Comment:        The GeneXpert MRSA Assay (FDA approved for NASAL specimens only), is one component of a comprehensive MRSA colonization surveillance program. It is not intended to diagnose MRSA infection nor to guide or monitor treatment for MRSA infections. Performed at [facility], [place] [address] [place], [place] [address]       Radiology Studies: No results found.   Scheduled Meds: . apixaban  2.5 mg Oral BID  . atorvastatin  40 mg Oral QHS  . Chlorhexidine Gluconate Cloth  6 each Topical Daily  . furosemide  40 mg Intravenous Q12H  . lactose free nutrition  237 mL Oral TID WC  . levothyroxine  75 mcg Oral Q0600  . multivitamin with minerals  1 tablet Oral Daily  . polyethylene glycol  17 g Oral Daily  . potassium chloride  40 mEq Oral Q4H  . traZODone  25 mg Oral QHS   Continuous Infusions: . ampicillin IVPB 2 gram/NS 100 mL (Mini-Bag Plus) 2 g (07/26/20 0903)  . cefTRIAXone (ROCEPHIN) IVPB 2 gram/100 mL NS (Mini-Bag Plus) 2 g (07/26/20  1017)     LOS: 4 days   Time  Spent in minutes   30 minutes  Paisli Silfies D.O. on 07/26/2020 at 10:19 AM  Between 7am to 7pm - Please see pager noted on [email or web address]  After 7pm go to [email or web address]  And look for the night coverage person covering for me after hours  [facility] Office  [phone]

## 2020-07-26 NOTE — TOC Progression Note (Addendum)
Transition of Care [facility]) - Progression Note    Patient Details  Name: [name] MRN: [id number] Date of Birth: Sep 24, 1932  Transition of Care [facility]) CM/SW Contact  Reece Agar, [place] Phone Number: 07/26/2020, 10:20 AM  Clinical Narrative:    9:00am- CSW started [employer] on pt and faxed over clinicals for pt going to [facility] will follow up with ref# [id number].  12:30pm- Insurance Auth complete for [facility] approval for days 07/27/2020-07/31/2020 and ref# [id number]  2:15pm- CSW followed up with pt and spouse (in the room), pt is ready to return to [facility] and has no other concerns or needs from CSW other than PTAR.  CSW requested COVID MD said she will wait to put it in on 4/1 so its not prematurely done. Facility can be called for further DC needs/instruction.    Expected Discharge Plan: [place] Barriers to Discharge: Continued Medical Work up  Expected Discharge Plan and Services Expected Discharge Plan: [place] In-house Referral: Clinical Social Work   Post Acute Care Choice: [place] Living arrangements for the past 2 months: [place]                                       Social Determinants of Health (SDOH) Interventions    Readmission Risk Interventions No flowsheet data found.

## 2020-07-27 LAB — BASIC METABOLIC PANEL
Anion gap: 4 — ABNORMAL LOW (ref 5–15)
BUN: 9 mg/dL (ref 8–23)
CO2: 34 mmol/L — ABNORMAL HIGH (ref 22–32)
Calcium: 7.9 mg/dL — ABNORMAL LOW (ref 8.9–10.3)
Chloride: 100 mmol/L (ref 98–111)
Creatinine, Ser: 0.89 mg/dL (ref 0.44–1.00)
GFR, Estimated: >60 mL/min (ref >60)
Glucose, Bld: 79 mg/dL (ref 70–99)
Potassium: 4.3 mmol/L (ref 3.5–5.1)
Sodium: 138 mmol/L (ref 135–145)

## 2020-07-27 LAB — HEMOGLOBIN AND HEMATOCRIT, BLOOD
HCT: 25.4 % — ABNORMAL LOW (ref 36.0–46.0)
Hemoglobin: 8.3 g/dL — ABNORMAL LOW (ref 12.0–15.0)

## 2020-07-27 LAB — SARS CORONAVIRUS 2 (TAT 6-24 HRS): SARS Coronavirus 2: NEGATIVE

## 2020-07-27 MED ORDER — AMPICILLIN IV (FOR PTA / DISCHARGE USE ONLY): 2.0000 g | Freq: Four times a day (QID) | INTRAVENOUS | 0 refills | Status: DC

## 2020-07-27 MED ORDER — LEVOTHYROXINE SODIUM 75 MCG PO TABS: 75.0000 ug | ORAL_TABLET | Freq: Every day | ORAL | Status: AC

## 2020-07-27 MED ORDER — FUROSEMIDE 20 MG PO TABS: 60.0000 mg | ORAL_TABLET | Freq: Every day | ORAL | Status: DC

## 2020-07-27 MED ORDER — OXYCODONE HCL 5 MG PO TABS
5.0000 mg | ORAL_TABLET | Freq: Four times a day (QID) | ORAL | 0 refills | Status: DC | PRN
Start: 2020-07-27 — End: 2020-09-10

## 2020-07-27 MED ORDER — CEFTRIAXONE IV (FOR PTA / DISCHARGE USE ONLY): 2.0000 g | Freq: Two times a day (BID) | INTRAVENOUS | 0 refills | Status: DC

## 2020-07-27 MED ORDER — SODIUM CHLORIDE 0.9 % IV SOLN
2.0000 g | Freq: Three times a day (TID) | INTRAVENOUS | Status: DC
  Administered 2020-07-27: 2 g via INTRAVENOUS
  Filled 2020-07-27 (×3): qty 2000

## 2020-07-27 MED ORDER — HEPARIN SOD (PORK) LOCK FLUSH 100 UNIT/ML IV SOLN
250.0000 [IU] | INTRAVENOUS | Status: AC | PRN
  Administered 2020-07-27: 250 [IU]
  Filled 2020-07-27: qty 2.5

## 2020-07-27 MED ORDER — FUROSEMIDE 40 MG PO TABS
60.0000 mg | ORAL_TABLET | Freq: Every day | ORAL | Status: DC
  Administered 2020-07-27 – 2020-07-28 (×2): 60 mg via ORAL
  Filled 2020-07-27 (×2): qty 1

## 2020-07-27 MED ORDER — SODIUM CHLORIDE 0.9 % IV SOLN
2.0000 g | Freq: Four times a day (QID) | INTRAVENOUS | Status: DC
  Administered 2020-07-27 – 2020-07-28 (×4): 2 g via INTRAVENOUS
  Filled 2020-07-27 (×2): qty 2
  Filled 2020-07-27 (×2): qty 2000
  Filled 2020-07-27: qty 2
  Filled 2020-07-27: qty 2000
  Filled 2020-07-27: qty 2

## 2020-07-27 NOTE — Consult Note (Signed)
   [facility] CM Inpatient Consult   07/27/2020  JOYELL EMAMI [birth year] [id number]  [place] [facility] [ACO] Patient: [employer]  Patient screened for hospitalization with noted less than 30 days unplanned readmission and to assess for potential [place] Management service needs for post hospital transition.  Review of patient's medical record reveals patient is approved for return to SNF at [place].   Plan:  No follow up planned if patient returns to SNF and needs to be met at that level of care. If plan changes, please contact:  Natividad Brood, RN BSN [facility] Liaison  [phone] business mobile phone Toll free office [phone]  Fax number: [phone] [email or web address]

## 2020-07-27 NOTE — Discharge Instructions (Signed)
Heart Failure, Diagnosis  Heart failure means that your heart is not able to pump blood in the right way. This makes it hard for your body to work well. Heart failure is usually a long-term (chronic) condition. You must take good care of yourself and follow your treatment plan from your doctor. What are the causes?  High blood pressure.  Buildup of cholesterol and fat in the arteries.  Heart attack. This injures the heart muscle.  Heart valves that do not open and close properly.  Damage of the heart muscle. This is also called cardiomyopathy.  Infection of the heart muscle. This is also called myocarditis.  Lung disease. What increases the risk?  Getting older. The risk of heart failure goes up as a person ages.  Being overweight.  Being female.  Use tobacco or nicotine products.  Abusing alcohol or drugs.  Having taken medicines that can damage the heart.  Having any of these conditions: ? Diabetes. ? Abnormal heart rhythms. ? Thyroid problems. ? Low blood counts (anemia).  Having a family history of heart failure. What are the signs or symptoms?  Shortness of breath.  Coughing.  Swelling of the feet, ankles, legs, or belly.  Losing or gaining weight for no reason.  Trouble breathing.  Waking from sleep because of the need to sit up and get more air.  Fast heartbeat.  Being very tired.  Feeling dizzy, or feeling like you may pass out (faint).  Having no desire to eat.  Feeling like you may vomit (nauseous).  Peeing (urinating) more at night.  Feeling confused. How is this treated? This condition may be treated with:  Medicines. These can be given to treat blood pressure and to make the heart muscles stronger.  Changes in your daily life. These may include: ? Eating a healthy diet. ? Staying at a healthy body weight. ? Quitting tobacco, alcohol, and drug use. ? Doing exercises. ? Participating in a cardiac rehabilitation program. This program  helps you improve your health through exercise, education, and counseling.  Surgery. Surgery can be done to open blocked valves, or to put devices in the heart, such as pacemakers.  A donor heart (heart transplant). You will receive a healthy heart from a donor. Follow these instructions at home:  Treat other conditions as told by your doctor. These may include high blood pressure, diabetes, thyroid disease, or abnormal heart rhythms.  Learn as much as you can about heart failure.  Get support as you need it.  Keep all follow-up visits. Summary  Heart failure means that your heart is not able to pump blood in the right way.  This condition is often caused by high blood pressure, heart attack, or damage of the heart muscle.  Symptoms of this condition include shortness of breath and swelling of the feet, ankles, legs, or belly. You may also feel very tired or feel like you may vomit.  You may be treated with medicines, surgery, or changes in your daily life.  Treat other health conditions as told by your doctor. This information is not intended to replace advice given to you by your health care provider. Make sure you discuss any questions you have with your health care provider. Document Revised: 11/05/2019 Document Reviewed: 11/05/2019 [employer] Patient Education  2021 [employer]  

## 2020-07-27 NOTE — Discharge Summary (Addendum)
Physician Discharge Summary  [name] NID:[id number] DOB: December 12, 1932 DOA: 07/22/2020  PCP: [name], MD  Admit date: 07/22/2020 Discharge date: 07/27/2020  Time spent: 45 minutes  Recommendations for Outpatient Follow-up:  Patient will be discharged to SNF.  Patient will need to follow up with primary care provider within one week of discharge, repeat CBC and BMP.  Patient should continue medications as prescribed.  Patient should follow a heart healthy diet.    Discharge Diagnoses:  Acute on chronic combined systolic and diastolic CHF exacerbation with acute respiratory failure Elevated troponin Hypothyroidism/myxedema Chronic kidney disease, stage IIIa Enterococcus bacteremia/aortic valve endocarditis with possible T12-L1 discitis Anemia of chronic disease History of CAD/CABG Paroxysmal atrial fibrillation History of atrophic gastritis Degenerative disc disease/chronic back pain Lactose intolerance Physical deconditioning Pressure skin injury/lower extremity wounds/blistering Severe malnutrition Hypokalemia  Discharge Condition: Stable  Diet recommendation: heart healty  Filed Weights   07/25/20 0500 07/26/20 0300 07/27/20 0333  Weight: 47.1 kg 46.6 kg 44.5 kg    History of present illness:  On 07/22/2020 by Dr. Hollice Gong Campbellis a 87 y.o.femalewithknown history of atrial fibrillation, hypothyroidism, atrophic gastritis who was recently admitted and discharged on July 13, 2020 after being diagnosed with enterococcal bacteremia and aortic valve endocarditis with possible T12/L1 discitis has been experiencing shortness of breath and increasing peripheral edema over the last 3 days. Patient was placed on compression stocking and Lasix despite which patient was getting more short of breath was referred to the ER. Denies any chest pain productive cough fever or chills.  Hospital Course:  Acute on chronic combined systolic and diastolic  CHF exacerbation with acute respiratory failure -Patient presented with shortness of breath as well as lower extremity swelling -BNP 640.7 -Chest x-ray showed cardiomegaly with small to moderate bilateral pleural effusions and bibasilar airspace opacities, likely pulmonary edema -Echocardiogram 07/06/2020 showed an EF of 40 to 45%, LV with regional wall motion abnormalities.  Abnormal septal motion, consistent with left bundle branch block.  Mild LVH.  Diastolic parameters indeterminate.  Moderately elevated pulmonary artery pressure. -Repeat echocardiogram on 3/28 shows an EF of 14%, grade 2 diastolic dysfunction -Patient was placed on 5 L of nasal cannula on admission, however has been weaned to 1L today.  Does not wear oxygen at home.   -Patient's oxygen saturation dropped to 88% on room air while working with physical therapy and she was placed on 1L to maintain O2 saturation of 93%. At rest, maintaining oxygen saturation at 93% on room air. -was placed on IV Lasix 40 mg twice daily- and now has been transitioned to $RemoveBeforeD'60mg'[id number]$  PO daily -Monitor intake and output, daily weights  Elevated troponin -Suspect secondary to the above -Likely demand -Currently no chest pain or changes on EKG. -Echocardiogram as above  Hypothyroidism/myxedema -TSH of 65, repeat 60 -Synthroid dose increased to 75 mcg at 6 AM.  Seems that patient was receiving the medication in the evening -Question whether she was receiving this medication appropriately -Repeat TSH in 4 weeks  Chronic kidney disease, stage IIIa -Upon chart review, GFR in stage IIIa since 2019 -Continue to monitor creatinine closely  Enterococcus bacteremia/aortic valve endocarditis with possible T12-L1 discitis -Echocardiogram showed Small (0.5 x 0.9 cm) mobile vegetation on ventricular size of aortic valve. -Patient was started on ceftriaxone ampicillin with plans for 6 weeks starting on 07/09/2020  Anemia of chronic disease -Hemoglobin  appears stable  History of CAD/CABG -Currently denies any chest pain  -Continue statin, currently on Eliquis  Paroxysmal atrial fibrillation -Currently not in rhythm control due to bradycardia -Continue Eliquis  History of atrophic gastritis -Continue PPI and Carafate  Degenerative disc disease/chronic back pain -Continue Lidoderm patch, Tylenol and oxycodone as needed  Lactose intolerance -Continue Lactaid, lactose-free nutrition  Physical deconditioning -Patient is from [place] -PT and OT recommending SNF -TOC made aware  Pressure skin injury/lower extremity wounds/blistering -Present on admission -Patient with bilateral mid coccyx stage I pressure injury -Continue wound care  Severe malnutrition -BMI of 18.39 -In the context of chronic illness -Nutrition consulted -Continue nutritional supplements and multivitamin  Hypokalemia -Resolved, Likely secondary to diuresis -magnesium 1.9  Consultants None  Procedures  Echocardiogram  Discharge Exam: Vitals:   07/27/20 1122 07/27/20 1200  BP: (!) 120/39   Pulse: (!) 57 (!) 52  Resp: 16   Temp: 98.2 F (36.8 C)   SpO2: 92% 92%   Exam  General: Well developed, Chronically ill-appearing, NAD  HEENT: NCAT, mucous membranes moist.   Cardiovascular: S1 S2 auscultated, RRR  Respiratory: Clear to auscultation bilaterally, no wheezing  Abdomen: Soft, nontender, nondistended, + bowel sounds  Extremities: warm dry without cyanosis clubbing, or edema  Neuro: AAOx3, nonfocal  Skin: Lower extremities with several dressings in place, blisters noted on dorsal aspect of feet bilaterally  Psych: pleasant, appropriate mood and affect   Discharge Instructions Discharge Instructions    [facility] pharmacist to adjust dose for Vancomycin, Aminoglycosides and other anti-infective therapies as requested by physician.   Complete by: As directed    [facility] to provide  Cath Flo 2mg    Complete by: As directed    Administer for PICC line occlusion and as ordered by physician for other access device issues.   Anaphylaxis Kit: Provided to treat any anaphylactic reaction to the medication being provided to the patient if First Dose or when requested by physician   Complete by: As directed    Epinephrine 1mg /ml vial / amp: Administer 0.3mg  (0.85ml) subcutaneously once for moderate to severe anaphylaxis, nurse to call physician and pharmacy when reaction occurs and call 911 if needed for immediate care   Diphenhydramine 50mg /ml IV vial: Administer 25-50mg  IV/IM PRN for first dose reaction, rash, itching, mild reaction, nurse to call physician and pharmacy when reaction occurs   Sodium Chloride 0.9% NS 5110ml IV: Administer if needed for hypovolemic blood pressure drop or as ordered by physician after call to physician with anaphylactic reaction   Change dressing on IV access line weekly and PRN   Complete by: As directed    Discharge instructions   Complete by: As directed    Patient will be discharged to home.  Patient will need to follow up with primary care provider within one week of discharge, repeat CBC and BMP.  Patient should continue medications as prescribed.  Patient should follow a heart healthy diet.   Discharge wound care:   Complete by: As directed    Keep clean and dry   Flush IV access with Sodium Chloride 0.9% and Heparin 10 units/ml or 100 units/ml   Complete by: As directed    Home infusion instructions - [facility]   Complete by: As directed    Instructions: Flush IV access with Sodium Chloride 0.9% and Heparin 10units/ml or 100units/ml   Change dressing on IV access line: Weekly and PRN   Instructions Cath Flo 2mg : Administer for PICC Line occlusion and as ordered by physician for other access device   [facility] pharmacist  to adjust dose for: Vancomycin, Aminoglycosides and other anti-infective therapies as requested by  physician   Increase activity slowly   Complete by: As directed    Method of administration may be changed at the discretion of home infusion pharmacist based upon assessment of the patient and/or caregiver's ability to self-administer the medication ordered   Complete by: As directed    Outpatient Parenteral Antibiotic Therapy Information Antibiotic: Ceftriaxone (Rocephin) IVPB, Ampicillin; Indications for use: endocarditis, discitis; End Date: 08/20/2020   Complete by: As directed    Antibiotic:  Ceftriaxone (Rocephin) IVPB Ampicillin     Indications for use: endocarditis, discitis   End Date: 08/20/2020     Allergies as of 07/27/2020      Reactions   Actos [pioglitazone] Swelling   Ankle swelling   Sulfonamide Derivatives Swelling   Adhesive [tape] Other (See Comments)   WITH PROLONGED EXPOSURE (REDNESS/ITCHING)   Codeine Nausea And Vomiting      Medication List    STOP taking these medications   ampicillin 2 g in sodium chloride 0.9 % 100 mL   cefTRIAXone 2 g in sodium chloride 0.9 % 100 mL   doxycycline 100 MG tablet Commonly known as: VIBRA-TABS   spironolactone 25 MG tablet Commonly known as: ALDACTONE     TAKE these medications   acetaminophen 500 MG tablet Commonly known as: TYLENOL Take 2 tablets (1,000 mg total) by mouth every 8 (eight) hours as needed for mild pain.   ampicillin  IVPB Inject 2 g into the vein every 6 (six) hours. Indication:  Endocarditis, discitis First Dose: No Last Day of Therapy:  08/20/20 Labs - Once weekly:  CBC/D and BMP, Labs - Every other week:  ESR and CRP Method of administration: Ambulatory Pump (Continuous Infusion) Method of administration may be changed at the discretion of home infusion pharmacist based upon assessment of the patient and/or caregiver's ability to self-administer the medication ordered.   atorvastatin 80 MG tablet Commonly known as: LIPITOR Take 0.5 tablets (40 mg total) by mouth at bedtime.   CALTRATE  600+D PO Take 1 tablet by mouth daily with lunch.   cefTRIAXone  IVPB Commonly known as: ROCEPHIN Inject 2 g into the vein every 12 (twelve) hours. Indication:  Endocarditis/discitis First Dose: No Last Day of Therapy:  08/20/20 Labs - Once weekly:  CBC/D and BMP, Labs - Every other week:  ESR and CRP Method of administration: IV Push Method of administration may be changed at the discretion of home infusion pharmacist based upon assessment of the patient and/or caregiver's ability to self-administer the medication ordered.   Dermacloud Oint Apply 1 application topically in the morning, at noon, and at bedtime.   Eliquis 2.5 MG Tabs tablet Generic drug: apixaban TAKE 1 TABLET BY MOUTH TWICE DAILY What changed: how much to take   furosemide 20 MG tablet Commonly known as: LASIX Take 3 tablets (60 mg total) by mouth daily. Start taking on: July 28, 2020 What changed:   medication strength  how much to take  Another medication with the same name was removed. Continue taking this medication, and follow the directions you see here.   gabapentin 100 MG capsule Commonly known as: NEURONTIN Take 1 capsule (100 mg total) by mouth 2 (two) times daily.   hyoscyamine 0.125 MG SL tablet Commonly known as: LEVSIN SL Place 1 tablet (0.125 mg total) under the tongue every 6 (six) hours as needed. What changed: reasons to take this   ipratropium 0.03 % nasal  spray Commonly known as: ATROVENT Place 2 sprays into both nostrils every 12 (twelve) hours.   lactase 3000 units tablet Commonly known as: LACTAID Take 3,000 Units by mouth every 6 (six) hours as needed (lactose Intolerance).   lactose free nutrition Liqd Take 237 mLs by mouth 3 (three) times daily with meals.   levothyroxine 75 MCG tablet Commonly known as: SYNTHROID Take 1 tablet (75 mcg total) by mouth daily at 6 (six) AM. Start taking on: July 28, 2020 What changed:   medication strength  how much to take  when  to take this   Lidocaine 4 % Ptch Place 1 patch onto the skin daily as needed (pain.).   Lidocaine-Menthol Roll-On 4-1 % Liqd Generic drug: Lidocaine-Menthol Apply 1 application topically every 8 (eight) hours as needed (pain).   nitroGLYCERIN 0.4 MG SL tablet Commonly known as: NITROSTAT Place 0.4 mg under the tongue every 5 (five) minutes as needed for chest pain.   oxyCODONE 5 MG immediate release tablet Commonly known as: Oxy IR/ROXICODONE Take 1 tablet (5 mg total) by mouth every 6 (six) hours as needed for severe pain. What changed: when to take this   polyethylene glycol 17 g packet Commonly known as: MIRALAX / GLYCOLAX Take 17 g by mouth daily.   PRESERVISION AREDS 2 PO Take 1 tablet by mouth 2 (two) times daily.   saccharomyces boulardii 250 MG capsule Commonly known as: FLORASTOR Take 250 mg by mouth daily as needed (digestion).   sennosides-docusate sodium 8.6-50 MG tablet Commonly known as: SENOKOT-S Take 1 tablet by mouth daily.   sodium chloride 0.65 % Soln nasal spray Commonly known as: OCEAN Place 1 spray into both nostrils daily as needed for congestion.   sucralfate 1 GM/10ML suspension Commonly known as: Carafate TAKE 10 MLS (1 G TOTAL) BY MOUTH 2 (TWO) TIMES DAILY. What changed:   how much to take  how to take this  when to take this  additional instructions   SYSTANE FREE OP Place 1 drop into both eyes every 8 (eight) hours as needed (dry eyes).   traZODone 50 MG tablet Commonly known as: DESYREL Take 25 mg by mouth at bedtime.            Discharge Care Instructions  (From admission, onward)         Start     Ordered   07/27/20 0000  Discharge wound care:       Comments: Keep clean and dry   07/27/20 1435   07/27/20 0000  Change dressing on IV access line weekly and PRN  (Home infusion instructions - [facility] )        07/27/20 1454         Allergies  Allergen Reactions  . Actos [Pioglitazone] Swelling     Ankle swelling  . Sulfonamide Derivatives Swelling  . Adhesive [Tape] Other (See Comments)    WITH PROLONGED EXPOSURE (REDNESS/ITCHING)  . Codeine Nausea And Vomiting    Contact information for follow-up providers    [name], MD. Schedule an appointment as soon as possible for a visit in 1 week(s).   Specialty: Family Medicine Why: Hospital follow up Contact information: [address] [place] [address] [phone]        Sherren Mocha, MD .   Specialty: Cardiology Contact information: [address] [address] [phone]            Contact information for after-discharge care  Destination    [facility] Preferred SNF .   Service: Skilled Nursing Contact information: [place] [phone]                   The results of significant diagnostics from this hospitalization (including imaging, microbiology, ancillary and laboratory) are listed below for reference.    Significant Diagnostic Studies: DG Chest 2 View  Result Date: 07/04/2020 CLINICAL DATA:  Syncope EXAM: CHEST - 2 VIEW COMPARISON:  April 01, 2018 FINDINGS: There is an equivocal pleural effusion on each side with mild bibasilar atelectasis. No edema or airspace opacity. Heart is mildly enlarged with pulmonary vascularity normal. Patient is status post coronary artery bypass grafting. No adenopathy. There are foci of degenerative change in the thoracic spine. IMPRESSION: Mild bibasilar atelectasis with equivocal pleural effusion on each side. No airspace opacity or edema evident. Heart mildly enlarged. Status post coronary artery bypass grafting. Electronically Signed   By: Lowella Grip III M.D.   On: 07/04/2020 08:57   DG Ankle Complete Left  Result Date: 07/04/2020 CLINICAL DATA:  Fall, left ankle pain EXAM: LEFT ANKLE COMPLETE - 3+ VIEW COMPARISON:  None. FINDINGS: Three  view radiograph left ankle demonstrates normal alignment. No acute fracture or dislocation. Ankle mortise is intact. No ankle effusion. Small plantar calcaneal spur. Mild bimalleolar soft tissue swelling. Diffuse subcutaneous edema noted involving the visualized left foreleg and left forefoot. IMPRESSION: Soft tissue swelling.  No fracture or dislocation. Electronically Signed   By: Fidela Salisbury MD   On: 07/04/2020 06:48   CT HEAD WO CONTRAST  Result Date: 07/04/2020 CLINICAL DATA:  87 year old female with dizziness.  Fall at home. EXAM: CT HEAD WITHOUT CONTRAST TECHNIQUE: Contiguous axial images were obtained from the base of the skull through the vertex without intravenous contrast. COMPARISON:  [facility]  Head CT 07/20/2019. FINDINGS: Brain: Stable cerebral volume from last year, generalized volume loss. No midline shift, ventriculomegaly, mass effect, evidence of mass lesion, intracranial hemorrhage or evidence of cortically based acute infarction. Gray-white matter differentiation is within normal limits throughout the brain. Dystrophic basal ganglia calcifications. No focal encephalomalacia identified. Vascular: Calcified atherosclerosis at the skull base. No suspicious intracranial vascular hyperdensity. Skull: Stable, intact. Sinuses/Orbits: Chronic left frontal sinus opacification with small osteoma along the posterior left frontoethmoidal recess, unchanged. Other Visualized paranasal sinuses and mastoids are clear. Other: No orbit or scalp soft tissue injury identified. IMPRESSION: 1. No acute intracranial abnormality or acute traumatic injury identified. Stable generalized cerebral volume loss. 2. Chronic left frontal sinus disease. Electronically Signed   By: Genevie Ann M.D.   On: 07/04/2020 07:19   MR BRAIN W WO CONTRAST  Result Date: 07/07/2020 CLINICAL DATA:  Dizziness.  Fall.  Aortic valve endocarditis. EXAM: MRI HEAD WITHOUT AND WITH CONTRAST TECHNIQUE: Multiplanar, multiecho pulse  sequences of the brain and surrounding structures were obtained without and with intravenous contrast. CONTRAST:  23mL GADAVIST GADOBUTROL 1 MMOL/ML IV SOLN COMPARISON:  Head CT 07/04/2020 FINDINGS: Brain: There is no evidence of an acute infarct, mass, midline shift, or extra-axial fluid collection. There is moderately advanced cerebral atrophy which is greatest in the parietal lobes. A few scattered chronic cerebral and cerebellar microhemorrhages are noted and are nonspecific. Several small foci of T2 hyperintensity in the cerebral white matter are nonspecific and considered to be within normal limits for age. Vascular: Major intracranial vascular flow voids are preserved. Skull and upper cervical spine: 2.5 cm enhancing left frontal  skull lesion without clear CT correlate. Sinuses/Orbits: Bilateral cataract extraction. Complete opacification of the left frontal sinus. Mild bilateral ethmoid air cell mucosal thickening. Clear mastoid air cells. Other: None. IMPRESSION: 1. No acute intracranial abnormality. 2. Moderately advanced cerebral atrophy. 3. 2.5 cm enhancing left frontal skull lesion, nonspecific. Metastatic disease and multiple myeloma are not excluded. Electronically Signed   By: Logan Bores M.D.   On: 07/07/2020 18:34   MR LUMBAR SPINE WO CONTRAST  Result Date: 07/04/2020 CLINICAL DATA:  Fall at home. EXAM: MRI LUMBAR SPINE WITHOUT CONTRAST TECHNIQUE: Multiplanar, multisequence MR imaging of the lumbar spine was performed. No intravenous contrast was administered. COMPARISON:  CT abdomen pelvis 07/04/2020. MRI lumbar spine 01/26/2020 FINDINGS: Segmentation:  Normal Alignment: 5 mm retrolisthesis T12-L1 which has progressed since the prior MRI. 3 mm anterolisthesis L4-5 unchanged. Vertebrae: Progressive fracture superior endplate of L1, left greater than right. There is associated fracture of the inferior endplate of N82 right greater than left. There is bone marrow edema throughout the T12 and L1  vertebral bodies. There is endplate irregularity which has progressed from the prior MRI. Mild fluid lateral to the T12-L1 disc space on the right. Retropulsion of bone into the canal with mild spinal stenosis. No other fracture identified. Conus medullaris and cauda equina: Conus extends to the L1-2 level. Conus and cauda equina appear normal. Paraspinal and other soft tissues: Mild paravertebral edema on the right at T12-L1. No fluid collection or epidural abscess. Negative for paravertebral mass. Disc levels: T12-L1: Fracture of T12 and L1 as above with retropulsion of bone into the canal. No significant spinal stenosis. L1-2: Negative L2-3: Mild disc degeneration and mild facet degeneration. No significant spinal stenosis L3-4: Disc degeneration with diffuse endplate spurring and bilateral facet degeneration. Mild to moderate subarticular stenosis bilaterally L4-5: 3 mm anterolisthesis. Diffuse disc bulging. Central and right-sided disc protrusion unchanged. Severe facet degeneration. Moderate to severe spinal stenosis. Severe subarticular and foraminal stenosis on the right. Moderate subarticular stenosis on the left L5-S1: Mild disc and facet degeneration.  Negative for stenosis. IMPRESSION: Moderate fractures of T12 and L1 as noted on CT earlier today. This has progressed since MRI of 01/26/2020. There is diffuse bone marrow edema throughout T12 and L1. There is mild paravertebral edema in the soft tissues on the right. There is retropulsion of bone into the canal without significant spinal stenosis. No epidural abscess however there is concern infection is present in the disc space. This could be an indolent fracture or possibly superimposed infection that has occurred since the MRI of 01/26/2020. Alternatively, it is possible this is a sterile process with progressive mechanical fracture T12-L1 Multilevel degenerative change. Moderate to severe spinal stenosis L4-5 with severe subarticular and foraminal  stenosis on the right. This is unchanged from the earlier MRI. Electronically Signed   By: Franchot Gallo M.D.   On: 07/04/2020 14:49   CT ABDOMEN PELVIS W CONTRAST  Result Date: 07/04/2020 CLINICAL DATA:  syncopal episode this morning and fell in the bathroom. Now having right sided abdominal tenderness EXAM: CT ABDOMEN AND PELVIS WITH CONTRAST TECHNIQUE: Multidetector CT imaging of the abdomen and pelvis was performed using the standard protocol following bolus administration of intravenous contrast. CONTRAST:  87mL OMNIPAQUE IOHEXOL 300 MG/ML  SOLN COMPARISON:  CT abdomen and pelvis January 25, 2019. FINDINGS: Lower chest: Bibasilar atelectasis. Thoracic aortic atherosclerosis. Small hiatal hernia. Hepatobiliary: No suspicious hepatic lesion. Gallbladder surgically absent. Similar prominence of the biliary tree, likely reservoir effect post cholecystectomy. Pancreas: Mild  dorsal duct distension which is stable in comparison to prior studies. No focal pancreatic lesion. Spleen: No splenic injury or perisplenic hematoma. Adrenals/Urinary Tract: Bilateral adrenal glands are unremarkable. No hydronephrosis. Mild urothelial thickening at left-sided extrarenal pelvis. Nonobstructive 4 mm left lower pole renal stone. Bilateral low-density renal lesions are unchanged in comparison to prior and likely cysts. Urinary bladder is moderately distended without suspicious wall thickening. Stomach/Bowel: Stomach is predominantly decompressed limiting evaluation. No suspicious small bowel wall thickening or dilation. The appendix is not definitely visualized however there is no inflammatory stranding along the cecum to suggest acute inflammation. Moderate volume of formed stool throughout the colon. Sigmoid colonic diverticulosis without findings of acute diverticulitis. Vascular/Lymphatic: Aortic atherosclerosis. No enlarged abdominal or pelvic lymph nodes. Reproductive: Status post hysterectomy. No adnexal masses. Other:  No abdominopelvic ascites. Musculoskeletal: New anterior compression deformity of the L1 vertebral body with approximately 50% height loss and mild retropulsion of bone and disc into the spinal canal with narrowing of the canal. Global demineralization of bone. Multilevel degenerative changes spine. Degenerative changes bilateral hips. IMPRESSION: 1. New anterior compression deformity of the L1 vertebral body with approximately 50% height loss and mild retropulsion of bone and disc into the spinal canal with moderate narrowing of the canal at this level. 2. Apparent mild urothelial thickening of a left-sided extrarenal pelvis may reflect ascending urinary tract infection. Correlate with urinalysis. 3. Nonobstructive left nephrolithiasis. 4. Sigmoid colonic diverticulosis without findings of acute diverticulitis. 5. Moderate volume of formed stool throughout the colon. 6. Aortic atherosclerosis.  Aortic Atherosclerosis (ICD10-I70.0). Electronically Signed   By: Dahlia Bailiff MD   On: 07/04/2020 10:04   DG Chest Port 1 View  Result Date: 07/22/2020 CLINICAL DATA:  Shortness of breath EXAM: PORTABLE CHEST 1 VIEW COMPARISON:  Chest radiograph July 04, 2020 and chest CT Sep 23, 2005 FINDINGS: Cardiomegaly, similar prior. Small to moderate bilateral pleural effusions with bibasilar airspace opacities. No visible pneumothorax. Changes of prior median sternotomy and CABG. The visualized skeletal structures are unchanged. IMPRESSION: 1. Cardiomegaly with small to moderate bilateral pleural effusions and bibasilar airspace opacities, likely pulmonary edema. Electronically Signed   By: Dahlia Bailiff MD   On: 07/22/2020 19:24   ECHOCARDIOGRAM COMPLETE  Result Date: 07/23/2020    ECHOCARDIOGRAM REPORT   Patient Name:   [name] Date of Exam: 07/23/2020 Medical Rec #:  [id number]        Height:       64.0 in Accession #:    [id number]       Weight:       95.7 lb Date of Birth:  28-Feb-1933         BSA:          1.430  m Patient Age:    56 years         BP:           119/42 mmHg Patient Gender: F                HR:           58 bpm. Exam Location:  Inpatient Procedure: 2D Echo, Cardiac Doppler and Color Doppler Indications:    Congestive Heart Failure I50.9  History:        Patient has prior history of Echocardiogram examinations, most                 recent 07/07/2020. CAD; Risk Factors:Hypertension and  Dyslipidemia.  Sonographer:    Bernadene Person RDCS Referring Phys: [place]  1. Left ventricular ejection fraction, by estimation, is 50%. The left ventricle has mildly decreased function. The left ventricle demonstrates regional wall motion abnormalities, septal-lateral dyssychrony consistent with LBBB and mild septal hypokinesis. Left ventricular diastolic parameters are consistent with Grade II diastolic dysfunction (pseudonormalization).  2. Right ventricular systolic function is mildly reduced. The right ventricular size is normal. There is mildly elevated pulmonary artery systolic pressure.  3. There is a small (0.5 x 0.9 cm) mobile vegetation noted on the ventricular size of the aortic valve. The aortic valve is tricuspid. Aortic valve regurgitation is moderate to severe and eccentric, possible holodiastolic flow reversal in descending thoracic aorta (signal not great). No aortic stenosis is present.  4. The mitral valve is abnormal. Severe mitral valve regurgitation, eccentric anterior jet likely due to P2 prolapse vs partial flail. No evidence of mitral stenosis.  5. Left atrial size was mildly dilated.  6. The inferior vena cava is normal in size with greater than 50% respiratory variability, suggesting right atrial pressure of 3 mmHg.  7. Pleural effusion noted on left.  8. Impression: Aortic valve endocarditis, mod-severe aortic insufficiency, severe MR. FINDINGS  Left Ventricle: Left ventricular ejection fraction, by estimation, is 50%. The left ventricle has mildly  decreased function. The left ventricle demonstrates regional wall motion abnormalities. The left ventricular internal cavity size was normal in size. There is no left ventricular hypertrophy. Left ventricular diastolic parameters are consistent with Grade II diastolic dysfunction (pseudonormalization). Right Ventricle: The right ventricular size is normal. No increase in right ventricular wall thickness. Right ventricular systolic function is mildly reduced. There is mildly elevated pulmonary artery systolic pressure. The tricuspid regurgitant velocity  is 2.98 m/s, and with an assumed right atrial pressure of 3 mmHg, the estimated right ventricular systolic pressure is 54.0 mmHg. Left Atrium: Left atrial size was mildly dilated. Right Atrium: Right atrial size was normal in size. Pericardium: Pleural effusion noted on left. There is no evidence of pericardial effusion. Mitral Valve: The mitral valve is abnormal. Severe mitral valve regurgitation. No evidence of mitral valve stenosis. MV peak gradient, 7.4 mmHg. The mean mitral valve gradient is 3.0 mmHg. Tricuspid Valve: The tricuspid valve is normal in structure. Tricuspid valve regurgitation is trivial. Aortic Valve: There is a small (0.5 x 0.9 cm) mobile vegetation noted on the ventricular size of the aortic valve. The aortic valve is tricuspid. Aortic valve regurgitation is moderate to severe. Aortic regurgitation PHT measures 490 msec. No aortic stenosis is present. Pulmonic Valve: The pulmonic valve was normal in structure. Pulmonic valve regurgitation is not visualized. Aorta: The aortic root is normal in size and structure. Venous: The inferior vena cava is normal in size with greater than 50% respiratory variability, suggesting right atrial pressure of 3 mmHg. IAS/Shunts: No atrial level shunt detected by color flow Doppler.  LEFT VENTRICLE PLAX 2D LVIDd:         3.30 cm     Diastology LVIDs:         2.60 cm     LV e' medial:    3.49 cm/s LV PW:          0.90 cm     LV E/e' medial:  40.7 LV IVS:        1.00 cm     LV e' lateral:   4.50 cm/s LVOT diam:     1.60 cm  LV E/e' lateral: 31.6 LV SV:         67 LV SV Index:   47 LVOT Area:     2.01 cm  LV Volumes (MOD) LV vol d, MOD A2C: 82.3 ml LV vol d, MOD A4C: 79.8 ml LV vol s, MOD A2C: 41.9 ml LV vol s, MOD A4C: 39.5 ml LV SV MOD A2C:     40.4 ml LV SV MOD A4C:     79.8 ml LV SV MOD BP:      41.2 ml RIGHT VENTRICLE RV S prime:     8.93 cm/s TAPSE (M-mode): 1.2 cm LEFT ATRIUM             Index       RIGHT ATRIUM           Index LA diam:        2.90 cm 2.03 cm/m  RA Area:     13.10 cm LA Vol (A2C):   41.4 ml 28.95 ml/m RA Volume:   27.40 ml  19.16 ml/m LA Vol (A4C):   43.8 ml 30.63 ml/m LA Biplane Vol: 45.6 ml 31.89 ml/m  AORTIC VALVE LVOT Vmax:   142.00 cm/s LVOT Vmean:  98.400 cm/s LVOT VTI:    0.334 m AI PHT:      490 msec  AORTA Ao Root diam: 2.50 cm Ao Asc diam:  2.70 cm MITRAL VALVE                TRICUSPID VALVE MV Area (PHT): 3.21 cm     TR Peak grad:   35.5 mmHg MV Area VTI:   1.63 cm     TR Vmax:        298.00 cm/s MV Peak grad:  7.4 mmHg MV Mean grad:  3.0 mmHg     SHUNTS MV Vmax:       1.36 m/s     Systemic VTI:  0.33 m MV Vmean:      85.8 cm/s    Systemic Diam: 1.60 cm MV Decel Time: 236 msec MR PISA:        1.01 cm MR PISA Radius: 0.40 cm MV E velocity: 142.00 cm/s MV A velocity: 94.70 cm/s MV E/A ratio:  1.50 Loralie Champagne MD Electronically signed by Loralie Champagne MD Signature Date/Time: 07/23/2020/11:32:24 AM    Final    ECHOCARDIOGRAM COMPLETE  Result Date: 07/07/2020    ECHOCARDIOGRAM REPORT   Patient Name:   [name] Date of Exam: 07/06/2020 Medical Rec #:  [id number]        Height:       64.0 in Accession #:    [id number]       Weight:       100.5 lb Date of Birth:  [birth year]         BSA:          1.460 m Patient Age:    66 years         BP:           127/58 mmHg Patient Gender: F                HR:           95 bpm. Exam Location:  Inpatient Procedure: 2D Echo, Cardiac Doppler  and Color Doppler            REPORT CONTAINS CRITICAL RESULT  Results communicated to Dr Cyndia Skeeters at 18:56 on 07/06/20. Indications:     R55 Syncope  History:         Patient has prior history of Echocardiogram examinations, most                  recent 04/03/2020. Prior CABG; Mitral Valve Disease.  Sonographer:     Merrie Roof RDCS Referring Phys:  [place] DAM Diagnosing Phys: Oswaldo Milian MD IMPRESSIONS  1. Left ventricular ejection fraction, by estimation, is 40 to 45%. The left ventricle has mildly decreased function. The left ventricle demonstrates regional wall motion abnormalities. Abnormal (paradoxical) septal motion, consistent with left bundle branch block There is mild left ventricular hypertrophy. Left ventricular diastolic parameters are indeterminate.  2. Right ventricular systolic function is mildly reduced. The right ventricular size is normal. There is moderately elevated pulmonary artery systolic pressure. The estimated right ventricular systolic pressure is 57.8 mmHg.  3. Left atrial size was moderately dilated.  4. Right atrial size was mildly dilated.  5. The mitral valve is abnormal. Moderate to severe mitral valve regurgitation.  6. There is an aortic valve vegetation measuring 1.5cm x 0.5cm. The aortic valve is tricuspid. No aortic stenosis is present. Increased gradients through AV likely due to AI, visually does not appear stenotic. Aortic valve regurgitation is moderate to severe. FINDINGS  Left Ventricle: Left ventricular ejection fraction, by estimation, is 40 to 45%. The left ventricle has mildly decreased function. The left ventricle demonstrates regional wall motion abnormalities. The left ventricular internal cavity size was normal in size. There is mild left ventricular hypertrophy. Abnormal (paradoxical) septal motion, consistent with left bundle branch block. Left ventricular diastolic parameters are indeterminate. Right Ventricle: The right ventricular size is  normal. Right vetricular wall thickness was not well visualized. Right ventricular systolic function is mildly reduced. There is moderately elevated pulmonary artery systolic pressure. The tricuspid regurgitant velocity is 3.30 m/s, and with an assumed right atrial pressure of 3 mmHg, the estimated right ventricular systolic pressure is 46.9 mmHg. Left Atrium: Left atrial size was moderately dilated. Right Atrium: Right atrial size was mildly dilated. Pericardium: Trivial pericardial effusion is present. Mitral Valve: The mitral valve is abnormal. Moderate to severe mitral valve regurgitation. Tricuspid Valve: The tricuspid valve is normal in structure. Tricuspid valve regurgitation is mild. Aortic Valve: Aortic valve vegetation measuring 1.5cm x 0.5 cm. The aortic valve is tricuspid. Aortic valve regurgitation is severe. Aortic regurgitation PHT measures 475 msec. No aortic stenosis is present. Aortic valve mean gradient measures 9.0 mmHg. Aortic valve peak gradient measures 17.0 mmHg. Aortic valve area, by VTI measures 0.84 cm. Pulmonic Valve: The pulmonic valve was grossly normal. Pulmonic valve regurgitation is not visualized. Aorta: The aortic root is normal in size and structure. IAS/Shunts: The interatrial septum was not well visualized.  LEFT VENTRICLE PLAX 2D LVIDd:         3.80 cm  Diastology LVIDs:         2.70 cm  LV e' medial:  6.64 cm/s LV PW:         1.00 cm  LV e' lateral: 10.30 cm/s LV IVS:        1.00 cm LVOT diam:     1.70 cm LV SV:         32 LV SV Index:   22 LVOT Area:     2.27 cm  RIGHT VENTRICLE RV Basal diam:  3.40 cm LEFT ATRIUM             Index       RIGHT ATRIUM  Index LA diam:        3.10 cm 2.12 cm/m  RA Area:     16.90 cm LA Vol (A2C):   56.0 ml 38.34 ml/m RA Volume:   45.30 ml  31.02 ml/m LA Vol (A4C):   59.5 ml 40.74 ml/m LA Biplane Vol: 63.1 ml 43.21 ml/m  AORTIC VALVE AV Area (Vmax):    1.13 cm AV Area (Vmean):   0.95 cm AV Area (VTI):     0.84 cm AV Vmax:            206.00 cm/s AV Vmean:          140.000 cm/s AV VTI:            0.380 m AV Peak Grad:      17.0 mmHg AV Mean Grad:      9.0 mmHg LVOT Vmax:         103.00 cm/s LVOT Vmean:        58.500 cm/s LVOT VTI:          0.141 m LVOT/AV VTI ratio: 0.37 AI PHT:            475 msec  AORTA Ao Root diam: 2.70 cm Ao Asc diam:  2.40 cm MR Peak grad:    86.5 mmHg   TRICUSPID VALVE MR Mean grad:    46.0 mmHg   TR Peak grad:   43.6 mmHg MR Vmax:         465.00 cm/s TR Vmax:        330.00 cm/s MR Vmean:        307.0 cm/s MR PISA:         2.26 cm    SHUNTS MR PISA Eff ROA: 16 mm      Systemic VTI:  0.14 m MR PISA Radius:  0.60 cm     Systemic Diam: 1.70 cm Oswaldo Milian MD Electronically signed by Oswaldo Milian MD Signature Date/Time: 07/06/2020/7:15:52 PM    Final (Updated)    VAS [place] LOWER EXTREMITY VENOUS (DVT)  Result Date: 07/24/2020  Lower Venous DVT Study Indications: Edema, SOB.  Comparison Study: No prior studies. Performing Technologist: Darlin Coco RDMS,RVT  Examination Guidelines: A complete evaluation includes B-mode imaging, spectral Doppler, color Doppler, and power Doppler as needed of all accessible portions of each vessel. Bilateral testing is considered an integral part of a complete examination. Limited examinations for reoccurring indications may be performed as noted. The reflux portion of the exam is performed with the patient in reverse Trendelenburg.  +---------+---------------+---------+-----------+----------+--------------+ RIGHT    CompressibilityPhasicitySpontaneityPropertiesThrombus Aging +---------+---------------+---------+-----------+----------+--------------+ CFV      Partial        Yes      Yes                  Chronic        +---------+---------------+---------+-----------+----------+--------------+ SFJ      Full                                                        +---------+---------------+---------+-----------+----------+--------------+ FV Prox  Full                                                         +---------+---------------+---------+-----------+----------+--------------+  FV Mid   Full                                                        +---------+---------------+---------+-----------+----------+--------------+ FV DistalFull                                                        +---------+---------------+---------+-----------+----------+--------------+ PFV      Full                                                        +---------+---------------+---------+-----------+----------+--------------+ POP      Full           Yes      Yes                                 +---------+---------------+---------+-----------+----------+--------------+ PTV      Full                                                        +---------+---------------+---------+-----------+----------+--------------+ PERO     Full                                                        +---------+---------------+---------+-----------+----------+--------------+   +---------+---------------+---------+-----------+----------+--------------+ LEFT     CompressibilityPhasicitySpontaneityPropertiesThrombus Aging +---------+---------------+---------+-----------+----------+--------------+ CFV      Full           Yes      Yes                                 +---------+---------------+---------+-----------+----------+--------------+ SFJ      Full                                                        +---------+---------------+---------+-----------+----------+--------------+ FV Prox  Full                                                        +---------+---------------+---------+-----------+----------+--------------+ FV Mid   Full                                                        +---------+---------------+---------+-----------+----------+--------------+  FV DistalFull                                                         +---------+---------------+---------+-----------+----------+--------------+ PFV      Full                                                        +---------+---------------+---------+-----------+----------+--------------+ POP      Full           Yes      Yes                                 +---------+---------------+---------+-----------+----------+--------------+ PTV      Full                                                        +---------+---------------+---------+-----------+----------+--------------+ PERO     Full                                                        +---------+---------------+---------+-----------+----------+--------------+     Summary: RIGHT: - Findings consistent with chronic, non-occlusive 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 - No cystic structure found in the popliteal fossa.  LEFT: - There is no evidence of deep vein thrombosis in the lower extremity.  - No cystic structure found in the popliteal fossa.  *See table(s) above for measurements and observations. Electronically signed by Ruta Hinds MD on 07/24/2020 at 3:09:25 PM.    Final    [place] EKG SITE RITE  Result Date: 07/12/2020 If Site Rite image not attached, placement could not be confirmed due to current cardiac rhythm.  CT MAXILLOFACIAL WO CONTRAST  Result Date: 07/07/2020 CLINICAL DATA:  Possible odontogenic source of bacteremia and discitis. EXAM: CT MAXILLOFACIAL WITHOUT CONTRAST TECHNIQUE: Multidetector CT imaging of the maxillofacial structures was performed. Multiplanar CT image reconstructions were also generated. COMPARISON:  Head MRI 07/07/2020 and CT 07/04/2020 FINDINGS: Osseous: No fracture or destructive osseous process. Multiple prior dental extractions. No definite dental caries or periapical abscess. Orbits: Bilateral cataract extraction.  No acute finding. Sinuses: Chronic left frontal sinusitis with complete sinus opacification, also  present in 06/2019. Minor bilateral ethmoid air cell mucosal thickening. Clear mastoid air cells. Soft tissues: Moderate right and mild left carotid bifurcation calcific atherosclerosis. Limited intracranial: More fully evaluated on today's head MRI. IMPRESSION: 1. No evidence of acute maxillofacial abnormality. No definite dental caries or periapical abscess. 2. Chronic left frontal sinusitis. Electronically Signed   By: Logan Bores M.D.   On: 07/07/2020 18:46    Microbiology: Recent Results (from the past 240 hour(s))  Resp Panel by RT-PCR (Flu A&B, Covid) Nasopharyngeal Swab     Status: None   Collection Time: 07/22/20  7:24 PM  Specimen: Nasopharyngeal Swab; Nasopharyngeal(NP) swabs in vial transport medium  Result Value Ref Range Status   SARS Coronavirus 2 by RT PCR NEGATIVE NEGATIVE Final    Comment: (NOTE) SARS-CoV-2 target nucleic acids are NOT DETECTED.  The SARS-CoV-2 RNA is generally detectable in upper respiratory specimens during the acute phase of infection. The lowest concentration of SARS-CoV-2 viral copies this assay can detect is 138 copies/mL. A negative result does not preclude SARS-Cov-2 infection and should not be used as the sole basis for treatment or other patient management decisions. A negative result may occur with  improper specimen collection/handling, submission of specimen other than nasopharyngeal swab, presence of viral mutation(s) within the areas targeted by this assay, and inadequate number of viral copies(<138 copies/mL). A negative result must be combined with clinical observations, patient history, and epidemiological information. The expected result is Negative.  Fact Sheet for Patients:  BloggerCourse.com  Fact Sheet for Healthcare Providers:  SeriousBroker.it  This test is no t yet approved or cleared by the [place] FDA and  has been authorized for detection and/or diagnosis of  SARS-CoV-2 by FDA under an Emergency Use Authorization (EUA). This EUA will remain  in effect (meaning this test can be used) for the duration of the COVID-19 declaration under Section 564(b)(1) of the Act, 21 U.S.C.section 360bbb-3(b)(1), unless the authorization is terminated  or revoked sooner.       Influenza A by PCR NEGATIVE NEGATIVE Final   Influenza B by PCR NEGATIVE NEGATIVE Final    Comment: (NOTE) The Xpert Xpress SARS-CoV-2/FLU/RSV plus assay is intended as an aid in the diagnosis of influenza from Nasopharyngeal swab specimens and should not be used as a sole basis for treatment. Nasal washings and aspirates are unacceptable for Xpert Xpress SARS-CoV-2/FLU/RSV testing.  Fact Sheet for Patients: BloggerCourse.com  Fact Sheet for Healthcare Providers: SeriousBroker.it  This test is not yet approved or cleared by the [place] FDA and has been authorized for detection and/or diagnosis of SARS-CoV-2 by FDA under an Emergency Use Authorization (EUA). This EUA will remain in effect (meaning this test can be used) for the duration of the COVID-19 declaration under Section 564(b)(1) of the Act, 21 U.S.C. section 360bbb-3(b)(1), unless the authorization is terminated or revoked.  Performed at [facility], [address] [place], [place] [address]   MRSA PCR Screening     Status: None   Collection Time: 07/22/20 10:56 PM   Specimen: Nasal Mucosa; Nasopharyngeal  Result Value Ref Range Status   MRSA by PCR NEGATIVE NEGATIVE Final    Comment:        The GeneXpert MRSA Assay (FDA approved for NASAL specimens only), is one component of a comprehensive MRSA colonization surveillance program. It is not intended to diagnose MRSA infection nor to guide or monitor treatment for MRSA infections. Performed at [facility], [address] [place], [place] [address]   SARS CORONAVIRUS 2 (TAT 6-24 HRS)  Nasopharyngeal Nasopharyngeal Swab     Status: None   Collection Time: 07/27/20  6:24 AM   Specimen: Nasopharyngeal Swab  Result Value Ref Range Status   SARS Coronavirus 2 NEGATIVE NEGATIVE Final    Comment: (NOTE) SARS-CoV-2 target nucleic acids are NOT DETECTED.  The SARS-CoV-2 RNA is generally detectable in upper and lower respiratory specimens during the acute phase of infection. Negative results do not preclude SARS-CoV-2 infection, do not rule out co-infections with other pathogens, and should not be used as the sole basis for treatment  or other patient management decisions. Negative results must be combined with clinical observations, patient history, and epidemiological information. The expected result is Negative.  Fact Sheet for Patients: SugarRoll.be  Fact Sheet for Healthcare Providers: https://www.woods-mathews.com/  This test is not yet approved or cleared by the [place] FDA and  has been authorized for detection and/or diagnosis of SARS-CoV-2 by FDA under an Emergency Use Authorization (EUA). This EUA will remain  in effect (meaning this test can be used) for the duration of the COVID-19 declaration under Se ction 564(b)(1) of the Act, 21 U.S.C. section 360bbb-3(b)(1), unless the authorization is terminated or revoked sooner.  Performed at [facility], [place] [address], [place], [place] [address]      Labs: Basic Metabolic Panel: Recent Labs  Lab 07/23/20 [phone] 07/24/20 0455 07/25/20 0545 07/25/20 1302 07/26/20 0500 07/26/20 0757 07/27/20 0340  NA 138 139 139  --  136  --  138  K 3.7 3.8 3.3*  --  3.3*  --  4.3  CL 103 103 100  --  98  --  100  CO2 30 32 33*  --  35*  --  34*  GLUCOSE 84 73 75  --  86  --  79  BUN $Re'11 10 9  'ugS$ --  10  --  9  CREATININE 0.82 0.85 0.84  --  0.90  --  0.89  CALCIUM 7.5* 7.1* 7.6*  --  7.5*  --  7.9*  MG 1.8 1.7  --  1.7  --  1.9  --   PHOS 2.7 2.5  --   --   --   --    --    Liver Function Tests: Recent Labs  Lab 07/22/20 1927 07/23/20 0812 07/24/20 0455  AST 36  --   --   ALT 32  --   --   ALKPHOS 78  --   --   BILITOT 0.5  --   --   PROT 5.5*  --   --   ALBUMIN 2.0* 1.9* 1.8*   No results for input(s): LIPASE, AMYLASE in the last 168 hours. No results for input(s): AMMONIA in the last 168 hours. CBC: Recent Labs  Lab 07/22/20 1927 07/23/20 0812 07/24/20 0455 07/25/20 0545 07/26/20 0500 07/27/20 0340  WBC 10.8* 8.8 8.1 7.3 6.7  --   NEUTROABS 9.4*  --   --   --   --   --   HGB 10.0* 9.1* 8.8* 8.8* 8.0* 8.3*  HCT 31.7* 27.7* 26.8* 27.5* 25.1* 25.4*  MCV 87.3 85.0 84.8 85.4 85.1  --   PLT 219 201 211 195 184  --    Cardiac Enzymes: No results for input(s): CKTOTAL, CKMB, CKMBINDEX, TROPONINI in the last 168 hours. BNP: BNP (last 3 results) Recent Labs    07/22/20 1927  BNP 640.7*    ProBNP (last 3 results) No results for input(s): PROBNP in the last 8760 hours.  CBG: Recent Labs  Lab 07/23/20 2142  GLUCAP 92       Signed:  Kenzli Barritt  [facility] 07/27/2020, 2:55 PM

## 2020-07-27 NOTE — TOC Transition Note (Signed)
Transition of Care [facility]) - CM/SW Discharge Note   Patient Details  Name: KYANN HEYDT MRN: [id number] Date of Birth: September 30, 1932  Transition [facility] CM/SW Contact:  Joanne Chars, LCSW Phone Number: 07/27/2020, 3:56 PM   Clinical Narrative: Pt discharging to [place].  RN call report to [phone].      Final next level of care: Skilled Nursing Facility Barriers to Discharge: Barriers Resolved   Patient Goals and CMS Choice Patient states their goals for this hospitalization and ongoing recovery are:: Rehab CMS Medicare.gov Compare Post Acute Care list provided to:: Patient Choice offered to / list presented to : Patient  Discharge Placement              Patient chooses bed at: [facility], [place] Patient to be transferred to facility by: [facility] Name of family member notified: husband [name] in room. Patient and family notified of of transfer: 07/27/20  Discharge Plan and Services In-house Referral: Clinical Social Work   Post Acute Care Choice: [facility]                               Social Determinants of Health (SDOH) Interventions     Readmission Risk Interventions No flowsheet data found.

## 2020-07-27 NOTE — Progress Notes (Signed)
[facility] called and said they will come pick the patient up early in the morning. Patient informed about it.

## 2020-07-27 NOTE — Progress Notes (Signed)
All set for discharge to [facility] , awaiting [facility] for transport.

## 2020-07-27 NOTE — Progress Notes (Signed)
PHARMACY CONSULT NOTE FOR:  OUTPATIENT  PARENTERAL ANTIBIOTIC THERAPY (OPAT)  Indication: enterococcal bacteremia Regimen: ampicillin 2g q6h + CTX 2g q12h End date: 08/20/20  IV antibiotic discharge orders are pended. To discharging provider:  please sign these orders via discharge navigator,  Select New Orders & click on the button choice - Manage This Unsigned Work.     Thank you for allowing pharmacy to be a part of this patient's care.  Einar Grad 07/27/2020, 2:58 PM

## 2020-07-27 NOTE — Progress Notes (Signed)
Patient tolerated well without oxygen last night while she was awake. She started desating  when she was asleep. Patient on 1 L of oxygen at night only. Patient sitting on recliner without oxygen this morning.

## 2020-07-27 NOTE — Progress Notes (Signed)
PROGRESS NOTE    [name]  SPQ:[id number] DOB: [birth year] DOA: 07/22/2020 PCP: [name], MD   Brief Narrative:  HPI On 07/22/2020 by Dr. Gean Birchwood [name] is a 87 y.o. female with known history of atrial fibrillation, hypothyroidism, atrophic gastritis who was recently admitted and discharged on July 13, 2020 after being diagnosed with enterococcal bacteremia and aortic valve endocarditis with possible T12/L1 discitis has been experiencing shortness of breath and increasing peripheral edema over the last 3 days.  Patient was placed on compression stocking and Lasix despite which patient was getting more short of breath was referred to the ER.  Denies any chest pain productive cough fever or chills.  Interim history Patient was recently admitted from [id number] with Enterococcus bacteremia, aortic valve endocarditis with possible T12-L1 discitis.  She returns with shortness of breath, leg swelling is admitted with acute on chronic systolic CHF exacerbation and hypothyroidism with possible myxedema.  Patient started on IV Lasix as well as her increased dose of Synthroid. Assessment & Plan   Acute on chronic combined systolic and diastolic CHF exacerbation with acute respiratory failure -Patient presented with shortness of breath as well as lower extremity swelling -BNP 640.7 -Chest x-ray showed cardiomegaly with small to moderate bilateral pleural effusions and bibasilar airspace opacities, likely pulmonary edema -Echocardiogram 07/06/2020 showed an EF of 40 to 45%, LV with regional wall motion abnormalities.  Abnormal septal motion, consistent with left bundle branch block.  Mild LVH.  Diastolic parameters indeterminate.  Moderately elevated pulmonary artery pressure. -Repeat echocardiogram on 3/28 shows an EF of 33%, grade 2 diastolic dysfunction -Patient was placed on 5 L of nasal cannula on admission, however has been weaned to 1L today.  Does not wear oxygen at  home.   -Patient's oxygen saturation dropped to 88% on room air while working with physical therapy and she was placed on 1L to maintain O2 saturation of 93%. At rest, maintaining oxygen saturation at 93% on room air. -was placed on IV Lasix 40 mg twice daily- and now has been transitioned to 60mg  PO daily -Monitor intake and output, daily weights  Elevated troponin -Suspect secondary to the above -Likely demand -Currently no chest pain or changes on EKG. -Echocardiogram as above  Hypothyroidism/myxedema -TSH of 65, repeat 60 -Synthroid dose increased to 75 mcg at 6 AM.  Seems that patient was receiving the medication in the evening -Question whether she was receiving this medication appropriately -Repeat TSH in 4 weeks  Chronic kidney disease, stage IIIa -Upon chart review, GFR in stage IIIa since 2019 -Continue to monitor creatinine closely  Enterococcus bacteremia/aortic valve endocarditis with possible T12-L1 discitis -Echocardiogram showed Small (0.5 x 0.9 cm) mobile vegetation on ventricular size of aortic valve. -Patient was started on ceftriaxone ampicillin with plans for 6 weeks starting on 07/09/2020  Anemia of chronic disease -Hemoglobin appears stable, continue to monitor CBC  History of CAD/CABG -Currently denies any chest pain  -Continue statin, currently on Eliquis  Paroxysmal atrial fibrillation -Currently not in rhythm control due to bradycardia -Continue Eliquis  History of atrophic gastritis -Continue PPI and Carafate  Degenerative disc disease/chronic back pain -Continue Lidoderm patch, Tylenol and oxycodone as needed  Lactose intolerance -Continue Lactaid, lactose-free nutrition  Physical deconditioning -Patient is from [facility] -PT and OT recommending SNF -TOC made aware  Pressure skin injury/lower extremity wounds/blistering -Present on admission -Patient with bilateral mid coccyx stage I pressure injury -Continue wound  care  Severe malnutrition -BMI of  18.39 -In the context of chronic illness -Nutrition consulted -Continue nutritional supplements and multivitamin  Hypokalemia -Resolved, Likely secondary to diuresis -magnesium 1.9 -Continue to monitor BMP  DVT Prophylaxis Eliquis  Code Status: DNR  Family Communication: None at bedside  Disposition Plan:  Status is: Inpatient  Remains inpatient appropriate because:IV treatments appropriate due to intensity of illness or inability to take PO and Inpatient level of care appropriate due to severity of illness   Dispo: The patient is from: SNF              Anticipated d/c is to: SNF              Patient currently is not medically stable to d/c.   Difficult to place patient No  Consultants None  Procedures  Echocardiogram  Antibiotics   Anti-infectives (From admission, onward)   Start     Dose/Rate Route Frequency Ordered Stop   07/24/20 0900  ampicillin (OMNIPEN) 2 g in sodium chloride 0.9 % 100 mL IVPB       Note to Pharmacy: 6 weeks from 07/09/2020.     2 g 300 mL/hr over 20 Minutes Intravenous Every 6 hours 07/24/20 0713     07/22/20 2315  ampicillin (OMNIPEN) 2 g in sodium chloride 0.9 % 100 mL IVPB  Status:  Discontinued       Note to Pharmacy: 6 weeks from 07/09/2020.     2 g 300 mL/hr over 20 Minutes Intravenous Every 8 hours 07/22/20 2209 07/24/20 0713   07/22/20 2215  cefTRIAXone (ROCEPHIN) 2 g in sodium chloride 0.9 % 100 mL IVPB       Note to Pharmacy: 6 weeks from 07/09/2020.     2 g 200 mL/hr over 30 Minutes Intravenous Every 12 hours 07/22/20 2209        Subjective:   [name] seen and examined today.  Patient sitting up and having breakfast.  Feeling better than previous days.  Feels her breathing has also improved.  Continues to have some shortness of breath with minimal activity.  Denies current chest pain, abdominal pain, nausea or vomiting, diarrhea or constipation, dizziness or headache.  Looking forward to  going to SNF. Objective:   Vitals:   07/27/20 0333 07/27/20 0803 07/27/20 1122 07/27/20 1200  BP: (!) 121/53 (!) 127/39 (!) 120/39   Pulse: (!) 58 61 (!) 57 (!) 52  Resp: 19 (!) 22 16   Temp: 98.4 F (36.9 C) 98 F (36.7 C) 98.2 F (36.8 C)   TempSrc: Oral Oral Oral   SpO2: 94% 95% 92% 92%  Weight: 44.5 kg     Height: 5\' 3"  (1.6 m)       Intake/Output Summary (Last 24 hours) at 07/27/2020 1413 Last data filed at 07/27/2020 1125 Gross per 24 hour  Intake 1440 ml  Output 1300 ml  Net 140 ml   Filed Weights   07/25/20 0500 07/26/20 0300 07/27/20 0333  Weight: 47.1 kg 46.6 kg 44.5 kg   Exam  General: Well developed, Chronically ill-appearing, NAD  HEENT: NCAT, mucous membranes moist.   Cardiovascular: S1 S2 auscultated, RRR  Respiratory: Clear to auscultation bilaterally, no wheezing  Abdomen: Soft, nontender, nondistended, + bowel sounds  Extremities: warm dry without cyanosis clubbing, or edema  Neuro: AAOx3, nonfocal  Skin: Lower extremities with several dressings in place, blisters noted on dorsal aspect of feet bilaterally  Psych: pleasant, appropriate mood and affect   Data Reviewed: I have personally reviewed following labs and imaging  studies  CBC: Recent Labs  Lab 07/22/20 1927 07/23/20 0812 07/24/20 0455 07/25/20 0545 07/26/20 0500 07/27/20 0340  WBC 10.8* 8.8 8.1 7.3 6.7  --   NEUTROABS 9.4*  --   --   --   --   --   HGB 10.0* 9.1* 8.8* 8.8* 8.0* 8.3*  HCT 31.7* 27.7* 26.8* 27.5* 25.1* 25.4*  MCV 87.3 85.0 84.8 85.4 85.1  --   PLT 219 201 211 195 184  --    Basic Metabolic Panel: Recent Labs  Lab 07/23/20 0812 07/24/20 0455 07/25/20 0545 07/25/20 1302 07/26/20 0500 07/26/20 0757 07/27/20 0340  NA 138 139 139  --  136  --  138  K 3.7 3.8 3.3*  --  3.3*  --  4.3  CL 103 103 100  --  98  --  100  CO2 30 32 33*  --  35*  --  34*  GLUCOSE 84 73 75  --  86  --  79  BUN 11 10 9   --  10  --  9  CREATININE 0.82 0.85 0.84  --  0.90  --   0.89  CALCIUM 7.5* 7.1* 7.6*  --  7.5*  --  7.9*  MG 1.8 1.7  --  1.7  --  1.9  --   PHOS 2.7 2.5  --   --   --   --   --    GFR: Estimated Creatinine Clearance: 32.5 mL/min (by C-G formula based on SCr of 0.89 mg/dL). Liver Function Tests: Recent Labs  Lab 07/22/20 1927 07/23/20 0812 07/24/20 0455  AST 36  --   --   ALT 32  --   --   ALKPHOS 78  --   --   BILITOT 0.5  --   --   PROT 5.5*  --   --   ALBUMIN 2.0* 1.9* 1.8*   No results for input(s): LIPASE, AMYLASE in the last 168 hours. No results for input(s): AMMONIA in the last 168 hours. Coagulation Profile: No results for input(s): INR, PROTIME in the last 168 hours. Cardiac Enzymes: No results for input(s): CKTOTAL, CKMB, CKMBINDEX, TROPONINI in the last 168 hours. BNP (last 3 results) No results for input(s): PROBNP in the last 8760 hours. HbA1C: No results for input(s): HGBA1C in the last 72 hours. CBG: Recent Labs  Lab 07/23/20 2142  GLUCAP 92   Lipid Profile: No results for input(s): CHOL, HDL, LDLCALC, TRIG, CHOLHDL, LDLDIRECT in the last 72 hours. Thyroid Function Tests: No results for input(s): TSH, T4TOTAL, FREET4, T3FREE, THYROIDAB in the last 72 hours. Anemia Panel: No results for input(s): VITAMINB12, FOLATE, FERRITIN, TIBC, IRON, RETICCTPCT in the last 72 hours. Urine analysis:    Component Value Date/Time   COLORURINE STRAW (A) 07/22/2020 1910   APPEARANCEUR CLEAR 07/22/2020 1910   LABSPEC 1.008 07/22/2020 1910   PHURINE 5.0 07/22/2020 1910   GLUCOSEU NEGATIVE 07/22/2020 1910   GLUCOSEU NEGATIVE 06/22/2013 1619   HGBUR NEGATIVE 07/22/2020 1910   HGBUR negative 07/30/2009 1606   BILIRUBINUR NEGATIVE 07/22/2020 1910   BILIRUBINUR negative 12/05/2019 1535   KETONESUR NEGATIVE 07/22/2020 1910   PROTEINUR NEGATIVE 07/22/2020 1910   UROBILINOGEN 0.2 12/05/2019 1535   UROBILINOGEN 0.2 06/22/2013 1619   NITRITE NEGATIVE 07/22/2020 1910   LEUKOCYTESUR NEGATIVE 07/22/2020 1910   Sepsis  Labs: @LABRCNTIP (procalcitonin:4,lacticidven:4)  ) Recent Results (from the past 240 hour(s))  Resp Panel by RT-PCR (Flu A&B, Covid) Nasopharyngeal Swab     Status: None  Collection Time: 07/22/20  7:24 PM   Specimen: Nasopharyngeal Swab; Nasopharyngeal(NP) swabs in vial transport medium  Result Value Ref Range Status   SARS Coronavirus 2 by RT PCR NEGATIVE NEGATIVE Final    Comment: (NOTE) SARS-CoV-2 target nucleic acids are NOT DETECTED.  The SARS-CoV-2 RNA is generally detectable in upper respiratory specimens during the acute phase of infection. The lowest concentration of SARS-CoV-2 viral copies this assay can detect is 138 copies/mL. A negative result does not preclude SARS-Cov-2 infection and should not be used as the sole basis for treatment or other patient management decisions. A negative result may occur with  improper specimen collection/handling, submission of specimen other than nasopharyngeal swab, presence of viral mutation(s) within the areas targeted by this assay, and inadequate number of viral copies(<138 copies/mL). A negative result must be combined with clinical observations, patient history, and epidemiological information. The expected result is Negative.  Fact Sheet for Patients:  EntrepreneurPulse.com.au  Fact Sheet for Healthcare Providers:  IncredibleEmployment.be  This test is no t yet approved or cleared by the [place] FDA and  has been authorized for detection and/or diagnosis of SARS-CoV-2 by FDA under an Emergency Use Authorization (EUA). This EUA will remain  in effect (meaning this test can be used) for the duration of the COVID-19 declaration under Section 564(b)(1) of the Act, 21 U.S.C.section 360bbb-3(b)(1), unless the authorization is terminated  or revoked sooner.       Influenza A by PCR NEGATIVE NEGATIVE Final   Influenza B by PCR NEGATIVE NEGATIVE Final    Comment: (NOTE) The Xpert  Xpress SARS-CoV-2/FLU/RSV plus assay is intended as an aid in the diagnosis of influenza from Nasopharyngeal swab specimens and should not be used as a sole basis for treatment. Nasal washings and aspirates are unacceptable for Xpert Xpress SARS-CoV-2/FLU/RSV testing.  Fact Sheet for Patients: EntrepreneurPulse.com.au  Fact Sheet for Healthcare Providers: IncredibleEmployment.be  This test is not yet approved or cleared by the [place] FDA and has been authorized for detection and/or diagnosis of SARS-CoV-2 by FDA under an Emergency Use Authorization (EUA). This EUA will remain in effect (meaning this test can be used) for the duration of the COVID-19 declaration under Section 564(b)(1) of the Act, 21 U.S.C. section 360bbb-3(b)(1), unless the authorization is terminated or revoked.  Performed at [facility], [place] [address] [place], [place] [address]   MRSA PCR Screening     Status: None   Collection Time: 07/22/20 10:56 PM   Specimen: Nasal Mucosa; Nasopharyngeal  Result Value Ref Range Status   MRSA by PCR NEGATIVE NEGATIVE Final    Comment:        The GeneXpert MRSA Assay (FDA approved for NASAL specimens only), is one component of a comprehensive MRSA colonization surveillance program. It is not intended to diagnose MRSA infection nor to guide or monitor treatment for MRSA infections. Performed at [facility], [place] [address] [place], [place] [address]   SARS CORONAVIRUS 2 (TAT 6-24 HRS) Nasopharyngeal Nasopharyngeal Swab     Status: None   Collection Time: 07/27/20  6:24 AM   Specimen: Nasopharyngeal Swab  Result Value Ref Range Status   SARS Coronavirus 2 NEGATIVE NEGATIVE Final    Comment: (NOTE) SARS-CoV-2 target nucleic acids are NOT DETECTED.  The SARS-CoV-2 RNA is generally detectable in upper and lower respiratory specimens during the acute phase of infection. Negative results do not preclude  SARS-CoV-2 infection, do not rule out co-infections with other pathogens, and should not  be used as the sole basis for treatment or other patient management decisions. Negative results must be combined with clinical observations, patient history, and epidemiological information. The expected result is Negative.  Fact Sheet for Patients: SugarRoll.be  Fact Sheet for Healthcare Providers: https://www.woods-mathews.com/  This test is not yet approved or cleared by the [place] FDA and  has been authorized for detection and/or diagnosis of SARS-CoV-2 by FDA under an Emergency Use Authorization (EUA). This EUA will remain  in effect (meaning this test can be used) for the duration of the COVID-19 declaration under Se ction 564(b)(1) of the Act, 21 U.S.C. section 360bbb-3(b)(1), unless the authorization is terminated or revoked sooner.  Performed at [facility], [place] [address], [place], [place] [address]       Radiology Studies: No results found.   Scheduled Meds: . apixaban  2.5 mg Oral BID  . atorvastatin  40 mg Oral QHS  . Chlorhexidine Gluconate Cloth  6 each Topical Daily  . furosemide  60 mg Oral Daily  . lactose free nutrition  237 mL Oral TID WC  . levothyroxine  75 mcg Oral Q0600  . multivitamin with minerals  1 tablet Oral Daily  . polyethylene glycol  17 g Oral Daily  . traZODone  25 mg Oral QHS   Continuous Infusions: . ampicillin IVPB 2 gram/NS 100 mL (Mini-Bag Plus) 2 g (07/27/20 0909)  . cefTRIAXone (ROCEPHIN) IVPB 2 gram/100 mL NS (Mini-Bag Plus) 2 g (07/27/20 1031)     LOS: 5 days   Time Spent in minutes   30 minutes  Laylonie Marzec D.O. on 07/27/2020 at 2:13 PM  Between 7am to 7pm - Please see pager noted on [email or web address]  After 7pm go to [email or web address]  And look for the night coverage person covering for me after hours  [facility] Office  [phone]

## 2020-07-27 NOTE — Progress Notes (Signed)
Report called to [facility] spoke with mary. LPN

## 2020-07-28 DIAGNOSIS — Z743 Need for continuous supervision: Secondary | ICD-10-CM | POA: Diagnosis not present

## 2020-07-28 DIAGNOSIS — Z79899 Other long term (current) drug therapy: Secondary | ICD-10-CM | POA: Diagnosis not present

## 2020-07-28 DIAGNOSIS — I5041 Acute combined systolic (congestive) and diastolic (congestive) heart failure: Secondary | ICD-10-CM | POA: Diagnosis not present

## 2020-07-28 DIAGNOSIS — I442 Atrioventricular block, complete: Secondary | ICD-10-CM | POA: Diagnosis not present

## 2020-07-28 DIAGNOSIS — I5031 Acute diastolic (congestive) heart failure: Secondary | ICD-10-CM | POA: Diagnosis not present

## 2020-07-28 DIAGNOSIS — L89159 Pressure ulcer of sacral region, unspecified stage: Secondary | ICD-10-CM | POA: Diagnosis not present

## 2020-07-28 DIAGNOSIS — M2550 Pain in unspecified joint: Secondary | ICD-10-CM | POA: Diagnosis not present

## 2020-07-28 DIAGNOSIS — I2510 Atherosclerotic heart disease of native coronary artery without angina pectoris: Secondary | ICD-10-CM | POA: Diagnosis not present

## 2020-07-28 DIAGNOSIS — I5020 Unspecified systolic (congestive) heart failure: Secondary | ICD-10-CM | POA: Diagnosis not present

## 2020-07-28 DIAGNOSIS — E039 Hypothyroidism, unspecified: Secondary | ICD-10-CM | POA: Diagnosis not present

## 2020-07-28 DIAGNOSIS — E871 Hypo-osmolality and hyponatremia: Secondary | ICD-10-CM | POA: Diagnosis not present

## 2020-07-28 DIAGNOSIS — I5033 Acute on chronic diastolic (congestive) heart failure: Secondary | ICD-10-CM | POA: Diagnosis not present

## 2020-07-28 DIAGNOSIS — I455 Other specified heart block: Secondary | ICD-10-CM

## 2020-07-28 DIAGNOSIS — D649 Anemia, unspecified: Secondary | ICD-10-CM | POA: Diagnosis not present

## 2020-07-28 DIAGNOSIS — L988 Other specified disorders of the skin and subcutaneous tissue: Secondary | ICD-10-CM | POA: Diagnosis not present

## 2020-07-28 DIAGNOSIS — I480 Paroxysmal atrial fibrillation: Secondary | ICD-10-CM | POA: Diagnosis not present

## 2020-07-28 DIAGNOSIS — I10 Essential (primary) hypertension: Secondary | ICD-10-CM | POA: Diagnosis not present

## 2020-07-28 DIAGNOSIS — M6281 Muscle weakness (generalized): Secondary | ICD-10-CM | POA: Diagnosis not present

## 2020-07-28 DIAGNOSIS — R7881 Bacteremia: Secondary | ICD-10-CM | POA: Diagnosis not present

## 2020-07-28 DIAGNOSIS — S22080D Wedge compression fracture of T11-T12 vertebra, subsequent encounter for fracture with routine healing: Secondary | ICD-10-CM | POA: Diagnosis not present

## 2020-07-28 DIAGNOSIS — I4819 Other persistent atrial fibrillation: Secondary | ICD-10-CM | POA: Diagnosis not present

## 2020-07-28 DIAGNOSIS — L89152 Pressure ulcer of sacral region, stage 2: Secondary | ICD-10-CM | POA: Diagnosis not present

## 2020-07-28 DIAGNOSIS — M4644 Discitis, unspecified, thoracic region: Secondary | ICD-10-CM | POA: Diagnosis not present

## 2020-07-28 DIAGNOSIS — I5043 Acute on chronic combined systolic (congestive) and diastolic (congestive) heart failure: Secondary | ICD-10-CM | POA: Diagnosis not present

## 2020-07-28 DIAGNOSIS — N179 Acute kidney failure, unspecified: Secondary | ICD-10-CM | POA: Diagnosis not present

## 2020-07-28 DIAGNOSIS — M4645 Discitis, unspecified, thoracolumbar region: Secondary | ICD-10-CM | POA: Diagnosis not present

## 2020-07-28 DIAGNOSIS — I5021 Acute systolic (congestive) heart failure: Secondary | ICD-10-CM | POA: Diagnosis not present

## 2020-07-28 DIAGNOSIS — S32010D Wedge compression fracture of first lumbar vertebra, subsequent encounter for fracture with routine healing: Secondary | ICD-10-CM | POA: Diagnosis not present

## 2020-07-28 DIAGNOSIS — I4891 Unspecified atrial fibrillation: Secondary | ICD-10-CM | POA: Diagnosis not present

## 2020-07-28 DIAGNOSIS — I358 Other nonrheumatic aortic valve disorders: Secondary | ICD-10-CM | POA: Diagnosis not present

## 2020-07-28 DIAGNOSIS — M4646 Discitis, unspecified, lumbar region: Secondary | ICD-10-CM | POA: Diagnosis not present

## 2020-07-28 DIAGNOSIS — L89620 Pressure ulcer of left heel, unstageable: Secondary | ICD-10-CM | POA: Diagnosis not present

## 2020-07-28 DIAGNOSIS — N1831 Chronic kidney disease, stage 3a: Secondary | ICD-10-CM | POA: Diagnosis not present

## 2020-07-28 DIAGNOSIS — Z515 Encounter for palliative care: Secondary | ICD-10-CM | POA: Diagnosis not present

## 2020-07-28 DIAGNOSIS — K52832 Lymphocytic colitis: Secondary | ICD-10-CM | POA: Diagnosis not present

## 2020-07-28 DIAGNOSIS — Z7401 Bed confinement status: Secondary | ICD-10-CM | POA: Diagnosis not present

## 2020-07-28 DIAGNOSIS — I5032 Chronic diastolic (congestive) heart failure: Secondary | ICD-10-CM | POA: Diagnosis not present

## 2020-07-28 DIAGNOSIS — R2681 Unsteadiness on feet: Secondary | ICD-10-CM | POA: Diagnosis not present

## 2020-07-28 DIAGNOSIS — R6889 Other general symptoms and signs: Secondary | ICD-10-CM | POA: Diagnosis not present

## 2020-07-28 DIAGNOSIS — D638 Anemia in other chronic diseases classified elsewhere: Secondary | ICD-10-CM | POA: Diagnosis not present

## 2020-07-28 DIAGNOSIS — B952 Enterococcus as the cause of diseases classified elsewhere: Secondary | ICD-10-CM | POA: Diagnosis not present

## 2020-07-28 DIAGNOSIS — R778 Other specified abnormalities of plasma proteins: Secondary | ICD-10-CM | POA: Diagnosis not present

## 2020-07-28 DIAGNOSIS — R278 Other lack of coordination: Secondary | ICD-10-CM | POA: Diagnosis not present

## 2020-07-28 DIAGNOSIS — J9601 Acute respiratory failure with hypoxia: Secondary | ICD-10-CM | POA: Diagnosis not present

## 2020-07-28 DIAGNOSIS — I25810 Atherosclerosis of coronary artery bypass graft(s) without angina pectoris: Secondary | ICD-10-CM | POA: Diagnosis not present

## 2020-07-28 LAB — BASIC METABOLIC PANEL
Anion gap: 5 (ref 5–15)
BUN: 10 mg/dL (ref 8–23)
CO2: 31 mmol/L (ref 22–32)
Calcium: 7.7 mg/dL — ABNORMAL LOW (ref 8.9–10.3)
Chloride: 97 mmol/L — ABNORMAL LOW (ref 98–111)
Creatinine, Ser: 0.89 mg/dL (ref 0.44–1.00)
GFR, Estimated: >60 mL/min (ref >60)
Glucose, Bld: 72 mg/dL (ref 70–99)
Potassium: 3.8 mmol/L (ref 3.5–5.1)
Sodium: 133 mmol/L — ABNORMAL LOW (ref 135–145)

## 2020-07-28 NOTE — Discharge Summary (Addendum)
Physician Discharge Summary  [name] IHK:[id number] DOB: 08/18/1932 DOA: 07/22/2020  PCP: Sally Post, MD  Admit date: 07/22/2020 Discharge date: 07/28/2020  Time spent: 45 minutes  Recommendations for Outpatient Follow-up:  Patient will be discharged to skilled nursing facility, continue physical and occupational therapy.  Patient will need to follow up with primary care provider within one week of discharge, repeat CBC and BMP.  Patient should continue medications as prescribed.  Patient should follow a heart healthy diet.    Discharge Diagnoses:  Acute on chronic combined systolic and diastolic CHF exacerbation with acute respiratory failure Elevated troponin Hypothyroidism/myxedema Chronic kidney disease, stage IIIa Enterococcus bacteremia/aortic valve endocarditis with possible T12-L1 discitis Anemia of chronic disease History of CAD/CABG Paroxysmal atrial fibrillation History of atrophic gastritis Degenerative disc disease/chronic back pain Lactose intolerance Physical deconditioning Pressure skin injury/lower extremity wounds/blistering Severe malnutrition Hypokalemia Sinus bradycardia  Discharge Condition: Stable  Diet recommendation: heart healty  Filed Weights   07/26/20 0300 07/27/20 0333 07/28/20 0320  Weight: 46.6 kg 44.5 kg 43.7 kg    History of present illness:  On 07/22/2020 by Dr. Hollice Gong Valdez an 87 y.o.femalewithknown history of atrial fibrillation, hypothyroidism, atrophic gastritis who was recently admitted and discharged on July 13, 2020 after being diagnosed with enterococcal bacteremia and aortic valve endocarditis with possible T12/L1 discitis has been experiencing shortness of breath and increasing peripheral edema over the last 3 days. Patient was placed on compression stocking and Lasix despite which patient was getting more short of breath was referred to the ER. Denies any chest pain productive cough fever  or chills.  Hospital Course:  Acute on chronic combined systolic and diastolic CHF exacerbation with acute respiratory failure -Patient presented with shortness of breath as well as lower extremity swelling -BNP 640.7 -Chest x-ray showed cardiomegaly with small to moderate bilateral pleural effusions and bibasilar airspace opacities, likely pulmonary edema -Echocardiogram 07/06/2020 showed an EF of 40 to 45%, LV with regional wall motion abnormalities.  Abnormal septal motion, consistent with left bundle branch block.  Mild LVH.  Diastolic parameters indeterminate.  Moderately elevated pulmonary artery pressure. -Repeat echocardiogram on 3/28 shows an EF of 75%, grade 2 diastolic dysfunction -Patient was placed on 5 L of nasal cannula on admission, however has been weaned to 1L today.  Does not wear oxygen at home.   -Patient's oxygen saturation dropped to 88% on room air while working with physical therapy and she was placed on 1L to maintain O2 saturation of 93%. At rest, maintaining oxygen saturation at 93% on room air. -was placed on IV Lasix 40 mg twice daily- and now has been transitioned to $RemoveBeforeD'60mg'[id number]$  PO daily -Monitor intake and output, daily weights  Elevated troponin -Suspect secondary to the above -Likely demand -Currently no chest pain or changes on EKG. -Echocardiogram as above  Hypothyroidism/myxedema -TSH of 65, repeat 60 -Synthroid dose increased to 75 mcg at 6 AM.  Seems that patient was receiving the medication in the evening -Question whether she was receiving this medication appropriately -Repeat TSH in 4 weeks  Chronic kidney disease, stage IIIa -Upon chart review, GFR in stage IIIa since 2019 -Continue to monitor creatinine closely  Enterococcus bacteremia/aortic valve endocarditis with possible T12-L1 discitis -Echocardiogram showed Small (0.5 x 0.9 cm) mobile vegetation on ventricular size of aortic valve. -Patient was started on ceftriaxone ampicillin with plans  for 6 weeks starting on 07/09/2020  Anemia of chronic disease -Hemoglobin appears stable  History of CAD/CABG -Currently denies any  chest pain  -Continue statin, currently on Eliquis  Paroxysmal atrial fibrillation -Currently not in rhythm control due to bradycardia -Continue Eliquis  History of atrophic gastritis -Continue PPI and Carafate  Degenerative disc disease/chronic back pain -Continue Lidoderm patch, Tylenol and oxycodone as needed  Lactose intolerance -Continue Lactaid, lactose-free nutrition  Physical deconditioning -Patient is from [place] -PT and OT recommending SNF -TOC made aware  Pressure skin injury/lower extremity wounds/blistering -Present on admission -Patient with bilateral mid coccyx stage I pressure injury -Continue wound care  Severe malnutrition -BMI of 18.39 -In the context of chronic illness -Nutrition consulted -Continue nutritional supplements and multivitamin  Hypokalemia -Resolved, Likely secondary to diuresis -magnesium 1.9  Sinus bradycardia -Patient has had a history of sinus bradycardia and is on no AV nodal blocking agents. -Overnight patient had an episode of bradycardia with junctional beats and a sinus pause during sleep. -Cardiology consulted and appreciated and does not recommend any specific therapy at this time.  Consultants None  Procedures  Echocardiogram  Discharge Exam: Vitals:   07/28/20 1000 07/28/20 1155  BP: (!) 136/46 (!) 109/44  Pulse: 60 (!) 57  Resp: 17 17  Temp:  97.9 F (36.6 C)  SpO2: 98% 95%   Exam  General: Well developed, Chronically ill-appearing, NAD  HEENT: NCAT, mucous membranes moist.   Cardiovascular: S1 S2 auscultated, RRR  Respiratory: Clear to auscultation bilaterally, no wheezing  Abdomen: Soft, nontender, nondistended, + bowel sounds  Extremities: warm dry without cyanosis clubbing, or edema  Neuro: AAOx3, nonfocal  Skin: Lower extremities  with several dressings in place, blisters noted on dorsal aspect of feet bilaterally  Psych: pleasant, appropriate mood and affect   Discharge Instructions Discharge Instructions    [facility] pharmacist to adjust dose for Vancomycin, Aminoglycosides and other anti-infective therapies as requested by physician.   Complete by: As directed    [facility] to provide Cath Flo 2mg    Complete by: As directed    Administer for PICC line occlusion and as ordered by physician for other access device issues.   Anaphylaxis Kit: Provided to treat any anaphylactic reaction to the medication being provided to the patient if First Dose or when requested by physician   Complete by: As directed    Epinephrine 1mg /ml vial / amp: Administer 0.3mg  (0.38ml) subcutaneously once for moderate to severe anaphylaxis, nurse to call physician and pharmacy when reaction occurs and call 911 if needed for immediate care   Diphenhydramine 50mg /ml IV vial: Administer 25-50mg  IV/IM PRN for first dose reaction, rash, itching, mild reaction, nurse to call physician and pharmacy when reaction occurs   Sodium Chloride 0.9% NS 552ml IV: Administer if needed for hypovolemic blood pressure drop or as ordered by physician after call to physician with anaphylactic reaction   Change dressing on IV access line weekly and PRN   Complete by: As directed    Diet - low sodium heart healthy   Complete by: As directed    Discharge instructions   Complete by: As directed    Patient will be discharged to SNF.  Patient will need to follow up with primary care provider within one week of discharge, repeat CBC and BMP.  Patient should continue medications as prescribed.  Patient should follow a heart healthy diet.   Discharge wound care:   Complete by: As directed    Keep clean and dry   Flush IV access with Sodium Chloride 0.9% and Heparin 10 units/ml or 100 units/ml  Complete by: As directed    Home infusion  instructions - [facility]   Complete by: As directed    Instructions: Flush IV access with Sodium Chloride 0.9% and Heparin 10units/ml or 100units/ml   Change dressing on IV access line: Weekly and PRN   Instructions Cath Flo $Remove'2mg'QgAgNwP$ : Administer for PICC Line occlusion and as ordered by physician for other access device   [facility] pharmacist to adjust dose for: Vancomycin, Aminoglycosides and other anti-infective therapies as requested by physician   Increase activity slowly   Complete by: As directed    Method of administration may be changed at the discretion of home infusion pharmacist based upon assessment of the patient and/or caregiver's ability to self-administer the medication ordered   Complete by: As directed    No wound care   Complete by: As directed    Outpatient Parenteral Antibiotic Therapy Information Antibiotic: Ceftriaxone (Rocephin) IVPB, Ampicillin; Indications for use: endocarditis, discitis; End Date: 08/20/2020   Complete by: As directed    Antibiotic:  Ceftriaxone (Rocephin) IVPB Ampicillin     Indications for use: endocarditis, discitis   End Date: 08/20/2020     Allergies as of 07/28/2020      Reactions   Actos [pioglitazone] Swelling   Ankle swelling   Sulfonamide Derivatives Swelling   Adhesive [tape] Other (See Comments)   WITH PROLONGED EXPOSURE (REDNESS/ITCHING)   Codeine Nausea And Vomiting      Medication List    STOP taking these medications   ampicillin 2 g in sodium chloride 0.9 % 100 mL   cefTRIAXone 2 g in sodium chloride 0.9 % 100 mL   doxycycline 100 MG tablet Commonly known as: VIBRA-TABS   spironolactone 25 MG tablet Commonly known as: ALDACTONE     TAKE these medications   acetaminophen 500 MG tablet Commonly known as: TYLENOL Take 2 tablets (1,000 mg total) by mouth every 8 (eight) hours as needed for mild pain.   ampicillin  IVPB Inject 2 g into the vein every 6 (six) hours. Indication:  Endocarditis,  discitis First Dose: No Last Day of Therapy:  08/20/20 Labs - Once weekly:  CBC/D and BMP, Labs - Every other week:  ESR and CRP Method of administration: Ambulatory Pump (Continuous Infusion) Method of administration may be changed at the discretion of home infusion pharmacist based upon assessment of the patient and/or caregiver's ability to self-administer the medication ordered.   atorvastatin 80 MG tablet Commonly known as: LIPITOR Take 0.5 tablets (40 mg total) by mouth at bedtime.   CALTRATE 600+D PO Take 1 tablet by mouth daily with lunch.   cefTRIAXone  IVPB Commonly known as: ROCEPHIN Inject 2 g into the vein every 12 (twelve) hours. Indication:  Endocarditis/discitis First Dose: No Last Day of Therapy:  08/20/20 Labs - Once weekly:  CBC/D and BMP, Labs - Every other week:  ESR and CRP Method of administration: IV Push Method of administration may be changed at the discretion of home infusion pharmacist based upon assessment of the patient and/or caregiver's ability to self-administer the medication ordered.   Dermacloud Oint Apply 1 application topically in the morning, at noon, and at bedtime.   Eliquis 2.5 MG Tabs tablet Generic drug: apixaban TAKE 1 TABLET BY MOUTH TWICE DAILY What changed: how much to take   furosemide 20 MG tablet Commonly known as: LASIX Take 3 tablets (60 mg total) by mouth daily. What changed:   medication strength  how much to take  Another  medication with the same name was removed. Continue taking this medication, and follow the directions you see here.   gabapentin 100 MG capsule Commonly known as: NEURONTIN Take 1 capsule (100 mg total) by mouth 2 (two) times daily.   hyoscyamine 0.125 MG SL tablet Commonly known as: LEVSIN SL Place 1 tablet (0.125 mg total) under the tongue every 6 (six) hours as needed. What changed: reasons to take this   ipratropium 0.03 % nasal spray Commonly known as: ATROVENT Place 2 sprays into both  nostrils every 12 (twelve) hours.   lactase 3000 units tablet Commonly known as: LACTAID Take 3,000 Units by mouth every 6 (six) hours as needed (lactose Intolerance).   lactose free nutrition Liqd Take 237 mLs by mouth 3 (three) times daily with meals.   levothyroxine 75 MCG tablet Commonly known as: SYNTHROID Take 1 tablet (75 mcg total) by mouth daily at 6 (six) AM. What changed:   medication strength  how much to take  when to take this   Lidocaine 4 % Ptch Place 1 patch onto the skin daily as needed (pain.).   Lidocaine-Menthol Roll-On 4-1 % Liqd Generic drug: Lidocaine-Menthol Apply 1 application topically every 8 (eight) hours as needed (pain).   nitroGLYCERIN 0.4 MG SL tablet Commonly known as: NITROSTAT Place 0.4 mg under the tongue every 5 (five) minutes as needed for chest pain.   oxyCODONE 5 MG immediate release tablet Commonly known as: Oxy IR/ROXICODONE Take 1 tablet (5 mg total) by mouth every 6 (six) hours as needed for severe pain. What changed: when to take this   polyethylene glycol 17 g packet Commonly known as: MIRALAX / GLYCOLAX Take 17 g by mouth daily.   PRESERVISION AREDS 2 PO Take 1 tablet by mouth 2 (two) times daily.   saccharomyces boulardii 250 MG capsule Commonly known as: FLORASTOR Take 250 mg by mouth daily as needed (digestion).   sennosides-docusate sodium 8.6-50 MG tablet Commonly known as: SENOKOT-S Take 1 tablet by mouth daily.   sodium chloride 0.65 % Soln nasal spray Commonly known as: OCEAN Place 1 spray into both nostrils daily as needed for congestion.   sucralfate 1 GM/10ML suspension Commonly known as: Carafate TAKE 10 MLS (1 G TOTAL) BY MOUTH 2 (TWO) TIMES DAILY. What changed:   how much to take  how to take this  when to take this  additional instructions   SYSTANE FREE OP Place 1 drop into both eyes every 8 (eight) hours as needed (dry eyes).   traZODone 50 MG tablet Commonly known as:  DESYREL Take 25 mg by mouth at bedtime.            Discharge Care Instructions  (From admission, onward)         Start     Ordered   07/27/20 0000  Discharge wound care:       Comments: Keep clean and dry   07/27/20 1435   07/27/20 0000  Change dressing on IV access line weekly and PRN  (Home infusion instructions - [facility] )        07/27/20 1454         Allergies  Allergen Reactions  . Actos [Pioglitazone] Swelling    Ankle swelling  . Sulfonamide Derivatives Swelling  . Adhesive [Tape] Other (See Comments)    WITH PROLONGED EXPOSURE (REDNESS/ITCHING)  . Codeine Nausea And Vomiting    Contact information for follow-up providers    Sally Post, MD. Schedule an appointment  as soon as possible for a visit in 1 week(s).   Specialty: Family Medicine Why: Hospital follow up Contact information: [place] [address] [phone]        Sherren Mocha, MD .   Specialty: Cardiology Contact information: [address] [address] [phone]            Contact information for after-discharge care    Destination    [facility] Preferred SNF .   Service: Skilled Nursing Contact information: [place] [phone]                   The results of significant diagnostics from this hospitalization (including imaging, microbiology, ancillary and laboratory) are listed below for reference.    Significant Diagnostic Studies: DG Chest 2 View  Result Date: 07/04/2020 CLINICAL DATA:  Syncope EXAM: CHEST - 2 VIEW COMPARISON:  April 01, 2018 FINDINGS: There is an equivocal pleural effusion on each side with mild bibasilar atelectasis. No edema or airspace opacity. Heart is mildly enlarged with pulmonary vascularity normal. Patient is status Valdez coronary artery bypass grafting. No adenopathy. There are foci of degenerative  change in the thoracic spine. IMPRESSION: Mild bibasilar atelectasis with equivocal pleural effusion on each side. No airspace opacity or edema evident. Heart mildly enlarged. Status Valdez coronary artery bypass grafting. Electronically Signed   By: Lowella Grip III M.D.   On: 07/04/2020 08:57   DG Ankle Complete Left  Result Date: 07/04/2020 CLINICAL DATA:  Fall, left ankle pain EXAM: LEFT ANKLE COMPLETE - 3+ VIEW COMPARISON:  None. FINDINGS: Three view radiograph left ankle demonstrates normal alignment. No acute fracture or dislocation. Ankle mortise is intact. No ankle effusion. Small plantar calcaneal spur. Mild bimalleolar soft tissue swelling. Diffuse subcutaneous edema noted involving the visualized left foreleg and left forefoot. IMPRESSION: Soft tissue swelling.  No fracture or dislocation. Electronically Signed   By: Fidela Salisbury MD   On: 07/04/2020 06:48   CT HEAD WO CONTRAST  Result Date: 07/04/2020 CLINICAL DATA:  87 year old female with dizziness.  Fall at home. EXAM: CT HEAD WITHOUT CONTRAST TECHNIQUE: Contiguous axial images were obtained from the base of the skull through the vertex without intravenous contrast. COMPARISON:  [place]  Head CT 07/20/2019. FINDINGS: Brain: Stable cerebral volume from last year, generalized volume loss. No midline shift, ventriculomegaly, mass effect, evidence of mass lesion, intracranial hemorrhage or evidence of cortically based acute infarction. Gray-white matter differentiation is within normal limits throughout the brain. Dystrophic basal ganglia calcifications. No focal encephalomalacia identified. Vascular: Calcified atherosclerosis at the skull base. No suspicious intracranial vascular hyperdensity. Skull: Stable, intact. Sinuses/Orbits: Chronic left frontal sinus opacification with small osteoma along the posterior left frontoethmoidal recess, unchanged. Other Visualized paranasal sinuses and mastoids are clear. Other: No orbit or scalp  soft tissue injury identified. IMPRESSION: 1. No acute intracranial abnormality or acute traumatic injury identified. Stable generalized cerebral volume loss. 2. Chronic left frontal sinus disease. Electronically Signed   By: Genevie Ann M.D.   On: 07/04/2020 07:19   MR BRAIN W WO CONTRAST  Result Date: 07/07/2020 CLINICAL DATA:  Dizziness.  Fall.  Aortic valve endocarditis. EXAM: MRI HEAD WITHOUT AND WITH CONTRAST TECHNIQUE: Multiplanar, multiecho pulse sequences of the brain and surrounding structures were obtained without and with intravenous contrast. CONTRAST:  52mL GADAVIST GADOBUTROL 1 MMOL/ML IV SOLN COMPARISON:  Head CT 07/04/2020 FINDINGS: Brain: There is no evidence of an  acute infarct, mass, midline shift, or extra-axial fluid collection. There is moderately advanced cerebral atrophy which is greatest in the parietal lobes. A few scattered chronic cerebral and cerebellar microhemorrhages are noted and are nonspecific. Several small foci of T2 hyperintensity in the cerebral white matter are nonspecific and considered to be within normal limits for age. Vascular: Major intracranial vascular flow voids are preserved. Skull and upper cervical spine: 2.5 cm enhancing left frontal skull lesion without clear CT correlate. Sinuses/Orbits: Bilateral cataract extraction. Complete opacification of the left frontal sinus. Mild bilateral ethmoid air cell mucosal thickening. Clear mastoid air cells. Other: None. IMPRESSION: 1. No acute intracranial abnormality. 2. Moderately advanced cerebral atrophy. 3. 2.5 cm enhancing left frontal skull lesion, nonspecific. Metastatic disease and multiple myeloma are not excluded. Electronically Signed   By: Logan Bores M.D.   On: 07/07/2020 18:34   MR LUMBAR SPINE WO CONTRAST  Result Date: 07/04/2020 CLINICAL DATA:  Fall at home. EXAM: MRI LUMBAR SPINE WITHOUT CONTRAST TECHNIQUE: Multiplanar, multisequence MR imaging of the lumbar spine was performed. No intravenous contrast  was administered. COMPARISON:  CT abdomen pelvis 07/04/2020. MRI lumbar spine 01/26/2020 FINDINGS: Segmentation:  Normal Alignment: 5 mm retrolisthesis T12-L1 which has progressed since the prior MRI. 3 mm anterolisthesis L4-5 unchanged. Vertebrae: Progressive fracture superior endplate of L1, left greater than right. There is associated fracture of the inferior endplate of X93 right greater than left. There is bone marrow edema throughout the T12 and L1 vertebral bodies. There is endplate irregularity which has progressed from the prior MRI. Mild fluid lateral to the T12-L1 disc space on the right. Retropulsion of bone into the canal with mild spinal stenosis. No other fracture identified. Conus medullaris and cauda equina: Conus extends to the L1-2 level. Conus and cauda equina appear normal. Paraspinal and other soft tissues: Mild paravertebral edema on the right at T12-L1. No fluid collection or epidural abscess. Negative for paravertebral mass. Disc levels: T12-L1: Fracture of T12 and L1 as above with retropulsion of bone into the canal. No significant spinal stenosis. L1-2: Negative L2-3: Mild disc degeneration and mild facet degeneration. No significant spinal stenosis L3-4: Disc degeneration with diffuse endplate spurring and bilateral facet degeneration. Mild to moderate subarticular stenosis bilaterally L4-5: 3 mm anterolisthesis. Diffuse disc bulging. Central and right-sided disc protrusion unchanged. Severe facet degeneration. Moderate to severe spinal stenosis. Severe subarticular and foraminal stenosis on the right. Moderate subarticular stenosis on the left L5-S1: Mild disc and facet degeneration.  Negative for stenosis. IMPRESSION: Moderate fractures of T12 and L1 as noted on CT earlier today. This has progressed since MRI of 01/26/2020. There is diffuse bone marrow edema throughout T12 and L1. There is mild paravertebral edema in the soft tissues on the right. There is retropulsion of bone into the  canal without significant spinal stenosis. No epidural abscess however there is concern infection is present in the disc space. This could be an indolent fracture or possibly superimposed infection that has occurred since the MRI of 01/26/2020. Alternatively, it is possible this is a sterile process with progressive mechanical fracture T12-L1 Multilevel degenerative change. Moderate to severe spinal stenosis L4-5 with severe subarticular and foraminal stenosis on the right. This is unchanged from the earlier MRI. Electronically Signed   By: Franchot Gallo M.D.   On: 07/04/2020 14:49   CT ABDOMEN PELVIS W CONTRAST  Result Date: 07/04/2020 CLINICAL DATA:  syncopal episode this morning and fell in the bathroom. Now having right sided abdominal tenderness EXAM: CT ABDOMEN  AND PELVIS WITH CONTRAST TECHNIQUE: Multidetector CT imaging of the abdomen and pelvis was performed using the standard protocol following bolus administration of intravenous contrast. CONTRAST:  70mL OMNIPAQUE IOHEXOL 300 MG/ML  SOLN COMPARISON:  CT abdomen and pelvis January 25, 2019. FINDINGS: Lower chest: Bibasilar atelectasis. Thoracic aortic atherosclerosis. Small hiatal hernia. Hepatobiliary: No suspicious hepatic lesion. Gallbladder surgically absent. Similar prominence of the biliary tree, likely reservoir effect Valdez cholecystectomy. Pancreas: Mild dorsal duct distension which is stable in comparison to prior studies. No focal pancreatic lesion. Spleen: No splenic injury or perisplenic hematoma. Adrenals/Urinary Tract: Bilateral adrenal glands are unremarkable. No hydronephrosis. Mild urothelial thickening at left-sided extrarenal pelvis. Nonobstructive 4 mm left lower pole renal stone. Bilateral low-density renal lesions are unchanged in comparison to prior and likely cysts. Urinary bladder is moderately distended without suspicious wall thickening. Stomach/Bowel: Stomach is predominantly decompressed limiting evaluation. No suspicious  small bowel wall thickening or dilation. The appendix is not definitely visualized however there is no inflammatory stranding along the cecum to suggest acute inflammation. Moderate volume of formed stool throughout the colon. Sigmoid colonic diverticulosis without findings of acute diverticulitis. Vascular/Lymphatic: Aortic atherosclerosis. No enlarged abdominal or pelvic lymph nodes. Reproductive: Status Valdez hysterectomy. No adnexal masses. Other: No abdominopelvic ascites. Musculoskeletal: New anterior compression deformity of the L1 vertebral body with approximately 50% height loss and mild retropulsion of bone and disc into the spinal canal with narrowing of the canal. Global demineralization of bone. Multilevel degenerative changes spine. Degenerative changes bilateral hips. IMPRESSION: 1. New anterior compression deformity of the L1 vertebral body with approximately 50% height loss and mild retropulsion of bone and disc into the spinal canal with moderate narrowing of the canal at this level. 2. Apparent mild urothelial thickening of a left-sided extrarenal pelvis may reflect ascending urinary tract infection. Correlate with urinalysis. 3. Nonobstructive left nephrolithiasis. 4. Sigmoid colonic diverticulosis without findings of acute diverticulitis. 5. Moderate volume of formed stool throughout the colon. 6. Aortic atherosclerosis.  Aortic Atherosclerosis (ICD10-I70.0). Electronically Signed   By: Dahlia Bailiff MD   On: 07/04/2020 10:04   DG Chest Port 1 View  Result Date: 07/22/2020 CLINICAL DATA:  Shortness of breath EXAM: PORTABLE CHEST 1 VIEW COMPARISON:  Chest radiograph July 04, 2020 and chest CT Sep 23, 2005 FINDINGS: Cardiomegaly, similar prior. Small to moderate bilateral pleural effusions with bibasilar airspace opacities. No visible pneumothorax. Changes of prior median sternotomy and CABG. The visualized skeletal structures are unchanged. IMPRESSION: 1. Cardiomegaly with small to moderate  bilateral pleural effusions and bibasilar airspace opacities, likely pulmonary edema. Electronically Signed   By: Dahlia Bailiff MD   On: 07/22/2020 19:24   ECHOCARDIOGRAM COMPLETE  Result Date: 07/23/2020    ECHOCARDIOGRAM REPORT   Patient Name:   [name] Date of Exam: 07/23/2020 Medical Rec #:  [id number]        Height:       64.0 in Accession #:    [id number]       Weight:       95.7 lb Date of Birth:  04-21-1933         BSA:          1.430 m Patient Age:    87 years         BP:           119/42 mmHg Patient Gender: F                HR:  58 bpm. Exam Location:  Inpatient Procedure: 2D Echo, Cardiac Doppler and Color Doppler Indications:    Congestive Heart Failure I50.9  History:        Patient has prior history of Echocardiogram examinations, most                 recent 07/07/2020. CAD; Risk Factors:Hypertension and                 Dyslipidemia.  Sonographer:    Bernadene Person RDCS Referring Phys: [place]  1. Left ventricular ejection fraction, by estimation, is 50%. The left ventricle has mildly decreased function. The left ventricle demonstrates regional wall motion abnormalities, septal-lateral dyssychrony consistent with LBBB and mild septal hypokinesis. Left ventricular diastolic parameters are consistent with Grade II diastolic dysfunction (pseudonormalization).  2. Right ventricular systolic function is mildly reduced. The right ventricular size is normal. There is mildly elevated pulmonary artery systolic pressure.  3. There is a small (0.5 x 0.9 cm) mobile vegetation noted on the ventricular size of the aortic valve. The aortic valve is tricuspid. Aortic valve regurgitation is moderate to severe and eccentric, possible holodiastolic flow reversal in descending thoracic aorta (signal not great). No aortic stenosis is present.  4. The mitral valve is abnormal. Severe mitral valve regurgitation, eccentric anterior jet likely due to P2 prolapse vs partial  flail. No evidence of mitral stenosis.  5. Left atrial size was mildly dilated.  6. The inferior vena cava is normal in size with greater than 50% respiratory variability, suggesting right atrial pressure of 3 mmHg.  7. Pleural effusion noted on left.  8. Impression: Aortic valve endocarditis, mod-severe aortic insufficiency, severe MR. FINDINGS  Left Ventricle: Left ventricular ejection fraction, by estimation, is 50%. The left ventricle has mildly decreased function. The left ventricle demonstrates regional wall motion abnormalities. The left ventricular internal cavity size was normal in size. There is no left ventricular hypertrophy. Left ventricular diastolic parameters are consistent with Grade II diastolic dysfunction (pseudonormalization). Right Ventricle: The right ventricular size is normal. No increase in right ventricular wall thickness. Right ventricular systolic function is mildly reduced. There is mildly elevated pulmonary artery systolic pressure. The tricuspid regurgitant velocity  is 2.98 m/s, and with an assumed right atrial pressure of 3 mmHg, the estimated right ventricular systolic pressure is 63.1 mmHg. Left Atrium: Left atrial size was mildly dilated. Right Atrium: Right atrial size was normal in size. Pericardium: Pleural effusion noted on left. There is no evidence of pericardial effusion. Mitral Valve: The mitral valve is abnormal. Severe mitral valve regurgitation. No evidence of mitral valve stenosis. MV peak gradient, 7.4 mmHg. The mean mitral valve gradient is 3.0 mmHg. Tricuspid Valve: The tricuspid valve is normal in structure. Tricuspid valve regurgitation is trivial. Aortic Valve: There is a small (0.5 x 0.9 cm) mobile vegetation noted on the ventricular size of the aortic valve. The aortic valve is tricuspid. Aortic valve regurgitation is moderate to severe. Aortic regurgitation PHT measures 490 msec. No aortic stenosis is present. Pulmonic Valve: The pulmonic valve was normal in  structure. Pulmonic valve regurgitation is not visualized. Aorta: The aortic root is normal in size and structure. Venous: The inferior vena cava is normal in size with greater than 50% respiratory variability, suggesting right atrial pressure of 3 mmHg. IAS/Shunts: No atrial level shunt detected by color flow Doppler.  LEFT VENTRICLE PLAX 2D LVIDd:         3.30 cm     Diastology LVIDs:  2.60 cm     LV e' medial:    3.49 cm/s LV PW:         0.90 cm     LV E/e' medial:  40.7 LV IVS:        1.00 cm     LV e' lateral:   4.50 cm/s LVOT diam:     1.60 cm     LV E/e' lateral: 31.6 LV SV:         67 LV SV Index:   47 LVOT Area:     2.01 cm  LV Volumes (MOD) LV vol d, MOD A2C: 82.3 ml LV vol d, MOD A4C: 79.8 ml LV vol s, MOD A2C: 41.9 ml LV vol s, MOD A4C: 39.5 ml LV SV MOD A2C:     40.4 ml LV SV MOD A4C:     79.8 ml LV SV MOD BP:      41.2 ml RIGHT VENTRICLE RV S prime:     8.93 cm/s TAPSE (M-mode): 1.2 cm LEFT ATRIUM             Index       RIGHT ATRIUM           Index LA diam:        2.90 cm 2.03 cm/m  RA Area:     13.10 cm LA Vol (A2C):   41.4 ml 28.95 ml/m RA Volume:   27.40 ml  19.16 ml/m LA Vol (A4C):   43.8 ml 30.63 ml/m LA Biplane Vol: 45.6 ml 31.89 ml/m  AORTIC VALVE LVOT Vmax:   142.00 cm/s LVOT Vmean:  98.400 cm/s LVOT VTI:    0.334 m AI PHT:      490 msec  AORTA Ao Root diam: 2.50 cm Ao Asc diam:  2.70 cm MITRAL VALVE                TRICUSPID VALVE MV Area (PHT): 3.21 cm     TR Peak grad:   35.5 mmHg MV Area VTI:   1.63 cm     TR Vmax:        298.00 cm/s MV Peak grad:  7.4 mmHg MV Mean grad:  3.0 mmHg     SHUNTS MV Vmax:       1.36 m/s     Systemic VTI:  0.33 m MV Vmean:      85.8 cm/s    Systemic Diam: 1.60 cm MV Decel Time: 236 msec MR PISA:        1.01 cm MR PISA Radius: 0.40 cm MV E velocity: 142.00 cm/s MV A velocity: 94.70 cm/s MV E/A ratio:  1.50 Loralie Champagne MD Electronically signed by Loralie Champagne MD Signature Date/Time: 07/23/2020/11:32:24 AM    Final    ECHOCARDIOGRAM  COMPLETE  Result Date: 07/07/2020    ECHOCARDIOGRAM REPORT   Patient Name:   [name] Date of Exam: 07/06/2020 Medical Rec #:  [id number]        Height:       64.0 in Accession #:    [id number]       Weight:       100.5 lb Date of Birth:  [birth year]         BSA:          1.460 m Patient Age:    38 years         BP:           127/58 mmHg Patient Gender: F  HR:           95 bpm. Exam Location:  Inpatient Procedure: 2D Echo, Cardiac Doppler and Color Doppler            REPORT CONTAINS CRITICAL RESULT  Results communicated to Dr Alanda Slim at 18:56 on 07/06/20. Indications:     R55 Syncope  History:         Patient has prior history of Echocardiogram examinations, most                  recent 04/03/2020. Prior CABG; Mitral Valve Disease.  Sonographer:     Roosvelt Maser RDCS Referring Phys:  [id number] Rich Reining DAM Diagnosing Phys: Epifanio Lesches MD IMPRESSIONS  1. Left ventricular ejection fraction, by estimation, is 40 to 45%. The left ventricle has mildly decreased function. The left ventricle demonstrates regional wall motion abnormalities. Abnormal (paradoxical) septal motion, consistent with left bundle branch block There is mild left ventricular hypertrophy. Left ventricular diastolic parameters are indeterminate.  2. Right ventricular systolic function is mildly reduced. The right ventricular size is normal. There is moderately elevated pulmonary artery systolic pressure. The estimated right ventricular systolic pressure is 46.6 mmHg.  3. Left atrial size was moderately dilated.  4. Right atrial size was mildly dilated.  5. The mitral valve is abnormal. Moderate to severe mitral valve regurgitation.  6. There is an aortic valve vegetation measuring 1.5cm x 0.5cm. The aortic valve is tricuspid. No aortic stenosis is present. Increased gradients through AV likely due to AI, visually does not appear stenotic. Aortic valve regurgitation is moderate to severe. FINDINGS  Left Ventricle: Left  ventricular ejection fraction, by estimation, is 40 to 45%. The left ventricle has mildly decreased function. The left ventricle demonstrates regional wall motion abnormalities. The left ventricular internal cavity size was normal in size. There is mild left ventricular hypertrophy. Abnormal (paradoxical) septal motion, consistent with left bundle branch block. Left ventricular diastolic parameters are indeterminate. Right Ventricle: The right ventricular size is normal. Right vetricular wall thickness was not well visualized. Right ventricular systolic function is mildly reduced. There is moderately elevated pulmonary artery systolic pressure. The tricuspid regurgitant velocity is 3.30 m/s, and with an assumed right atrial pressure of 3 mmHg, the estimated right ventricular systolic pressure is 46.6 mmHg. Left Atrium: Left atrial size was moderately dilated. Right Atrium: Right atrial size was mildly dilated. Pericardium: Trivial pericardial effusion is present. Mitral Valve: The mitral valve is abnormal. Moderate to severe mitral valve regurgitation. Tricuspid Valve: The tricuspid valve is normal in structure. Tricuspid valve regurgitation is mild. Aortic Valve: Aortic valve vegetation measuring 1.5cm x 0.5 cm. The aortic valve is tricuspid. Aortic valve regurgitation is severe. Aortic regurgitation PHT measures 475 msec. No aortic stenosis is present. Aortic valve mean gradient measures 9.0 mmHg. Aortic valve peak gradient measures 17.0 mmHg. Aortic valve area, by VTI measures 0.84 cm. Pulmonic Valve: The pulmonic valve was grossly normal. Pulmonic valve regurgitation is not visualized. Aorta: The aortic root is normal in size and structure. IAS/Shunts: The interatrial septum was not well visualized.  LEFT VENTRICLE PLAX 2D LVIDd:         3.80 cm  Diastology LVIDs:         2.70 cm  LV e' medial:  6.64 cm/s LV PW:         1.00 cm  LV e' lateral: 10.30 cm/s LV IVS:        1.00 cm LVOT diam:     1.70 cm  LV SV:          32 LV SV Index:   22 LVOT Area:     2.27 cm  RIGHT VENTRICLE RV Basal diam:  3.40 cm LEFT ATRIUM             Index       RIGHT ATRIUM           Index LA diam:        3.10 cm 2.12 cm/m  RA Area:     16.90 cm LA Vol (A2C):   56.0 ml 38.34 ml/m RA Volume:   45.30 ml  31.02 ml/m LA Vol (A4C):   59.5 ml 40.74 ml/m LA Biplane Vol: 63.1 ml 43.21 ml/m  AORTIC VALVE AV Area (Vmax):    1.13 cm AV Area (Vmean):   0.95 cm AV Area (VTI):     0.84 cm AV Vmax:           206.00 cm/s AV Vmean:          140.000 cm/s AV VTI:            0.380 m AV Peak Grad:      17.0 mmHg AV Mean Grad:      9.0 mmHg LVOT Vmax:         103.00 cm/s LVOT Vmean:        58.500 cm/s LVOT VTI:          0.141 m LVOT/AV VTI ratio: 0.37 AI PHT:            475 msec  AORTA Ao Root diam: 2.70 cm Ao Asc diam:  2.40 cm MR Peak grad:    86.5 mmHg   TRICUSPID VALVE MR Mean grad:    46.0 mmHg   TR Peak grad:   43.6 mmHg MR Vmax:         465.00 cm/s TR Vmax:        330.00 cm/s MR Vmean:        307.0 cm/s MR PISA:         2.26 cm    SHUNTS MR PISA Eff ROA: 16 mm      Systemic VTI:  0.14 m MR PISA Radius:  0.60 cm     Systemic Diam: 1.70 cm Oswaldo Milian MD Electronically signed by Oswaldo Milian MD Signature Date/Time: 07/06/2020/7:15:52 PM    Final (Updated)    VAS [place] LOWER EXTREMITY VENOUS (DVT)  Result Date: 07/24/2020  Lower Venous DVT Study Indications: Edema, SOB.  Comparison Study: No prior studies. Performing Technologist: Darlin Coco RDMS,RVT  Examination Guidelines: A complete evaluation includes B-mode imaging, spectral Doppler, color Doppler, and power Doppler as needed of all accessible portions of each vessel. Bilateral testing is considered an integral part of a complete examination. Limited examinations for reoccurring indications may be performed as noted. The reflux portion of the exam is performed with the patient in reverse Trendelenburg.  +---------+---------------+---------+-----------+----------+--------------+  RIGHT    CompressibilityPhasicitySpontaneityPropertiesThrombus Aging +---------+---------------+---------+-----------+----------+--------------+ CFV      Partial        Yes      Yes                  Chronic        +---------+---------------+---------+-----------+----------+--------------+ SFJ      Full                                                        +---------+---------------+---------+-----------+----------+--------------+  FV Prox  Full                                                        +---------+---------------+---------+-----------+----------+--------------+ FV Mid   Full                                                        +---------+---------------+---------+-----------+----------+--------------+ FV DistalFull                                                        +---------+---------------+---------+-----------+----------+--------------+ PFV      Full                                                        +---------+---------------+---------+-----------+----------+--------------+ POP      Full           Yes      Yes                                 +---------+---------------+---------+-----------+----------+--------------+ PTV      Full                                                        +---------+---------------+---------+-----------+----------+--------------+ PERO     Full                                                        +---------+---------------+---------+-----------+----------+--------------+   +---------+---------------+---------+-----------+----------+--------------+ LEFT     CompressibilityPhasicitySpontaneityPropertiesThrombus Aging +---------+---------------+---------+-----------+----------+--------------+ CFV      Full           Yes      Yes                                 +---------+---------------+---------+-----------+----------+--------------+ SFJ      Full                                                         +---------+---------------+---------+-----------+----------+--------------+ FV Prox  Full                                                        +---------+---------------+---------+-----------+----------+--------------+  FV Mid   Full                                                        +---------+---------------+---------+-----------+----------+--------------+ FV DistalFull                                                        +---------+---------------+---------+-----------+----------+--------------+ PFV      Full                                                        +---------+---------------+---------+-----------+----------+--------------+ POP      Full           Yes      Yes                                 +---------+---------------+---------+-----------+----------+--------------+ PTV      Full                                                        +---------+---------------+---------+-----------+----------+--------------+ PERO     Full                                                        +---------+---------------+---------+-----------+----------+--------------+     Summary: RIGHT: - Findings consistent with chronic, non-occlusive 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 - No cystic structure found in the popliteal fossa.  LEFT: - There is no evidence of deep vein thrombosis in the lower extremity.  - No cystic structure found in the popliteal fossa.  *See table(s) above for measurements and observations. Electronically signed by Ruta Hinds MD on 07/24/2020 at 3:09:25 PM.    Final    [place] EKG SITE RITE  Result Date: 07/12/2020 If Site Rite image not attached, placement could not be confirmed due to current cardiac rhythm.  CT MAXILLOFACIAL WO CONTRAST  Result Date: 07/07/2020 CLINICAL DATA:  Possible odontogenic source of bacteremia and discitis. EXAM: CT MAXILLOFACIAL WITHOUT CONTRAST  TECHNIQUE: Multidetector CT imaging of the maxillofacial structures was performed. Multiplanar CT image reconstructions were also generated. COMPARISON:  Head MRI 07/07/2020 and CT 07/04/2020 FINDINGS: Osseous: No fracture or destructive osseous process. Multiple prior dental extractions. No definite dental caries or periapical abscess. Orbits: Bilateral cataract extraction.  No acute finding. Sinuses: Chronic left frontal sinusitis with complete sinus opacification, also present in 06/2019. Minor bilateral ethmoid air cell mucosal thickening. Clear mastoid air cells. Soft tissues: Moderate right and mild left carotid bifurcation calcific atherosclerosis. Limited intracranial: More fully evaluated on today's head MRI. IMPRESSION: 1. No evidence of acute maxillofacial abnormality. No  definite dental caries or periapical abscess. 2. Chronic left frontal sinusitis. Electronically Signed   By: Sebastian Ache M.D.   On: 07/07/2020 18:46    Microbiology: Recent Results (from the past 240 hour(s))  Resp Panel by RT-PCR (Flu A&B, Covid) Nasopharyngeal Swab     Status: None   Collection Time: 07/22/20  7:24 PM   Specimen: Nasopharyngeal Swab; Nasopharyngeal(NP) swabs in vial transport medium  Result Value Ref Range Status   SARS Coronavirus 2 by RT PCR NEGATIVE NEGATIVE Final    Comment: (NOTE) SARS-CoV-2 target nucleic acids are NOT DETECTED.  The SARS-CoV-2 RNA is generally detectable in upper respiratory specimens during the acute phase of infection. The lowest concentration of SARS-CoV-2 viral copies this assay can detect is 138 copies/mL. A negative result does not preclude SARS-Cov-2 infection and should not be used as the sole basis for treatment or other patient management decisions. A negative result may occur with  improper specimen collection/handling, submission of specimen other than nasopharyngeal swab, presence of viral mutation(s) within the areas targeted by this assay, and inadequate  number of viral copies(<138 copies/mL). A negative result must be combined with clinical observations, patient history, and epidemiological information. The expected result is Negative.  Fact Sheet for Patients:  BloggerCourse.com  Fact Sheet for Healthcare Providers:  SeriousBroker.it  This test is no t yet approved or cleared by the [place] FDA and  has been authorized for detection and/or diagnosis of SARS-CoV-2 by FDA under an Emergency Use Authorization (EUA). This EUA will remain  in effect (meaning this test can be used) for the duration of the COVID-19 declaration under Section 564(b)(1) of the Act, 21 U.S.C.section 360bbb-3(b)(1), unless the authorization is terminated  or revoked sooner.       Influenza A by PCR NEGATIVE NEGATIVE Final   Influenza B by PCR NEGATIVE NEGATIVE Final    Comment: (NOTE) The Xpert Xpress SARS-CoV-2/FLU/RSV plus assay is intended as an aid in the diagnosis of influenza from Nasopharyngeal swab specimens and should not be used as a sole basis for treatment. Nasal washings and aspirates are unacceptable for Xpert Xpress SARS-CoV-2/FLU/RSV testing.  Fact Sheet for Patients: BloggerCourse.com  Fact Sheet for Healthcare Providers: SeriousBroker.it  This test is not yet approved or cleared by the [place] FDA and has been authorized for detection and/or diagnosis of SARS-CoV-2 by FDA under an Emergency Use Authorization (EUA). This EUA will remain in effect (meaning this test can be used) for the duration of the COVID-19 declaration under Section 564(b)(1) of the Act, 21 U.S.C. section 360bbb-3(b)(1), unless the authorization is terminated or revoked.  Performed at [facility], [address] [place], [place] [address]   MRSA PCR Screening     Status: None   Collection Time: 07/22/20 10:56 PM   Specimen: Nasal Mucosa;  Nasopharyngeal  Result Value Ref Range Status   MRSA by PCR NEGATIVE NEGATIVE Final    Comment:        The GeneXpert MRSA Assay (FDA approved for NASAL specimens only), is one component of a comprehensive MRSA colonization surveillance program. It is not intended to diagnose MRSA infection nor to guide or monitor treatment for MRSA infections. Performed at [facility], [address], [place], [place] [address]   SARS CORONAVIRUS 2 (TAT 6-24 HRS) Nasopharyngeal Nasopharyngeal Swab     Status: None   Collection Time: 07/27/20  6:24 AM   Specimen: Nasopharyngeal Swab  Result Value Ref Range Status   SARS  Coronavirus 2 NEGATIVE NEGATIVE Final    Comment: (NOTE) SARS-CoV-2 target nucleic acids are NOT DETECTED.  The SARS-CoV-2 RNA is generally detectable in upper and lower respiratory specimens during the acute phase of infection. Negative results do not preclude SARS-CoV-2 infection, do not rule out co-infections with other pathogens, and should not be used as the sole basis for treatment or other patient management decisions. Negative results must be combined with clinical observations, patient history, and epidemiological information. The expected result is Negative.  Fact Sheet for Patients: SugarRoll.be  Fact Sheet for Healthcare Providers: https://www.woods-mathews.com/  This test is not yet approved or cleared by the [place] FDA and  has been authorized for detection and/or diagnosis of SARS-CoV-2 by FDA under an Emergency Use Authorization (EUA). This EUA will remain  in effect (meaning this test can be used) for the duration of the COVID-19 declaration under Se ction 564(b)(1) of the Act, 21 U.S.C. section 360bbb-3(b)(1), unless the authorization is terminated or revoked sooner.  Performed at [facility], [place] [address], [place], [place] [address]      Labs: Basic Metabolic Panel: Recent Labs  Lab  07/23/20 [phone] 07/24/20 0455 07/25/20 0545 07/25/20 1302 07/26/20 0500 07/26/20 0757 07/27/20 0340 07/28/20 0350  NA 138 139 139  --  136  --  138 133*  K 3.7 3.8 3.3*  --  3.3*  --  4.3 3.8  CL 103 103 100  --  98  --  100 97*  CO2 30 32 33*  --  35*  --  34* 31  GLUCOSE 84 73 75  --  86  --  79 72  BUN $Re'11 10 9  'niC$ --  10  --  9 10  CREATININE 0.82 0.85 0.84  --  0.90  --  0.89 0.89  CALCIUM 7.5* 7.1* 7.6*  --  7.5*  --  7.9* 7.7*  MG 1.8 1.7  --  1.7  --  1.9  --   --   PHOS 2.7 2.5  --   --   --   --   --   --    Liver Function Tests: Recent Labs  Lab 07/22/20 1927 07/23/20 0812 07/24/20 0455  AST 36  --   --   ALT 32  --   --   ALKPHOS 78  --   --   BILITOT 0.5  --   --   PROT 5.5*  --   --   ALBUMIN 2.0* 1.9* 1.8*   No results for input(s): LIPASE, AMYLASE in the last 168 hours. No results for input(s): AMMONIA in the last 168 hours. CBC: Recent Labs  Lab 07/22/20 1927 07/23/20 0812 07/24/20 0455 07/25/20 0545 07/26/20 0500 07/27/20 0340  WBC 10.8* 8.8 8.1 7.3 6.7  --   NEUTROABS 9.4*  --   --   --   --   --   HGB 10.0* 9.1* 8.8* 8.8* 8.0* 8.3*  HCT 31.7* 27.7* 26.8* 27.5* 25.1* 25.4*  MCV 87.3 85.0 84.8 85.4 85.1  --   PLT 219 201 211 195 184  --    Cardiac Enzymes: No results for input(s): CKTOTAL, CKMB, CKMBINDEX, TROPONINI in the last 168 hours. BNP: BNP (last 3 results) Recent Labs    07/22/20 1927  BNP 640.7*    ProBNP (last 3 results) No results for input(s): PROBNP in the last 8760 hours.  CBG: Recent Labs  Lab 07/23/20 2142  GLUCAP 92  Signed:  Cristal Ford  [facility] 07/28/2020, 3:11 PM

## 2020-07-28 NOTE — Progress Notes (Signed)
Spoke with Dr. Caryl Comes, no further cardiac workup anticipated, she is ok to be discharged from cardiac perspective

## 2020-07-28 NOTE — Progress Notes (Signed)
Physical Therapy Treatment Patient Details Name: [name] MRN: [id number] DOB: 1932-10-10 Today's Date: 07/28/2020    History of Present Illness 87 yo admitted 3/27 with CHF. Recent admission 3/9-3/18 after fall with L9fx with TLSO and mitral valve vegetation. PMhx: chronic back pain, CAD, CABG, AFib, HLD, aortic and mitral regurgitation    PT Comments    Pt pleasant and willing to get OOB but reports concern that brace will cause pain. Pt with total assist to don brace in sitting and pt reports no pain with brace today. HR 50-72 with activity this session. Pt able to progress to walking 50' and reports she was walking long halls at [facility] prior to this admission. Will continue to follow and D/c plan appropriate.     Follow Up Recommendations  Supervision/Assistance - 24 hour;SNF     Equipment Recommendations  None recommended by PT    Recommendations for Other Services       Precautions / Restrictions Precautions Precautions: Fall;Back Precaution Comments: spinal brace for OOB donned in sitting, blisters bil feet Required Braces or Orthoses: Spinal Brace Spinal Brace: Thoracolumbosacral orthotic    Mobility  Bed Mobility Overal bed mobility: Needs Assistance Bed Mobility: Rolling;Sidelying to Sit Rolling: Min guard Sidelying to sit: Min guard       General bed mobility comments: increased time with use of rail and cues for sequence    Transfers Overall transfer level: Needs assistance   Transfers: Sit to/from Stand Sit to Stand: Min assist         General transfer comment: cues for hand placement with assist for anterior translation and rise from surface  Ambulation/Gait Ambulation/Gait assistance: Min assist Gait Distance (Feet): 50 Feet Assistive device: Rolling walker (2 wheeled) Gait Pattern/deviations: Step-through pattern;Decreased stride length   Gait velocity interpretation: <1.8 ft/sec, indicate of risk for recurrent falls General Gait Details:  cues for posture and safety with slow cautious gait and assist for lines, increased time for turning. limited by fatigue. HR 70 with gait   Stairs             Wheelchair Mobility    Modified Rankin (Stroke Patients Only)       Balance Overall balance assessment: Needs assistance Sitting-balance support: No upper extremity supported;Feet supported Sitting balance-Leahy Scale: Poor Sitting balance - Comments: pt with posterior LOB with brace donning and requires tactile cues/assist to correct   Standing balance support: Bilateral upper extremity supported Standing balance-Leahy Scale: Poor Standing balance comment: bil UE support on RW, posterior bias even with bil UE support                            Cognition Arousal/Alertness: Awake/alert Behavior During Therapy: WFL for tasks assessed/performed Overall Cognitive Status: Impaired/Different from baseline Area of Impairment: Memory                   Current Attention Level: Selective Memory: Decreased short-term memory;Decreased recall of precautions       Problem Solving: Slow processing        Exercises      General Comments        Pertinent Vitals/Pain Pain Score: 3  Pain Location: feet- blisters    Home Living                      Prior Function            PT Goals (current goals can  now be found in the care plan section) Progress towards PT goals: Progressing toward goals    Frequency    Min 2X/week      PT Plan Current plan remains appropriate;Frequency needs to be updated    Co-evaluation              AM-PAC PT "6 Clicks" Mobility   Outcome Measure  Help needed turning from your back to your side while in a flat bed without using bedrails?: A Little Help needed moving from lying on your back to sitting on the side of a flat bed without using bedrails?: A Little Help needed moving to and from a bed to a chair (including a wheelchair)?: A  Little Help needed standing up from a chair using your arms (e.g., wheelchair or bedside chair)?: A Little Help needed to walk in hospital room?: A Little Help needed climbing 3-5 steps with a railing? : A Lot 6 Click Score: 17    End of Session Equipment Utilized During Treatment: Back brace Activity Tolerance: Patient tolerated treatment well Patient left: in chair;with call bell/phone within reach;with family/visitor present Nurse Communication: Mobility status PT Visit Diagnosis: Unsteadiness on feet (R26.81);Other abnormalities of gait and mobility (R26.89);Pain;Difficulty in walking, not elsewhere classified (R26.2)     Time: 6195-0932 PT Time Calculation (min) (ACUTE ONLY): 26 min  Charges:  $Gait Training: 8-22 mins $Therapeutic Activity: 8-22 mins                     [name] P, PT Acute Rehabilitation Services Pager: [phone] Office: [place] 07/28/2020, 1:36 PM

## 2020-07-28 NOTE — Progress Notes (Signed)
Gave a report to Geologist, engineering at [employer]. Patient has PICC line on Rt. UA single lumen and ampicillin IVPB 6 PM dose infusion was done. SW informed pt's husband regarding patient is going to [facility] today. Called PTAR and informed patient should go to [facility] before 9 PM. She couldn't go there last night due to PTAR was late. PTAR came around 9. Took patient & her belonging (included back brace). HS [employer]

## 2020-07-28 NOTE — Progress Notes (Signed)
Cardiologist checked on patient and there is no intervention at this time. Dr. Ree Kida notified this matter and Dr. Ree Kida ordered d/c SNF today. Paging Dr. Caryl Comes regarding d/c SNF is okay for cardiac view point. No answered, paging PA [facility] regarding this matter. Called back from [facility], he is going to ask Dr. Caryl Comes then call back for letting this nurse know. HS [employer]

## 2020-07-28 NOTE — TOC Transition Note (Signed)
Transition of Care [facility]) - CM/SW Discharge Note   Patient Details  Name: [name] MRN: [id number] Date of Birth: 10-Sep-1932  Transition of Care [facility] CM/SW Contact:  Bary Castilla, LCSW Phone Number: [phone] 07/28/2020, 3:33 PM   Clinical Narrative:    Patient will DC to:?[facility] Anticipated DC date:07/28/2020? Family notified:?Husband Transport XO:VANV   Per MD patient ready for DC to [facility] RN, patient, patient's family, and facility notified of DC. Discharge Summary sent to facility. RN given number for reportRN call report to [phone] ask for [facility]. DC packet on chart. Ambulance transport requested for patient.   CSW signing off.   [place], [place] [phone]    Final next level of care: Skilled Nursing Facility Barriers to Discharge: Barriers Resolved   Patient Goals and CMS Choice Patient states their goals for this hospitalization and ongoing recovery are:: Rehab CMS Medicare.gov Compare Post Acute Care list provided to:: Patient Choice offered to / list presented to : Patient  Discharge Placement              Patient chooses bed at: [facility], [place] Patient to be transferred to facility by: [place] Name of family member notified: husband [name] in room. Patient and family notified of of transfer: 07/27/20  Discharge Plan and Services In-house Referral: Clinical Social Work   Post Acute Care Choice: [place]                               Social Determinants of Health (SDOH) Interventions     Readmission Risk Interventions No flowsheet data found.

## 2020-07-28 NOTE — Progress Notes (Signed)
Patient's HR dropped to 20's two times. Patient sleeping but woke up on arousal. Patient asymptomatic. Vitals taken. MD made aware/ No new orders received. Will continue to monitor the patient.

## 2020-07-28 NOTE — Consult Note (Addendum)
Cardiology Consultation:   Patient ID: [name] MRN: [id number]; DOB: [birth year]  Admit date: 07/22/2020 Date of Consult: 07/28/2020  PCP:  [name], MD   [place]  Cardiologist:  Sherren Mocha, MD  Advanced Practice Provider:  No care team member to display Electrophysiologist:  None    Patient Profile:   [name] is a 87 y.o. female with a hx of onic anemia, severe MR, chronic systolic heart failure, PAF, hypertension, hyperlipidemia and hypothyroidism who is being seen today for the evaluation of bradycardia at the request of Dr. Ree Kida.  History of Present Illness:   [name] is a pleasant 87 year old female with past medical history of CAD s/p CABG 2007, chronic anemia, severe MR, chronic systolic heart failure, PAF, hypertension, hyperlipidemia and hypothyroidism.  TTE obtained on 08/19/2019 showed EF 45 to 50%, moderate MR.  Repeat TTE obtained on 02/16/2020 showed EF 45 to 50%, probably severe MR with mild AI.  Subsequent TEE on 04/03/2020 showed severe MR with moderate to severe AI. She has failed cardioversion in December 2021.  Afterward, she was started on amiodarone and had successful repeat cardioversion in January 2022.  Most recently, she was seen by Dr. Burt Knack on 06/20/2020 due to elevated LFT.  She was also complaining of poor appetite, amiodarone was reduced to 100 mg daily.  She was subsequently admitted in March 2022 with discitis, bacteremia and endocarditis.  TTE obtained on 07/06/2020 showed EF 40 to 45%, RVSP 46.6 mmHg, moderate LAE, moderate to severe MR, 1.5 cm x 0.5 cm aortic valve vegetation with moderate to severe AI.  She was felt not to be a surgical candidate by Dr. Cyndia Bent and was eventually discharged on IV antibiotic therapy.  She was made DNR/DNI as well.  Brain MRI was negative without any evidence of stroke.  During the hospitalization, she also had recurrence of atrial fibrillation and was placed on  Eliquis.  Amiodarone was held in the setting of acute liver injury.  She was initially placed on 12.5 mg twice daily of metoprolol, this was discontinued near the discharge date due to bradycardia.  She was eventually discharged to [facility] facility for rehab.    Patient returned back to the hospital on 07/22/2020 due to worsening dyspnea.  Initial chest x-ray showed cardiomegaly with small to moderate bilateral pleural effusions and bilateral airspace opacity.  Patient was treated for acute on chronic combined systolic and diastolic heart failure with IV diuretic.  Repeat echocardiogram obtained on 07/15/2020 showed EF 50%, grade 2 DD, small 0.5 x 0.9 cm mobile vegetation on the aortic valve with moderate to severe AI, severe MR.  She underwent IV diuresis with removal of 4 L of fluid.  She was eventually placed on 60 mg daily of Lasix.  In the morning of 07/28/2020, she had episodes of severe bradycardia secondary to sinus pauses and junctional rhythm, heart rate went down to the 20s.  Patient was asleep at the time and appears to be asymptomatic.  She was able to awake with arousal and denies any symptom associated with the events.  Cardiology has been consulted for bradycardia.     Past Medical History:  Diagnosis Date  . Allergy   . Anemia, pernicious   . Arthritis    bursitis   . Atrophic gastritis   . B12 deficiency   . Bacteremia due to Enterococcus 07/09/2020  . CAD (coronary artery disease)   . Carotid artery stenosis   . Cataract   .  Diarrhea    on and off for months  . Family history of adverse reaction to anesthesia    extreme nausea brother and sister  . Fibromyalgia   . Gallbladder polyp   . GERD (gastroesophageal reflux disease)   . Heart murmur   . History of hiatal hernia   . History of kidney stones   . HLD (hyperlipidemia)   . HTN (hypertension)   . Hypothyroidism   . Irritable bowel syndrome (IBS)   . Median arcuate ligament syndrome ([place])   . Osteoporosis    . PVD (peripheral vascular disease) ([place])   . Tubular adenoma of colon 08/2013   with focal high grade dysplasia    Past Surgical History:  Procedure Laterality Date  . ABDOMINAL HYSTERECTOMY    . APPENDECTOMY    . BIOPSY THYROID    . CARDIOVERSION N/A 04/03/2020   Procedure: CARDIOVERSION;  Surgeon: Dorothy Spark, MD;  Location: [facility] ENDOSCOPY;  Service: Cardiovascular;  Laterality: N/A;  . cataract surgery    . CHOLECYSTECTOMY N/A 10/24/2014   Procedure: MICROSCOPIC  CHOLECYSTECTOMY;  Surgeon: Ralene Ok, MD;  Location: [facility];  Service: General;  Laterality: N/A;  . COLONOSCOPY    . CORONARY ARTERY BYPASS GRAFT  07  . ESOPHAGEAL MANOMETRY N/A 08/02/2012   Procedure: ESOPHAGEAL MANOMETRY (EM);  Surgeon: Sable Feil, MD;  Location: [facility] ENDOSCOPY;  Service: Endoscopy;  Laterality: N/A;  . ESOPHAGOGASTRODUODENOSCOPY (EGD) WITH PROPOFOL N/A 04/07/2019   Procedure: ESOPHAGOGASTRODUODENOSCOPY (EGD) WITH PROPOFOL;  Surgeon: Milus Banister, MD;  Location: [facility] ENDOSCOPY;  Service: Endoscopy;  Laterality: N/A;  . HEMOSTASIS CLIP PLACEMENT  04/07/2019   Procedure: HEMOSTASIS CLIP PLACEMENT;  Surgeon: Milus Banister, MD;  Location: [facility] ENDOSCOPY;  Service: Endoscopy;;  . HOT HEMOSTASIS N/A 04/07/2019   Procedure: HOT HEMOSTASIS (ARGON PLASMA COAGULATION/BICAP);  Surgeon: Milus Banister, MD;  Location: [facility];  Service: Endoscopy;  Laterality: N/A;  . POLYPECTOMY  04/07/2019   Procedure: POLYPECTOMY;  Surgeon: Milus Banister, MD;  Location: [facility] ENDOSCOPY;  Service: Endoscopy;;  . [name]  04/07/2019   Procedure: [name];  Surgeon: Milus Banister, MD;  Location: [facility] ENDOSCOPY;  Service: Endoscopy;;  . TEE WITHOUT CARDIOVERSION N/A 04/03/2020   Procedure: TRANSESOPHAGEAL ECHOCARDIOGRAM (TEE);  Surgeon: Dorothy Spark, MD;  Location[facility] ENDOSCOPY;  Service: Cardiovascular;  Laterality: N/A;  . THYROID LOBECTOMY    . UPPER GASTROINTESTINAL ENDOSCOPY       Home  Medications:  Prior to Admission medications   Medication Sig Start Date End Date Taking? Authorizing Provider  acetaminophen (TYLENOL) 500 MG tablet Take 2 tablets (1,000 mg total) by mouth every 8 (eight) hours as needed for mild pain. 07/13/20  Yes Ghimire, Henreitta Leber, MD  ampicillin 2 g in sodium chloride 0.9 % 100 mL Inject 2 g into the vein every 8 (eight) hours. 6 weeks from 07/09/2020. 07/13/20  Yes Ghimire, Henreitta Leber, MD  ampicillin IVPB Inject 2 g into the vein every 6 (six) hours. Indication:  Endocarditis, discitis First Dose: No Last Day of Therapy:  08/20/20 Labs - Once weekly:  CBC/D and BMP, Labs - Every other week:  ESR and CRP Method of administration: Ambulatory Pump (Continuous Infusion) Method of administration may be changed at the discretion of home infusion pharmacist based upon assessment of the patient and/or caregiver's ability to self-administer the medication ordered. 07/27/20  Yes Mikhail, Maryann, DO  atorvastatin (LIPITOR) 80 MG tablet Take 0.5 tablets (40 mg total) by mouth at bedtime. 05/29/20  Yes Burtis Junes, NP  Calcium Carbonate-Vitamin D (CALTRATE 600+D PO) Take 1 tablet by mouth daily with lunch.    Yes [provider]  cefTRIAXone (ROCEPHIN) IVPB Inject 2 g into the vein every 12 (twelve) hours. Indication:  Endocarditis/discitis First Dose: No Last Day of Therapy:  08/20/20 Labs - Once weekly:  CBC/D and BMP, Labs - Every other week:  ESR and CRP Method of administration: IV Push Method of administration may be changed at the discretion of home infusion pharmacist based upon assessment of the patient and/or caregiver's ability to self-administer the medication ordered. 07/27/20  Yes Mikhail, Maryann, DO  cefTRIAXone 2 g in sodium chloride 0.9 % 100 mL Inject 2 g into the vein every 12 (twelve) hours. 6 weeks from 07/09/2020. 07/13/20  Yes Ghimire, Henreitta Leber, MD  doxycycline (VIBRA-TABS) 100 MG tablet Take 100 mg by mouth 2 (two) times daily. For 7  days Course started on 3.23.2022   Yes [provider]  ELIQUIS 2.5 MG TABS tablet TAKE 1 TABLET BY MOUTH TWICE DAILY Patient taking differently: Take 2.5 mg by mouth 2 (two) times daily. 06/28/20  Yes Burchette, Alinda Sierras, MD  gabapentin (NEURONTIN) 100 MG capsule Take 1 capsule (100 mg total) by mouth 2 (two) times daily. 01/24/20  Yes Burchette, Alinda Sierras, MD  hyoscyamine (LEVSIN SL) 0.125 MG SL tablet Place 1 tablet (0.125 mg total) under the tongue every 6 (six) hours as needed. Patient taking differently: Place 0.125 mg under the tongue every 6 (six) hours as needed for cramping. 05/23/19  Yes Burchette, Alinda Sierras, MD  Infant Care Products [facility]) OINT Apply 1 application topically in the morning, at noon, and at bedtime.   Yes [provider]  ipratropium (ATROVENT) 0.03 % nasal spray Place 2 sprays into both nostrils every 12 (twelve) hours.   Yes [provider]  lactase (LACTAID) 3000 units tablet Take 3,000 Units by mouth every 6 (six) hours as needed (lactose Intolerance).   Yes [provider]  lactose free nutrition (BOOST PLUS) LIQD Take 237 mLs by mouth 3 (three) times daily with meals. 07/13/20  Yes Ghimire, Henreitta Leber, MD  levothyroxine (SYNTHROID) 100 MCG tablet Take 100 mcg by mouth daily before breakfast.   Yes [provider]  Lidocaine 4 % PTCH Place 1 patch onto the skin daily as needed (pain.).    Yes [provider]  Lidocaine-Menthol (LIDOCAINE-MENTHOL ROLL-ON) 4-1 % LIQD Apply 1 application topically every 8 (eight) hours as needed (pain).   Yes [provider]  Multiple Vitamins-Minerals (PRESERVISION AREDS 2 PO) Take 1 tablet by mouth 2 (two) times daily.   Yes [provider]  nitroGLYCERIN (NITROSTAT) 0.4 MG SL tablet Place 0.4 mg under the tongue every 5 (five) minutes as needed for chest pain.   Yes [provider]  Polyethyl Glycol-Propyl Glycol (SYSTANE FREE OP) Place 1 drop into both eyes  every 8 (eight) hours as needed (dry eyes).   Yes [provider]  polyethylene glycol (MIRALAX / GLYCOLAX) 17 g packet Take 17 g by mouth daily. 07/14/20  Yes Ghimire, Henreitta Leber, MD  saccharomyces boulardii (FLORASTOR) 250 MG capsule Take 250 mg by mouth daily as needed (digestion).   Yes [provider]  sennosides-docusate sodium (SENOKOT-S) 8.6-50 MG tablet Take 1 tablet by mouth daily.   Yes [provider]  sodium chloride (OCEAN) 0.65 % SOLN nasal spray Place 1 spray into both nostrils daily as needed for congestion.   Yes  [provider]  sucralfate (CARAFATE) 1 GM/10ML suspension TAKE 10 MLS (1 G TOTAL) BY MOUTH 2 (TWO) TIMES DAILY. Patient taking differently: Take 1 g by mouth 2 (two) times daily. 08/23/19  Yes Burchette, Alinda Sierras, MD  traZODone (DESYREL) 50 MG tablet Take 25 mg by mouth at bedtime.   Yes [provider]  furosemide (LASIX) 10 MG/ML injection Inject 10 mg into the muscle once. Patient not taking: No sig reported    [provider]  furosemide (LASIX) 20 MG tablet Take 3 tablets (60 mg total) by mouth daily. 07/28/20   Mikhail, Velta Addison, DO  furosemide (LASIX) 40 MG tablet Take 40 mg by mouth daily. Patient not taking: No sig reported    [provider]  levothyroxine (SYNTHROID) 75 MCG tablet Take 1 tablet (75 mcg total) by mouth daily at 6 (six) AM. 07/28/20   Mikhail, Velta Addison, DO  oxyCODONE (OXY IR/ROXICODONE) 5 MG immediate release tablet Take 1 tablet (5 mg total) by mouth every 6 (six) hours as needed for severe pain. 07/27/20   Cristal Ford, DO  spironolactone (ALDACTONE) 25 MG tablet Take 12.5 mg by mouth daily. For 4 days Course started on 3.24.22 and ends on 3.27.22 Patient not taking: No sig reported    [provider]  spironolactone (ALDACTONE) 25 MG tablet Take 25 mg by mouth daily. Patient not taking: No sig reported    [provider]    Inpatient Medications: Scheduled Meds: .  apixaban  2.5 mg Oral BID  . atorvastatin  40 mg Oral QHS  . Chlorhexidine Gluconate Cloth  6 each Topical Daily  . furosemide  60 mg Oral Daily  . lactose free nutrition  237 mL Oral TID WC  . levothyroxine  75 mcg Oral Q0600  . multivitamin with minerals  1 tablet Oral Daily  . polyethylene glycol  17 g Oral Daily  . traZODone  25 mg Oral QHS   Continuous Infusions: . ampicillin IVPB 2 gram/NS 100 mL (Mini-Bag Plus) 2 g (07/28/20 0524)  . cefTRIAXone (ROCEPHIN) IVPB 2 gram/100 mL NS (Mini-Bag Plus) 2 g (07/28/20 1004)   PRN Meds: acetaminophen **OR** acetaminophen, hyoscyamine, lactase, lidocaine, lidocaine, oxyCODONE, saccharomyces boulardii, sucralfate  Allergies:    Allergies  Allergen Reactions  . Actos [Pioglitazone] Swelling    Ankle swelling  . Sulfonamide Derivatives Swelling  . Adhesive [Tape] Other (See Comments)    WITH PROLONGED EXPOSURE (REDNESS/ITCHING)  . Codeine Nausea And Vomiting    Social History:   Social History   Socioeconomic History  . Marital status: Married    Spouse name: [name]  . Number of children: 0  . Years of education: 62  . Highest education level: Not on file  Occupational History  . Occupation: retired    Fish farm manager: RETIRED  Tobacco Use  . Smoking status: Never Smoker  . Smokeless tobacco: Never Used  Vaping Use  . Vaping Use: Never used  Substance and Sexual Activity  . Alcohol use: No    Alcohol/week: 0.0 standard drinks  . Drug use: No  . Sexual activity: Not on file  Other Topics Concern  . Not on file  Social History Narrative   [name], Married. Lives with husband. Had been a very active woman.   Social Determinants of Health   Financial Resource Strain: Low Risk   . Difficulty of Paying Living Expenses: Not hard at all  Food Insecurity: Not on file  Transportation Needs: No Transportation Needs  . Lack of Transportation (  Medical): No  . Lack of Transportation (Non-Medical): No  Physical Activity: Not on file   Stress: Not on file  Social Connections: Not on file  Intimate Partner Violence: Not on file    Family History:    Family History  Problem Relation Age of Onset  . Heart disease Mother   . Cholelithiasis Mother   . Prostate cancer Brother   . Colon cancer Brother   . Breast cancer Other        niece - breast cancer  . Diabetes Sister   . Lung cancer Father   . Lung cancer Sister   . Stomach cancer Cousin   . Esophageal cancer Neg Hx   . Rectal cancer Neg Hx      ROS:  Please see the history of present illness.   All other ROS reviewed and negative.     Physical Exam/Data:   Vitals:   07/27/20 2319 07/28/20 0304 07/28/20 0320 07/28/20 0800  BP: (!) 109/35 (!) 133/47  103/84  Pulse: 60 (!) 56  64  Resp: _0 Temp: 98.6 F (37 C) 97.9 F (36.6 C)  98.2 F (36.8 C)  TempSrc: Oral Oral  Oral  SpO2: 97% 94%  96%  Weight:   43.7 kg   Height:        Intake/Output Summary (Last 24 hours) at 07/28/2020 1007 Last data filed at 07/28/2020 0800 Gross per 24 hour  Intake 590 ml  Output 1750 ml  Net -1160 ml   Last 3 Weights 07/28/2020 07/27/2020 07/26/2020  Weight (lbs) 96 lb 5.5 oz 98 lb 1.7 oz 102 lb 11.8 oz  Weight (kg) 43.7 kg 44.5 kg 46.6 kg     Body mass index is 17.07 kg/m.  General:  Well nourished, well developed, in no acute distress HEENT: normal Lymph: no adenopathy Neck: no JVD Endocrine:  No thryomegaly Vascular: No carotid bruits; FA pulses 2+ bilaterally without bruits  Cardiac:  normal S1, S2; RRR; no murmur  Lungs:  clear to auscultation bilaterally, no wheezing, rhonchi or rales  Abd: soft, nontender, no hepatomegaly  Ext: no edema Musculoskeletal:  No deformities, BUE and BLE strength normal and equal Skin: warm and dry  Neuro:  CNs 2-12 intact, no focal abnormalities noted Psych:  Normal affect   EKG:  The EKG was personally reviewed and demonstrates: Sinus rhythm with first-degree AV block.  No significant ST-T wave changes. Telemetry:   Telemetry was personally reviewed and demonstrates: Partly sinus rhythm with occasional junctional rhythm and prolonged pauses.  Longest episode 6.5 seconds.  Relevant CV Studies:  Echo 07/23/2020 1. Left ventricular ejection fraction, by estimation, is 50%. The left  ventricle has mildly decreased function. The left ventricle demonstrates  regional wall motion abnormalities, septal-lateral dyssychrony consistent  with LBBB and mild septal  hypokinesis. Left ventricular diastolic parameters are consistent with  Grade II diastolic dysfunction (pseudonormalization).  2. Right ventricular systolic function is mildly reduced. The right  ventricular size is normal. There is mildly elevated pulmonary artery  systolic pressure.  3. There is a small (0.5 x 0.9 cm) mobile vegetation noted on the  ventricular size of the aortic valve. The aortic valve is tricuspid.  Aortic valve regurgitation is moderate to severe and eccentric, possible  holodiastolic flow reversal in descending  thoracic aorta (signal not great). No aortic stenosis is present.  4. The mitral valve is abnormal. Severe mitral valve regurgitation,  eccentric anterior jet likely due to P2 prolapse vs  partial flail. No  evidence of mitral stenosis.  5. Left atrial size was mildly dilated.  6. The inferior vena cava is normal in size with greater than 50%  respiratory variability, suggesting right atrial pressure of 3 mmHg.  7. Pleural effusion noted on left.  8. Impression: Aortic valve endocarditis, mod-severe aortic  insufficiency, severe MR.   Laboratory Data:  High Sensitivity Troponin:   Recent Labs  Lab 07/04/20 1014 07/22/20 1927 07/22/20 2110 07/23/20 0812 07/23/20 1250  TROPONINIHS 24* 37* 65* 54* 37*     Chemistry Recent Labs  Lab 07/26/20 0500 07/27/20 0340 07/28/20 0350  NA 136 138 133*  K 3.3* 4.3 3.8  CL 98 100 97*  CO2 35* 34* 31  GLUCOSE 86 79 72  BUN _0 CREATININE 0.90 0.89 0.89   CALCIUM 7.5* 7.9* 7.7*  GFRNONAA >60 >60 >60  ANIONGAP 3* 4* 5    Recent Labs  Lab 07/22/20 1927 07/23/20 0812 07/24/20 0455  PROT 5.5*  --   --   ALBUMIN 2.0* 1.9* 1.8*  AST 36  --   --   ALT 32  --   --   ALKPHOS 78  --   --   BILITOT 0.5  --   --    Hematology Recent Labs  Lab 07/24/20 0455 07/25/20 0545 07/26/20 0500 07/27/20 0340  WBC 8.1 7.3 6.7  --   RBC 3.16* 3.22* 2.95*  --   HGB 8.8* 8.8* 8.0* 8.3*  HCT 26.8* 27.5* 25.1* 25.4*  MCV 84.8 85.4 85.1  --   MCH 27.8 27.3 27.1  --   MCHC 32.8 32.0 31.9  --   RDW 18.6* 18.0* 18.0*  --   PLT 211 195 184  --    BNP Recent Labs  Lab 07/22/20 1927  BNP 640.7*    DDimer No results for input(s): DDIMER in the last 168 hours.   Radiology/Studies:  No results found.   Assessment and Plan:   1. Severe bradycardia with junctional beats and sinus pause: Patient was asymptomatic during the event.  Amiodarone was discontinued during the last admission.  Patient is no longer on any AV nodal blocking agent at this time.  Bradycardia only occurred during sleep.  She is not a candidate for pacemaker therapy at this time due to aortic valve vegetation.  Will discuss with MD, would recommend continue monitoring at this time.  2. Acute on chronic combined systolic and diastolic heart failure: Appears to be euvolemic on exam.  I&O -4 L.  She is breathing much better and does not appear to be volume overloaded on exam.  3. Aortic valve vegetation with severe AI: Diagnosed recently on echocardiogram 07/06/2020.  Patient is not a surgical candidate per Dr. Cyndia Bent.  On IV antibiotic only  4. Severe MR: Known severe MR since at least 2021.  Patient is not a surgical candidate.  5. PAF: Off of amiodarone and beta-blocker during the recent hospitalization.  She is on low-dose Eliquis.  Maintaining sinus rhythm  6. Hypertension: Blood pressure stable  7. Hypothyroidism    Risk Assessment/Risk Scores:        New York Heart  Association (NYHA) Functional Class NYHA Class II  CHA2DS2-VASc Score = 6  This indicates a 9.7% annual risk of stroke. The patient's score is based upon: CHF History: Yes HTN History: Yes Diabetes History: No Stroke History: No Vascular Disease History: Yes Age Score: 2 Gender Score: 1  For questions or updates, please contact [place] Please consult [email or web address] for contact info under    Signed, Almyra Deforest, PA  07/28/2020 10:08 AM'  Bradycardia with simultaneous sinus slowing and AV block while asleep  PAF on low-dose anticoagulation  Hypertension  MR-severe//AI-moderate-severe not operative candidate  Discitis with enterococcal bacteremia and aortic valve endocarditis on long-term antibiotics  Dyspnea on exertion  The patient has had a complex cardiac situation with severe valvular disease some sinus slowing and paroxysmal atrial fibrillation.  Atrial fibrillation would likely become worse as the MR continues to result in left atriopathy.  Her bradycardia may also become consequential been as antiarrhythmic therapy/AV nodal therapy will be required.  However, the bradycardic events of this hospitalization seemingly were associated with sleep.  Consistent with our hypothesis is that they manifest hyper vagotonia with simultaneous sinus slowing and AV block, these simultaneous events demonstrated that this is almost certainly not primary arrhythmic events.  No specific therapy is indicated.  Call if we can be of further assistance.

## 2020-07-29 DIAGNOSIS — I5033 Acute on chronic diastolic (congestive) heart failure: Secondary | ICD-10-CM | POA: Diagnosis not present

## 2020-07-29 DIAGNOSIS — M4644 Discitis, unspecified, thoracic region: Secondary | ICD-10-CM | POA: Diagnosis not present

## 2020-07-29 DIAGNOSIS — R2681 Unsteadiness on feet: Secondary | ICD-10-CM | POA: Diagnosis not present

## 2020-07-29 DIAGNOSIS — I4891 Unspecified atrial fibrillation: Secondary | ICD-10-CM | POA: Diagnosis not present

## 2020-07-29 DIAGNOSIS — E039 Hypothyroidism, unspecified: Secondary | ICD-10-CM | POA: Diagnosis not present

## 2020-07-29 DIAGNOSIS — L89159 Pressure ulcer of sacral region, unspecified stage: Secondary | ICD-10-CM | POA: Diagnosis not present

## 2020-07-29 DIAGNOSIS — M4646 Discitis, unspecified, lumbar region: Secondary | ICD-10-CM | POA: Diagnosis not present

## 2020-07-30 ENCOUNTER — Telehealth: Payer: Self-pay | Admitting: Pharmacist

## 2020-07-30 DIAGNOSIS — I5020 Unspecified systolic (congestive) heart failure: Secondary | ICD-10-CM | POA: Diagnosis not present

## 2020-07-30 DIAGNOSIS — I10 Essential (primary) hypertension: Secondary | ICD-10-CM | POA: Diagnosis not present

## 2020-07-30 DIAGNOSIS — Z79899 Other long term (current) drug therapy: Secondary | ICD-10-CM | POA: Diagnosis not present

## 2020-07-30 NOTE — Chronic Care Management (AMB) (Signed)
Chronic Care Management Pharmacy Assistant   Name: [name]  MRN: [id number] DOB: Aug 23, 1932   Reason for Encounter: Hypertension Adherence Call   Recent office visits:  06/08/20-Burchette, Alinda Sierras, MD (OV).   Recent consult visits:  None  Hospital visits:  Medication Reconciliation was completed by comparing discharge summary, patient's EMR and Pharmacy list, and upon discussion with patient.   Admitted to the hospital on 07/04/20 due to Endocarditis of mitral valve.  Discharge date was 07/13/20. Discharged from [facility].    Admitted to the hospital on 07/22/20 due to Leg Swelling. low oxy Discharge date was 07/28/20. Discharged from [facility]?Medications Started at [facility] Discharge:?? -started polyethylene glycol 17 g packet.  Medication Changes at Hospital Discharge: -Changed  acetaminophen 500 MG tablet, Eliquis 2.5 MG Tabs tablet, gabapentin 100 MG capsule, hyoscyamine 0.125 MG SL tablet, sucralfate 1 GM/10ML suspension.   Medications Discontinued at Hospital Discharge: -Stopped amiodarone 100 MG tablet, losartan 100 MG tablet, losartan 100 MG tablet, traMADol 50 MG tablet.  Medications that remain the same after Hospital Discharge:?? -All other medications will remain the same.    Admitted to the hospital on 07/22/20 due to Leg Swelling. low oxy Discharge date was 07/28/20. Discharged from [facility].      Medications: Outpatient Encounter Medications as of 07/30/2020  Medication Sig  . acetaminophen (TYLENOL) 500 MG tablet Take 2 tablets (1,000 mg total) by mouth every 8 (eight) hours as needed for mild pain.  Marland Kitchen ampicillin IVPB Inject 2 g into the vein every 6 (six) hours. Indication:  Endocarditis, discitis First Dose: No Last Day of Therapy:  08/20/20 Labs - Once weekly:  CBC/D and BMP, Labs - Every other week:  ESR and CRP Method of administration: Ambulatory Pump (Continuous  Infusion) Method of administration may be changed at the discretion of home infusion pharmacist based upon assessment of the patient and/or caregiver's ability to self-administer the medication ordered.  Marland Kitchen atorvastatin (LIPITOR) 80 MG tablet Take 0.5 tablets (40 mg total) by mouth at bedtime.  . Calcium Carbonate-Vitamin D (CALTRATE 600+D PO) Take 1 tablet by mouth daily with lunch.   . cefTRIAXone (ROCEPHIN) IVPB Inject 2 g into the vein every 12 (twelve) hours. Indication:  Endocarditis/discitis First Dose: No Last Day of Therapy:  08/20/20 Labs - Once weekly:  CBC/D and BMP, Labs - Every other week:  ESR and CRP Method of administration: IV Push Method of administration may be changed at the discretion of home infusion pharmacist based upon assessment of the patient and/or caregiver's ability to self-administer the medication ordered.  Marland Kitchen ELIQUIS 2.5 MG TABS tablet TAKE 1 TABLET BY MOUTH TWICE DAILY (Patient taking differently: Take 2.5 mg by mouth 2 (two) times daily.)  . furosemide (LASIX) 20 MG tablet Take 3 tablets (60 mg total) by mouth daily.  Marland Kitchen gabapentin (NEURONTIN) 100 MG capsule Take 1 capsule (100 mg total) by mouth 2 (two) times daily.  . hyoscyamine (LEVSIN SL) 0.125 MG SL tablet Place 1 tablet (0.125 mg total) under the tongue every 6 (six) hours as needed. (Patient taking differently: Place 0.125 mg under the tongue every 6 (six) hours as needed for cramping.)  . Infant Care Products [facility]) OINT Apply 1 application topically in the morning, at noon, and at bedtime.  Marland Kitchen ipratropium (ATROVENT) 0.03 % nasal spray Place 2 sprays into both nostrils every 12 (twelve) hours.  Marland Kitchen lactase (LACTAID) 3000 units  tablet Take 3,000 Units by mouth every 6 (six) hours as needed (lactose Intolerance).  . lactose free nutrition (BOOST PLUS) LIQD Take 237 mLs by mouth 3 (three) times daily with meals.  Marland Kitchen levothyroxine (SYNTHROID) 75 MCG tablet Take 1 tablet (75 mcg total) by mouth daily at 6  (six) AM.  . Lidocaine 4 % PTCH Place 1 patch onto the skin daily as needed (pain.).   Marland Kitchen Lidocaine-Menthol (LIDOCAINE-MENTHOL ROLL-ON) 4-1 % LIQD Apply 1 application topically every 8 (eight) hours as needed (pain).  . Multiple Vitamins-Minerals (PRESERVISION AREDS 2 PO) Take 1 tablet by mouth 2 (two) times daily.  . nitroGLYCERIN (NITROSTAT) 0.4 MG SL tablet Place 0.4 mg under the tongue every 5 (five) minutes as needed for chest pain.  Marland Kitchen oxyCODONE (OXY IR/ROXICODONE) 5 MG immediate release tablet Take 1 tablet (5 mg total) by mouth every 6 (six) hours as needed for severe pain.  Vladimir Faster Glycol-Propyl Glycol (SYSTANE FREE OP) Place 1 drop into both eyes every 8 (eight) hours as needed (dry eyes).  . polyethylene glycol (MIRALAX / GLYCOLAX) 17 g packet Take 17 g by mouth daily.  Marland Kitchen saccharomyces boulardii (FLORASTOR) 250 MG capsule Take 250 mg by mouth daily as needed (digestion).  . sennosides-docusate sodium (SENOKOT-S) 8.6-50 MG tablet Take 1 tablet by mouth daily.  . sodium chloride (OCEAN) 0.65 % SOLN nasal spray Place 1 spray into both nostrils daily as needed for congestion.  . sucralfate (CARAFATE) 1 GM/10ML suspension TAKE 10 MLS (1 G TOTAL) BY MOUTH 2 (TWO) TIMES DAILY. (Patient taking differently: Take 1 g by mouth 2 (two) times daily.)  . traZODone (DESYREL) 50 MG tablet Take 25 mg by mouth at bedtime.   No facility-administered encounter medications on file as of 07/30/2020.    Reviewed chart prior to disease state call. Spoke with patient regarding BP  Recent Office Vitals: BP Readings from Last 3 Encounters:  07/28/20 (!) 120/40  07/13/20 (!) 135/34  06/20/20 120/60   Pulse Readings from Last 3 Encounters:  07/28/20 (!) 58  07/13/20 (!) 49  06/20/20 (!) 51    Wt Readings from Last 3 Encounters:  07/28/20 96 lb 5.5 oz (43.7 kg)  07/09/20 95 lb 10.9 oz (43.4 kg)  06/20/20 88 lb 3.2 oz (40 kg)     Kidney Function Lab Results  Component Value Date/Time   CREATININE  0.89 07/28/2020 03:50 AM   CREATININE 0.89 07/27/2020 03:40 AM   GFR 63.57 10/27/2019 03:22 PM   GFRNONAA >60 07/28/2020 03:50 AM   GFRAA 53 (L) 05/23/2020 02:31 PM    BMP Latest Ref Rng & Units 07/28/2020 07/27/2020 07/26/2020  Glucose 70 - 99 mg/dL 72 79 86  BUN 8 - 23 mg/dL 10 9 10   Creatinine 0.44 - 1.00 mg/dL 0.89 0.89 0.90  BUN/Creat Ratio 12 - 28 - - -  Sodium 135 - 145 mmol/L 133(L) 138 136  Potassium 3.5 - 5.1 mmol/L 3.8 4.3 3.3(L)  Chloride 98 - 111 mmol/L 97(L) 100 98  CO2 22 - 32 mmol/L 31 34(H) 35(H)  Calcium 8.9 - 10.3 mg/dL 7.7(L) 7.9(L) 7.5(L)    . Current antihypertensive regimen:   Furosemide 20 MG  Eliquis 2.5 MG  . What recent interventions/DTPs have been made by any provider to improve Blood Pressure control since last CPP Visit:  ? Check blood pressure weekly, document, and provide at future appointments ? Ensure daily salt intake < 2300 mg/day ? Patient will look at [facility] catalog to see if  she is able to get a new BP cuff  . Any recent hospitalizations or ED visits since last visit with CPP? Yes    Adherence Review: Is the patient currently on ACE/ARB medication? No    Star Rating Drugs: Atorvastatin 80MG: 90 DS, last filled on 05/01/20 at [facility].  Note: 3 telephone outreach attempts made to the patient regarding her blood pressure monitoring. No answer, left message for a call back.   Jeni Salles, CPP Notified.   Raynelle Highland, [place] Pharmacist Assistant [phone] CCM Total Time: 46 minutes

## 2020-08-01 DIAGNOSIS — L89152 Pressure ulcer of sacral region, stage 2: Secondary | ICD-10-CM | POA: Diagnosis not present

## 2020-08-01 DIAGNOSIS — L89620 Pressure ulcer of left heel, unstageable: Secondary | ICD-10-CM | POA: Diagnosis not present

## 2020-08-01 DIAGNOSIS — L988 Other specified disorders of the skin and subcutaneous tissue: Secondary | ICD-10-CM | POA: Diagnosis not present

## 2020-08-04 DIAGNOSIS — Z79899 Other long term (current) drug therapy: Secondary | ICD-10-CM | POA: Diagnosis not present

## 2020-08-05 DIAGNOSIS — I5032 Chronic diastolic (congestive) heart failure: Secondary | ICD-10-CM | POA: Diagnosis not present

## 2020-08-06 DIAGNOSIS — D649 Anemia, unspecified: Secondary | ICD-10-CM | POA: Diagnosis not present

## 2020-08-06 DIAGNOSIS — I2510 Atherosclerotic heart disease of native coronary artery without angina pectoris: Secondary | ICD-10-CM | POA: Diagnosis not present

## 2020-08-07 ENCOUNTER — Non-Acute Institutional Stay: Payer: Self-pay | Admitting: Hospice

## 2020-08-07 ENCOUNTER — Other Ambulatory Visit: Payer: Self-pay

## 2020-08-07 DIAGNOSIS — Z515 Encounter for palliative care: Secondary | ICD-10-CM | POA: Diagnosis not present

## 2020-08-07 DIAGNOSIS — I5043 Acute on chronic combined systolic (congestive) and diastolic (congestive) heart failure: Secondary | ICD-10-CM

## 2020-08-07 DIAGNOSIS — I358 Other nonrheumatic aortic valve disorders: Secondary | ICD-10-CM

## 2020-08-07 DIAGNOSIS — I5021 Acute systolic (congestive) heart failure: Secondary | ICD-10-CM

## 2020-08-07 NOTE — Progress Notes (Signed)
PATIENT NAME: [name] [address] [place] [address] [phone] (home)  DOB: Sep 26, 1932 MRN: [id number]  PRIMARY CARE PROVIDER:    [name], MD,  [place] [address] [phone]  REFERRING PROVIDER:   Eulas Valdez, [place],  [place] [address] [phone]  RESPONSIBLE PARTY: Self Emergency Contact: [name]   CHIEF COMPLAIN: Initial palliative care visit/Congestive heart failure  Visit is to build trust and highlight Palliative Medicine as specialized medical care for people living with serious illness, aimed at facilitating better quality of life through symptoms relief, assisting with advance care plan and establishing goals of care. Patient endorsed palliative service.   Discussion on the difference between Palliative and Hospice care.  Visit consisted of counseling and education dealing with the complex and emotionally intense issues of symptom management and palliative care in the setting of serious and potentially life-threatening illness. Palliative care team will continue to support patient, patient's family, and medical team.  RECOMMENDATIONS/PLAN:   Advance Care Planning: Our advance care planning conversation included a discussion about  the value and importance of advance care planning, exploration of goals of care in the event of a sudden injury or illness, identification and preparation of a healthcare agent and review and updating or creation of an advance directive document. Provided general support, encouragement and ample emotional support.  ADVANCE DIRECTIVES/CODE STATUS:Patient affirmed she is Do Not Resuscitate. MOST selections include Comfort measures, IV fluids for a defined period, antibiotics as indicated, no feeding tube.   GOALS OF CARE: Goals of care include to maximize quality of life and symptom management.  I spent 46  minutes providing this initial  consultation. More than 50% of the time in this consultation was spent on counseling patient and coordinating communication. -------------------------------------------------------------------------------------------------------------------------------------- Symptom Management/Plan:  Congestive heart failure: Continue Aldactone and furosemide as ordered.  Encourage elevation of bilateral lower extremities during the day as much as possible.  No added salt.  If with weight increase of 3 pounds a day or 5 pounds in a week, give additional dose of furosemide 20 mg and notify provider.  Routine CBC BMP. Weekly weight check. Cardiologist consult as planned/needed. Weakness: Continue PT/OT.  Balance of rest and performance activity Endocarditis: IV antibiotics is ongoing. Per Epic chart review, Patient was started on ceftriaxone ampicillin with plans for 6 weeks starting on 07/09/2020. Monitor for any adverse reactions and notify provider; currently well-tolerated. Echocardiogram showed Small (0.5 x 0.9 cm) mobile vegetation on ventricular size of aortic valve.  Continue Eliquis for paroxysmal A. fib.   Follow up Palliative Care Visit: Palliative care will continue to follow for complex medical decision making, advance care planning, and clarification of goals. Return 6 weeks or prn. Encouraged to call provider sooner with any concerns.   PPS: 50%  Family /Caregiver/Community Supports:Patient in SNF for ongoing care     HISTORY OF PRESENT ILLNESS:  [name] is a 87 y.o. female with multiple medical problems including  including combined diastolic and systolic congestive heart failure which has been going on greater than 2 years, with exacerbation in the last month resulting in hospitalization 3/27-07/28/2020.  Patient was hospitalized for acute hypoxemic respiratory failure related to congestive heart failure. Patient endorsed weakness related to CHF, and reduced overall physical function and quality  of life. Ongoing diuretics and physical therapy are helpful.  History of CAD, Afib Enterococcus bacteremia/aortic valve endocarditis with possible T12-L1 discitis,  protein caloric malnutrition, hypothyroidism.  History obtained from review of EMR, discussion with primary nursing staff and patient/family.   Review and summarization of Epic records shows history from other than patient. Rest of 10 point ROS asked and negative.  Palliative Care was asked to follow this patient by consultation request of Dr. Leanna Valdez to help address complex decision making in the context of advance care planning and goals of care clarification.    HOSPICE ELIGIBILITY/DIAGNOSIS: TBD  PAST MEDICAL HISTORY:  Past Medical History:  Diagnosis Date  . Allergy   . Anemia, pernicious   . Arthritis    bursitis   . Atrophic gastritis   . B12 deficiency   . Bacteremia due to Enterococcus 07/09/2020  . CAD (coronary artery disease)   . Carotid artery stenosis   . Cataract   . Diarrhea    on and off for months  . Family history of adverse reaction to anesthesia    extreme nausea brother and sister  . Fibromyalgia   . Gallbladder polyp   . GERD (gastroesophageal reflux disease)   . Heart murmur   . History of hiatal hernia   . History of kidney stones   . HLD (hyperlipidemia)   . HTN (hypertension)   . Hypothyroidism   . Irritable bowel syndrome (IBS)   . Median arcuate ligament syndrome ([place])   . Osteoporosis   . PVD (peripheral vascular disease) ([place])   . Tubular adenoma of colon 08/2013   with focal high grade dysplasia     SOCIAL HX:  Social History   Tobacco Use  . Smoking status: Never Smoker  . Smokeless tobacco: Never Used  Substance Use Topics  . Alcohol use: No    Alcohol/week: 0.0 standard drinks     FAMILY HX:  Family History  Problem Relation Age of Onset  . Heart disease Mother   . Cholelithiasis Mother   . Prostate cancer Brother   . Colon cancer Brother   . Breast cancer  Other        niece - breast cancer  . Diabetes Sister   . Lung cancer Father   . Lung cancer Sister   . Stomach cancer Cousin   . Esophageal cancer Neg Hx   . Rectal cancer Neg Hx     Review of lab tests/diagnostics   Results for Sally, Valdez (MRN [id number]) as of 08/07/2020 11:03  Ref. Range 07/27/2020 03:40 07/27/2020 06:24 07/28/2020 35:00  BASIC METABOLIC PANEL Unknown Rpt (A)  Rpt (A)  Sodium Latest Ref Range: 135 - 145 mmol/L 138  133 (L)  Potassium Latest Ref Range: 3.5 - 5.1 mmol/L 4.3  3.8  Chloride Latest Ref Range: 98 - 111 mmol/L 100  97 (L)  CO2 Latest Ref Range: 22 - 32 mmol/L 34 (H)  31  Glucose Latest Ref Range: 70 - 99 mg/dL 79  72  BUN Latest Ref Range: 8 - 23 mg/dL 9  10  Creatinine Latest Ref Range: 0.44 - 1.00 mg/dL 0.89  0.89  Calcium Latest Ref Range: 8.9 - 10.3 mg/dL 7.9 (L)  7.7 (L)  Anion gap Latest Ref Range: 5 - 15  4 (L)  5  GFR, Estimated Latest Ref Range: >60 mL/min >60  >60  Hemoglobin Latest Ref Range: 12.0 - 15.0 g/dL 8.3 (L)    HCT Latest Ref Range: 36.0 - 46.0 % 25.4 (L)     ALLERGIES:  Allergies  Allergen Reactions  . Actos [Pioglitazone] Swelling    Ankle swelling  . Sulfonamide  Derivatives Swelling  . Adhesive [Tape] Other (See Comments)    WITH PROLONGED EXPOSURE (REDNESS/ITCHING)  . Codeine Nausea And Vomiting      PERTINENT MEDICATIONS:  Outpatient Encounter Medications as of 08/07/2020  Medication Sig  . acetaminophen (TYLENOL) 500 MG tablet Take 2 tablets (1,000 mg total) by mouth every 8 (eight) hours as needed for mild pain.  Marland Kitchen ampicillin IVPB Inject 2 g into the vein every 6 (six) hours. Indication:  Endocarditis, discitis First Dose: No Last Day of Therapy:  08/20/20 Labs - Once weekly:  CBC/D and BMP, Labs - Every other week:  ESR and CRP Method of administration: Ambulatory Pump (Continuous Infusion) Method of administration may be changed at the discretion of home infusion pharmacist based upon assessment of the patient  and/or caregiver's ability to self-administer the medication ordered.  Marland Kitchen atorvastatin (LIPITOR) 80 MG tablet Take 0.5 tablets (40 mg total) by mouth at bedtime.  . Calcium Carbonate-Vitamin D (CALTRATE 600+D PO) Take 1 tablet by mouth daily with lunch.   . cefTRIAXone (ROCEPHIN) IVPB Inject 2 g into the vein every 12 (twelve) hours. Indication:  Endocarditis/discitis First Dose: No Last Day of Therapy:  08/20/20 Labs - Once weekly:  CBC/D and BMP, Labs - Every other week:  ESR and CRP Method of administration: IV Push Method of administration may be changed at the discretion of home infusion pharmacist based upon assessment of the patient and/or caregiver's ability to self-administer the medication ordered.  Marland Kitchen ELIQUIS 2.5 MG TABS tablet TAKE 1 TABLET BY MOUTH TWICE DAILY (Patient taking differently: Take 2.5 mg by mouth 2 (two) times daily.)  . furosemide (LASIX) 20 MG tablet Take 3 tablets (60 mg total) by mouth daily.  Marland Kitchen gabapentin (NEURONTIN) 100 MG capsule Take 1 capsule (100 mg total) by mouth 2 (two) times daily.  . hyoscyamine (LEVSIN SL) 0.125 MG SL tablet Place 1 tablet (0.125 mg total) under the tongue every 6 (six) hours as needed. (Patient taking differently: Place 0.125 mg under the tongue every 6 (six) hours as needed for cramping.)  . Infant Care Products [facility]) OINT Apply 1 application topically in the morning, at noon, and at bedtime.  Marland Kitchen ipratropium (ATROVENT) 0.03 % nasal spray Place 2 sprays into both nostrils every 12 (twelve) hours.  Marland Kitchen lactase (LACTAID) 3000 units tablet Take 3,000 Units by mouth every 6 (six) hours as needed (lactose Intolerance).  . lactose free nutrition (BOOST PLUS) LIQD Take 237 mLs by mouth 3 (three) times daily with meals.  Marland Kitchen levothyroxine (SYNTHROID) 75 MCG tablet Take 1 tablet (75 mcg total) by mouth daily at 6 (six) AM.  . Lidocaine 4 % PTCH Place 1 patch onto the skin daily as needed (pain.).   Marland Kitchen Lidocaine-Menthol (LIDOCAINE-MENTHOL ROLL-ON)  4-1 % LIQD Apply 1 application topically every 8 (eight) hours as needed (pain).  . Multiple Vitamins-Minerals (PRESERVISION AREDS 2 PO) Take 1 tablet by mouth 2 (two) times daily.  . nitroGLYCERIN (NITROSTAT) 0.4 MG SL tablet Place 0.4 mg under the tongue every 5 (five) minutes as needed for chest pain.  Marland Kitchen oxyCODONE (OXY IR/ROXICODONE) 5 MG immediate release tablet Take 1 tablet (5 mg total) by mouth every 6 (six) hours as needed for severe pain.  Vladimir Faster Glycol-Propyl Glycol (SYSTANE FREE OP) Place 1 drop into both eyes every 8 (eight) hours as needed (dry eyes).  . polyethylene glycol (MIRALAX / GLYCOLAX) 17 g packet Take 17 g by mouth daily.  Marland Kitchen saccharomyces boulardii (FLORASTOR) 250 MG capsule  Take 250 mg by mouth daily as needed (digestion).  . sennosides-docusate sodium (SENOKOT-S) 8.6-50 MG tablet Take 1 tablet by mouth daily.  . sodium chloride (OCEAN) 0.65 % SOLN nasal spray Place 1 spray into both nostrils daily as needed for congestion.  . sucralfate (CARAFATE) 1 GM/10ML suspension TAKE 10 MLS (1 G TOTAL) BY MOUTH 2 (TWO) TIMES DAILY. (Patient taking differently: Take 1 g by mouth 2 (two) times daily.)  . traZODone (DESYREL) 50 MG tablet Take 25 mg by mouth at bedtime.   No facility-administered encounter medications on file as of 08/07/2020.   ROS  General: NAD EYES: No vision changes ENMT: No dysphagia no xerostomia Cardiovascular: No chest pain Pulmonary: No cough, endorses shortness of breath on exertion Abdomen:no constipation or diarrhea GU: No dysuria or urinary frequency MSK:   ROM limitations Skin: No rashes or wounds Neurological: weakness Psych:  positive mood Heme/lymph/immuno: No bruises or abnormal bleeding   PHYSICAL EXAM  Constitutional: In no acute distress Cardiovascular: regular rate and rhythm; 1+ pitting edema in BLE Pulmonary: no cough,  normal respiratory effort on RA; as needed oxygen supplementation at 2 L/min Abdomen: soft, non tender,  positive bowel sounds in all quadrants GU:  no suprapubic tenderness Eyes: Normal lids, no discharge, sclera anicteric ENMT: Moist mucous membranes Musculoskeletal: weakness, moderate sarcopenia Skin: dry skin, warm without cyanosis Psych: non-anxious affect Neurological: Weakness but otherwise non focal Heme/lymph/immuno: no bruises, no bleeding  Thank you for the opportunity to participate in the care of WALLIS SPIZZIRRI Please call our office at [phone] if we can be of additional assistance.  Note: Portions of this note were generated with [employer]. Dictation errors may occur despite best attempts at proofreading.  Teodoro Spray, NP

## 2020-08-08 DIAGNOSIS — L89620 Pressure ulcer of left heel, unstageable: Secondary | ICD-10-CM | POA: Diagnosis not present

## 2020-08-08 DIAGNOSIS — L89152 Pressure ulcer of sacral region, stage 2: Secondary | ICD-10-CM | POA: Diagnosis not present

## 2020-08-08 DIAGNOSIS — L988 Other specified disorders of the skin and subcutaneous tissue: Secondary | ICD-10-CM | POA: Diagnosis not present

## 2020-08-14 DIAGNOSIS — Z79899 Other long term (current) drug therapy: Secondary | ICD-10-CM | POA: Diagnosis not present

## 2020-08-15 DIAGNOSIS — L988 Other specified disorders of the skin and subcutaneous tissue: Secondary | ICD-10-CM | POA: Diagnosis not present

## 2020-08-15 DIAGNOSIS — L89152 Pressure ulcer of sacral region, stage 2: Secondary | ICD-10-CM | POA: Diagnosis not present

## 2020-08-15 DIAGNOSIS — L89620 Pressure ulcer of left heel, unstageable: Secondary | ICD-10-CM | POA: Diagnosis not present

## 2020-08-17 DIAGNOSIS — I5031 Acute diastolic (congestive) heart failure: Secondary | ICD-10-CM | POA: Diagnosis not present

## 2020-08-17 DIAGNOSIS — N179 Acute kidney failure, unspecified: Secondary | ICD-10-CM | POA: Diagnosis not present

## 2020-08-17 DIAGNOSIS — I480 Paroxysmal atrial fibrillation: Secondary | ICD-10-CM | POA: Diagnosis not present

## 2020-08-17 DIAGNOSIS — D638 Anemia in other chronic diseases classified elsewhere: Secondary | ICD-10-CM | POA: Diagnosis not present

## 2020-08-20 DIAGNOSIS — Z79899 Other long term (current) drug therapy: Secondary | ICD-10-CM | POA: Diagnosis not present

## 2020-08-22 ENCOUNTER — Ambulatory Visit: Payer: Medicare Other | Admitting: Physician Assistant

## 2020-08-22 DIAGNOSIS — J321 Chronic frontal sinusitis: Secondary | ICD-10-CM | POA: Diagnosis not present

## 2020-08-22 DIAGNOSIS — E43 Unspecified severe protein-calorie malnutrition: Secondary | ICD-10-CM | POA: Diagnosis not present

## 2020-08-22 DIAGNOSIS — D631 Anemia in chronic kidney disease: Secondary | ICD-10-CM | POA: Diagnosis not present

## 2020-08-22 DIAGNOSIS — N1831 Chronic kidney disease, stage 3a: Secondary | ICD-10-CM | POA: Diagnosis not present

## 2020-08-22 DIAGNOSIS — F32A Depression, unspecified: Secondary | ICD-10-CM | POA: Diagnosis not present

## 2020-08-22 DIAGNOSIS — S22019D Unspecified fracture of first thoracic vertebra, subsequent encounter for fracture with routine healing: Secondary | ICD-10-CM | POA: Diagnosis not present

## 2020-08-22 DIAGNOSIS — M40209 Unspecified kyphosis, site unspecified: Secondary | ICD-10-CM | POA: Diagnosis not present

## 2020-08-22 DIAGNOSIS — B952 Enterococcus as the cause of diseases classified elsewhere: Secondary | ICD-10-CM | POA: Diagnosis not present

## 2020-08-22 DIAGNOSIS — I130 Hypertensive heart and chronic kidney disease with heart failure and stage 1 through stage 4 chronic kidney disease, or unspecified chronic kidney disease: Secondary | ICD-10-CM | POA: Diagnosis not present

## 2020-08-22 DIAGNOSIS — M4645 Discitis, unspecified, thoracolumbar region: Secondary | ICD-10-CM | POA: Diagnosis not present

## 2020-08-22 DIAGNOSIS — I5043 Acute on chronic combined systolic (congestive) and diastolic (congestive) heart failure: Secondary | ICD-10-CM | POA: Diagnosis not present

## 2020-08-22 DIAGNOSIS — N179 Acute kidney failure, unspecified: Secondary | ICD-10-CM | POA: Diagnosis not present

## 2020-08-22 DIAGNOSIS — E871 Hypo-osmolality and hyponatremia: Secondary | ICD-10-CM | POA: Diagnosis not present

## 2020-08-22 DIAGNOSIS — I517 Cardiomegaly: Secondary | ICD-10-CM | POA: Diagnosis not present

## 2020-08-22 DIAGNOSIS — I2510 Atherosclerotic heart disease of native coronary artery without angina pectoris: Secondary | ICD-10-CM | POA: Diagnosis not present

## 2020-08-22 DIAGNOSIS — S22089D Unspecified fracture of T11-T12 vertebra, subsequent encounter for fracture with routine healing: Secondary | ICD-10-CM | POA: Diagnosis not present

## 2020-08-22 DIAGNOSIS — R7881 Bacteremia: Secondary | ICD-10-CM | POA: Diagnosis not present

## 2020-08-22 DIAGNOSIS — E039 Hypothyroidism, unspecified: Secondary | ICD-10-CM | POA: Diagnosis not present

## 2020-08-22 DIAGNOSIS — I083 Combined rheumatic disorders of mitral, aortic and tricuspid valves: Secondary | ICD-10-CM | POA: Diagnosis not present

## 2020-08-22 DIAGNOSIS — K2940 Chronic atrophic gastritis without bleeding: Secondary | ICD-10-CM | POA: Diagnosis not present

## 2020-08-22 DIAGNOSIS — M5136 Other intervertebral disc degeneration, lumbar region: Secondary | ICD-10-CM | POA: Diagnosis not present

## 2020-08-22 DIAGNOSIS — E739 Lactose intolerance, unspecified: Secondary | ICD-10-CM | POA: Diagnosis not present

## 2020-08-22 DIAGNOSIS — I700 Atherosclerosis of aorta: Secondary | ICD-10-CM | POA: Diagnosis not present

## 2020-08-22 DIAGNOSIS — I4819 Other persistent atrial fibrillation: Secondary | ICD-10-CM | POA: Diagnosis not present

## 2020-08-23 ENCOUNTER — Telehealth: Payer: Self-pay | Admitting: Family Medicine

## 2020-08-23 DIAGNOSIS — S32010D Wedge compression fracture of first lumbar vertebra, subsequent encounter for fracture with routine healing: Secondary | ICD-10-CM | POA: Diagnosis not present

## 2020-08-23 NOTE — Telephone Encounter (Signed)
Verbal orders have been given. Nothing further needed. 

## 2020-08-23 NOTE — Telephone Encounter (Signed)
Remo Lipps physical Therapist form [facility] call and stated he need verbal orders for pt 2 wks 4 and 1 4.Remo Lipps # is [phone].

## 2020-08-23 NOTE — Telephone Encounter (Signed)
Please advise, can I get permission from you to ok these orders or do we needed to wait until Monday when Dr. Elease Hashimoto returns?

## 2020-08-28 DIAGNOSIS — I2510 Atherosclerotic heart disease of native coronary artery without angina pectoris: Secondary | ICD-10-CM | POA: Diagnosis not present

## 2020-08-28 DIAGNOSIS — I083 Combined rheumatic disorders of mitral, aortic and tricuspid valves: Secondary | ICD-10-CM | POA: Diagnosis not present

## 2020-08-28 DIAGNOSIS — F32A Depression, unspecified: Secondary | ICD-10-CM | POA: Diagnosis not present

## 2020-08-28 DIAGNOSIS — J321 Chronic frontal sinusitis: Secondary | ICD-10-CM | POA: Diagnosis not present

## 2020-08-28 DIAGNOSIS — E871 Hypo-osmolality and hyponatremia: Secondary | ICD-10-CM | POA: Diagnosis not present

## 2020-08-28 DIAGNOSIS — I130 Hypertensive heart and chronic kidney disease with heart failure and stage 1 through stage 4 chronic kidney disease, or unspecified chronic kidney disease: Secondary | ICD-10-CM | POA: Diagnosis not present

## 2020-08-28 DIAGNOSIS — K2940 Chronic atrophic gastritis without bleeding: Secondary | ICD-10-CM | POA: Diagnosis not present

## 2020-08-28 DIAGNOSIS — N1831 Chronic kidney disease, stage 3a: Secondary | ICD-10-CM | POA: Diagnosis not present

## 2020-08-28 DIAGNOSIS — E739 Lactose intolerance, unspecified: Secondary | ICD-10-CM | POA: Diagnosis not present

## 2020-08-28 DIAGNOSIS — M4645 Discitis, unspecified, thoracolumbar region: Secondary | ICD-10-CM | POA: Diagnosis not present

## 2020-08-28 DIAGNOSIS — S22019D Unspecified fracture of first thoracic vertebra, subsequent encounter for fracture with routine healing: Secondary | ICD-10-CM | POA: Diagnosis not present

## 2020-08-28 DIAGNOSIS — I5043 Acute on chronic combined systolic (congestive) and diastolic (congestive) heart failure: Secondary | ICD-10-CM | POA: Diagnosis not present

## 2020-08-28 DIAGNOSIS — E43 Unspecified severe protein-calorie malnutrition: Secondary | ICD-10-CM | POA: Diagnosis not present

## 2020-08-28 DIAGNOSIS — I4819 Other persistent atrial fibrillation: Secondary | ICD-10-CM | POA: Diagnosis not present

## 2020-08-28 DIAGNOSIS — B952 Enterococcus as the cause of diseases classified elsewhere: Secondary | ICD-10-CM | POA: Diagnosis not present

## 2020-08-28 DIAGNOSIS — I517 Cardiomegaly: Secondary | ICD-10-CM | POA: Diagnosis not present

## 2020-08-28 DIAGNOSIS — M5136 Other intervertebral disc degeneration, lumbar region: Secondary | ICD-10-CM | POA: Diagnosis not present

## 2020-08-28 DIAGNOSIS — N179 Acute kidney failure, unspecified: Secondary | ICD-10-CM | POA: Diagnosis not present

## 2020-08-28 DIAGNOSIS — D631 Anemia in chronic kidney disease: Secondary | ICD-10-CM | POA: Diagnosis not present

## 2020-08-28 DIAGNOSIS — E039 Hypothyroidism, unspecified: Secondary | ICD-10-CM | POA: Diagnosis not present

## 2020-08-28 DIAGNOSIS — I700 Atherosclerosis of aorta: Secondary | ICD-10-CM | POA: Diagnosis not present

## 2020-08-28 DIAGNOSIS — M40209 Unspecified kyphosis, site unspecified: Secondary | ICD-10-CM | POA: Diagnosis not present

## 2020-08-28 DIAGNOSIS — R7881 Bacteremia: Secondary | ICD-10-CM | POA: Diagnosis not present

## 2020-08-28 DIAGNOSIS — S22089D Unspecified fracture of T11-T12 vertebra, subsequent encounter for fracture with routine healing: Secondary | ICD-10-CM | POA: Diagnosis not present

## 2020-08-30 DIAGNOSIS — E871 Hypo-osmolality and hyponatremia: Secondary | ICD-10-CM | POA: Diagnosis not present

## 2020-08-30 DIAGNOSIS — M4645 Discitis, unspecified, thoracolumbar region: Secondary | ICD-10-CM | POA: Diagnosis not present

## 2020-08-30 DIAGNOSIS — J321 Chronic frontal sinusitis: Secondary | ICD-10-CM | POA: Diagnosis not present

## 2020-08-30 DIAGNOSIS — I700 Atherosclerosis of aorta: Secondary | ICD-10-CM | POA: Diagnosis not present

## 2020-08-30 DIAGNOSIS — E43 Unspecified severe protein-calorie malnutrition: Secondary | ICD-10-CM | POA: Diagnosis not present

## 2020-08-30 DIAGNOSIS — E739 Lactose intolerance, unspecified: Secondary | ICD-10-CM | POA: Diagnosis not present

## 2020-08-30 DIAGNOSIS — S22019D Unspecified fracture of first thoracic vertebra, subsequent encounter for fracture with routine healing: Secondary | ICD-10-CM | POA: Diagnosis not present

## 2020-08-30 DIAGNOSIS — I517 Cardiomegaly: Secondary | ICD-10-CM | POA: Diagnosis not present

## 2020-08-30 DIAGNOSIS — F32A Depression, unspecified: Secondary | ICD-10-CM | POA: Diagnosis not present

## 2020-08-30 DIAGNOSIS — I083 Combined rheumatic disorders of mitral, aortic and tricuspid valves: Secondary | ICD-10-CM | POA: Diagnosis not present

## 2020-08-30 DIAGNOSIS — N1831 Chronic kidney disease, stage 3a: Secondary | ICD-10-CM | POA: Diagnosis not present

## 2020-08-30 DIAGNOSIS — I5043 Acute on chronic combined systolic (congestive) and diastolic (congestive) heart failure: Secondary | ICD-10-CM | POA: Diagnosis not present

## 2020-08-30 DIAGNOSIS — K2940 Chronic atrophic gastritis without bleeding: Secondary | ICD-10-CM | POA: Diagnosis not present

## 2020-08-30 DIAGNOSIS — I130 Hypertensive heart and chronic kidney disease with heart failure and stage 1 through stage 4 chronic kidney disease, or unspecified chronic kidney disease: Secondary | ICD-10-CM | POA: Diagnosis not present

## 2020-08-30 DIAGNOSIS — R7881 Bacteremia: Secondary | ICD-10-CM | POA: Diagnosis not present

## 2020-08-30 DIAGNOSIS — E039 Hypothyroidism, unspecified: Secondary | ICD-10-CM | POA: Diagnosis not present

## 2020-08-30 DIAGNOSIS — M5136 Other intervertebral disc degeneration, lumbar region: Secondary | ICD-10-CM | POA: Diagnosis not present

## 2020-08-30 DIAGNOSIS — D631 Anemia in chronic kidney disease: Secondary | ICD-10-CM | POA: Diagnosis not present

## 2020-08-30 DIAGNOSIS — S22089D Unspecified fracture of T11-T12 vertebra, subsequent encounter for fracture with routine healing: Secondary | ICD-10-CM | POA: Diagnosis not present

## 2020-08-30 DIAGNOSIS — B952 Enterococcus as the cause of diseases classified elsewhere: Secondary | ICD-10-CM | POA: Diagnosis not present

## 2020-08-30 DIAGNOSIS — I4819 Other persistent atrial fibrillation: Secondary | ICD-10-CM | POA: Diagnosis not present

## 2020-08-30 DIAGNOSIS — M40209 Unspecified kyphosis, site unspecified: Secondary | ICD-10-CM | POA: Diagnosis not present

## 2020-08-30 DIAGNOSIS — I2510 Atherosclerotic heart disease of native coronary artery without angina pectoris: Secondary | ICD-10-CM | POA: Diagnosis not present

## 2020-08-30 DIAGNOSIS — N179 Acute kidney failure, unspecified: Secondary | ICD-10-CM | POA: Diagnosis not present

## 2020-08-31 ENCOUNTER — Ambulatory Visit (INDEPENDENT_AMBULATORY_CARE_PROVIDER_SITE_OTHER): Payer: Medicare Other | Admitting: Family Medicine

## 2020-08-31 ENCOUNTER — Other Ambulatory Visit: Payer: Self-pay

## 2020-08-31 ENCOUNTER — Encounter: Payer: Self-pay | Admitting: Family Medicine

## 2020-08-31 VITALS — BP 120/60 | HR 75 | Temp 97.9°F | Wt 93.0 lb

## 2020-08-31 DIAGNOSIS — E039 Hypothyroidism, unspecified: Secondary | ICD-10-CM | POA: Diagnosis not present

## 2020-08-31 DIAGNOSIS — I5041 Acute combined systolic (congestive) and diastolic (congestive) heart failure: Secondary | ICD-10-CM

## 2020-08-31 DIAGNOSIS — E43 Unspecified severe protein-calorie malnutrition: Secondary | ICD-10-CM | POA: Diagnosis not present

## 2020-08-31 DIAGNOSIS — I358 Other nonrheumatic aortic valve disorders: Secondary | ICD-10-CM | POA: Diagnosis not present

## 2020-08-31 LAB — BASIC METABOLIC PANEL
BUN: 25 mg/dL — ABNORMAL HIGH (ref 6–23)
CO2: 25 mEq/L (ref 19–32)
Calcium: 8.6 mg/dL (ref 8.4–10.5)
Chloride: 106 mEq/L (ref 96–112)
Creatinine, Ser: 1.22 mg/dL — ABNORMAL HIGH (ref 0.40–1.20)
GFR: 40.37 mL/min — ABNORMAL LOW (ref >60.00)
Glucose, Bld: 79 mg/dL (ref 70–99)
Potassium: 3.3 mEq/L — ABNORMAL LOW (ref 3.5–5.1)
Sodium: 143 mEq/L (ref 135–145)

## 2020-08-31 LAB — CBC WITH DIFFERENTIAL/PLATELET
Basophils Absolute: 0.1 10*3/uL (ref 0.0–0.1)
Basophils Relative: 1.7 % (ref 0.0–3.0)
Eosinophils Absolute: 0.2 10*3/uL (ref 0.0–0.7)
Eosinophils Relative: 3.7 % (ref 0.0–5.0)
HCT: 28.7 % — ABNORMAL LOW (ref 36.0–46.0)
Hemoglobin: 9.2 g/dL — ABNORMAL LOW (ref 12.0–15.0)
Lymphocytes Relative: 15.1 % (ref 12.0–46.0)
Lymphs Abs: 0.6 10*3/uL — ABNORMAL LOW (ref 0.7–4.0)
MCHC: 32.2 g/dL (ref 30.0–36.0)
MCV: 76.8 fl — ABNORMAL LOW (ref 78.0–100.0)
Monocytes Absolute: 0.5 10*3/uL (ref 0.1–1.0)
Monocytes Relative: 11.2 % (ref 3.0–12.0)
Neutro Abs: 2.9 10*3/uL (ref 1.4–7.7)
Neutrophils Relative %: 68.3 % (ref 43.0–77.0)
Platelets: 179 10*3/uL (ref 150.0–400.0)
RBC: 3.74 Mil/uL — ABNORMAL LOW (ref 3.87–5.11)
RDW: 17.7 % — ABNORMAL HIGH (ref 11.5–15.5)
WBC: 4.2 10*3/uL (ref 4.0–10.5)

## 2020-08-31 LAB — T4, FREE: Free T4: 1.5 ng/dL (ref 0.60–1.60)

## 2020-08-31 LAB — TSH: TSH: 5.65 u[IU]/mL — ABNORMAL HIGH (ref 0.35–4.50)

## 2020-08-31 LAB — C-REACTIVE PROTEIN: CRP: <1 mg/dL (ref 0.5–20.0)

## 2020-08-31 NOTE — Progress Notes (Signed)
Established Patient Office Visit  Subjective:  Patient ID: [name], female    DOB: 03/08/1933  Age: 87 y.o. MRN: [id number]  CC:  Chief Complaint  Patient presents with  . Hospitalization Follow-up    HPI [name] presents for follow-up from recent hospital admissions and recent rehab stay.  She just returned home last week.  She was admitted 9 March and discharged on the 18th.  She had had a fall at home and was found to have T12-L1 fracture with possible discitis.  Further evaluation revealed Enterococcus bacteremia with aortic valve vegetation.  Echocardiogram revealed ejection fraction 40 to 45% with aortic valve vegetation.  Urine culture grew out E. coli.  Blood culture grew out Enterococcus faecalis.  Follow-up blood culture on 3-14 was negative.  Treated with 6 weeks from start date of 3-14 with antibiotics.  She had dual therapy with Rocephin and ampicillin.  She has completed antibiotics at this time and denies any recurrent fever.  She had some generalized weakness but overall feels like she is recovering slowly.  She has home physical therapy in place.  She was readmitted on March 27 after presenting with increased shortness of breath and increased peripheral edema.  She had BNP level 640.  Chest x-ray showed cardiomegaly with bilateral pleural effusions.  Repeat echocardiogram on 3/28 showed ejection fraction 50% with grade 2 diastolic dysfunction.  She diuresed and improved.  Noted her TSH was 65 and her Synthroid dose was increased to 75 mcg daily.  Not clear if she has had any follow-up since then.  She states she is compliant with all of her medications.  She takes her thyroid medication first thing in the morning on empty stomach.  Past Medical History:  Diagnosis Date  . Allergy   . Anemia, pernicious   . Arthritis    bursitis   . Atrophic gastritis   . B12 deficiency   . Bacteremia due to Enterococcus 07/09/2020  . CAD (coronary artery disease)   .  Carotid artery stenosis   . Cataract   . Diarrhea    on and off for months  . Family history of adverse reaction to anesthesia    extreme nausea brother and sister  . Fibromyalgia   . Gallbladder polyp   . GERD (gastroesophageal reflux disease)   . Heart murmur   . History of hiatal hernia   . History of kidney stones   . HLD (hyperlipidemia)   . HTN (hypertension)   . Hypothyroidism   . Irritable bowel syndrome (IBS)   . Median arcuate ligament syndrome ([place])   . Osteoporosis   . PVD (peripheral vascular disease) ([place])   . Tubular adenoma of colon 08/2013   with focal high grade dysplasia    Past Surgical History:  Procedure Laterality Date  . ABDOMINAL HYSTERECTOMY    . APPENDECTOMY    . BIOPSY THYROID    . CARDIOVERSION N/A 04/03/2020   Procedure: CARDIOVERSION;  Surgeon: Dorothy Spark, MD;  Location: [facility] ENDOSCOPY;  Service: Cardiovascular;  Laterality: N/A;  . cataract surgery    . CHOLECYSTECTOMY N/A 10/24/2014   Procedure: MICROSCOPIC  CHOLECYSTECTOMY;  Surgeon: Ralene Ok, MD;  Location: [facility];  Service: General;  Laterality: N/A;  . COLONOSCOPY    . CORONARY ARTERY BYPASS GRAFT  07  . ESOPHAGEAL MANOMETRY N/A 08/02/2012   Procedure: ESOPHAGEAL MANOMETRY (EM);  Surgeon: Sable Feil, MD;  Location: [facility] ENDOSCOPY;  Service: Endoscopy;  Laterality: N/A;  .  ESOPHAGOGASTRODUODENOSCOPY (EGD) WITH PROPOFOL N/A 04/07/2019   Procedure: ESOPHAGOGASTRODUODENOSCOPY (EGD) WITH PROPOFOL;  Surgeon: Milus Banister, MD;  Location: [facility] ENDOSCOPY;  Service: Endoscopy;  Laterality: N/A;  . HEMOSTASIS CLIP PLACEMENT  04/07/2019   Procedure: HEMOSTASIS CLIP PLACEMENT;  Surgeon: Milus Banister, MD;  Location: [facility] ENDOSCOPY;  Service: Endoscopy;;  . HOT HEMOSTASIS N/A 04/07/2019   Procedure: HOT HEMOSTASIS (ARGON PLASMA COAGULATION/BICAP);  Surgeon: Milus Banister, MD;  Location: [facility] ENDOSCOPY;  Service: Endoscopy;  Laterality: N/A;  . POLYPECTOMY  04/07/2019   Procedure:  POLYPECTOMY;  Surgeon: Milus Banister, MD;  Location: [facility] ENDOSCOPY;  Service: Endoscopy;;  . [name]  04/07/2019   Procedure: [name];  Surgeon: Milus Banister, MD;  Location: [facility] ENDOSCOPY;  Service: Endoscopy;;  . TEE WITHOUT CARDIOVERSION N/A 04/03/2020   Procedure: TRANSESOPHAGEAL ECHOCARDIOGRAM (TEE);  Surgeon: Dorothy Spark, MD;  Location: [facility] ENDOSCOPY;  Service: Cardiovascular;  Laterality: N/A;  . THYROID LOBECTOMY    . UPPER GASTROINTESTINAL ENDOSCOPY      Family History  Problem Relation Age of Onset  . Heart disease Mother   . Cholelithiasis Mother   . Prostate cancer Brother   . Colon cancer Brother   . Breast cancer Other        niece - breast cancer  . Diabetes Sister   . Lung cancer Father   . Lung cancer Sister   . Stomach cancer Cousin   . Esophageal cancer Neg Hx   . Rectal cancer Neg Hx     Social History   Socioeconomic History  . Marital status: Married    Spouse name: [name]  . Number of children: 0  . Years of education: 2  . Highest education level: Not on file  Occupational History  . Occupation: retired    Fish farm manager: RETIRED  Tobacco Use  . Smoking status: Never Smoker  . Smokeless tobacco: Never Used  Vaping Use  . Vaping Use: Never used  Substance and Sexual Activity  . Alcohol use: No    Alcohol/week: 0.0 standard drinks  . Drug use: No  . Sexual activity: Not on file  Other Topics Concern  . Not on file  Social History Narrative   [name], Married. Lives with husband. Had been a very active woman.   Social Determinants of Health   Financial Resource Strain: Low Risk   . Difficulty of Paying Living Expenses: Not hard at all  Food Insecurity: Not on file  Transportation Needs: No Transportation Needs  . Lack of Transportation (Medical): No  . Lack of Transportation (Non-Medical): No  Physical Activity: Not on file  Stress: Not on file  Social Connections: Not on file  Intimate Partner Violence: Not on file     Outpatient Medications Prior to Visit  Medication Sig Dispense Refill  . acetaminophen (TYLENOL) 500 MG tablet Take 2 tablets (1,000 mg total) by mouth every 8 (eight) hours as needed for mild pain. 30 tablet 0  . atorvastatin (LIPITOR) 80 MG tablet Take 0.5 tablets (40 mg total) by mouth at bedtime. 45 tablet 0  . Calcium Carbonate-Vitamin D (CALTRATE 600+D PO) Take 1 tablet by mouth daily with lunch.     Marland Kitchen ELIQUIS 2.5 MG TABS tablet TAKE 1 TABLET BY MOUTH TWICE DAILY (Patient taking differently: Take 2.5 mg by mouth 2 (two) times daily.) 180 tablet 1  . furosemide (LASIX) 20 MG tablet Take 3 tablets (60 mg total) by mouth daily.    Marland Kitchen gabapentin (NEURONTIN) 100 MG capsule Take  1 capsule (100 mg total) by mouth 2 (two) times daily. 60 capsule 1  . hyoscyamine (LEVSIN SL) 0.125 MG SL tablet Place 1 tablet (0.125 mg total) under the tongue every 6 (six) hours as needed. (Patient taking differently: Place 0.125 mg under the tongue every 6 (six) hours as needed for cramping.) 30 tablet 2  . Infant Care Products [facility]) OINT Apply 1 application topically in the morning, at noon, and at bedtime.    Marland Kitchen ipratropium (ATROVENT) 0.03 % nasal spray Place 2 sprays into both nostrils every 12 (twelve) hours.    Marland Kitchen lactase (LACTAID) 3000 units tablet Take 3,000 Units by mouth every 6 (six) hours as needed (lactose Intolerance).    . lactose free nutrition (BOOST PLUS) LIQD Take 237 mLs by mouth 3 (three) times daily with meals.  0  . levothyroxine (SYNTHROID) 75 MCG tablet Take 1 tablet (75 mcg total) by mouth daily at 6 (six) AM.    . Lidocaine 4 % PTCH Place 1 patch onto the skin daily as needed (pain.).     Marland Kitchen Lidocaine-Menthol (LIDOCAINE-MENTHOL ROLL-ON) 4-1 % LIQD Apply 1 application topically every 8 (eight) hours as needed (pain).    . Multiple Vitamins-Minerals (PRESERVISION AREDS 2 PO) Take 1 tablet by mouth 2 (two) times daily.    . nitroGLYCERIN (NITROSTAT) 0.4 MG SL tablet Place 0.4 mg under  the tongue every 5 (five) minutes as needed for chest pain.    Marland Kitchen oxyCODONE (OXY IR/ROXICODONE) 5 MG immediate release tablet Take 1 tablet (5 mg total) by mouth every 6 (six) hours as needed for severe pain. 20 tablet 0  . Polyethyl Glycol-Propyl Glycol (SYSTANE FREE OP) Place 1 drop into both eyes every 8 (eight) hours as needed (dry eyes).    . polyethylene glycol (MIRALAX / GLYCOLAX) 17 g packet Take 17 g by mouth daily. 14 each 0  . saccharomyces boulardii (FLORASTOR) 250 MG capsule Take 250 mg by mouth daily as needed (digestion).    . sennosides-docusate sodium (SENOKOT-S) 8.6-50 MG tablet Take 1 tablet by mouth daily.    . sodium chloride (OCEAN) 0.65 % SOLN nasal spray Place 1 spray into both nostrils daily as needed for congestion.    . sucralfate (CARAFATE) 1 GM/10ML suspension TAKE 10 MLS (1 G TOTAL) BY MOUTH 2 (TWO) TIMES DAILY. (Patient taking differently: Take 1 g by mouth 2 (two) times daily.) 420 mL 1  . traZODone (DESYREL) 50 MG tablet Take 25 mg by mouth at bedtime.    Marland Kitchen ampicillin IVPB Inject 2 g into the vein every 6 (six) hours. Indication:  Endocarditis, discitis First Dose: No Last Day of Therapy:  08/20/20 Labs - Once weekly:  CBC/D and BMP, Labs - Every other week:  ESR and CRP Method of administration: Ambulatory Pump (Continuous Infusion) Method of administration may be changed at the discretion of home infusion pharmacist based upon assessment of the patient and/or caregiver's ability to self-administer the medication ordered. 100 Units 0  . cefTRIAXone (ROCEPHIN) IVPB Inject 2 g into the vein every 12 (twelve) hours. Indication:  Endocarditis/discitis First Dose: No Last Day of Therapy:  08/20/20 Labs - Once weekly:  CBC/D and BMP, Labs - Every other week:  ESR and CRP Method of administration: IV Push Method of administration may be changed at the discretion of home infusion pharmacist based upon assessment of the patient and/or caregiver's ability to self-administer  the medication ordered. 50 Units 0   No facility-administered medications prior to visit.  Allergies  Allergen Reactions  . Actos [Pioglitazone] Swelling    Ankle swelling  . Sulfonamide Derivatives Swelling  . Adhesive [Tape] Other (See Comments)    WITH PROLONGED EXPOSURE (REDNESS/ITCHING)  . Codeine Nausea And Vomiting    ROS Review of Systems  Constitutional: Negative for chills and fever.  Respiratory: Negative for cough.   Cardiovascular: Negative for chest pain and palpitations.  Gastrointestinal: Negative for abdominal pain.  Genitourinary: Negative for dysuria.  Neurological: Negative for dizziness.      Objective:    Physical Exam Vitals reviewed.  Cardiovascular:     Rate and Rhythm: Normal rate.  Pulmonary:     Effort: Pulmonary effort is normal.     Breath sounds: Normal breath sounds.  Musculoskeletal:     Cervical back: Neck supple.     Comments: She has trace pitting edema lower legs bilaterally but she states overall this is much improved  Lymphadenopathy:     Cervical: No cervical adenopathy.  Neurological:     General: No focal deficit present.     BP 120/60 (BP Location: Left Arm, Patient Position: Sitting, Cuff Size: Normal)   Pulse 75   Temp 97.9 F (36.6 C) (Oral)   Wt 93 lb (42.2 kg)   SpO2 97%   BMI 16.47 kg/m  Wt Readings from Last 3 Encounters:  08/31/20 93 lb (42.2 kg)  07/28/20 96 lb 5.5 oz (43.7 kg)  07/09/20 95 lb 10.9 oz (43.4 kg)     Health Maintenance Due  Topic Date Due  . TETANUS/TDAP  Never done    There are no preventive care reminders to display for this patient.  Lab Results  Component Value Date   TSH 60.606 (H) 07/23/2020   Lab Results  Component Value Date   WBC 6.7 07/26/2020   HGB 8.3 (L) 07/27/2020   HCT 25.4 (L) 07/27/2020   MCV 85.1 07/26/2020   PLT 184 07/26/2020   Lab Results  Component Value Date   NA 133 (L) 07/28/2020   K 3.8 07/28/2020   CO2 31 07/28/2020   GLUCOSE 72 07/28/2020    BUN 10 07/28/2020   CREATININE 0.89 07/28/2020   BILITOT 0.5 07/22/2020   ALKPHOS 78 07/22/2020   AST 36 07/22/2020   ALT 32 07/22/2020   PROT 5.5 (L) 07/22/2020   ALBUMIN 1.8 (L) 07/24/2020   CALCIUM 7.7 (L) 07/28/2020   ANIONGAP 5 07/28/2020   GFR 63.57 10/27/2019   Lab Results  Component Value Date   CHOL 131 05/23/2020   Lab Results  Component Value Date   HDL 64 05/23/2020   Lab Results  Component Value Date   LDLCALC 47 05/23/2020   Lab Results  Component Value Date   TRIG 115 05/23/2020   Lab Results  Component Value Date   CHOLHDL 2.0 05/23/2020   Lab Results  Component Value Date   HGBA1C 5.8 11/18/2016      Assessment & Plan:   #1 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 -Continue close follow-up with ID -Recheck labs with basic metabolic panel, CBC, CRP  #2 hypothyroidism.  Recent TSH of 65 -Recheck TSH and free T4  #3 protein calorie malnutrition.  BMI less than 17. -Consider calorie and nutritional supplement such as Ensure or Boost  #4 generalized weakness -Continue home physical therapy  #5 combined systolic/diastolic heart failure.  Currently weight seems to be stable.  Only minimal peripheral  edema. -Continue Lasix -Recheck basic metabolic panel -Keep sodium intake less than 2400 mg daily  No orders of the defined types were placed in this encounter.   Follow-up: Return in about 3 months (around 12/01/2020).    Carolann Littler, MD

## 2020-08-31 NOTE — Patient Instructions (Signed)
Endocarditis  Endocarditis is an infection of the heart valves or an infection of the inner layer of the heart (endocardium). Endocarditis can cause growths on the heart valves or inside the heart. Over time, these growths can destroy heart tissue and cause heart failure or problems with the heart rhythm. They can also cause a stroke if they break away and block an artery in the brain. Early treatment offers the best chance for curing endocarditis and preventing complications. What are the causes? This condition may be caused by:  Germs that normally live in or on your body. The germs that most commonly cause endocarditis are bacteria.  Fungus.  Cancer.  Connective tissue disease. What increases the risk? This condition is more likely to develop in people who have:  A heart defect.  Artificial (prosthetic) heart valves.  An abnormal or damaged heart valve.  A history of endocarditis.  IV drug abuse. Having certain procedures may also increase the risk of germs getting into the heart or bloodstream. What are the signs or symptoms? Symptoms of this condition may start suddenly, or they may start slowly and gradually get worse. Symptoms include:  Fever.  Chills.  Night sweats.  Muscle aches.  Fatigue.  Weakness.  Shortness of breath.  Chest pain.  Blood spots in the eyes.  Bleeding under the fingernails or toenails.  Painless red spots on the palms.  Painful lumps in the fingertips or toes.  Swelling in the feet or ankles. How is this diagnosed? This condition may be diagnosed based on:  A physical exam. Your health care provider will listen to your heart to check for abnormal heart sounds (murmur). He or she may also use a scope to check for bleeding at the back of your eyes (retinas).  Tests. They may include: ? Blood tests to look for the germs that cause endocarditis. ? Imaging tests. These include a chest X-ray, CT scan, or echocardiogram. A type of  echocardiogram called a transesophageal echocardiogram may be done to look at heart valves more closely. How is this treated? Treatment for this condition depends on the cause of the endocarditis. Treatment may include:  Antibiotic medicines. These may be given through an IV line or taken by mouth. You may need to be on more than one antibiotic medicine.  Surgery to replace your heart valve. You may need surgery if: ? The endocarditis does not respond to treatment. ? You develop complications. ? Your heart valve is severely damaged. Follow these instructions at home: Medicines  Take over-the-counter and prescription medicines only as told by your health care provider.  If you were prescribed an antibiotic medicine, take it as told by your health care provider. Do not stop taking the antibiotic even if you start to feel better. You may need to be on intravenous or oral antibiotics for several weeks.  Do not use IV drugs unless it is part of your medical treatment. Lifestyle  Do not get tattoos or body piercings.  Practice good oral hygiene. This includes: ? Brushing and flossing regularly. ? Scheduling routine dental appointments.  Do not use any products that contain nicotine or tobacco, such as cigarettes, e-cigarettes, and chewing tobacco. If you need help quitting, ask your health care provider.  If you drink alcohol: ? Limit how much you use to:  0-1 drink a day for women.  0-2 drinks a day for men. ? Be aware of how much alcohol is in your drink. In the [place] one drink equals one  typical bottle of beer (12 oz), one-half glass of wine (5 oz), or one shot of hard liquor (1½ oz). °General instructions °· Let your health care provider know before you have any dental or surgical procedures. You may need to take antibiotics before the procedure. °· Tell all of your health care providers, including your dentist, that you have had endocarditis. °· Gradually resume your usual  activities. °· Keep all follow-up visits as told by your health care provider. This is important. °Contact a health care provider if: °· You have a fever. °· Your symptoms do not improve. °· Your symptoms get worse. °· Your symptoms come back. °Get help right away if: °· You have trouble breathing. °· You have chest pain. °· You have any symptoms of a stroke. "BE FAST" is an easy way to remember the main warning signs of a stroke: °? B - Balance. Signs are dizziness, sudden trouble walking, or loss of balance. °? E - Eyes. Signs are trouble seeing or a sudden change in vision. °? F - Face. Signs are sudden weakness or numbness of the face, or the face or eyelid drooping on one side. °? A - Arms. Signs are weakness or numbness in an arm. This happens suddenly and usually on one side of the body. °? S - Speech. Signs are sudden trouble speaking, slurred speech, or trouble understanding what people say. °? T - Time. Time to call emergency services. Write down what time symptoms started. °· You have other signs of a stroke, such as: °? A sudden, severe headache with no known cause. °? Nausea or vomiting. °? Seizure. °These symptoms may represent a serious problem that is an emergency. Do not wait to see if the symptoms will go away. Get medical help right away. Call your local emergency services (911 in the [place] Do not drive yourself to the hospital. °Summary °· Endocarditis is an infection of the heart valves or inner layer of the heart (endocardium). It is caused by bacteria or a fungus. °· Having certain heart conditions or procedures may increase the risk of endocarditis. °· Antibiotics are an important treatment for endocarditis. Take these medicines as told by your health care provider. Do not stop taking them even if you start to feel better. °· Tell all of your health care providers, including your dentist, that you have had endocarditis. °This information is not intended to replace advice given to you by your  health care provider. Make sure you discuss any questions you have with your health care provider. °Document Revised: 11/30/2017 Document Reviewed: 11/30/2017 °Elsevier Patient Education © 2021 [employer] ° °

## 2020-09-03 ENCOUNTER — Ambulatory Visit: Payer: Medicare Other | Admitting: Infectious Disease

## 2020-09-04 DIAGNOSIS — M5136 Other intervertebral disc degeneration, lumbar region: Secondary | ICD-10-CM | POA: Diagnosis not present

## 2020-09-04 DIAGNOSIS — E739 Lactose intolerance, unspecified: Secondary | ICD-10-CM | POA: Diagnosis not present

## 2020-09-04 DIAGNOSIS — J321 Chronic frontal sinusitis: Secondary | ICD-10-CM | POA: Diagnosis not present

## 2020-09-04 DIAGNOSIS — I2510 Atherosclerotic heart disease of native coronary artery without angina pectoris: Secondary | ICD-10-CM | POA: Diagnosis not present

## 2020-09-04 DIAGNOSIS — S22089D Unspecified fracture of T11-T12 vertebra, subsequent encounter for fracture with routine healing: Secondary | ICD-10-CM | POA: Diagnosis not present

## 2020-09-04 DIAGNOSIS — E039 Hypothyroidism, unspecified: Secondary | ICD-10-CM | POA: Diagnosis not present

## 2020-09-04 DIAGNOSIS — I4819 Other persistent atrial fibrillation: Secondary | ICD-10-CM | POA: Diagnosis not present

## 2020-09-04 DIAGNOSIS — E43 Unspecified severe protein-calorie malnutrition: Secondary | ICD-10-CM | POA: Diagnosis not present

## 2020-09-04 DIAGNOSIS — M4645 Discitis, unspecified, thoracolumbar region: Secondary | ICD-10-CM | POA: Diagnosis not present

## 2020-09-04 DIAGNOSIS — I083 Combined rheumatic disorders of mitral, aortic and tricuspid valves: Secondary | ICD-10-CM | POA: Diagnosis not present

## 2020-09-04 DIAGNOSIS — N1831 Chronic kidney disease, stage 3a: Secondary | ICD-10-CM | POA: Diagnosis not present

## 2020-09-04 DIAGNOSIS — F32A Depression, unspecified: Secondary | ICD-10-CM | POA: Diagnosis not present

## 2020-09-04 DIAGNOSIS — E871 Hypo-osmolality and hyponatremia: Secondary | ICD-10-CM | POA: Diagnosis not present

## 2020-09-04 DIAGNOSIS — K2940 Chronic atrophic gastritis without bleeding: Secondary | ICD-10-CM | POA: Diagnosis not present

## 2020-09-04 DIAGNOSIS — N179 Acute kidney failure, unspecified: Secondary | ICD-10-CM | POA: Diagnosis not present

## 2020-09-04 DIAGNOSIS — I700 Atherosclerosis of aorta: Secondary | ICD-10-CM | POA: Diagnosis not present

## 2020-09-04 DIAGNOSIS — D631 Anemia in chronic kidney disease: Secondary | ICD-10-CM | POA: Diagnosis not present

## 2020-09-04 DIAGNOSIS — I517 Cardiomegaly: Secondary | ICD-10-CM | POA: Diagnosis not present

## 2020-09-04 DIAGNOSIS — R7881 Bacteremia: Secondary | ICD-10-CM | POA: Diagnosis not present

## 2020-09-04 DIAGNOSIS — I130 Hypertensive heart and chronic kidney disease with heart failure and stage 1 through stage 4 chronic kidney disease, or unspecified chronic kidney disease: Secondary | ICD-10-CM | POA: Diagnosis not present

## 2020-09-04 DIAGNOSIS — M40209 Unspecified kyphosis, site unspecified: Secondary | ICD-10-CM | POA: Diagnosis not present

## 2020-09-04 DIAGNOSIS — I5043 Acute on chronic combined systolic (congestive) and diastolic (congestive) heart failure: Secondary | ICD-10-CM | POA: Diagnosis not present

## 2020-09-04 DIAGNOSIS — B952 Enterococcus as the cause of diseases classified elsewhere: Secondary | ICD-10-CM | POA: Diagnosis not present

## 2020-09-04 DIAGNOSIS — S22019D Unspecified fracture of first thoracic vertebra, subsequent encounter for fracture with routine healing: Secondary | ICD-10-CM | POA: Diagnosis not present

## 2020-09-06 ENCOUNTER — Encounter: Payer: Self-pay | Admitting: Infectious Disease

## 2020-09-06 ENCOUNTER — Other Ambulatory Visit: Payer: Self-pay

## 2020-09-06 ENCOUNTER — Ambulatory Visit (INDEPENDENT_AMBULATORY_CARE_PROVIDER_SITE_OTHER): Payer: Medicare Other | Admitting: Infectious Disease

## 2020-09-06 VITALS — BP 165/66 | HR 69 | Temp 98.2°F | Wt 89.0 lb

## 2020-09-06 DIAGNOSIS — A498 Other bacterial infections of unspecified site: Secondary | ICD-10-CM | POA: Insufficient documentation

## 2020-09-06 DIAGNOSIS — I059 Rheumatic mitral valve disease, unspecified: Secondary | ICD-10-CM

## 2020-09-06 DIAGNOSIS — M4645 Discitis, unspecified, thoracolumbar region: Secondary | ICD-10-CM | POA: Diagnosis not present

## 2020-09-06 DIAGNOSIS — B952 Enterococcus as the cause of diseases classified elsewhere: Secondary | ICD-10-CM | POA: Diagnosis not present

## 2020-09-06 HISTORY — DX: Other bacterial infections of unspecified site: A49.8

## 2020-09-06 HISTORY — DX: Enterococcus as the cause of diseases classified elsewhere: B95.2

## 2020-09-06 NOTE — Progress Notes (Signed)
Subjective:   Chief complaint: exhausted w dealing with multiple stressors in her life    Patient ID: [name], female    DOB: 04-Sep-1932, 87 y.o.   MRN: [id number]  HPI  ICELYN Valdez is a 87 y.o. female with recent syncopal episode fall low back pain with MRI concerning for discitis.  In the interim her blood cultures have turned positive for Enterococcus faecalis and a transthoracic echocardiogram that orders have showed a large vegetation on the mitral valve.  She was not deemed an operative candidate by CT surgery.  She has subsequently received 6 weeks of dual beta lactam therapy and PICC has been removed.  Her back pain while present has improved and not worsened in the 10+ days after stopping antibiotics. There have been several deaths in the family and patient had to cancel appointments earlier in the week and had to get her husband to his coumadin clinic today.   Past Medical History:  Diagnosis Date  . Allergy   . Anemia, pernicious   . Arthritis    bursitis   . Atrophic gastritis   . B12 deficiency   . Bacteremia due to Enterococcus 07/09/2020  . CAD (coronary artery disease)   . Carotid artery stenosis   . Cataract   . Diarrhea    on and off for months  . Enterococcus faecalis infection 09/06/2020  . Family history of adverse reaction to anesthesia    extreme nausea brother and sister  . Fibromyalgia   . Gallbladder polyp   . GERD (gastroesophageal reflux disease)   . Heart murmur   . History of hiatal hernia   . History of kidney stones   . HLD (hyperlipidemia)   . HTN (hypertension)   . Hypothyroidism   . Irritable bowel syndrome (IBS)   . Median arcuate ligament syndrome ([place])   . Osteoporosis   . PVD (peripheral vascular disease) ([place])   . Tubular adenoma of colon 08/2013   with focal high grade dysplasia    Past Surgical History:  Procedure Laterality Date  . ABDOMINAL HYSTERECTOMY    . APPENDECTOMY    . BIOPSY THYROID    .  CARDIOVERSION N/A 04/03/2020   Procedure: CARDIOVERSION;  Surgeon: Dorothy Spark, MD;  Location: [facility] ENDOSCOPY;  Service: Cardiovascular;  Laterality: N/A;  . cataract surgery    . CHOLECYSTECTOMY N/A 10/24/2014   Procedure: MICROSCOPIC  CHOLECYSTECTOMY;  Surgeon: Ralene Ok, MD;  Location: [place];  Service: General;  Laterality: N/A;  . COLONOSCOPY    . CORONARY ARTERY BYPASS GRAFT  07  . ESOPHAGEAL MANOMETRY N/A 08/02/2012   Procedure: ESOPHAGEAL MANOMETRY (EM);  Surgeon: Sable Feil, MD;  Location: [facility] ENDOSCOPY;  Service: Endoscopy;  Laterality: N/A;  . ESOPHAGOGASTRODUODENOSCOPY (EGD) WITH PROPOFOL N/A 04/07/2019   Procedure: ESOPHAGOGASTRODUODENOSCOPY (EGD) WITH PROPOFOL;  Surgeon: Milus Banister, MD;  Location: [facility] ENDOSCOPY;  Service: Endoscopy;  Laterality: N/A;  . HEMOSTASIS CLIP PLACEMENT  04/07/2019   Procedure: HEMOSTASIS CLIP PLACEMENT;  Surgeon: Milus Banister, MD;  Location: [facility] ENDOSCOPY;  Service: Endoscopy;;  . HOT HEMOSTASIS N/A 04/07/2019   Procedure: HOT HEMOSTASIS (ARGON PLASMA COAGULATION/BICAP);  Surgeon: Milus Banister, MD;  Location: [facility] ENDOSCOPY;  Service: Endoscopy;  Laterality: N/A;  . POLYPECTOMY  04/07/2019   Procedure: POLYPECTOMY;  Surgeon: Milus Banister, MD;  Location: [facility] ENDOSCOPY;  Service: Endoscopy;;  . [name]  04/07/2019   Procedure: [name];  Surgeon: Milus Banister, MD;  Location: [facility] ENDOSCOPY;  Service: Endoscopy;;  . TEE WITHOUT CARDIOVERSION N/A 04/03/2020   Procedure: TRANSESOPHAGEAL ECHOCARDIOGRAM (TEE);  Surgeon: Dorothy Spark, MD;  Location: [facility] ENDOSCOPY;  Service: Cardiovascular;  Laterality: N/A;  . THYROID LOBECTOMY    . UPPER GASTROINTESTINAL ENDOSCOPY      Family History  Problem Relation Age of Onset  . Heart disease Mother   . Cholelithiasis Mother   . Prostate cancer Brother   . Colon cancer Brother   . Breast cancer Other        niece - breast cancer  . Diabetes Sister   . Lung cancer Father    . Lung cancer Sister   . Stomach cancer Cousin   . Esophageal cancer Neg Hx   . Rectal cancer Neg Hx       Social History   Socioeconomic History  . Marital status: Married    Spouse name: [name]  . Number of children: 0  . Years of education: 1  . Highest education level: Not on file  Occupational History  . Occupation: retired    Fish farm manager: RETIRED  Tobacco Use  . Smoking status: Never Smoker  . Smokeless tobacco: Never Used  Vaping Use  . Vaping Use: Never used  Substance and Sexual Activity  . Alcohol use: No    Alcohol/week: 0.0 standard drinks  . Drug use: No  . Sexual activity: Not on file  Other Topics Concern  . Not on file  Social History Narrative   [name], Married. Lives with husband. Had been a very active woman.   Social Determinants of Health   Financial Resource Strain: Low Risk   . Difficulty of Paying Living Expenses: Not hard at all  Food Insecurity: Not on file  Transportation Needs: No Transportation Needs  . Lack of Transportation (Medical): No  . Lack of Transportation (Non-Medical): No  Physical Activity: Not on file  Stress: Not on file  Social Connections: Not on file    Allergies  Allergen Reactions  . Actos [Pioglitazone] Swelling    Ankle swelling  . Sulfonamide Derivatives Swelling  . Adhesive [Tape] Other (See Comments)    WITH PROLONGED EXPOSURE (REDNESS/ITCHING)  . Codeine Nausea And Vomiting     Current Outpatient Medications:  .  acetaminophen (TYLENOL) 500 MG tablet, Take 2 tablets (1,000 mg total) by mouth every 8 (eight) hours as needed for mild pain., Disp: 30 tablet, Rfl: 0 .  atorvastatin (LIPITOR) 80 MG tablet, Take 0.5 tablets (40 mg total) by mouth at bedtime., Disp: 45 tablet, Rfl: 0 .  Calcium Carbonate-Vitamin D (CALTRATE 600+D PO), Take 1 tablet by mouth daily with lunch. , Disp: , Rfl:  .  ELIQUIS 2.5 MG TABS tablet, TAKE 1 TABLET BY MOUTH TWICE DAILY (Patient taking differently: Take 2.5 mg by mouth 2 (two)  times daily.), Disp: 180 tablet, Rfl: 1 .  furosemide (LASIX) 20 MG tablet, Take 3 tablets (60 mg total) by mouth daily., Disp: , Rfl:  .  gabapentin (NEURONTIN) 100 MG capsule, Take 1 capsule (100 mg total) by mouth 2 (two) times daily., Disp: 60 capsule, Rfl: 1 .  hyoscyamine (LEVSIN SL) 0.125 MG SL tablet, Place 1 tablet (0.125 mg total) under the tongue every 6 (six) hours as needed. (Patient taking differently: Place 0.125 mg under the tongue every 6 (six) hours as needed for cramping.), Disp: 30 tablet, Rfl: 2 .  Infant Care Products (DERMACLOUD) OINT, Apply 1 application topically in the morning, at noon, and at bedtime., Disp: , Rfl:  .  ipratropium (ATROVENT) 0.03 % nasal spray, Place 2 sprays into both nostrils every 12 (twelve) hours., Disp: , Rfl:  .  lactase (LACTAID) 3000 units tablet, Take 3,000 Units by mouth every 6 (six) hours as needed (lactose Intolerance)., Disp: , Rfl:  .  lactose free nutrition (BOOST PLUS) LIQD, Take 237 mLs by mouth 3 (three) times daily with meals., Disp: , Rfl: 0 .  levothyroxine (SYNTHROID) 75 MCG tablet, Take 1 tablet (75 mcg total) by mouth daily at 6 (six) AM., Disp: , Rfl:  .  Lidocaine 4 % PTCH, Place 1 patch onto the skin daily as needed (pain.). , Disp: , Rfl:  .  Lidocaine-Menthol (LIDOCAINE-MENTHOL ROLL-ON) 4-1 % LIQD, Apply 1 application topically every 8 (eight) hours as needed (pain)., Disp: , Rfl:  .  Multiple Vitamins-Minerals (PRESERVISION AREDS 2 PO), Take 1 tablet by mouth 2 (two) times daily., Disp: , Rfl:  .  nitroGLYCERIN (NITROSTAT) 0.4 MG SL tablet, Place 0.4 mg under the tongue every 5 (five) minutes as needed for chest pain., Disp: , Rfl:  .  oxyCODONE (OXY IR/ROXICODONE) 5 MG immediate release tablet, Take 1 tablet (5 mg total) by mouth every 6 (six) hours as needed for severe pain., Disp: 20 tablet, Rfl: 0 .  Polyethyl Glycol-Propyl Glycol (SYSTANE FREE OP), Place 1 drop into both eyes every 8 (eight) hours as needed (dry eyes).,  Disp: , Rfl:  .  polyethylene glycol (MIRALAX / GLYCOLAX) 17 g packet, Take 17 g by mouth daily., Disp: 14 each, Rfl: 0 .  saccharomyces boulardii (FLORASTOR) 250 MG capsule, Take 250 mg by mouth daily as needed (digestion)., Disp: , Rfl:  .  sennosides-docusate sodium (SENOKOT-S) 8.6-50 MG tablet, Take 1 tablet by mouth daily., Disp: , Rfl:  .  sodium chloride (OCEAN) 0.65 % SOLN nasal spray, Place 1 spray into both nostrils daily as needed for congestion., Disp: , Rfl:  .  sucralfate (CARAFATE) 1 GM/10ML suspension, TAKE 10 MLS (1 G TOTAL) BY MOUTH 2 (TWO) TIMES DAILY. (Patient taking differently: Take 1 g by mouth 2 (two) times daily.), Disp: 420 mL, Rfl: 1 .  traZODone (DESYREL) 50 MG tablet, Take 25 mg by mouth at bedtime., Disp: , Rfl:    Review of Systems  Constitutional: Positive for fatigue. Negative for activity change, appetite change, chills, diaphoresis, fever and unexpected weight change.  HENT: Negative for congestion, rhinorrhea, sinus pressure, sneezing, sore throat and trouble swallowing.   Eyes: Negative for photophobia and visual disturbance.  Respiratory: Negative for cough, chest tightness, shortness of breath, wheezing and stridor.   Cardiovascular: Negative for chest pain, palpitations and leg swelling.  Gastrointestinal: Negative for abdominal distention, abdominal pain, anal bleeding, blood in stool, constipation, diarrhea, nausea and vomiting.  Genitourinary: Negative for difficulty urinating, dysuria, flank pain and hematuria.  Musculoskeletal: Negative for arthralgias, back pain, gait problem, joint swelling and myalgias.  Skin: Negative for color change, pallor, rash and wound.  Neurological: Negative for dizziness, tremors, weakness and light-headedness.  Hematological: Negative for adenopathy. Does not bruise/bleed easily.  Psychiatric/Behavioral: Positive for dysphoric mood. Negative for agitation, behavioral problems, confusion, decreased concentration and sleep  disturbance.       Objective:   Physical Exam Constitutional:      General: She is not in acute distress.    Appearance: Normal appearance. She is well-developed. She is not ill-appearing or diaphoretic.  HENT:     Head: Normocephalic and atraumatic.     Right Ear: Hearing and external ear normal.  Left Ear: Hearing and external ear normal.     Nose: No nasal deformity or rhinorrhea.  Eyes:     General: No scleral icterus.    Conjunctiva/sclera: Conjunctivae normal.     Right eye: Right conjunctiva is not injected.     Left eye: Left conjunctiva is not injected.     Pupils: Pupils are equal, round, and reactive to light.  Neck:     Vascular: No JVD.  Cardiovascular:     Rate and Rhythm: Normal rate and regular rhythm.     Heart sounds: S1 normal and S2 normal. Murmur heard.  No friction rub.  Pulmonary:     Effort: Pulmonary effort is normal. No respiratory distress.     Breath sounds: No stridor. No wheezing or rhonchi.  Abdominal:     General: Bowel sounds are normal. There is no distension.     Palpations: Abdomen is soft.     Tenderness: There is no abdominal tenderness.  Musculoskeletal:        General: Normal range of motion.     Right shoulder: Normal.     Left shoulder: Normal.     Cervical back: Normal range of motion and neck supple.     Right hip: Normal.     Left hip: Normal.     Right knee: Normal.     Left knee: Normal.  Lymphadenopathy:     Head:     Right side of head: No submandibular, preauricular or posterior auricular adenopathy.     Left side of head: No submandibular, preauricular or posterior auricular adenopathy.     Cervical: No cervical adenopathy.     Right cervical: No superficial or deep cervical adenopathy.    Left cervical: No superficial or deep cervical adenopathy.  Skin:    General: Skin is warm and dry.     Coloration: Skin is not pale.     Findings: No abrasion, bruising, ecchymosis, erythema, lesion or rash.     Nails: There  is no clubbing.  Neurological:     Mental Status: She is alert and oriented to person, place, and time.     Sensory: No sensory deficit.     Coordination: Coordination normal.     Gait: Gait normal.  Psychiatric:        Attention and Perception: She is attentive.        Mood and Affect: Mood is depressed.        Speech: Speech normal.        Behavior: Behavior normal. Behavior is cooperative.        Thought Content: Thought content normal.        Cognition and Memory: Cognition normal.        Judgment: Judgment normal.           Assessment & Plan:  E faecalis MV endocarditis with diskiits  She has responded to therapy  I will check surveillance blood cultures  Check CBC w diff, CMP w GFR ESR , CRP  We will observe off antibiotics and return to clinic in 2 months  I spent greater than 30 minutes with the patient including greater than 50% of time in face to face counsel of the patient and in coordination of her care.

## 2020-09-07 ENCOUNTER — Telehealth: Payer: Self-pay

## 2020-09-07 DIAGNOSIS — J321 Chronic frontal sinusitis: Secondary | ICD-10-CM | POA: Diagnosis not present

## 2020-09-07 DIAGNOSIS — M40209 Unspecified kyphosis, site unspecified: Secondary | ICD-10-CM | POA: Diagnosis not present

## 2020-09-07 DIAGNOSIS — S22089D Unspecified fracture of T11-T12 vertebra, subsequent encounter for fracture with routine healing: Secondary | ICD-10-CM | POA: Diagnosis not present

## 2020-09-07 DIAGNOSIS — I5043 Acute on chronic combined systolic (congestive) and diastolic (congestive) heart failure: Secondary | ICD-10-CM | POA: Diagnosis not present

## 2020-09-07 DIAGNOSIS — D631 Anemia in chronic kidney disease: Secondary | ICD-10-CM | POA: Diagnosis not present

## 2020-09-07 DIAGNOSIS — N179 Acute kidney failure, unspecified: Secondary | ICD-10-CM | POA: Diagnosis not present

## 2020-09-07 DIAGNOSIS — K2940 Chronic atrophic gastritis without bleeding: Secondary | ICD-10-CM | POA: Diagnosis not present

## 2020-09-07 DIAGNOSIS — E43 Unspecified severe protein-calorie malnutrition: Secondary | ICD-10-CM | POA: Diagnosis not present

## 2020-09-07 DIAGNOSIS — R7881 Bacteremia: Secondary | ICD-10-CM | POA: Diagnosis not present

## 2020-09-07 DIAGNOSIS — I083 Combined rheumatic disorders of mitral, aortic and tricuspid valves: Secondary | ICD-10-CM | POA: Diagnosis not present

## 2020-09-07 DIAGNOSIS — F32A Depression, unspecified: Secondary | ICD-10-CM | POA: Diagnosis not present

## 2020-09-07 DIAGNOSIS — E871 Hypo-osmolality and hyponatremia: Secondary | ICD-10-CM | POA: Diagnosis not present

## 2020-09-07 DIAGNOSIS — E739 Lactose intolerance, unspecified: Secondary | ICD-10-CM | POA: Diagnosis not present

## 2020-09-07 DIAGNOSIS — E039 Hypothyroidism, unspecified: Secondary | ICD-10-CM | POA: Diagnosis not present

## 2020-09-07 DIAGNOSIS — I4819 Other persistent atrial fibrillation: Secondary | ICD-10-CM | POA: Diagnosis not present

## 2020-09-07 DIAGNOSIS — I517 Cardiomegaly: Secondary | ICD-10-CM | POA: Diagnosis not present

## 2020-09-07 DIAGNOSIS — I2510 Atherosclerotic heart disease of native coronary artery without angina pectoris: Secondary | ICD-10-CM | POA: Diagnosis not present

## 2020-09-07 DIAGNOSIS — M4645 Discitis, unspecified, thoracolumbar region: Secondary | ICD-10-CM | POA: Diagnosis not present

## 2020-09-07 DIAGNOSIS — S22019D Unspecified fracture of first thoracic vertebra, subsequent encounter for fracture with routine healing: Secondary | ICD-10-CM | POA: Diagnosis not present

## 2020-09-07 DIAGNOSIS — N1831 Chronic kidney disease, stage 3a: Secondary | ICD-10-CM | POA: Diagnosis not present

## 2020-09-07 DIAGNOSIS — B952 Enterococcus as the cause of diseases classified elsewhere: Secondary | ICD-10-CM | POA: Diagnosis not present

## 2020-09-07 DIAGNOSIS — I130 Hypertensive heart and chronic kidney disease with heart failure and stage 1 through stage 4 chronic kidney disease, or unspecified chronic kidney disease: Secondary | ICD-10-CM | POA: Diagnosis not present

## 2020-09-07 DIAGNOSIS — M5136 Other intervertebral disc degeneration, lumbar region: Secondary | ICD-10-CM | POA: Diagnosis not present

## 2020-09-07 DIAGNOSIS — I700 Atherosclerosis of aorta: Secondary | ICD-10-CM | POA: Diagnosis not present

## 2020-09-07 NOTE — Telephone Encounter (Signed)
Advised patient that her creatinine is elevated. Instructed patient to follow up with her primary care physician regarding her kidney function. Patient verbalized understanding and has no further questions.   Beryle Flock, RN

## 2020-09-07 NOTE — Telephone Encounter (Signed)
-----   Message from Truman Hayward, MD sent at 09/07/2020  8:44 AM EDT ----- Blanch Media has a bump in her serum creatinine and should follow-up with primary care physician I wonder if she is dehydrated

## 2020-09-10 ENCOUNTER — Other Ambulatory Visit: Payer: Self-pay

## 2020-09-10 ENCOUNTER — Ambulatory Visit (INDEPENDENT_AMBULATORY_CARE_PROVIDER_SITE_OTHER): Payer: Medicare Other | Admitting: Family Medicine

## 2020-09-10 ENCOUNTER — Encounter: Payer: Self-pay | Admitting: Family Medicine

## 2020-09-10 VITALS — BP 130/50 | HR 69 | Temp 97.9°F | Wt 89.3 lb

## 2020-09-10 DIAGNOSIS — G8929 Other chronic pain: Secondary | ICD-10-CM

## 2020-09-10 DIAGNOSIS — N289 Disorder of kidney and ureter, unspecified: Secondary | ICD-10-CM

## 2020-09-10 DIAGNOSIS — E43 Unspecified severe protein-calorie malnutrition: Secondary | ICD-10-CM | POA: Diagnosis not present

## 2020-09-10 DIAGNOSIS — M549 Dorsalgia, unspecified: Secondary | ICD-10-CM | POA: Diagnosis not present

## 2020-09-10 MED ORDER — OXYCODONE HCL 5 MG PO TABS: 5.0000 mg | ORAL_TABLET | Freq: Four times a day (QID) | ORAL | 0 refills | Status: AC | PRN

## 2020-09-10 NOTE — Progress Notes (Signed)
Established Patient Office Visit  Subjective:  Patient ID: [name], female    DOB: 1932/11/22  Age: 87 y.o. MRN: [id number]  CC:  Chief Complaint  Patient presents with  . Follow-up    Follow up on lab work    HPI LIEL RUDDEN presents for follow-up.  Refer to recent note from 5/09-2020 for details.  Recent admission for Enterococcus bacteremia with aortic valve vegetation.  Was also found to have T12-L1 fracture with possible discitis.  She completed antibiotic therapy and has had no recurrent fever.  She recently saw ID in follow-up and had follow-up sed rate and CBC which were unremarkable.  Repeat blood cultures negative.  Patient did have elevation of creatinine up to 1.5 when seen by ID.  We checked this 6 days prior and he she had creatinine 1.2 to.  We suspected probably dehydration related.  Her antibiotics including ampicillin and Rocephin.  She is not aware of any obvious urinary difficulties.  No increased peripheral edema.  Her main complaint has been some ongoing back pain which has been severe at times and made sleep difficult.  She has tried over-the-counter Tylenol without much improvement.  She received limited immediate release oxycodone 5 mg from hospital and has been using that sparingly for severe pain.  Requesting refill.  She has had some generalized weakness and recent weight decline.  She is currently using boost supplement a couple times daily.  Past Medical History:  Diagnosis Date  . Allergy   . Anemia, pernicious   . Arthritis    bursitis   . Atrophic gastritis   . B12 deficiency   . Bacteremia due to Enterococcus 07/09/2020  . CAD (coronary artery disease)   . Carotid artery stenosis   . Cataract   . Diarrhea    on and off for months  . Enterococcus faecalis infection 09/06/2020  . Family history of adverse reaction to anesthesia    extreme nausea brother and sister  . Fibromyalgia   . Gallbladder polyp   . GERD (gastroesophageal reflux  disease)   . Heart murmur   . History of hiatal hernia   . History of kidney stones   . HLD (hyperlipidemia)   . HTN (hypertension)   . Hypothyroidism   . Irritable bowel syndrome (IBS)   . Median arcuate ligament syndrome ([place])   . Osteoporosis   . PVD (peripheral vascular disease) ([place])   . Tubular adenoma of colon 08/2013   with focal high grade dysplasia    Past Surgical History:  Procedure Laterality Date  . ABDOMINAL HYSTERECTOMY    . APPENDECTOMY    . BIOPSY THYROID    . CARDIOVERSION N/A 04/03/2020   Procedure: CARDIOVERSION;  Surgeon: Dorothy Spark, MD;  Location: [facility] ENDOSCOPY;  Service: Cardiovascular;  Laterality: N/A;  . cataract surgery    . CHOLECYSTECTOMY N/A 10/24/2014   Procedure: MICROSCOPIC  CHOLECYSTECTOMY;  Surgeon: Ralene Ok, MD;  Location: [place];  Service: General;  Laterality: N/A;  . COLONOSCOPY    . CORONARY ARTERY BYPASS GRAFT  07  . ESOPHAGEAL MANOMETRY N/A 08/02/2012   Procedure: ESOPHAGEAL MANOMETRY (EM);  Surgeon: Sable Feil, MD;  Location: [facility] ENDOSCOPY;  Service: Endoscopy;  Laterality: N/A;  . ESOPHAGOGASTRODUODENOSCOPY (EGD) WITH PROPOFOL N/A 04/07/2019   Procedure: ESOPHAGOGASTRODUODENOSCOPY (EGD) WITH PROPOFOL;  Surgeon: Milus Banister, MD;  Location: [facility] ENDOSCOPY;  Service: Endoscopy;  Laterality: N/A;  . HEMOSTASIS CLIP PLACEMENT  04/07/2019   Procedure: HEMOSTASIS CLIP PLACEMENT;  Surgeon: Milus Banister, MD;  Location: [facility] ENDOSCOPY;  Service: Endoscopy;;  . HOT HEMOSTASIS N/A 04/07/2019   Procedure: HOT HEMOSTASIS (ARGON PLASMA COAGULATION/BICAP);  Surgeon: Milus Banister, MD;  Location: [facility] ENDOSCOPY;  Service: Endoscopy;  Laterality: N/A;  . POLYPECTOMY  04/07/2019   Procedure: POLYPECTOMY;  Surgeon: Milus Banister, MD;  Location: [facility] ENDOSCOPY;  Service: Endoscopy;;  . [name]  04/07/2019   Procedure: [name];  Surgeon: Milus Banister, MD;  Location: [facility] ENDOSCOPY;  Service: Endoscopy;;  . TEE WITHOUT  CARDIOVERSION N/A 04/03/2020   Procedure: TRANSESOPHAGEAL ECHOCARDIOGRAM (TEE);  Surgeon: Dorothy Spark, MD;  Location: [facility] ENDOSCOPY;  Service: Cardiovascular;  Laterality: N/A;  . THYROID LOBECTOMY    . UPPER GASTROINTESTINAL ENDOSCOPY      Family History  Problem Relation Age of Onset  . Heart disease Mother   . Cholelithiasis Mother   . Prostate cancer Brother   . Colon cancer Brother   . Breast cancer Other        niece - breast cancer  . Diabetes Sister   . Lung cancer Father   . Lung cancer Sister   . Stomach cancer Cousin   . Esophageal cancer Neg Hx   . Rectal cancer Neg Hx     Social History   Socioeconomic History  . Marital status: Married    Spouse name: [name]  . Number of children: 0  . Years of education: 36  . Highest education level: Not on file  Occupational History  . Occupation: retired    Fish farm manager: RETIRED  Tobacco Use  . Smoking status: Never Smoker  . Smokeless tobacco: Never Used  Vaping Use  . Vaping Use: Never used  Substance and Sexual Activity  . Alcohol use: No    Alcohol/week: 0.0 standard drinks  . Drug use: No  . Sexual activity: Not on file  Other Topics Concern  . Not on file  Social History Narrative   [name], Married. Lives with husband. Had been a very active woman.   Social Determinants of Health   Financial Resource Strain: Low Risk   . Difficulty of Paying Living Expenses: Not hard at all  Food Insecurity: Not on file  Transportation Needs: No Transportation Needs  . Lack of Transportation (Medical): No  . Lack of Transportation (Non-Medical): No  Physical Activity: Not on file  Stress: Not on file  Social Connections: Not on file  Intimate Partner Violence: Not on file    Outpatient Medications Prior to Visit  Medication Sig Dispense Refill  . acetaminophen (TYLENOL) 500 MG tablet Take 2 tablets (1,000 mg total) by mouth every 8 (eight) hours as needed for mild pain. 30 tablet 0  . atorvastatin (LIPITOR) 80 MG  tablet Take 0.5 tablets (40 mg total) by mouth at bedtime. 45 tablet 0  . Calcium Carbonate-Vitamin D (CALTRATE 600+D PO) Take 1 tablet by mouth daily with lunch.     Marland Kitchen ELIQUIS 2.5 MG TABS tablet TAKE 1 TABLET BY MOUTH TWICE DAILY (Patient taking differently: Take 2.5 mg by mouth 2 (two) times daily.) 180 tablet 1  . furosemide (LASIX) 20 MG tablet Take 3 tablets (60 mg total) by mouth daily.    Marland Kitchen gabapentin (NEURONTIN) 100 MG capsule Take 1 capsule (100 mg total) by mouth 2 (two) times daily. 60 capsule 1  . hyoscyamine (LEVSIN SL) 0.125 MG SL tablet Place 1 tablet (0.125 mg total) under the tongue every 6 (six) hours as needed. (Patient taking differently: Place 0.125  mg under the tongue every 6 (six) hours as needed for cramping.) 30 tablet 2  . Infant Care Products [facility]) OINT Apply 1 application topically in the morning, at noon, and at bedtime.    Marland Kitchen ipratropium (ATROVENT) 0.03 % nasal spray Place 2 sprays into both nostrils every 12 (twelve) hours.    Marland Kitchen lactase (LACTAID) 3000 units tablet Take 3,000 Units by mouth every 6 (six) hours as needed (lactose Intolerance).    . lactose free nutrition (BOOST PLUS) LIQD Take 237 mLs by mouth 3 (three) times daily with meals.  0  . levothyroxine (SYNTHROID) 75 MCG tablet Take 1 tablet (75 mcg total) by mouth daily at 6 (six) AM.    . Lidocaine 4 % PTCH Place 1 patch onto the skin daily as needed (pain.).     Marland Kitchen Lidocaine-Menthol (LIDOCAINE-MENTHOL ROLL-ON) 4-1 % LIQD Apply 1 application topically every 8 (eight) hours as needed (pain).    . Multiple Vitamins-Minerals (PRESERVISION AREDS 2 PO) Take 1 tablet by mouth 2 (two) times daily.    . nitroGLYCERIN (NITROSTAT) 0.4 MG SL tablet Place 0.4 mg under the tongue every 5 (five) minutes as needed for chest pain.    Vladimir Faster Glycol-Propyl Glycol (SYSTANE FREE OP) Place 1 drop into both eyes every 8 (eight) hours as needed (dry eyes).    . polyethylene glycol (MIRALAX / GLYCOLAX) 17 g packet Take 17  g by mouth daily. 14 each 0  . saccharomyces boulardii (FLORASTOR) 250 MG capsule Take 250 mg by mouth daily as needed (digestion).    . sennosides-docusate sodium (SENOKOT-S) 8.6-50 MG tablet Take 1 tablet by mouth daily.    . sodium chloride (OCEAN) 0.65 % SOLN nasal spray Place 1 spray into both nostrils daily as needed for congestion.    . sucralfate (CARAFATE) 1 GM/10ML suspension TAKE 10 MLS (1 G TOTAL) BY MOUTH 2 (TWO) TIMES DAILY. (Patient taking differently: Take 1 g by mouth 2 (two) times daily.) 420 mL 1  . traZODone (DESYREL) 50 MG tablet Take 25 mg by mouth at bedtime.    Marland Kitchen oxyCODONE (OXY IR/ROXICODONE) 5 MG immediate release tablet Take 1 tablet (5 mg total) by mouth every 6 (six) hours as needed for severe pain. 20 tablet 0   No facility-administered medications prior to visit.    Allergies  Allergen Reactions  . Actos [Pioglitazone] Swelling    Ankle swelling  . Sulfonamide Derivatives Swelling  . Adhesive [Tape] Other (See Comments)    WITH PROLONGED EXPOSURE (REDNESS/ITCHING)  . Codeine Nausea And Vomiting    ROS Review of Systems  Constitutional: Positive for fatigue.  Eyes: Negative for visual disturbance.  Respiratory: Negative for cough, chest tightness, shortness of breath and wheezing.   Cardiovascular: Negative for chest pain, palpitations and leg swelling.  Genitourinary: Negative for dysuria.  Musculoskeletal: Positive for back pain.  Neurological: Negative for dizziness, seizures, syncope, weakness, light-headedness and headaches.      Objective:    Physical Exam Constitutional:      Appearance: She is well-developed.  Eyes:     Pupils: Pupils are equal, round, and reactive to light.  Neck:     Thyroid: No thyromegaly.     Vascular: No JVD.  Cardiovascular:     Rate and Rhythm: Normal rate and regular rhythm.     Heart sounds: No gallop.   Pulmonary:     Effort: Pulmonary effort is normal. No respiratory distress.     Breath sounds: Normal  breath sounds. No  wheezing or rales.  Musculoskeletal:     Cervical back: Neck supple.     Right lower leg: No edema.     Left lower leg: No edema.  Neurological:     Mental Status: She is alert.     BP (!) 130/50 (BP Location: Left Arm, Patient Position: Sitting, Cuff Size: Normal)   Pulse 69   Temp 97.9 F (36.6 C) (Oral)   Wt 89 lb 4.8 oz (40.5 kg)   SpO2 95%   BMI 15.82 kg/m  Wt Readings from Last 3 Encounters:  09/10/20 89 lb 4.8 oz (40.5 kg)  09/06/20 89 lb (40.4 kg)  08/31/20 93 lb (42.2 kg)     Health Maintenance Due  Topic Date Due  . TETANUS/TDAP  Never done    There are no preventive care reminders to display for this patient.  Lab Results  Component Value Date   TSH 5.65 (H) 08/31/2020   Lab Results  Component Value Date   WBC 4.5 09/06/2020   HGB 9.0 (L) 09/06/2020   HCT 30.0 (L) 09/06/2020   MCV 76.9 (L) 09/06/2020   PLT 209 09/06/2020   Lab Results  Component Value Date   NA 141 09/06/2020   K 3.5 09/06/2020   CO2 30 09/06/2020   GLUCOSE 94 09/06/2020   BUN 24 09/06/2020   CREATININE 1.50 (H) 09/06/2020   BILITOT 0.5 07/22/2020   ALKPHOS 78 07/22/2020   AST 36 07/22/2020   ALT 32 07/22/2020   PROT 5.5 (L) 07/22/2020   ALBUMIN 1.8 (L) 07/24/2020   CALCIUM 8.8 09/06/2020   ANIONGAP 5 07/28/2020   GFR 40.37 (L) 08/31/2020   Lab Results  Component Value Date   CHOL 131 05/23/2020   Lab Results  Component Value Date   HDL 64 05/23/2020   Lab Results  Component Value Date   LDLCALC 47 05/23/2020   Lab Results  Component Value Date   TRIG 115 05/23/2020   Lab Results  Component Value Date   CHOLHDL 2.0 05/23/2020   Lab Results  Component Value Date   HGBA1C 5.8 11/18/2016      Assessment & Plan:   #1 acute renal insufficiency.  Suspect probably prerenal.  She has been hydrating well today and will recheck basic metabolic panel.  If creatinine still climbing will engage in further work-up -Strict avoidance of  non-steroidals  #2 fairly severe protein calorie malnutrition.  Underweight.  BMI less than 16. -Continue boost supplements. -Consider further nutritional consult/evaluation  #3 back pain.  Recent discitis.  Recent C-reactive protein, sed rate, white blood count normal with no recurrent fevers.  Recent follow-up blood cultures negative. -Limited refill of oxycodone 5 mg 1 nightly for severe pain.  She is aware this may cause some constipation.  #4 hypothyroidism.  TSH improving.  Her TSH went from 65 in March to 5.65 last visit.  Recommend 74-month follow-up and reassess at that time   Meds ordered this encounter  Medications  . oxyCODONE (OXY IR/ROXICODONE) 5 MG immediate release tablet    Sig: Take 1 tablet (5 mg total) by mouth every 6 (six) hours as needed for severe pain.    Dispense:  20 tablet    Refill:  0    Follow-up: No follow-ups on file.    Carolann Littler, MD

## 2020-09-10 NOTE — Patient Instructions (Signed)
Stay well hydrated  We will repeat kidney function today and be in touch if not improving to consider further evaluation.

## 2020-09-11 ENCOUNTER — Telehealth: Payer: Self-pay | Admitting: Family Medicine

## 2020-09-11 DIAGNOSIS — I2510 Atherosclerotic heart disease of native coronary artery without angina pectoris: Secondary | ICD-10-CM | POA: Diagnosis not present

## 2020-09-11 DIAGNOSIS — S22019D Unspecified fracture of first thoracic vertebra, subsequent encounter for fracture with routine healing: Secondary | ICD-10-CM | POA: Diagnosis not present

## 2020-09-11 DIAGNOSIS — E039 Hypothyroidism, unspecified: Secondary | ICD-10-CM | POA: Diagnosis not present

## 2020-09-11 DIAGNOSIS — K2940 Chronic atrophic gastritis without bleeding: Secondary | ICD-10-CM | POA: Diagnosis not present

## 2020-09-11 DIAGNOSIS — I700 Atherosclerosis of aorta: Secondary | ICD-10-CM | POA: Diagnosis not present

## 2020-09-11 DIAGNOSIS — J321 Chronic frontal sinusitis: Secondary | ICD-10-CM | POA: Diagnosis not present

## 2020-09-11 DIAGNOSIS — F32A Depression, unspecified: Secondary | ICD-10-CM | POA: Diagnosis not present

## 2020-09-11 DIAGNOSIS — M5136 Other intervertebral disc degeneration, lumbar region: Secondary | ICD-10-CM | POA: Diagnosis not present

## 2020-09-11 DIAGNOSIS — N1831 Chronic kidney disease, stage 3a: Secondary | ICD-10-CM | POA: Diagnosis not present

## 2020-09-11 DIAGNOSIS — I4819 Other persistent atrial fibrillation: Secondary | ICD-10-CM | POA: Diagnosis not present

## 2020-09-11 DIAGNOSIS — S22089D Unspecified fracture of T11-T12 vertebra, subsequent encounter for fracture with routine healing: Secondary | ICD-10-CM | POA: Diagnosis not present

## 2020-09-11 DIAGNOSIS — B952 Enterococcus as the cause of diseases classified elsewhere: Secondary | ICD-10-CM | POA: Diagnosis not present

## 2020-09-11 DIAGNOSIS — D631 Anemia in chronic kidney disease: Secondary | ICD-10-CM | POA: Diagnosis not present

## 2020-09-11 DIAGNOSIS — E739 Lactose intolerance, unspecified: Secondary | ICD-10-CM | POA: Diagnosis not present

## 2020-09-11 DIAGNOSIS — I517 Cardiomegaly: Secondary | ICD-10-CM | POA: Diagnosis not present

## 2020-09-11 DIAGNOSIS — I130 Hypertensive heart and chronic kidney disease with heart failure and stage 1 through stage 4 chronic kidney disease, or unspecified chronic kidney disease: Secondary | ICD-10-CM | POA: Diagnosis not present

## 2020-09-11 DIAGNOSIS — I083 Combined rheumatic disorders of mitral, aortic and tricuspid valves: Secondary | ICD-10-CM | POA: Diagnosis not present

## 2020-09-11 DIAGNOSIS — E871 Hypo-osmolality and hyponatremia: Secondary | ICD-10-CM | POA: Diagnosis not present

## 2020-09-11 DIAGNOSIS — N179 Acute kidney failure, unspecified: Secondary | ICD-10-CM | POA: Diagnosis not present

## 2020-09-11 DIAGNOSIS — E43 Unspecified severe protein-calorie malnutrition: Secondary | ICD-10-CM | POA: Diagnosis not present

## 2020-09-11 DIAGNOSIS — M40209 Unspecified kyphosis, site unspecified: Secondary | ICD-10-CM | POA: Diagnosis not present

## 2020-09-11 DIAGNOSIS — R7881 Bacteremia: Secondary | ICD-10-CM | POA: Diagnosis not present

## 2020-09-11 DIAGNOSIS — M4645 Discitis, unspecified, thoracolumbar region: Secondary | ICD-10-CM | POA: Diagnosis not present

## 2020-09-11 DIAGNOSIS — I5043 Acute on chronic combined systolic (congestive) and diastolic (congestive) heart failure: Secondary | ICD-10-CM | POA: Diagnosis not present

## 2020-09-11 LAB — BASIC METABOLIC PANEL
BUN: 26 mg/dL — ABNORMAL HIGH (ref 6–23)
CO2: 30 mEq/L (ref 19–32)
Calcium: 8.4 mg/dL (ref 8.4–10.5)
Chloride: 99 mEq/L (ref 96–112)
Creatinine, Ser: 1.75 mg/dL — ABNORMAL HIGH (ref 0.40–1.20)
GFR: 26.18 mL/min — ABNORMAL LOW (ref >60.00)
Glucose, Bld: 149 mg/dL — ABNORMAL HIGH (ref 70–99)
Potassium: 3.5 mEq/L (ref 3.5–5.1)
Sodium: 138 mEq/L (ref 135–145)

## 2020-09-11 NOTE — Telephone Encounter (Signed)
Left a detailed message on verified voice mail giving the ok for orders. Nothing further needed.  

## 2020-09-11 NOTE — Telephone Encounter (Signed)
Ronalee Belts from [facility] call and stated he need a verbal order for occupational therapy on this pt and want a call back at this # [phone]

## 2020-09-11 NOTE — Telephone Encounter (Signed)
ok 

## 2020-09-12 DIAGNOSIS — S22089D Unspecified fracture of T11-T12 vertebra, subsequent encounter for fracture with routine healing: Secondary | ICD-10-CM | POA: Diagnosis not present

## 2020-09-12 DIAGNOSIS — I5043 Acute on chronic combined systolic (congestive) and diastolic (congestive) heart failure: Secondary | ICD-10-CM | POA: Diagnosis not present

## 2020-09-12 DIAGNOSIS — N1831 Chronic kidney disease, stage 3a: Secondary | ICD-10-CM | POA: Diagnosis not present

## 2020-09-12 DIAGNOSIS — D631 Anemia in chronic kidney disease: Secondary | ICD-10-CM | POA: Diagnosis not present

## 2020-09-12 DIAGNOSIS — M4645 Discitis, unspecified, thoracolumbar region: Secondary | ICD-10-CM | POA: Diagnosis not present

## 2020-09-12 DIAGNOSIS — E43 Unspecified severe protein-calorie malnutrition: Secondary | ICD-10-CM | POA: Diagnosis not present

## 2020-09-12 DIAGNOSIS — M5136 Other intervertebral disc degeneration, lumbar region: Secondary | ICD-10-CM | POA: Diagnosis not present

## 2020-09-12 DIAGNOSIS — I700 Atherosclerosis of aorta: Secondary | ICD-10-CM | POA: Diagnosis not present

## 2020-09-12 DIAGNOSIS — E039 Hypothyroidism, unspecified: Secondary | ICD-10-CM | POA: Diagnosis not present

## 2020-09-12 DIAGNOSIS — I517 Cardiomegaly: Secondary | ICD-10-CM | POA: Diagnosis not present

## 2020-09-12 DIAGNOSIS — E739 Lactose intolerance, unspecified: Secondary | ICD-10-CM | POA: Diagnosis not present

## 2020-09-12 DIAGNOSIS — I2510 Atherosclerotic heart disease of native coronary artery without angina pectoris: Secondary | ICD-10-CM | POA: Diagnosis not present

## 2020-09-12 DIAGNOSIS — K2940 Chronic atrophic gastritis without bleeding: Secondary | ICD-10-CM | POA: Diagnosis not present

## 2020-09-12 DIAGNOSIS — B952 Enterococcus as the cause of diseases classified elsewhere: Secondary | ICD-10-CM | POA: Diagnosis not present

## 2020-09-12 DIAGNOSIS — J321 Chronic frontal sinusitis: Secondary | ICD-10-CM | POA: Diagnosis not present

## 2020-09-12 DIAGNOSIS — R7881 Bacteremia: Secondary | ICD-10-CM | POA: Diagnosis not present

## 2020-09-12 DIAGNOSIS — I130 Hypertensive heart and chronic kidney disease with heart failure and stage 1 through stage 4 chronic kidney disease, or unspecified chronic kidney disease: Secondary | ICD-10-CM | POA: Diagnosis not present

## 2020-09-12 DIAGNOSIS — I4819 Other persistent atrial fibrillation: Secondary | ICD-10-CM | POA: Diagnosis not present

## 2020-09-12 DIAGNOSIS — I083 Combined rheumatic disorders of mitral, aortic and tricuspid valves: Secondary | ICD-10-CM | POA: Diagnosis not present

## 2020-09-12 DIAGNOSIS — N179 Acute kidney failure, unspecified: Secondary | ICD-10-CM | POA: Diagnosis not present

## 2020-09-12 DIAGNOSIS — S22019D Unspecified fracture of first thoracic vertebra, subsequent encounter for fracture with routine healing: Secondary | ICD-10-CM | POA: Diagnosis not present

## 2020-09-12 DIAGNOSIS — E871 Hypo-osmolality and hyponatremia: Secondary | ICD-10-CM | POA: Diagnosis not present

## 2020-09-12 DIAGNOSIS — M40209 Unspecified kyphosis, site unspecified: Secondary | ICD-10-CM | POA: Diagnosis not present

## 2020-09-12 DIAGNOSIS — F32A Depression, unspecified: Secondary | ICD-10-CM | POA: Diagnosis not present

## 2020-09-12 LAB — CBC WITH DIFFERENTIAL/PLATELET
Absolute Monocytes: 594 cells/uL (ref 200–950)
Basophils Absolute: 50 cells/uL (ref 0–200)
Basophils Relative: 1.1 %
Eosinophils Absolute: 72 cells/uL (ref 15–500)
Eosinophils Relative: 1.6 %
HCT: 30 % — ABNORMAL LOW (ref 35.0–45.0)
Hemoglobin: 9 g/dL — ABNORMAL LOW (ref 11.7–15.5)
Lymphs Abs: 896 cells/uL (ref 850–3900)
MCH: 23.1 pg — ABNORMAL LOW (ref 27.0–33.0)
MCHC: 30 g/dL — ABNORMAL LOW (ref 32.0–36.0)
MCV: 76.9 fL — ABNORMAL LOW (ref 80.0–100.0)
MPV: 9.5 fL (ref 7.5–12.5)
Monocytes Relative: 13.2 %
Neutro Abs: 2889 cells/uL (ref 1500–7800)
Neutrophils Relative %: 64.2 %
Platelets: 209 10*3/uL (ref 140–400)
RBC: 3.9 10*6/uL (ref 3.80–5.10)
RDW: 15.7 % — ABNORMAL HIGH (ref 11.0–15.0)
Total Lymphocyte: 19.9 %
WBC: 4.5 10*3/uL (ref 3.8–10.8)

## 2020-09-12 LAB — CULTURE, BLOOD (SINGLE)
MICRO NUMBER:: 11887680
MICRO NUMBER:: 11887681
Result:: NO GROWTH
Result:: NO GROWTH
SPECIMEN QUALITY:: ADEQUATE
SPECIMEN QUALITY:: ADEQUATE

## 2020-09-12 LAB — BASIC METABOLIC PANEL WITH GFR
BUN/Creatinine Ratio: 16 (calc) (ref 6–22)
BUN: 24 mg/dL (ref 7–25)
CO2: 30 mmol/L (ref 20–32)
Calcium: 8.8 mg/dL (ref 8.6–10.4)
Chloride: 102 mmol/L (ref 98–110)
Creat: 1.5 mg/dL — ABNORMAL HIGH (ref 0.60–0.88)
GFR, Est African American: 36 mL/min/{1.73_m2} — ABNORMAL LOW (ref >=60)
GFR, Est Non African American: 31 mL/min/{1.73_m2} — ABNORMAL LOW (ref >=60)
Glucose, Bld: 94 mg/dL (ref 65–99)
Potassium: 3.5 mmol/L (ref 3.5–5.3)
Sodium: 141 mmol/L (ref 135–146)

## 2020-09-12 LAB — SEDIMENTATION RATE: Sed Rate: 14 mm/h (ref 0–30)

## 2020-09-12 LAB — C-REACTIVE PROTEIN: CRP: 1.1 mg/L (ref <8.0)

## 2020-09-12 NOTE — Addendum Note (Signed)
Addended by: [name] on: 09/12/2020 06:57 AM   Modules accepted: Orders

## 2020-09-13 DIAGNOSIS — I517 Cardiomegaly: Secondary | ICD-10-CM | POA: Diagnosis not present

## 2020-09-13 DIAGNOSIS — I2510 Atherosclerotic heart disease of native coronary artery without angina pectoris: Secondary | ICD-10-CM | POA: Diagnosis not present

## 2020-09-13 DIAGNOSIS — R7881 Bacteremia: Secondary | ICD-10-CM | POA: Diagnosis not present

## 2020-09-13 DIAGNOSIS — M40209 Unspecified kyphosis, site unspecified: Secondary | ICD-10-CM | POA: Diagnosis not present

## 2020-09-13 DIAGNOSIS — I5043 Acute on chronic combined systolic (congestive) and diastolic (congestive) heart failure: Secondary | ICD-10-CM | POA: Diagnosis not present

## 2020-09-13 DIAGNOSIS — I083 Combined rheumatic disorders of mitral, aortic and tricuspid valves: Secondary | ICD-10-CM | POA: Diagnosis not present

## 2020-09-13 DIAGNOSIS — E43 Unspecified severe protein-calorie malnutrition: Secondary | ICD-10-CM | POA: Diagnosis not present

## 2020-09-13 DIAGNOSIS — E039 Hypothyroidism, unspecified: Secondary | ICD-10-CM | POA: Diagnosis not present

## 2020-09-13 DIAGNOSIS — N179 Acute kidney failure, unspecified: Secondary | ICD-10-CM | POA: Diagnosis not present

## 2020-09-13 DIAGNOSIS — I700 Atherosclerosis of aorta: Secondary | ICD-10-CM | POA: Diagnosis not present

## 2020-09-13 DIAGNOSIS — E739 Lactose intolerance, unspecified: Secondary | ICD-10-CM | POA: Diagnosis not present

## 2020-09-13 DIAGNOSIS — M5136 Other intervertebral disc degeneration, lumbar region: Secondary | ICD-10-CM | POA: Diagnosis not present

## 2020-09-13 DIAGNOSIS — I4819 Other persistent atrial fibrillation: Secondary | ICD-10-CM | POA: Diagnosis not present

## 2020-09-13 DIAGNOSIS — M4645 Discitis, unspecified, thoracolumbar region: Secondary | ICD-10-CM | POA: Diagnosis not present

## 2020-09-13 DIAGNOSIS — B952 Enterococcus as the cause of diseases classified elsewhere: Secondary | ICD-10-CM | POA: Diagnosis not present

## 2020-09-13 DIAGNOSIS — N1831 Chronic kidney disease, stage 3a: Secondary | ICD-10-CM | POA: Diagnosis not present

## 2020-09-13 DIAGNOSIS — J321 Chronic frontal sinusitis: Secondary | ICD-10-CM | POA: Diagnosis not present

## 2020-09-13 DIAGNOSIS — E871 Hypo-osmolality and hyponatremia: Secondary | ICD-10-CM | POA: Diagnosis not present

## 2020-09-13 DIAGNOSIS — F32A Depression, unspecified: Secondary | ICD-10-CM | POA: Diagnosis not present

## 2020-09-13 DIAGNOSIS — D631 Anemia in chronic kidney disease: Secondary | ICD-10-CM | POA: Diagnosis not present

## 2020-09-13 DIAGNOSIS — S22089D Unspecified fracture of T11-T12 vertebra, subsequent encounter for fracture with routine healing: Secondary | ICD-10-CM | POA: Diagnosis not present

## 2020-09-13 DIAGNOSIS — K2940 Chronic atrophic gastritis without bleeding: Secondary | ICD-10-CM | POA: Diagnosis not present

## 2020-09-13 DIAGNOSIS — I130 Hypertensive heart and chronic kidney disease with heart failure and stage 1 through stage 4 chronic kidney disease, or unspecified chronic kidney disease: Secondary | ICD-10-CM | POA: Diagnosis not present

## 2020-09-13 DIAGNOSIS — S22019D Unspecified fracture of first thoracic vertebra, subsequent encounter for fracture with routine healing: Secondary | ICD-10-CM | POA: Diagnosis not present

## 2020-09-17 DIAGNOSIS — B952 Enterococcus as the cause of diseases classified elsewhere: Secondary | ICD-10-CM | POA: Diagnosis not present

## 2020-09-17 DIAGNOSIS — M5136 Other intervertebral disc degeneration, lumbar region: Secondary | ICD-10-CM | POA: Diagnosis not present

## 2020-09-17 DIAGNOSIS — E43 Unspecified severe protein-calorie malnutrition: Secondary | ICD-10-CM | POA: Diagnosis not present

## 2020-09-17 DIAGNOSIS — M4645 Discitis, unspecified, thoracolumbar region: Secondary | ICD-10-CM | POA: Diagnosis not present

## 2020-09-17 DIAGNOSIS — M40209 Unspecified kyphosis, site unspecified: Secondary | ICD-10-CM | POA: Diagnosis not present

## 2020-09-17 DIAGNOSIS — S22019D Unspecified fracture of first thoracic vertebra, subsequent encounter for fracture with routine healing: Secondary | ICD-10-CM | POA: Diagnosis not present

## 2020-09-17 DIAGNOSIS — E739 Lactose intolerance, unspecified: Secondary | ICD-10-CM | POA: Diagnosis not present

## 2020-09-17 DIAGNOSIS — I130 Hypertensive heart and chronic kidney disease with heart failure and stage 1 through stage 4 chronic kidney disease, or unspecified chronic kidney disease: Secondary | ICD-10-CM | POA: Diagnosis not present

## 2020-09-17 DIAGNOSIS — D631 Anemia in chronic kidney disease: Secondary | ICD-10-CM | POA: Diagnosis not present

## 2020-09-17 DIAGNOSIS — I2510 Atherosclerotic heart disease of native coronary artery without angina pectoris: Secondary | ICD-10-CM | POA: Diagnosis not present

## 2020-09-17 DIAGNOSIS — I4819 Other persistent atrial fibrillation: Secondary | ICD-10-CM | POA: Diagnosis not present

## 2020-09-17 DIAGNOSIS — K2940 Chronic atrophic gastritis without bleeding: Secondary | ICD-10-CM | POA: Diagnosis not present

## 2020-09-17 DIAGNOSIS — J321 Chronic frontal sinusitis: Secondary | ICD-10-CM | POA: Diagnosis not present

## 2020-09-17 DIAGNOSIS — I083 Combined rheumatic disorders of mitral, aortic and tricuspid valves: Secondary | ICD-10-CM | POA: Diagnosis not present

## 2020-09-17 DIAGNOSIS — N1831 Chronic kidney disease, stage 3a: Secondary | ICD-10-CM | POA: Diagnosis not present

## 2020-09-17 DIAGNOSIS — F32A Depression, unspecified: Secondary | ICD-10-CM | POA: Diagnosis not present

## 2020-09-17 DIAGNOSIS — E871 Hypo-osmolality and hyponatremia: Secondary | ICD-10-CM | POA: Diagnosis not present

## 2020-09-17 DIAGNOSIS — N179 Acute kidney failure, unspecified: Secondary | ICD-10-CM | POA: Diagnosis not present

## 2020-09-17 DIAGNOSIS — R7881 Bacteremia: Secondary | ICD-10-CM | POA: Diagnosis not present

## 2020-09-17 DIAGNOSIS — I5043 Acute on chronic combined systolic (congestive) and diastolic (congestive) heart failure: Secondary | ICD-10-CM | POA: Diagnosis not present

## 2020-09-17 DIAGNOSIS — I700 Atherosclerosis of aorta: Secondary | ICD-10-CM | POA: Diagnosis not present

## 2020-09-17 DIAGNOSIS — E039 Hypothyroidism, unspecified: Secondary | ICD-10-CM | POA: Diagnosis not present

## 2020-09-17 DIAGNOSIS — I517 Cardiomegaly: Secondary | ICD-10-CM | POA: Diagnosis not present

## 2020-09-17 DIAGNOSIS — S22089D Unspecified fracture of T11-T12 vertebra, subsequent encounter for fracture with routine healing: Secondary | ICD-10-CM | POA: Diagnosis not present

## 2020-09-20 ENCOUNTER — Ambulatory Visit
Admission: RE | Admit: 2020-09-20 | Discharge: 2020-09-20 | Disposition: A | Discharge status: Home / Self Care | Payer: Medicare Other | Source: Ambulatory Visit | Attending: Family Medicine | Admitting: Family Medicine

## 2020-09-20 ENCOUNTER — Other Ambulatory Visit: Payer: Self-pay

## 2020-09-20 DIAGNOSIS — N281 Cyst of kidney, acquired: Secondary | ICD-10-CM | POA: Diagnosis not present

## 2020-09-20 DIAGNOSIS — N289 Disorder of kidney and ureter, unspecified: Secondary | ICD-10-CM

## 2020-09-21 DIAGNOSIS — E43 Unspecified severe protein-calorie malnutrition: Secondary | ICD-10-CM | POA: Diagnosis not present

## 2020-09-21 DIAGNOSIS — N1831 Chronic kidney disease, stage 3a: Secondary | ICD-10-CM | POA: Diagnosis not present

## 2020-09-21 DIAGNOSIS — D631 Anemia in chronic kidney disease: Secondary | ICD-10-CM | POA: Diagnosis not present

## 2020-09-21 DIAGNOSIS — E039 Hypothyroidism, unspecified: Secondary | ICD-10-CM | POA: Diagnosis not present

## 2020-09-21 DIAGNOSIS — E871 Hypo-osmolality and hyponatremia: Secondary | ICD-10-CM | POA: Diagnosis not present

## 2020-09-21 DIAGNOSIS — R7881 Bacteremia: Secondary | ICD-10-CM | POA: Diagnosis not present

## 2020-09-21 DIAGNOSIS — M4645 Discitis, unspecified, thoracolumbar region: Secondary | ICD-10-CM | POA: Diagnosis not present

## 2020-09-21 DIAGNOSIS — I517 Cardiomegaly: Secondary | ICD-10-CM | POA: Diagnosis not present

## 2020-09-21 DIAGNOSIS — I5043 Acute on chronic combined systolic (congestive) and diastolic (congestive) heart failure: Secondary | ICD-10-CM | POA: Diagnosis not present

## 2020-09-21 DIAGNOSIS — F32A Depression, unspecified: Secondary | ICD-10-CM | POA: Diagnosis not present

## 2020-09-21 DIAGNOSIS — E739 Lactose intolerance, unspecified: Secondary | ICD-10-CM | POA: Diagnosis not present

## 2020-09-21 DIAGNOSIS — B952 Enterococcus as the cause of diseases classified elsewhere: Secondary | ICD-10-CM | POA: Diagnosis not present

## 2020-09-21 DIAGNOSIS — I2510 Atherosclerotic heart disease of native coronary artery without angina pectoris: Secondary | ICD-10-CM | POA: Diagnosis not present

## 2020-09-21 DIAGNOSIS — S22019D Unspecified fracture of first thoracic vertebra, subsequent encounter for fracture with routine healing: Secondary | ICD-10-CM | POA: Diagnosis not present

## 2020-09-21 DIAGNOSIS — M5136 Other intervertebral disc degeneration, lumbar region: Secondary | ICD-10-CM | POA: Diagnosis not present

## 2020-09-21 DIAGNOSIS — I130 Hypertensive heart and chronic kidney disease with heart failure and stage 1 through stage 4 chronic kidney disease, or unspecified chronic kidney disease: Secondary | ICD-10-CM | POA: Diagnosis not present

## 2020-09-21 DIAGNOSIS — J321 Chronic frontal sinusitis: Secondary | ICD-10-CM | POA: Diagnosis not present

## 2020-09-21 DIAGNOSIS — I083 Combined rheumatic disorders of mitral, aortic and tricuspid valves: Secondary | ICD-10-CM | POA: Diagnosis not present

## 2020-09-21 DIAGNOSIS — I4819 Other persistent atrial fibrillation: Secondary | ICD-10-CM | POA: Diagnosis not present

## 2020-09-21 DIAGNOSIS — M40209 Unspecified kyphosis, site unspecified: Secondary | ICD-10-CM | POA: Diagnosis not present

## 2020-09-21 DIAGNOSIS — N179 Acute kidney failure, unspecified: Secondary | ICD-10-CM | POA: Diagnosis not present

## 2020-09-21 DIAGNOSIS — S22089D Unspecified fracture of T11-T12 vertebra, subsequent encounter for fracture with routine healing: Secondary | ICD-10-CM | POA: Diagnosis not present

## 2020-09-21 DIAGNOSIS — K2940 Chronic atrophic gastritis without bleeding: Secondary | ICD-10-CM | POA: Diagnosis not present

## 2020-09-21 DIAGNOSIS — I700 Atherosclerosis of aorta: Secondary | ICD-10-CM | POA: Diagnosis not present

## 2020-09-24 DIAGNOSIS — I130 Hypertensive heart and chronic kidney disease with heart failure and stage 1 through stage 4 chronic kidney disease, or unspecified chronic kidney disease: Secondary | ICD-10-CM | POA: Diagnosis not present

## 2020-09-24 DIAGNOSIS — I517 Cardiomegaly: Secondary | ICD-10-CM | POA: Diagnosis not present

## 2020-09-24 DIAGNOSIS — B952 Enterococcus as the cause of diseases classified elsewhere: Secondary | ICD-10-CM | POA: Diagnosis not present

## 2020-09-24 DIAGNOSIS — D631 Anemia in chronic kidney disease: Secondary | ICD-10-CM | POA: Diagnosis not present

## 2020-09-24 DIAGNOSIS — E43 Unspecified severe protein-calorie malnutrition: Secondary | ICD-10-CM | POA: Diagnosis not present

## 2020-09-24 DIAGNOSIS — S22089D Unspecified fracture of T11-T12 vertebra, subsequent encounter for fracture with routine healing: Secondary | ICD-10-CM | POA: Diagnosis not present

## 2020-09-24 DIAGNOSIS — R7881 Bacteremia: Secondary | ICD-10-CM | POA: Diagnosis not present

## 2020-09-24 DIAGNOSIS — S22019D Unspecified fracture of first thoracic vertebra, subsequent encounter for fracture with routine healing: Secondary | ICD-10-CM | POA: Diagnosis not present

## 2020-09-24 DIAGNOSIS — J321 Chronic frontal sinusitis: Secondary | ICD-10-CM | POA: Diagnosis not present

## 2020-09-24 DIAGNOSIS — I4819 Other persistent atrial fibrillation: Secondary | ICD-10-CM | POA: Diagnosis not present

## 2020-09-24 DIAGNOSIS — I700 Atherosclerosis of aorta: Secondary | ICD-10-CM | POA: Diagnosis not present

## 2020-09-24 DIAGNOSIS — I2510 Atherosclerotic heart disease of native coronary artery without angina pectoris: Secondary | ICD-10-CM | POA: Diagnosis not present

## 2020-09-24 DIAGNOSIS — N1831 Chronic kidney disease, stage 3a: Secondary | ICD-10-CM | POA: Diagnosis not present

## 2020-09-24 DIAGNOSIS — I5043 Acute on chronic combined systolic (congestive) and diastolic (congestive) heart failure: Secondary | ICD-10-CM | POA: Diagnosis not present

## 2020-09-24 DIAGNOSIS — N179 Acute kidney failure, unspecified: Secondary | ICD-10-CM | POA: Diagnosis not present

## 2020-09-24 DIAGNOSIS — M40209 Unspecified kyphosis, site unspecified: Secondary | ICD-10-CM | POA: Diagnosis not present

## 2020-09-24 DIAGNOSIS — I083 Combined rheumatic disorders of mitral, aortic and tricuspid valves: Secondary | ICD-10-CM | POA: Diagnosis not present

## 2020-09-24 DIAGNOSIS — F32A Depression, unspecified: Secondary | ICD-10-CM | POA: Diagnosis not present

## 2020-09-24 DIAGNOSIS — K2940 Chronic atrophic gastritis without bleeding: Secondary | ICD-10-CM | POA: Diagnosis not present

## 2020-09-24 DIAGNOSIS — E039 Hypothyroidism, unspecified: Secondary | ICD-10-CM | POA: Diagnosis not present

## 2020-09-24 DIAGNOSIS — E871 Hypo-osmolality and hyponatremia: Secondary | ICD-10-CM | POA: Diagnosis not present

## 2020-09-24 DIAGNOSIS — M5136 Other intervertebral disc degeneration, lumbar region: Secondary | ICD-10-CM | POA: Diagnosis not present

## 2020-09-24 DIAGNOSIS — M4645 Discitis, unspecified, thoracolumbar region: Secondary | ICD-10-CM | POA: Diagnosis not present

## 2020-09-24 DIAGNOSIS — E739 Lactose intolerance, unspecified: Secondary | ICD-10-CM | POA: Diagnosis not present

## 2020-09-25 ENCOUNTER — Encounter: Payer: Self-pay | Admitting: Family Medicine

## 2020-09-25 ENCOUNTER — Other Ambulatory Visit: Payer: Self-pay

## 2020-09-25 ENCOUNTER — Ambulatory Visit (INDEPENDENT_AMBULATORY_CARE_PROVIDER_SITE_OTHER): Payer: Medicare Other | Admitting: Family Medicine

## 2020-09-25 VITALS — BP 130/80 | HR 73 | Temp 98.3°F | Wt 98.0 lb

## 2020-09-25 DIAGNOSIS — N289 Disorder of kidney and ureter, unspecified: Secondary | ICD-10-CM

## 2020-09-25 DIAGNOSIS — L97521 Non-pressure chronic ulcer of other part of left foot limited to breakdown of skin: Secondary | ICD-10-CM | POA: Diagnosis not present

## 2020-09-25 DIAGNOSIS — R600 Localized edema: Secondary | ICD-10-CM

## 2020-09-25 MED ORDER — TRIAMCINOLONE ACETONIDE 0.1 % EX CREA: 1.0000 "application " | TOPICAL_CREAM | Freq: Two times a day (BID) | CUTANEOUS | 1 refills | Status: AC

## 2020-09-25 NOTE — Progress Notes (Signed)
Established Patient Office Visit  Subjective:  Patient ID: [name], female    DOB: 1933-03-02  Age: 87 y.o. MRN: [id number]  CC:  Chief Complaint  Patient presents with  . Follow-up    HPI [name] presents for further evaluation of recent declining renal function.  She had couple recent hospital admissions and lengthy rehab stay.  Back in March she had fall with fracture of T12-L1 and concern for possible discitis.  Ended up with Enterococcus bacteremia with aortic valve vegetation.  Urine culture grew out E. coli and blood culture grew out Enterococcus faecalis.  Patient was on prolonged therapy with antibiotics with ampicillin and Rocephin.  She then had repeat admission March 27 for increased shortness of breath and peripheral edema and echocardiogram showed ejection fraction of 02% with diastolic dysfunction.  Elevated TSH.  She has been on Lasix 20 mg 3 tablets daily since then until she ran out about 3 to 5 days ago.  Has had some mild edema since that time.  She has had some gradually declining renal function.  Her creatinine on 07-28-2020 was 0.89 and this had declined to 1.2 to on 08-31-2020, 1.50 on 5- 12-22, and 1.75 on 09-10-2020 No nonsteroidal use.  Recent renal ultrasound revealed no hydronephrosis.  She had changes consistent with chronic medical renal disease.  She states that with her inability to cook her husband's been picking up takeout meals frequently and she realizes she may be getting a lot of sodium.  She has had some bilateral leg edema worse in the left leg since she ran out of her furosemide but no increased dyspnea.  Her weight is up though today almost 9 pounds from last visit.  She has superficial ulcer left heel which has been present for months and very slow to heal.  She does not have any significant peripheral vascular disease history and no history of diabetes.  She also has couple of large eschars dorsum right foot.  She states she had a couple  of large blisters in this area and after they ruptured they healed with large's eschar.  No erythema.  She would like to see a podiatrist.  She has very dystrophic toenails.  Past Medical History:  Diagnosis Date  . Allergy   . Anemia, pernicious   . Arthritis    bursitis   . Atrophic gastritis   . B12 deficiency   . Bacteremia due to Enterococcus 07/09/2020  . CAD (coronary artery disease)   . Carotid artery stenosis   . Cataract   . Diarrhea    on and off for months  . Enterococcus faecalis infection 09/06/2020  . Family history of adverse reaction to anesthesia    extreme nausea brother and sister  . Fibromyalgia   . Gallbladder polyp   . GERD (gastroesophageal reflux disease)   . Heart murmur   . History of hiatal hernia   . History of kidney stones   . HLD (hyperlipidemia)   . HTN (hypertension)   . Hypothyroidism   . Irritable bowel syndrome (IBS)   . Median arcuate ligament syndrome ([place])   . Osteoporosis   . PVD (peripheral vascular disease) ([place])   . Tubular adenoma of colon 08/2013   with focal high grade dysplasia    Past Surgical History:  Procedure Laterality Date  . ABDOMINAL HYSTERECTOMY    . APPENDECTOMY    . BIOPSY THYROID    . CARDIOVERSION N/A 04/03/2020   Procedure: CARDIOVERSION;  Surgeon:  Dorothy Spark, MD;  Location: [facility] ENDOSCOPY;  Service: Cardiovascular;  Laterality: N/A;  . cataract surgery    . CHOLECYSTECTOMY N/A 10/24/2014   Procedure: MICROSCOPIC  CHOLECYSTECTOMY;  Surgeon: Ralene Ok, MD;  Location: [place];  Service: General;  Laterality: N/A;  . COLONOSCOPY    . CORONARY ARTERY BYPASS GRAFT  07  . ESOPHAGEAL MANOMETRY N/A 08/02/2012   Procedure: ESOPHAGEAL MANOMETRY (EM);  Surgeon: Sable Feil, MD;  Location: [facility] ENDOSCOPY;  Service: Endoscopy;  Laterality: N/A;  . ESOPHAGOGASTRODUODENOSCOPY (EGD) WITH PROPOFOL N/A 04/07/2019   Procedure: ESOPHAGOGASTRODUODENOSCOPY (EGD) WITH PROPOFOL;  Surgeon: Milus Banister, MD;  Location:  [facility] ENDOSCOPY;  Service: Endoscopy;  Laterality: N/A;  . HEMOSTASIS CLIP PLACEMENT  04/07/2019   Procedure: HEMOSTASIS CLIP PLACEMENT;  Surgeon: Milus Banister, MD;  Location: [facility] ENDOSCOPY;  Service: Endoscopy;;  . HOT HEMOSTASIS N/A 04/07/2019   Procedure: HOT HEMOSTASIS (ARGON PLASMA COAGULATION/BICAP);  Surgeon: Milus Banister, MD;  Location: [facility];  Service: Endoscopy;  Laterality: N/A;  . POLYPECTOMY  04/07/2019   Procedure: POLYPECTOMY;  Surgeon: Milus Banister, MD;  Location: [facility] ENDOSCOPY;  Service: Endoscopy;;  . [name]  04/07/2019   Procedure: [name];  Surgeon: Milus Banister, MD;  Location: [facility] ENDOSCOPY;  Service: Endoscopy;;  . TEE WITHOUT CARDIOVERSION N/A 04/03/2020   Procedure: TRANSESOPHAGEAL ECHOCARDIOGRAM (TEE);  Surgeon: Dorothy Spark, MD;  Location: [facility] ENDOSCOPY;  Service: Cardiovascular;  Laterality: N/A;  . THYROID LOBECTOMY    . UPPER GASTROINTESTINAL ENDOSCOPY      Family History  Problem Relation Age of Onset  . Heart disease Mother   . Cholelithiasis Mother   . Prostate cancer Brother   . Colon cancer Brother   . Breast cancer Other        niece - breast cancer  . Diabetes Sister   . Lung cancer Father   . Lung cancer Sister   . Stomach cancer Cousin   . Esophageal cancer Neg Hx   . Rectal cancer Neg Hx     Social History   Socioeconomic History  . Marital status: Married    Spouse name: [name]  . Number of children: 0  . Years of education: 66  . Highest education level: Not on file  Occupational History  . Occupation: retired    Fish farm manager: RETIRED  Tobacco Use  . Smoking status: Never Smoker  . Smokeless tobacco: Never Used  Vaping Use  . Vaping Use: Never used  Substance and Sexual Activity  . Alcohol use: No    Alcohol/week: 0.0 standard drinks  . Drug use: No  . Sexual activity: Not on file  Other Topics Concern  . Not on file  Social History Narrative   [name], Married. Lives with husband. Had been a very  active woman.   Social Determinants of Health   Financial Resource Strain: Low Risk   . Difficulty of Paying Living Expenses: Not hard at all  Food Insecurity: Not on file  Transportation Needs: No Transportation Needs  . Lack of Transportation (Medical): No  . Lack of Transportation (Non-Medical): No  Physical Activity: Not on file  Stress: Not on file  Social Connections: Not on file  Intimate Partner Violence: Not on file    Outpatient Medications Prior to Visit  Medication Sig Dispense Refill  . acetaminophen (TYLENOL) 500 MG tablet Take 2 tablets (1,000 mg total) by mouth every 8 (eight) hours as needed for mild pain. 30 tablet 0  . atorvastatin (LIPITOR) 80  MG tablet Take 0.5 tablets (40 mg total) by mouth at bedtime. 45 tablet 0  . Calcium Carbonate-Vitamin D (CALTRATE 600+D PO) Take 1 tablet by mouth daily with lunch.     Marland Kitchen ELIQUIS 2.5 MG TABS tablet TAKE 1 TABLET BY MOUTH TWICE DAILY (Patient taking differently: Take 2.5 mg by mouth 2 (two) times daily.) 180 tablet 1  . furosemide (LASIX) 20 MG tablet Take 3 tablets (60 mg total) by mouth daily.    Marland Kitchen gabapentin (NEURONTIN) 100 MG capsule Take 1 capsule (100 mg total) by mouth 2 (two) times daily. 60 capsule 1  . Infant Care Products [place]) OINT Apply 1 application topically in the morning, at noon, and at bedtime.    Marland Kitchen ipratropium (ATROVENT) 0.03 % nasal spray Place 2 sprays into both nostrils every 12 (twelve) hours.    Marland Kitchen lactase (LACTAID) 3000 units tablet Take 3,000 Units by mouth every 6 (six) hours as needed (lactose Intolerance).    . lactose free nutrition (BOOST PLUS) LIQD Take 237 mLs by mouth 3 (three) times daily with meals.  0  . levothyroxine (SYNTHROID) 75 MCG tablet Take 1 tablet (75 mcg total) by mouth daily at 6 (six) AM.    . Lidocaine 4 % PTCH Place 1 patch onto the skin daily as needed (pain.).     Marland Kitchen Lidocaine-Menthol (LIDOCAINE-MENTHOL ROLL-ON) 4-1 % LIQD Apply 1 application topically every 8 (eight)  hours as needed (pain).    . Multiple Vitamins-Minerals (PRESERVISION AREDS 2 PO) Take 1 tablet by mouth 2 (two) times daily.    . nitroGLYCERIN (NITROSTAT) 0.4 MG SL tablet Place 0.4 mg under the tongue every 5 (five) minutes as needed for chest pain.    Marland Kitchen oxyCODONE (OXY IR/ROXICODONE) 5 MG immediate release tablet Take 1 tablet (5 mg total) by mouth every 6 (six) hours as needed for severe pain. 20 tablet 0  . Polyethyl Glycol-Propyl Glycol (SYSTANE FREE OP) Place 1 drop into both eyes every 8 (eight) hours as needed (dry eyes).    . polyethylene glycol (MIRALAX / GLYCOLAX) 17 g packet Take 17 g by mouth daily. 14 each 0  . sennosides-docusate sodium (SENOKOT-S) 8.6-50 MG tablet Take 1 tablet by mouth daily.    . sodium chloride (OCEAN) 0.65 % SOLN nasal spray Place 1 spray into both nostrils daily as needed for congestion.    . sucralfate (CARAFATE) 1 GM/10ML suspension TAKE 10 MLS (1 G TOTAL) BY MOUTH 2 (TWO) TIMES DAILY. (Patient taking differently: Take 1 g by mouth 2 (two) times daily.) 420 mL 1  . traZODone (DESYREL) 50 MG tablet Take 25 mg by mouth at bedtime.    . hyoscyamine (LEVSIN SL) 0.125 MG SL tablet Place 1 tablet (0.125 mg total) under the tongue every 6 (six) hours as needed. (Patient taking differently: Place 0.125 mg under the tongue every 6 (six) hours as needed for cramping.) 30 tablet 2  . saccharomyces boulardii (FLORASTOR) 250 MG capsule Take 250 mg by mouth daily as needed (digestion).     No facility-administered medications prior to visit.    Allergies  Allergen Reactions  . Actos [Pioglitazone] Swelling    Ankle swelling  . Sulfonamide Derivatives Swelling  . Adhesive [Tape] Other (See Comments)    WITH PROLONGED EXPOSURE (REDNESS/ITCHING)  . Codeine Nausea And Vomiting    ROS Review of Systems  Constitutional: Negative for chills and fever.  Respiratory: Negative for cough and shortness of breath.   Cardiovascular: Positive for leg swelling. Negative  for  chest pain and palpitations.  Genitourinary: Negative for dysuria.  Neurological: Negative for dizziness.      Objective:    Physical Exam Cardiovascular:     Rate and Rhythm: Normal rate and regular rhythm.  Pulmonary:     Effort: Pulmonary effort is normal.     Breath sounds: Normal breath sounds.  Musculoskeletal:     Comments: She has 1+ pitting edema left lower leg and trace right.  Skin:    Comments: She has approximately 1 cm superficial ulceration left heel posteriorly.  No significant erythema.  Superficial eschar which was removed.  No purulent drainage.  No foul odor.  Right foot reveals a couple of eschars dorsally.  No surrounding erythema.  She has good capillary refill in both feet.  Slightly scaly fairly well demarcated rash right anterior leg with no skin breakdown.  Neurological:     Mental Status: She is alert.     BP 130/80 (BP Location: Left Arm, Patient Position: Sitting, Cuff Size: Normal)   Pulse 73   Temp 98.3 F (36.8 C) (Oral)   Wt 98 lb (44.5 kg)   SpO2 97%   BMI 17.36 kg/m  Wt Readings from Last 3 Encounters:  09/25/20 98 lb (44.5 kg)  09/10/20 89 lb 4.8 oz (40.5 kg)  09/06/20 89 lb (40.4 kg)     Health Maintenance Due  Topic Date Due  . TETANUS/TDAP  Never done  . Zoster Vaccines- Shingrix (1 of 2) Never done    There are no preventive care reminders to display for this patient.  Lab Results  Component Value Date   TSH 5.65 (H) 08/31/2020   Lab Results  Component Value Date   WBC 4.5 09/06/2020   HGB 9.0 (L) 09/06/2020   HCT 30.0 (L) 09/06/2020   MCV 76.9 (L) 09/06/2020   PLT 209 09/06/2020   Lab Results  Component Value Date   NA 138 09/10/2020   K 3.5 09/10/2020   CO2 30 09/10/2020   GLUCOSE 149 (H) 09/10/2020   BUN 26 (H) 09/10/2020   CREATININE 1.75 (H) 09/10/2020   BILITOT 0.5 07/22/2020   ALKPHOS 78 07/22/2020   AST 36 07/22/2020   ALT 32 07/22/2020   PROT 5.5 (L) 07/22/2020   ALBUMIN 1.8 (L) 07/24/2020    CALCIUM 8.4 09/10/2020   ANIONGAP 5 07/28/2020   GFR 26.18 (L) 09/10/2020   Lab Results  Component Value Date   CHOL 131 05/23/2020   Lab Results  Component Value Date   HDL 64 05/23/2020   Lab Results  Component Value Date   LDLCALC 47 05/23/2020   Lab Results  Component Value Date   TRIG 115 05/23/2020   Lab Results  Component Value Date   CHOLHDL 2.0 05/23/2020   Lab Results  Component Value Date   HGBA1C 5.8 11/18/2016      Assessment & Plan:   #1 acute renal failure.  Creatinine has inched up from 0.89-1.75 from early April to May 16.  Recent ultrasound revealed no evidence for obstruction.  Suspect probably prerenal.  This does correlate during the time that she was treated with furosemide.  She did have contrast study back in her March hospitalization and prolonged antibiotics but suspect this is more likely prerenal  -Recheck basic metabolic panel -Check fractional excretion of sodium -Check urinalysis -Check urine microalbumin/creatinine ratio.  Doubt nephrotic syndrome. -Continue to avoid nonsteroidals -We will likely need to back off on her Lasix dose  #2 superficial ulcer  left heel.  No signs of secondary infection.  Does not have any risk factors for poor healing such as diabetes or peripheral vascular disease but does have history of poor nutrition -Set up podiatry referral -She has been using topical antibiotic and we recommend leaving off and cleaning daily with soap and water and using some topical Vaseline.  Avoid any tight fitting shoes  #3 increased peripheral edema probably largely related to diastolic dysfunction. - await repeat BMP -will try to reduce her Lasix dose - try to keep dietary sodium down.   #4 skin rash lower legs.  This appears to be more eczematous.  Recommend trial of triamcinolone 0.1% cream to use twice daily as needed  Meds ordered this encounter  Medications  . triamcinolone cream (KENALOG) 0.1 %    Sig: Apply 1  application topically 2 (two) times daily.    Dispense:  15 g    Refill:  1    Follow-up: No follow-ups on file.    Carolann Littler, MD

## 2020-09-25 NOTE — Patient Instructions (Signed)
Keep left heel cleaned with soap and water.  Leave off the antibiotic topically  May use some topical vaseline to left heel.   I will advice on the Furosemide after the labs.

## 2020-09-26 ENCOUNTER — Telehealth: Payer: Self-pay | Admitting: Family Medicine

## 2020-09-26 ENCOUNTER — Ambulatory Visit: Payer: Medicare Other | Admitting: Physician Assistant

## 2020-09-26 ENCOUNTER — Telehealth: Payer: Self-pay | Admitting: *Deleted

## 2020-09-26 LAB — URINALYSIS, ROUTINE W REFLEX MICROSCOPIC
Bilirubin Urine: NEGATIVE
Ketones, ur: NEGATIVE
Nitrite: POSITIVE — AB
Specific Gravity, Urine: 1.02 (ref 1.000–1.030)
Total Protein, Urine: NEGATIVE
Urine Glucose: NEGATIVE
Urobilinogen, UA: 0.2 (ref 0.0–1.0)
pH: 6 (ref 5.0–8.0)

## 2020-09-26 LAB — BASIC METABOLIC PANEL
BUN: 15 mg/dL (ref 6–23)
CO2: 25 mEq/L (ref 19–32)
Calcium: 8.8 mg/dL (ref 8.4–10.5)
Chloride: 106 mEq/L (ref 96–112)
Creatinine, Ser: 1.17 mg/dL (ref 0.40–1.20)
GFR: 42.43 mL/min — ABNORMAL LOW (ref >60.00)
Glucose, Bld: 102 mg/dL — ABNORMAL HIGH (ref 70–99)
Potassium: 4.7 mEq/L (ref 3.5–5.1)
Sodium: 140 mEq/L (ref 135–145)

## 2020-09-26 LAB — MICROALBUMIN / CREATININE URINE RATIO
Creatinine,U: 112.9 mg/dL
Microalb Creat Ratio: 2.5 mg/g (ref 0.0–30.0)
Microalb, Ur: 2.8 mg/dL — ABNORMAL HIGH (ref 0.0–1.9)

## 2020-09-26 LAB — CREATININE, URINE, RANDOM: Creatinine, Urine: 118 mg/dL (ref 20–275)

## 2020-09-26 LAB — SODIUM, URINE, RANDOM: Sodium, Ur: 114 mmol/L (ref 28–272)

## 2020-09-26 NOTE — Telephone Encounter (Signed)
Patient is calling and stated that she recently visited her cardiologist and they wanted provider to know that she is experiencing stomach issues that has followed by diarrhea. Per patient she was just seen yesterday and felt fine and wanted to see what the provider recommended, please advise. CB is [phone]

## 2020-09-26 NOTE — Telephone Encounter (Signed)
S/w pt due to pt R/S appt today with Richardson Dopp, PA. Pt has headache, diarrhea and loss of appetite. Pt was R/S with Dell Ponto, PA but does not want to see her.  Told pt to call and ask for Kadesha Virrueta and would find a spot for pt to be seen by Nicki Reaper. Advised pt to call PCP, Pt seen PCP yesterday but did not have theses symptoms. Pt has had prior gut issues.  Will send to [place] to [place].

## 2020-09-26 NOTE — Telephone Encounter (Signed)
Agree.  She had a recent admission for endocarditis and possible discitis followed by stay in SNF.  It would be best to make sure she does not have C.Diff.  I would like her to f/u with PCP or ID if needed. Richardson Dopp, PA-C    09/26/2020 5:19 PM

## 2020-09-26 NOTE — Progress Notes (Deleted)
Cardiology Office Note:    Date:  09/26/2020   ID:  [name], DOB 06/16/1932, MRN [id number]  PCP:  [name], MD   [facility] Providers Cardiologist:  Sherren Mocha, MD { Click to update primary MD,subspecialty MD or APP then REFRESH:1}  ***  Referring MD: [name], MD   Chief Complaint:  No chief complaint on file.    Patient Profile:    [name] is a 87 y.o. female with:   Coronary artery disease  ? S/p CABG in 2007  Chronic anemia  Mitral regurgitation, mod to severe ? Probably 2/2 ischemic MR w restriction of post leaflet ?   HFmrEF (heart failure with mildly reduced ejection fraction)  Carotid artery disease  Paroxysmal atrial fibrillation   Failed DCCV  Amiodarone >> sinus brady  +side effects; elevated LFTs >> dose reduced    Lumbar HNP   Compression fx  Hypertension   Hyperlipidemia   Hypothyroidism  Prior CV studies: Echocardiogram 07/23/2020 1. Left ventricular ejection fraction, by estimation, is 50%. The left  ventricle has mildly decreased function. The left ventricle demonstrates  regional wall motion abnormalities, septal-lateral dyssychrony consistent  with LBBB and mild septal  hypokinesis. Left ventricular diastolic parameters are consistent with  Grade II diastolic dysfunction (pseudonormalization).  2. Right ventricular systolic function is mildly reduced. The right  ventricular size is normal. There is mildly elevated pulmonary artery  systolic pressure.  3. There is a small (0.5 x 0.9 cm) mobile vegetation noted on the  ventricular size of the aortic valve. The aortic valve is tricuspid.  Aortic valve regurgitation is moderate to severe and eccentric, possible  holodiastolic flow reversal in descending  thoracic aorta (signal not great). No aortic stenosis is present.  4. The mitral valve is abnormal. Severe mitral valve regurgitation,  eccentric anterior jet likely due to P2  prolapse vs partial flail. No  evidence of mitral stenosis.  5. Left atrial size was mildly dilated.  6. The inferior vena cava is normal in size with greater than 50%  respiratory variability, suggesting right atrial pressure of 3 mmHg.  7. Pleural effusion noted on left.  8. Impression: Aortic valve endocarditis, mod-severe aortic  insufficiency, severe MR.   Echocardiogram 07/06/2020 1. Left ventricular ejection fraction, by estimation, is 40 to 45%. The  left ventricle has mildly decreased function. The left ventricle  demonstrates regional wall motion abnormalities. Abnormal (paradoxical)  septal motion, consistent with left bundle  branch block There is mild left ventricular hypertrophy. Left ventricular  diastolic parameters are indeterminate.  2. Right ventricular systolic function is mildly reduced. The right  ventricular size is normal. There is moderately elevated pulmonary artery  systolic pressure. The estimated right ventricular systolic pressure is  09.7 mmHg.  3. Left atrial size was moderately dilated.  4. Right atrial size was mildly dilated.  5. The mitral valve is abnormal. Moderate to severe mitral valve  regurgitation.  6. There is an aortic valve vegetation measuring 1.5cm x 0.5cm. The  aortic valve is tricuspid. No aortic stenosis is present. Increased  gradients through AV likely due to AI, visually does not appear stenotic.  Aortic valve regurgitation is moderate to  severe.   TEE 04/03/20 Severe MR, mod pulm hypertension (RVSP 47), EF 50-55, no RWMA, normal RVSF, severe BAE, mod TR, mod to severe AI  Echocardiogram 02/16/20 EF 45-50, normal RVSF, RVSP 31.1, mild BAE, restriction of post MV leaflet, severe eccentric ant direted MR, mild AI  Carotid US 12/29/17 Bilat ICA 1-39   History of Present Illness: [name] was last seen by Dr. Burt Knack in 2/22.  She was maintaining sinus rhythm.  She had issues with poor appetite and changes in her  taste related to amiodarone.  She also had elevated LFTs.  Her statin dose was reduced.  At her last visit amiodarone dose was reduced to 100 mg daily due to side effects and elevated LFTs.  It was also felt that ongoing surveillance was most appropriate for her mitral regurgitation given her multiple comorbid illnesses.   ***        Past Medical History:  Diagnosis Date  . Allergy   . Anemia, pernicious   . Arthritis    bursitis   . Atrophic gastritis   . B12 deficiency   . Bacteremia due to Enterococcus 07/09/2020  . CAD (coronary artery disease)   . Carotid artery stenosis   . Cataract   . Diarrhea    on and off for months  . Enterococcus faecalis infection 09/06/2020  . Family history of adverse reaction to anesthesia    extreme nausea brother and sister  . Fibromyalgia   . Gallbladder polyp   . GERD (gastroesophageal reflux disease)   . Heart murmur   . History of hiatal hernia   . History of kidney stones   . HLD (hyperlipidemia)   . HTN (hypertension)   . Hypothyroidism   . Irritable bowel syndrome (IBS)   . Median arcuate ligament syndrome ([place])   . Osteoporosis   . PVD (peripheral vascular disease) ([place])   . Tubular adenoma of colon 08/2013   with focal high grade dysplasia    Current Medications: No outpatient medications have been marked as taking for the 09/26/20 encounter (Appointment) with Richardson Dopp T, PA-C.     Allergies:   Actos [pioglitazone], Sulfonamide derivatives, Adhesive [tape], and Codeine   Social History   Tobacco Use  . Smoking status: Never Smoker  . Smokeless tobacco: Never Used  Vaping Use  . Vaping Use: Never used  Substance Use Topics  . Alcohol use: No    Alcohol/week: 0.0 standard drinks  . Drug use: No     Family Hx: The patient's family history includes Breast cancer in an other family member; Cholelithiasis in her mother; Colon cancer in her brother; Diabetes in her sister; Heart disease in her mother; Lung cancer in her  father and sister; Prostate cancer in her brother; Stomach cancer in her cousin. There is no history of Esophageal cancer or Rectal cancer.  ROS   EKGs/Labs/Other Test Reviewed:    EKG:  EKG is *** ordered today.  The ekg ordered today demonstrates ***  Recent Labs: 07/22/2020: ALT 32; B Natriuretic Peptide 640.7 07/26/2020: Magnesium 1.9 08/31/2020: TSH 5.65 09/06/2020: Hemoglobin 9.0; Platelets 209 09/10/2020: BUN 26; Creatinine, Ser 1.75; Potassium 3.5; Sodium 138   Recent Lipid Panel Lab Results  Component Value Date/Time   CHOL 131 05/23/2020 02:31 PM   TRIG 115 05/23/2020 02:31 PM   TRIG 85 02/05/2006 08:18 AM   HDL 64 05/23/2020 02:31 PM   LDLCALC 47 05/23/2020 02:31 PM      Risk Assessment/Calculations:   {Does this patient have ATRIAL FIBRILLATION?:[id number]}  Physical Exam:    VS:  There were no vitals taken for this visit.    Wt Readings from Last 3 Encounters:  09/25/20 98 lb (44.5 kg)  09/10/20 89 lb 4.8 oz (40.5 kg)  09/06/20 89 lb (40.4 kg)  Physical Exam ***     ASSESSMENT & PLAN:    ***  1. Persistent atrial fibrillation ([place]) The patient noted significant improvement in her symptoms after cardioversion. She is back in atrial fibrillation today and has had recurrent symptoms. She clearly feels better in normal sinus rhythm. I reviewed options with her for antiarrhythmic drug therapy. These include amiodarone and dofetilide. I have recommended amiodarone to her today. Her husband already takes this medication. I reviewed this with Dr. Burt Knack as well. He agreed with initiating amiodarone. It will be difficult to keep her in normal sinus rhythm with her severe mitral regurgitation. Intervention for her mitral regurgitation may be needed at some point in the near future.             -Start amiodarone 400 mg twice daily             -Plan reducing dose to 200 mg daily after cardioversion             -Plan repeat cardioversion in 2 weeks             -Nurse  visit prior to cardioversion to check EKG and obtain BMET             -She knows to contact us if she cannot tolerate high-dose amiodarone  2. HFmrEF (heart failure with mildly reduced ejection fraction)   Overall, volume status is stable. Continue current dose of losartan, spironolactone, metoprolol succinate.  3. Nonrheumatic mitral valve regurgitation Severe mitral regurgitation by recent TEE. TEE also mentions moderate to severe aortic insufficiency. She has had mild aortic insufficiency in the past and she does not really have a significant murmur on exam to suggest AI. She will see Dr. Burt Knack back in follow-up after her cardioversion for continued management of her mild regurgitation.  4. Coronary artery disease involving native coronary artery of native heart without angina pectoris History of CABG in 2007. She did have chest discomfort recently but this was atypical for angina. Continue current dose of atorvastatin, metoprolol succinate. She knows to contact us if her symptoms should change or worsen.  {Are you ordering a CV Procedure (e.g. stress test, cath, DCCV, TEE, etc)?   Press F2        :[id number]}    Dispo:  No follow-ups on file.   Medication Adjustments/Labs and Tests Ordered: Current medicines are reviewed at length with the patient today.  Concerns regarding medicines are outlined above.  Tests Ordered: No orders of the defined types were placed in this encounter.  Medication Changes: No orders of the defined types were placed in this encounter.   Signed, Richardson Dopp, PA-C  09/26/2020 7:55 AM    [facility] [address], [place], [place]  [address] Phone: [phone]; Fax: [phone]

## 2020-09-27 ENCOUNTER — Emergency Department (HOSPITAL_COMMUNITY): Payer: Medicare Other

## 2020-09-27 ENCOUNTER — Inpatient Hospital Stay (HOSPITAL_COMMUNITY)
Admission: EM | Admit: 2020-09-27 | Discharge: 2020-10-26 | DRG: 871 | Disposition: E | Payer: Medicare Other | Attending: Internal Medicine | Admitting: Internal Medicine

## 2020-09-27 DIAGNOSIS — Z20822 Contact with and (suspected) exposure to covid-19: Secondary | ICD-10-CM | POA: Diagnosis not present

## 2020-09-27 DIAGNOSIS — K219 Gastro-esophageal reflux disease without esophagitis: Secondary | ICD-10-CM | POA: Diagnosis not present

## 2020-09-27 DIAGNOSIS — I2510 Atherosclerotic heart disease of native coronary artery without angina pectoris: Secondary | ICD-10-CM | POA: Diagnosis present

## 2020-09-27 DIAGNOSIS — I499 Cardiac arrhythmia, unspecified: Secondary | ICD-10-CM | POA: Diagnosis not present

## 2020-09-27 DIAGNOSIS — I517 Cardiomegaly: Secondary | ICD-10-CM | POA: Diagnosis not present

## 2020-09-27 DIAGNOSIS — Z515 Encounter for palliative care: Secondary | ICD-10-CM | POA: Diagnosis not present

## 2020-09-27 DIAGNOSIS — A409 Streptococcal sepsis, unspecified: Principal | ICD-10-CM | POA: Diagnosis present

## 2020-09-27 DIAGNOSIS — Z66 Do not resuscitate: Secondary | ICD-10-CM | POA: Diagnosis not present

## 2020-09-27 DIAGNOSIS — R7989 Other specified abnormal findings of blood chemistry: Secondary | ICD-10-CM | POA: Diagnosis present

## 2020-09-27 DIAGNOSIS — I4819 Other persistent atrial fibrillation: Secondary | ICD-10-CM | POA: Diagnosis not present

## 2020-09-27 DIAGNOSIS — Z800 Family history of malignant neoplasm of digestive organs: Secondary | ICD-10-CM

## 2020-09-27 DIAGNOSIS — F0390 Unspecified dementia without behavioral disturbance: Secondary | ICD-10-CM | POA: Diagnosis present

## 2020-09-27 DIAGNOSIS — Z681 Body mass index (BMI) 19 or less, adult: Secondary | ICD-10-CM | POA: Diagnosis not present

## 2020-09-27 DIAGNOSIS — R6521 Severe sepsis with septic shock: Secondary | ICD-10-CM | POA: Diagnosis not present

## 2020-09-27 DIAGNOSIS — R109 Unspecified abdominal pain: Secondary | ICD-10-CM | POA: Diagnosis not present

## 2020-09-27 DIAGNOSIS — R7401 Elevation of levels of liver transaminase levels: Secondary | ICD-10-CM | POA: Diagnosis present

## 2020-09-27 DIAGNOSIS — R001 Bradycardia, unspecified: Secondary | ICD-10-CM | POA: Diagnosis not present

## 2020-09-27 DIAGNOSIS — R6520 Severe sepsis without septic shock: Secondary | ICD-10-CM

## 2020-09-27 DIAGNOSIS — R8281 Pyuria: Secondary | ICD-10-CM | POA: Diagnosis present

## 2020-09-27 DIAGNOSIS — I10 Essential (primary) hypertension: Secondary | ICD-10-CM | POA: Diagnosis not present

## 2020-09-27 DIAGNOSIS — I330 Acute and subacute infective endocarditis: Secondary | ICD-10-CM | POA: Diagnosis present

## 2020-09-27 DIAGNOSIS — R778 Other specified abnormalities of plasma proteins: Secondary | ICD-10-CM | POA: Diagnosis not present

## 2020-09-27 DIAGNOSIS — N1832 Chronic kidney disease, stage 3b: Secondary | ICD-10-CM | POA: Diagnosis present

## 2020-09-27 DIAGNOSIS — R404 Transient alteration of awareness: Secondary | ICD-10-CM | POA: Diagnosis not present

## 2020-09-27 DIAGNOSIS — R17 Unspecified jaundice: Secondary | ICD-10-CM | POA: Diagnosis not present

## 2020-09-27 DIAGNOSIS — J9621 Acute and chronic respiratory failure with hypoxia: Secondary | ICD-10-CM | POA: Diagnosis not present

## 2020-09-27 DIAGNOSIS — J9601 Acute respiratory failure with hypoxia: Secondary | ICD-10-CM

## 2020-09-27 DIAGNOSIS — Z8042 Family history of malignant neoplasm of prostate: Secondary | ICD-10-CM

## 2020-09-27 DIAGNOSIS — Z951 Presence of aortocoronary bypass graft: Secondary | ICD-10-CM | POA: Diagnosis not present

## 2020-09-27 DIAGNOSIS — Z79890 Hormone replacement therapy (postmenopausal): Secondary | ICD-10-CM

## 2020-09-27 DIAGNOSIS — G319 Degenerative disease of nervous system, unspecified: Secondary | ICD-10-CM | POA: Diagnosis not present

## 2020-09-27 DIAGNOSIS — E785 Hyperlipidemia, unspecified: Secondary | ICD-10-CM | POA: Diagnosis present

## 2020-09-27 DIAGNOSIS — R64 Cachexia: Secondary | ICD-10-CM | POA: Diagnosis not present

## 2020-09-27 DIAGNOSIS — G9341 Metabolic encephalopathy: Secondary | ICD-10-CM | POA: Diagnosis not present

## 2020-09-27 DIAGNOSIS — A419 Sepsis, unspecified organism: Secondary | ICD-10-CM | POA: Diagnosis not present

## 2020-09-27 DIAGNOSIS — I6782 Cerebral ischemia: Secondary | ICD-10-CM | POA: Diagnosis not present

## 2020-09-27 DIAGNOSIS — D631 Anemia in chronic kidney disease: Secondary | ICD-10-CM | POA: Diagnosis not present

## 2020-09-27 DIAGNOSIS — Z888 Allergy status to other drugs, medicaments and biological substances status: Secondary | ICD-10-CM

## 2020-09-27 DIAGNOSIS — R570 Cardiogenic shock: Secondary | ICD-10-CM | POA: Diagnosis not present

## 2020-09-27 DIAGNOSIS — E872 Acidosis: Secondary | ICD-10-CM | POA: Diagnosis present

## 2020-09-27 DIAGNOSIS — I5043 Acute on chronic combined systolic (congestive) and diastolic (congestive) heart failure: Secondary | ICD-10-CM | POA: Diagnosis present

## 2020-09-27 DIAGNOSIS — Z885 Allergy status to narcotic agent status: Secondary | ICD-10-CM

## 2020-09-27 DIAGNOSIS — Z8249 Family history of ischemic heart disease and other diseases of the circulatory system: Secondary | ICD-10-CM

## 2020-09-27 DIAGNOSIS — K5730 Diverticulosis of large intestine without perforation or abscess without bleeding: Secondary | ICD-10-CM | POA: Diagnosis not present

## 2020-09-27 DIAGNOSIS — E039 Hypothyroidism, unspecified: Secondary | ICD-10-CM | POA: Diagnosis present

## 2020-09-27 DIAGNOSIS — I480 Paroxysmal atrial fibrillation: Secondary | ICD-10-CM

## 2020-09-27 DIAGNOSIS — J90 Pleural effusion, not elsewhere classified: Secondary | ICD-10-CM | POA: Diagnosis not present

## 2020-09-27 DIAGNOSIS — D509 Iron deficiency anemia, unspecified: Secondary | ICD-10-CM | POA: Diagnosis present

## 2020-09-27 DIAGNOSIS — Z91048 Other nonmedicinal substance allergy status: Secondary | ICD-10-CM

## 2020-09-27 DIAGNOSIS — I080 Rheumatic disorders of both mitral and aortic valves: Secondary | ICD-10-CM | POA: Diagnosis present

## 2020-09-27 DIAGNOSIS — R0689 Other abnormalities of breathing: Secondary | ICD-10-CM | POA: Diagnosis not present

## 2020-09-27 DIAGNOSIS — I130 Hypertensive heart and chronic kidney disease with heart failure and stage 1 through stage 4 chronic kidney disease, or unspecified chronic kidney disease: Secondary | ICD-10-CM | POA: Diagnosis present

## 2020-09-27 DIAGNOSIS — Z743 Need for continuous supervision: Secondary | ICD-10-CM | POA: Diagnosis not present

## 2020-09-27 DIAGNOSIS — Z79899 Other long term (current) drug therapy: Secondary | ICD-10-CM

## 2020-09-27 DIAGNOSIS — B952 Enterococcus as the cause of diseases classified elsewhere: Secondary | ICD-10-CM | POA: Diagnosis present

## 2020-09-27 DIAGNOSIS — R0902 Hypoxemia: Secondary | ICD-10-CM | POA: Diagnosis not present

## 2020-09-27 DIAGNOSIS — Z7189 Other specified counseling: Secondary | ICD-10-CM

## 2020-09-27 DIAGNOSIS — J321 Chronic frontal sinusitis: Secondary | ICD-10-CM | POA: Diagnosis not present

## 2020-09-27 DIAGNOSIS — Z803 Family history of malignant neoplasm of breast: Secondary | ICD-10-CM

## 2020-09-27 DIAGNOSIS — M4640 Discitis, unspecified, site unspecified: Secondary | ICD-10-CM | POA: Diagnosis present

## 2020-09-27 DIAGNOSIS — Z7901 Long term (current) use of anticoagulants: Secondary | ICD-10-CM

## 2020-09-27 DIAGNOSIS — Z801 Family history of malignant neoplasm of trachea, bronchus and lung: Secondary | ICD-10-CM

## 2020-09-27 DIAGNOSIS — E43 Unspecified severe protein-calorie malnutrition: Secondary | ICD-10-CM | POA: Diagnosis present

## 2020-09-27 DIAGNOSIS — I447 Left bundle-branch block, unspecified: Secondary | ICD-10-CM | POA: Diagnosis present

## 2020-09-27 DIAGNOSIS — K2940 Chronic atrophic gastritis without bleeding: Secondary | ICD-10-CM | POA: Diagnosis present

## 2020-09-27 DIAGNOSIS — R509 Fever, unspecified: Secondary | ICD-10-CM | POA: Diagnosis not present

## 2020-09-27 DIAGNOSIS — N200 Calculus of kidney: Secondary | ICD-10-CM | POA: Diagnosis not present

## 2020-09-27 DIAGNOSIS — Z882 Allergy status to sulfonamides status: Secondary | ICD-10-CM

## 2020-09-27 DIAGNOSIS — Z833 Family history of diabetes mellitus: Secondary | ICD-10-CM

## 2020-09-27 DIAGNOSIS — R000 Tachycardia, unspecified: Secondary | ICD-10-CM | POA: Diagnosis present

## 2020-09-27 DIAGNOSIS — R197 Diarrhea, unspecified: Secondary | ICD-10-CM | POA: Diagnosis not present

## 2020-09-27 LAB — TROPONIN I (HIGH SENSITIVITY)
Troponin I (High Sensitivity): 588 ng/L (ref <18)
Troponin I (High Sensitivity): 724 ng/L (ref <18)

## 2020-09-27 LAB — CBC WITH DIFFERENTIAL/PLATELET
Abs Immature Granulocytes: 0.08 10*3/uL — ABNORMAL HIGH (ref 0.00–0.07)
Basophils Absolute: 0 10*3/uL (ref 0.0–0.1)
Basophils Relative: 0 %
Eosinophils Absolute: 0 10*3/uL (ref 0.0–0.5)
Eosinophils Relative: 0 %
HCT: 30.6 % — ABNORMAL LOW (ref 36.0–46.0)
Hemoglobin: 8.9 g/dL — ABNORMAL LOW (ref 12.0–15.0)
Immature Granulocytes: 1 %
Lymphocytes Relative: 2 %
Lymphs Abs: 0.3 10*3/uL — ABNORMAL LOW (ref 0.7–4.0)
MCH: 22.8 pg — ABNORMAL LOW (ref 26.0–34.0)
MCHC: 29.1 g/dL — ABNORMAL LOW (ref 30.0–36.0)
MCV: 78.5 fL — ABNORMAL LOW (ref 80.0–100.0)
Monocytes Absolute: 0.6 10*3/uL (ref 0.1–1.0)
Monocytes Relative: 5 %
Neutro Abs: 11.4 10*3/uL — ABNORMAL HIGH (ref 1.7–7.7)
Neutrophils Relative %: 92 %
Platelets: 201 10*3/uL (ref 150–400)
RBC: 3.9 MIL/uL (ref 3.87–5.11)
RDW: 19 % — ABNORMAL HIGH (ref 11.5–15.5)
WBC: 12.4 10*3/uL — ABNORMAL HIGH (ref 4.0–10.5)
nRBC: 0 % (ref 0.0–0.2)

## 2020-09-27 LAB — COMPREHENSIVE METABOLIC PANEL
ALT: 23 U/L (ref 0–44)
AST: 42 U/L — ABNORMAL HIGH (ref 15–41)
Albumin: 3.1 g/dL — ABNORMAL LOW (ref 3.5–5.0)
Alkaline Phosphatase: 67 U/L (ref 38–126)
Anion gap: 14 (ref 5–15)
BUN: 15 mg/dL (ref 8–23)
CO2: 19 mmol/L — ABNORMAL LOW (ref 22–32)
Calcium: 8.6 mg/dL — ABNORMAL LOW (ref 8.9–10.3)
Chloride: 103 mmol/L (ref 98–111)
Creatinine, Ser: 1.41 mg/dL — ABNORMAL HIGH (ref 0.44–1.00)
GFR, Estimated: 37 mL/min — ABNORMAL LOW (ref >60)
Glucose, Bld: 162 mg/dL — ABNORMAL HIGH (ref 70–99)
Potassium: 4.5 mmol/L (ref 3.5–5.1)
Sodium: 136 mmol/L (ref 135–145)
Total Bilirubin: 0.6 mg/dL (ref 0.3–1.2)
Total Protein: 6.5 g/dL (ref 6.5–8.1)

## 2020-09-27 LAB — RESP PANEL BY RT-PCR (FLU A&B, COVID) ARPGX2
Influenza A by PCR: NEGATIVE
Influenza B by PCR: NEGATIVE
SARS Coronavirus 2 by RT PCR: NEGATIVE

## 2020-09-27 LAB — LACTIC ACID, PLASMA
Lactic Acid, Venous: 4.1 mmol/L (ref 0.5–1.9)
Lactic Acid, Venous: 2.8 mmol/L (ref 0.5–1.9)

## 2020-09-27 LAB — PROTIME-INR
INR: 1.4 — ABNORMAL HIGH (ref 0.8–1.2)
Prothrombin Time: 17.3 seconds — ABNORMAL HIGH (ref 11.4–15.2)

## 2020-09-27 LAB — BRAIN NATRIURETIC PEPTIDE: B Natriuretic Peptide: 1828.2 pg/mL — ABNORMAL HIGH (ref 0.0–100.0)

## 2020-09-27 MED ORDER — OXYCODONE HCL 5 MG PO TABS: 5.0000 mg | ORAL_TABLET | Freq: Four times a day (QID) | ORAL | Status: DC | PRN

## 2020-09-27 MED ORDER — HALOPERIDOL LACTATE 2 MG/ML PO CONC
0.5000 mg | ORAL | Status: DC | PRN
  Filled 2020-09-27: qty 0.3

## 2020-09-27 MED ORDER — ACETAMINOPHEN 650 MG RE SUPP: 650.0000 mg | Freq: Four times a day (QID) | RECTAL | Status: DC | PRN

## 2020-09-27 MED ORDER — SODIUM CHLORIDE 0.9 % IV SOLN
2.0000 g | Freq: Two times a day (BID) | INTRAVENOUS | Status: DC
  Administered 2020-09-27: 2 g via INTRAVENOUS
  Filled 2020-09-27: qty 2

## 2020-09-27 MED ORDER — ACETAMINOPHEN 325 MG PO TABS: 650.0000 mg | ORAL_TABLET | Freq: Four times a day (QID) | ORAL | Status: DC | PRN

## 2020-09-27 MED ORDER — SODIUM CHLORIDE 0.9% FLUSH: 3.0000 mL | Freq: Two times a day (BID) | INTRAVENOUS | Status: DC

## 2020-09-27 MED ORDER — METOPROLOL SUCCINATE ER 25 MG PO TB24
25.0000 mg | ORAL_TABLET | Freq: Every day | ORAL | Status: DC
  Administered 2020-09-27: 25 mg via ORAL
  Filled 2020-09-27: qty 1

## 2020-09-27 MED ORDER — ALBUTEROL SULFATE (2.5 MG/3ML) 0.083% IN NEBU: 2.5000 mg | INHALATION_SOLUTION | Freq: Four times a day (QID) | RESPIRATORY_TRACT | Status: DC | PRN

## 2020-09-27 MED ORDER — LACTATED RINGERS IV BOLUS (SEPSIS)
1000.0000 mL | Freq: Once | INTRAVENOUS | Status: AC
  Administered 2020-09-27: 1000 mL via INTRAVENOUS

## 2020-09-27 MED ORDER — ACETAMINOPHEN 650 MG RE SUPP
650.0000 mg | Freq: Once | RECTAL | Status: AC
  Administered 2020-09-27: 650 mg via RECTAL
  Filled 2020-09-27: qty 1

## 2020-09-27 MED ORDER — BIOTENE DRY MOUTH MT LIQD: 15.0000 mL | OROMUCOSAL | Status: DC | PRN

## 2020-09-27 MED ORDER — SODIUM CHLORIDE 0.9 % IV BOLUS (SEPSIS)
500.0000 mL | Freq: Once | INTRAVENOUS | Status: AC
  Administered 2020-09-27: 500 mL via INTRAVENOUS

## 2020-09-27 MED ORDER — HALOPERIDOL LACTATE 5 MG/ML IJ SOLN: 0.5000 mg | INTRAMUSCULAR | Status: DC | PRN

## 2020-09-27 MED ORDER — LIDOCAINE 5 % EX PTCH: 1.0000 | MEDICATED_PATCH | Freq: Every day | CUTANEOUS | Status: DC | PRN

## 2020-09-27 MED ORDER — LORAZEPAM 2 MG/ML PO CONC: 1.0000 mg | ORAL | Status: DC | PRN

## 2020-09-27 MED ORDER — LEVOTHYROXINE SODIUM 25 MCG PO TABS: 50.0000 ug | ORAL_TABLET | Freq: Every day | ORAL | Status: DC

## 2020-09-27 MED ORDER — ATORVASTATIN CALCIUM 40 MG PO TABS: 40.0000 mg | ORAL_TABLET | Freq: Every day | ORAL | Status: DC

## 2020-09-27 MED ORDER — LORAZEPAM 1 MG PO TABS: 1.0000 mg | ORAL_TABLET | ORAL | Status: DC | PRN

## 2020-09-27 MED ORDER — VANCOMYCIN HCL 500 MG/100ML IV SOLN: 500.0000 mg | INTRAVENOUS | Status: DC

## 2020-09-27 MED ORDER — HALOPERIDOL 0.5 MG PO TABS
0.5000 mg | ORAL_TABLET | ORAL | Status: DC | PRN
  Filled 2020-09-27: qty 1

## 2020-09-27 MED ORDER — SODIUM CHLORIDE 0.9 % IV SOLN: INTRAVENOUS | Status: DC

## 2020-09-27 MED ORDER — SODIUM CHLORIDE 0.9 % IV BOLUS
1000.0000 mL | Freq: Once | INTRAVENOUS | Status: AC
  Administered 2020-09-27: 1000 mL via INTRAVENOUS

## 2020-09-27 MED ORDER — METRONIDAZOLE 500 MG/100ML IV SOLN
500.0000 mg | Freq: Three times a day (TID) | INTRAVENOUS | Status: DC
  Administered 2020-09-27: 500 mg via INTRAVENOUS
  Filled 2020-09-27: qty 100

## 2020-09-27 MED ORDER — MORPHINE BOLUS VIA INFUSION
0.5000 mg | INTRAVENOUS | Status: DC | PRN
  Administered 2020-09-27: 0.5 mg via INTRAVENOUS
  Filled 2020-09-27: qty 1

## 2020-09-27 MED ORDER — GLYCOPYRROLATE 1 MG PO TABS
1.0000 mg | ORAL_TABLET | ORAL | Status: DC | PRN
  Filled 2020-09-27: qty 1

## 2020-09-27 MED ORDER — LORAZEPAM 2 MG/ML IJ SOLN: 1.0000 mg | INTRAMUSCULAR | Status: DC | PRN

## 2020-09-27 MED ORDER — SALINE SPRAY 0.65 % NA SOLN
1.0000 | Freq: Every day | NASAL | Status: DC | PRN
  Filled 2020-09-27 (×2): qty 44

## 2020-09-27 MED ORDER — GABAPENTIN 100 MG PO CAPS
100.0000 mg | ORAL_CAPSULE | Freq: Two times a day (BID) | ORAL | Status: DC
  Administered 2020-09-27: 100 mg via ORAL
  Filled 2020-09-27: qty 1

## 2020-09-27 MED ORDER — ONDANSETRON HCL 4 MG/2ML IJ SOLN: 4.0000 mg | Freq: Four times a day (QID) | INTRAMUSCULAR | Status: DC | PRN

## 2020-09-27 MED ORDER — GLYCOPYRROLATE 0.2 MG/ML IJ SOLN: 0.2000 mg | INTRAMUSCULAR | Status: DC | PRN

## 2020-09-27 MED ORDER — POLYVINYL ALCOHOL 1.4 % OP SOLN
1.0000 [drp] | Freq: Four times a day (QID) | OPHTHALMIC | Status: DC | PRN
  Filled 2020-09-27: qty 15

## 2020-09-27 MED ORDER — LACTATED RINGERS IV BOLUS (SEPSIS): 500.0000 mL | Freq: Once | INTRAVENOUS | Status: DC

## 2020-09-27 MED ORDER — ALBUTEROL SULFATE (2.5 MG/3ML) 0.083% IN NEBU: 2.5000 mg | INHALATION_SOLUTION | RESPIRATORY_TRACT | Status: DC | PRN

## 2020-09-27 MED ORDER — APIXABAN 2.5 MG PO TABS
2.5000 mg | ORAL_TABLET | Freq: Two times a day (BID) | ORAL | Status: DC
  Administered 2020-09-27: 2.5 mg via ORAL
  Filled 2020-09-27 (×2): qty 1

## 2020-09-27 MED ORDER — VANCOMYCIN HCL 1000 MG/200ML IV SOLN
1000.0000 mg | Freq: Once | INTRAVENOUS | Status: AC
  Administered 2020-09-27: 1000 mg via INTRAVENOUS
  Filled 2020-09-27: qty 200

## 2020-09-27 MED ORDER — BOOST PLUS PO LIQD
237.0000 mL | Freq: Three times a day (TID) | ORAL | Status: DC
  Administered 2020-09-27: 237 mL via ORAL
  Filled 2020-09-27: qty 237

## 2020-09-27 MED ORDER — ONDANSETRON HCL 4 MG/2ML IJ SOLN
4.0000 mg | Freq: Four times a day (QID) | INTRAMUSCULAR | Status: DC | PRN
  Administered 2020-09-27: 4 mg via INTRAVENOUS
  Filled 2020-09-27: qty 2

## 2020-09-27 MED ORDER — ONDANSETRON 4 MG PO TBDP: 4.0000 mg | ORAL_TABLET | Freq: Four times a day (QID) | ORAL | Status: DC | PRN

## 2020-09-27 MED ORDER — DERMACLOUD EX OINT: 1.0000 "application " | TOPICAL_OINTMENT | Freq: Two times a day (BID) | CUTANEOUS | Status: DC | PRN

## 2020-09-27 MED ORDER — LIDOCAINE-MENTHOL 4-1 % EX LIQD: 1.0000 "application " | Freq: Three times a day (TID) | CUTANEOUS | Status: DC | PRN

## 2020-09-27 MED ORDER — MORPHINE 100MG IN NS 100ML (1MG/ML) PREMIX INFUSION
1.0000 mg/h | INTRAVENOUS | Status: DC
  Administered 2020-09-27: 1 mg/h via INTRAVENOUS
  Filled 2020-09-27: qty 100

## 2020-09-27 MED ORDER — ATROPINE SULFATE 1 MG/10ML IJ SOSY
0.5000 mg | PREFILLED_SYRINGE | Freq: Once | INTRAMUSCULAR | Status: AC
  Administered 2020-09-27: 1 mg via INTRAVENOUS

## 2020-09-27 MED ORDER — TRAZODONE HCL 50 MG PO TABS: 25.0000 mg | ORAL_TABLET | Freq: Every evening | ORAL | Status: DC | PRN

## 2020-09-27 NOTE — Progress Notes (Signed)
This chaplain responded to PMT consult for EOL spiritual care.  The Pt. family: husband-David and sister-Ann are at the bedside. The family agrees with the Pt. is resting comfortably on her side.    [name] shares he and the Pt. share 21 years of marriage together.  [name] found his place at beside in the recliner.    The family accepted the chaplain's invitation for prayer and F/U spiritual care as needed. The chaplain understands the family is updating the Pt. clergy.

## 2020-09-27 NOTE — ED Notes (Signed)
Tamera Punt, MD and Katrina, RN made aware of lactic acid of 4.1.

## 2020-09-27 NOTE — Sepsis Progress Note (Signed)
elink monitoring code sepsis.  

## 2020-09-27 NOTE — Telephone Encounter (Signed)
Noted  

## 2020-09-27 NOTE — ED Notes (Signed)
Pt less responsive, HR dropping into 30's-20's. Family at bedside. Pt received 1L NS. Notified MD

## 2020-09-27 NOTE — Progress Notes (Signed)
Pharmacy Antibiotic Note  [name] is a 87 y.o. female admitted on 10/16/2020 with sepsis.  Pharmacy has been consulted for Vanc and cefepime dosing. CrCl 20 ml/min  Plan: Vanc 1 gm IV x 1, then 500 mg IV q24hr Cefepime 2 gms IV q12hr Flagyl 500 mg IV q8hr per MD Monitor renal function, clinical status, C&S and vanc levels as needed.   Temp (24hrs), Avg:100 F (37.8 C), Min:98.5 F (36.9 C), Max:104.1 F (40.1 C)  Recent Labs  Lab 09/25/20 1511 10/19/2020 0701 10/11/2020 0900  WBC  --  12.4*  --   CREATININE 1.17 1.41*  --   LATICACIDVEN  --  4.1* 2.8*    Estimated Creatinine Clearance: 20.5 mL/min (A) (by C-G formula based on SCr of 1.41 mg/dL (H)).    Allergies  Allergen Reactions  . Actos [Pioglitazone] Swelling    Ankle swelling  . Sulfonamide Derivatives Swelling  . Adhesive [Tape] Other (See Comments)    WITH PROLONGED EXPOSURE (REDNESS/ITCHING)  . Codeine Nausea And Vomiting    Antimicrobials this admission: Vanc 6/2 >>  Cefep 6/2 >>  Flagyl 6/2 >>  Thank you for allowing pharmacy to be a part of this patient's care.  Alanda Slim, PharmD, [facility] Clinical Pharmacist Please see AMION for all Pharmacists' Contact Phone Numbers 10/21/2020, 11:22 AM

## 2020-09-27 NOTE — ED Triage Notes (Signed)
Pt EMS arrival from home possible sepsis fever, diarrhea, capnography 20, SPO2 80% RA, GCS 11 currently on 6L O2, alert only to verbal with short simple responses. Pt hx dementia, pt spouse at home. Per EMS recently seen for infection to foot. 20 L AC.

## 2020-09-27 NOTE — ED Provider Notes (Signed)
[facility] EMERGENCY DEPARTMENT Provider Note   CSN: [id number] Arrival date & time: 10/11/2020  1448     History Chief Complaint  Patient presents with  . Altered Mental Status  . Code Sepsis    [name] is a 87 y.o. female.  Patient is a 87 year old female with a history of dementia, hypertension, GERD, coronary artery disease who presents with altered mental status.  She arrives from home.  Per EMS, she is noted to have a fever of 102 with some increased confusion.  She has had some loose stools.  She was noted to have some hypoxia with oxygen saturation of 80% on room air.  She was placed on nasal cannula oxygen.  Other history is limited due to her dementia and no family at bedside currently.  0900: Husband is at bedside.  On discussion with husband and chart review, patient was recently treated for endocarditis and possible discitis.  She had compression fractures of T12/L1 which showed some changes concerning for possible discitis.  She was treated with 6 weeks of antibiotics including Rocephin and ampicillin.  Per her husband, she is been having some increased fatigue since yesterday.  She pretty much slept all day yesterday.  She has some back pain but its been fairly constant since the compression fracture.  The husband said that she had reported she was going to the bathroom frequently but it is unclear whether she is having actual diarrhea.  She did have a bowel movement here in the ED that was mushy but not watery diarrhea.  She has not had any known cough.  She had an episode of vomiting this morning.        Past Medical History:  Diagnosis Date  . Allergy   . Anemia, pernicious   . Arthritis    bursitis   . Atrophic gastritis   . B12 deficiency   . Bacteremia due to Enterococcus 07/09/2020  . CAD (coronary artery disease)   . Carotid artery stenosis   . Cataract   . Diarrhea    on and off for months  . Enterococcus faecalis infection  09/06/2020  . Family history of adverse reaction to anesthesia    extreme nausea brother and sister  . Fibromyalgia   . Gallbladder polyp   . GERD (gastroesophageal reflux disease)   . Heart murmur   . History of hiatal hernia   . History of kidney stones   . HLD (hyperlipidemia)   . HTN (hypertension)   . Hypothyroidism   . Irritable bowel syndrome (IBS)   . Median arcuate ligament syndrome ([place])   . Osteoporosis   . PVD (peripheral vascular disease) ([place])   . Tubular adenoma of colon 08/2013   with focal high grade dysplasia    Patient Active Problem List   Diagnosis Date Noted  . Enterococcus faecalis infection 09/06/2020  . Acute CHF (congestive heart failure) ([place]) 07/22/2020  . Acute systolic CHF (congestive heart failure) ([place]) 07/22/2020  . Closed fracture of first lumbar vertebra ([place])   . Discitis   . 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 Bacteremia due to Enterococcus 07/09/2020  . Pressure injury of skin 07/05/2020  . Protein-calorie malnutrition, severe 07/05/2020  . Acute lower UTI 07/05/2020  . Compression fracture of L1 vertebra ([place]) 07/05/2020  . Fall at home, initial encounter 07/04/2020  . Fall 07/04/2020  . AKI (acute kidney injury) ([place])   . Transaminitis   . Allergic rhinitis due to pollen 06/08/2020  .  Occult blood in stools 06/08/2020  . Gastro-esophageal reflux disease without esophagitis 06/08/2020  . Vasomotor rhinitis 06/08/2020  . Wheezing 06/08/2020  . Diarrhea 10/27/2019  . Gastric polyp   . Intervertebral disc rupture 12/27/2018  . Lymphocytic colitis 09/15/2018  . B12 deficiency 06/29/2018  . Persistent atrial fibrillation ([place]) 04/05/2018  . Loose stools 07/06/2017  . Rectal bleeding 07/06/2017  . Generalized abdominal pain 07/06/2017  . Cervical spondylosis 03/11/2017  . Loss of weight 11/18/2013  . Nausea alone 11/18/2013  . Abdominal pain, epigastric 11/18/2013  . Anemia, iron deficiency 01/27/2013  . Temporomandibular  joint-pain-dysfunction syndrome (TMJ) 06/28/2012  . Other abnormal glucose 08/25/2011  . Allergic rhinitis, cause unspecified 08/25/2011  . Right-sided chest wall pain 08/25/2011  . Murmur 09/20/2010  . Incisional hernia 09/20/2010  . HERNIATED DISC 01/02/2010  . CAROTID ARTERY DISEASE 08/27/2009  . Osteoporosis 06/07/2009  . BRADYCARDIA 02/05/2009  . BENIGN POSITIONAL VERTIGO 09/27/2008  . CELIAC ARTERY COMPRESSION SYNDROME 10/25/2007  . Hypothyroidism 01/08/2007  . Hyperlipidemia 01/08/2007  . Essential hypertension 01/08/2007  . Coronary atherosclerosis 01/08/2007  . PERIPHERAL VASCULAR DISEASE 01/08/2007  . ATROPHIC GASTRITIS 01/08/2007  . ANEMIA, PERNICIOUS, HX OF 01/08/2007  . RENAL CALCULUS, HX OF 01/08/2007  . HYSTERECTOMY, HX OF 01/08/2007  . APPENDECTOMY, HX OF 01/08/2007    Past Surgical History:  Procedure Laterality Date  . ABDOMINAL HYSTERECTOMY    . APPENDECTOMY    . BIOPSY THYROID    . CARDIOVERSION N/A 04/03/2020   Procedure: CARDIOVERSION;  Surgeon: Dorothy Spark, MD;  Location: [facility] ENDOSCOPY;  Service: Cardiovascular;  Laterality: N/A;  . cataract surgery    . CHOLECYSTECTOMY N/A 10/24/2014   Procedure: MICROSCOPIC  CHOLECYSTECTOMY;  Surgeon: Ralene Ok, MD;  Location: [facility];  Service: General;  Laterality: N/A;  . COLONOSCOPY    . CORONARY ARTERY BYPASS GRAFT  07  . ESOPHAGEAL MANOMETRY N/A 08/02/2012   Procedure: ESOPHAGEAL MANOMETRY (EM);  Surgeon: Sable Feil, MD;  Location: [facility] ENDOSCOPY;  Service: Endoscopy;  Laterality: N/A;  . ESOPHAGOGASTRODUODENOSCOPY (EGD) WITH PROPOFOL N/A 04/07/2019   Procedure: ESOPHAGOGASTRODUODENOSCOPY (EGD) WITH PROPOFOL;  Surgeon: Milus Banister, MD;  Location: [facility] ENDOSCOPY;  Service: Endoscopy;  Laterality: N/A;  . HEMOSTASIS CLIP PLACEMENT  04/07/2019   Procedure: HEMOSTASIS CLIP PLACEMENT;  Surgeon: Milus Banister, MD;  Location: [facility] ENDOSCOPY;  Service: Endoscopy;;  . HOT HEMOSTASIS N/A 04/07/2019    Procedure: HOT HEMOSTASIS (ARGON PLASMA COAGULATION/BICAP);  Surgeon: Milus Banister, MD;  Location: [facility] END[facility];  Service: Endoscopy;  Laterality: N/A;  . POLYPECTOMY  04/07/2019   Procedure: POLYPECTOMY;  Surgeon: Milus Banister, MD;  Location: [facility] ENDOSCOPY;  Service: Endoscopy;;  . [name]  04/07/2019   Procedure: [name];  Surgeon: Milus Banister, MD;  Location: [facility] ENDOSCOPY;  Service: Endoscopy;;  . TEE WITHOUT CARDIOVERSION N/A 04/03/2020   Procedure: TRANSESOPHAGEAL ECHOCARDIOGRAM (TEE);  Surgeon: Dorothy Spark, MD;  Location[facility] ENDOSCOPY;  Service: Cardiovascular;  Laterality: N/A;  . THYROID LOBECTOMY    . UPPER GASTROINTESTINAL ENDOSCOPY       OB History   No obstetric history on file.     Family History  Problem Relation Age of Onset  . Heart disease Mother   . Cholelithiasis Mother   . Prostate cancer Brother   . Colon cancer Brother   . Breast cancer Other        niece - breast cancer  . Diabetes Sister   . Lung cancer Father   . Lung cancer  Sister   . Stomach cancer Cousin   . Esophageal cancer Neg Hx   . Rectal cancer Neg Hx     Social History   Tobacco Use  . Smoking status: Never Smoker  . Smokeless tobacco: Never Used  Vaping Use  . Vaping Use: Never used  Substance Use Topics  . Alcohol use: No    Alcohol/week: 0.0 standard drinks  . Drug use: No    Home Medications Prior to Admission medications   Medication Sig Start Date End Date Taking? Authorizing Provider  acetaminophen (TYLENOL) 500 MG tablet Take 2 tablets (1,000 mg total) by mouth every 8 (eight) hours as needed for mild pain. 07/13/20   Ghimire, Henreitta Leber, MD  atorvastatin (LIPITOR) 80 MG tablet Take 0.5 tablets (40 mg total) by mouth at bedtime. 05/29/20   Burtis Junes, NP  Calcium Carbonate-Vitamin D (CALTRATE 600+D PO) Take 1 tablet by mouth daily with lunch.     [provider]  ELIQUIS 2.5 MG TABS tablet TAKE 1 TABLET BY MOUTH TWICE DAILY Patient  taking differently: Take 2.5 mg by mouth 2 (two) times daily. 06/28/20   Burchette, Alinda Sierras, MD  furosemide (LASIX) 20 MG tablet Take 3 tablets (60 mg total) by mouth daily. 07/28/20   Mikhail, Velta Addison, DO  gabapentin (NEURONTIN) 100 MG capsule Take 1 capsule (100 mg total) by mouth 2 (two) times daily. 01/24/20   Burchette, Alinda Sierras, MD  Infant Care Products [facility]) OINT Apply 1 application topically in the morning, at noon, and at bedtime.    [provider]  ipratropium (ATROVENT) 0.03 % nasal spray Place 2 sprays into both nostrils every 12 (twelve) hours.    [provider]  lactase (LACTAID) 3000 units tablet Take 3,000 Units by mouth every 6 (six) hours as needed (lactose Intolerance).    [provider]  lactose free nutrition (BOOST PLUS) LIQD Take 237 mLs by mouth 3 (three) times daily with meals. 07/13/20   Ghimire, Henreitta Leber, MD  levothyroxine (SYNTHROID) 75 MCG tablet Take 1 tablet (75 mcg total) by mouth daily at 6 (six) AM. 07/28/20   Mikhail, Velta Addison, DO  Lidocaine 4 % PTCH Place 1 patch onto the skin daily as needed (pain.).     [provider]  Lidocaine-Menthol (LIDOCAINE-MENTHOL ROLL-ON) 4-1 % LIQD Apply 1 application topically every 8 (eight) hours as needed (pain).    [provider]  Multiple Vitamins-Minerals (PRESERVISION AREDS 2 PO) Take 1 tablet by mouth 2 (two) times daily.    [provider]  nitroGLYCERIN (NITROSTAT) 0.4 MG SL tablet Place 0.4 mg under the tongue every 5 (five) minutes as needed for chest pain.    [provider]  oxyCODONE (OXY IR/ROXICODONE) 5 MG immediate release tablet Take 1 tablet (5 mg total) by mouth every 6 (six) hours as needed for severe pain. 09/10/20   Burchette, Alinda Sierras, MD  Polyethyl Glycol-Propyl Glycol (SYSTANE FREE OP) Place 1 drop into both eyes every 8 (eight) hours as needed (dry eyes).    [provider]  polyethylene glycol (MIRALAX / GLYCOLAX) 17 g packet Take 17 g  by mouth daily. 07/14/20   Ghimire, Henreitta Leber, MD  sennosides-docusate sodium (SENOKOT-S) 8.6-50 MG tablet Take 1 tablet by mouth daily.    [provider]  sodium chloride (OCEAN) 0.65 % SOLN nasal spray Place 1 spray into both nostrils daily as needed for congestion.    [provider]  sucralfate (CARAFATE) 1 GM/10ML suspension TAKE  10 MLS (1 G TOTAL) BY MOUTH 2 (TWO) TIMES DAILY. Patient taking differently: Take 1 g by mouth 2 (two) times daily. 08/23/19   Burchette, Alinda Sierras, MD  traZODone (DESYREL) 50 MG tablet Take 25 mg by mouth at bedtime.    [provider]  triamcinolone cream (KENALOG) 0.1 % Apply 1 application topically 2 (two) times daily. 09/25/20   Burchette, Alinda Sierras, MD    Allergies    Actos [pioglitazone], Sulfonamide derivatives, Adhesive [tape], and Codeine  Review of Systems   Review of Systems  Unable to perform ROS: Dementia    Physical Exam Updated Vital Signs BP 136/76 (BP Location: Right Arm)   Pulse (!) 114   Temp 98.5 F (36.9 C) (Oral)   SpO2 98%   Physical Exam Constitutional:      Appearance: She is well-developed. She is ill-appearing.     Comments: She is awake with eyes closed but she will open her eyes to verbal stimuli  HENT:     Head: Normocephalic and atraumatic.  Eyes:     Pupils: Pupils are equal, round, and reactive to light.  Cardiovascular:     Rate and Rhythm: Regular rhythm. Tachycardia present.     Heart sounds: Murmur heard.    Pulmonary:     Effort: Pulmonary effort is normal. Tachypnea present. No respiratory distress.     Breath sounds: Normal breath sounds. No wheezing or rales.  Chest:     Chest wall: No tenderness.  Abdominal:     General: Bowel sounds are normal.     Palpations: Abdomen is soft.     Tenderness: There is abdominal tenderness (Mild diffuse tenderness, more in the lower abdomen). There is no guarding or rebound.  Musculoskeletal:        General: Normal range of motion.      Cervical back: Normal range of motion and neck supple.     Comments: There are some scabbed areas to the dorsum of her feet, no warmth, erythema, drainage or other signs of active infection  Lymphadenopathy:     Cervical: No cervical adenopathy.  Skin:    General: Skin is warm and dry.     Findings: No rash.  Neurological:     Mental Status: She is alert.     Comments: She will open her eyes to verbal stimuli.  She is moving all her extremities.  Has incomprehensible verbal response.     ED Results / Procedures / Treatments   Labs (all labs ordered are listed, but only abnormal results are displayed) Labs Reviewed  CULTURE, BLOOD (ROUTINE X 2)  CULTURE, BLOOD (ROUTINE X 2)  RESP PANEL BY RT-PCR (FLU A&B, COVID) ARPGX2  COMPREHENSIVE METABOLIC PANEL  LACTIC ACID, PLASMA  LACTIC ACID, PLASMA  CBC WITH DIFFERENTIAL/PLATELET  PROTIME-INR  URINALYSIS, ROUTINE W REFLEX MICROSCOPIC    EKG None  Radiology No results found.  Procedures Procedures   Medications Ordered in ED Medications - No data to display  ED Course  I have reviewed the triage vital signs and the nursing notes.  Pertinent labs & imaging results that were available during my care of the patient were reviewed by me and considered in my medical decision making (see chart for details).    MDM Rules/Calculators/A&P                          Patient is a 87 year old female who presents with altered mental status and concern for  sepsis.  She has a rectal temp of 104.  She was started on septic protocol with IV fluids and antibiotics.  She has some abdominal pain on exam.  Her CT scan does not show any acute process.  CT of her head is unremarkable without any acute process.  Her labs show an elevated WBC count and a lactate of 4.  She was given full 30 cc/kg fluid bolus.  She is mildly hypoxic but maintaining normal saturations on nasal cannula.  Chest x-ray does show some increased pulmonary edema.  However given  her sepsis, she was still given IV fluid boluses.  She is maintaining normal oxygen saturations at this time without increased work of breathing.  Blood cultures were obtained.  She did have a urinalysis done 2 days ago which appears to be concerning for infection.  0900: Patient is more alert and answering questions.  I spoke with Dr. Tamala Julian who will admit the pt.  CRITICAL CARE Performed by: Malvin Johns Total critical care time: 75 minutes Critical care time was exclusive of separately billable procedures and treating other patients. Critical care was necessary to treat or prevent imminent or life-threatening deterioration. Critical care was time spent personally by me on the following activities: development of treatment plan with patient and/or surrogate as well as nursing, discussions with consultants, evaluation of patient's response to treatment, examination of patient, obtaining history from patient or surrogate, ordering and performing treatments and interventions, ordering and review of laboratory studies, ordering and review of radiographic studies, pulse oximetry and re-evaluation of patient's condition.  Final Clinical Impression(s) / ED Diagnoses Final diagnoses:  None    Rx / DC Orders ED Discharge Orders    None       Malvin Johns, MD 10/12/2020 [phone]

## 2020-09-27 NOTE — ED Notes (Addendum)
[id number] & [id number] orders mistakenly acknowledged, wrong patient.

## 2020-09-27 NOTE — ED Notes (Signed)
Pt remains unresponsive. Pt's family at bedside.

## 2020-09-27 NOTE — Consult Note (Signed)
                                                                                   Consultation Note Date: 10/15/2020   Patient Name: [name]  DOB: [birth year]  MRN: [id number]  Age / Sex: 87 y.o., female  PCP: Sally Post, MD Referring Physician: Norval Morton, MD  Reason for Consultation: Establishing goals of care and Terminal Care  HPI/Patient Profile: 87 y.o. female  with past medical history of dementia, persistent a fib., CAD s/p CABG, CHF, atrophic gastritis, severe protein malnutrition, HTN, HLD, hypothyroid, CKD III, chronic anemia, discitis, large mitral valve vegetation found on March admission- was not an operative candidate admitted on 10/14/2020 with sepsis. Palliative medicine consulted for end of life care.   Clinical Assessment and Goals of Care: Chart reviewed. Patient has MOST form on chart indicating desire for comfort measures and trial of antibiotics and IV fluids.  However, while in the ED she declined dramatically with significant hypotension and bradycardia. Attending team discussed transition to full comfort measures only.  On my eval, patient opens eyes briefly but is not verbal. She is jaundiced. Her sister and spouse are at bedside and spouse is calling family members informing them of patient's situation.  Per [name]'s sister they have agreed to comfort measures only and are expecting patient to die quickly. Their goal is that she is comfortable.    Primary Decision Maker NEXT OF KIN- spouse who is supported by patient's sister    SUMMARY OF RECOMMENDATIONS -Agree with comfort measures only -Patient is pending transfer to 6N floor -Comfort orders as entered -PMT will follow and assist with symptom management -Chaplain consult     Code Status/Advance Care Planning:  DNR   Prognosis:    Hours - Days  Discharge Planning: Anticipated Hospital Death  Scheduled Meds: Continuous Infusions: . morphine     PRN Meds:.acetaminophen **OR**  acetaminophen, albuterol, antiseptic oral rinse, glycopyrrolate **OR** glycopyrrolate **OR** glycopyrrolate, haloperidol **OR** haloperidol **OR** haloperidol lactate, lidocaine, LORazepam **OR** LORazepam **OR** LORazepam, morphine, ondansetron **OR** ondansetron (ZOFRAN) IV, polyvinyl alcohol  Review of Systems  Unable to perform ROS: Acuity of condition    Physical Exam Vitals and nursing note reviewed.  Constitutional:      Comments: Frail, cachetic  Cardiovascular:     Rate and Rhythm: Bradycardia present.  Skin:    Coloration: Skin is jaundiced.     Vital Signs: BP (!) 101/48   Pulse 66   Temp 98.5 F (36.9 C) (Oral)   Resp (!) 30   SpO2 95%      Thank you for this consult. Palliative medicine will continue to follow and assist as needed.   Time In: 1422 Time Out: 1528 Time Total: 68 mins Greater than 50%  of this time was spent counseling and coordinating care related to the above assessment and plan.  Signed by: [name], AGNP-C Palliative Medicine    Please contact Palliative Medicine Team phone at [phone] for questions and concerns.  For individual provider: See [name]

## 2020-09-27 NOTE — Consult Note (Addendum)
Cardiology Consultation:   Patient ID: [name] MRN: [id number]; DOB: December 18, 1932  Admit date: 10/10/2020 Date of Consult: 10/25/2020  PCP:  [name], MD   [facility] Providers Cardiologist:  Sherren Mocha, MD      Patient Profile:   [name] is a 87 y.o. female with a complex PMH of persistent atrial fibrillation,  sinus bradycardia,  CAD s/p CABG in 2007, severe MR, chronic systolic and diastolic heart failure, atrophic gastritis, severe protein malnutrition, chronic anemia,  hypertension, hyperlipidemia, hypothyroidism, CKD III, who is admitted for sepsis, acute hypoxic respiratory failure. Cardiology is consulted on 10/03/2020 for concern of CHF at the request of Dr. Tamala Julian.    History of Present Illness:   [name] follows Dr Burt Knack outpatient.   She had CABG in 2007 for CAD.   She had persistent atrial fibrillation with symptoms and underwent TEE with DCCV with successful conversion to NSR on 04/03/2020. She was started on amiodarone on 04/17/20 office visit for antiarrhythmic drug therapy, due to recurrence of A fib and difficultly remain in NSR with underlying severe MR. She was planned for repeating DCCV, but found to be in sinus bradycardia with HR 40s on 05/01/20, procedure was cancelled. Later at follow up office appointment 05/23/20, her metoprolol was discontinued and amiodarone were reduced to 200mg  daily due to persistent bradycardia. She is taking Eliquis 2.5mg  BID for anticoagulation.   For HFmrEF, she has been managed on losartan, spironolactone for GDMT.   For her severe MR, she was evaluated Dr Burt Knack on 06/20/20 for mitral valve clip procedure and decision was made to continue surveillance given her complex comorbidity. For her moderate to severe AI, she was evaluated by cardiac surgery in the past and she was felt not a good surgical candidate for multi valvular replacement due to frailty.   She had a recent hospitalization 07/04/20 to 07/13/20  for Enterococcus bacteremia with aortic valve endocarditis 2/2 possible T12/L1 discitis. TTE obtained on 07/06/2020 showed EF 40 to 45%, RVSP 46.6 mmHg, moderate LAE, moderate to severe MR, 1.5 cm x 0.5 cm aortic valve vegetation with moderate to severe AI. She was seen by CT surgery 07/07/20, due to her frailty/poor mobility/malnutrition and previous CABG, she was not felt a surgical candidate for for her vegetation. Cardiology was consulted during that hospitalization for aortic valve endocarditis. Given the fact she is not a surgical candidate, TEE was not done, and she was recommended continue IV antibiotic for endocarditis. She was treated with 6 weeks of ceftriaxone and ampicillin.  Her anticoagulation was interrupted due to concern of septic emboli. MRI of brain was negative for embolic CVA, she was started back on Eliquis. She was made DNR/DNI after palliative care consultation. She was stopped on amiodarone due to elevated LFTs, metoprolol 12.5mg  BID was re-attempted for A fib rate control, but eventually discontinued by discharge due to bradycardia.   She was hospitalized again from 07/22/20 to 07/28/20 for decompensated CHF. She was diuresed with IV Lasix with 4 L of fluid removal and discharged on PO lasix 60mg  daily.  She had mild elevation of troponin 27 -65, which was felt due to demand ischemia.  She was thought to be in myxedema with TSH of 65, her levothyroxine was uptitrated.  She had episodes of marked bradycardia with heart rate at 20s, due to sinus pause and junctional rhythm.  She was asymptomatic during the event.  Dr. Caryl Comes was consulted and pacemaker was not felt indicated in the setting of  aortic valve agitation.   Patient returned to the ER today with fever of 102, diarrhea, lethargy, increased confusion and hypoxia with pulse ox at 80% on room air.  Sister and husband are at bedside, reporting that patient has been doing poorly over the past year requiring recurrent hospitalization.  She  is essentially bed bound.  She has not been eating well over the past few days.  She is incontinent of urine.  She was having fever and becoming increasingly confused and lethargic at home.  They are concerned if she has infection again.  Husband states she thought patient was gone during encounter. Sister states patient was responsive earlier but is not responding to family members anymore now.   Diagnostic work-up revealed creatinine 1.49, GFR 37, albumin 3.1, bicarb 19.  lactic acid elevated at 4.1 >2.8.  CBC differential revealed leukocytosis with WBC 12 400.  INR 1.4.  Flu and COVID-negative.  Urinalysis suggest gross pyuria.  Blood culture x2 sent.  CTAP reveals small volume of ascites, no acute findings of abdomen and pelvis.  Moderate bilateral pleural effusion with bibasilar area of atelectasis.  See report for detail.  CT head revealed no acute finding.  Chest x-ray revealed cardiomegaly, progressive diffuse bilateral pulmonary infiltrate/edema, bilateral pleural effusion, left greater than right suggestive of CHF, bilateral pneumonia cannot be ruled out.  BNP elevated 1828.  Troponin 588 >724.  She was admitted to hospital medicine service.  She was given IV fluid bolus Tylenol, and started on broad-spectrum antibiotic    Past Medical History:  Diagnosis Date  . Allergy   . Anemia, pernicious   . Arthritis    bursitis   . Atrophic gastritis   . B12 deficiency   . Bacteremia due to Enterococcus 07/09/2020  . CAD (coronary artery disease)   . Carotid artery stenosis   . Cataract   . Diarrhea    on and off for months  . Enterococcus faecalis infection 09/06/2020  . Family history of adverse reaction to anesthesia    extreme nausea brother and sister  . Fibromyalgia   . Gallbladder polyp   . GERD (gastroesophageal reflux disease)   . Heart murmur   . History of hiatal hernia   . History of kidney stones   . HLD (hyperlipidemia)   . HTN (hypertension)   . Hypothyroidism   .  Irritable bowel syndrome (IBS)   . Median arcuate ligament syndrome ([place])   . Osteoporosis   . PVD (peripheral vascular disease) ([place])   . Tubular adenoma of colon 08/2013   with focal high grade dysplasia    Past Surgical History:  Procedure Laterality Date  . ABDOMINAL HYSTERECTOMY    . APPENDECTOMY    . BIOPSY THYROID    . CARDIOVERSION N/A 04/03/2020   Procedure: CARDIOVERSION;  Surgeon: Dorothy Spark, MD;  Location: [facility] ENDOSCOPY;  Service: Cardiovascular;  Laterality: N/A;  . cataract surgery    . CHOLECYSTECTOMY N/A 10/24/2014   Procedure: MICROSCOPIC  CHOLECYSTECTOMY;  Surgeon: Ralene Ok, MD;  Location: [place] [place];  Service: General;  Laterality: N/A;  . COLONOSCOPY    . CORONARY ARTERY BYPASS GRAFT  07  . ESOPHAGEAL MANOMETRY N/A 08/02/2012   Procedure: ESOPHAGEAL MANOMETRY (EM);  Surgeon: Sable Feil, MD;  Location: [facility] ENDOSCOPY;  Service: Endoscopy;  Laterality: N/A;  . ESOPHAGOGASTRODUODENOSCOPY (EGD) WITH PROPOFOL N/A 04/07/2019   Procedure: ESOPHAGOGASTRODUODENOSCOPY (EGD) WITH PROPOFOL;  Surgeon: Milus Banister, MD;  Location: [facility] ENDOSCOPY;  Service: Endoscopy;  Laterality: N/A;  .  HEMOSTASIS CLIP PLACEMENT  04/07/2019   Procedure: HEMOSTASIS CLIP PLACEMENT;  Surgeon: Milus Banister, MD;  Location: [facility] ENDOSCOPY;  Service: Endoscopy;;  . HOT HEMOSTASIS N/A 04/07/2019   Procedure: HOT HEMOSTASIS (ARGON PLASMA COAGULATION/BICAP);  Surgeon: Milus Banister, MD;  Location: [facility] ENDOSCOPY;  Service: Endoscopy;  Laterality: N/A;  . POLYPECTOMY  04/07/2019   Procedure: POLYPECTOMY;  Surgeon: Milus Banister, MD;  Location: [facility] ENDOSCOPY;  Service: Endoscopy;;  . [name]  04/07/2019   Procedure: [name];  Surgeon: Milus Banister, MD;  Location: [facility] ENDOSCOPY;  Service: Endoscopy;;  . TEE WITHOUT CARDIOVERSION N/A 04/03/2020   Procedure: TRANSESOPHAGEAL ECHOCARDIOGRAM (TEE);  Surgeon: Dorothy Spark, MD;  Location: [facility] ENDOSCOPY;  Service: Cardiovascular;   Laterality: N/A;  . THYROID LOBECTOMY    . UPPER GASTROINTESTINAL ENDOSCOPY       Home Medications:  Prior to Admission medications   Medication Sig Start Date End Date Taking? Authorizing Provider  acetaminophen (TYLENOL) 500 MG tablet Take 2 tablets (1,000 mg total) by mouth every 8 (eight) hours as needed for mild pain. 07/13/20  Yes Ghimire, Henreitta Leber, MD  atorvastatin (LIPITOR) 80 MG tablet Take 0.5 tablets (40 mg total) by mouth at bedtime. 05/29/20  Yes Burtis Junes, NP  Calcium Carbonate-Vitamin D (CALTRATE 600+D PO) Take 1 tablet by mouth daily with lunch.    Yes [provider]  ELIQUIS 2.5 MG TABS tablet TAKE 1 TABLET BY MOUTH TWICE DAILY Patient taking differently: Take 2.5 mg by mouth 2 (two) times daily. 06/28/20  Yes Burchette, Alinda Sierras, MD  gabapentin (NEURONTIN) 100 MG capsule Take 1 capsule (100 mg total) by mouth 2 (two) times daily. 01/24/20  Yes Burchette, Alinda Sierras, MD  [facility]) OINT Apply 1 application topically 2 (two) times daily as needed (skin care).   Yes [provider]  ipratropium (ATROVENT) 0.03 % nasal spray Place 2 sprays into both nostrils every 12 (twelve) hours.   Yes [provider]  lactase (LACTAID) 3000 units tablet Take 3,000 Units by mouth every 6 (six) hours as needed (lactose Intolerance).   Yes [provider]  lactose free nutrition (BOOST PLUS) LIQD Take 237 mLs by mouth 3 (three) times daily with meals. 07/13/20  Yes Ghimire, Henreitta Leber, MD  levothyroxine (SYNTHROID) 75 MCG tablet Take 1 tablet (75 mcg total) by mouth daily at 6 (six) AM. Patient taking differently: Take 50 mcg by mouth daily before breakfast. 07/28/20  Yes Mikhail, Velta Addison, DO  Lidocaine 4 % PTCH Place 1 patch onto the skin daily as needed (pain.).    Yes [provider]  Lidocaine-Menthol (LIDOCAINE-MENTHOL ROLL-ON) 4-1 % LIQD Apply 1 application topically every 8 (eight) hours as needed (pain).   Yes [provider]  metoprolol succinate (TOPROL-XL) 25 MG 24 hr tablet Take 25 mg by mouth daily.   Yes [provider]  Multiple Vitamins-Minerals (PRESERVISION AREDS 2 PO) Take 1 tablet by mouth 2 (two) times daily.   Yes [provider]  nitroGLYCERIN (NITROSTAT) 0.4 MG SL tablet Place 0.4 mg under the tongue every 5 (five) minutes as needed for chest pain.   Yes [provider]  oxyCODONE (OXY IR/ROXICODONE) 5 MG immediate release tablet Take 1 tablet (5 mg total) by mouth every 6 (six) hours as needed for severe pain. 09/10/20  Yes Burchette, Alinda Sierras, MD  Polyethyl Glycol-Propyl Glycol (SYSTANE FREE OP) Place 1 drop into both eyes every 8 (eight) hours as needed (dry eyes).  Yes [provider]  polyethylene glycol (MIRALAX / GLYCOLAX) 17 g packet Take 17 g by mouth daily. 07/14/20  Yes Ghimire, Henreitta Leber, MD  sennosides-docusate sodium (SENOKOT-S) 8.6-50 MG tablet Take 1 tablet by mouth daily.   Yes [provider]  sodium chloride (OCEAN) 0.65 % SOLN nasal spray Place 1 spray into both nostrils daily as needed for congestion.   Yes [provider]  spironolactone (ALDACTONE) 25 MG tablet Take 25 mg by mouth daily. 09/12/20  Yes [provider]  traZODone (DESYREL) 50 MG tablet Take 25 mg by mouth at bedtime as needed for sleep.   Yes [provider]  triamcinolone cream (KENALOG) 0.1 % Apply 1 application topically 2 (two) times daily. 09/25/20  Yes Burchette, Alinda Sierras, MD    Inpatient Medications: Scheduled Meds: . apixaban  2.5 mg Oral BID  . atorvastatin  40 mg Oral QHS  . gabapentin  100 mg Oral BID  . lactose free nutrition  237 mL Oral TID WC  . [START ON 10/19/20] levothyroxine  50 mcg Oral QAC breakfast  . metoprolol succinate  25 mg Oral Daily  . sodium chloride flush  3 mL Intravenous Q12H   Continuous Infusions: . ceFEPime (MAXIPIME) IV Stopped (10/06/2020 0953)  . metronidazole Stopped (10/04/2020 1022)  .  [START ON 10-19-2020] vancomycin     PRN Meds: acetaminophen **OR** acetaminophen, albuterol, Dermacloud, lidocaine, Lidocaine-Menthol, ondansetron (ZOFRAN) IV, oxyCODONE, sodium chloride, traZODone  Allergies:    Allergies  Allergen Reactions  . Actos [Pioglitazone] Swelling    Ankle swelling  . Sulfonamide Derivatives Swelling  . Adhesive [Tape] Other (See Comments)    WITH PROLONGED EXPOSURE (REDNESS/ITCHING)  . Codeine Nausea And Vomiting    Social History:   Social History   Socioeconomic History  . Marital status: Married    Spouse name: [name]  . Number of children: 0  . Years of education: 7  . Highest education level: Not on file  Occupational History  . Occupation: retired    Fish farm manager: RETIRED  Tobacco Use  . Smoking status: Never Smoker  . Smokeless tobacco: Never Used  Vaping Use  . Vaping Use: Never used  Substance and Sexual Activity  . Alcohol use: No    Alcohol/week: 0.0 standard drinks  . Drug use: No  . Sexual activity: Not on file  Other Topics Concern  . Not on file  Social History Narrative   [name], Married. Lives with husband. Had been a very active woman.   Social Determinants of Health   Financial Resource Strain: Low Risk   . Difficulty of Paying Living Expenses: Not hard at all  Food Insecurity: Not on file  Transportation Needs: No Transportation Needs  . Lack of Transportation (Medical): No  . Lack of Transportation (Non-Medical): No  Physical Activity: Not on file  Stress: Not on file  Social Connections: Not on file  Intimate Partner Violence: Not on file    Family History:    Family History  Problem Relation Age of Onset  . Heart disease Mother   . Cholelithiasis Mother   . Prostate cancer Brother   . Colon cancer Brother   . Breast cancer Other        niece - breast cancer  . Diabetes Sister   . Lung cancer Father   . Lung cancer Sister   . Stomach cancer Cousin   . Esophageal cancer Neg Hx   . Rectal cancer Neg Hx  ROS:  Unable to perform ROS as patient is somnolent Physical Exam/Data:   Vitals:   10/03/2020 1045 10/23/2020 1230 09/29/2020 1245 10/19/2020 1315  BP: 122/64 (!) 110/54 (!) 109/45 (!) 93/43  Pulse:  74 (!) 33 (!) 54  Resp: (!) 28 (!) 23 20 (!) 24  Temp:      TempSrc:      SpO2:  100% 94% 96%    Intake/Output Summary (Last 24 hours) at 10/20/2020 1321 Last data filed at 09/29/2020 1022 Gross per 24 hour  Intake 1700 ml  Output --  Net 1700 ml   Last 3 Weights 09/25/2020 09/10/2020 09/06/2020  Weight (lbs) 98 lb 89 lb 4.8 oz 89 lb  Weight (kg) 44.453 kg 40.506 kg 40.37 kg     There is no height or weight on file to calculate BMI.   Vitals:  Vitals:   10/16/2020 1400 10/25/2020 1430  BP: (!) 97/46 (!) 101/48  Pulse: 66   Resp: 19 (!) 30  Temp:    SpO2: 95%    General Appearance: Somnolent, pallor, laying in bed  HEENT: Normocephalic, atraumatic.  Neck: Supple, trachea midline, + JVDs Cardiovascular: Regular rate and rhythm, normal U1-L2, grade 3 systolic murmurat apex Respiratory: Resting breathing mildly labored, lungs sounds with rales at base bilaterally, no use of accessory muscles. On 6LNC with pox 100%. .   Gastrointestinal: Bowel sounds positive, abdomen soft, non-tender, non-distended.   Extremities: Minimal movement of all extremities in bed,  trace edema BLE, bilateral feet and ankle mottling  Genitourinary: Urinary incontinent Musculoskeletal: Generalized muscular atrophy and deconditioning  Skin: Intact, warm, dry.  Extremity mottling noted  Neurologic: Somnolent, eyes open to sternal rub and loud voice, nonverbal, does not follow commands, minimal spontaneous movement of all extremities in bed, globally weak, complete neuro exam is difficult Psychiatric: Calm.    EKG:  The EKG was personally reviewed and demonstrates: Sinus tachycardia with ventricular rate of 118, LBBB  Telemetry:  Telemetry was personally reviewed and demonstrates: Sinus rhythm with rate of 60s,  sinus bradycardia 30s noted    Relevant CV Studies:  Echocardiogram from 07/15/2020:  IMPRESSIONS    1. Left ventricular ejection fraction, by estimation, is 50%. The left  ventricle has mildly decreased function. The left ventricle demonstrates  regional wall motion abnormalities, septal-lateral dyssychrony consistent  with LBBB and mild septal  hypokinesis. Left ventricular diastolic parameters are consistent with  Grade II diastolic dysfunction (pseudonormalization).  2. Right ventricular systolic function is mildly reduced. The right  ventricular size is normal. There is mildly elevated pulmonary artery  systolic pressure.  3. There is a small (0.5 x 0.9 cm) mobile vegetation noted on the  ventricular size of the aortic valve. The aortic valve is tricuspid.  Aortic valve regurgitation is moderate to severe and eccentric, possible  holodiastolic flow reversal in descending  thoracic aorta (signal not great). No aortic stenosis is present.  4. The mitral valve is abnormal. Severe mitral valve regurgitation,  eccentric anterior jet likely due to P2 prolapse vs partial flail. No  evidence of mitral stenosis.  5. Left atrial size was mildly dilated.  6. The inferior vena cava is normal in size with greater than 50%  respiratory variability, suggesting right atrial pressure of 3 mmHg.  7. Pleural effusion noted on left.  8. Impression: Aortic valve endocarditis, mod-severe aortic  insufficiency, severe MR.   Laboratory Data:  High Sensitivity Troponin:   Recent Labs  Lab 10/14/2020 1111  TROPONINIHS 588*  Chemistry Recent Labs  Lab 09/25/20 1511 10/02/2020 0701  NA 140 136  K 4.7 4.5  CL 106 103  CO2 25 19*  GLUCOSE 102* 162*  BUN 15 15  CREATININE 1.17 1.41*  CALCIUM 8.8 8.6*  GFRNONAA  --  37*  ANIONGAP  --  14    Recent Labs  Lab 10/24/2020 0701  PROT 6.5  ALBUMIN 3.1*  AST 42*  ALT 23  ALKPHOS 67  BILITOT 0.6   Hematology Recent Labs  Lab  10/17/2020 0701  WBC 12.4*  RBC 3.90  HGB 8.9*  HCT 30.6*  MCV 78.5*  MCH 22.8*  MCHC 29.1*  RDW 19.0*  PLT 201   BNP Recent Labs  Lab 09/30/2020 1111  BNP 1,828.2*    DDimer No results for input(s): DDIMER in the last 168 hours.   Radiology/Studies:  CT Abdomen Pelvis Wo Contrast  Result Date: 10/04/2020 CLINICAL DATA:  87 year old female with history of abdominal pain and fever. Diarrhea. EXAM: CT ABDOMEN AND PELVIS WITHOUT CONTRAST TECHNIQUE: Multidetector CT imaging of the abdomen and pelvis was performed following the standard protocol without IV contrast. COMPARISON:  CT the abdomen and pelvis 07/04/2020. FINDINGS: Lower chest: Moderate bilateral pleural effusions. Dependent areas of atelectasis are noted in the lung bases bilaterally. Mild cardiomegaly. Atherosclerotic calcifications in the descending thoracic aorta. Hepatobiliary: No definite suspicious cystic or solid hepatic lesions are confidently identified on today's noncontrast CT examination. Gallbladder is not visualized, likely surgically absent (no surgical clips are noted in the gallbladder fossa). Pancreas: No definite pancreatic mass or peripancreatic fluid collections or inflammatory changes. Spleen: Unremarkable. Adrenals/Urinary Tract: 2 mm nonobstructive calculus in the interpolar collecting system of the left kidney. Exophytic 2 cm low-attenuation lesion in the anterior aspect of the interpolar region of the left kidney (axial image 24 of series 3) measuring 2.1 cm in diameter, incompletely characterized on today's non-contrast CT examination, but previously characterized as a simple cyst. Mild parenchymal atrophy in both kidneys. Unenhanced appearance of the right kidney and bilateral adrenal glands is otherwise normal. No hydroureteronephrosis. Urinary bladder is normal in appearance. Stomach/Bowel: Unenhanced appearance of the stomach is normal. No pathologic dilatation of small bowel or colon. Numerous colonic  diverticulae are noted, particularly in the sigmoid colon, without surrounding inflammatory changes to suggest an acute diverticulitis at this time. The appendix is not confidently identified and may be surgically absent. Regardless, there are no inflammatory changes noted adjacent to the cecum to suggest the presence of an acute appendicitis at this time. Vascular/Lymphatic: Aortic atherosclerosis. No lymphadenopathy noted in the abdomen or pelvis. Reproductive: Status post hysterectomy.  Ovaries are trophic. Other: Small volume of ascites.  No pneumoperitoneum. Musculoskeletal: There are no aggressive appearing lytic or blastic lesions noted in the visualized portions of the skeleton. Chronic compression fracture of L1 with 60% loss of anterior vertebral body height, similar to the prior study. IMPRESSION: 1. Small volume of ascites. No other acute findings are noted in the abdomen or pelvis. 2. Moderate bilateral pleural effusions with bibasilar areas of atelectasis in the lung bases. 3. 2 mm nonobstructive calculus in the interpolar collecting system of left kidney. 4. Colonic diverticulosis without definite imaging findings to suggest an acute diverticulitis at this time. 5. Additional incidental findings, as above. Electronically Signed   By: Vinnie Langton M.D.   On: 10/20/2020 08:03   DG Chest 2 View  Result Date: 10/14/2020 CLINICAL DATA:  Suspected sepsis. EXAM: CHEST - 2 VIEW COMPARISON:  07/22/2020. FINDINGS: Prior CABG.  Cardiomegaly. Progressive diffuse bilateral pulmonary interstitial infiltrates/edema. Bilateral pleural effusions, left side greater than right. Findings suggest CHF. Bilateral pneumonia cannot be excluded. No pneumothorax. Degenerative change thoracic spine. IMPRESSION: Prior CABG. Cardiomegaly. Progressive diffuse bilateral pulmonary infiltrates/edema. Bilateral pleural effusions, left side greater than right. Findings suggest CHF. Bilateral pneumonia cannot be excluded.  Electronically Signed   By: Marcello Moores  Register   On: 10/12/2020 07:31   CT Head Wo Contrast  Result Date: 10/02/2020 CLINICAL DATA:  87 year old female with history of mental status change. EXAM: CT HEAD WITHOUT CONTRAST TECHNIQUE: Contiguous axial images were obtained from the base of the skull through the vertex without intravenous contrast. COMPARISON:  Head CT 07/04/2020. FINDINGS: Brain: Severe cerebral and cerebellar atrophy again noted. Patchy and confluent areas of decreased attenuation are noted throughout the deep and periventricular white matter of the cerebral hemispheres bilaterally, compatible with chronic microvascular ischemic disease. Physiologic calcifications are noted in the basal ganglia bilaterally. No evidence of acute infarction, hemorrhage, hydrocephalus, extra-axial collection or mass lesion/mass effect. Vascular: No hyperdense vessel or unexpected calcification. Skull: Normal. Negative for fracture or focal lesion. Sinuses/Orbits: Chronic opacification of the left frontal sinus, similar to the prior study. Other: None. IMPRESSION: 1. No acute intracranial abnormalities. 2. Severe cerebral and cerebellar atrophy with chronic microvascular ischemic changes in the cerebral white matter, as above. 3. Chronic left frontal paranasal sinus disease redemonstrated. Electronically Signed   By: Vinnie Langton M.D.   On: 10/22/2020 07:51     Assessment and Plan:   Septic shock Pyuria  Acute metabolic encephalopathy History of discitis and Enterococcus bacteremia with aortic valve endocarditis 06/2020 -Presented with fever, tachycardia, lethargy, hypoxia  -Work-up revealed leukocytosis, lactic acidosis, pyuria, CXR CHF versus pneumonia -Progressed to unresponsiveness, marked hypotension and bradycardia while at ED, despite IVF resuscitation and broad-spectrum antibiotic -Presentation is consistent with septic shock, suspect recurrence of aortic endocarditis as surgery was not an option   -Discussed goals of care with patient's sister and husband at beside, husband does not wish patient with further suffering, felt comfort care is what patient would wanted, will defer to hospitalist for care transition   Acute on chronic systolic and diastolic heart failure Severe MR Severe AI Elevated troponin CAD with history of CABG 2007  -Chest x-ray demonstrates finding possible suggest CHF -BNP 1828 -Hs Trop 588 >724, possible demand  -Clinical exam reveals hypovolemia versus low output HF with lethargy, mottling of feet, hypotension, etc  - Aggressive diuresis is prohibited at this time due to septic shock - Discussed options of ICU transfer for escalation of care and possibility of vasopressor and inotrope support, ever it would not essentially change the reality that patient is frail, malnutrition, with complex comorbidity, not a surgical candidate for underlying valvular disease and vegetation, and further deteriorating. Discussed options of DNR, DNI, comfort measures.  Patient's husband has a very good understanding of patient's clinical course, expressed wish that patient would not want to live like this, family are leaning towards making patient comfort care.  -Will defer to hospitalist for final goals of care transition -No further intervention at this time from cardiology   Paroxysmal atrial fibrillation History of sinus bradycardia with junctional rhythm -Patient has history of intermittent bradycardia down to 30s in the past, she is no longer taking amiodarone or metoprolol, not on rate slowing agent, rate currently is improved with atropine to 60s, as she continued to deteriorate, bradycardia episode likely can recur, does not appear family wants escalation of care, pacemaker would not  be appropriate in the setting of sepsis with possible recurrence of endocarditis -shall patient is made comfort care, okay to stop Eliquis  Diarrhea Chronic anemia CKD stage  III Hypothyroidism Hyperlipidemia Atrophic gastritis Severe protein malnutrition - management per hospitalist    Risk Assessment/Risk Scores:        New York Heart Association (NYHA) Functional Class NYHA Class IV  CHA2DS2-VASc Score = 6  This indicates a 9.7% annual risk of stroke. The patient's score is based upon: CHF History: Yes HTN History: Yes Diabetes History: No Stroke History: No Vascular Disease History: Yes Age Score: 2 Gender Score: 1        For questions or updates, please contact Lena Please consult [email or web address] for contact info under    Signed, Margie Billet, NP  10/18/2020 1:21 PM   I have seen and examined the patient along with Margie Billet, NP.  I have reviewed the chart, notes and new data.  I agree with PA/NP's note.  Key new complaints: she opens eyes to name, but appears obtunded and cannot respond. History from family. She has stopped eating the lat couple of days. She cannot stand or go to the bathroom, weas a diaper. Key examination changes: appears moribund. Pale, cachectic, cool extremities, nose and earlobes. Apical holosystolic murmur, irregular rhythm. Borderline BP 90-100/60. Was markedly bradycardic earlier, now VR 20s. Was febrile on arrival. Sternotomy scar. Key new findings / data: elevated lactic acid, creatinine above baseline. WBC high. Mild increase in troponin. BNP higher than in March. Reviewed last echo. ECG sinus tachycardia, 1st deg AVB,  LBBB.  PLAN: She appears to have combined septic and cardiogenic shock. Her prognosis is grim. She has been steadily deteriorating for months and now has very poor quality of life. Her sister and her husband both state that she would not want to live longer like this. She is DNR. They have expressed a desire for comfort care. I believe this is appropriate.  Sanda Klein, MD, [place] [phone] 10/24/2020, 3:35 PM

## 2020-09-27 NOTE — Progress Notes (Signed)
Patient was noted to have an acute deterioration in status with systolic blood pressures in the 50s with heart rates temporarily in the 20s.  Husband reports that his wife is to remain DNR.  Patient had been given atropine 1 mg IV and bolused 2 L of normal saline IV fluids.  Cardiology was at bedside and discussed patient's overall poor prognosis with the patient's husband and sister who are present at bedside.  Upon further talks with the family at this time they would like to transition her care to comfort measures only

## 2020-09-27 NOTE — ED Notes (Signed)
MD notified of pt T of 104.1 rectally.

## 2020-09-27 NOTE — ED Notes (Signed)
Pt not responding. MD talking with family at bedside.

## 2020-09-27 NOTE — Plan of Care (Signed)
  Problem: Education: Goal: Knowledge of General Education information will improve Description: Including pain rating scale, medication(s)/side effects and non-pharmacologic comfort measures Outcome: Progressing   Problem: Pain Managment: Goal: General experience of comfort will improve Outcome: Progressing   Problem: Safety: Goal: Ability to remain free from injury will improve Outcome: Progressing   

## 2020-09-27 NOTE — H&P (Addendum)
History and Physical    [name] LOV:[id number] DOB: 03/19/1933 DOA: 10/13/2020  Referring MD/NP/PA: Malvin Johns PCP: [name], MD  Patient coming from: Home via EMS  Chief Complaint: Fever  I have personally briefly reviewed patient's old medical records in [place]   HPI: [name] is a 87 y.o. female with medical history significant of dementia, persistent atrial fibrillation on Eliquis, combined systolic and diastolic CHF who presents with complaints of fever.  History is obtained from review of records and talks with the patient's husband.  She had just recently hospitalized in March for Enterococcus bacteremia with aortic valve endocarditis and fracture of T12-L1 with concern for possible discitis.  Treated with 6 weeks of ampicillin and Rocephin followed in outpatient setting by ID.  Husband noticed that she had been doing well up until last week after getting back from his brothers funeral.  Patient had steadily declined and was sleeping almost all day.  Notes associated symptoms of diarrhea, nausea, vomiting, and chills.  In route with EMS patient was noted to have O2 saturations 80% on room air placed on 6 L nasal cannula oxygen,  febrile up to 102 F, and was confused.  ED Course: Upon admission into the emergency department patient was seen to be 104.1 F, pulse 1 13-1 19, respiration 25-29, blood pressures maintained, and O2 saturations currently able to be maintained on 2 L nasal cannula oxygen.  CT scan of the brain showed no acute intracranial abnormalities.  Labs significant for WBC 12.4, hemoglobin 8.9, BUN 15, creatinine 1.4, AST 42, ALT 23, and lactic acid 4.1.  COVID-19 and influenza screening were negative.  Chest x-ray revealed cardiomegaly with progressive diffuse bilateral pulmonary infiltrates/edema with bilateral pleural effusions left greater than the right.  CT scan of the abdomen and pelvis significant for ascites, and moderate bilateral  pleural effusions, and diverticulosis without signs of diverticulitis.  She had been given 1.5 L of normal saline IV fluids, Tylenol 650 mg rectally, vancomycin, metronidazole, and cefepime.  TRH called to admit.  Review of Systems  Unable to perform ROS: Critical illness (As well as dementia)    Past Medical History:  Diagnosis Date  . Allergy   . Anemia, pernicious   . Arthritis    bursitis   . Atrophic gastritis   . B12 deficiency   . Bacteremia due to Enterococcus 07/09/2020  . CAD (coronary artery disease)   . Carotid artery stenosis   . Cataract   . Diarrhea    on and off for months  . Enterococcus faecalis infection 09/06/2020  . Family history of adverse reaction to anesthesia    extreme nausea brother and sister  . Fibromyalgia   . Gallbladder polyp   . GERD (gastroesophageal reflux disease)   . Heart murmur   . History of hiatal hernia   . History of kidney stones   . HLD (hyperlipidemia)   . HTN (hypertension)   . Hypothyroidism   . Irritable bowel syndrome (IBS)   . Median arcuate ligament syndrome ([place])   . Osteoporosis   . PVD (peripheral vascular disease) ([place])   . Tubular adenoma of colon 08/2013   with focal high grade dysplasia    Past Surgical History:  Procedure Laterality Date  . ABDOMINAL HYSTERECTOMY    . APPENDECTOMY    . BIOPSY THYROID    . CARDIOVERSION N/A 04/03/2020   Procedure: CARDIOVERSION;  Surgeon: Dorothy Spark, MD;  Location: [facility];  Service:  Cardiovascular;  Laterality: N/A;  . cataract surgery    . CHOLECYSTECTOMY N/A 10/24/2014   Procedure: MICROSCOPIC  CHOLECYSTECTOMY;  Surgeon: Ralene Ok, MD;  Location: [facility];  Service: General;  Laterality: N/A;  . COLONOSCOPY    . CORONARY ARTERY BYPASS GRAFT  07  . ESOPHAGEAL MANOMETRY N/A 08/02/2012   Procedure: ESOPHAGEAL MANOMETRY (EM);  Surgeon: Sable Feil, MD;  Location: [facility];  Service: Endoscopy;  Laterality: N/A;  . ESOPHAGOGASTRODUODENOSCOPY (EGD) WITH  PROPOFOL N/A 04/07/2019   Procedure: ESOPHAGOGASTRODUODENOSCOPY (EGD) WITH PROPOFOL;  Surgeon: Milus Banister, MD;  Location: [facility];  Service: Endoscopy;  Laterality: N/A;  . HEMOSTASIS CLIP PLACEMENT  04/07/2019   Procedure: HEMOSTASIS CLIP PLACEMENT;  Surgeon: Milus Banister, MD;  Location: [facility];  Service: Endoscopy;;  . HOT HEMOSTASIS N/A 04/07/2019   Procedure: HOT HEMOSTASIS (ARGON PLASMA COAGULATION/BICAP);  Surgeon: Milus Banister, MD;  Location: [facility];  Service: Endoscopy;  Laterality: N/A;  . POLYPECTOMY  04/07/2019   Procedure: POLYPECTOMY;  Surgeon: Milus Banister, MD;  Location: [facility];  Service: Endoscopy;;  . [name]  04/07/2019   Procedure: [name];  Surgeon: Milus Banister, MD;  Location: [facility];  Service: Endoscopy;;  . TEE WITHOUT CARDIOVERSION N/A 04/03/2020   Procedure: TRANSESOPHAGEAL ECHOCARDIOGRAM (TEE);  Surgeon: Dorothy Spark, MD;  [facility];  Service: Cardiovascular;  Laterality: N/A;  . THYROID LOBECTOMY    . UPPER GASTROINTESTINAL ENDOSCOPY       reports that she has never smoked. She has never used smokeless tobacco. She reports that she does not drink alcohol and does not use drugs.  Allergies  Allergen Reactions  . Actos [Pioglitazone] Swelling    Ankle swelling  . Sulfonamide Derivatives Swelling  . Adhesive [Tape] Other (See Comments)    WITH PROLONGED EXPOSURE (REDNESS/ITCHING)  . Codeine Nausea And Vomiting    Family History  Problem Relation Age of Onset  . Heart disease Mother   . Cholelithiasis Mother   . Prostate cancer Brother   . Colon cancer Brother   . Breast cancer Other        niece - breast cancer  . Diabetes Sister   . Lung cancer Father   . Lung cancer Sister   . Stomach cancer Cousin   . Esophageal cancer Neg Hx   . Rectal cancer Neg Hx     Prior to Admission medications   Medication Sig Start Date End Date Taking? Authorizing Provider  acetaminophen  (TYLENOL) 500 MG tablet Take 2 tablets (1,000 mg total) by mouth every 8 (eight) hours as needed for mild pain. 07/13/20  Yes Ghimire, Henreitta Leber, MD  atorvastatin (LIPITOR) 80 MG tablet Take 0.5 tablets (40 mg total) by mouth at bedtime. 05/29/20  Yes Burtis Junes, NP  Calcium Carbonate-Vitamin D (CALTRATE 600+D PO) Take 1 tablet by mouth daily with lunch.    Yes [provider]  ELIQUIS 2.5 MG TABS tablet TAKE 1 TABLET BY MOUTH TWICE DAILY Patient taking differently: Take 2.5 mg by mouth 2 (two) times daily. 06/28/20  Yes Burchette, Alinda Sierras, MD  gabapentin (NEURONTIN) 100 MG capsule Take 1 capsule (100 mg total) by mouth 2 (two) times daily. 01/24/20  Yes Burchette, Alinda Sierras, MD  Infant Care Products [facility]) OINT Apply 1 application topically 2 (two) times daily as needed (skin care).   Yes [provider]  ipratropium (ATROVENT) 0.03 % nasal spray Place 2 sprays into both nostrils every 12 (twelve) hours.  Yes [provider]  lactase (LACTAID) 3000 units tablet Take 3,000 Units by mouth every 6 (six) hours as needed (lactose Intolerance).   Yes [provider]  lactose free nutrition (BOOST PLUS) LIQD Take 237 mLs by mouth 3 (three) times daily with meals. 07/13/20  Yes Ghimire, Henreitta Leber, MD  levothyroxine (SYNTHROID) 75 MCG tablet Take 1 tablet (75 mcg total) by mouth daily at 6 (six) AM. Patient taking differently: Take 50 mcg by mouth daily before breakfast. 07/28/20  Yes Mikhail, Velta Addison, DO  Lidocaine 4 % PTCH Place 1 patch onto the skin daily as needed (pain.).    Yes [provider]  Lidocaine-Menthol (LIDOCAINE-MENTHOL ROLL-ON) 4-1 % LIQD Apply 1 application topically every 8 (eight) hours as needed (pain).   Yes [provider]  metoprolol succinate (TOPROL-XL) 25 MG 24 hr tablet Take 25 mg by mouth daily.   Yes [provider]  Multiple Vitamins-Minerals (PRESERVISION AREDS 2 PO) Take 1 tablet by mouth 2 (two) times daily.    Yes [provider]  nitroGLYCERIN (NITROSTAT) 0.4 MG SL tablet Place 0.4 mg under the tongue every 5 (five) minutes as needed for chest pain.   Yes [provider]  oxyCODONE (OXY IR/ROXICODONE) 5 MG immediate release tablet Take 1 tablet (5 mg total) by mouth every 6 (six) hours as needed for severe pain. 09/10/20  Yes Burchette, Alinda Sierras, MD  Polyethyl Glycol-Propyl Glycol (SYSTANE FREE OP) Place 1 drop into both eyes every 8 (eight) hours as needed (dry eyes).   Yes [provider]  polyethylene glycol (MIRALAX / GLYCOLAX) 17 g packet Take 17 g by mouth daily. 07/14/20  Yes Ghimire, Henreitta Leber, MD  sennosides-docusate sodium (SENOKOT-S) 8.6-50 MG tablet Take 1 tablet by mouth daily.   Yes [provider]  sodium chloride (OCEAN) 0.65 % SOLN nasal spray Place 1 spray into both nostrils daily as needed for congestion.   Yes [provider]  spironolactone (ALDACTONE) 25 MG tablet Take 25 mg by mouth daily. 09/12/20  Yes [provider]  traZODone (DESYREL) 50 MG tablet Take 25 mg by mouth at bedtime as needed for sleep.   Yes [provider]  triamcinolone cream (KENALOG) 0.1 % Apply 1 application topically 2 (two) times daily. 09/25/20  Yes Burchette, Alinda Sierras, MD  sucralfate (CARAFATE) 1 GM/10ML suspension TAKE 10 MLS (1 G TOTAL) BY MOUTH 2 (TWO) TIMES DAILY. Patient not taking: No sig reported 08/23/19   [name], MD    Physical Exam:  Constitutional: Elderly female who appears to be significantly ill with eyes closed but we will open to verbal stimuli. Vitals:   09/30/2020 0830 10/15/2020 0845 10/09/2020 0900 10/18/2020 0911  BP: 134/62 130/63 124/61   Pulse: (!) 113 (!) 114 (!) 113   Resp: (!) 26 (!) 29 (!) 27   Temp:    98.5 F (36.9 C)  TempSrc:    Oral  SpO2: 100% 100% 100%    Eyes: PERRL, lids and conjunctivae normal ENMT: Mucous membranes are dry.  Posterior pharynx clear of any exudate or lesions.  Neck: normal,  supple, no masses, no thyromegaly.  JVD present. Respiratory: Tachypneic with decreased breath sounds in the lower lung fields.  Currently on 6 L nasal cannula oxygen with O2 saturations maintained. Cardiovascular: Tachycardic, positive murmur. No extremity edema. 2+ pedal pulses. No carotid bruits.  Abdomen: no tenderness, no masses palpated. No hepatosplenomegaly. Bowel sounds positive.  Musculoskeletal: no clubbing / cyanosis. No joint deformity  upper and lower extremities. Good ROM, no contractures. Normal muscle tone.  Skin: Pallor present.  Healing scab of the dorsum of the right foot without erythema appreciated. Neurologic: CN 2-12 grossly intact.  Moves all extremities. Psychiatric: Poor memory.  Lethargic, but will answer to verbal stimuli from husband.      Labs on Admission: I have personally reviewed following labs and imaging studies  CBC: Recent Labs  Lab 10/02/2020 0701  WBC 12.4*  NEUTROABS 11.4*  HGB 8.9*  HCT 30.6*  MCV 78.5*  PLT 939   Basic Metabolic Panel: Recent Labs  Lab 09/25/20 1511 10/05/2020 0701  NA 140 136  K 4.7 4.5  CL 106 103  CO2 25 19*  GLUCOSE 102* 162*  BUN 15 15  CREATININE 1.17 1.41*  CALCIUM 8.8 8.6*   GFR: Estimated Creatinine Clearance: 20.5 mL/min (A) (by C-G formula based on SCr of 1.41 mg/dL (H)). Liver Function Tests: Recent Labs  Lab 10/24/2020 0701  AST 42*  ALT 23  ALKPHOS 67  BILITOT 0.6  PROT 6.5  ALBUMIN 3.1*   No results for input(s): LIPASE, AMYLASE in the last 168 hours. No results for input(s): AMMONIA in the last 168 hours. Coagulation Profile: Recent Labs  Lab 10/16/2020 0701  INR 1.4*   Cardiac Enzymes: No results for input(s): CKTOTAL, CKMB, CKMBINDEX, TROPONINI in the last 168 hours. BNP (last 3 results) No results for input(s): PROBNP in the last 8760 hours. HbA1C: No results for input(s): HGBA1C in the last 72 hours. CBG: No results for input(s): GLUCAP in the last 168 hours. Lipid Profile: No  results for input(s): CHOL, HDL, LDLCALC, TRIG, CHOLHDL, LDLDIRECT in the last 72 hours. Thyroid Function Tests: No results for input(s): TSH, T4TOTAL, FREET4, T3FREE, THYROIDAB in the last 72 hours. Anemia Panel: No results for input(s): VITAMINB12, FOLATE, FERRITIN, TIBC, IRON, RETICCTPCT in the last 72 hours. Urine analysis:    Component Value Date/Time   COLORURINE YELLOW 09/25/2020 1511   APPEARANCEUR Sl Cloud[place] (A) 09/25/2020 1511   LABSPEC 1.020 09/25/2020 1511   PHURINE 6.0 09/25/2020 1511   GLUCOSEU NEGATIVE 09/25/2020 1511   HGBUR TRACE-INTACT (A) 09/25/2020 1511   HGBUR negative 07/30/2009 1606   BILIRUBINUR NEGATIVE 09/25/2020 1511   BILIRUBINUR negative 12/05/2019 1535   KETONESUR NEGATIVE 09/25/2020 1511   PROTEINUR NEGATIVE 07/22/2020 1910   UROBILINOGEN 0.2 09/25/2020 1511   NITRITE POSITIVE (A) 09/25/2020 1511   LEUKOCYTESUR SMALL (A) 09/25/2020 1511   Sepsis Labs: Recent Results (from the past 240 hour(s))  Resp Panel by RT-PCR (Flu A&B, Covid) Nasopharyngeal Swab     Status: None   Collection Time: 10/15/2020  7:10 AM   Specimen: Nasopharyngeal Swab; Nasopharyngeal(NP) swabs in vial transport medium  Result Value Ref Range Status   SARS Coronavirus 2 by RT PCR NEGATIVE NEGATIVE Final    Comment: (NOTE) SARS-CoV-2 target nucleic acids are NOT DETECTED.  The SARS-CoV-2 RNA is generally detectable in upper respiratory specimens during the acute phase of infection. The lowest concentration of SARS-CoV-2 viral copies this assay can detect is 138 copies/mL. A negative result does not preclude SARS-Cov-2 infection and should not be used as the sole basis for treatment or other patient management decisions. A negative result may occur with  improper specimen collection/handling, submission of specimen other than nasopharyngeal swab, presence of viral mutation(s) within the areas targeted by this assay, and inadequate number of viral copies(<138 copies/mL). A negative  result must be combined with clinical observations, patient history, and epidemiological information.  The expected result is Negative.  Fact Sheet for Patients:  EntrepreneurPulse.com.au  Fact Sheet for Healthcare Providers:  IncredibleEmployment.be  This test is no t yet approved or cleared by the [place] FDA and  has been authorized for detection and/or diagnosis of SARS-CoV-2 by FDA under an Emergency Use Authorization (EUA). This EUA will remain  in effect (meaning this test can be used) for the duration of the COVID-19 declaration under Section 564(b)(1) of the Act, 21 U.S.C.section 360bbb-3(b)(1), unless the authorization is terminated  or revoked sooner.       Influenza A by PCR NEGATIVE NEGATIVE Final   Influenza B by PCR NEGATIVE NEGATIVE Final    Comment: (NOTE) The Xpert Xpress SARS-CoV-2/FLU/RSV plus assay is intended as an aid in the diagnosis of influenza from Nasopharyngeal swab specimens and should not be used as a sole basis for treatment. Nasal washings and aspirates are unacceptable for Xpert Xpress SARS-CoV-2/FLU/RSV testing.  Fact Sheet for Patients: EntrepreneurPulse.com.au  Fact Sheet for Healthcare Providers: IncredibleEmployment.be  This test is not yet approved or cleared by the [place] FDA and has been authorized for detection and/or diagnosis of SARS-CoV-2 by FDA under an Emergency Use Authorization (EUA). This EUA will remain in effect (meaning this test can be used) for the duration of the COVID-19 declaration under Section 564(b)(1) of the Act, 21 U.S.C. section 360bbb-3(b)(1), unless the authorization is terminated or revoked.  Performed at [facility], [place] [address] [place],  [address]      Radiological Exams on Admission: CT Abdomen Pelvis Wo Contrast  Result Date: 10/24/2020 CLINICAL DATA:  87 year old female with history of abdominal  pain and fever. Diarrhea. EXAM: CT ABDOMEN AND PELVIS WITHOUT CONTRAST TECHNIQUE: Multidetector CT imaging of the abdomen and pelvis was performed following the standard protocol without IV contrast. COMPARISON:  CT the abdomen and pelvis 07/04/2020. FINDINGS: Lower chest: Moderate bilateral pleural effusions. Dependent areas of atelectasis are noted in the lung bases bilaterally. Mild cardiomegaly. Atherosclerotic calcifications in the descending thoracic aorta. Hepatobiliary: No definite suspicious cystic or solid hepatic lesions are confidently identified on today's noncontrast CT examination. Gallbladder is not visualized, likely surgically absent (no surgical clips are noted in the gallbladder fossa). Pancreas: No definite pancreatic mass or peripancreatic fluid collections or inflammatory changes. Spleen: Unremarkable. Adrenals/Urinary Tract: 2 mm nonobstructive calculus in the interpolar collecting system of the left kidney. Exophytic 2 cm low-attenuation lesion in the anterior aspect of the interpolar region of the left kidney (axial image 24 of series 3) measuring 2.1 cm in diameter, incompletely characterized on today's non-contrast CT examination, but previously characterized as a simple cyst. Mild parenchymal atrophy in both kidneys. Unenhanced appearance of the right kidney and bilateral adrenal glands is otherwise normal. No hydroureteronephrosis. Urinary bladder is normal in appearance. Stomach/Bowel: Unenhanced appearance of the stomach is normal. No pathologic dilatation of small bowel or colon. Numerous colonic diverticulae are noted, particularly in the sigmoid colon, without surrounding inflammatory changes to suggest an acute diverticulitis at this time. The appendix is not confidently identified and may be surgically absent. Regardless, there are no inflammatory changes noted adjacent to the cecum to suggest the presence of an acute appendicitis at this time. Vascular/Lymphatic: Aortic  atherosclerosis. No lymphadenopathy noted in the abdomen or pelvis. Reproductive: Status post hysterectomy.  Ovaries are trophic. Other: Small volume of ascites.  No pneumoperitoneum. Musculoskeletal: There are no aggressive appearing lytic or blastic lesions noted in the visualized portions of the skeleton. Chronic compression fracture of  L1 with 60% loss of anterior vertebral body height, similar to the prior study. IMPRESSION: 1. Small volume of ascites. No other acute findings are noted in the abdomen or pelvis. 2. Moderate bilateral pleural effusions with bibasilar areas of atelectasis in the lung bases. 3. 2 mm nonobstructive calculus in the interpolar collecting system of left kidney. 4. Colonic diverticulosis without definite imaging findings to suggest an acute diverticulitis at this time. 5. Additional incidental findings, as above. Electronically Signed   By: Vinnie Langton M.D.   On: 10/24/2020 08:03   DG Chest 2 View  Result Date: 10/14/2020 CLINICAL DATA:  Suspected sepsis. EXAM: CHEST - 2 VIEW COMPARISON:  07/22/2020. FINDINGS: Prior CABG. Cardiomegaly. Progressive diffuse bilateral pulmonary interstitial infiltrates/edema. Bilateral pleural effusions, left side greater than right. Findings suggest CHF. Bilateral pneumonia cannot be excluded. No pneumothorax. Degenerative change thoracic spine. IMPRESSION: Prior CABG. Cardiomegaly. Progressive diffuse bilateral pulmonary infiltrates/edema. Bilateral pleural effusions, left side greater than right. Findings suggest CHF. Bilateral pneumonia cannot be excluded. Electronically Signed   By: Marcello Moores  Register   On: 10/02/2020 07:31   CT Head Wo Contrast  Result Date: 10/08/2020 CLINICAL DATA:  87 year old female with history of mental status change. EXAM: CT HEAD WITHOUT CONTRAST TECHNIQUE: Contiguous axial images were obtained from the base of the skull through the vertex without intravenous contrast. COMPARISON:  Head CT 07/04/2020. FINDINGS:  Brain: Severe cerebral and cerebellar atrophy again noted. Patchy and confluent areas of decreased attenuation are noted throughout the deep and periventricular white matter of the cerebral hemispheres bilaterally, compatible with chronic microvascular ischemic disease. Physiologic calcifications are noted in the basal ganglia bilaterally. No evidence of acute infarction, hemorrhage, hydrocephalus, extra-axial collection or mass lesion/mass effect. Vascular: No hyperdense vessel or unexpected calcification. Skull: Normal. Negative for fracture or focal lesion. Sinuses/Orbits: Chronic opacification of the left frontal sinus, similar to the prior study. Other: None. IMPRESSION: 1. No acute intracranial abnormalities. 2. Severe cerebral and cerebellar atrophy with chronic microvascular ischemic changes in the cerebral white matter, as above. 3. Chronic left frontal paranasal sinus disease redemonstrated. Electronically Signed   By: Vinnie Langton M.D.   On: 10/10/2020 07:51    EKG: Independently reviewed.  Tachycardia 118 bpm with LBBB  Assessment/Plan Severe sepsis: Acute.  Patient presented with fever up to 104.1 F with tachycardia and tachypnea.  Labs significant for WBC 12.4 with initial lactic acid 4.1.  PCP had done a urinalysis on 5/31 which showed positive nitrates, small leukocytes, and many bacteria.  Blood and urine cultures have been obtained.  Patient was started on empiric antibiotics of vancomycin, cefepime, and metronidazole. -Admit to a progressive bed -Follow-up blood and urine cultures -Continue empiric antibiotics of vancomycin and cefepime -Tylenol as needed for fever -Trend lactic acid levels  Respiratory failure secondary to combined systolic and diastolic congestive heart failure exacerbation Aortic insufficiency and mitral regurgitation: Acute on chronic.  Patient with O2 saturations initially noted to be in the 80s on room air with improvement on 6 L of nasal cannula oxygen.   On physical exam patient with positive JVD and decreased breath sounds.  Imaging studies significant for cardiomegaly with diffuse bilateral pulmonary infiltrates and moderate pleural effusions on chest x-ray.  Last echocardiogram revealed EF of 50% with grade 2 diastolic dysfunction with severe mitral valve regurgitation concerning for P2 prolapse versus partial flail and moderate to severe aortic insufficiency. -Continue with nasal cannula oxygen to maintain O2 saturations -Strict I&O's -Daily weights -Check BNP -Need of repeat echocardiogram per  cardiology -Initially held spironolactone in the setting of severe sepsis with elevated lactic acid  -Cardiology formally consulted, will follow-up for further recommendation  Nausea, vomiting, diarrhea: Patient husband noticed that the patient had recently had nausea, vomiting, and diarrhea.  Prior history of lymphocytic colitis, but CT scan did not note any acute abnormalities.  No reports of blood present.  She had recently completed antibiotics for Enterococcus bacteremia. -Antiemetics as needed -Check stools for the possibility of C. difficile  History of Enterococcus bacteremia with aortic valve endocarditis less concern for T12/L1 discitis: Patient during last hospitalization in March noted to have a 0.5 x 0.9 cm mobile vegetation on the aortic valve.  Patient was treated with 6 weeks of IV Rocephin and ampicillin. -May warrant formal consult to ID  Anemia of chronic disease: Hemoglobin 8.9 which appears around patient's baseline. -Continue to monitor  Paroxysmal atrial fibrillation: At home patient is on Eliquis and appears to still be on metoprolol.  And had been during last admission due to bradycardia. -Continue Eliquis and metoprolol diastolic  Essential hypertension: Blood pressures currently stable -Continue metoprolol as tolerated  Dementia: At baseline patient is alert and oriented to self and place per husband. -Delirium  precaution -Set bed alarm on  Chronic kidney disease stage IIIb: Creatinine 1.41 which appears slightly higher than previous 1.17 on 09/25/2020, but does not signify an acute kidney injury. -Continue to monitor kidney function daily  Hypothyroidism: Last TSH was last 1.75 on 09/10/2020. -Continue levothyroxine  Hyperlipidemia -Continue atorvastatin  Elevated AST  History of atrophic gastritis -Continue PPI  Severe malnutrition underweight: Acute on chronic.  Patient's BMI previously had been around 18.39 kg/m  during her last admission in March, but presents with BMI currently 17.36 kg/m. -Continue boost  DNR: Confirmed by husband.  DVT prophylaxis: Eliquis Code Status: DNR Family Communication: Husband updated at bedside Disposition Plan: To be determined Consults called: Cardiology Admission status: Inpatient, require more than 2 midnight stay due to need for IV antibiotics  Norval Morton MD [facility]   If 7PM-7AM, please contact night-coverage   10/05/2020, 9:14 AM

## 2020-09-27 NOTE — ED Notes (Signed)
Notified MD that pt is feeling very nauseous and troponin 588. Md will place order for IV zofran

## 2020-09-27 NOTE — ED Notes (Signed)
Dr.Belfi at bedside when pt IV to left [facility] placed by EMS infiltrated. Appeared swollen and catheter intact. IV removed abruptly and left arm wrapped in ACE bandage to reduce swelling. Ice pack and heat pack rotated to help reduce swelling. Will continue to monitor. Intact pulses to left radial and left brachial at this time. Pt complains of mild pain to left arm at 3/10 pain scale.

## 2020-09-28 DIAGNOSIS — I5043 Acute on chronic combined systolic (congestive) and diastolic (congestive) heart failure: Secondary | ICD-10-CM | POA: Diagnosis not present

## 2020-09-28 DIAGNOSIS — A409 Streptococcal sepsis, unspecified: Principal | ICD-10-CM

## 2020-09-28 DIAGNOSIS — R001 Bradycardia, unspecified: Secondary | ICD-10-CM

## 2020-09-28 DIAGNOSIS — Z515 Encounter for palliative care: Secondary | ICD-10-CM

## 2020-09-28 DIAGNOSIS — J9621 Acute and chronic respiratory failure with hypoxia: Secondary | ICD-10-CM | POA: Diagnosis not present

## 2020-09-28 DIAGNOSIS — R778 Other specified abnormalities of plasma proteins: Secondary | ICD-10-CM

## 2020-09-28 DIAGNOSIS — I2510 Atherosclerotic heart disease of native coronary artery without angina pectoris: Secondary | ICD-10-CM

## 2020-09-28 DIAGNOSIS — I480 Paroxysmal atrial fibrillation: Secondary | ICD-10-CM | POA: Diagnosis not present

## 2020-09-28 DIAGNOSIS — Z66 Do not resuscitate: Secondary | ICD-10-CM

## 2020-09-28 DIAGNOSIS — R6521 Severe sepsis with septic shock: Secondary | ICD-10-CM

## 2020-09-30 LAB — CULTURE, BLOOD (ROUTINE X 2): Special Requests: ADEQUATE

## 2020-10-01 DIAGNOSIS — R001 Bradycardia, unspecified: Secondary | ICD-10-CM | POA: Diagnosis present

## 2020-10-01 DIAGNOSIS — R778 Other specified abnormalities of plasma proteins: Secondary | ICD-10-CM | POA: Diagnosis present

## 2020-10-01 DIAGNOSIS — R7989 Other specified abnormal findings of blood chemistry: Secondary | ICD-10-CM | POA: Diagnosis present

## 2020-10-05 ENCOUNTER — Telehealth: Payer: Medicare Other

## 2020-10-26 ENCOUNTER — Ambulatory Visit: Payer: Medicare Other | Admitting: Family

## 2020-10-26 NOTE — Discharge Summary (Addendum)
Death Summary  [name] WNI:[id number] DOB: [birth year] DOA: 10-10-20  PCP: [name], MD  Admit date: October 10, 2020 Date of Death: 10-11-2020 Approximate Time of Death: 2:09 Notification: [name], MD notified of death of 10/14/20   History of present illness:  [name] is a 87 y.o. female with a history of dementia, persistent a fib., CAD s/p CABG, CHF, atrophic gastritis, severe protein malnutrition, HTN, HLD, hypothyroid, CKD III, chronic anemia, discitis, and Enterococcus bacteremia with aortic valve endocarditis most recently in 2022/07/28 treated with 6-week course of antibiotics.  Patient was not a surgical candidate for the aortic valve vegetation. [name] presented with complaint of fever found to be septic possibly from urinary tract infection. [name] was seen to be febrile up to 104.1 F with tachycardia and tachypnea on admission.  Labs were significant for WBC 12.4 and initial lactic acid of 4.1.  Chest x-ray showed cardiomegaly with diffuse bilateral pulmonary infiltrates/edema and bilateral pleural effusions.  She had had a positive urinalysis at her PCP office on 5/31 which showed small leukocytes, positive nitrites, many bacteria, and 11-20 WBCs/hpf which were thought to be a possible source. Given her recent hospitalization with endocarditis other causes were not excluded.  She had been given initial boluses of fluid, Tylenol, vancomycin, metronidazole, and cefepime showing some initial improvement in overall clinical presentation.  CT scan of the abdomen and pelvis was obtained, but did not note any acute intra-abdominal findings and showed bilateral moderate pleural effusions.  Patient was thought to be fluid overloaded and BNP was elevated at 1928.2.  High-sensitivity troponins were elevated at 588->724.  Patient started to complain of back pain seem to be deteriorating.  Initial blood pressures which had been elevated, but started to trend  down 70s followed by heart rates dropping into the 30s.  She became unresponsive around 2 PM.  Patient was given 1 mg of atropine IV and bolused an additional 2 L of normal saline IV fluids.  Heart rates improved into the 60s and blood pressures sustaining maps greater than 65 temporarily.  Patient DNR status was confirmed by the husband present at bedside.  Cardiology evaluated patient discussing poor overall prognosis with husband given her acute on chronic systolic and diastolic congestive heart failure with , severe aortic insufficiency, and septic shock.  She is not a candidate for diuresis and pacemaker would be able to be performed in the setting of sepsis with possible reoccurrence of endocarditis.  Patient family agreed to comfort care measures only.  All invasive testing, medications, and therapies were discontinued.  Urine cultures never been able to be obtained.  Palliative care had been consulted to assist.  Patient was noted to have expired around 2:09 AM the following morning.  Blood cultures did eventually reveal Viridans Streptococcus.  Final Diagnoses:   Septic shock secondary to Viridans Streptococcus bacteremia  Aortic valve endocarditis  Acute on chronic combined systolic and diastolic congestive heart failure  Bradycardia  Severe aortic insufficiency   Severe mitral regurgitation elevated troponin  Elevated troponin  CAD with history of CABG 2005/07/27  Paroxysmal atrial fibrillation  Severe protein calorie malnutrition  Hypothyroidism  Chronic kidney disease stage III  Dementia  Anemia of chronic disease   DNR  Comfort measures only   The results of significant diagnostics from this hospitalization (including imaging, microbiology, ancillary and laboratory) are listed below for reference.    Significant Diagnostic Studies: CT Abdomen Pelvis Wo Contrast  Result Date: 10-10-20 CLINICAL  DATA:  87 year old female with history of abdominal pain and fever.  Diarrhea. EXAM: CT ABDOMEN AND PELVIS WITHOUT CONTRAST TECHNIQUE: Multidetector CT imaging of the abdomen and pelvis was performed following the standard protocol without IV contrast. COMPARISON:  CT the abdomen and pelvis 07/04/2020. FINDINGS: Lower chest: Moderate bilateral pleural effusions. Dependent areas of atelectasis are noted in the lung bases bilaterally. Mild cardiomegaly. Atherosclerotic calcifications in the descending thoracic aorta. Hepatobiliary: No definite suspicious cystic or solid hepatic lesions are confidently identified on today's noncontrast CT examination. Gallbladder is not visualized, likely surgically absent (no surgical clips are noted in the gallbladder fossa). Pancreas: No definite pancreatic mass or peripancreatic fluid collections or inflammatory changes. Spleen: Unremarkable. Adrenals/Urinary Tract: 2 mm nonobstructive calculus in the interpolar collecting system of the left kidney. Exophytic 2 cm low-attenuation lesion in the anterior aspect of the interpolar region of the left kidney (axial image 24 of series 3) measuring 2.1 cm in diameter, incompletely characterized on today's non-contrast CT examination, but previously characterized as a simple cyst. Mild parenchymal atrophy in both kidneys. Unenhanced appearance of the right kidney and bilateral adrenal glands is otherwise normal. No hydroureteronephrosis. Urinary bladder is normal in appearance. Stomach/Bowel: Unenhanced appearance of the stomach is normal. No pathologic dilatation of small bowel or colon. Numerous colonic diverticulae are noted, particularly in the sigmoid colon, without surrounding inflammatory changes to suggest an acute diverticulitis at this time. The appendix is not confidently identified and may be surgically absent. Regardless, there are no inflammatory changes noted adjacent to the cecum to suggest the presence of an acute appendicitis at this time. Vascular/Lymphatic: Aortic atherosclerosis. No  lymphadenopathy noted in the abdomen or pelvis. Reproductive: Status post hysterectomy.  Ovaries are trophic. Other: Small volume of ascites.  No pneumoperitoneum. Musculoskeletal: There are no aggressive appearing lytic or blastic lesions noted in the visualized portions of the skeleton. Chronic compression fracture of L1 with 60% loss of anterior vertebral body height, similar to the prior study. IMPRESSION: 1. Small volume of ascites. No other acute findings are noted in the abdomen or pelvis. 2. Moderate bilateral pleural effusions with bibasilar areas of atelectasis in the lung bases. 3. 2 mm nonobstructive calculus in the interpolar collecting system of left kidney. 4. Colonic diverticulosis without definite imaging findings to suggest an acute diverticulitis at this time. 5. Additional incidental findings, as above. Electronically Signed   By: Vinnie Langton M.D.   On: 10/19/2020 08:03   DG Chest 2 View  Result Date: 10/09/2020 CLINICAL DATA:  Suspected sepsis. EXAM: CHEST - 2 VIEW COMPARISON:  07/22/2020. FINDINGS: Prior CABG. Cardiomegaly. Progressive diffuse bilateral pulmonary interstitial infiltrates/edema. Bilateral pleural effusions, left side greater than right. Findings suggest CHF. Bilateral pneumonia cannot be excluded. No pneumothorax. Degenerative change thoracic spine. IMPRESSION: Prior CABG. Cardiomegaly. Progressive diffuse bilateral pulmonary infiltrates/edema. Bilateral pleural effusions, left side greater than right. Findings suggest CHF. Bilateral pneumonia cannot be excluded. Electronically Signed   By: Marcello Moores  Register   On: 10/10/2020 07:31   CT Head Wo Contrast  Result Date: 10/19/2020 CLINICAL DATA:  87 year old female with history of mental status change. EXAM: CT HEAD WITHOUT CONTRAST TECHNIQUE: Contiguous axial images were obtained from the base of the skull through the vertex without intravenous contrast. COMPARISON:  Head CT 07/04/2020. FINDINGS: Brain: Severe cerebral  and cerebellar atrophy again noted. Patchy and confluent areas of decreased attenuation are noted throughout the deep and periventricular white matter of the cerebral hemispheres bilaterally, compatible with chronic microvascular ischemic disease. Physiologic calcifications are  noted in the basal ganglia bilaterally. No evidence of acute infarction, hemorrhage, hydrocephalus, extra-axial collection or mass lesion/mass effect. Vascular: No hyperdense vessel or unexpected calcification. Skull: Normal. Negative for fracture or focal lesion. Sinuses/Orbits: Chronic opacification of the left frontal sinus, similar to the prior study. Other: None. IMPRESSION: 1. No acute intracranial abnormalities. 2. Severe cerebral and cerebellar atrophy with chronic microvascular ischemic changes in the cerebral white matter, as above. 3. Chronic left frontal paranasal sinus disease redemonstrated. Electronically Signed   By: Vinnie Langton M.D.   On: 10/22/2020 07:51   US Renal  Result Date: 09/22/2020 CLINICAL DATA:  Acute renal insufficiency EXAM: RENAL / URINARY TRACT ULTRASOUND COMPLETE COMPARISON:  11/22/2013, CT 07/04/2020 FINDINGS: Right Kidney: Renal measurements: 9.8 x 4.6 x 4.8 cm = volume: 113 mL. Cortical echogenicity within normal limits. No hydronephrosis. Mild cortical thinning at the poles. Small cyst at the midpole measuring 1.4 cm. Left Kidney: Renal measurements: 10.6 x 3.9 x 4.4 cm = volume: 94 mL. Cortex is slightly echogenic. Cortical thinning is present. No hydronephrosis. Cyst at the midpole measuring 1.7 cm Bladder: Appears normal for degree of bladder distention. Other: None. IMPRESSION: 1. Negative for hydronephrosis. 2. Slightly echogenic left kidney with bilateral renal cortical thinning consistent with medical renal disease 3. Bilateral renal cysts Electronically Signed   By: Donavan Foil M.D.   On: 09/22/2020 01:41    Microbiology: Recent Results (from the past 240 hour(s))  Culture, blood  (Routine x 2)     Status: Abnormal   Collection Time: 10/05/2020  7:01 AM   Specimen: Right Antecubital; Blood  Result Value Ref Range Status   Specimen Description RIGHT ANTECUBITAL  Final   Special Requests   Final    BOTTLES DRAWN AEROBIC AND ANAEROBIC Blood Culture adequate volume   Culture  Setup Time   Final    GRAM POSITIVE COCCI ANAEROBIC BOTTLE ONLY PATIENT DISCHARGED OR EXPIRED    Culture (A)  Final    VIRIDANS STREPTOCOCCUS THE SIGNIFICANCE OF ISOLATING THIS ORGANISM FROM A SINGLE VENIPUNCTURE CANNOT BE PREDICTED WITHOUT FURTHER CLINICAL AND CULTURE CORRELATION. SUSCEPTIBILITIES AVAILABLE ONLY ON REQUEST. Performed at [facility], [place] [address] [place], [place] [address]    Report Status 09/30/2020 FINAL  Final  Resp Panel by RT-PCR (Flu A&B, Covid) Nasopharyngeal Swab     Status: None   Collection Time: 09/29/2020  7:10 AM   Specimen: Nasopharyngeal Swab; Nasopharyngeal(NP) swabs in vial transport medium  Result Value Ref Range Status   SARS Coronavirus 2 by RT PCR NEGATIVE NEGATIVE Final    Comment: (NOTE) SARS-CoV-2 target nucleic acids are NOT DETECTED.  The SARS-CoV-2 RNA is generally detectable in upper respiratory specimens during the acute phase of infection. The lowest concentration of SARS-CoV-2 viral copies this assay can detect is 138 copies/mL. A negative result does not preclude SARS-Cov-2 infection and should not be used as the sole basis for treatment or other patient management decisions. A negative result may occur with  improper specimen collection/handling, submission of specimen other than nasopharyngeal swab, presence of viral mutation(s) within the areas targeted by this assay, and inadequate number of viral copies(<138 copies/mL). A negative result must be combined with clinical observations, patient history, and epidemiological information. The expected result is Negative.  Fact Sheet for Patients:   EntrepreneurPulse.com.au  Fact Sheet for Healthcare Providers:  IncredibleEmployment.be  This test is no t yet approved or cleared by the [place] FDA and  has been authorized for detection and/or diagnosis  of SARS-CoV-2 by FDA under an Emergency Use Authorization (EUA). This EUA will remain  in effect (meaning this test can be used) for the duration of the COVID-19 declaration under Section 564(b)(1) of the Act, 21 U.S.C.section 360bbb-3(b)(1), unless the authorization is terminated  or revoked sooner.       Influenza A by PCR NEGATIVE NEGATIVE Final   Influenza B by PCR NEGATIVE NEGATIVE Final    Comment: (NOTE) The Xpert Xpress SARS-CoV-2/FLU/RSV plus assay is intended as an aid in the diagnosis of influenza from Nasopharyngeal swab specimens and should not be used as a sole basis for treatment. Nasal washings and aspirates are unacceptable for Xpert Xpress SARS-CoV-2/FLU/RSV testing.  Fact Sheet for Patients: EntrepreneurPulse.com.au  Fact Sheet for Healthcare Providers: IncredibleEmployment.be  This test is not yet approved or cleared by the [place] FDA and has been authorized for detection and/or diagnosis of SARS-CoV-2 by FDA under an Emergency Use Authorization (EUA). This EUA will remain in effect (meaning this test can be used) for the duration of the COVID-19 declaration under Section 564(b)(1) of the Act, 21 U.S.C. section 360bbb-3(b)(1), unless the authorization is terminated or revoked.  Performed at [facility], [place] [address], [place], [place] [address]      Labs: Basic Metabolic Panel: Recent Labs  Lab 09/25/20 1511 10/24/2020 0701  NA 140 136  K 4.7 4.5  CL 106 103  CO2 25 19*  GLUCOSE 102* 162*  BUN 15 15  CREATININE 1.17 1.41*  CALCIUM 8.8 8.6*   Liver Function Tests: Recent Labs  Lab 10/16/2020 0701  AST 42*  ALT 23  ALKPHOS 67  BILITOT 0.6  PROT  6.5  ALBUMIN 3.1*   No results for input(s): LIPASE, AMYLASE in the last 168 hours. No results for input(s): AMMONIA in the last 168 hours. CBC: Recent Labs  Lab 10/15/2020 0701  WBC 12.4*  NEUTROABS 11.4*  HGB 8.9*  HCT 30.6*  MCV 78.5*  PLT 201   Cardiac Enzymes: No results for input(s): CKTOTAL, CKMB, CKMBINDEX, TROPONINI in the last 168 hours. D-Dimer No results for input(s): DDIMER in the last 72 hours. BNP: Invalid input(s): POCBNP CBG: No results for input(s): GLUCAP in the last 168 hours. Anemia work up No results for input(s): VITAMINB12, FOLATE, FERRITIN, TIBC, IRON, RETICCTPCT in the last 72 hours. Urinalysis    Component Value Date/Time   COLORURINE YELLOW 09/25/2020 1511   APPEARANCEUR Sl Cloudy (A) 09/25/2020 1511   LABSPEC 1.020 09/25/2020 1511   PHURINE 6.0 09/25/2020 1511   GLUCOSEU NEGATIVE 09/25/2020 1511   HGBUR TRACE-INTACT (A) 09/25/2020 1511   HGBUR negative 07/30/2009 1606   BILIRUBINUR NEGATIVE 09/25/2020 1511   BILIRUBINUR negative 12/05/2019 1535   KETONESUR NEGATIVE 09/25/2020 1511   PROTEINUR NEGATIVE 07/22/2020 1910   UROBILINOGEN 0.2 09/25/2020 1511   NITRITE POSITIVE (A) 09/25/2020 1511   LEUKOCYTESUR SMALL (A) 09/25/2020 1511   Sepsis Labs Invalid input(s): PROCALCITONIN,  WBC,  LACTICIDVEN     SIGNED:  Norval Morton, MD  [facility] 10/01/2020, 7:14 AM Pager   If 7PM-7AM, please contact night-coverage [email or web address] Password [email or web address]

## 2020-10-26 NOTE — Progress Notes (Signed)
Rounding done by nurse and patient noted to be expired. Verified by nurse x2. Spouse at bedside. MD made aware.

## 2020-10-26 DEATH — deceased

## 2020-11-26 ENCOUNTER — Ambulatory Visit: Payer: Medicare Other | Admitting: Infectious Disease

## 2021-09-21 IMAGING — US US RENAL
1 series · 14 of 25 positions shown · non-contrast
Comparison: 11/22/2013, CT 07/04/2020

CLINICAL DATA: Acute renal insufficiency

EXAM:
RENAL / URINARY TRACT ULTRASOUND COMPLETE

[Series 1: us renal · 0.17mm/px · 14 of 49 slices shown]
[im 1/49]
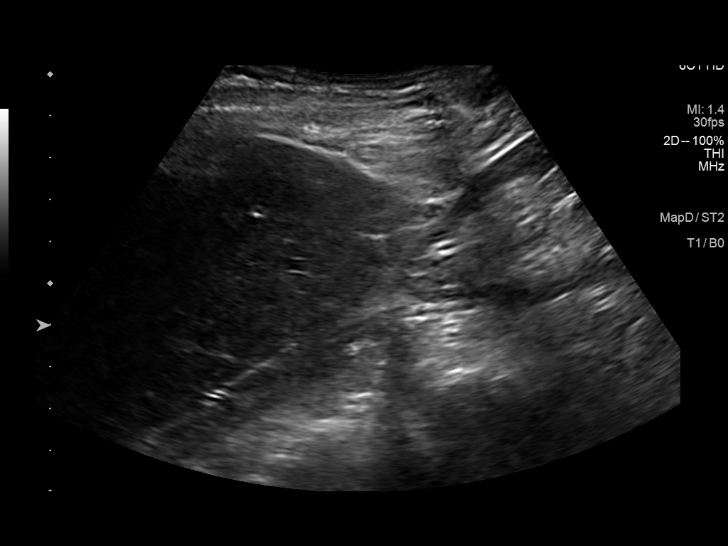
[im 5/49]
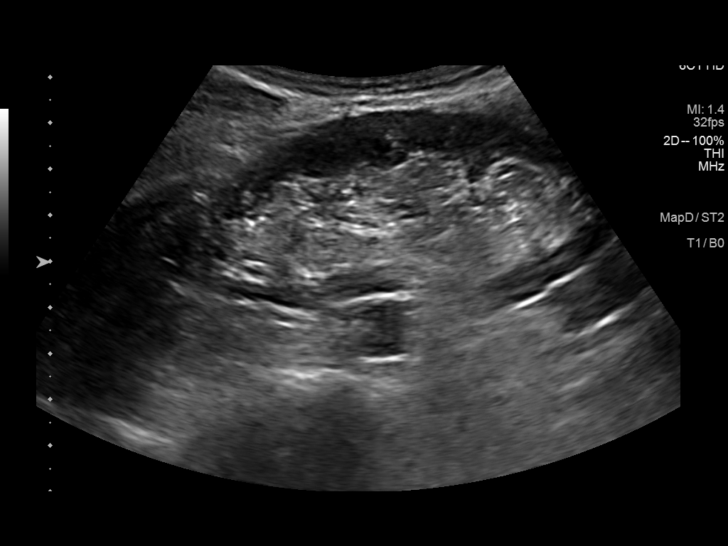
[im 9/49]
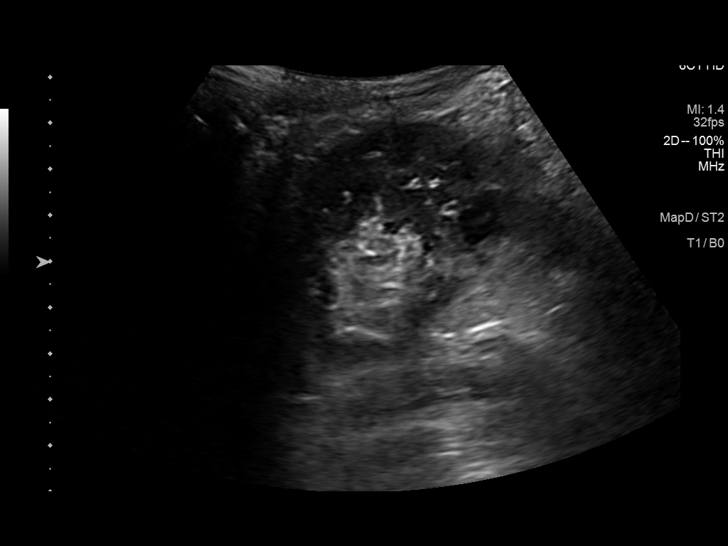
[im 13/49]
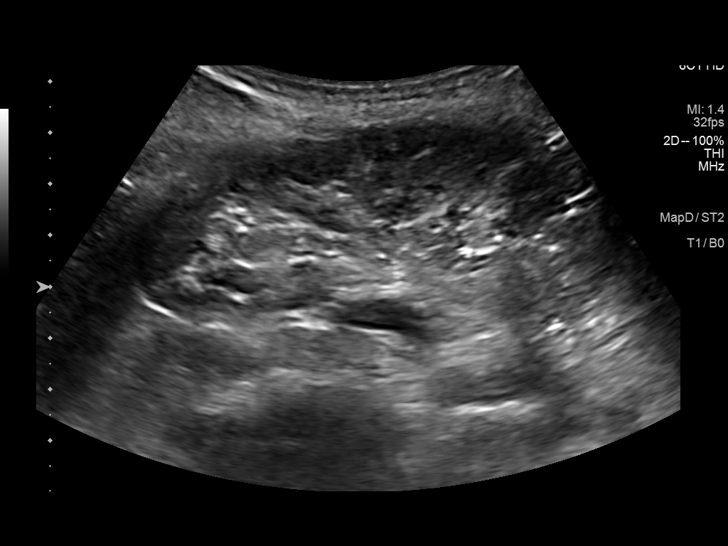
[im 17/49]
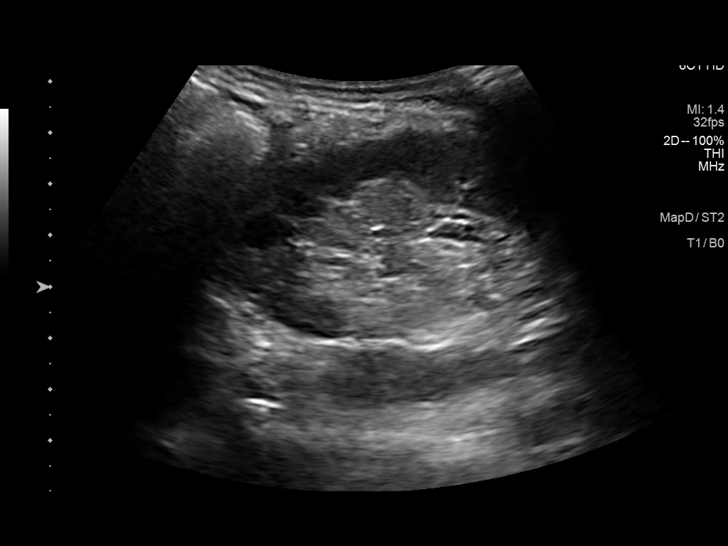
[im 19/49]
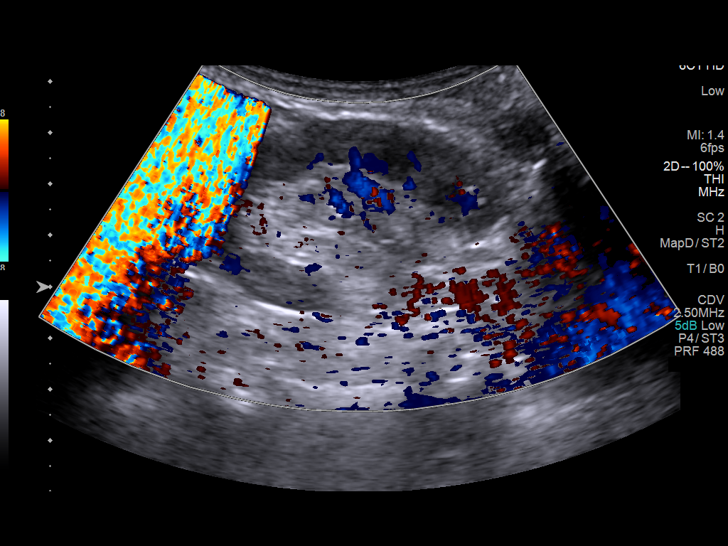
[im 23/49]
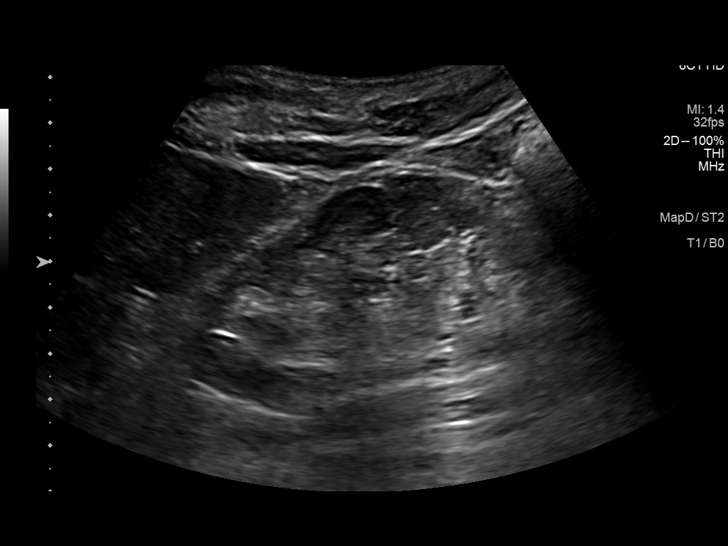
[im 27/49]
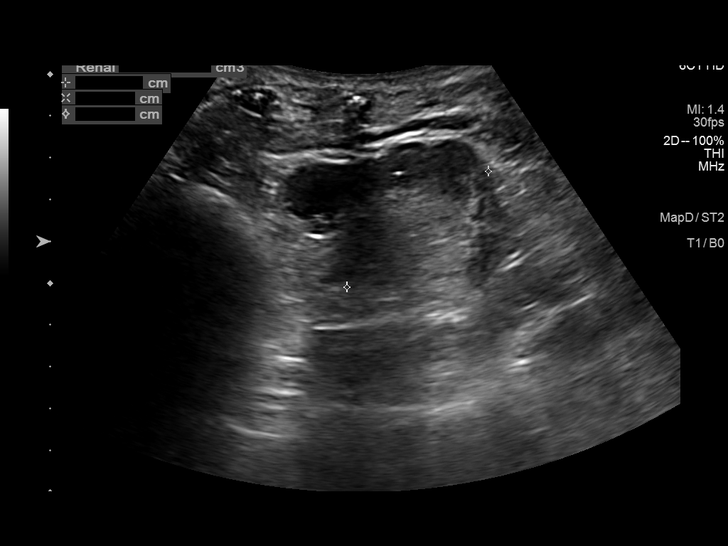
[im 31/49]
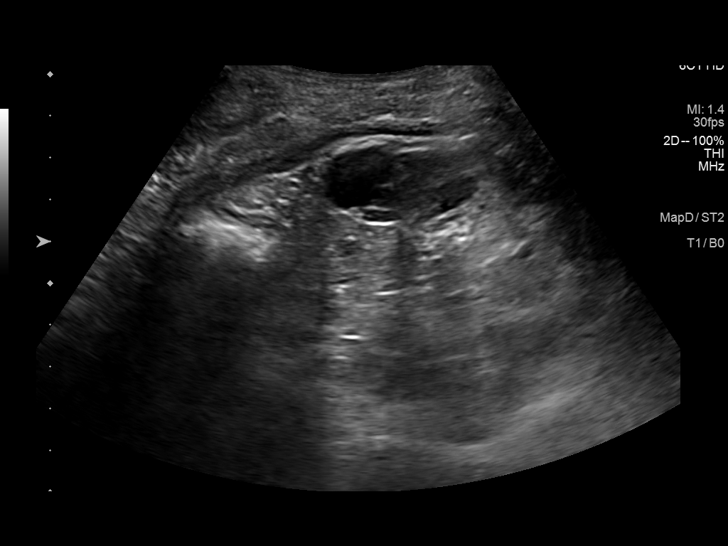
[im 33/49]
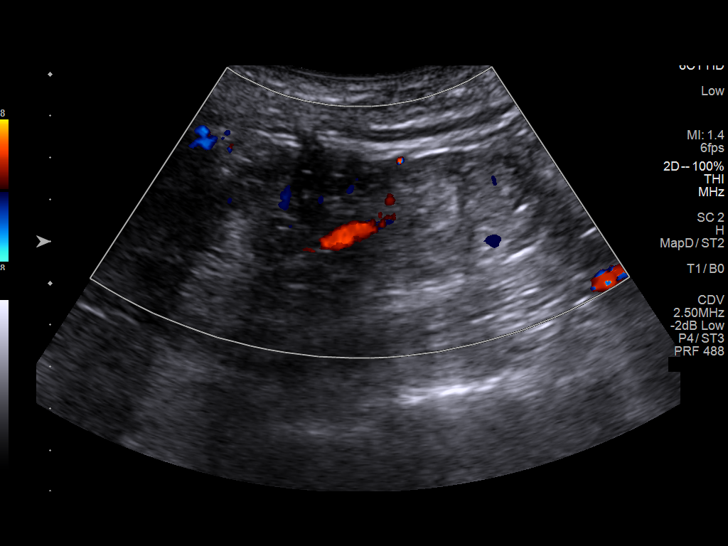
[im 37/49]
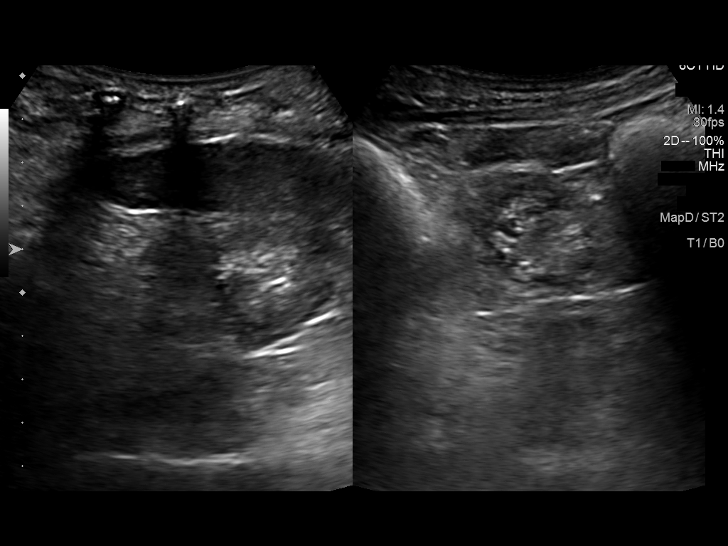
[im 41/49]
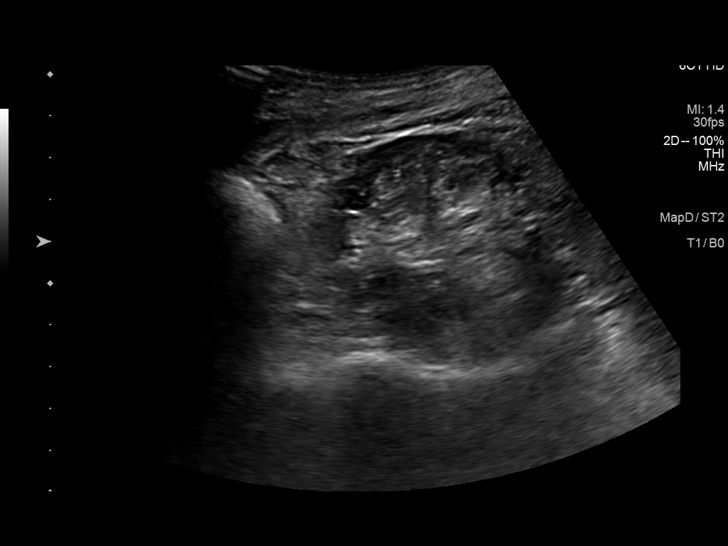
[im 45/49]
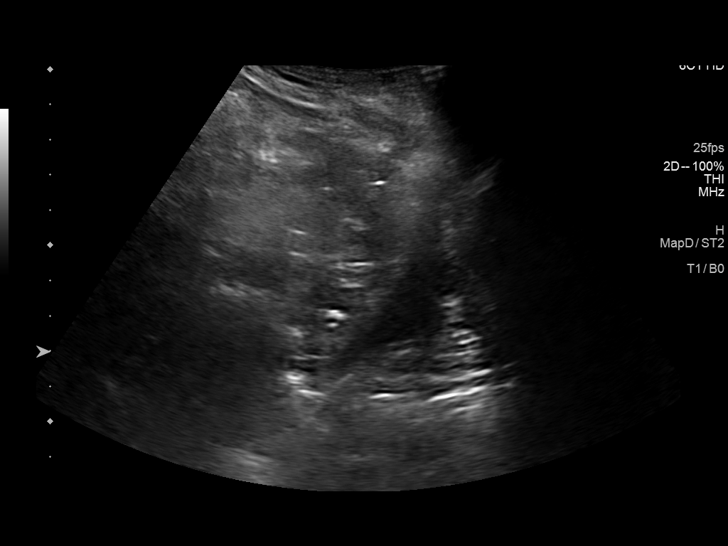
[im 49/49]
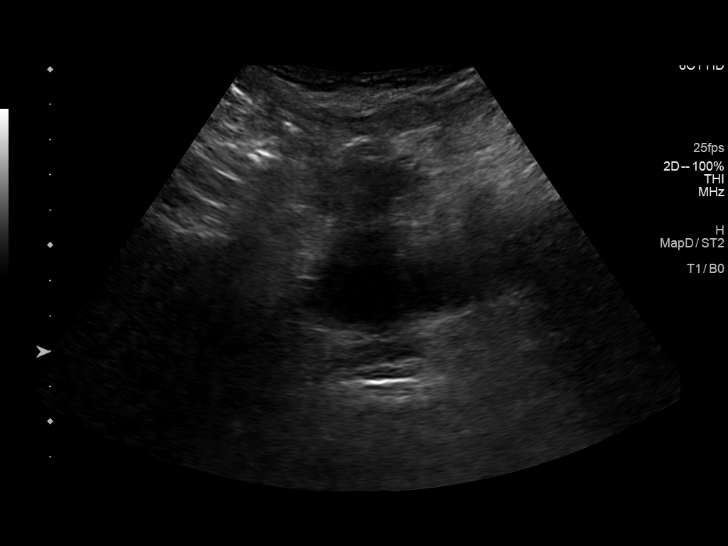

[14 of 25 positions shown; findings below may reference images not displayed]

FINDINGS: Right Kidney:

Renal measurements: 9.8 x 4.6 x 4.8 cm = volume: 113 mL. Cortical
echogenicity within normal limits. No hydronephrosis. Mild cortical
thinning at the poles. Small cyst at the midpole measuring 1.4 cm.

Left Kidney:

Renal measurements: 10.6 x 3.9 x 4.4 cm = volume: 94 mL. Cortex is
slightly echogenic. Cortical thinning is present. No hydronephrosis.
Cyst at the midpole measuring 1.7 cm

Bladder:

Appears normal for degree of bladder distention.

Other:

None.
IMPRESSION: 1. Negative for hydronephrosis.
2. Slightly echogenic left kidney with bilateral renal cortical
thinning consistent with medical renal disease
3. Bilateral renal cysts

## 2021-09-28 IMAGING — CT CT ABD-PELV W/O CM
2 of 4 series · 15 of 46 positions shown, 17 images · non-contrast
Comparison: CT the abdomen and pelvis 07/04/2020.

CLINICAL DATA: 87-year-old female with history of abdominal pain
and fever. Diarrhea.

EXAM:
CT ABDOMEN AND PELVIS WITHOUT CONTRAST
TECHNIQUE: Multidetector CT imaging of the abdomen and pelvis was performed
following the standard protocol without IV contrast.

[Series 3: ap without · axial · non-contrast · 0.57mm/px · z∈[+673,+1033]mm · 12 of 80 slices shown, 14 images]
[im 4/80  soft-tissue]
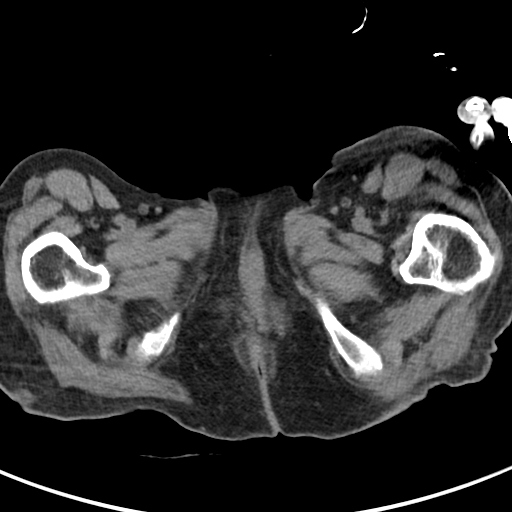
[im 4/80  bone]
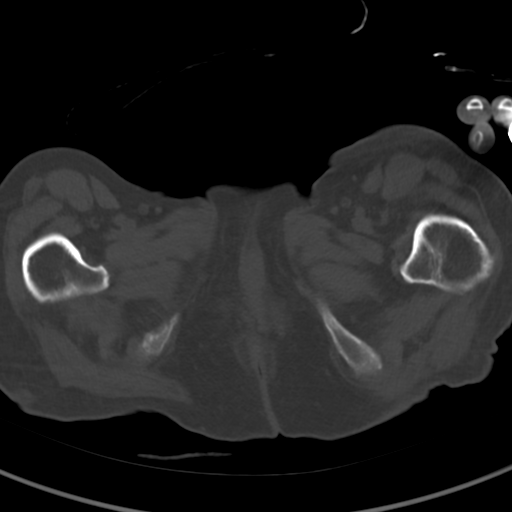
[im 12/80  soft-tissue]
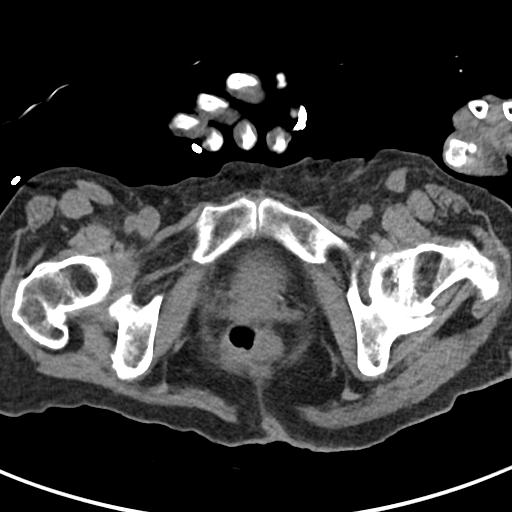
[im 19/80  soft-tissue]
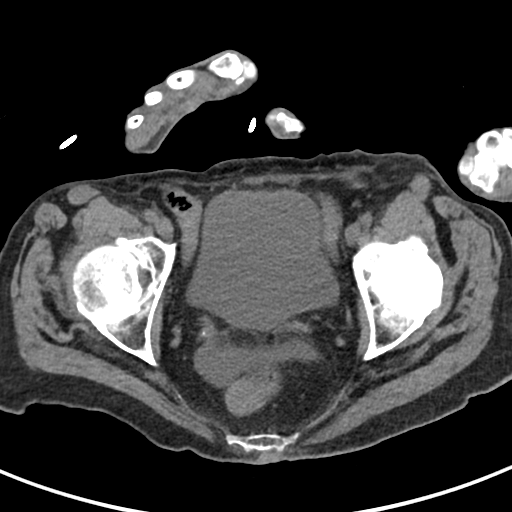
[im 23/80  soft-tissue]
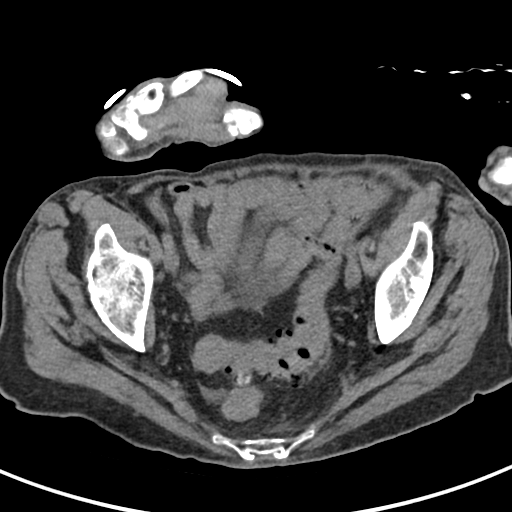
[im 31/80  soft-tissue]
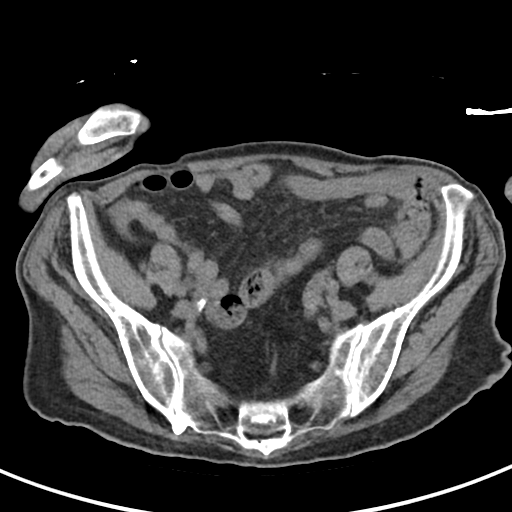
[im 38/80  soft-tissue]
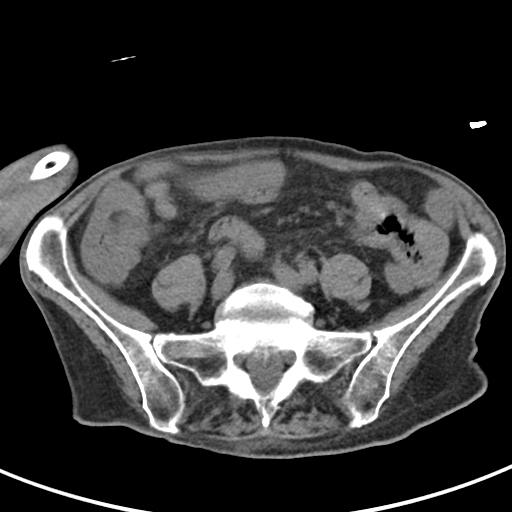
[im 42/80  soft-tissue]
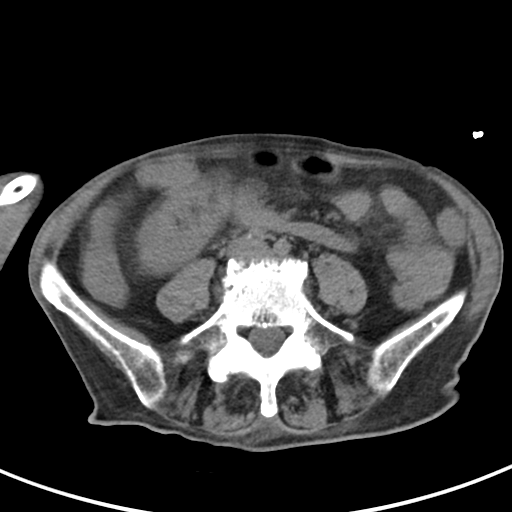
[im 49/80  soft-tissue]
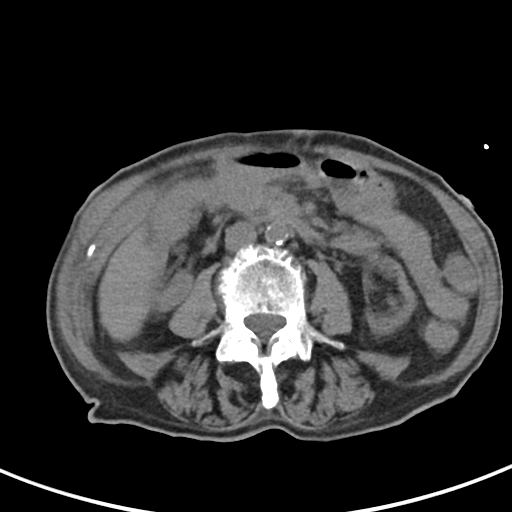
[im 57/80  soft-tissue]
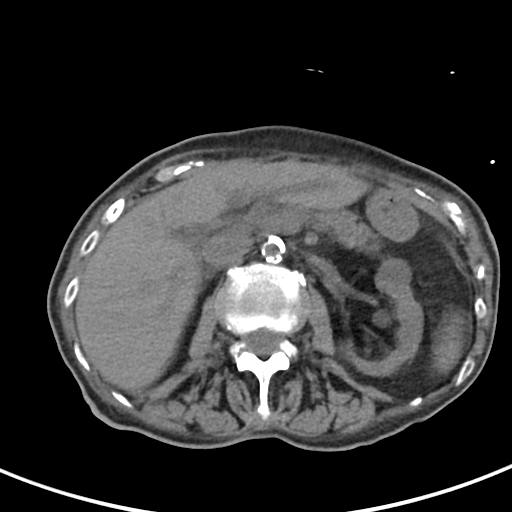
[im 57/80  bone]
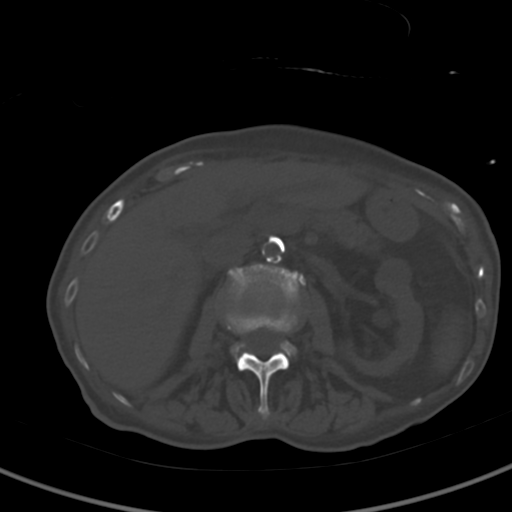
[im 61/80  soft-tissue]
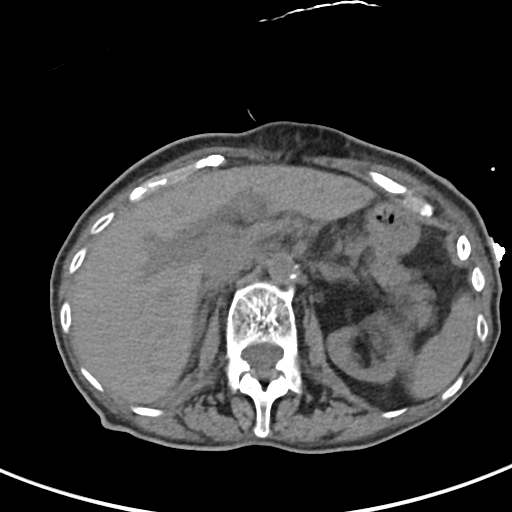
[im 68/80  soft-tissue]
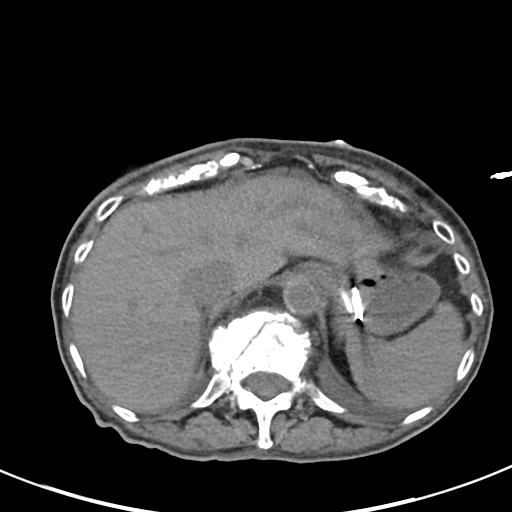
[im 76/80  soft-tissue]
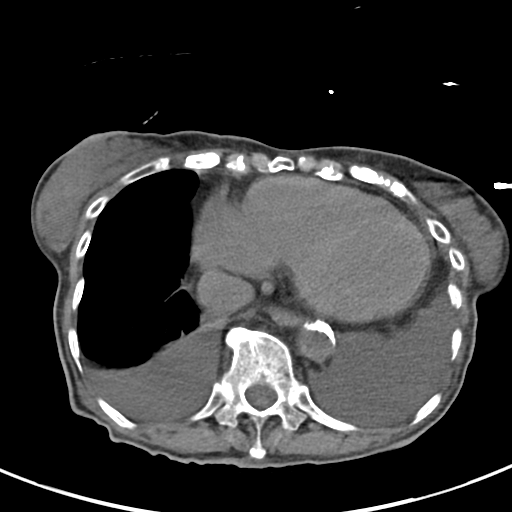

[Series 6: cor · coronal · 0.57mm/px · 3 of 81 slices shown]
[im 27/81  soft-tissue]
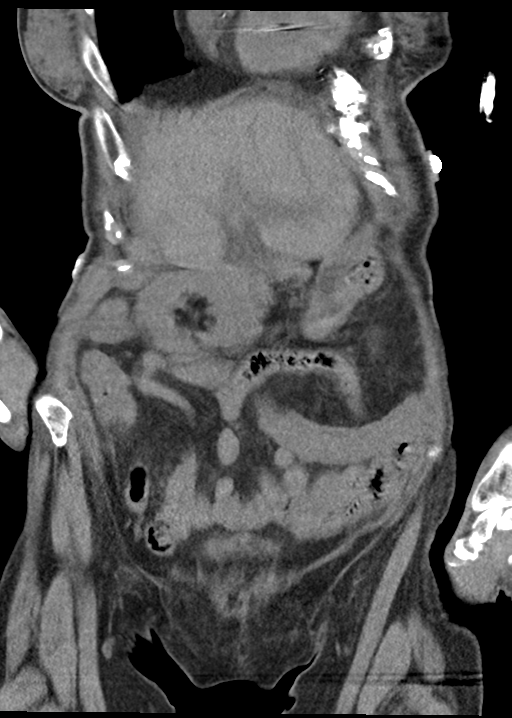
[im 36/81  soft-tissue]
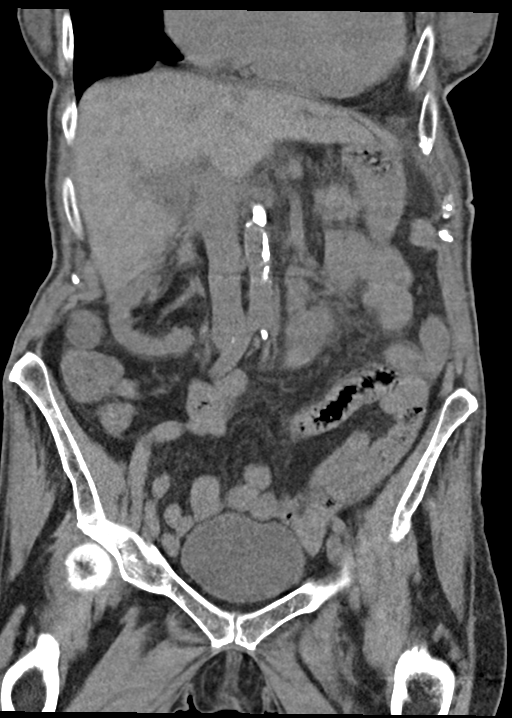
[im 45/81  soft-tissue]
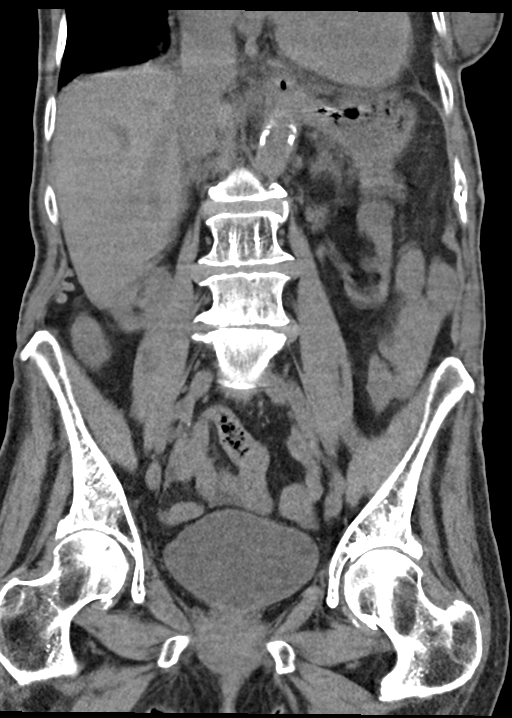

[15 of 46 positions shown; findings below may reference images not displayed]

FINDINGS: Lower chest: Moderate bilateral pleural effusions. Dependent areas
of atelectasis are noted in the lung bases bilaterally. Mild
cardiomegaly. Atherosclerotic calcifications in the descending
thoracic aorta.

Hepatobiliary: No definite suspicious cystic or solid hepatic
lesions are confidently identified on today's noncontrast CT
examination. Gallbladder is not visualized, likely surgically absent
(no surgical clips are noted in the gallbladder fossa).

Pancreas: No definite pancreatic mass or peripancreatic fluid
collections or inflammatory changes.

Spleen: Unremarkable.

Adrenals/Urinary Tract: 2 mm nonobstructive calculus in the
interpolar collecting system of the left kidney. Exophytic 2 cm
low-attenuation lesion in the anterior aspect of the interpolar
region of the left kidney (axial image 24 of series 3) measuring
cm in diameter, incompletely characterized on today's non-contrast
CT examination, but previously characterized as a simple cyst. Mild
parenchymal atrophy in both kidneys. Unenhanced appearance of the
right kidney and bilateral adrenal glands is otherwise normal. No
hydroureteronephrosis. Urinary bladder is normal in appearance.

Stomach/Bowel: Unenhanced appearance of the stomach is normal. No
pathologic dilatation of small bowel or colon. Numerous colonic
diverticulae are noted, particularly in the sigmoid colon, without
surrounding inflammatory changes to suggest an acute diverticulitis
at this time. The appendix is not confidently identified and may be
surgically absent. Regardless, there are no inflammatory changes
noted adjacent to the cecum to suggest the presence of an acute
appendicitis at this time.

Vascular/Lymphatic: Aortic atherosclerosis. No lymphadenopathy noted
in the abdomen or pelvis.

Reproductive: Status post hysterectomy.  Ovaries are trophic.

Other: Small volume of ascites.  No pneumoperitoneum.

Musculoskeletal: There are no aggressive appearing lytic or blastic
lesions noted in the visualized portions of the skeleton. Chronic
compression fracture of L1 with 60% loss of anterior vertebral body
height, similar to the prior study.
IMPRESSION: 1. Small volume of ascites. No other acute findings are noted in the
abdomen or pelvis.
2. Moderate bilateral pleural effusions with bibasilar areas of
atelectasis in the lung bases.
3. 2 mm nonobstructive calculus in the interpolar collecting system
of left kidney.
4. Colonic diverticulosis without definite imaging findings to
suggest an acute diverticulitis at this time.
5. Additional incidental findings, as above.
